# Patient Record
Sex: Male | Born: 1945 | Race: White | Hispanic: No | Marital: Married | Smoking: Never smoker
Health system: Southern US, Community
[De-identification: ages and names within clinical notes are randomized; demographics above are authoritative.]

## PROBLEM LIST (undated history)

## (undated) DIAGNOSIS — C801 Malignant (primary) neoplasm, unspecified: Secondary | ICD-10-CM

## (undated) DIAGNOSIS — F419 Anxiety disorder, unspecified: Secondary | ICD-10-CM

## (undated) DIAGNOSIS — G4730 Sleep apnea, unspecified: Secondary | ICD-10-CM

## (undated) DIAGNOSIS — I2089 Other forms of angina pectoris: Secondary | ICD-10-CM

## (undated) DIAGNOSIS — I2510 Atherosclerotic heart disease of native coronary artery without angina pectoris: Secondary | ICD-10-CM

## (undated) DIAGNOSIS — E669 Obesity, unspecified: Secondary | ICD-10-CM

## (undated) DIAGNOSIS — K859 Acute pancreatitis without necrosis or infection, unspecified: Secondary | ICD-10-CM

## (undated) DIAGNOSIS — K219 Gastro-esophageal reflux disease without esophagitis: Secondary | ICD-10-CM

## (undated) DIAGNOSIS — R911 Solitary pulmonary nodule: Secondary | ICD-10-CM

## (undated) DIAGNOSIS — T7840XA Allergy, unspecified, initial encounter: Secondary | ICD-10-CM

## (undated) DIAGNOSIS — I10 Essential (primary) hypertension: Secondary | ICD-10-CM

## (undated) DIAGNOSIS — G5600 Carpal tunnel syndrome, unspecified upper limb: Secondary | ICD-10-CM

## (undated) DIAGNOSIS — E785 Hyperlipidemia, unspecified: Secondary | ICD-10-CM

## (undated) DIAGNOSIS — I208 Other forms of angina pectoris: Secondary | ICD-10-CM

## (undated) HISTORY — DX: Essential (primary) hypertension: I10

## (undated) HISTORY — DX: Obesity, unspecified: E66.9

## (undated) HISTORY — PX: CARPAL TUNNEL RELEASE: SHX101

## (undated) HISTORY — PX: SHOULDER SURGERY: SHX246

## (undated) HISTORY — DX: Other forms of angina pectoris: I20.89

## (undated) HISTORY — DX: Hyperlipidemia, unspecified: E78.5

## (undated) HISTORY — DX: Atherosclerotic heart disease of native coronary artery without angina pectoris: I25.10

## (undated) HISTORY — PX: APPENDECTOMY: SHX54

## (undated) HISTORY — DX: Other forms of angina pectoris: I20.8

## (undated) HISTORY — DX: Allergy, unspecified, initial encounter: T78.40XA

## (undated) HISTORY — DX: Carpal tunnel syndrome, unspecified upper limb: G56.00

## (undated) HISTORY — PX: TONSILLECTOMY: SUR1361

## (undated) HISTORY — DX: Solitary pulmonary nodule: R91.1

---

## 2004-08-20 ENCOUNTER — Emergency Department: Payer: Self-pay | Admitting: Emergency Medicine

## 2004-09-08 ENCOUNTER — Ambulatory Visit: Payer: Self-pay | Admitting: Orthopaedic Surgery

## 2004-09-18 ENCOUNTER — Ambulatory Visit: Payer: Self-pay | Admitting: Orthopaedic Surgery

## 2009-07-08 ENCOUNTER — Ambulatory Visit: Payer: Self-pay | Admitting: General Surgery

## 2010-05-04 ENCOUNTER — Ambulatory Visit: Payer: Self-pay | Admitting: Otolaryngology

## 2010-05-12 ENCOUNTER — Ambulatory Visit: Payer: Self-pay | Admitting: Otolaryngology

## 2010-06-08 ENCOUNTER — Ambulatory Visit: Payer: Self-pay | Admitting: Internal Medicine

## 2012-03-10 ENCOUNTER — Emergency Department: Payer: Self-pay | Admitting: Emergency Medicine

## 2012-03-10 LAB — URINALYSIS, COMPLETE
Bacteria: NONE SEEN
Bilirubin,UR: NEGATIVE
Glucose,UR: NEGATIVE mg/dL (ref 0–75)
Hyaline Cast: 1
Leukocyte Esterase: NEGATIVE
Nitrite: NEGATIVE
Ph: 6 (ref 4.5–8.0)
Protein: NEGATIVE
RBC,UR: 2 /HPF (ref 0–5)
Specific Gravity: 1.011 (ref 1.003–1.030)
Squamous Epithelial: NONE SEEN
WBC UR: <1 /HPF (ref 0–5)

## 2012-03-10 LAB — CBC
HCT: 41.4 % (ref 40.0–52.0)
HGB: 14.6 g/dL (ref 13.0–18.0)
MCH: 33 pg (ref 26.0–34.0)
MCHC: 35.3 g/dL (ref 32.0–36.0)
MCV: 93 fL (ref 80–100)
Platelet: 274 10*3/uL (ref 150–440)
RBC: 4.44 10*6/uL (ref 4.40–5.90)
RDW: 14.5 % (ref 11.5–14.5)
WBC: 8.6 10*3/uL (ref 3.8–10.6)

## 2012-03-10 LAB — COMPREHENSIVE METABOLIC PANEL
Albumin: 4.2 g/dL (ref 3.4–5.0)
Alkaline Phosphatase: 74 U/L (ref 50–136)
Anion Gap: 11 (ref 7–16)
BUN: 14 mg/dL (ref 7–18)
Bilirubin,Total: 0.5 mg/dL (ref 0.2–1.0)
Calcium, Total: 9.4 mg/dL (ref 8.5–10.1)
Chloride: 96 mmol/L — ABNORMAL LOW (ref 98–107)
Co2: 25 mmol/L (ref 21–32)
Creatinine: 0.82 mg/dL (ref 0.60–1.30)
EGFR (African American): >60
EGFR (Non-African Amer.): >60
Glucose: 115 mg/dL — ABNORMAL HIGH (ref 65–99)
Osmolality: 266 (ref 275–301)
Potassium: 4.3 mmol/L (ref 3.5–5.1)
SGOT(AST): 30 U/L (ref 15–37)
SGPT (ALT): 18 U/L (ref 12–78)
Sodium: 132 mmol/L — ABNORMAL LOW (ref 136–145)
Total Protein: 8.4 g/dL — ABNORMAL HIGH (ref 6.4–8.2)

## 2012-03-10 LAB — TROPONIN I: Troponin-I: <0.02 ng/mL

## 2013-06-04 DIAGNOSIS — G4733 Obstructive sleep apnea (adult) (pediatric): Secondary | ICD-10-CM | POA: Diagnosis not present

## 2013-06-25 DIAGNOSIS — K859 Acute pancreatitis without necrosis or infection, unspecified: Secondary | ICD-10-CM

## 2013-06-25 HISTORY — DX: Acute pancreatitis without necrosis or infection, unspecified: K85.90

## 2013-07-10 DIAGNOSIS — Z789 Other specified health status: Secondary | ICD-10-CM | POA: Diagnosis not present

## 2013-07-10 DIAGNOSIS — I129 Hypertensive chronic kidney disease with stage 1 through stage 4 chronic kidney disease, or unspecified chronic kidney disease: Secondary | ICD-10-CM | POA: Diagnosis not present

## 2013-07-10 DIAGNOSIS — N181 Chronic kidney disease, stage 1: Secondary | ICD-10-CM | POA: Diagnosis not present

## 2013-07-10 DIAGNOSIS — J019 Acute sinusitis, unspecified: Secondary | ICD-10-CM | POA: Diagnosis not present

## 2013-09-26 ENCOUNTER — Inpatient Hospital Stay: Payer: Self-pay | Admitting: Internal Medicine

## 2013-09-26 DIAGNOSIS — R112 Nausea with vomiting, unspecified: Secondary | ICD-10-CM | POA: Diagnosis not present

## 2013-09-26 DIAGNOSIS — R109 Unspecified abdominal pain: Secondary | ICD-10-CM | POA: Diagnosis not present

## 2013-09-26 DIAGNOSIS — N179 Acute kidney failure, unspecified: Secondary | ICD-10-CM | POA: Diagnosis not present

## 2013-09-26 DIAGNOSIS — K859 Acute pancreatitis without necrosis or infection, unspecified: Secondary | ICD-10-CM | POA: Diagnosis not present

## 2013-09-26 DIAGNOSIS — R1084 Generalized abdominal pain: Secondary | ICD-10-CM | POA: Diagnosis not present

## 2013-09-26 DIAGNOSIS — I10 Essential (primary) hypertension: Secondary | ICD-10-CM | POA: Diagnosis not present

## 2013-09-26 LAB — CBC WITH DIFFERENTIAL/PLATELET
Basophil #: 0 10*3/uL (ref 0.0–0.1)
Basophil %: 0.2 %
Eosinophil #: 0 10*3/uL (ref 0.0–0.7)
Eosinophil %: 0.1 %
HCT: 44.2 % (ref 40.0–52.0)
HGB: 15.1 g/dL (ref 13.0–18.0)
Lymphocyte #: 0.2 10*3/uL — ABNORMAL LOW (ref 1.0–3.6)
Lymphocyte %: 1.1 %
MCH: 32.7 pg (ref 26.0–34.0)
MCHC: 34.2 g/dL (ref 32.0–36.0)
MCV: 96 fL (ref 80–100)
Monocyte #: 0.6 x10 3/mm (ref 0.2–1.0)
Monocyte %: 4.3 %
Neutrophil #: 14.1 10*3/uL — ABNORMAL HIGH (ref 1.4–6.5)
Neutrophil %: 94.3 %
Platelet: 143 10*3/uL — ABNORMAL LOW (ref 150–440)
RBC: 4.62 10*6/uL (ref 4.40–5.90)
RDW: 15.3 % — ABNORMAL HIGH (ref 11.5–14.5)
WBC: 14.9 10*3/uL — ABNORMAL HIGH (ref 3.8–10.6)

## 2013-09-26 LAB — COMPREHENSIVE METABOLIC PANEL
Albumin: 4 g/dL (ref 3.4–5.0)
Alkaline Phosphatase: 69 U/L
Anion Gap: 16 (ref 7–16)
BUN: 21 mg/dL — ABNORMAL HIGH (ref 7–18)
Bilirubin,Total: 0.8 mg/dL (ref 0.2–1.0)
Calcium, Total: 8.7 mg/dL (ref 8.5–10.1)
Chloride: 97 mmol/L — ABNORMAL LOW (ref 98–107)
Co2: 19 mmol/L — ABNORMAL LOW (ref 21–32)
Creatinine: 1.42 mg/dL — ABNORMAL HIGH (ref 0.60–1.30)
EGFR (African American): 60 — ABNORMAL LOW
EGFR (Non-African Amer.): 51 — ABNORMAL LOW
Glucose: 143 mg/dL — ABNORMAL HIGH (ref 65–99)
Osmolality: 270 (ref 275–301)
Potassium: 3.8 mmol/L (ref 3.5–5.1)
SGOT(AST): 50 U/L — ABNORMAL HIGH (ref 15–37)
SGPT (ALT): 22 U/L (ref 12–78)
Sodium: 132 mmol/L — ABNORMAL LOW (ref 136–145)
Total Protein: 8.3 g/dL — ABNORMAL HIGH (ref 6.4–8.2)

## 2013-09-26 LAB — TROPONIN I: Troponin-I: <0.02 ng/mL

## 2013-09-26 LAB — LIPASE, BLOOD: Lipase: 1926 U/L — ABNORMAL HIGH (ref 73–393)

## 2013-09-27 DIAGNOSIS — N179 Acute kidney failure, unspecified: Secondary | ICD-10-CM | POA: Diagnosis not present

## 2013-09-27 DIAGNOSIS — I10 Essential (primary) hypertension: Secondary | ICD-10-CM | POA: Diagnosis not present

## 2013-09-27 DIAGNOSIS — K859 Acute pancreatitis without necrosis or infection, unspecified: Secondary | ICD-10-CM | POA: Diagnosis not present

## 2013-09-27 LAB — BASIC METABOLIC PANEL
Anion Gap: 8 (ref 7–16)
BUN: 16 mg/dL (ref 7–18)
Calcium, Total: 7.6 mg/dL — ABNORMAL LOW (ref 8.5–10.1)
Chloride: 100 mmol/L (ref 98–107)
Co2: 26 mmol/L (ref 21–32)
Creatinine: 1 mg/dL (ref 0.60–1.30)
EGFR (African American): >60
EGFR (Non-African Amer.): >60
Glucose: 144 mg/dL — ABNORMAL HIGH (ref 65–99)
Osmolality: 272 (ref 275–301)
Potassium: 3.6 mmol/L (ref 3.5–5.1)
Sodium: 134 mmol/L — ABNORMAL LOW (ref 136–145)

## 2013-09-27 LAB — LIPID PANEL
Cholesterol: 83 mg/dL (ref 0–200)
HDL Cholesterol: 28 mg/dL — ABNORMAL LOW (ref 40–60)
Ldl Cholesterol, Calc: 17 mg/dL (ref 0–100)
Triglycerides: 188 mg/dL (ref 0–200)
VLDL Cholesterol, Calc: 38 mg/dL (ref 5–40)

## 2013-09-27 LAB — CBC WITH DIFFERENTIAL/PLATELET
Basophil #: 0 10*3/uL (ref 0.0–0.1)
Basophil %: 0.1 %
Eosinophil #: 0 10*3/uL (ref 0.0–0.7)
Eosinophil %: 0.1 %
HCT: 39.6 % — ABNORMAL LOW (ref 40.0–52.0)
HGB: 13.8 g/dL (ref 13.0–18.0)
Lymphocyte #: 0.3 10*3/uL — ABNORMAL LOW (ref 1.0–3.6)
Lymphocyte %: 2 %
MCH: 33 pg (ref 26.0–34.0)
MCHC: 34.8 g/dL (ref 32.0–36.0)
MCV: 95 fL (ref 80–100)
Monocyte #: 0.5 x10 3/mm (ref 0.2–1.0)
Monocyte %: 3.4 %
Neutrophil #: 13.8 10*3/uL — ABNORMAL HIGH (ref 1.4–6.5)
Neutrophil %: 94.4 %
Platelet: 143 10*3/uL — ABNORMAL LOW (ref 150–440)
RBC: 4.18 10*6/uL — ABNORMAL LOW (ref 4.40–5.90)
RDW: 15.1 % — ABNORMAL HIGH (ref 11.5–14.5)
WBC: 14.6 10*3/uL — ABNORMAL HIGH (ref 3.8–10.6)

## 2013-09-27 LAB — LIPASE, BLOOD: Lipase: 589 U/L — ABNORMAL HIGH (ref 73–393)

## 2013-09-28 DIAGNOSIS — N179 Acute kidney failure, unspecified: Secondary | ICD-10-CM | POA: Diagnosis not present

## 2013-09-28 DIAGNOSIS — R112 Nausea with vomiting, unspecified: Secondary | ICD-10-CM | POA: Diagnosis not present

## 2013-09-28 DIAGNOSIS — K859 Acute pancreatitis without necrosis or infection, unspecified: Secondary | ICD-10-CM | POA: Diagnosis not present

## 2013-09-28 DIAGNOSIS — I10 Essential (primary) hypertension: Secondary | ICD-10-CM | POA: Diagnosis not present

## 2013-09-28 LAB — LIPASE, BLOOD: Lipase: 181 U/L (ref 73–393)

## 2013-10-20 DIAGNOSIS — K859 Acute pancreatitis without necrosis or infection, unspecified: Secondary | ICD-10-CM | POA: Diagnosis not present

## 2013-10-20 DIAGNOSIS — Z1389 Encounter for screening for other disorder: Secondary | ICD-10-CM | POA: Diagnosis not present

## 2013-10-20 DIAGNOSIS — K5732 Diverticulitis of large intestine without perforation or abscess without bleeding: Secondary | ICD-10-CM | POA: Diagnosis not present

## 2013-10-28 DIAGNOSIS — K5732 Diverticulitis of large intestine without perforation or abscess without bleeding: Secondary | ICD-10-CM | POA: Diagnosis not present

## 2013-10-28 DIAGNOSIS — I10 Essential (primary) hypertension: Secondary | ICD-10-CM | POA: Diagnosis not present

## 2013-10-28 DIAGNOSIS — K859 Acute pancreatitis without necrosis or infection, unspecified: Secondary | ICD-10-CM | POA: Diagnosis not present

## 2013-12-10 DIAGNOSIS — J209 Acute bronchitis, unspecified: Secondary | ICD-10-CM | POA: Diagnosis not present

## 2013-12-11 ENCOUNTER — Ambulatory Visit (INDEPENDENT_AMBULATORY_CARE_PROVIDER_SITE_OTHER): Payer: Medicare Other | Admitting: Podiatry

## 2013-12-11 ENCOUNTER — Other Ambulatory Visit: Payer: Self-pay | Admitting: *Deleted

## 2013-12-11 ENCOUNTER — Ambulatory Visit (INDEPENDENT_AMBULATORY_CARE_PROVIDER_SITE_OTHER): Payer: Medicare Other

## 2013-12-11 ENCOUNTER — Encounter: Payer: Self-pay | Admitting: Podiatry

## 2013-12-11 VITALS — BP 131/76 | HR 65 | Resp 16 | Ht 66.0 in | Wt 200.0 lb

## 2013-12-11 DIAGNOSIS — M722 Plantar fascial fibromatosis: Secondary | ICD-10-CM | POA: Diagnosis not present

## 2013-12-11 NOTE — Patient Instructions (Signed)

## 2013-12-11 NOTE — Progress Notes (Signed)
   Subjective:    Patient ID: Daniel Holden, male    DOB: 12-Dec-1945, 68 y.o.   MRN: [id number]  HPI Comments: Been getting a pin prick feeling at the bottom of the left heel, it does not happen all the time, and it seems to happen when going to sit down and turn the foot a certain way.   Foot Pain      Review of Systems  All other systems reviewed and are negative.      Objective:   Physical Exam        Assessment & Plan:

## 2013-12-11 NOTE — Progress Notes (Signed)
Subjective:     Patient ID: Daniel Holden, male   DOB: 24-Dec-1945, 68 y.o.   MRN: [id number]  Foot Pain   patient presents stating I have had some sharp pains in the bottom of my left heel that feels like a pinprick and I am worried that this may be heel pain or it may be related to my back   Review of Systems  All other systems reviewed and are negative.      Objective:   Physical Exam  Nursing note and vitals reviewed. Constitutional: He is oriented to person, place, and time.  Cardiovascular: Intact distal pulses.   Musculoskeletal: Normal range of motion.  Neurological: He is oriented to person, place, and time.  Skin: Skin is warm.   neurovascular status found to be intact with range of motion subtalar midtarsal joint adequate and no muscle strength loss noted in either foot. I was unable to elicit discomfort in the plantar left heel and I did find the digits are well-perfused and it DTR reflexes are intact    Assessment:     Possible low grade plantar fasciitis left versus possibility for mild impingement in the lower back creating symptoms    Plan:     H&P and x-ray reviewed. I do think at this time we will treat conservatively with supportive shoe gear usage and stretching exercises and if symptoms persist and he continues to be a sharp burning-type pain I want him to get an MRI of his lower back to rule out any kind of nerve compression. Reappoint as needed

## 2014-01-21 DIAGNOSIS — G4733 Obstructive sleep apnea (adult) (pediatric): Secondary | ICD-10-CM | POA: Diagnosis not present

## 2014-01-21 DIAGNOSIS — H905 Unspecified sensorineural hearing loss: Secondary | ICD-10-CM | POA: Diagnosis not present

## 2014-01-21 DIAGNOSIS — H903 Sensorineural hearing loss, bilateral: Secondary | ICD-10-CM | POA: Diagnosis not present

## 2014-01-21 DIAGNOSIS — H6120 Impacted cerumen, unspecified ear: Secondary | ICD-10-CM | POA: Diagnosis not present

## 2014-06-03 DIAGNOSIS — K5732 Diverticulitis of large intestine without perforation or abscess without bleeding: Secondary | ICD-10-CM | POA: Diagnosis not present

## 2014-06-03 DIAGNOSIS — Z1389 Encounter for screening for other disorder: Secondary | ICD-10-CM | POA: Diagnosis not present

## 2014-06-29 DIAGNOSIS — M5127 Other intervertebral disc displacement, lumbosacral region: Secondary | ICD-10-CM | POA: Diagnosis not present

## 2014-06-29 DIAGNOSIS — M549 Dorsalgia, unspecified: Secondary | ICD-10-CM | POA: Diagnosis not present

## 2014-06-29 DIAGNOSIS — M539 Dorsopathy, unspecified: Secondary | ICD-10-CM | POA: Diagnosis not present

## 2014-09-10 DIAGNOSIS — M25562 Pain in left knee: Secondary | ICD-10-CM | POA: Diagnosis not present

## 2014-09-10 DIAGNOSIS — I739 Peripheral vascular disease, unspecified: Secondary | ICD-10-CM | POA: Diagnosis not present

## 2014-09-21 DIAGNOSIS — M25562 Pain in left knee: Secondary | ICD-10-CM | POA: Diagnosis not present

## 2014-10-16 NOTE — Discharge Summary (Signed)
PATIENT NAME:  Daniel Holden, Daniel Holden MR#:  [id number] DATE OF BIRTH:  [birth year]  DATE OF ADMISSION:  09/26/2013 DATE OF DISCHARGE:  09/28/2013  ADMITTING DIAGNOSIS: Abdominal pain, nausea, vomiting,   DISCHARGE DIAGNOSES: 1. Abdominal pain, nausea, vomiting due to acute pancreatitis likely alcohol induced, HCTZ therapy could also be the cause. The patient's ultrasound of the abdomen was nonrevealing.  2. Alcohol abuse. The patient counseled regarding cessation of alcohol abuse.  3. Hypertension.  4. Acute kidney injury: Likely due to dehydration, resolved with IV fluids.  5. Hyponatremia, likely due to dehydration, improved with IV fluids.  6. Leukocytosis, likely related to pancreatitis.   CONSULTANTS: None.   PERTINENT LABORATORIES AND EVALUATIONS: Admitting glucose 143, BUN 21, creatinine 1.42, sodium 132, potassium 3.8, chloride 97. CO2 is 19, calcium was 8.6. Lipase was 1926 on admission. Discharge was 181. LFTs were normal except slightly elevated AST of 50, troponin less than 0.02. WBC 14.9, hemoglobin 15.1, platelet count was 142. EKG normal sinus rhythm with possible left atrial enlargement. Ultrasound of the abdomen showed increased liver echogenicity probably indicating fatty changes with no focal liver lesions identified.   HOSPITAL COURSE: Please refer to H and P done by the admitting physician. The patient is a 69 year old white male with history of alcohol abuse, presented with complaint of acute onset of abdominal pain. The patient was admitted and underwent a work-up with ultrasound, which was negative for any abnormality in his gallbladder of his ducts. The patient's was kept n.p.o. for acute pancreatitis. By day two his pain was improved. By day three his pain resolved.  His lipase was normalized,  his diet was advanced and his symptoms resolved. At this point, he is doing much better and tolerating diet and is stable for discharge. He also was on HCTZ and enalapril, which are both  known to cause pancreatitis, HCTZ more than enalapril, therefore, HCTZ has been discontinued at this point. His blood pressure will need to be monitored and his primary care provider needs to adjust his regimen as felt appropriate. At this time, he is stable for discharge.   DISCHARGE MEDICATIONS: Metoprolol tartrate 50 mg 1 tab p.o. b.i.d., Centrum Silver 1 tab p.o. daily, Xanax 0.25 1 tab p.o. t.i.d. as needed for anxiety, vitamin D3 1000 international units daily, enalapril 20 daily, meclizine 25 mg 1 tab p.o. t.i.d., Tylenol 650 q.4 p.r.n. for pain, omeprazole 20 daily.   DIET: Low-sodium, low-fat, low-cholesterol.   ACTIVITY: As tolerated.   FOLLOW-UP: With Dr. Rebecka Apley in 1 to 2 weeks.   TIME SPENT: 35 minutes on this patient.   ____________________________ Lafonda Mosses. Posey Pronto, MD shp:sg D: 09/29/2013 11:13:24 ET T: 09/29/2013 11:41:43 ET JOB#: [id number]  cc: Ellinor Test H. Posey Pronto, MD, <Dictator> Alric Seton MD ELECTRONICALLY SIGNED 10/07/2013 15:37

## 2014-10-16 NOTE — H&P (Signed)
PATIENT NAME:  Daniel Holden, Daniel Holden MR#:  [id number] DATE OF BIRTH:  1945-08-21  DATE OF ADMISSION:  09/26/2013  PRIMARY CARE PHYSICIAN: Dr. Lavera Guise.  CHIEF COMPLAINT: Abdominal pain, nausea and vomiting.   HISTORY OF PRESENT ILLNESS: This is a 69 year old male who presents to the hospital with sudden onset of abdominal pain, nausea, vomiting that began this morning. He describes the pain as located in the upper part of his abdomen, nonradiating, associated with some vomiting. The patient attempt to take some Pepto-Bismol, but it did not improve his symptoms. He actually vomited out his Pepto-Bismol prior to coming into the Emergency Room. The patient also had 2 episodes of vomiting while being here, which has been consistent with the Pepto-Bismol he drank. The patient denies any fevers or chills, any chest pain, shortness of breath, or any other associated symptoms presently. The patient presented to the Emergency Room and was noted to have a lipase greater than 1900 and diagnosed with acute pancreatitis. Hospitalist services were contacted for further treatment and evaluation.   REVIEW OF SYSTEMS:    CONSTITUTIONAL: No documented fever. No weight gain. No weight loss.  EYES: No blurred or double vision.  EARS, NOSE, THROAT: No tinnitus. No postnasal drip. No redness of the oropharynx.  RESPIRATORY: No cough, no wheeze, no hemoptysis, no dyspnea.  CARDIOVASCULAR: No chest pain, no orthopnea, no palpitations, no syncope.  GASTROINTESTINAL: Positive nausea. Positive vomiting. Positive upper abdominal pain. No melena or hematochezia.  GENITOURINARY: No dysuria or hematuria.  ENDOCRINE: No polyuria, nocturia, heat or cold intolerance. HEMATOLOGIC: No anemia, no bruising, no bleeding.  INTEGUMENTARY: No rashes, no lesions.  MUSCULOSKELETAL: No arthritis. No swelling. No gout.  NEUROLOGIC: No numbness. No tingling. No ataxia. No seizure activity.  PSYCHIATRIC: No anxiety. No insomnia. No ADD.   PAST  MEDICAL HISTORY: Consistent with hypertension.   ALLERGIES: No known drug allergies.   SOCIAL HISTORY: No smoking. Does drink about 3 to 4 beers daily. No illicit drug abuse. Lives with his wife at home.   FAMILY HISTORY: The patient's mother is alive; she is healthy. Father died; he did have a history of asbestosis.   CURRENT MEDICATIONS: Metoprolol tartrate 50 mg b.i.d., hydrochlorothiazide 12.5 mg daily, enalapril 20 mg b.i.d., and Nexium 40 mg daily.   PHYSICAL EXAMINATION: Presently is as follows:  VITAL SIGNS: Temperature 98.1, pulse 95, respirations 30, blood pressure 208/100, sats 97% on room air.  GENERAL: He is a pleasant-appearing male in mild distress.  HEENT: Atraumatic, normocephalic. Extraocular muscles are intact. Pupils equal and reactive to light. Sclerae anicteric. No conjunctival injection. No pharyngeal erythema.  NECK: Supple. There is no jugular venous distention. No bruits, no lymphadenopathy, no thyromegaly.  HEART: Regular rate and rhythm. No murmurs, no rubs, no clicks.  LUNGS: Clear to auscultation bilaterally. No rales, no rhonchi, no wheezes.  ABDOMEN: Soft, flat. Tender in the upper part of the abdomen. No rebound, no rigidity. Hypoactive bowel sounds. No hepatosplenomegaly appreciated.  EXTREMITIES: No evidence of any cyanosis, clubbing, or peripheral edema. Has +2 pedal and radial pulses bilaterally.  NEUROLOGICAL: The patient is alert, awake, and oriented x 3 with no focal motor or sensory deficits appreciated bilaterally.  SKIN: Moist and warm with no rashes appreciated.  LYMPHATIC: There is no cervical or axillary lymphadenopathy.   LABORATORY AND RADIOLOGICAL DATA: Serum glucose of 143, BUN 21, creatinine 1.4, sodium 132, potassium 3.8, chloride 97, bicarbonate 19. Lipase is 1926. LFTs are within normal limits. Troponin less than 0.02. White cell count  14.9, hemoglobin 15.1, hematocrit 44.2, platelet count 143.   The patient did have an abdominal  ultrasound done, which showed some hepatic steatosis but no other biliary pathology.   ASSESSMENT AND PLAN: This is a 69 year old male with history of hypertension, presents to the hospital with abdominal pain, nausea, vomiting and noted to have acute pancreatitis.  1.  Acute pancreatitis: The exact etiology of this is unclear. Suspected to be alcohol abuse, as the patient does not about 3 to 4 beers daily, but that is not a very have dose of alcohol. He has not had any binge drinking in the past few weeks. His abdominal ultrasound shows no evidence of acute biliary process. I will go ahead and check a triglyceride level. The patient is also on HCTZ and enalapril, therefore, some concern if those 2 medications are possibly causing pancreatitis, but he has been on those for many years. For now, I will continue supportive care with IV fluids, antiemetics, pain control with IV Dilaudid. Follow him clinically. Follow his lipase. 2.  Acute renal failure: This is likely secondary to acute pancreatitis and volume depletion. I will hydrate him with IV fluids. Follow BUN and creatinine and urine output. Hold his ACE inhibitor and HCTZ for now.  3.  Leukocytosis: Likely stress margination from his acute pancreatitis. Follow his white cell count. No acute infectious source. 4.  Hypertension. His blood pressure is significantly uncontrolled. Some of this could be related to underlying abdominal pain. I will continue his metoprolol for now. Add some p.r.n. hydralazine. Hold his HCTZ and ACE given his renal failure and possible underlying acute pancreatitis.   CODE STATUS: The patient is a full code.   TIME SPENT ON ADMISSION: 50 minutes.   ____________________________ Belia Heman. [name], MD vjs:jcm D: 09/26/2013 18:13:51 ET T: 09/26/2013 19:06:27 ET JOB#: [id number]  cc: Belia Heman. [name], MD, <Dictator> Henreitta Leber MD ELECTRONICALLY SIGNED 10/04/2013 18:45

## 2014-11-08 DIAGNOSIS — I10 Essential (primary) hypertension: Secondary | ICD-10-CM | POA: Diagnosis not present

## 2014-11-08 DIAGNOSIS — K2900 Acute gastritis without bleeding: Secondary | ICD-10-CM | POA: Diagnosis not present

## 2014-11-08 DIAGNOSIS — K5732 Diverticulitis of large intestine without perforation or abscess without bleeding: Secondary | ICD-10-CM | POA: Diagnosis not present

## 2014-11-24 ENCOUNTER — Other Ambulatory Visit: Payer: Self-pay | Admitting: Orthopedic Surgery

## 2014-11-24 DIAGNOSIS — M25562 Pain in left knee: Secondary | ICD-10-CM

## 2014-11-30 ENCOUNTER — Ambulatory Visit: Payer: Self-pay

## 2014-12-01 ENCOUNTER — Ambulatory Visit
Admission: RE | Admit: 2014-12-01 | Discharge: 2014-12-01 | Disposition: A | Discharge status: Home / Self Care | Payer: Medicare Other | Source: Ambulatory Visit | Attending: Orthopedic Surgery | Admitting: Orthopedic Surgery

## 2014-12-01 DIAGNOSIS — M25562 Pain in left knee: Secondary | ICD-10-CM | POA: Diagnosis present

## 2014-12-01 DIAGNOSIS — M23322 Other meniscus derangements, posterior horn of medial meniscus, left knee: Secondary | ICD-10-CM | POA: Diagnosis not present

## 2014-12-01 DIAGNOSIS — M7122 Synovial cyst of popliteal space [Baker], left knee: Secondary | ICD-10-CM | POA: Insufficient documentation

## 2014-12-01 DIAGNOSIS — M1712 Unilateral primary osteoarthritis, left knee: Secondary | ICD-10-CM | POA: Diagnosis not present

## 2014-12-01 DIAGNOSIS — M25462 Effusion, left knee: Secondary | ICD-10-CM | POA: Diagnosis not present

## 2014-12-01 DIAGNOSIS — S83242A Other tear of medial meniscus, current injury, left knee, initial encounter: Secondary | ICD-10-CM | POA: Diagnosis not present

## 2014-12-01 DIAGNOSIS — M7642 Tibial collateral bursitis [Pellegrini-Stieda], left leg: Secondary | ICD-10-CM | POA: Insufficient documentation

## 2014-12-06 DIAGNOSIS — M25562 Pain in left knee: Secondary | ICD-10-CM | POA: Diagnosis not present

## 2015-07-15 DIAGNOSIS — G4733 Obstructive sleep apnea (adult) (pediatric): Secondary | ICD-10-CM | POA: Diagnosis not present

## 2015-07-15 DIAGNOSIS — E669 Obesity, unspecified: Secondary | ICD-10-CM | POA: Diagnosis not present

## 2015-07-15 DIAGNOSIS — H903 Sensorineural hearing loss, bilateral: Secondary | ICD-10-CM | POA: Diagnosis not present

## 2015-07-15 DIAGNOSIS — J301 Allergic rhinitis due to pollen: Secondary | ICD-10-CM | POA: Diagnosis not present

## 2015-07-29 DIAGNOSIS — R6889 Other general symptoms and signs: Secondary | ICD-10-CM | POA: Diagnosis not present

## 2015-07-29 DIAGNOSIS — Z79899 Other long term (current) drug therapy: Secondary | ICD-10-CM | POA: Diagnosis not present

## 2015-07-29 DIAGNOSIS — Z1329 Encounter for screening for other suspected endocrine disorder: Secondary | ICD-10-CM | POA: Diagnosis not present

## 2015-07-29 DIAGNOSIS — E789 Disorder of lipoprotein metabolism, unspecified: Secondary | ICD-10-CM | POA: Diagnosis not present

## 2016-02-07 DIAGNOSIS — M25512 Pain in left shoulder: Secondary | ICD-10-CM | POA: Diagnosis not present

## 2016-02-09 DIAGNOSIS — I10 Essential (primary) hypertension: Secondary | ICD-10-CM | POA: Diagnosis not present

## 2016-02-09 DIAGNOSIS — K5732 Diverticulitis of large intestine without perforation or abscess without bleeding: Secondary | ICD-10-CM | POA: Diagnosis not present

## 2016-02-09 DIAGNOSIS — G4733 Obstructive sleep apnea (adult) (pediatric): Secondary | ICD-10-CM | POA: Diagnosis not present

## 2016-02-09 DIAGNOSIS — M67912 Unspecified disorder of synovium and tendon, left shoulder: Secondary | ICD-10-CM | POA: Diagnosis not present

## 2016-02-22 DIAGNOSIS — M7552 Bursitis of left shoulder: Secondary | ICD-10-CM | POA: Diagnosis not present

## 2016-02-22 DIAGNOSIS — M67922 Unspecified disorder of synovium and tendon, left upper arm: Secondary | ICD-10-CM | POA: Diagnosis not present

## 2016-02-22 DIAGNOSIS — M25512 Pain in left shoulder: Secondary | ICD-10-CM | POA: Diagnosis not present

## 2016-02-22 DIAGNOSIS — M19012 Primary osteoarthritis, left shoulder: Secondary | ICD-10-CM | POA: Diagnosis not present

## 2016-02-22 DIAGNOSIS — S43492A Other sprain of left shoulder joint, initial encounter: Secondary | ICD-10-CM | POA: Diagnosis not present

## 2016-02-22 DIAGNOSIS — M7582 Other shoulder lesions, left shoulder: Secondary | ICD-10-CM | POA: Diagnosis not present

## 2016-03-13 DIAGNOSIS — S43432D Superior glenoid labrum lesion of left shoulder, subsequent encounter: Secondary | ICD-10-CM | POA: Diagnosis not present

## 2016-03-13 DIAGNOSIS — M7542 Impingement syndrome of left shoulder: Secondary | ICD-10-CM | POA: Diagnosis not present

## 2016-03-13 DIAGNOSIS — M19019 Primary osteoarthritis, unspecified shoulder: Secondary | ICD-10-CM | POA: Diagnosis not present

## 2016-03-19 DIAGNOSIS — Z23 Encounter for immunization: Secondary | ICD-10-CM | POA: Diagnosis not present

## 2016-06-09 ENCOUNTER — Encounter: Payer: Self-pay | Admitting: Emergency Medicine

## 2016-06-09 ENCOUNTER — Emergency Department
Admission: EM | Admit: 2016-06-09 | Discharge: 2016-06-09 | Disposition: A | Discharge status: Home / Self Care | Payer: Medicare Other | Attending: Emergency Medicine | Admitting: Emergency Medicine

## 2016-06-09 DIAGNOSIS — E876 Hypokalemia: Secondary | ICD-10-CM | POA: Diagnosis not present

## 2016-06-09 DIAGNOSIS — I10 Essential (primary) hypertension: Secondary | ICD-10-CM | POA: Insufficient documentation

## 2016-06-09 DIAGNOSIS — Z79899 Other long term (current) drug therapy: Secondary | ICD-10-CM | POA: Diagnosis not present

## 2016-06-09 DIAGNOSIS — K852 Alcohol induced acute pancreatitis without necrosis or infection: Secondary | ICD-10-CM | POA: Diagnosis not present

## 2016-06-09 DIAGNOSIS — R1013 Epigastric pain: Secondary | ICD-10-CM | POA: Diagnosis not present

## 2016-06-09 DIAGNOSIS — R109 Unspecified abdominal pain: Secondary | ICD-10-CM | POA: Diagnosis not present

## 2016-06-09 HISTORY — DX: Acute pancreatitis without necrosis or infection, unspecified: K85.90

## 2016-06-09 LAB — CBC WITH DIFFERENTIAL/PLATELET
Basophils Absolute: 0 10*3/uL (ref 0–0.1)
Basophils Relative: 0 %
Eosinophils Absolute: 0 10*3/uL (ref 0–0.7)
Eosinophils Relative: 0 %
HCT: 38.4 % — ABNORMAL LOW (ref 40.0–52.0)
Hemoglobin: 13.9 g/dL (ref 13.0–18.0)
Lymphocytes Relative: 2 %
Lymphs Abs: 0.2 10*3/uL — ABNORMAL LOW (ref 1.0–3.6)
MCH: 35.4 pg — ABNORMAL HIGH (ref 26.0–34.0)
MCHC: 36.3 g/dL — ABNORMAL HIGH (ref 32.0–36.0)
MCV: 97.6 fL (ref 80.0–100.0)
Monocytes Absolute: 0.5 10*3/uL (ref 0.2–1.0)
Monocytes Relative: 5 %
Neutro Abs: 9.2 10*3/uL — ABNORMAL HIGH (ref 1.4–6.5)
Neutrophils Relative %: 93 %
Platelets: 174 10*3/uL (ref 150–440)
RBC: 3.94 MIL/uL — ABNORMAL LOW (ref 4.40–5.90)
RDW: 12.3 % (ref 11.5–14.5)
WBC: 9.9 10*3/uL (ref 3.8–10.6)

## 2016-06-09 LAB — ETHANOL: Alcohol, Ethyl (B): <5 mg/dL (ref <5)

## 2016-06-09 LAB — TROPONIN I: Troponin I: <0.03 ng/mL (ref <0.03)

## 2016-06-09 LAB — COMPREHENSIVE METABOLIC PANEL
ALT: 12 U/L — ABNORMAL LOW (ref 17–63)
AST: 38 U/L (ref 15–41)
Albumin: 4.5 g/dL (ref 3.5–5.0)
Alkaline Phosphatase: 41 U/L (ref 38–126)
Anion gap: 12 (ref 5–15)
BUN: 18 mg/dL (ref 6–20)
CO2: 23 mmol/L (ref 22–32)
Calcium: 8.9 mg/dL (ref 8.9–10.3)
Chloride: 97 mmol/L — ABNORMAL LOW (ref 101–111)
Creatinine, Ser: 0.92 mg/dL (ref 0.61–1.24)
GFR calc Af Amer: >60 mL/min (ref >60)
GFR calc non Af Amer: >60 mL/min (ref >60)
Glucose, Bld: 166 mg/dL — ABNORMAL HIGH (ref 65–99)
Potassium: 3.1 mmol/L — ABNORMAL LOW (ref 3.5–5.1)
Sodium: 132 mmol/L — ABNORMAL LOW (ref 135–145)
Total Bilirubin: 1.5 mg/dL — ABNORMAL HIGH (ref 0.3–1.2)
Total Protein: 7.9 g/dL (ref 6.5–8.1)

## 2016-06-09 LAB — LIPASE, BLOOD: Lipase: 80 U/L — ABNORMAL HIGH (ref 11–51)

## 2016-06-09 MED ORDER — OXYCODONE-ACETAMINOPHEN 5-325 MG PO TABS: 1.0000 | ORAL_TABLET | ORAL | 0 refills | Status: DC | PRN

## 2016-06-09 MED ORDER — MORPHINE SULFATE (PF) 4 MG/ML IV SOLN
4.0000 mg | Freq: Once | INTRAVENOUS | Status: AC
  Administered 2016-06-09: 4 mg via INTRAVENOUS
  Filled 2016-06-09: qty 1

## 2016-06-09 MED ORDER — OXYCODONE HCL 5 MG PO TABS
5.0000 mg | ORAL_TABLET | Freq: Once | ORAL | Status: AC
  Administered 2016-06-09: 5 mg via ORAL
  Filled 2016-06-09: qty 1

## 2016-06-09 MED ORDER — POTASSIUM CHLORIDE CRYS ER 20 MEQ PO TBCR
40.0000 meq | EXTENDED_RELEASE_TABLET | Freq: Once | ORAL | Status: AC
  Administered 2016-06-09: 40 meq via ORAL
  Filled 2016-06-09: qty 2

## 2016-06-09 MED ORDER — FAMOTIDINE IN NACL 20-0.9 MG/50ML-% IV SOLN
20.0000 mg | Freq: Once | INTRAVENOUS | Status: AC
  Administered 2016-06-09: 20 mg via INTRAVENOUS
  Filled 2016-06-09: qty 50

## 2016-06-09 MED ORDER — ACETAMINOPHEN 500 MG PO TABS
1000.0000 mg | ORAL_TABLET | Freq: Once | ORAL | Status: AC
  Administered 2016-06-09: 1000 mg via ORAL
  Filled 2016-06-09: qty 2

## 2016-06-09 MED ORDER — OXYCODONE HCL 5 MG PO TABS: 5.0000 mg | ORAL_TABLET | ORAL | 0 refills | Status: AC | PRN

## 2016-06-09 MED ORDER — ONDANSETRON 4 MG PO TBDP: 4.0000 mg | ORAL_TABLET | Freq: Three times a day (TID) | ORAL | 0 refills | Status: DC | PRN

## 2016-06-09 MED ORDER — ONDANSETRON HCL 4 MG/2ML IJ SOLN
4.0000 mg | Freq: Once | INTRAMUSCULAR | Status: AC
  Administered 2016-06-09: 4 mg via INTRAVENOUS
  Filled 2016-06-09: qty 2

## 2016-06-09 MED ORDER — SODIUM CHLORIDE 0.9 % IV BOLUS (SEPSIS)
1000.0000 mL | Freq: Once | INTRAVENOUS | Status: AC
  Administered 2016-06-09: 1000 mL via INTRAVENOUS

## 2016-06-09 NOTE — ED Provider Notes (Signed)
-----------------------------------------   11:24 AM on 06/09/2016 -----------------------------------------   Blood pressure (!) 165/91, pulse 62, temperature 98.6 F (37 C), temperature source Oral, resp. rate 15, height 5\' 6"  (1.676 m), weight 185 lb (83.9 kg), SpO2 96 %.  Assuming care from Dr. Beather Arbour [facility] is a 70 y.o. male with a chief complaint of Abdominal Pain .    In summary, 70 year old male with a history of alcohol abuse who presented with abdominal pain and was found to have pancreatitis. Plan is to make sure patient is tolerating by mouth and pain is well controlled and discharged home on supportive care.  Patient has been started on by mouth meds, is tolerating by mouth. Pain is currently mild. He is to be discharged home on standing Tylenol 1000 mg every 8 hours, oxycodone 5 mg every 6 hours when necessary, Zofran when necessary, and slowly advancing his diet. Discussed return precautions with patient and close follow-up with PCP on Monday. Patient and wife are comfortable with the plan.   Rudene Re, MD 06/09/16 1126

## 2016-06-09 NOTE — Discharge Instructions (Addendum)
1. Pain control: Take tylenol 1000mg  every 8 hours. Take 5mg  of oxycodone every 6 hours for breakthrough pain. If you need the oxycodone make sure to take one senokot as well to prevent constipation.  Do not drink alcohol, drive or participate in any other potentially dangerous activities while taking this medication as it may make you sleepy. Do not take this medication with any other sedating medications, either prescription or over-the-counter.  2. Solid low fat diet until pain is resolved, then advance as tolerated  3. Return to the ER for worsening symptoms, persistent vomiting, difficulty breathing or other concerns.

## 2016-06-09 NOTE — ED Notes (Signed)
Pt verbalized understanding of discharge instructions. NAD at this time. 

## 2016-06-09 NOTE — ED Triage Notes (Signed)
Pt coming from home with abd pain. Hx of pancreatis. Had been cutting back on his drinking but had been drinking more this week. Pt has had a few episodes of vomiting in the last 24 hours but denies any nausea now. Denies any fevers. States pain is 7/10

## 2016-06-09 NOTE — ED Provider Notes (Signed)
[facility] Emergency Department Provider Note   ____________________________________________   First MD Initiated Contact with Patient 06/09/16 0631     (approximate)  I have reviewed the triage vital signs and the nursing notes.   HISTORY  Chief Complaint Abdominal Pain    HPI Daniel Holden is a 70 y.o. male who presents to the ED from home via EMS with a chief complaint of abdominal pain.Patient is a daily drinker and has had a history of pancreatitis. Admits to drinking more alcohol this week; on average approximately 6 glasses of wine daily. Onset of epigastric, dull aching type abdominal pain 2 days ago associated with occasional nausea and vomiting. Denies associated fever, chills, chest pain, shortness of breath, diarrhea. Denies recent travel or trauma. Nothing makes his symptoms better or worse.   Past Medical History:  Diagnosis Date  . Allergy   . Hypertension   . Pancreatitis 2015    There are no active problems to display for this patient.   Past Surgical History:  Procedure Laterality Date  . SHOULDER SURGERY      Prior to Admission medications   Medication Sig Start Date End Date Taking? Authorizing Provider  azithromycin (ZITHROMAX) 250 MG tablet  12/10/13   Historical Provider, MD  enalapril (VASOTEC) 20 MG tablet  10/27/13   Historical Provider, MD  fluticasone Asencion Islam) 50 MCG/ACT nasal spray  10/27/13   Historical Provider, MD  metoprolol (LOPRESSOR) 50 MG tablet  10/27/13   Historical Provider, MD  ondansetron (ZOFRAN ODT) 4 MG disintegrating tablet Take 1 tablet (4 mg total) by mouth every 8 (eight) hours as needed for nausea or vomiting. 06/09/16   Paulette Blanch, MD  oxyCODONE-acetaminophen (ROXICET) 5-325 MG tablet Take 1 tablet by mouth every 4 (four) hours as needed for severe pain. 06/09/16   Paulette Blanch, MD  ZOSTAVAX 16109 UNT/0.65ML injection  11/03/13   Historical Provider, MD    Allergies Patient has no known  allergies.  History reviewed. No pertinent family history.  Social History Social History  Substance Use Topics  . Smoking status: Never Smoker  . Smokeless tobacco: Never Used  . Alcohol use Yes     Comment: 1 bottle of wine a day or 6-8 beers a day    Review of Systems  Constitutional: No fever/chills. Eyes: No visual changes. ENT: No sore throat. Cardiovascular: Denies chest pain. Respiratory: Denies shortness of breath. Gastrointestinal: Positive for abdominal pain.  Positive for occasional nausea and vomiting.  No diarrhea.  No constipation. Genitourinary: Negative for dysuria. Musculoskeletal: Negative for back pain. Skin: Negative for rash. Neurological: Negative for headaches, focal weakness or numbness.  10-point ROS otherwise negative.  ____________________________________________   PHYSICAL EXAM:  VITAL SIGNS: ED Triage Vitals  Enc Vitals Group     BP 06/09/16 0546 (!) 180/87     Pulse Rate 06/09/16 0546 82     Resp 06/09/16 0546 19     Temp 06/09/16 0546 98.6 F (37 C)     Temp Source 06/09/16 0546 Oral     SpO2 06/09/16 0546 96 %     Weight 06/09/16 0547 185 lb (83.9 kg)     Height 06/09/16 0547 5\' 6"  (1.676 m)     Head Circumference --      Peak Flow --      Pain Score 06/09/16 0547 7     Pain Loc --      Pain Edu? --      Excl.  in [place]? --     Constitutional: Alert and oriented. Well appearing and in mild acute distress. Eyes: Conjunctivae are normal. PERRL. EOMI. Head: Atraumatic. Nose: No congestion/rhinnorhea. Mouth/Throat: Mucous membranes are moist.  Oropharynx non-erythematous. Neck: No stridor.   Cardiovascular: Normal rate, regular rhythm. Grossly normal heart sounds.  Good peripheral circulation. Respiratory: Normal respiratory effort.  No retractions. Lungs CTAB. Gastrointestinal: Soft and mildly tender to palpation at epigastrium without rebound or guarding. No distention. No abdominal bruits. No CVA tenderness. Musculoskeletal: No  lower extremity tenderness nor edema.  No joint effusions. Neurologic:  Normal speech and language. No gross focal neurologic deficits are appreciated. No gait instability. Skin:  Skin is warm, dry and intact. No rash noted. Psychiatric: Mood and affect are normal. Speech and behavior are normal.  ____________________________________________   LABS (all labs ordered are listed, but only abnormal results are displayed)  Labs Reviewed  CBC WITH DIFFERENTIAL/PLATELET - Abnormal; Notable for the following:       Result Value   RBC 3.94 (*)    HCT 38.4 (*)    MCH 35.4 (*)    MCHC 36.3 (*)    Neutro Abs 9.2 (*)    Lymphs Abs 0.2 (*)    All other components within normal limits  COMPREHENSIVE METABOLIC PANEL - Abnormal; Notable for the following:    Sodium 132 (*)    Potassium 3.1 (*)    Chloride 97 (*)    Glucose, Bld 166 (*)    ALT 12 (*)    Total Bilirubin 1.5 (*)    All other components within normal limits  LIPASE, BLOOD - Abnormal; Notable for the following:    Lipase 80 (*)    All other components within normal limits  ETHANOL  TROPONIN I   ____________________________________________  EKG  ED ECG REPORT I, Meeyah Ovitt J, the attending physician, personally viewed and interpreted this ECG.   Date: 06/09/2016  EKG Time: 0543  Rate: 82  Rhythm: normal EKG, normal sinus rhythm  Axis: Normal  Intervals:none  ST&T Change: Nonspecific  ____________________________________________  RADIOLOGY  None ____________________________________________   PROCEDURES  Procedure(s) performed: None  Procedures  Critical Care performed: No  ____________________________________________   INITIAL IMPRESSION / ASSESSMENT AND PLAN / ED COURSE  Pertinent labs & imaging results that were available during my care of the patient were reviewed by me and considered in my medical decision making (see chart for details).  70 year old male, daily drinker of alcohol, who presents  with epigastric abdominal pain. Laboratory results indicative of mild pancreatitis, mild hypokalemia. Elevated bilirubin likely secondary to vomiting. He is resting in no acute distress. Plan for IV fluid resuscitation, morphine, Zofran, Pepcid and reassessment. Discussed with patient if he is able to tolerate PO without emesis tonight his pain he would be able to be discharged home with prescriptions for Percocet and Zofran. At this time care transferred to Dr. Alfred Levins pending reassessment after administration of IV medications and PO challenge.  Clinical Course      ____________________________________________   FINAL CLINICAL IMPRESSION(S) / ED DIAGNOSES  Final diagnoses:  Epigastric pain  Alcohol-induced acute pancreatitis, unspecified complication status  Hypokalemia      NEW MEDICATIONS STARTED DURING THIS VISIT:  New Prescriptions   ONDANSETRON (ZOFRAN ODT) 4 MG DISINTEGRATING TABLET    Take 1 tablet (4 mg total) by mouth every 8 (eight) hours as needed for nausea or vomiting.   OXYCODONE-ACETAMINOPHEN (ROXICET) 5-325 MG TABLET    Take 1 tablet by mouth every  4 (four) hours as needed for severe pain.     Note:  This document was prepared using Dragon voice recognition software and may include unintentional dictation errors.    Paulette Blanch, MD 06/09/16 [phone]

## 2016-06-11 DIAGNOSIS — R52 Pain, unspecified: Secondary | ICD-10-CM | POA: Diagnosis not present

## 2016-06-11 DIAGNOSIS — I10 Essential (primary) hypertension: Secondary | ICD-10-CM | POA: Diagnosis not present

## 2016-06-11 DIAGNOSIS — K5732 Diverticulitis of large intestine without perforation or abscess without bleeding: Secondary | ICD-10-CM | POA: Diagnosis not present

## 2016-06-11 DIAGNOSIS — G4733 Obstructive sleep apnea (adult) (pediatric): Secondary | ICD-10-CM | POA: Diagnosis not present

## 2016-06-28 DIAGNOSIS — K219 Gastro-esophageal reflux disease without esophagitis: Secondary | ICD-10-CM | POA: Diagnosis not present

## 2016-06-28 DIAGNOSIS — R490 Dysphonia: Secondary | ICD-10-CM | POA: Diagnosis not present

## 2016-07-16 DIAGNOSIS — I10 Essential (primary) hypertension: Secondary | ICD-10-CM | POA: Diagnosis not present

## 2016-07-16 DIAGNOSIS — G4733 Obstructive sleep apnea (adult) (pediatric): Secondary | ICD-10-CM | POA: Diagnosis not present

## 2016-07-16 DIAGNOSIS — K5732 Diverticulitis of large intestine without perforation or abscess without bleeding: Secondary | ICD-10-CM | POA: Diagnosis not present

## 2016-07-16 DIAGNOSIS — G4731 Primary central sleep apnea: Secondary | ICD-10-CM | POA: Diagnosis not present

## 2016-10-01 DIAGNOSIS — G4733 Obstructive sleep apnea (adult) (pediatric): Secondary | ICD-10-CM | POA: Diagnosis not present

## 2016-10-01 DIAGNOSIS — H903 Sensorineural hearing loss, bilateral: Secondary | ICD-10-CM | POA: Diagnosis not present

## 2017-03-17 DIAGNOSIS — Z23 Encounter for immunization: Secondary | ICD-10-CM | POA: Diagnosis not present

## 2017-04-22 ENCOUNTER — Encounter: Payer: Self-pay | Admitting: Emergency Medicine

## 2017-04-22 ENCOUNTER — Emergency Department: Payer: Medicare Other

## 2017-04-22 ENCOUNTER — Emergency Department
Admission: EM | Admit: 2017-04-22 | Discharge: 2017-04-22 | Disposition: A | Discharge status: Home / Self Care | Payer: Medicare Other | Attending: Emergency Medicine | Admitting: Emergency Medicine

## 2017-04-22 DIAGNOSIS — R52 Pain, unspecified: Secondary | ICD-10-CM

## 2017-04-22 DIAGNOSIS — F1010 Alcohol abuse, uncomplicated: Secondary | ICD-10-CM | POA: Insufficient documentation

## 2017-04-22 DIAGNOSIS — Z79899 Other long term (current) drug therapy: Secondary | ICD-10-CM | POA: Insufficient documentation

## 2017-04-22 DIAGNOSIS — I10 Essential (primary) hypertension: Secondary | ICD-10-CM | POA: Insufficient documentation

## 2017-04-22 DIAGNOSIS — R1010 Upper abdominal pain, unspecified: Secondary | ICD-10-CM | POA: Diagnosis not present

## 2017-04-22 DIAGNOSIS — K7689 Other specified diseases of liver: Secondary | ICD-10-CM | POA: Diagnosis not present

## 2017-04-22 LAB — URINALYSIS, COMPLETE (UACMP) WITH MICROSCOPIC
Bacteria, UA: NONE SEEN
Bilirubin Urine: NEGATIVE
Glucose, UA: NEGATIVE mg/dL
Ketones, ur: 5 mg/dL — AB
Leukocytes, UA: NEGATIVE
Nitrite: NEGATIVE
Protein, ur: 100 mg/dL — AB
Specific Gravity, Urine: 1.018 (ref 1.005–1.030)
Squamous Epithelial / LPF: NONE SEEN
pH: 5 (ref 5.0–8.0)

## 2017-04-22 LAB — COMPREHENSIVE METABOLIC PANEL
ALT: 20 U/L (ref 17–63)
AST: 36 U/L (ref 15–41)
Albumin: 4.7 g/dL (ref 3.5–5.0)
Alkaline Phosphatase: 50 U/L (ref 38–126)
Anion gap: 11 (ref 5–15)
BUN: 10 mg/dL (ref 6–20)
CO2: 28 mmol/L (ref 22–32)
Calcium: 9.4 mg/dL (ref 8.9–10.3)
Chloride: 90 mmol/L — ABNORMAL LOW (ref 101–111)
Creatinine, Ser: 0.97 mg/dL (ref 0.61–1.24)
GFR calc Af Amer: >60 mL/min (ref >60)
GFR calc non Af Amer: >60 mL/min (ref >60)
Glucose, Bld: 175 mg/dL — ABNORMAL HIGH (ref 65–99)
Potassium: 3 mmol/L — ABNORMAL LOW (ref 3.5–5.1)
Sodium: 129 mmol/L — ABNORMAL LOW (ref 135–145)
Total Bilirubin: 1.4 mg/dL — ABNORMAL HIGH (ref 0.3–1.2)
Total Protein: 8.6 g/dL — ABNORMAL HIGH (ref 6.5–8.1)

## 2017-04-22 LAB — CBC
HCT: 41.5 % (ref 40.0–52.0)
Hemoglobin: 14.6 g/dL (ref 13.0–18.0)
MCH: 35 pg — ABNORMAL HIGH (ref 26.0–34.0)
MCHC: 35.2 g/dL (ref 32.0–36.0)
MCV: 99.5 fL (ref 80.0–100.0)
Platelets: 212 10*3/uL (ref 150–440)
RBC: 4.17 MIL/uL — ABNORMAL LOW (ref 4.40–5.90)
RDW: 12 % (ref 11.5–14.5)
WBC: 7.3 10*3/uL (ref 3.8–10.6)

## 2017-04-22 LAB — LIPASE, BLOOD: Lipase: 110 U/L — ABNORMAL HIGH (ref 11–51)

## 2017-04-22 LAB — TROPONIN I: Troponin I: <0.03 ng/mL (ref <0.03)

## 2017-04-22 MED ORDER — ONDANSETRON 4 MG PO TBDP: 4.0000 mg | ORAL_TABLET | Freq: Three times a day (TID) | ORAL | 0 refills | Status: DC | PRN

## 2017-04-22 MED ORDER — DICYCLOMINE HCL 10 MG PO CAPS
20.0000 mg | ORAL_CAPSULE | Freq: Once | ORAL | Status: AC
Start: 2017-04-22 — End: 2017-04-22
  Administered 2017-04-22: 20 mg via ORAL
  Filled 2017-04-22: qty 2

## 2017-04-22 MED ORDER — ONDANSETRON 4 MG PO TBDP
4.0000 mg | ORAL_TABLET | Freq: Once | ORAL | Status: AC
  Administered 2017-04-22: 4 mg via ORAL

## 2017-04-22 MED ORDER — GI COCKTAIL ~~LOC~~
30.0000 mL | Freq: Once | ORAL | Status: AC
  Administered 2017-04-22: 30 mL via ORAL
  Filled 2017-04-22: qty 30

## 2017-04-22 MED ORDER — FAMOTIDINE 20 MG PO TABS
40.0000 mg | ORAL_TABLET | Freq: Once | ORAL | Status: AC
  Administered 2017-04-22: 40 mg via ORAL
  Filled 2017-04-22: qty 2

## 2017-04-22 MED ORDER — ONDANSETRON 4 MG PO TBDP
ORAL_TABLET | ORAL | Status: AC
  Filled 2017-04-22: qty 1

## 2017-04-22 MED ORDER — OXYCODONE-ACETAMINOPHEN 5-325 MG PO TABS: 1.0000 | ORAL_TABLET | Freq: Four times a day (QID) | ORAL | 0 refills | Status: DC | PRN

## 2017-04-22 MED ORDER — OXYCODONE-ACETAMINOPHEN 5-325 MG PO TABS
1.0000 | ORAL_TABLET | Freq: Once | ORAL | Status: AC
  Administered 2017-04-22: 1 via ORAL
  Filled 2017-04-22: qty 1

## 2017-04-22 NOTE — Discharge Instructions (Signed)
Please take your pain medication only as needed for severe symptoms and follow-up with your primary care physician as needed.  Do not drink alcohol as this likely will make her pain much worse.  Return to the emergency department for any concerns whatsoever such as worsening pain, if you cannot eat or drink, or for any other issues.  It was a pleasure to take care of you today, and thank you for coming to our emergency department.  If you have any questions or concerns before leaving please ask the nurse to grab me and I'm more than happy to go through your aftercare instructions again.  If you were prescribed any opioid pain medication today such as Norco, Vicodin, Percocet, morphine, hydrocodone, or oxycodone please make sure you do not drive when you are taking this medication as it can alter your ability to drive safely.  If you have any concerns once you are home that you are not improving or are in fact getting worse before you can make it to your follow-up appointment, please do not hesitate to call [phone] and come back for further evaluation.  Darel Hong, MD  Results for orders placed or performed during the hospital encounter of 04/22/17  Lipase, blood  Result Value Ref Range   Lipase 110 (H) 11 - 51 U/L  Comprehensive metabolic panel  Result Value Ref Range   Sodium 129 (L) 135 - 145 mmol/L   Potassium 3.0 (L) 3.5 - 5.1 mmol/L   Chloride 90 (L) 101 - 111 mmol/L   CO2 28 22 - 32 mmol/L   Glucose, Bld 175 (H) 65 - 99 mg/dL   BUN 10 6 - 20 mg/dL   Creatinine, Ser 0.97 0.61 - 1.24 mg/dL   Calcium 9.4 8.9 - 10.3 mg/dL   Total Protein 8.6 (H) 6.5 - 8.1 g/dL   Albumin 4.7 3.5 - 5.0 g/dL   AST 36 15 - 41 U/L   ALT 20 17 - 63 U/L   Alkaline Phosphatase 50 38 - 126 U/L   Total Bilirubin 1.4 (H) 0.3 - 1.2 mg/dL   GFR calc non Af Amer >60 >60 mL/min   GFR calc Af Amer >60 >60 mL/min   Anion gap 11 5 - 15  CBC  Result Value Ref Range   WBC 7.3 3.8 - 10.6 K/uL   RBC 4.17 (L) 4.40 -  5.90 MIL/uL   Hemoglobin 14.6 13.0 - 18.0 g/dL   HCT 41.5 40.0 - 52.0 %   MCV 99.5 80.0 - 100.0 fL   MCH 35.0 (H) 26.0 - 34.0 pg   MCHC 35.2 32.0 - 36.0 g/dL   RDW 12.0 11.5 - 14.5 %   Platelets 212 150 - 440 K/uL  Urinalysis, Complete w Microscopic  Result Value Ref Range   Color, Urine YELLOW (A) YELLOW   APPearance HAZY (A) CLEAR   Specific Gravity, Urine 1.018 1.005 - 1.030   pH 5.0 5.0 - 8.0   Glucose, UA NEGATIVE NEGATIVE mg/dL   Hgb urine dipstick SMALL (A) NEGATIVE   Bilirubin Urine NEGATIVE NEGATIVE   Ketones, ur 5 (A) NEGATIVE mg/dL   Protein, ur 100 (A) NEGATIVE mg/dL   Nitrite NEGATIVE NEGATIVE   Leukocytes, UA NEGATIVE NEGATIVE   RBC / HPF 6-30 0 - 5 RBC/hpf   WBC, UA 0-5 0 - 5 WBC/hpf   Bacteria, UA NONE SEEN NONE SEEN   Squamous Epithelial / LPF NONE SEEN NONE SEEN   Mucus PRESENT    Hyaline Casts,  UA PRESENT   Troponin I  Result Value Ref Range   Troponin I <0.03 <0.03 ng/mL   US Abdomen Limited Ruq  Result Date: 04/22/2017 CLINICAL DATA:  Two day history of epigastric region pain EXAM: ULTRASOUND ABDOMEN LIMITED RIGHT UPPER QUADRANT COMPARISON:  None. FINDINGS: Gallbladder: No gallstones or wall thickening visualized. There is no pericholecystic fluid. No sonographic Murphy sign noted by sonographer. Common bile duct: Diameter: 4 mm. No intrahepatic or extrahepatic biliary duct dilatation. Liver: No focal lesion identified. Liver echogenicity overall is increased. Portal vein is patent on color Doppler imaging with normal direction of blood flow towards the liver. IMPRESSION: Increased liver echogenicity, a finding most likely indicative of hepatic steatosis. While no focal liver lesions are evident, the must be cautioned that the sensitivity of ultrasound for detection of focal liver lesions is diminished in this circumstance. Study otherwise unremarkable. Electronically Signed   By: Lowella Grip III M.D.   On: 04/22/2017 17:07

## 2017-04-22 NOTE — ED Provider Notes (Signed)
[facility] Emergency Department Provider Note  ____________________________________________   First MD Initiated Contact with Patient 04/22/17 1623     (approximate)  I have reviewed the triage vital signs and the nursing notes.   HISTORY  Chief Complaint Abdominal Pain    HPI ODA LANSDOWNE is a 71 y.o. male who comes to the emergency department with 2 days of moderate to severe burning epigastric pain radiating to his back that began shortly after going to an oyster roast.  He drank one beer which seemed to worsen his symptoms.  He has a past medical history of alcohol abuse as well as multiple episodes of alcoholic pancreatitis.  His pain is associated with nausea.  The pain is not positional.  It is in his upper abdomen and lower chest.  It is nonexertional.  No shortness of breath.  He has vomited several times and is found it difficult to keep food down.  No diarrhea.  He has no history of abdominal surgeries.  Past Medical History:  Diagnosis Date  . Allergy   . Hypertension   . Pancreatitis 2015    There are no active problems to display for this patient.   Past Surgical History:  Procedure Laterality Date  . SHOULDER SURGERY      Prior to Admission medications   Medication Sig Start Date End Date Taking? Authorizing Provider  amLODipine (NORVASC) 5 MG tablet Take 5 mg by mouth daily. 02/03/17  Yes [provider]  benazepril (LOTENSIN) 20 MG tablet Take 20 mg by mouth daily. 03/08/17  Yes [provider]  fluticasone (FLONASE) 50 MCG/ACT nasal spray Place 1 spray into both nostrils daily.  10/27/13  Yes [provider]  hydrochlorothiazide (HYDRODIURIL) 12.5 MG tablet Take 12.5 mg by mouth daily. 03/08/17  Yes [provider]  metoprolol (LOPRESSOR) 50 MG tablet Take 50 mg by mouth 2 (two) times daily.  10/27/13  Yes [provider]  ondansetron (ZOFRAN ODT) 4 MG disintegrating tablet Take 1 tablet  (4 mg total) by mouth every 8 (eight) hours as needed for nausea or vomiting. Patient not taking: Reported on 04/22/2017 06/09/16   Paulette Blanch, MD  ondansetron (ZOFRAN ODT) 4 MG disintegrating tablet Take 1 tablet (4 mg total) by mouth every 8 (eight) hours as needed for nausea or vomiting. 04/22/17   Darel Hong, MD  oxyCODONE (ROXICODONE) 5 MG immediate release tablet Take 1 tablet (5 mg total) by mouth every 4 (four) hours as needed for moderate pain or severe pain. Patient not taking: Reported on 04/22/2017 06/09/16 06/09/17  Rudene Re, MD  oxyCODONE-acetaminophen (ROXICET) 5-325 MG tablet Take 1 tablet by mouth every 6 (six) hours as needed for severe pain. 04/22/17   Darel Hong, MD    Allergies Dilaudid [hydromorphone hcl]  No family history on file.  Social History Social History  Substance Use Topics  . Smoking status: Never Smoker  . Smokeless tobacco: Never Used  . Alcohol use Yes     Comment: 1 bottle of wine a day or 6-8 beers a day    Review of Systems Constitutional: No fever/chills Eyes: No visual changes. ENT: No sore throat. Cardiovascular: Denies chest pain. Respiratory: Denies shortness of breath. Gastrointestinal: Positive for abdominal pain.  Positive for nausea, positive for vomiting.  No diarrhea.  No constipation. Genitourinary: Negative for dysuria. Musculoskeletal: Negative for back pain. Skin: Negative for rash. Neurological: Negative for headaches, focal weakness or numbness.   ____________________________________________   PHYSICAL  EXAM:  VITAL SIGNS: ED Triage Vitals  Enc Vitals Group     BP 04/22/17 1426 (!) 166/90     Pulse Rate 04/22/17 1426 81     Resp 04/22/17 1426 18     Temp 04/22/17 1426 98 F (36.7 C)     Temp Source 04/22/17 1426 Oral     SpO2 04/22/17 1426 98 %     Weight 04/22/17 1427 191 lb (86.6 kg)     Height 04/22/17 1427 5\' 6"  (1.676 m)     Head Circumference --      Peak Flow --      Pain Score  04/22/17 1426 8     Pain Loc --      Pain Edu? --      Excl. in [place]? --     Constitutional: Alert and oriented x4 appears somewhat uncomfortable sitting up in bed rubbing his upper abdomen and lower chest back and forth Eyes: PERRL EOMI. Head: Atraumatic. Nose: No congestion/rhinnorhea. Mouth/Throat: No trismus Neck: No stridor.   Cardiovascular: Normal rate, regular rhythm. Grossly normal heart sounds.  Good peripheral circulation. Respiratory: Normal respiratory effort.  No retractions. Lungs CTAB and moving good air Gastrointestinal: Distended abdomen soft non-tender no rebound no guarding no frank peritonitis negative Murphy's no McBurney's tenderness negative Rovsing's Musculoskeletal: No lower extremity edema   Neurologic:  Normal speech and language. No gross focal neurologic deficits are appreciated. Skin:  Skin is warm, dry and intact. No rash noted. Psychiatric: Mood and affect are normal. Speech and behavior are normal.    ____________________________________________   DIFFERENTIAL includes but not limited to  Pancreatitis, biliary colic, cholecystitis, cholangitis, acute coronary syndrome, gastritis ____________________________________________   LABS (all labs ordered are listed, but only abnormal results are displayed)  Labs Reviewed  LIPASE, BLOOD - Abnormal; Notable for the following:       Result Value   Lipase 110 (*)    All other components within normal limits  COMPREHENSIVE METABOLIC PANEL - Abnormal; Notable for the following:    Sodium 129 (*)    Potassium 3.0 (*)    Chloride 90 (*)    Glucose, Bld 175 (*)    Total Protein 8.6 (*)    Total Bilirubin 1.4 (*)    All other components within normal limits  CBC - Abnormal; Notable for the following:    RBC 4.17 (*)    MCH 35.0 (*)    All other components within normal limits  URINALYSIS, COMPLETE (UACMP) WITH MICROSCOPIC - Abnormal; Notable for the following:    Color, Urine YELLOW (*)    APPearance  HAZY (*)    Hgb urine dipstick SMALL (*)    Ketones, ur 5 (*)    Protein, ur 100 (*)    All other components within normal limits  TROPONIN I    Blood work reviewed and interpreted by me shows elevated lipase although not 3 times normal.  Slight hypochloremic hyponatremia is consistent with dehydration and elevated bilirubin appears chronic No signs of acute ischemia __________________________________________  EKG  ED ECG REPORT I, Darel Hong, the attending physician, personally viewed and interpreted this ECG.  Date: 04/22/2017 EKG Time:  Rate: 88 Rhythm: normal sinus rhythm QRS Axis: normal Intervals: normal ST/T Wave abnormalities: normal Narrative Interpretation: no evidence of acute ischemia  ____________________________________________  RADIOLOGY  Right upper quadrant ultrasound reviewed by me shows no golf ____________________________________________   PROCEDURES  Procedure(s) performed: no  Procedures  Critical Care performed: no  Observation:  no ____________________________________________   INITIAL IMPRESSION / ASSESSMENT AND PLAN / ED COURSE  Pertinent labs & imaging results that were available during my care of the patient were reviewed by me and considered in my medical decision making (see chart for details).  Patient arrives hemodynamically stable although somewhat uncomfortable appearing with upper abdominal discomfort.  He does have an elevated lipase although not 3 times normal.  He is nauseated.  Differential is broad but in a 71 year old man with upper abdominal lower chest pain I have to consider cardiac etiology.  EKG troponin and right upper quadrant ultrasounds are pending as well as a GI cocktail.  The patient's pain is come down only minimally and I do believe at this point that he likely does have a low-grade pancreatitis.  We will treat him with a short course of Percocet but as the patient is able to eat and drink he does not  require inpatient admission.  Return precautions have been given and the patient verbalized understanding and agreement the plan.  He understands he cannot drink alcohol anymore this will likely recur.      ____________________________________________   FINAL CLINICAL IMPRESSION(S) / ED DIAGNOSES  Final diagnoses:  Pain  Pain of upper abdomen  Alcohol abuse      NEW MEDICATIONS STARTED DURING THIS VISIT:  New Prescriptions   ONDANSETRON (ZOFRAN ODT) 4 MG DISINTEGRATING TABLET    Take 1 tablet (4 mg total) by mouth every 8 (eight) hours as needed for nausea or vomiting.   OXYCODONE-ACETAMINOPHEN (ROXICET) 5-325 MG TABLET    Take 1 tablet by mouth every 6 (six) hours as needed for severe pain.     Note:  This document was prepared using Dragon voice recognition software and may include unintentional dictation errors.     Darel Hong, MD 04/22/17 1746

## 2017-04-22 NOTE — ED Triage Notes (Signed)
Patient complaining of upper abdominal pain that started Saturday night. Reports nausea and vomiting with eating and drinking. States history of pancreatitis with similar symptoms. Denies fever or diarrhea.

## 2017-05-03 DIAGNOSIS — E119 Type 2 diabetes mellitus without complications: Secondary | ICD-10-CM | POA: Diagnosis not present

## 2017-05-03 DIAGNOSIS — I10 Essential (primary) hypertension: Secondary | ICD-10-CM | POA: Diagnosis not present

## 2018-01-01 DIAGNOSIS — H2513 Age-related nuclear cataract, bilateral: Secondary | ICD-10-CM | POA: Diagnosis not present

## 2018-03-19 DIAGNOSIS — K5732 Diverticulitis of large intestine without perforation or abscess without bleeding: Secondary | ICD-10-CM | POA: Diagnosis not present

## 2018-03-19 DIAGNOSIS — G4733 Obstructive sleep apnea (adult) (pediatric): Secondary | ICD-10-CM | POA: Diagnosis not present

## 2018-03-19 DIAGNOSIS — E876 Hypokalemia: Secondary | ICD-10-CM | POA: Diagnosis not present

## 2018-03-19 DIAGNOSIS — Z23 Encounter for immunization: Secondary | ICD-10-CM | POA: Diagnosis not present

## 2018-03-19 DIAGNOSIS — I10 Essential (primary) hypertension: Secondary | ICD-10-CM | POA: Diagnosis not present

## 2018-05-30 DIAGNOSIS — I10 Essential (primary) hypertension: Secondary | ICD-10-CM | POA: Diagnosis not present

## 2018-05-30 DIAGNOSIS — G4733 Obstructive sleep apnea (adult) (pediatric): Secondary | ICD-10-CM | POA: Diagnosis not present

## 2018-05-30 DIAGNOSIS — Z0000 Encounter for general adult medical examination without abnormal findings: Secondary | ICD-10-CM | POA: Diagnosis not present

## 2018-05-30 DIAGNOSIS — K5732 Diverticulitis of large intestine without perforation or abscess without bleeding: Secondary | ICD-10-CM | POA: Diagnosis not present

## 2018-05-30 DIAGNOSIS — E876 Hypokalemia: Secondary | ICD-10-CM | POA: Diagnosis not present

## 2018-06-05 DIAGNOSIS — E7849 Other hyperlipidemia: Secondary | ICD-10-CM | POA: Diagnosis not present

## 2018-06-05 DIAGNOSIS — I10 Essential (primary) hypertension: Secondary | ICD-10-CM | POA: Diagnosis not present

## 2018-10-13 DIAGNOSIS — G4733 Obstructive sleep apnea (adult) (pediatric): Secondary | ICD-10-CM | POA: Diagnosis not present

## 2018-10-13 DIAGNOSIS — J301 Allergic rhinitis due to pollen: Secondary | ICD-10-CM | POA: Diagnosis not present

## 2018-12-08 DIAGNOSIS — G4733 Obstructive sleep apnea (adult) (pediatric): Secondary | ICD-10-CM | POA: Diagnosis not present

## 2018-12-08 DIAGNOSIS — E876 Hypokalemia: Secondary | ICD-10-CM | POA: Diagnosis not present

## 2018-12-08 DIAGNOSIS — I10 Essential (primary) hypertension: Secondary | ICD-10-CM | POA: Diagnosis not present

## 2018-12-08 DIAGNOSIS — K5732 Diverticulitis of large intestine without perforation or abscess without bleeding: Secondary | ICD-10-CM | POA: Diagnosis not present

## 2019-01-23 ENCOUNTER — Other Ambulatory Visit: Payer: Self-pay

## 2019-01-26 DIAGNOSIS — H25813 Combined forms of age-related cataract, bilateral: Secondary | ICD-10-CM | POA: Diagnosis not present

## 2019-03-27 DIAGNOSIS — Z23 Encounter for immunization: Secondary | ICD-10-CM | POA: Diagnosis not present

## 2019-05-15 DIAGNOSIS — M25562 Pain in left knee: Secondary | ICD-10-CM | POA: Diagnosis not present

## 2019-05-27 DIAGNOSIS — M1712 Unilateral primary osteoarthritis, left knee: Secondary | ICD-10-CM | POA: Diagnosis not present

## 2019-06-12 ENCOUNTER — Other Ambulatory Visit: Payer: Self-pay | Admitting: Orthopedic Surgery

## 2019-06-15 ENCOUNTER — Other Ambulatory Visit: Payer: Self-pay

## 2019-06-15 ENCOUNTER — Encounter
Admission: RE | Admit: 2019-06-15 | Discharge: 2019-06-15 | Disposition: A | Discharge status: Home / Self Care | Payer: Medicare Other | Source: Ambulatory Visit | Attending: Orthopedic Surgery | Admitting: Orthopedic Surgery

## 2019-06-15 HISTORY — DX: Sleep apnea, unspecified: G47.30

## 2019-06-15 HISTORY — DX: Anxiety disorder, unspecified: F41.9

## 2019-06-15 NOTE — Patient Instructions (Signed)
Your procedure is scheduled on: 06/25/2019 Thurs Report to Same Day Surgery 2nd floor medical mall [facility] Entrance-take elevator on left to 2nd floor.  Check in with surgery information desk.) To find out your arrival time please call [phone] between 1PM - 3PM on 06/24/2019 Wed  Remember: Instructions that are not followed completely may result in serious medical risk, up to and including death, or upon the discretion of your surgeon and anesthesiologist your surgery may need to be rescheduled.    _x___ 1. Do not eat food after midnight the night before your procedure. You may drink clear liquids up to 2 hours before you are scheduled to arrive at the hospital for your procedure.  Do not drink clear liquids within 2 hours of your scheduled arrival to the hospital.  Clear liquids include  --Water or Apple juice without pulp  --Clear carbohydrate beverage such as ClearFast or Gatorade  --Black Coffee or Clear Tea (No milk, no creamers, do not add anything to                  the coffee or Tea Type 1 and type 2 diabetics should only drink water.   ____Ensure clear carbohydrate drink on the way to the hospital for bariatric patients  ____Ensure clear carbohydrate drink 3 hours before surgery.   No gum chewing or hard candies.     __x__ 2. No Alcohol for 24 hours before or after surgery.   __x__3. No Smoking or e-cigarettes for 24 prior to surgery.  Do not use any chewable tobacco products for at least 6 hour prior to surgery   ____  4. Bring all medications with you on the day of surgery if instructed.    __x__ 5. Notify your doctor if there is any change in your medical condition     (cold, fever, infections).    x___6. On the morning of surgery brush your teeth with toothpaste and water.  You may rinse your mouth with mouth wash if you wish.  Do not swallow any toothpaste or mouthwash.   Do not wear jewelry, make-up, hairpins, clips or nail polish.  Do not wear lotions,  powders, or perfumes. You may wear deodorant.  Do not shave 48 hours prior to surgery. Men may shave face and neck.  Do not bring valuables to the hospital.    [facility] is not responsible for any belongings or valuables.               Contacts, dentures or bridgework may not be worn into surgery.  Leave your suitcase in the car. After surgery it may be brought to your room.  For patients admitted to the hospital, discharge time is determined by your                       treatment team.  _  Patients discharged the day of surgery will not be allowed to drive home.  You will need someone to drive you home and stay with you the night of your procedure.    Please read over the following fact sheets that you were given:   [facility] Preparing for Surgery and or MRSA Information   _x___ Take anti-hypertensive listed below, cardiac, seizure, asthma,     anti-reflux and psychiatric medicines. These include:  1. metoprolol (LOPRESSOR) 50 MG tablet  2.  3.  4.  5.  6.  ____Fleets enema or Magnesium Citrate as directed.   _x___  Use CHG Soap or sage wipes as directed on instruction sheet   ____ Use inhalers on the day of surgery and bring to hospital day of surgery  ____ Stop Metformin and Janumet 2 days prior to surgery.    ____ Take 1/2 of usual insulin dose the night before surgery and none on the morning     surgery.   _x___ Follow recommendations from Cardiologist, Pulmonologist or PCP regarding          stopping Aspirin, Coumadin, Plavix ,Eliquis, Effient, or Pradaxa, and Pletal.  X____Stop Anti-inflammatories such as Advil, Aleve, Ibuprofen, Motrin, Naproxen, Naprosyn, Goodies powders or aspirin products. OK to take Tylenol and                          Celebrex.   _x___ Stop supplements until after surgery.  But may continue Vitamin D, Vitamin B,       and multivitamin.   _x___ Bring C-Pap to the hospital.

## 2019-06-22 ENCOUNTER — Encounter
Admission: RE | Admit: 2019-06-22 | Discharge: 2019-06-22 | Disposition: A | Discharge status: Home / Self Care | Payer: Medicare Other | Source: Ambulatory Visit | Attending: Orthopedic Surgery | Admitting: Orthopedic Surgery

## 2019-06-22 ENCOUNTER — Other Ambulatory Visit: Payer: Self-pay

## 2019-06-22 ENCOUNTER — Other Ambulatory Visit: Payer: Medicare Other

## 2019-06-22 DIAGNOSIS — S83242A Other tear of medial meniscus, current injury, left knee, initial encounter: Secondary | ICD-10-CM | POA: Insufficient documentation

## 2019-06-22 DIAGNOSIS — I10 Essential (primary) hypertension: Secondary | ICD-10-CM | POA: Diagnosis not present

## 2019-06-22 DIAGNOSIS — Z01818 Encounter for other preprocedural examination: Secondary | ICD-10-CM | POA: Insufficient documentation

## 2019-06-22 DIAGNOSIS — Z20828 Contact with and (suspected) exposure to other viral communicable diseases: Secondary | ICD-10-CM | POA: Diagnosis not present

## 2019-06-22 LAB — CBC WITH DIFFERENTIAL/PLATELET
Abs Immature Granulocytes: 0.03 10*3/uL (ref 0.00–0.07)
Basophils Absolute: 0.1 10*3/uL (ref 0.0–0.1)
Basophils Relative: 1 %
Eosinophils Absolute: 0.1 10*3/uL (ref 0.0–0.5)
Eosinophils Relative: 1 %
HCT: 35.5 % — ABNORMAL LOW (ref 39.0–52.0)
Hemoglobin: 13 g/dL (ref 13.0–17.0)
Immature Granulocytes: 1 %
Lymphocytes Relative: 23 %
Lymphs Abs: 1.5 10*3/uL (ref 0.7–4.0)
MCH: 34.7 pg — ABNORMAL HIGH (ref 26.0–34.0)
MCHC: 36.6 g/dL — ABNORMAL HIGH (ref 30.0–36.0)
MCV: 94.7 fL (ref 80.0–100.0)
Monocytes Absolute: 0.7 10*3/uL (ref 0.1–1.0)
Monocytes Relative: 10 %
Neutro Abs: 4.1 10*3/uL (ref 1.7–7.7)
Neutrophils Relative %: 64 %
Platelets: 208 10*3/uL (ref 150–400)
RBC: 3.75 MIL/uL — ABNORMAL LOW (ref 4.22–5.81)
RDW: 11.5 % (ref 11.5–15.5)
WBC: 6.4 10*3/uL (ref 4.0–10.5)
nRBC: 0 % (ref 0.0–0.2)

## 2019-06-22 LAB — BASIC METABOLIC PANEL
Anion gap: 11 (ref 5–15)
BUN: 15 mg/dL (ref 8–23)
CO2: 22 mmol/L (ref 22–32)
Calcium: 9.1 mg/dL (ref 8.9–10.3)
Chloride: 99 mmol/L (ref 98–111)
Creatinine, Ser: 0.56 mg/dL — ABNORMAL LOW (ref 0.61–1.24)
GFR calc Af Amer: >60 mL/min (ref >60)
GFR calc non Af Amer: >60 mL/min (ref >60)
Glucose, Bld: 108 mg/dL — ABNORMAL HIGH (ref 70–99)
Potassium: 4 mmol/L (ref 3.5–5.1)
Sodium: 132 mmol/L — ABNORMAL LOW (ref 135–145)

## 2019-06-22 LAB — PROTIME-INR
INR: 1 (ref 0.8–1.2)
Prothrombin Time: 13.1 seconds (ref 11.4–15.2)

## 2019-06-22 LAB — APTT: aPTT: 28 seconds (ref 24–36)

## 2019-06-23 LAB — SARS CORONAVIRUS 2 (TAT 6-24 HRS): SARS Coronavirus 2: NEGATIVE

## 2019-06-24 MED ORDER — CEFAZOLIN SODIUM-DEXTROSE 2-4 GM/100ML-% IV SOLN
2.0000 g | INTRAVENOUS | Status: AC
  Administered 2019-06-25: 08:00:00 2 g via INTRAVENOUS

## 2019-06-25 ENCOUNTER — Encounter
Admission: RE | Disposition: A | Discharge status: Home / Self Care | Payer: Self-pay | Source: Home / Self Care | Attending: Orthopedic Surgery

## 2019-06-25 ENCOUNTER — Other Ambulatory Visit: Payer: Self-pay

## 2019-06-25 ENCOUNTER — Ambulatory Visit: Payer: Medicare Other | Admitting: Certified Registered Nurse Anesthetist

## 2019-06-25 ENCOUNTER — Encounter: Payer: Self-pay | Admitting: Orthopedic Surgery

## 2019-06-25 ENCOUNTER — Ambulatory Visit
Admission: RE | Admit: 2019-06-25 | Discharge: 2019-06-25 | Disposition: A | Discharge status: Home / Self Care | Payer: Medicare Other | Attending: Orthopedic Surgery | Admitting: Orthopedic Surgery

## 2019-06-25 DIAGNOSIS — Z7951 Long term (current) use of inhaled steroids: Secondary | ICD-10-CM | POA: Insufficient documentation

## 2019-06-25 DIAGNOSIS — Z79899 Other long term (current) drug therapy: Secondary | ICD-10-CM | POA: Diagnosis not present

## 2019-06-25 DIAGNOSIS — F419 Anxiety disorder, unspecified: Secondary | ICD-10-CM | POA: Insufficient documentation

## 2019-06-25 DIAGNOSIS — G4730 Sleep apnea, unspecified: Secondary | ICD-10-CM | POA: Diagnosis not present

## 2019-06-25 DIAGNOSIS — X58XXXA Exposure to other specified factors, initial encounter: Secondary | ICD-10-CM | POA: Insufficient documentation

## 2019-06-25 DIAGNOSIS — I10 Essential (primary) hypertension: Secondary | ICD-10-CM | POA: Diagnosis not present

## 2019-06-25 DIAGNOSIS — M1712 Unilateral primary osteoarthritis, left knee: Secondary | ICD-10-CM | POA: Diagnosis not present

## 2019-06-25 DIAGNOSIS — S83242A Other tear of medial meniscus, current injury, left knee, initial encounter: Secondary | ICD-10-CM | POA: Diagnosis present

## 2019-06-25 HISTORY — PX: KNEE ARTHROSCOPY WITH MEDIAL MENISECTOMY: SHX5651

## 2019-06-25 SURGERY — ARTHROSCOPY, KNEE, WITH MEDIAL MENISCECTOMY
Anesthesia: General | Site: Knee | Laterality: Left

## 2019-06-25 MED ORDER — ACETAMINOPHEN 10 MG/ML IV SOLN
INTRAVENOUS | Status: DC | PRN
  Administered 2019-06-25: 1000 mg via INTRAVENOUS

## 2019-06-25 MED ORDER — BUPIVACAINE HCL (PF) 0.25 % IJ SOLN
INTRAMUSCULAR | Status: AC
  Filled 2019-06-25: qty 30

## 2019-06-25 MED ORDER — FAMOTIDINE 20 MG PO TABS
20.0000 mg | ORAL_TABLET | Freq: Once | ORAL | Status: AC
  Administered 2019-06-25: 07:00:00 20 mg via ORAL

## 2019-06-25 MED ORDER — LIDOCAINE HCL (CARDIAC) PF 100 MG/5ML IV SOSY
PREFILLED_SYRINGE | INTRAVENOUS | Status: DC | PRN
  Administered 2019-06-25: 100 mg via INTRAVENOUS

## 2019-06-25 MED ORDER — FENTANYL CITRATE (PF) 100 MCG/2ML IJ SOLN
INTRAMUSCULAR | Status: AC
  Filled 2019-06-25: qty 2

## 2019-06-25 MED ORDER — SEVOFLURANE IN SOLN
RESPIRATORY_TRACT | Status: AC
  Filled 2019-06-25: qty 250

## 2019-06-25 MED ORDER — PHENYLEPHRINE HCL (PRESSORS) 10 MG/ML IV SOLN
INTRAVENOUS | Status: AC
  Filled 2019-06-25: qty 1

## 2019-06-25 MED ORDER — GLYCOPYRROLATE 0.2 MG/ML IJ SOLN
INTRAMUSCULAR | Status: AC
  Filled 2019-06-25: qty 1

## 2019-06-25 MED ORDER — FENTANYL CITRATE (PF) 100 MCG/2ML IJ SOLN: 25.0000 ug | INTRAMUSCULAR | Status: DC | PRN

## 2019-06-25 MED ORDER — DEXAMETHASONE SODIUM PHOSPHATE 10 MG/ML IJ SOLN
INTRAMUSCULAR | Status: AC
  Filled 2019-06-25: qty 1

## 2019-06-25 MED ORDER — HYDROCODONE-ACETAMINOPHEN 5-325 MG PO TABS: 1.0000 | ORAL_TABLET | ORAL | 0 refills | Status: DC | PRN

## 2019-06-25 MED ORDER — OXYCODONE HCL 5 MG PO TABS: 5.0000 mg | ORAL_TABLET | Freq: Once | ORAL | Status: DC | PRN

## 2019-06-25 MED ORDER — CEFAZOLIN SODIUM-DEXTROSE 2-4 GM/100ML-% IV SOLN
INTRAVENOUS | Status: AC
  Filled 2019-06-25: qty 100

## 2019-06-25 MED ORDER — PROPOFOL 10 MG/ML IV BOLUS
INTRAVENOUS | Status: AC
  Filled 2019-06-25: qty 20

## 2019-06-25 MED ORDER — CHLORHEXIDINE GLUCONATE CLOTH 2 % EX PADS: 6.0000 | MEDICATED_PAD | Freq: Once | CUTANEOUS | Status: DC

## 2019-06-25 MED ORDER — ASPIRIN EC 325 MG PO TBEC: 325.0000 mg | DELAYED_RELEASE_TABLET | Freq: Every day | ORAL | 0 refills | Status: DC

## 2019-06-25 MED ORDER — FAMOTIDINE 20 MG PO TABS
ORAL_TABLET | ORAL | Status: AC
  Filled 2019-06-25: qty 1

## 2019-06-25 MED ORDER — DEXAMETHASONE SODIUM PHOSPHATE 10 MG/ML IJ SOLN
INTRAMUSCULAR | Status: DC | PRN
  Administered 2019-06-25: 10 mg via INTRAVENOUS

## 2019-06-25 MED ORDER — EPHEDRINE SULFATE 50 MG/ML IJ SOLN
INTRAMUSCULAR | Status: DC | PRN
  Administered 2019-06-25 (×2): 5 mg via INTRAVENOUS

## 2019-06-25 MED ORDER — LIDOCAINE HCL (PF) 1 % IJ SOLN
INTRAMUSCULAR | Status: DC | PRN
  Administered 2019-06-25: 5 mL

## 2019-06-25 MED ORDER — LIDOCAINE HCL (PF) 2 % IJ SOLN
INTRAMUSCULAR | Status: AC
  Filled 2019-06-25: qty 10

## 2019-06-25 MED ORDER — FENTANYL CITRATE (PF) 100 MCG/2ML IJ SOLN
INTRAMUSCULAR | Status: DC | PRN
  Administered 2019-06-25: 25 ug via INTRAVENOUS
  Administered 2019-06-25 (×2): 50 ug via INTRAVENOUS

## 2019-06-25 MED ORDER — LACTATED RINGERS IV SOLN: INTRAVENOUS | Status: DC

## 2019-06-25 MED ORDER — OXYCODONE HCL 5 MG/5ML PO SOLN: 5.0000 mg | Freq: Once | ORAL | Status: DC | PRN

## 2019-06-25 MED ORDER — ONDANSETRON HCL 4 MG/2ML IJ SOLN
INTRAMUSCULAR | Status: DC | PRN
  Administered 2019-06-25: 4 mg via INTRAVENOUS

## 2019-06-25 MED ORDER — ONDANSETRON HCL 4 MG/2ML IJ SOLN
INTRAMUSCULAR | Status: AC
  Filled 2019-06-25: qty 2

## 2019-06-25 MED ORDER — EPHEDRINE SULFATE 50 MG/ML IJ SOLN
INTRAMUSCULAR | Status: AC
  Filled 2019-06-25: qty 1

## 2019-06-25 MED ORDER — ACETAMINOPHEN 10 MG/ML IV SOLN
INTRAVENOUS | Status: AC
  Filled 2019-06-25: qty 100

## 2019-06-25 MED ORDER — LIDOCAINE HCL (PF) 1 % IJ SOLN
INTRAMUSCULAR | Status: AC
  Filled 2019-06-25: qty 30

## 2019-06-25 MED ORDER — EPINEPHRINE PF 1 MG/ML IJ SOLN
INTRAMUSCULAR | Status: AC
  Filled 2019-06-25: qty 1

## 2019-06-25 MED ORDER — GLYCOPYRROLATE 0.2 MG/ML IJ SOLN
INTRAMUSCULAR | Status: DC | PRN
  Administered 2019-06-25: .2 mg via INTRAVENOUS

## 2019-06-25 MED ORDER — PROPOFOL 10 MG/ML IV BOLUS
INTRAVENOUS | Status: DC | PRN
  Administered 2019-06-25: 180 mg via INTRAVENOUS
  Administered 2019-06-25: 20 mg via INTRAVENOUS

## 2019-06-25 MED ORDER — ONDANSETRON HCL 4 MG PO TABS: 4.0000 mg | ORAL_TABLET | Freq: Three times a day (TID) | ORAL | 0 refills | Status: DC | PRN

## 2019-06-25 MED ORDER — BUPIVACAINE-EPINEPHRINE (PF) 0.25% -1:200000 IJ SOLN
INTRAMUSCULAR | Status: DC | PRN
  Administered 2019-06-25: 20 mL

## 2019-06-25 SURGICAL SUPPLY — 37 items
ADAPTER IRRIG TUBE 2 SPIKE SOL (ADAPTER) ×4 IMPLANT
BUR RADIUS 3.5 (BURR) ×2 IMPLANT
BUR RADIUS 4.0X18.5 (BURR) ×2 IMPLANT
COVER WAND RF STERILE (DRAPES) ×2 IMPLANT
CUFF TOURN SGL QUICK 30 (TOURNIQUET CUFF) ×1
CUFF TRNQT CYL 30X4X21-28X (TOURNIQUET CUFF) ×1 IMPLANT
DRAPE IMP U-DRAPE 54X76 (DRAPES) ×2 IMPLANT
DURAPREP 26ML APPLICATOR (WOUND CARE) ×4 IMPLANT
GAUZE SPONGE 4X4 12PLY STRL (GAUZE/BANDAGES/DRESSINGS) ×2 IMPLANT
GAUZE XEROFORM 1X8 LF (GAUZE/BANDAGES/DRESSINGS) ×2 IMPLANT
GLOVE BIOGEL PI IND STRL 9 (GLOVE) ×1 IMPLANT
GLOVE BIOGEL PI INDICATOR 9 (GLOVE) ×1
GLOVE SURG 9.0 ORTHO LTXF (GLOVE) ×4 IMPLANT
GOWN STRL REUS W/ TWL LRG LVL3 (GOWN DISPOSABLE) ×1 IMPLANT
GOWN STRL REUS W/TWL 2XL LVL3 (GOWN DISPOSABLE) ×2 IMPLANT
GOWN STRL REUS W/TWL LRG LVL3 (GOWN DISPOSABLE) ×1
IV LACTATED RINGER IRRG 3000ML (IV SOLUTION) ×6
IV LR IRRIG 3000ML ARTHROMATIC (IV SOLUTION) ×6 IMPLANT
KIT TURNOVER KIT A (KITS) ×2 IMPLANT
MANIFOLD NEPTUNE II (INSTRUMENTS) ×2 IMPLANT
MAT ABSORB  FLUID 56X50 GRAY (MISCELLANEOUS) ×1
MAT ABSORB FLUID 56X50 GRAY (MISCELLANEOUS) ×1 IMPLANT
NEEDLE HYPO 22GX1.5 SAFETY (NEEDLE) ×2 IMPLANT
PACK ARTHROSCOPY KNEE (MISCELLANEOUS) ×2 IMPLANT
PAD ABD DERMACEA PRESS 5X9 (GAUZE/BANDAGES/DRESSINGS) ×4 IMPLANT
PAD CAST CTTN 4X4 STRL (SOFTGOODS) ×1 IMPLANT
PADDING CAST COTTON 4X4 STRL (SOFTGOODS) ×1
SET TUBE SUCT SHAVER OUTFL 24K (TUBING) ×2 IMPLANT
SET TUBE TIP INTRA-ARTICULAR (MISCELLANEOUS) ×2 IMPLANT
SOL PREP PVP 2OZ (MISCELLANEOUS) ×2
SOLUTION PREP PVP 2OZ (MISCELLANEOUS) ×1 IMPLANT
STRIP CLOSURE SKIN 1/2X4 (GAUZE/BANDAGES/DRESSINGS) ×2 IMPLANT
SUT ETHILON 4-0 (SUTURE) ×1
SUT ETHILON 4-0 FS2 18XMFL BLK (SUTURE) ×1
SUTURE ETHLN 4-0 FS2 18XMF BLK (SUTURE) ×1 IMPLANT
TUBING ARTHRO INFLOW-ONLY STRL (TUBING) ×2 IMPLANT
WAND HAND CNTRL MULTIVAC 90 (MISCELLANEOUS) ×2 IMPLANT

## 2019-06-25 NOTE — Op Note (Signed)
PATIENT:  Daniel Holden  PRE-OPERATIVE DIAGNOSIS:  TEAR OF MEDIAL MENISCUS, LEFT KNEE  POST-OPERATIVE DIAGNOSIS: LEFT KNEE TEAR OF MEDIAL MENISCUS, MEDIAL PLICA, TRICOMPARTMENTAL OA   PROCEDURE:  LEFT KNEE ARTHROSCOPY WITH  Partial MEDIAL MENISECTOMY, synovectomy, excision of medial plica, chondroplasty of the medial and lateral femoral condyles and trochlea  SURGEON:  Thornton Park, MD  ANESTHESIA:   General  PREOPERATIVE INDICATIONS:  Daniel Holden  73 y.o. male with a diagnosis of TEAR OF MEDIAL MENISCUS, LEFT KNEE who failed conservative management and elected for surgical management.    The risks benefits and alternatives were discussed with the patient preoperatively including the risks of infection, bleeding, nerve injury, knee stiffness, persistent pain, osteoarthritis and the need for further surgery. Medical  risks include DVT and pulmonary embolism, myocardial infarction, stroke, pneumonia, respiratory failure and death. The patient understood these risks and wished to proceed.   OPERATIVE FINDINGS: Flap type tear of the posterior horn of the medial meniscus, tricompartmental osteoarthritis with full-thickness chondral loss of the lateral femoral condyle extending into the trochlea, extensive fraying of the cartilage in the medial compartment and lateral tibial plateau.  Small area of full-thickness cartilage loss of the medial tibial plateau.  OPERATIVE PROCEDURE: Patient was met in the preoperative area. The left knee was signed with the word yes and my initials according the hospital's correct site of surgery protocol.  Preop history and physical was performed at the bedside.    The patient was brought to the operating room where they was placed supine on the operative table. General anesthesia was administered. The patient was prepped and draped in a sterile fashion.  A timeout was performed to verify the patient's name, date of birth, medical record number, correct  site of surgery correct procedure to be performed. It was also used to verify the patient received antibiotics that all appropriate instruments, and radiographic studies were available in the room. Once all in attendance were in agreement, the case began.  Proposed arthroscopy incisions were drawn out with a surgical marker. These were pre-injected with 1% lidocaine plain. An 11 blade was used to establish an inferior lateral and inferomedial portals. The inferomedial portal was created using a 18-gauge spinal needle under direct visualization.  A full diagnostic examination of the knee was performed including the suprapatellar pouch, patellofemoral joint, medial lateral compartments as well as the medial lateral gutters, the intercondylar notch in the posterior knee.  Findings are noted above.  Patient had the tear of the posterior horn of the medial meniscus  treated with a 4.0 resector shaver blade and straight duckbill basket. The meniscus was debrided until a stable rim was achieved. A chondroplasty of the medial femoral condyle, trochlea and lateral femoral condyle was also performed using a 4.0 resector shaver blade. A partial synovectomy was also performed using a 4.0 resector shaver blade and 90 ArthroCare wand.  The medial plica was also debrided using a 90 degree ArthroCare wand.  The knee was then copiously lavaged. All arthroscopic instruments were removed. The 2 arthroscopy portals were closed with 4-0 nylon. Steri-Strips were applied along with a dry sterile and compressive dressing. The patient was brought to the PACU in stable condition. I was scrubbed and present for the entire case and all sharp and instrument counts were correct at the conclusion the case. I spoke with the patient's wife postoperatively to let her know the case was performed without complication and the patient was stable in the recovery room.  Timoteo Gaul, MD

## 2019-06-25 NOTE — Discharge Instructions (Signed)

## 2019-06-25 NOTE — Anesthesia Postprocedure Evaluation (Signed)
Anesthesia Post Note  Patient: Daniel Holden  Procedure(s) Performed: KNEE ARTHROSCOPY WITH MEDIAL MENISECTOMY (Left Knee)  Patient location during evaluation: PACU Anesthesia Type: General Level of consciousness: awake and alert Pain management: pain level controlled Vital Signs Assessment: post-procedure vital signs reviewed and stable Respiratory status: spontaneous breathing, nonlabored ventilation, respiratory function stable and patient connected to nasal cannula oxygen Cardiovascular status: blood pressure returned to baseline and stable Postop Assessment: no apparent nausea or vomiting Anesthetic complications: no     Last Vitals:  Vitals:   06/25/19 0953 06/25/19 1013  BP: (!) 161/71 (!) 152/75  Pulse: 62 61  Resp: 14   Temp: 36.6 C   SpO2: 97%     Last Pain:  Vitals:   06/25/19 0953  TempSrc: Oral  PainSc: 0-No pain                 Precious Haws Evita Merida

## 2019-06-25 NOTE — Transfer of Care (Signed)
Immediate Anesthesia Transfer of Care Note  Patient: Daniel Holden  Procedure(s) Performed: KNEE ARTHROSCOPY WITH MEDIAL MENISECTOMY (Left Knee)  Patient Location: PACU  Anesthesia Type:General  Level of Consciousness: awake, alert  and oriented  Airway & Oxygen Therapy: Patient Spontanous Breathing and Patient connected to face mask oxygen  Post-op Assessment: Report given to RN and Post -op Vital signs reviewed and stable  Post vital signs: Reviewed and stable  Last Vitals:  Vitals Value Taken Time  BP 142/78 06/25/19 0914  Temp 36.2 C 06/25/19 0914  Pulse 66 06/25/19 0914  Resp 18 06/25/19 0914  SpO2 100 % 06/25/19 0914  Vitals shown include unvalidated device data.  Last Pain:  Vitals:   06/25/19 0914  TempSrc: Temporal  PainSc:          Complications: No apparent anesthesia complications

## 2019-06-25 NOTE — H&P (Signed)
PREOPERATIVE H&P  Chief Complaint: Left knee medial meniscus tear  HPI: Daniel Holden is a 73 y.o. male who presents for preoperative history and physical with a diagnosis of left knee medial meniscus tear. Symptoms of pain, and mechanical symptoms are significantly impairing activities of daily living.  He has agreed with surgical management after failing non-operative treatment.  Past Medical History:  Diagnosis Date  . Allergy   . Anxiety   . Hypertension   . Pancreatitis 2015  . Sleep apnea    Past Surgical History:  Procedure Laterality Date  . APPENDECTOMY    . SHOULDER SURGERY    . TONSILLECTOMY     Social History   Socioeconomic History  . Marital status: Married    Spouse name: Not on file  . Number of children: Not on file  . Years of education: Not on file  . Highest education level: Not on file  Occupational History  . Not on file  Tobacco Use  . Smoking status: Never Smoker  . Smokeless tobacco: Never Used  Substance and Sexual Activity  . Alcohol use: Yes    Alcohol/week: 2.0 standard drinks    Types: 2 Cans of beer per week    Comment: 6 per day  . Drug use: No  . Sexual activity: Not on file  Other Topics Concern  . Not on file  Social History Narrative  . Not on file   Social Determinants of Health   Financial Resource Strain:   . Difficulty of Paying Living Expenses: Not on file  Food Insecurity:   . Worried About Charity fundraiser in the Last Year: Not on file  . Ran Out of Food in the Last Year: Not on file  Transportation Needs:   . Lack of Transportation (Medical): Not on file  . Lack of Transportation (Non-Medical): Not on file  Physical Activity:   . Days of Exercise per Week: Not on file  . Minutes of Exercise per Session: Not on file  Stress:   . Feeling of Stress : Not on file  Social Connections:   . Frequency of Communication with Friends and Family: Not on file  . Frequency of Social Gatherings with Friends and Family:  Not on file  . Attends Religious Services: Not on file  . Active Member of Clubs or Organizations: Not on file  . Attends Archivist Meetings: Not on file  . Marital Status: Not on file   History reviewed. No pertinent family history. Allergies  Allergen Reactions  . Dilaudid [Hydromorphone Hcl] Nausea And Vomiting   Prior to Admission medications   Medication Sig Start Date End Date Taking? Authorizing Provider  ALPRAZolam Duanne Moron) 0.5 MG tablet Take 0.25 mg by mouth at bedtime as needed for sleep.    Yes [provider]  b complex vitamins tablet Take 1 tablet by mouth daily.   Yes [provider]  benazepril (LOTENSIN) 20 MG tablet Take 20 mg by mouth daily. 03/08/17  Yes [provider]  diphenhydrAMINE (BENADRYL) 25 MG tablet Take 25 mg by mouth at bedtime as needed for sleep.    Yes [provider]  fluticasone (FLONASE) 50 MCG/ACT nasal spray Place 2 sprays into both nostrils daily.  10/27/13  Yes [provider]  GLUCOSAMINE-CHONDROITIN PO Take 2 tablets by mouth daily.   Yes [provider]  hydrochlorothiazide (HYDRODIURIL) 12.5 MG tablet Take 12.5 mg by mouth daily. 03/08/17  Yes [provider]  metoprolol (LOPRESSOR)  50 MG tablet Take 50 mg by mouth 2 (two) times daily.  10/27/13  Yes [provider]  naproxen sodium (ALEVE) 220 MG tablet Take 220-440 mg by mouth 2 (two) times daily as needed (pain).    Yes [provider]  sildenafil (VIAGRA) 50 MG tablet Take 100 mg by mouth daily as needed for erectile dysfunction.  01/12/19  Yes [provider]     Positive ROS: All other systems have been reviewed and were otherwise negative with the exception of those mentioned in the HPI and as above.  Physical Exam: General: Alert, no acute distress Cardiovascular: Regular rate and rhythm, no murmurs rubs or gallops.  No pedal edema Respiratory: Clear to auscultation bilaterally, no wheezes  rales or rhonchi. No cyanosis, no use of accessory musculature GI: No organomegaly, abdomen is soft and non-tender nondistended with positive bowel sounds. Skin: Skin intact, no lesions within the operative field. Neurologic: Sensation intact distally Psychiatric: Patient is competent for consent with normal mood and affect Lymphatic: No cervical lymphadenopathy  MUSCULOSKELETAL: Left knee: Skin is intact.  There is no erythema ecchymosis or significant effusion.  He has palpable pedal pulses, intact sensation light touch and intact motor function.  Patient has no ligamentous laxity.  The patient has tenderness over the medial joint line and pain with McMurray's testing.  Assessment: Left knee medial meniscus tear  Plan: Plan for Procedure(s): LEFT KNEE ARTHROSCOPY WITH MEDIAL MENISECTOMY  I have reviewed the details of the operation as well as the postoperative course with the patient.  I discussed the risks and benefits of surgery. The risks include but are not limited to infection, bleeding, nerve or blood vessel injury, joint stiffness or loss of motion, persistent pain, weakness or instability, retear of the meniscus and the need for further surgery. Medical risks include but are not limited to DVT and pulmonary embolism, myocardial infarction, stroke, pneumonia, respiratory failure and death. Patient understood these risks and wished to proceed.     Thornton Park, MD   06/25/2019 7:39 AM

## 2019-06-25 NOTE — Anesthesia Procedure Notes (Signed)
Procedure Name: LMA Insertion Date/Time: 06/25/2019 7:52 AM Performed by: Johnna Acosta, CRNA Pre-anesthesia Checklist: Patient identified, Emergency Drugs available, Suction available, Patient being monitored and Timeout performed Patient Re-evaluated:Patient Re-evaluated prior to induction Oxygen Delivery Method: Circle system utilized Preoxygenation: Pre-oxygenation with 100% oxygen Induction Type: IV induction Ventilation: Mask ventilation without difficulty LMA: LMA inserted LMA Size: 5.0 Number of attempts: 1 Placement Confirmation: positive ETCO2 and breath sounds checked- equal and bilateral Tube secured with: Tape Dental Injury: Teeth and Oropharynx as per pre-operative assessment

## 2019-06-25 NOTE — Anesthesia Post-op Follow-up Note (Signed)
Anesthesia QCDR form completed.        

## 2019-06-25 NOTE — Anesthesia Preprocedure Evaluation (Signed)
Anesthesia Evaluation  Patient identified by MRN, date of birth, ID band Patient awake    Reviewed: Allergy & Precautions, H&P , NPO status , Patient's Chart, lab work & pertinent test results  History of Anesthesia Complications Negative for: history of anesthetic complications  Airway Mallampati: III  TM Distance: <3 FB Neck ROM: limited    Dental  (+) Chipped   Pulmonary neg shortness of breath, sleep apnea and Continuous Positive Airway Pressure Ventilation ,           Cardiovascular Exercise Tolerance: Good hypertension, (-) angina(-) Past MI and (-) DOE      Neuro/Psych PSYCHIATRIC DISORDERS negative neurological ROS     GI/Hepatic negative GI ROS, Neg liver ROS, neg GERD  ,  Endo/Other  negative endocrine ROS  Renal/GU      Musculoskeletal   Abdominal   Peds  Hematology negative hematology ROS (+)   Anesthesia Other Findings Past Medical History: No date: Allergy No date: Anxiety No date: Hypertension 2015: Pancreatitis No date: Sleep apnea  Past Surgical History: No date: APPENDECTOMY No date: SHOULDER SURGERY No date: TONSILLECTOMY  BMI    Body Mass Index: 33.09 kg/m      Reproductive/Obstetrics negative OB ROS                             Anesthesia Physical Anesthesia Plan  ASA: III  Anesthesia Plan: General LMA   Post-op Pain Management:    Induction: Intravenous  PONV Risk Score and Plan: Dexamethasone, Ondansetron, Midazolam and Treatment may vary due to age or medical condition  Airway Management Planned: LMA  Additional Equipment:   Intra-op Plan:   Post-operative Plan: Extubation in OR  Informed Consent: I have reviewed the patients History and Physical, chart, labs and discussed the procedure including the risks, benefits and alternatives for the proposed anesthesia with the patient or authorized representative who has indicated his/her  understanding and acceptance.     Dental Advisory Given  Plan Discussed with: Anesthesiologist, CRNA and Surgeon  Anesthesia Plan Comments: (Patient consented for risks of anesthesia including but not limited to:  - adverse reactions to medications - damage to teeth, lips or other oral mucosa - sore throat or hoarseness - Damage to heart, brain, lungs or loss of life  Patient voiced understanding.)        Anesthesia Quick Evaluation

## 2019-07-01 DIAGNOSIS — Z0000 Encounter for general adult medical examination without abnormal findings: Secondary | ICD-10-CM | POA: Diagnosis not present

## 2019-07-01 DIAGNOSIS — I10 Essential (primary) hypertension: Secondary | ICD-10-CM | POA: Diagnosis not present

## 2019-07-01 DIAGNOSIS — G4731 Primary central sleep apnea: Secondary | ICD-10-CM | POA: Diagnosis not present

## 2019-07-01 DIAGNOSIS — G4733 Obstructive sleep apnea (adult) (pediatric): Secondary | ICD-10-CM | POA: Diagnosis not present

## 2019-07-06 DIAGNOSIS — M25662 Stiffness of left knee, not elsewhere classified: Secondary | ICD-10-CM | POA: Diagnosis not present

## 2019-07-06 DIAGNOSIS — M25562 Pain in left knee: Secondary | ICD-10-CM | POA: Diagnosis not present

## 2019-09-08 DIAGNOSIS — G4731 Primary central sleep apnea: Secondary | ICD-10-CM | POA: Diagnosis not present

## 2019-09-08 DIAGNOSIS — R079 Chest pain, unspecified: Secondary | ICD-10-CM | POA: Diagnosis not present

## 2019-09-08 DIAGNOSIS — I10 Essential (primary) hypertension: Secondary | ICD-10-CM | POA: Diagnosis not present

## 2019-09-14 DIAGNOSIS — R079 Chest pain, unspecified: Secondary | ICD-10-CM | POA: Diagnosis not present

## 2019-09-14 DIAGNOSIS — I10 Essential (primary) hypertension: Secondary | ICD-10-CM | POA: Diagnosis not present

## 2019-09-14 DIAGNOSIS — G4733 Obstructive sleep apnea (adult) (pediatric): Secondary | ICD-10-CM | POA: Diagnosis not present

## 2019-09-14 DIAGNOSIS — G4731 Primary central sleep apnea: Secondary | ICD-10-CM | POA: Diagnosis not present

## 2019-10-19 DIAGNOSIS — D2339 Other benign neoplasm of skin of other parts of face: Secondary | ICD-10-CM | POA: Diagnosis not present

## 2019-10-19 DIAGNOSIS — D0439 Carcinoma in situ of skin of other parts of face: Secondary | ICD-10-CM | POA: Diagnosis not present

## 2019-10-19 DIAGNOSIS — L720 Epidermal cyst: Secondary | ICD-10-CM | POA: Diagnosis not present

## 2019-10-19 DIAGNOSIS — D485 Neoplasm of uncertain behavior of skin: Secondary | ICD-10-CM | POA: Diagnosis not present

## 2019-11-25 DIAGNOSIS — D0439 Carcinoma in situ of skin of other parts of face: Secondary | ICD-10-CM | POA: Diagnosis not present

## 2019-12-13 ENCOUNTER — Encounter: Payer: Self-pay | Admitting: Internal Medicine

## 2019-12-14 ENCOUNTER — Ambulatory Visit (INDEPENDENT_AMBULATORY_CARE_PROVIDER_SITE_OTHER): Payer: Medicare Other | Admitting: Internal Medicine

## 2019-12-14 ENCOUNTER — Other Ambulatory Visit: Payer: Self-pay

## 2019-12-14 ENCOUNTER — Encounter: Payer: Self-pay | Admitting: Internal Medicine

## 2019-12-14 VITALS — BP 164/87 | HR 63 | Ht 68.0 in | Wt 212.8 lb

## 2019-12-14 DIAGNOSIS — G4733 Obstructive sleep apnea (adult) (pediatric): Secondary | ICD-10-CM | POA: Diagnosis not present

## 2019-12-14 DIAGNOSIS — E6609 Other obesity due to excess calories: Secondary | ICD-10-CM

## 2019-12-14 DIAGNOSIS — M542 Cervicalgia: Secondary | ICD-10-CM | POA: Diagnosis not present

## 2019-12-14 DIAGNOSIS — F419 Anxiety disorder, unspecified: Secondary | ICD-10-CM | POA: Diagnosis not present

## 2019-12-14 DIAGNOSIS — Z9989 Dependence on other enabling machines and devices: Secondary | ICD-10-CM

## 2019-12-14 DIAGNOSIS — Z6832 Body mass index (BMI) 32.0-32.9, adult: Secondary | ICD-10-CM

## 2019-12-14 MED ORDER — ALPRAZOLAM 0.5 MG PO TABS: 0.2500 mg | ORAL_TABLET | Freq: Two times a day (BID) | ORAL | 0 refills | Status: DC | PRN

## 2019-12-14 NOTE — Assessment & Plan Note (Signed)
Patient has appointment with neurology.

## 2019-12-14 NOTE — Assessment & Plan Note (Signed)
Sleep apnea is under control

## 2019-12-14 NOTE — Assessment & Plan Note (Signed)
Patient was advised to lose weight walk on a daily basis

## 2019-12-14 NOTE — Progress Notes (Signed)
Established Patient Office Visit  SUBJECTIVE:  Subjective  Patient ID: Daniel Holden, male    DOB: Jul 21, 1945  Age: 74 y.o. MRN: [id number]  CC:  Chief Complaint  Patient presents with  . Anxiety    patient needs refill of xanax     HPI Daniel Holden is a 74 y.o. male presenting today for medication refill for his anxiety medication, Xanax.   He states that he had a squamous cell carcinoma cancer cut off but [facility] on the left side of his face.   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 He notes that he was moping the floor the other day and he has to stop after a couple of minutes because he began experiencing pain for about 15-20 seconds that was significant. He states that he can walk up and down his steps without issue as long as he doesn't move his shoulders at all, but if he moves his shoulders, he has pain. He has an appointment with neurology.     Past Medical History:  Diagnosis Date  . Allergy   . Anxiety   . Hypertension   . Pancreatitis 2015  . Sleep apnea     Past Surgical History:  Procedure Laterality Date  . APPENDECTOMY    . KNEE ARTHROSCOPY WITH MEDIAL MENISECTOMY Left 06/25/2019   Procedure: KNEE ARTHROSCOPY WITH MEDIAL MENISECTOMY;  Surgeon: Thornton Park, MD;  Location: [facility] ORS;  Service: Orthopedics;  Laterality: Left;  . SHOULDER SURGERY    . TONSILLECTOMY      History reviewed. No pertinent family history.  Social History   Socioeconomic History  . Marital status: Married    Spouse name: Not on file  . Number of children: Not on file  . Years of education: Not on file  . Highest education level: Not on file  Occupational History  . Not on file  Tobacco Use  . Smoking status: Never Smoker  . Smokeless tobacco: Never Used  Vaping Use  . Vaping Use: Never used  Substance and Sexual Activity  . Alcohol use: Yes    Alcohol/week: 2.0 standard drinks    Types: 2 Cans of beer per week    Comment: 6  per day  . Drug use: No  . Sexual activity: Not on file  Other Topics Concern  . Not on file  Social History Narrative  . Not on file   Social Determinants of Health   Financial Resource Strain:   . Difficulty of Paying Living Expenses:   Food Insecurity:   . Worried About Charity fundraiser in the Last Year:   . Arboriculturist in the Last Year:   Transportation Needs:   . Film/video editor (Medical):   [name] Lack of Transportation (Non-Medical):   Physical Activity:   . Days of Exercise per Week:   . Minutes of Exercise per Session:   Stress:   . Feeling of Stress :   Social Connections:   . Frequency of Communication with Friends and Family:   . Frequency of Social Gatherings with Friends and Family:   . Attends Religious Services:   . Active Member of Clubs or Organizations:   . Attends Archivist Meetings:   [name] Marital Status:   Intimate Partner Violence:   . Fear of Current or Ex-Partner:   . Emotionally Abused:   [name] Physically Abused:   . Sexually Abused:      Current Outpatient Medications:  .  ALPRAZolam (XANAX) 0.5 MG tablet, Take 0.5 tablets (0.25 mg total) by mouth 2 (two) times daily as needed for sleep., Disp: 60 tablet, Rfl: 0 .  aspirin EC 325 MG tablet, Take 1 tablet (325 mg total) by mouth daily., Disp: 45 tablet, Rfl: 0 .  b complex vitamins tablet, Take 1 tablet by mouth daily., Disp: , Rfl:  .  diphenhydrAMINE (BENADRYL) 25 MG tablet, Take 25 mg by mouth at bedtime as needed for sleep. , Disp: , Rfl:  .  fluticasone (FLONASE) 50 MCG/ACT nasal spray, Place 2 sprays into both nostrils daily. , Disp: , Rfl:  .  metoprolol (LOPRESSOR) 50 MG tablet, Take 50 mg by mouth 2 (two) times daily. , Disp: , Rfl:  .  naproxen sodium (ALEVE) 220 MG tablet, Take 220-440 mg by mouth 2 (two) times daily as needed (pain). , Disp: , Rfl:  .  sildenafil (VIAGRA) 50 MG tablet, Take 100 mg by mouth daily as needed for erectile dysfunction. , Disp: , Rfl:     Allergies  Allergen Reactions  . Dilaudid [Hydromorphone Hcl] Nausea And Vomiting    ROS Review of Systems  Constitutional: Negative.   HENT: Negative.   Eyes: Negative.   Respiratory: Negative.   Cardiovascular: Negative.   Gastrointestinal: Negative.   Endocrine: Negative.   Genitourinary: Negative.   Musculoskeletal: Negative.   Skin: Negative.   Allergic/Immunologic: Negative.   Neurological: Negative.   Hematological: Negative.   Psychiatric/Behavioral: Negative.   All other systems reviewed and are negative.    OBJECTIVE:    Physical Exam Vitals reviewed.  Constitutional:      Appearance: Normal appearance.  Neck:     Vascular: No carotid bruit.  Cardiovascular:     Rate and Rhythm: Normal rate and regular rhythm.     Pulses: Normal pulses.     Heart sounds: Normal heart sounds.  Pulmonary:     Effort: Pulmonary effort is normal.     Breath sounds: Normal breath sounds.  Abdominal:     General: Bowel sounds are normal.     Palpations: Abdomen is soft. There is no hepatomegaly or splenomegaly.     Tenderness: There is no abdominal tenderness.     Hernia: No hernia is present.  Musculoskeletal:     Right lower leg: No edema.     Left lower leg: No edema.  Skin:    Findings: No rash.       Neurological:     Mental Status: He is alert and oriented to person, place, and time.  Psychiatric:        Mood and Affect: Mood normal.        Behavior: Behavior normal.     BP (!) 164/87   Pulse 63   Ht 5\' 8"  (1.727 m)   Wt 212 lb 12.8 oz (96.5 kg)   BMI 32.36 kg/m  Wt Readings from Last 3 Encounters:  12/14/19 212 lb 12.8 oz (96.5 kg)  06/25/19 205 lb 0.4 oz (93 kg)  06/15/19 205 lb (93 kg)    Health Maintenance Due  Topic Date Due  . Hepatitis C Screening  Never done  . COVID-19 Vaccine (1) Never done  . TETANUS/TDAP  Never done  . COLONOSCOPY  Never done  . PNA vac Low Risk Adult (1 of 2 - PCV13) Never done    There are no preventive care  reminders to display for this patient.  CBC Latest Ref Rng & Units 06/22/2019 04/22/2017 06/09/2016  WBC  4.0 - 10.5 K/uL 6.4 7.3 9.9  Hemoglobin 13.0 - 17.0 g/dL 13.0 14.6 13.9  Hematocrit 39 - 52 % 35.5(L) 41.5 38.4(L)  Platelets 150 - 400 K/uL 208 212 174   CMP Latest Ref Rng & Units 06/22/2019 04/22/2017 06/09/2016  Glucose 70 - 99 mg/dL 108(H) 175(H) 166(H)  BUN 8 - 23 mg/dL 15 10 18   Creatinine 0.61 - 1.24 mg/dL 0.56(L) 0.97 0.92  Sodium 135 - 145 mmol/L 132(L) 129(L) 132(L)  Potassium 3.5 - 5.1 mmol/L 4.0 3.0(L) 3.1(L)  Chloride 98 - 111 mmol/L 99 90(L) 97(L)  CO2 22 - 32 mmol/L 22 28 23   Calcium 8.9 - 10.3 mg/dL 9.1 9.4 8.9  Total Protein 6.5 - 8.1 g/dL - 8.6(H) 7.9  Total Bilirubin 0.3 - 1.2 mg/dL - 1.4(H) 1.5(H)  Alkaline Phos 38 - 126 U/L - 50 41  AST 15 - 41 U/L - 36 38  ALT 17 - 63 U/L - 20 12(L)    No results found for: TSH Lab Results  Component Value Date   ALBUMIN 4.7 04/22/2017   ANIONGAP 11 06/22/2019   Lab Results  Component Value Date   CHOL 83 09/27/2013   HDL 28 (L) 09/27/2013   LDLCALC 17 09/27/2013   Lab Results  Component Value Date   TRIG 188 09/27/2013   No results found for: HGBA1C    ASSESSMENT & PLAN:  Assessment & Plan   Problem List Items Addressed This Visit      Respiratory   OSA on CPAP    Sleep apnea is under control        Other   Neck ache    Patient has appointment with neurology.      Anxiety - Primary    Patient was given Xanax 0.5 mg p.o. twice a day.  Was asked to refrain from drinking alcohol      Relevant Medications   ALPRAZolam (XANAX) 0.5 MG tablet   Class 1 obesity due to excess calories with serious comorbidity and body mass index (BMI) of 32.0 to 32.9 in adult    Patient was advised to lose weight walk on a daily basis         Meds ordered this encounter  Medications  . ALPRAZolam (XANAX) 0.5 MG tablet    Sig: Take 0.5 tablets (0.25 mg total) by mouth 2 (two) times daily as needed for sleep.      Dispense:  60 tablet    Refill:  0     1. Anxiety Stable on alprazolam. - ALPRAZolam (XANAX) 0.5 MG tablet; Take 0.5 tablets (0.25 mg total) by mouth 2 (two) times daily as needed for sleep.  Dispense: 60 tablet; Refill: 0  2. Neck ache Patient is a neurologist for that.  He was told that his symptoms of neck pain going to the both arms atypical if the pain becomes severe that he should go to the hospital to get his heart checked.  3. OSA on CPAP Patient is using CPAP machine  4. Class 1 obesity due to excess calories with serious comorbidity and body mass index (BMI) of 32.0 to 32.9 in adult Advised to lose weight. Follow-up: Return in 3 months (on 03/15/2020) for RTC after apt w/ neurology.    Dr. Jane Canary [facility] [address], [place], [place] [address]   By signing my name below, I, [employer], attest that this documentation has been prepared under the direction and in the presence of [place], [place],  MD. Electronically Signed: Cletis Athens, MD 12/14/19, 9:50 AM    I personally performed the services described in this documentation, which was SCRIBED in my presence. The recorded information has been reviewed and considered accurate. It has been edited as necessary during review. Cletis Athens, MD

## 2019-12-14 NOTE — Assessment & Plan Note (Signed)
Patient was given Xanax 0.5 mg p.o. twice a day.  Was asked to refrain from drinking alcohol

## 2020-01-05 DIAGNOSIS — M25569 Pain in unspecified knee: Secondary | ICD-10-CM | POA: Diagnosis not present

## 2020-01-05 DIAGNOSIS — M1712 Unilateral primary osteoarthritis, left knee: Secondary | ICD-10-CM | POA: Diagnosis not present

## 2020-01-11 DIAGNOSIS — M4802 Spinal stenosis, cervical region: Secondary | ICD-10-CM | POA: Diagnosis not present

## 2020-01-11 DIAGNOSIS — M542 Cervicalgia: Secondary | ICD-10-CM | POA: Diagnosis not present

## 2020-01-11 DIAGNOSIS — M5412 Radiculopathy, cervical region: Secondary | ICD-10-CM | POA: Diagnosis not present

## 2020-01-21 DIAGNOSIS — R202 Paresthesia of skin: Secondary | ICD-10-CM | POA: Diagnosis not present

## 2020-01-21 DIAGNOSIS — Z9989 Dependence on other enabling machines and devices: Secondary | ICD-10-CM | POA: Diagnosis not present

## 2020-01-21 DIAGNOSIS — G4733 Obstructive sleep apnea (adult) (pediatric): Secondary | ICD-10-CM | POA: Diagnosis not present

## 2020-01-21 DIAGNOSIS — M79602 Pain in left arm: Secondary | ICD-10-CM | POA: Diagnosis not present

## 2020-01-21 DIAGNOSIS — E559 Vitamin D deficiency, unspecified: Secondary | ICD-10-CM | POA: Diagnosis not present

## 2020-01-21 DIAGNOSIS — Z87898 Personal history of other specified conditions: Secondary | ICD-10-CM | POA: Diagnosis not present

## 2020-01-21 DIAGNOSIS — M79601 Pain in right arm: Secondary | ICD-10-CM | POA: Diagnosis not present

## 2020-01-21 DIAGNOSIS — E531 Pyridoxine deficiency: Secondary | ICD-10-CM | POA: Diagnosis not present

## 2020-01-21 DIAGNOSIS — E538 Deficiency of other specified B group vitamins: Secondary | ICD-10-CM | POA: Diagnosis not present

## 2020-01-21 DIAGNOSIS — R7303 Prediabetes: Secondary | ICD-10-CM | POA: Diagnosis not present

## 2020-01-21 DIAGNOSIS — E519 Thiamine deficiency, unspecified: Secondary | ICD-10-CM | POA: Diagnosis not present

## 2020-01-25 DIAGNOSIS — E538 Deficiency of other specified B group vitamins: Secondary | ICD-10-CM | POA: Diagnosis not present

## 2020-02-01 DIAGNOSIS — E538 Deficiency of other specified B group vitamins: Secondary | ICD-10-CM | POA: Diagnosis not present

## 2020-02-03 DIAGNOSIS — M1712 Unilateral primary osteoarthritis, left knee: Secondary | ICD-10-CM | POA: Diagnosis not present

## 2020-02-03 DIAGNOSIS — H2513 Age-related nuclear cataract, bilateral: Secondary | ICD-10-CM | POA: Diagnosis not present

## 2020-02-03 DIAGNOSIS — M25562 Pain in left knee: Secondary | ICD-10-CM | POA: Diagnosis not present

## 2020-02-08 DIAGNOSIS — E538 Deficiency of other specified B group vitamins: Secondary | ICD-10-CM | POA: Diagnosis not present

## 2020-02-15 DIAGNOSIS — E538 Deficiency of other specified B group vitamins: Secondary | ICD-10-CM | POA: Diagnosis not present

## 2020-02-17 DIAGNOSIS — M1712 Unilateral primary osteoarthritis, left knee: Secondary | ICD-10-CM | POA: Diagnosis not present

## 2020-02-25 DIAGNOSIS — M1712 Unilateral primary osteoarthritis, left knee: Secondary | ICD-10-CM | POA: Diagnosis not present

## 2020-03-01 DIAGNOSIS — R202 Paresthesia of skin: Secondary | ICD-10-CM | POA: Diagnosis not present

## 2020-03-01 DIAGNOSIS — M79601 Pain in right arm: Secondary | ICD-10-CM | POA: Diagnosis not present

## 2020-03-01 DIAGNOSIS — M79602 Pain in left arm: Secondary | ICD-10-CM | POA: Diagnosis not present

## 2020-03-04 DIAGNOSIS — M1712 Unilateral primary osteoarthritis, left knee: Secondary | ICD-10-CM | POA: Diagnosis not present

## 2020-03-09 ENCOUNTER — Ambulatory Visit (INDEPENDENT_AMBULATORY_CARE_PROVIDER_SITE_OTHER): Payer: Medicare Other | Admitting: Internal Medicine

## 2020-03-09 ENCOUNTER — Other Ambulatory Visit: Payer: Self-pay

## 2020-03-09 DIAGNOSIS — Z23 Encounter for immunization: Secondary | ICD-10-CM

## 2020-03-17 DIAGNOSIS — G5603 Carpal tunnel syndrome, bilateral upper limbs: Secondary | ICD-10-CM | POA: Diagnosis not present

## 2020-03-18 DIAGNOSIS — K219 Gastro-esophageal reflux disease without esophagitis: Secondary | ICD-10-CM | POA: Diagnosis not present

## 2020-03-18 DIAGNOSIS — J301 Allergic rhinitis due to pollen: Secondary | ICD-10-CM | POA: Diagnosis not present

## 2020-04-01 ENCOUNTER — Ambulatory Visit: Payer: Medicare Other | Attending: Neurology | Admitting: Occupational Therapy

## 2020-04-01 ENCOUNTER — Other Ambulatory Visit: Payer: Self-pay

## 2020-04-01 DIAGNOSIS — G5603 Carpal tunnel syndrome, bilateral upper limbs: Secondary | ICD-10-CM | POA: Insufficient documentation

## 2020-04-01 NOTE — Therapy (Signed)
[facility] [address], [place], [address] Phone: [phone]   Fax:  [phone]  Occupational Therapy Screen  Pt requested screen - not evaluation   Patient Details  Name: Daniel Holden MRN: [id number] Date of Birth: December 17, 1945 No data recorded  Encounter Date: 04/01/2020   OT End of Session - 04/01/20 1236    Visit Number 0           Past Medical History:  Diagnosis Date  . Allergy   . Anxiety   . Hypertension   . Pancreatitis 2015  . Sleep apnea     Past Surgical History:  Procedure Laterality Date  . APPENDECTOMY    . KNEE ARTHROSCOPY WITH MEDIAL MENISECTOMY Left 06/25/2019   Procedure: KNEE ARTHROSCOPY WITH MEDIAL MENISECTOMY;  Surgeon: Thornton Park, MD;  Location: [facility] ORS;  Service: Orthopedics;  Laterality: Left;  . SHOULDER SURGERY    . TONSILLECTOMY         NOTE FROM DR [facility] procedure on 03/01/2020: Patient History: Patient is a 74 year-old male who presents with bilateral hand pain that shoots up into the arms. Bilateral hand weakness. Trouble holding tools and objects. Symptoms for 3 months. Denies neck pain. Past medical history is significant for alcohol abuse for about 30 years.   Exam: Tone is normal in all extremities. Muscle strength in all extremities is 5/5. No abnormal movements, fasciculations or atrophy seen. Deep tendon reflexes 1+. Toes mute.  Sensations were decreased in bilateral LE, decreased light touch, vibration, temperature sense, and proprioception. Gait and station are wide based when examined in small exam room. Romberg's test is positive.  Skin temperature was measured before Nerve Conduction Study was performed on each limb. Surface skin temperature was increased and maintained to at least 32 degree centigrade throughout the study.  EMG & NCV Findings: Evaluation of the Right median motor nerve showed decreased conduction velocity (Elbow-Wrist, 27  m/s). The Left median sensory and the Right median sensory nerves showed prolonged distal peak latency (L3.9, R4.1 ms) and decreased conduction velocity (Wrist-2nd Digit, L33, R32 m/s). The Right ulnar sensory nerve showed prolonged distal peak latency (3.8 ms) and decreased conduction velocity (Wrist-5th Digit, 32 m/s). The Left median/ulnar (palm) comparison and the Right median/ulnar (palm) comparison nerves showed abnormal peak latency difference (Median Palm-Ulnar Palm, L0.7, R0.7 ms). All remaining nerves (as indicated in the following tables) were within normal limits.   EMG  Side Muscle Nerve Root Ins Act Fibs Psw Amp Dur Poly Recrt Int Fraser Din Comment  Right Abd Poll Brev Median C8-T1 Nml Nml Nml Incr >26ms 1+ mild Reduced Nml  Right 1stDorInt Ulnar C8-T1 Nml Nml Nml Nml Nml 0 Nml Nml  Right ExtDigCom Radial (Post Int) C7-8 Nml Nml Nml Nml Nml 0 Nml Nml  Right PronatorTeres Median C6-7 Nml Nml Nml Nml Nml 0 Nml Nml  Left Abd Poll Brev Median C8-T1 Nml Nml Nml Nml Nml 0 Nml Nml  Left 1stDorInt Ulnar C8-T1 Nml Nml Nml Nml Nml 0 Nml Nml  Left ExtDigCom Radial (Post Int) C7-8 Nml Nml Nml Nml Nml 0 Nml Nml  Left PronatorTeres Median C6-7 Nml Nml Nml Nml Nml 0 Nml Nml   Impression: This is an abnormal electrodiagnostic exam consistent with 1) 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 2) Left moderate (grade III) carpal tunnel syndrome (median nerve entrapment at wrist).         OT SCREEN THIS  DATE:  There were no vitals filed for this visit.   Subjective Assessment - 04/01/20 1235    Subjective  I have my symptoms for few months - and first I thought it was my heart - but then Dr Manuella Ghazi test showed CT - but I want to know if therapy can help and if it is CT         Per pt he has the pain shooting from my wrist and hands up to my chest - about 7-8 times day - and last about 30 sec Randomly - not related to activity  Pt report he uses his hands in  yard work, Therapist, art, weed eater and blower. And then work on [employer] for kids toys - like go carts , fixing things around the house. Help with house work and cooking and some on desk top computer - did like to shoot his guns at range up to few months ago -it started hurting his thumb. R hand dominant  Denies falling or injury to hands  No pain with AROM for cervical in all planes - little stiff but WFL  Pt AROM for wrist flexion , extention WNL -and not tightness in flexors of forearm   Negative this date for Tinel over CT and Phalen's Pt thumb PA and RA - opposition WNL for strength But do show some atrophy at thenar eminence  Grip strength R 74 lbs, L 66 lbs - with extended arm 80 lbs R , L 70 lbs (WNL for his age)   Pt ed on modifications about how to use his hands with his tools , and yard work vibration -  And can try contrast 2 x day and wife to do some CT spreads - 10 reps And provided pt bilateral wrist neoprene wraps to wear night time and during day for 10 days prior to appt with ortho - to remind pt to keep out of deep flexion with grip or pinch Will follow up with OT for screen on the 18th Oct                                 Patient will benefit from skilled therapeutic intervention in order to improve the following deficits and impairments:           Visit Diagnosis: Carpal tunnel syndrome, bilateral upper limbs    Problem List Patient Active Problem List   Diagnosis Date Noted  . OSA on CPAP 12/14/2019  . Neck ache 12/14/2019  . Anxiety 12/14/2019  . Class 1 obesity due to excess calories with serious comorbidity and body mass index (BMI) of 32.0 to 32.9 in adult 12/14/2019    Rosalyn Gess OTR/L,CLT 04/01/2020, 12:37 PM  [facility] [address] [place], [address] Phone: [phone]   Fax:  [phone]  Name: Daniel Holden MRN:  [id number] Date of Birth: [birth year]

## 2020-04-02 DIAGNOSIS — M1712 Unilateral primary osteoarthritis, left knee: Secondary | ICD-10-CM | POA: Diagnosis not present

## 2020-04-11 ENCOUNTER — Ambulatory Visit: Payer: Medicare Other | Admitting: Occupational Therapy

## 2020-04-11 ENCOUNTER — Other Ambulatory Visit: Payer: Self-pay

## 2020-04-11 DIAGNOSIS — G5603 Carpal tunnel syndrome, bilateral upper limbs: Secondary | ICD-10-CM

## 2020-04-11 NOTE — Therapy (Signed)
[facility] [address], [place], [address] Phone: [phone]   Fax:  [phone]  Occupational Therapy Screen   Patient Details  Name: Daniel Holden MRN: [id number] Date of Birth: 02/11/1946 No data recorded  Encounter Date: 04/11/2020   OT End of Session - 04/11/20 0843    Visit Number 0           Past Medical History:  Diagnosis Date  . Allergy   . Anxiety   . Hypertension   . Pancreatitis 2015  . Sleep apnea     Past Surgical History:  Procedure Laterality Date  . APPENDECTOMY    . KNEE ARTHROSCOPY WITH MEDIAL MENISECTOMY Left 06/25/2019   Procedure: KNEE ARTHROSCOPY WITH MEDIAL MENISECTOMY;  Surgeon: Thornton Park, MD;  Location: [facility] ORS;  Service: Orthopedics;  Laterality: Left;  . SHOULDER SURGERY    . TONSILLECTOMY      There were no vitals filed for this visit.   Subjective Assessment - 04/11/20 0842    Subjective  I was on vacation for week - was hard to do the contrast - but I did when I was at home - and used my hands since Thursday a lot - but did modify how I grip and use my hands with my tools              Pt was screened the 04/01/2020:   Per pt he has the pain shooting from my wrist and hands up to my chest - about 7-8 times day - and last about 30 sec Randomly - not related to activity Pt report he uses his hands in yard work, Therapist, art, weed eater and blower. And then work on [employer] for kids toys - like go carts , fixing things around the house. Help with house work and cooking and some on desk top computer - did like to shoot his guns at range up to few months ago -it started hurting his thumb. R hand dominant  Denies falling or injury to hands  No pain with AROM for cervical in all planes - little stiff but WFL  Pt AROM for wrist flexion , extention WNL -and not tightness in flexors of forearm   Negative this date for Tinel over CT and Phalen's Pt  thumb PA and RA - opposition WNL for strength But do show some atrophy at thenar eminence  Grip strength R 74 lbs, L 66 lbs - with extended arm 80 lbs R , L 70 lbs (WNL for his age)   Pt ed on modifications about how to use his hands with his tools , and yard work vibration -  And can try contrast 2 x day and wife to do some CT spreads - 10 reps And provided pt bilateral wrist neoprene wraps to wear night time and during day for 10 days prior to appt with ortho - to remind pt to keep out of deep flexion with grip or pinch Will follow up with OT for screen on the 18th Oct  TODAY 04/11/2020:   Pt report pain decrease to about 3-4 x day , and intensity not as hight-about 3/10 now  Mostly when walking with his hands down  Not using his hands No numbness or pins and needles Phalen's and Tinel still negative  Grip strength and AROM still same as on 04/01/2020 Still atrophy thenar eminence  Thumb AROM and strength WFL  Pt to see hand surgeon tomorrow to  discuss his CTS and results from nerve conduction done at [facility] Pt can cont with Home program he has done the last 10 days                                     Visit Diagnosis: Carpal tunnel syndrome, bilateral upper limbs    Problem List Patient Active Problem List   Diagnosis Date Noted  . OSA on CPAP 12/14/2019  . Neck ache 12/14/2019  . Anxiety 12/14/2019  . Class 1 obesity due to excess calories with serious comorbidity and body mass index (BMI) of 32.0 to 32.9 in adult 12/14/2019    Rosalyn Gess OTR/L,CLT 04/11/2020, 8:43 AM  [facility] [address], [place], [address] Phone: [phone]   Fax:  [phone]  Name: Daniel Holden MRN: [id number] Date of Birth: 06-15-1946

## 2020-04-12 DIAGNOSIS — G5603 Carpal tunnel syndrome, bilateral upper limbs: Secondary | ICD-10-CM | POA: Diagnosis not present

## 2020-04-18 DIAGNOSIS — E538 Deficiency of other specified B group vitamins: Secondary | ICD-10-CM | POA: Diagnosis not present

## 2020-04-20 DIAGNOSIS — M170 Bilateral primary osteoarthritis of knee: Secondary | ICD-10-CM | POA: Diagnosis not present

## 2020-05-02 DIAGNOSIS — Z23 Encounter for immunization: Secondary | ICD-10-CM | POA: Diagnosis not present

## 2020-05-16 DIAGNOSIS — E538 Deficiency of other specified B group vitamins: Secondary | ICD-10-CM | POA: Diagnosis not present

## 2020-05-27 DIAGNOSIS — D2272 Melanocytic nevi of left lower limb, including hip: Secondary | ICD-10-CM | POA: Diagnosis not present

## 2020-05-27 DIAGNOSIS — Z85828 Personal history of other malignant neoplasm of skin: Secondary | ICD-10-CM | POA: Diagnosis not present

## 2020-05-27 DIAGNOSIS — D2262 Melanocytic nevi of left upper limb, including shoulder: Secondary | ICD-10-CM | POA: Diagnosis not present

## 2020-05-27 DIAGNOSIS — D2261 Melanocytic nevi of right upper limb, including shoulder: Secondary | ICD-10-CM | POA: Diagnosis not present

## 2020-05-27 DIAGNOSIS — L570 Actinic keratosis: Secondary | ICD-10-CM | POA: Diagnosis not present

## 2020-05-27 DIAGNOSIS — D225 Melanocytic nevi of trunk: Secondary | ICD-10-CM | POA: Diagnosis not present

## 2020-06-01 DIAGNOSIS — M5412 Radiculopathy, cervical region: Secondary | ICD-10-CM | POA: Diagnosis not present

## 2020-06-13 DIAGNOSIS — E538 Deficiency of other specified B group vitamins: Secondary | ICD-10-CM | POA: Diagnosis not present

## 2020-06-14 ENCOUNTER — Ambulatory Visit: Payer: Medicare Other | Attending: Neurology | Admitting: Physical Therapy

## 2020-06-14 ENCOUNTER — Other Ambulatory Visit: Payer: Self-pay

## 2020-06-14 ENCOUNTER — Encounter: Payer: Self-pay | Admitting: Physical Therapy

## 2020-06-14 DIAGNOSIS — M5412 Radiculopathy, cervical region: Secondary | ICD-10-CM | POA: Diagnosis not present

## 2020-06-14 DIAGNOSIS — M79601 Pain in right arm: Secondary | ICD-10-CM | POA: Insufficient documentation

## 2020-06-14 DIAGNOSIS — G5603 Carpal tunnel syndrome, bilateral upper limbs: Secondary | ICD-10-CM | POA: Diagnosis not present

## 2020-06-14 DIAGNOSIS — M79602 Pain in left arm: Secondary | ICD-10-CM | POA: Insufficient documentation

## 2020-06-14 NOTE — Therapy (Signed)
[facility] [address] [place], [address] Phone: [phone]   Fax:  [phone]  Physical Therapy Evaluation  Patient Details  Name: Daniel Holden MRN: [id number] Date of Birth: 12-Mar-1946 Referring Provider (PT): Hemang K. Manuella Ghazi (Neurology)   Encounter Date: 06/14/2020   PT End of Session - 06/14/20 1657    Visit Number 1    Number of Visits 24    Date for PT Re-Evaluation 09/06/20    Authorization Type Medicare reporting period from 06/14/2020    Progress Note Due on Visit 10    PT Start Time 1355    PT Stop Time 1435    PT Time Calculation (min) 40 min    Activity Tolerance Patient tolerated treatment well    Behavior During Therapy [facility] for tasks assessed/performed           Past Medical History:  Diagnosis Date  . Allergy   . Anxiety   . Hypertension   . Pancreatitis 2015  . Sleep apnea     Past Surgical History:  Procedure Laterality Date  . APPENDECTOMY    . KNEE ARTHROSCOPY WITH MEDIAL MENISECTOMY Left 06/25/2019   Procedure: KNEE ARTHROSCOPY WITH MEDIAL MENISECTOMY;  Surgeon: Thornton Park, MD;  Location: [facility] ORS;  Service: Orthopedics;  Laterality: Left;  . SHOULDER SURGERY    . TONSILLECTOMY      There were no vitals filed for this visit.    Subjective Assessment - 06/14/20 1358    Subjective Patient states his chief complaint started about 6 months ago for no apparent reason. 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 He saw cardiology to rule out vascular problems (which was done), and the Jenita Seashore, PA in neurology thought that it could originate from some narrowing at C5-C7 she could see on his cervical MRI (performed at [facility]) and she sent him to PT to attempt to alleviate his symptoms with the idea they could be  coming from his neck. He was going to have a carpal tunnel release done but the surgeon did not think it would solve his problems. He thinks he does have some carpal tunnel symptoms, but not that really bother him. When he walks to the mailbox and swings his bilateral arms start to hurt, but he can work on machines for hours without problems. It comes as a wave. Gayland Curry ([facility]) thought it was related to C5-6 and C6-7 that was seen as a thinning on the radiographs/MRI. Pain comes 2-4 times a day. Just plain walking always provokes it. Lifting heavy things does not bother him. Pain is equally as bad to both arms and makes him want to cross his arms over chest. Has been taking gabapentin and may be helping. His pain was exacerbated while working on [employer] week when using his arms more but is feeling better now. Nothing else he has done so far has helped that he knows of. Used to awake him at night and throb but better since gabapentin. Started about 6 months ago. Came on all of a sudden. It made him afraid to go to bed. Goes away in 15-20 min (but usually stays up additional 2 hours) and then able to sleep. He did not think it was related to sleep but to what he had done during the  day prior to attempting to sleep. Has been getting better. Does have some problems with his L knee that has been helped with gel shots. Has a history of repeated R shoulder disclocations so he is quite careful with his R shoulder. Had a procedure done in 2006 to improve R shoulder stability that could be improved for ~ 5 years but it is still working. Cleared by cardiologist. Does have a long history of alcohol abuse. Is taking B 12 shots because they thought that may help. He has been getting a bit better. Retired but has hobbies: works on [employer], [place], has some acres so he is always working on [place] or things. DIY projects    Pertinent History Patient is a 74 y.o. male who presents to outpatient physical  therapy with a referral for medical diagnosis cervical radiculopathy. This patient's chief complaints consist of pain in bilateral UEs through the entire arm mostly provoked with walking more than 1.5 minutes or when he goes to bed leading to the following functional deficits: used to disturb sleep, causes trouble with walking and when arms are swinging (he is a pretty active guy). Relevant past medical history and comorbidities include pancreatitis, sleep apnea, obesity, anxiety, R shoulder surgery, L knee arthroscopy, hypertension, alcohol abuse (improved over last 2 months or so to almost no alcohol), B 12 deficiency.   Patient denies hx of cancer, stroke, seizures, lung problem, major cardiac events, diabetes, unexplained weight loss, changes in bowel or bladder problems, new onset stumbling. Does drop things more often.    How long can you walk comfortably? 1.5 min    Diagnostic tests MRI shows  narrowning at C5-6, and C6-7.    Patient Stated Goals "to not have that pain up and down my arms"    Currently in Pain? No/denies   Worst: 09-27-08 (up to 8-9/10).   Pain Score 0-No pain    Pain Location Arm   B arms   Pain Orientation Right;Left    Pain Descriptors / Indicators --   waves, almost a sickening thing like going to vomit, more aggrevation now   Pain Type Chronic pain    Pain Radiating Towards bilateral UEs (throughout both arms)    Pain Onset More than a month ago    Pain Frequency Intermittent   2-4x a day   Aggravating Factors  going to bed, having arms down, walking with arms down, swinging arms, too much activity with arms (doesn't think lying down does it)    Pain Relieving Factors crossing arms over chest, gabapentin    Effect of Pain on Daily Activities used to disturb sleep, causes trouble when arms are swinging. difficulty walking more than 1.5 minutes.              [facility] PT Assessment - 06/14/20 0001      Assessment   Medical Diagnosis cervical radiculopathy    Referring  Provider (PT) Hemang K. Manuella Ghazi (Neurology)    Onset Date/Surgical Date 12/14/19    Hand Dominance Right    Next MD Visit in 3 montsh    Prior Therapy none for this problem prioir to current episode of care      Precautions   Precautions None      Restrictions   Weight Bearing Restrictions No      Balance Screen   Has the patient fallen in the past 6 months No    Has the patient had a decrease in activity level because of a fear of falling?  No    Is the patient reluctant to leave their home because of a fear of falling?  No      Home Environment   Living Environment --   no concerns about getting around home environment     Prior Function   Level of [place] Retired    Leisure works on [employer], [place], has some acres so he is always working on [place] or things. DIY projects.      Cognition   Overall Cognitive Status Within Functional Limits for tasks assessed           OBJECTIVE  OBSERVATION/INSPECTION Posture: forward head, rounded shoulders, with significant hypertrophy of musculature at the anterior base of neck. Significant hyperkyphosis near the CT junction and milder hyperkyphosis at the mid thoracic spine. Overweight noted.  . Tremor: none . Muscle bulk: generally WFL with some atrophy at B thenar eminences.  . Skin: The incision sites appear to be healing well with no excessive redness, warmth, drainage or signs of infection present.   . Bed mobility: supine <> Sit and rolling WFL . Transfers: sit <> stand WFL . Gait: grossly WFL for household and short community ambulation except onset of concordant arm pain at 1.5 min and intensified at 2 min to want to rest. Does ambulate with forward head posture, but not more severe than observed generally.    NEUROLOGICAL  Upper Motor Neuron Screen Hoffman's negative bilaterally Dermatomes . C3-T1 appears equal and intact to light touch.  Myotomes . C3-T1 appears intact  Neurodynamic  Tests   . Supine passive ULNT test negative for concordant pain in median, ulnar, and radial distributions bilaterally. Did mention it was a bit hard to tell due to the stretching sensation he felt. But later clearly had onset of concordant symptoms that took a few minutes to resolve after walking. Did not produce anything with this response during ULNT.   SPINE MOTION  Cervical Spine AROM (with OP in all directions except protraction and retraction) *Indicates pain Flexion: = 57 Extension: = 41 Rotation: R= 60, L = 68. Side Flexion: R= 31, L = 29. Protraction = WFL Retraction = limited but asymptomatic.   PERIPHERAL JOINT MOTION (in degrees) Active Range of Motion (AROM) Comments: B UE WFL  MUSCLE PERFORMANCE (MMT):  Comments: B UE WFL considering previous R shoulder injury. No pattern of weakness or pain noted.   SPECIAL TESTS: Spurling's part A+B: negative bilaterally Axial compression: negative.  ACCESSORY MOTION:  - Hypomobile to CPA at cervical spine to mid thoracic spine and B UPA at cervical spine, but no reproduction of symptoms.   PALPATION: - Tension noted in the anterior and posterior cervical spine musculature but no reproduction of symptoms.   FUNCTIONAL/BALANCE TESTS: 2 Minutes ambulation (reported to produce concordant sign): onset of symptoms at 1.5 min and strong enough to want to stop and cross arms over chest to relieve symptoms at 2 min. Resolved in a few minutes of rest with arms crossed over chest. Arms swinging freely in self selected walk during ambulation.   EDUCATION/COGNITION: Patient is alert and oriented X 4.  Objective measurements completed on examination: See above findings.      PT Education - 06/14/20 1709    Education Details Exercise purpose/form. Self management techniques. Education on diagnosis, prognosis, POC, anatomy and physiology of current condition    Person(s) Educated Patient    Methods Explanation;Demonstration;Tactile  cues;Verbal cues    Comprehension Verbalized understanding;Returned demonstration;Verbal cues required;Tactile  cues required;Need further instruction            PT Short Term Goals - 06/14/20 1720      PT SHORT TERM GOAL #1   Title Be independent with initial home exercise program for self-management of symptoms.    Baseline Initial HEP to be provided at visit 2 as appropriate (06/14/2020);    Time 2    Period Weeks    Status New    Target Date 06/28/20             PT Long Term Goals - 06/14/20 1721      PT LONG TERM GOAL #1   Title Be independent with a long-term home exercise program for self-management of symptoms.    Baseline Initial HEP to be provided at visit 2 as appropriate (06/14/2020);    Time 12    Period Weeks    Status New   TARGET DATE FOR ALL LONG TERM GOALS: 09/06/2020     PT LONG TERM GOAL #2   Title Patient will be able to ambulate for at least 30 minutes without onset of arm pain to improve his functional mobility and improve ability to complete his daily tasks and hobbies without hinderance.    Baseline 1.5 minutes to onset (06/14/2020):    Time 12    Period Weeks    Status New      PT LONG TERM GOAL #3   Title Demonstrate improved FOTO score by 10 units to demonstrate improvement in overall condition and self-reported functional ability.    Baseline to be tested at visit 2 (06/14/2020);    Time 12    Period Weeks    Status New      PT LONG TERM GOAL #4   Title Patient will report no difficulty sleeping or going to bed due to concordant symptoms.    Baseline Used to awake him or prevent him from going to sleep (06/14/2020);    Time 12    Period Weeks    Status New                  Plan - 06/14/20 1727    Clinical Impression Statement Patient is a 74 y.o. male referred to outpatient physical therapy with a medical diagnosis of cervical radiculopathy who presents with signs and symptoms consistent with unclear cause of bilateral UE pain  that occurs when walking with arms swinging more than 1.5 minutes. Patient was negative with all cervical spine testing for radicular symptoms and neural tension and his pain was only reproduced with walking during today's initial evaluation. Will continue to consider possible cervical radiculopathy but will continue assessment for possible thoracic outlet syndrome or other disorder and refer back to physician or treat with PT as appropriate. Patient presents with significant joint stiffness, pain, posture, muscle tension, range of motion, and muscle performance (power/strength/endurance) impairments that are limiting ability to complete his usual activities such as walking more than 1.5 min, sleeping  without difficulty. Patient will benefit from skilled physical therapy intervention to address current body structure impairments and activity limitations to improve function and work towards goals set in current POC in order to return to prior level of function or maximal functional improvement.    Personal Factors and Comorbidities Age;Behavior Pattern;Comorbidity 3+;Past/Current Experience;Profession;Social Background;Fitness;Time since onset of injury/illness/exacerbation    Comorbidities Relevant past medical history and comorbidities include pancreatitis, sleep apnea, obesity, anxiety, R shoulder surgery, L knee arthroscopy, hypertension, alcohol abuse (improved over last 2  months or so to almost no alcohol), B 12 deficiency.    Examination-Activity Limitations Locomotion Level;Sleep   general use of B UEs   Examination-Participation Restrictions Church;Community Activity;Interpersonal Relationship    Stability/Clinical Decision Making Evolving/Moderate complexity    Clinical Decision Making Moderate    Rehab Potential Fair    PT Frequency 2x / week    PT Duration 12 weeks    PT Treatment/Interventions ADLs/Self Care Home Management;Moist Heat;Electrical Stimulation;Traction;Cryotherapy;Gait  training;Therapeutic activities;Therapeutic exercise;Neuromuscular re-education;Patient/family education;Manual techniques;Dry needling;Passive range of motion;Spinal Manipulations;Joint Manipulations    PT Next Visit Plan assess FOTO, establish HEP, test for TOS, manual therapy    PT Home Exercise Plan TBD    Consulted and Agree with Plan of Care Patient           Patient will benefit from skilled therapeutic intervention in order to improve the following deficits and impairments:  Pain,Improper body mechanics,Decreased mobility,Increased muscle spasms,Postural dysfunction,Decreased activity tolerance,Decreased endurance,Decreased range of motion,Impaired perceived functional ability,Impaired UE functional use,Hypomobility,Difficulty walking,Impaired flexibility,Obesity  Visit Diagnosis: Pain in right arm  Pain in left arm  Radiculopathy, cervical region     Problem List Patient Active Problem List   Diagnosis Date Noted  . OSA on CPAP 12/14/2019  . Neck ache 12/14/2019  . Anxiety 12/14/2019  . Class 1 obesity due to excess calories with serious comorbidity and body mass index (BMI) of 32.0 to 32.9 in adult 12/14/2019   Clarise Cruz R. Graylon Good, PT, DPT 06/14/20, 5:29 PM  [facility] [address] [place], [address] Phone: [phone]   Fax:  [phone]  Name: MAKAIL ESSA MRN: [id number] Date of Birth: April 26, 1946

## 2020-06-16 ENCOUNTER — Ambulatory Visit: Payer: Medicare Other | Admitting: Physical Therapy

## 2020-06-16 ENCOUNTER — Other Ambulatory Visit: Payer: Self-pay

## 2020-06-16 DIAGNOSIS — M79602 Pain in left arm: Secondary | ICD-10-CM

## 2020-06-16 DIAGNOSIS — M79601 Pain in right arm: Secondary | ICD-10-CM

## 2020-06-16 DIAGNOSIS — G5603 Carpal tunnel syndrome, bilateral upper limbs: Secondary | ICD-10-CM | POA: Diagnosis not present

## 2020-06-16 DIAGNOSIS — M5412 Radiculopathy, cervical region: Secondary | ICD-10-CM

## 2020-06-16 NOTE — Therapy (Signed)
[facility] [address] [place], [address] Phone: [phone]   Fax:  [phone]  Physical Therapy Treatment  Patient Details  Name: Daniel Holden MRN: [id number] Date of Birth: 03-24-1946 Referring Provider (PT): Hemang K. Manuella Ghazi (Neurology)   Encounter Date: 06/16/2020   PT End of Session - 06/16/20 1909    Visit Number 2    Number of Visits 24    Date for PT Re-Evaluation 09/06/20    Authorization Type Medicare reporting period from 06/14/2020    Progress Note Due on Visit 10    PT Start Time 1650    PT Stop Time 1730    PT Time Calculation (min) 40 min    Activity Tolerance Patient tolerated treatment well    Behavior During Therapy [facility] for tasks assessed/performed           Past Medical History:  Diagnosis Date  . Allergy   . Anxiety   . Hypertension   . Pancreatitis 2015  . Sleep apnea     Past Surgical History:  Procedure Laterality Date  . APPENDECTOMY    . KNEE ARTHROSCOPY WITH MEDIAL MENISECTOMY Left 06/25/2019   Procedure: KNEE ARTHROSCOPY WITH MEDIAL MENISECTOMY;  Surgeon: Thornton Park, MD;  Location: [facility] ORS;  Service: Orthopedics;  Laterality: Left;  . SHOULDER SURGERY    . TONSILLECTOMY      There were no vitals filed for this visit.   Subjective Assessment - 06/16/20 1651    Subjective Patient reports he felt okay following last treatment session until 10:10pm when he was awoken by the pain that was much more severe as it has been lately. He was able to sit for 2 hours in a recliner holding a book while it gradually got better. He was able to go back to bed without further disruption. He reports he had pain all through the day on Wednesday and he took an extra gabapentin. He was able to sleep okay last night with an extra gabapentin. Today (Thursday) he felt okay until he walked across the parking lot a couple of times at [employer] and it caused pain in his arms as  usual. He was going to [employer] to get his shingles vaccine. It is currently calming down from  that. Did not get the vaccine. When asked, patient states he does have a lot of difficulty swallowing at times and his doctor told him it is because he has a short neck and a short pallate and there was no further discussion of that. Patient reports he does not really have neck pain. After the intense symptoms following last session he feels more unsure than ever what is going on.    Pertinent History Patient is a 74 y.o. male who presents to outpatient physical therapy with a referral for medical diagnosis cervical radiculopathy. This patient's chief complaints consist of pain in bilateral UEs through the entire arm mostly provoked with walking more than 1.5 minutes or when he goes to bed leading to the following functional deficits: used to disturb sleep, causes trouble with walking and when arms are swinging (he is a pretty active guy). Relevant past medical history and comorbidities include pancreatitis, sleep apnea, obesity, anxiety, R shoulder surgery, L knee arthroscopy, hypertension, alcohol abuse (improved over last 2 months or so to almost no alcohol), B 12 deficiency.   Patient denies hx of cancer, stroke, seizures, lung problem, major cardiac events, diabetes, unexplained weight loss, changes in  bowel or bladder problems, new onset stumbling. Does drop things more often.    How long can you walk comfortably? 1.5 min    Diagnostic tests MRI shows  narrowning at C5-6, and C6-7.    Patient Stated Goals "to not have that pain up and down my arms"    Currently in Pain? Other (Comment)    Pain Onset More than a month ago           OBJECTIVE  OBSERVATION: Fullness noted over B anterolateral neck and superior to clavicle R > L.   SPECIAL TESTS:  For Thoracic Outlet syndrome (did not consider loss of pulse positive):  Adson's Test (implicates scalene triangle):   Negative bilaterally for  symptoms or loss of pulse Costoclavicular Maneuver (implicates costoclavicular space):   Tingling in bilateral fingers, no concordant arm pain, no loss of pulse Wright's Test (implicates thoraco-coraco-pectoral gate):   Tingling in bilateral fingers, no concordant arm pain, no loss of pulse Elevated Arm Stress Test (thoracic outlet container loading)  Tingling in bilateral hands and pain at top of both shoulders, but no concordant pain; able to maintain position for full 3 minutes.  Cyriax Release Test (thoracic outlet container release)  Negative bilateraly  ACCESSORY MOTION Accessory glide to first rib in supine:   No provocation with caudal glide bilaterally, not asymetric.   No change in symptoms with manual distraction to the cervical spine.  B AC and [place] joint appear WNL, some possible hypertrophy to the bone palpated along mid clavicle B.    TREATMENT:  Therapeutic exercise:to centralize symptoms and improve ROM, strength, muscular endurance, and activity tolerance required for successful completion of functional activities. - continued testing/assessment for thoracic outlet syndrome (see above)  Manual therapy: to reduce pain and tissue tension, improve range of motion, neuromodulation, in order to promote improved ability to complete functional activities. Supine position:  - assessment of accessory motion at B AC and [place] joints as well as first rib (see above).  - L first rib caudal mobilization with breath, 1x5 each side - STM to the posterior cervical spine musculature including paraspinals, UTs, levator scap, suboccipitals - manual distraction to cervical spine, intermittent  Pt required multimodal cuing for proper technique and to facilitate improved neuromuscular control, strength, range of motion, and functional ability resulting in improved performance and form.    PT Education - 06/16/20 1909    Education Details clinical tests, response to testing     Person(s) Educated Patient    Methods Explanation    Comprehension Verbalized understanding            PT Short Term Goals - 06/14/20 1720      PT SHORT TERM GOAL #1   Title Be independent with initial home exercise program for self-management of symptoms.    Baseline Initial HEP to be provided at visit 2 as appropriate (06/14/2020);    Time 2    Period Weeks    Status New    Target Date 06/28/20             PT Long Term Goals - 06/14/20 1721      PT LONG TERM GOAL #1   Title Be independent with a long-term home exercise program for self-management of symptoms.    Baseline Initial HEP to be provided at visit 2 as appropriate (06/14/2020);    Time 12    Period Weeks    Status New   TARGET DATE FOR ALL LONG TERM GOALS: 09/06/2020  PT LONG TERM GOAL #2   Title Patient will be able to ambulate for at least 30 minutes without onset of arm pain to improve his functional mobility and improve ability to complete his daily tasks and hobbies without hinderance.    Baseline 1.5 minutes to onset (06/14/2020):    Time 12    Period Weeks    Status New      PT LONG TERM GOAL #3   Title Demonstrate improved FOTO score by 10 units to demonstrate improvement in overall condition and self-reported functional ability.    Baseline to be tested at visit 2 (06/14/2020);    Time 12    Period Weeks    Status New      PT LONG TERM GOAL #4   Title Patient will report no difficulty sleeping or going to bed due to concordant symptoms.    Baseline Used to awake him or prevent him from going to sleep (06/14/2020);    Time 12    Period Weeks    Status New                 Plan - 06/16/20 1907    Clinical Impression Statement Patient's examination was continued to test for Thoracic Outlet Syndrome this session due to lack of in clinic reproduction of symptoms with cervical spine tests last session. Although patient did have some reproduction of hand paresthesia with testing, it did not  resemble his concordant arm pain with any of the tests and his pulses stayed strong bilaterally for those tests with a vascular measurement, implying that patient does not have TOS as a cause of his chief complaint. Did not do any purposeful neurodynamic testing today. It seems most likely that doing that testing at initial eval may have increased symptoms later in the day due to reaction to the tensioned nerves. Not performing it this session and monitoring his response over the next few days may confirm this. Patient did respond to initial eval with increased symptoms that were delayed, suggesting that something tested was provocative and related to his complaint. May test walking with arms crossed over chest or seated aerobic exercise to attempt to bring on symptoms next session or further evaluate any swelling in his arms with standing or seated arm swings or hangs to assess that as provocative. No HEP provided today due to inconclusive conclusions on what may help alleviate his symptoms. Patient would benefit from continued management of limiting condition by skilled physical therapist to address remaining impairments and functional limitations to work towards stated goals and return to PLOF or maximal functional independence.    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    Examination-Activity Limitations Locomotion Level;Sleep   general use of B UEs   Examination-Participation Restrictions Church;Community Activity;Interpersonal Relationship    Stability/Clinical Decision Making Evolving/Moderate complexity     Rehab Potential Fair    PT Frequency 2x / week    PT Duration 12 weeks    PT Treatment/Interventions ADLs/Self Care Home Management;Moist Heat;Electrical Stimulation;Traction;Cryotherapy;Gait training;Therapeutic activities;Therapeutic exercise;Neuromuscular re-education;Patient/family education;Manual techniques;Dry needling;Passive range of motion;Spinal Manipulations;Joint Manipulations    PT Next Visit Plan assess FOTO (unsure what body part to use), establish HEP when appropriate, consider manual therapy, further assess  for source of symptoms (possibly isolating arm swing or not with and without various aerobic stress in various positions).    PT Home Exercise Plan TBD    Consulted and Agree with Plan of Care Patient           Patient will benefit from skilled therapeutic intervention in order to improve the following deficits and impairments:  Pain,Improper body mechanics,Decreased mobility,Increased muscle spasms,Postural dysfunction,Decreased activity tolerance,Decreased endurance,Decreased range of motion,Impaired perceived functional ability,Impaired UE functional use,Hypomobility,Difficulty walking,Impaired flexibility,Obesity  Visit Diagnosis: Pain in right arm  Pain in left arm  Radiculopathy, cervical region     Problem List Patient Active Problem List   Diagnosis Date Noted  . OSA on CPAP 12/14/2019  . Neck ache 12/14/2019  . Anxiety 12/14/2019  . Class 1 obesity due to excess calories with serious comorbidity and body mass index (BMI) of 32.0 to 32.9 in adult 12/14/2019    Clarise Cruz R. Graylon Good, PT, DPT 06/16/20, 7:11 PM  [facility] [address], [place], [address] Phone: [phone]   Fax:  [phone]  Name: Daniel Holden MRN: [id number] Date of Birth: 1946/02/03

## 2020-06-21 ENCOUNTER — Ambulatory Visit: Payer: Medicare Other

## 2020-06-21 ENCOUNTER — Other Ambulatory Visit: Payer: Self-pay

## 2020-06-21 ENCOUNTER — Encounter: Payer: Self-pay | Admitting: Physical Therapy

## 2020-06-21 DIAGNOSIS — M79602 Pain in left arm: Secondary | ICD-10-CM | POA: Diagnosis not present

## 2020-06-21 DIAGNOSIS — M79601 Pain in right arm: Secondary | ICD-10-CM | POA: Diagnosis not present

## 2020-06-21 DIAGNOSIS — G5603 Carpal tunnel syndrome, bilateral upper limbs: Secondary | ICD-10-CM | POA: Diagnosis not present

## 2020-06-21 DIAGNOSIS — M5412 Radiculopathy, cervical region: Secondary | ICD-10-CM | POA: Diagnosis not present

## 2020-06-21 NOTE — Therapy (Signed)
[facility] [address] [place], [address] Phone: [phone]   Fax:  [phone]  Physical Therapy Treatment  Patient Details  Name: Daniel Holden MRN: [id number] Date of Birth: 1946/05/25 Referring Provider (PT): Hemang K. Manuella Ghazi (Neurology)   Encounter Date: 06/21/2020   PT End of Session - 06/21/20 0939    Visit Number 3    Number of Visits 24    Date for PT Re-Evaluation 09/06/20    Authorization Type Medicare reporting period from 06/14/2020    PT Start Time 0945    PT Stop Time 1030    PT Time Calculation (min) 45 min    Activity Tolerance Patient tolerated treatment well    Behavior During Therapy [facility] for tasks assessed/performed           Past Medical History:  Diagnosis Date  . Allergy   . Anxiety   . Hypertension   . Pancreatitis 2015  . Sleep apnea     Past Surgical History:  Procedure Laterality Date  . APPENDECTOMY    . KNEE ARTHROSCOPY WITH MEDIAL MENISECTOMY Left 06/25/2019   Procedure: KNEE ARTHROSCOPY WITH MEDIAL MENISECTOMY;  Surgeon: Thornton Park, MD;  Location: [facility] ORS;  Service: Orthopedics;  Laterality: Left;  . SHOULDER SURGERY    . TONSILLECTOMY      There were no vitals filed for this visit.   Subjective Assessment - 06/21/20 0947    Subjective Patient reported he has been doing okay, but did have a flare up last night. Said he did some walking for a funeral, and worked on a door but didn't feel bad or have symptoms during the day. But last night at 11pm he reported it "hit him" so he got up, read for a couple of hours and then went back to bed. Definitely reported that he thinks walking and stairs really bother him, lasts about less than a minute and then it goes away. Does wonder if it could be the way he is sleeping, he is a side sleeper.    Pertinent History Patient is a 74 y.o. male who presents to outpatient physical therapy with a referral for medical  diagnosis cervical radiculopathy. This patient's chief complaints consist of pain in bilateral UEs through the entire arm mostly provoked with walking more than 1.5 minutes or when he goes to bed leading to the following functional deficits: used to disturb sleep, causes trouble with walking and when arms are swinging (he is a pretty active guy). Relevant past medical history and comorbidities include pancreatitis, sleep apnea, obesity, anxiety, R shoulder surgery, L knee arthroscopy, hypertension, alcohol abuse (improved over last 2 months or so to almost no alcohol), B 12 deficiency.   Patient denies hx of cancer, stroke, seizures, lung problem, major cardiac events, diabetes, unexplained weight loss, changes in bowel or bladder problems, new onset stumbling. Does drop things more often.    How long can you walk comfortably? 1.5 min    Diagnostic tests MRI shows  narrowning at C5-6, and C6-7.    Patient Stated Goals "to not have that pain up and down my arms"    Currently in Pain? No/denies   pt reported no pain currently, but is having pain when he gets up in the morning   Pain Location Arm   B arms   Pain Orientation Right;Left    Pain Descriptors / Indicators --   waves, almost like nausea, aggravating   Pain Type Chronic  pain    Pain Onset More than a month ago    Pain Frequency Intermittent   2-4x a day          TREATMENT:       Therapeutic exercise: to assess pt response to intervention, postural strengthening   At beginning of session: 2:40 tingling in hands, 3:10 concordant pain bilaterally in forearms 1-2/10 that continued to increase as he continued to walk. Did also report feeling it radiating in his chest   Asked to place hands in bakody's position once returned to sitting to see if symptoms resolved, pt reported it may have actually potentially increased his symptoms. After sitting ~5 mins, symptoms resolved.    Seated rows with RTB 2x10 at end of session, no complaints of  symptoms  Manual therapy: Manual traction performed x12 minutes in neutral and with R and L biasing to assess pt response post intervention.   Ambulated after: 1:50 sec reported some hand tingling, but also some tingling up in bilateral UTs 2:18 minutes pt reported forearm tingling, that it is more intense than previously at beginning of session  Pt response/clinical impression: Session focused on performing longer bouts of traction to assess pt response. Pt had no complaints of concordant symptoms during traction, eyes folded at rest across chest. Pt initially reported some centralization of symptoms (tingling in UT versus forearms) but symptoms set on more quickly, and transitioned to radiating into forearms and across his chest. PT and pt spent time during session as well discussing findings and POC. Next session PT to attempt CPA/UPAs to cervical spine, as well as potentially nerve glides to assess pt response.      PT Education - 06/21/20 [phone]    Education Details POC, interventions    Person(s) Educated Patient    Methods Explanation    Comprehension Verbalized understanding            PT Short Term Goals - 06/14/20 1720      PT SHORT TERM GOAL #1   Title Be independent with initial home exercise program for self-management of symptoms.    Baseline Initial HEP to be provided at visit 2 as appropriate (06/14/2020);    Time 2    Period Weeks    Status New    Target Date 06/28/20             PT Long Term Goals - 06/14/20 1721      PT LONG TERM GOAL #1   Title Be independent with a long-term home exercise program for self-management of symptoms.    Baseline Initial HEP to be provided at visit 2 as appropriate (06/14/2020);    Time 12    Period Weeks    Status New   TARGET DATE FOR ALL LONG TERM GOALS: 09/06/2020     PT LONG TERM GOAL #2   Title Patient will be able to ambulate for at least 30 minutes without onset of arm pain to improve his functional mobility and  improve ability to complete his daily tasks and hobbies without hinderance.    Baseline 1.5 minutes to onset (06/14/2020):    Time 12    Period Weeks    Status New      PT LONG TERM GOAL #3   Title Demonstrate improved FOTO score by 10 units to demonstrate improvement in overall condition and self-reported functional ability.    Baseline to be tested at visit 2 (06/14/2020);    Time 12    Period Weeks  Status New      PT LONG TERM GOAL #4   Title Patient will report no difficulty sleeping or going to bed due to concordant symptoms.    Baseline Used to awake him or prevent him from going to sleep (06/14/2020);    Time 12    Period Weeks    Status New                 Plan - 06/21/20 [id number]    Clinical Impression Statement Session focused on performing longer bouts of traction to assess pt response. Pt had no complaints of concordant symptoms during traction, eyes folded at rest across chest. Pt initially reported some centralization of symptoms (tingling in UT versus forearms) but symptoms set on more quickly, and transitioned to radiating into forearms and across his chest. PT and pt spent time during session as well discussing findings and POC. Next session PT to attempt CPA/UPAs to cervical spine, as well as potentially nerve glides to assess pt response.    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    Examination-Activity Limitations Locomotion Level;Sleep   general use of UEs   Examination-Participation Restrictions Church;Community Activity;Interpersonal Relationship     Stability/Clinical Decision Making Evolving/Moderate complexity    Rehab Potential Fair    PT Frequency 2x / week    PT Duration 12 weeks    PT Treatment/Interventions ADLs/Self Care Home Management;Moist Heat;Electrical Stimulation;Traction;Cryotherapy;Gait training;Therapeutic activities;Therapeutic exercise;Neuromuscular re-education;Patient/family education;Manual techniques;Dry needling;Passive range of motion;Spinal Manipulations;Joint Manipulations    PT Next Visit Plan assess FOTO (unsure what body part to use), establish HEP when appropriate, consider manual therapy, further assess for source of symptoms (possibly isolating arm swing or not with and without various aerobic stress in various positions).    PT Home Exercise Plan TBD           Patient will benefit from skilled therapeutic intervention in order to improve the following deficits and impairments:  Pain,Improper body mechanics,Decreased mobility,Increased muscle spasms,Postural dysfunction,Decreased activity tolerance,Decreased endurance,Decreased range of motion,Impaired perceived functional ability,Impaired UE functional use,Hypomobility,Difficulty walking,Impaired flexibility,Obesity  Visit Diagnosis: Pain in right arm  Pain in left arm  Radiculopathy, cervical region     Problem List Patient Active Problem List   Diagnosis Date Noted  . OSA on CPAP 12/14/2019  . Neck ache 12/14/2019  . Anxiety 12/14/2019  . Class 1 obesity due to excess calories with serious comorbidity and body mass index (BMI) of 32.0 to 32.9 in adult 12/14/2019    Lieutenant Diego PT, DPT 10:42 AM,06/21/20   [facility] [address] [place], [address] Phone: [phone]   Fax:  [phone]  Name: JERYN GALLIGHER MRN: [id number] Date of Birth: January 12, 1946

## 2020-06-23 ENCOUNTER — Other Ambulatory Visit: Payer: Self-pay

## 2020-06-23 ENCOUNTER — Ambulatory Visit: Payer: Medicare Other

## 2020-06-23 ENCOUNTER — Encounter: Payer: Self-pay | Admitting: Physical Therapy

## 2020-06-23 DIAGNOSIS — M79601 Pain in right arm: Secondary | ICD-10-CM | POA: Diagnosis not present

## 2020-06-23 DIAGNOSIS — M5412 Radiculopathy, cervical region: Secondary | ICD-10-CM

## 2020-06-23 DIAGNOSIS — G5603 Carpal tunnel syndrome, bilateral upper limbs: Secondary | ICD-10-CM

## 2020-06-23 DIAGNOSIS — M79602 Pain in left arm: Secondary | ICD-10-CM | POA: Diagnosis not present

## 2020-06-23 NOTE — Therapy (Signed)
[facility] PHYSICAL AND SPORTS MEDICINE [address], [place], [address] Phone: [phone]   Fax:  [phone]  Physical Therapy Treatment  Patient Details  Name: Daniel Holden MRN: [id number] Date of Birth: [birth year] Referring Provider (PT): Hemang K. Sherryll Burger (Neurology)   Encounter Date: 06/23/2020   PT End of Session - 06/23/20 0944    Visit Number 4    Number of Visits 24    Date for PT Re-Evaluation 09/06/20    Authorization Type Medicare reporting period from 06/14/2020    PT Start Time 0945    PT Stop Time 1040    PT Time Calculation (min) 55 min    Activity Tolerance Patient tolerated treatment well    Behavior During Therapy [facility] for tasks assessed/performed           Past Medical History:  Diagnosis Date  . Allergy   . Anxiety   . Hypertension   . Pancreatitis 2015  . Sleep apnea     Past Surgical History:  Procedure Laterality Date  . APPENDECTOMY    . KNEE ARTHROSCOPY WITH MEDIAL MENISECTOMY Left 06/25/2019   Procedure: KNEE ARTHROSCOPY WITH MEDIAL MENISECTOMY;  Surgeon: Juanell Fairly, MD;  Location: [facility] ORS;  Service: Orthopedics;  Laterality: Left;  . SHOULDER SURGERY    . TONSILLECTOMY      There were no vitals filed for this visit.   Subjective Assessment - 06/23/20 0943    Subjective Patient reported that all day yesterday, it would ache, come and go. Did state he did sleep well last night. He reported in the morning the first 15-20 minutes in the morning is what causes to start hurting, but when he's done doing those motions it stops hurting. Does think it was a bit more flared up after PT session than it normally is, 1-2/10.    Pertinent History Patient is a 74 y.o. male who presents to outpatient physical therapy with a referral for medical diagnosis cervical radiculopathy. This patient's chief complaints consist of pain in bilateral UEs through the entire arm mostly provoked with walking more  than 1.5 minutes or when he goes to bed leading to the following functional deficits: used to disturb sleep, causes trouble with walking and when arms are swinging (he is a pretty active guy). Relevant past medical history and comorbidities include pancreatitis, sleep apnea, obesity, anxiety, R shoulder surgery, L knee arthroscopy, hypertension, alcohol abuse (improved over last 2 months or so to almost no alcohol), B 12 deficiency.   Patient denies hx of cancer, stroke, seizures, lung problem, major cardiac events, diabetes, unexplained weight loss, changes in bowel or bladder problems, new onset stumbling. Does drop things more often.    How long can you walk comfortably? 1.5 min    Diagnostic tests MRI shows  narrowning at C5-6, and C6-7.    Patient Stated Goals "to not have that pain up and down my arms"             TREATMENT:     Therapeutic exercise: to assess pt response to intervention, postural strengthening    At beginning of session: symptoms started after 2:30mins, tingling in hands, 2:40sec concordant pain bilaterally in forearms 1-2/10 that continued to increase as he continued to walk. Did also report feeling it radiating in his chest.   After interventions, hand tingling at started at 1:25min, full symptoms by 2:37 seconds; more intense than start of session    Seated rows with RTB 2x10 at  end of session, no complaints of symptoms Chin tucks 15x3 seconds holds in supine Seated median nerve glides x5 for technique. HEP administered    Manual therapy: CPA grade II-III to cervical spine, 3 bouts x 30 seconds ea level, 2-3 extra bouts C5-C6 due to pt reported potential mild reproduction of concordant symptoms in his hands UPA bilaterally grade III cervical spine 2 bouts x 20 seconds per level, 3 extra bouts level C5-C6, pt with pt reported potentially mild reproduction of concordant symptoms in his hands     Pt response/clinical impression: Session focused on mobilizations of  C spine to assess pt response. Pt reported potential concordant signs with mobilizations of C5-C6, but had difficulty reporting whether it was truly reproducing symptoms since the feelings were not as intense as he normally feels. Time spent as well on education about sleeping positions to promote neutral spine alignment, as well as importance of postural strengthening, HEP updated for scapular rows and gentle median nerve glides. Pt instructed to perform nerve glides in the next 48 - 72 hours to assess his response, instructed to terminate if greatly irritated his symptoms.     Access Code: [email or web address] URL: [email or web address] Date: 06/23/2020 Prepared by: Lieutenant Diego  Exercises Standing Shoulder Row with Anchored Resistance - 1 x daily - 7 x weekly - 3 sets - 10 reps Median Nerve Flossing - 1 x daily - 7 x weekly - 3 sets - 10 reps    PT Education - 06/23/20 0943    Education Details therapeutic exercise, interventions    Person(s) Educated Patient    Methods Explanation    Comprehension Verbalized understanding            PT Short Term Goals - 06/14/20 1720      PT SHORT TERM GOAL #1   Title Be independent with initial home exercise program for self-management of symptoms.    Baseline Initial HEP to be provided at visit 2 as appropriate (06/14/2020);    Time 2    Period Weeks    Status New    Target Date 06/28/20             PT Long Term Goals - 06/14/20 1721      PT LONG TERM GOAL #1   Title Be independent with a long-term home exercise program for self-management of symptoms.    Baseline Initial HEP to be provided at visit 2 as appropriate (06/14/2020);    Time 12    Period Weeks    Status New   TARGET DATE FOR ALL LONG TERM GOALS: 09/06/2020     PT LONG TERM GOAL #2   Title Patient will be able to ambulate for at least 30 minutes without onset of arm pain to improve his functional mobility and improve ability to complete his daily tasks and  hobbies without hinderance.    Baseline 1.5 minutes to onset (06/14/2020):    Time 12    Period Weeks    Status New      PT LONG TERM GOAL #3   Title Demonstrate improved FOTO score by 10 units to demonstrate improvement in overall condition and self-reported functional ability.    Baseline to be tested at visit 2 (06/14/2020);    Time 12    Period Weeks    Status New      PT LONG TERM GOAL #4   Title Patient will report no difficulty sleeping or going to bed due to concordant symptoms.    Baseline  Used to awake him or prevent him from going to sleep (06/14/2020);    Time 12    Period Weeks    Status New                 Plan - 06/23/20 0944    Clinical Impression Statement Session focused on mobilizations of C spine to assess pt response. Pt reported potential concordant signs with mobilizations of C5-C6, but had difficulty reporting whether it was truly reproducing symptoms since the feelings were not as intense as he normally feels. Time spent as well on education about sleeping positions to promote neutral spine alignment, as well as importance of postural strengthening, HEP updated for scapular rows and gentle median nerve glides. Pt instructed to perform nerve glides in the next 48 - 72 hours to assess his response, instructed to terminate if greatly irritated his symptoms.    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    Examination-Activity Limitations Locomotion Level;Sleep   general use of UEs   Examination-Participation Restrictions Church;Community Activity;Interpersonal  Relationship    Stability/Clinical Decision Making Evolving/Moderate complexity    Rehab Potential Fair    PT Frequency 2x / week    PT Duration 12 weeks    PT Treatment/Interventions ADLs/Self Care Home Management;Moist Heat;Electrical Stimulation;Traction;Cryotherapy;Gait training;Therapeutic activities;Therapeutic exercise;Neuromuscular re-education;Patient/family education;Manual techniques;Dry needling;Passive range of motion;Spinal Manipulations;Joint Manipulations    PT Next Visit Plan assess FOTO (unsure what body part to use), establish HEP when appropriate, consider manual therapy, further assess for source of symptoms (possibly isolating arm swing or not with and without various aerobic stress in various positions).    PT Home Exercise Plan [id number] (rows, gentle median nerve glides)    Consulted and Agree with Plan of Care Patient           Patient will benefit from skilled therapeutic intervention in order to improve the following deficits and impairments:  Pain,Improper body mechanics,Decreased mobility,Increased muscle spasms,Postural dysfunction,Decreased activity tolerance,Decreased endurance,Decreased range of motion,Impaired perceived functional ability,Impaired UE functional use,Hypomobility,Difficulty walking,Impaired flexibility,Obesity  Visit Diagnosis: Pain in right arm  Pain in left arm  Radiculopathy, cervical region  Carpal tunnel syndrome, bilateral upper limbs     Problem List Patient Active Problem List   Diagnosis Date Noted  . OSA on CPAP 12/14/2019  . Neck ache 12/14/2019  . Anxiety 12/14/2019  . Class 1 obesity due to excess calories with serious comorbidity and body mass index (BMI) of 32.0 to 32.9 in adult 12/14/2019    Olga Coaster PT, DPT 10:58 AM,06/23/20   [facility] [facility] PHYSICAL AND SPORTS MEDICINE [address], [place], [address] Phone: [phone]   Fax:  [phone]  Name: Daniel Holden MRN: [id number] Date of Birth: 03/04/1946

## 2020-06-27 ENCOUNTER — Encounter: Payer: Medicare Other | Admitting: Physical Therapy

## 2020-06-28 ENCOUNTER — Other Ambulatory Visit: Payer: Self-pay

## 2020-06-28 ENCOUNTER — Encounter: Payer: Self-pay | Admitting: Physical Therapy

## 2020-06-28 ENCOUNTER — Ambulatory Visit: Payer: Medicare Other | Attending: Neurology | Admitting: Physical Therapy

## 2020-06-28 DIAGNOSIS — M79601 Pain in right arm: Secondary | ICD-10-CM | POA: Diagnosis not present

## 2020-06-28 DIAGNOSIS — M79602 Pain in left arm: Secondary | ICD-10-CM | POA: Diagnosis not present

## 2020-06-28 DIAGNOSIS — M5412 Radiculopathy, cervical region: Secondary | ICD-10-CM | POA: Insufficient documentation

## 2020-06-28 NOTE — Therapy (Signed)
[place] PHYSICAL AND SPORTS MEDICINE [address], [place], [address] Phone: [phone]   Fax:  [phone]  Physical Therapy Treatment  Patient Details  Name: Daniel Holden MRN: [id number] Date of Birth: 1946/05/19 Referring Provider (PT): Hemang K. Manuella Ghazi (Neurology)   Encounter Date: 06/28/2020   PT End of Session - 06/28/20 1002    Visit Number 5    Number of Visits 24    Date for PT Re-Evaluation 09/06/20    Authorization Type Medicare reporting period from 06/14/2020    Progress Note Due on Visit 10    PT Start Time 0900    PT Stop Time 0950    PT Time Calculation (min) 50 min    Activity Tolerance Patient tolerated treatment well    Behavior During Therapy [facility] for tasks assessed/performed           Past Medical History:  Diagnosis Date  . Allergy   . Anxiety   . Hypertension   . Pancreatitis 2015  . Sleep apnea     Past Surgical History:  Procedure Laterality Date  . APPENDECTOMY     Patient states he did not have an appendectomy and does not know why this is in his chart. [facility] 06/28/2020  . KNEE ARTHROSCOPY WITH MEDIAL MENISECTOMY Left 06/25/2019   Procedure: KNEE ARTHROSCOPY WITH MEDIAL MENISECTOMY;  Surgeon: Thornton Park, MD;  Location: [facility] ORS;  Service: Orthopedics;  Laterality: Left;  . SHOULDER SURGERY    . TONSILLECTOMY      There were no vitals filed for this visit.   Subjective Assessment - 06/28/20 0902    Subjective Patient reports he is feeling fine overall today but he has not had any significant change in symptoms since starting PT. He has no pain/symptoms currently. He did okay with the nerve glide he started after last treatment session, rows, and cervical retraction. He has questions about his care. Reports cardiologist did stress test (no symptoms until resting after test), EKG, echcardiogram. All negative. Patient reports he saw Appendectomy in his PT note as part of his medical history,  but he reports he did not undergo this procedure.    Pertinent History Patient is a 75 y.o. male who presents to outpatient physical therapy with a referral for medical diagnosis cervical radiculopathy. This patient's chief complaints consist of pain in bilateral UEs through the entire arm mostly provoked with walking more than 1.5 minutes or when he goes to bed leading to the following functional deficits: used to disturb sleep, causes trouble with walking and when arms are swinging (he is a pretty active guy). Relevant past medical history and comorbidities include pancreatitis, sleep apnea, obesity, anxiety, R shoulder surgery, L knee arthroscopy, hypertension, alcohol abuse (improved over last 2 months or so to almost no alcohol), B 12 deficiency.   Patient denies hx of cancer, stroke, seizures, lung problem, major cardiac events, diabetes, unexplained weight loss, changes in bowel or bladder problems, new onset stumbling. Does drop things more often.    How long can you walk comfortably? 1.5 min    Diagnostic tests MRI shows  narrowning at C5-6, and C6-7.    Patient Stated Goals "to not have that pain up and down my arms"    Currently in Pain? No/denies   not at rest            TREATMENT:      Therapeutic exercise:toassess pt response to intervention and attempt to further isolate trigger for  symptoms.   Tested for symptom onset and location with the following activities, changing limited variables to help isolate when symptoms do and don't occur and severity. Starting each asymptomatic (except occasionally residual mild tingling in fingers):   - nustep: seat setting 10, level 4, hands resting on thighs, 95 average steps per minute: 1:53 min hands tingling. 2:30 min symptoms started everywhere, up and down arms and across chest. Rated symptoms/pain 2-3/10.   HR 77 bpm, SpO2 98% on beta blocker. Discontinued exercise for seated rest with symptoms resolving to just hands tingling at  6:30 min (timed from start of exercise).   - Walking hands at chest: 2:14 min symptoms starting at chest, stayed constant there until stopped walking at ~ 6 min. Upon seated rest directly after walking, pt put hands down when sitting and said he could feel symptoms throughout arms.   (Patient notes that chest symptom quality seems different and possibly separate than arm symptom quality but onset correlates. Chest more tight. Arms more electrical in feeling.)   - Nustep  seat setting 11, level 4, hands across chest: Twinge in chest at 4:46 min and remained constant. Hands down: progressively increased symptoms from hands towards forearms at 6 min while chest symptoms remained constant.   - Sitting quietly with arms dangling at sides: fingers tingling a bit at 4 min. Discontinued at 6 min with slight finger tingling (no advancement of symptoms).   Pt response/clinical impression: Session focused on continued assessment positional and activity effect on symptoms, attempting to isolate whether either of these have an effect. Chest symptoms appear to appear consistently with exertion. Arm/hand symptoms appear worsened when at sides and better at chest. Would be concerned for cardiac source of symptoms at this point but patient reports he had negative work up with cardiologist including stress test. Chest pain does seem to correlate with arms symptoms but may be more related to exertion and arms to position. Consider respiratory contributor or use of accessory respiratory muscles during exertion as contributing factor. Patient appears to be tolerating HEP well but has not yet had any significant improvement in symptoms overall. Plan to return to interventions for cervical/thoracic spine, posture, and neurodynamic next session for trial of several sessions focused on this area to see if improvement can be made before referring for further medical assessment. May also consider peripheral nerve entrapment at  future date if appropriate. Patient would benefit from continued management of limiting condition by skilled physical therapist to address remaining impairments and functional limitations to work towards stated goals and return to PLOF or maximal functional independence.   HOME EXERCISE PROGRAM  Access Code: [email or web address] URL: [email or web address] Date: 06/23/2020 Prepared by: Lieutenant Diego  Exercises Standing Shoulder Row with Anchored Resistance - 1 x daily - 7 x weekly - 3 sets - 10 reps Median Nerve Flossing - 1 x daily - 7 x weekly - 3 sets - 10 reps     PT Education - 06/28/20 1002    Education Details therapeutic exercise, interventions, POC, anatomy and physiology of condition and rationale for assessment.    Person(s) Educated Patient    Methods Explanation;Demonstration;Verbal cues    Comprehension Verbalized understanding;Returned demonstration;Verbal cues required;Need further instruction            PT Short Term Goals - 06/28/20 1021      PT SHORT TERM GOAL #1   Title Be independent with initial home exercise program for self-management of symptoms.    Baseline Initial HEP to  be provided at visit 2 as appropriate (06/14/2020);    Time 2    Period Weeks    Status Achieved    Target Date 06/28/20             PT Long Term Goals - 06/14/20 1721      PT LONG TERM GOAL #1   Title Be independent with a long-term home exercise program for self-management of symptoms.    Baseline Initial HEP to be provided at visit 2 as appropriate (06/14/2020);    Time 12    Period Weeks    Status New   TARGET DATE FOR ALL LONG TERM GOALS: 09/06/2020     PT LONG TERM GOAL #2   Title Patient will be able to ambulate for at least 30 minutes without onset of arm pain to improve his functional mobility and improve ability to complete his daily tasks and hobbies without hinderance.    Baseline 1.5 minutes to onset (06/14/2020):    Time 12    Period Weeks    Status New       PT LONG TERM GOAL #3   Title Demonstrate improved FOTO score by 10 units to demonstrate improvement in overall condition and self-reported functional ability.    Baseline to be tested at visit 2 (06/14/2020);    Time 12    Period Weeks    Status New      PT LONG TERM GOAL #4   Title Patient will report no difficulty sleeping or going to bed due to concordant symptoms.    Baseline Used to awake him or prevent him from going to sleep (06/14/2020);    Time 12    Period Weeks    Status New                 Plan - 06/28/20 1020    Clinical Impression Statement Pt response/clinical impression: Session focused on continued assessment positional and activity effect on symptoms, attempting to isolate whether either of these have an effect. Chest symptoms appear to appear consistently with exertion. Arm/hand symptoms appear worsened when at sides and better at chest. Would be concerned for cardiac source of symptoms at this point but patient reports he had negative work up with cardiologist including stress test. Chest pain does seem to correlate with arms symptoms but may be more related to exertion and arms to position. Consider respiratory contributor or use of accessory respiratory muscles during exertion as contributing factor. Patient appears to be tolerating HEP well but has not yet had any significant improvement in symptoms overall. Plan to return to interventions for cervical/thoracic spine, posture, and neurodynamic next session for trial of several sessions focused on this area to see if improvement can be made before referring for further medical assessment. May also consider peripheral nerve entrapment at future date if appropriate. Patient would benefit from continued management of limiting condition by skilled physical therapist to address remaining impairments and functional limitations to work towards stated goals and return to PLOF or maximal functional independence.     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    Examination-Activity Limitations Locomotion Level;Sleep   general use of UEs   Examination-Participation Restrictions Church;Community Activity;Interpersonal Relationship  Stability/Clinical Decision Making Evolving/Moderate complexity    Rehab Potential Fair    PT Frequency 2x / week    PT Duration 12 weeks    PT Treatment/Interventions ADLs/Self Care Home Management;Moist Heat;Electrical Stimulation;Traction;Cryotherapy;Gait training;Therapeutic activities;Therapeutic exercise;Neuromuscular re-education;Patient/family education;Manual techniques;Dry needling;Passive range of motion;Spinal Manipulations;Joint Manipulations    PT Next Visit Plan assess FOTO (unsure what body part to use), establish HEP when appropriate, consider manual therapy, further assess for source of symptoms (possibly isolating arm swing or not with and without various aerobic stress in various positions).    PT Home Exercise Plan [email or web address] (rows, gentle median nerve glides)    Consulted and Agree with Plan of Care Patient           Patient will benefit from skilled therapeutic intervention in order to improve the following deficits and impairments:  Pain,Improper body mechanics,Decreased mobility,Increased muscle spasms,Postural dysfunction,Decreased activity tolerance,Decreased endurance,Decreased range of motion,Impaired perceived functional ability,Impaired UE functional use,Hypomobility,Difficulty walking,Impaired flexibility,Obesity  Visit Diagnosis: Pain in right arm  Pain in  left arm  Radiculopathy, cervical region     Problem List Patient Active Problem List   Diagnosis Date Noted  . OSA on CPAP 12/14/2019  . Neck ache 12/14/2019  . Anxiety 12/14/2019  . Class 1 obesity due to excess calories with serious comorbidity and body mass index (BMI) of 32.0 to 32.9 in adult 12/14/2019    Daniel Holden, PT, DPT 06/28/20, 10:27 AM  [place] [facility] PHYSICAL AND SPORTS MEDICINE [address] [place], [address] Phone: [phone]   Fax:  [phone]  Name: Daniel Holden MRN: [id number] Date of Birth: [birth year]

## 2020-06-30 ENCOUNTER — Other Ambulatory Visit: Payer: Self-pay

## 2020-06-30 ENCOUNTER — Ambulatory Visit: Payer: Medicare Other

## 2020-06-30 ENCOUNTER — Ambulatory Visit: Payer: Medicare Other | Admitting: Physical Therapy

## 2020-06-30 DIAGNOSIS — M79602 Pain in left arm: Secondary | ICD-10-CM | POA: Diagnosis not present

## 2020-06-30 DIAGNOSIS — M5412 Radiculopathy, cervical region: Secondary | ICD-10-CM | POA: Diagnosis not present

## 2020-06-30 DIAGNOSIS — M79601 Pain in right arm: Secondary | ICD-10-CM

## 2020-06-30 NOTE — Therapy (Signed)
[facility] PHYSICAL AND SPORTS MEDICINE [address] [place], [address] Phone: [phone]   Fax:  [phone]  Physical Therapy Treatment  Patient Details  Name: Daniel Holden MRN: [id number] Date of Birth: [birth year] Referring Provider (PT): Hemang K. Sherryll Burger (Neurology)   Encounter Date: 06/30/2020   PT End of Session - 06/30/20 1300    Visit Number 6    Number of Visits 24    Date for PT Re-Evaluation 09/06/20    Authorization Type 6 of 10 Medicare reporting period from 06/14/2020    Progress Note Due on Visit 10    PT Start Time 1300    PT Stop Time 1348    PT Time Calculation (min) 48 min    Activity Tolerance Patient tolerated treatment well    Behavior During Therapy [facility] for tasks assessed/performed           Past Medical History:  Diagnosis Date  . Allergy   . Anxiety   . Hypertension   . Pancreatitis 2015  . Sleep apnea     Past Surgical History:  Procedure Laterality Date  . APPENDECTOMY     Patient states he did not have an appendectomy and does not know why this is in his chart. [facility] 06/28/2020  . KNEE ARTHROSCOPY WITH MEDIAL MENISECTOMY Left 06/25/2019   Procedure: KNEE ARTHROSCOPY WITH MEDIAL MENISECTOMY;  Surgeon: Juanell Fairly, MD;  Location: [facility] ORS;  Service: Orthopedics;  Laterality: Left;  . SHOULDER SURGERY    . TONSILLECTOMY      There were no vitals filed for this visit.   Subjective Assessment - 06/30/20 1301    Subjective Pain B UE anterior arms and chest pain. No pain currently. Pain increases when he moves his arms.    Pertinent History Patient is a 75 y.o. male who presents to outpatient physical therapy with a referral for medical diagnosis cervical radiculopathy. This patient's chief complaints consist of pain in bilateral UEs through the entire arm mostly provoked with walking more than 1.5 minutes or when he goes to bed leading to the following functional deficits: used to disturb sleep,  causes trouble with walking and when arms are swinging (he is a pretty active guy). Relevant past medical history and comorbidities include pancreatitis, sleep apnea, obesity, anxiety, R shoulder surgery, L knee arthroscopy, hypertension, alcohol abuse (improved over last 2 months or so to almost no alcohol), B 12 deficiency.   Patient denies hx of cancer, stroke, seizures, lung problem, major cardiac events, diabetes, unexplained weight loss, changes in bowel or bladder problems, new onset stumbling. Does drop things more often.    How long can you walk comfortably? 1.5 min    Diagnostic tests MRI shows  narrowning at C5-6, and C6-7.    Patient Stated Goals "to not have that pain up and down my arms"                                     PT Education - 06/30/20 2124    Education Details ther-ex, HEP    Person(s) Educated Patient    Methods Explanation;Demonstration;Tactile cues;Verbal cues;Handout    Comprehension Returned demonstration;Verbalized understanding          TREATMENT:  Medbridge Access Code [id number]   Manual therapy Seated STM R upper trap/rhomboid area to decrease tension    Therapeutic exercise:  Gait 2:24 seconds with reproduction of pectoralis  and B finger symptoms   Seated manually resisted scapular retraction targeting the lower trap muscle  L 10x5 seconds for 3 sets  Seated thoracic extension over chair 10x5 seconds for 4 sets with arms crossed   Pectoralis sensation during exercise  Decreased pectoral sensation with increased repetition suggesting stiffness  Gait x 4:45 seconds. B UE symptoms occurred around 2:40, chest symptoms occurred around 4:45 seconds  Seated thoracic extension over chair again, arms not crossed. 10x5 seconds  Decreased UE symptoms from gait   Improved exercise technique, movement at target joints, use of target muscles after mod verbal, visual, tactile cues.    Response to treatment Pt  tolerated session well without aggravation of symptoms.   Clinical impression Pt able to ambulate for about 2 minutes longer prior to onset of pectoralis symptoms following treatment to promote thoracic extension mobility. B UE and pectoralis symptoms seem to follow the T1/T2 dermatome pattern. Reproduction of pectoralis symptoms with thoracic extension over chair exercise which decreased with repetition suggesting stiffness. Pt tolerated session well without aggravation of symptoms. Pt will benefit from continued skilled physical therapy services to decreased pain, improve strength and function.          PT Short Term Goals - 06/28/20 1021      PT SHORT TERM GOAL #1   Title Be independent with initial home exercise program for self-management of symptoms.    Baseline Initial HEP to be provided at visit 2 as appropriate (06/14/2020);    Time 2    Period Weeks    Status Achieved    Target Date 06/28/20             PT Long Term Goals - 06/14/20 1721      PT LONG TERM GOAL #1   Title Be independent with a long-term home exercise program for self-management of symptoms.    Baseline Initial HEP to be provided at visit 2 as appropriate (06/14/2020);    Time 12    Period Weeks    Status New   TARGET DATE FOR ALL LONG TERM GOALS: 09/06/2020     PT LONG TERM GOAL #2   Title Patient will be able to ambulate for at least 30 minutes without onset of arm pain to improve his functional mobility and improve ability to complete his daily tasks and hobbies without hinderance.    Baseline 1.5 minutes to onset (06/14/2020):    Time 12    Period Weeks    Status New      PT LONG TERM GOAL #3   Title Demonstrate improved FOTO score by 10 units to demonstrate improvement in overall condition and self-reported functional ability.    Baseline to be tested at visit 2 (06/14/2020);    Time 12    Period Weeks    Status New      PT LONG TERM GOAL #4   Title Patient will report no difficulty  sleeping or going to bed due to concordant symptoms.    Baseline Used to awake him or prevent him from going to sleep (06/14/2020);    Time 12    Period Weeks    Status New                 Plan - 06/30/20 2125    Clinical Impression Statement Pt able to ambulate for about 2 minutes longer prior to onset of pectoralis symptoms following treatment to promote thoracic extension mobility. B UE and pectoralis symptoms seem to follow the  T1/T2 dermatome pattern. Reproduction of pectoralis symptoms with thoracic extension over chair exercise which decreased with repetition suggesting stiffness. Pt tolerated session well without aggravation of symptoms. Pt will benefit from continued skilled physical therapy services to decreased pain, improve strength and function.    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    Examination-Activity Limitations Locomotion Level;Sleep   general use of UEs   Examination-Participation Restrictions Church;Community Activity;Interpersonal Relationship    Stability/Clinical Decision Making Evolving/Moderate complexity    Rehab Potential Fair    PT Frequency 2x / week    PT Duration 12 weeks    PT Treatment/Interventions ADLs/Self Care Home Management;Moist Heat;Electrical Stimulation;Traction;Cryotherapy;Gait training;Therapeutic activities;Therapeutic exercise;Neuromuscular re-education;Patient/family education;Manual techniques;Dry needling;Passive range of motion;Spinal Manipulations;Joint Manipulations    PT Next Visit Plan assess FOTO (unsure what body part to use),  establish HEP when appropriate, consider manual therapy, further assess for source of symptoms (possibly isolating arm swing or not with and without various aerobic stress in various positions).    PT Home Exercise Plan [email or web address] (rows, gentle median nerve glides)    Consulted and Agree with Plan of Care Patient           Patient will benefit from skilled therapeutic intervention in order to improve the following deficits and impairments:  Pain,Improper body mechanics,Decreased mobility,Increased muscle spasms,Postural dysfunction,Decreased activity tolerance,Decreased endurance,Decreased range of motion,Impaired perceived functional ability,Impaired UE functional use,Hypomobility,Difficulty walking,Impaired flexibility,Obesity  Visit Diagnosis: Pain in right arm  Pain in left arm  Radiculopathy, cervical region     Problem List Patient Active Problem List   Diagnosis Date Noted  . OSA on CPAP 12/14/2019  . Neck ache 12/14/2019  . Anxiety 12/14/2019  . Class 1 obesity due to excess calories with serious comorbidity and body mass index (BMI) of 32.0 to 32.9 in adult 12/14/2019    Loralyn Freshwater PT, DPT   06/30/2020, 9:45 PM  [facility] PHYSICAL AND SPORTS MEDICINE [address], [place], [address] Phone: [phone]   Fax:  [phone]  Name: WILBUR OAKLAND MRN: [id number] Date of Birth: Nov 10, 1945

## 2020-06-30 NOTE — Patient Instructions (Signed)
Access Code: [email or web address] URL: [email or web address] Date: 06/30/2020 Prepared by: Loralyn Freshwater  Exercises Seated Thoracic Lumbar Extension - 2 x daily - 7 x weekly - 3 sets - 10 reps - 5 seconds hold Seated Scapular Retraction - 2 x daily - 7 x weekly - 3 sets - 10 reps - 5 hold

## 2020-07-04 ENCOUNTER — Other Ambulatory Visit: Payer: Self-pay

## 2020-07-04 ENCOUNTER — Ambulatory Visit: Payer: Medicare Other | Admitting: Physical Therapy

## 2020-07-04 ENCOUNTER — Telehealth: Payer: Self-pay | Admitting: Physical Therapy

## 2020-07-04 ENCOUNTER — Encounter: Payer: Self-pay | Admitting: Physical Therapy

## 2020-07-04 DIAGNOSIS — M79602 Pain in left arm: Secondary | ICD-10-CM

## 2020-07-04 DIAGNOSIS — M5412 Radiculopathy, cervical region: Secondary | ICD-10-CM | POA: Diagnosis not present

## 2020-07-04 DIAGNOSIS — M79601 Pain in right arm: Secondary | ICD-10-CM | POA: Diagnosis not present

## 2020-07-04 NOTE — Telephone Encounter (Signed)
Called referring doctor's office (Dr. Manuella Ghazi) to let them know patient has not gotten any better with PT so far and would benefit from further medical evaluation, hopefully sooner than his next scheduled follow up. Left message with Crystal, who said she would make sure Amber gets the note that I send over today.   Daniel Holden. Graylon Good, PT, DPT 07/04/20, 3:20 PM

## 2020-07-04 NOTE — Therapy (Signed)
[place] PHYSICAL AND SPORTS MEDICINE [address], [place], [address] Phone: [phone]   Fax:  [phone]  Physical Therapy Treatment  Patient Details  Name: Daniel Holden MRN: [id number] Date of Birth: [birth year] Referring Provider (PT): Hemang K. Manuella Ghazi (Neurology)   Encounter Date: 07/04/2020   PT End of Session - 07/04/20 1536    Visit Number 7    Number of Visits 24    Date for PT Re-Evaluation 09/06/20    Authorization Type 6 of 10 Medicare reporting period from 06/14/2020    Progress Note Due on Visit 10    PT Start Time 1120    PT Stop Time 1205    PT Time Calculation (min) 45 min    Activity Tolerance Patient tolerated treatment well;No increased pain    Behavior During Therapy WFL for tasks assessed/performed           Past Medical History:  Diagnosis Date  . Allergy   . Anxiety   . Hypertension   . Pancreatitis 2015  . Sleep apnea     Past Surgical History:  Procedure Laterality Date  . APPENDECTOMY     Patient states he did not have an appendectomy and does not know why this is in his chart. [facility] 06/28/2020  . KNEE ARTHROSCOPY WITH MEDIAL MENISECTOMY Left 06/25/2019   Procedure: KNEE ARTHROSCOPY WITH MEDIAL MENISECTOMY;  Surgeon: Thornton Park, MD;  Location: [facility] ORS;  Service: Orthopedics;  Laterality: Left;  . SHOULDER SURGERY    . TONSILLECTOMY      There were no vitals filed for this visit.   Subjective Assessment - 07/04/20 1122    Subjective Patient reports he did a lot of pectoral stretches and upper thoracic spine exercise last session. He felt fine when he left, but that night he had terrible symptoms like it was at the start. He had pain in his arms all Friday and actually still feels poorly today due to constant pain. He is having a lot of pain. He had a little bit of discomfort at his sternum/chest area during the session that did not seem concordant. He has 1/10 pain right now that has  been ther for 2-3 days at sternum and mostly in forearms. States the pain almost makes him feel nauseated.    Pertinent History Patient is a 75 y.o. male who presents to outpatient physical therapy with a referral for medical diagnosis cervical radiculopathy. This patient's chief complaints consist of pain in bilateral UEs through the entire arm mostly provoked with walking more than 1.5 minutes or when he goes to bed leading to the following functional deficits: used to disturb sleep, causes trouble with walking and when arms are swinging (he is a pretty active guy). Relevant past medical history and comorbidities include pancreatitis, sleep apnea, obesity, anxiety, R shoulder surgery, L knee arthroscopy, hypertension, alcohol abuse (improved over last 2 months or so to almost no alcohol), B 12 deficiency.   Patient denies hx of cancer, stroke, seizures, lung problem, major cardiac events, diabetes, unexplained weight loss, changes in bowel or bladder problems, new onset stumbling. Does drop things more often.    How long can you walk comfortably? 1.5 min    Diagnostic tests MRI shows  narrowning at C5-6, and C6-7.    Patient Stated Goals "to not have that pain up and down my arms"    Currently in Pain? Yes    Pain Score 1     Pain Location  Hand   bilateral hands   Pain Orientation Right;Left    Pain Type Chronic pain    Pain Onset More than a month ago           TREATMENT:   Manual therapy: to reduce pain and tissue tension, improve range of motion, neuromodulation, in order to promote improved ability to complete functional activities. - seated thoracic flexion with clinician OP x 2 to assess response (no change in symptoms) - prone CPA with KE wedge x 30 reps each segment, spending more time where patient reports he is a bit more tender (~T2))  Therapeutic exercise:toassess pt response to intervention and attempt to further isolate trigger for symptoms.  - open book x15 each side.  No change in symptoms.  - seated thoracic extension over back of chair with hands blocking cervical spine clasped behind head with clinician OP 2x10. No chest pain. Patient unsure if hands are better, but definitely not worse.  -seated AROM thoracic extension with hands clasped behind head lifting elbows towards ceiling, 2x10 - education on dermatomes with pictures and explanation.  - seated ulnar nerve floss x 10 left side - Education on HEP including handout     HOME EXERCISE PROGRAM  Access Code: [email or web address] URL: [email or web address] Date: 07/04/2020 Prepared by: Rosita Kea  Exercises Standing Shoulder Row with Anchored Resistance - 1 x daily - 7 x weekly - 3 sets - 10 reps Median Nerve Flossing - 1 x daily - 7 x weekly - 3 sets - 10 reps Ulnar Nerve Flossing - 2 x daily - 1 sets - 15 reps      PT Education - 07/04/20 1536    Education Details therapeutic exercise, interventions, POC, anatomy and physiology of condition and rationale    Person(s) Educated Patient    Methods Explanation;Demonstration;Tactile cues;Verbal cues    Comprehension Verbalized understanding;Returned demonstration;Verbal cues required;Tactile cues required;Need further instruction            PT Short Term Goals - 06/28/20 1021      PT SHORT TERM GOAL #1   Title Be independent with initial home exercise program for self-management of symptoms.    Baseline Initial HEP to be provided at visit 2 as appropriate (06/14/2020);    Time 2    Period Weeks    Status Achieved    Target Date 06/28/20             PT Long Term Goals - 07/04/20 1549      PT LONG TERM GOAL #1   Title Be independent with a long-term home exercise program for self-management of symptoms.    Baseline Initial HEP to be provided at visit 2 as appropriate (06/14/2020); participating (07/04/2020);    Time 12    Period Weeks    Status Partially Met   TARGET DATE FOR ALL LONG TERM GOALS: 09/06/2020     PT LONG TERM  GOAL #2   Title Patient will be able to ambulate for at least 30 minutes without onset of arm pain to improve his functional mobility and improve ability to complete his daily tasks and hobbies without hinderance.    Baseline 1.5 minutes to onset (06/14/2020):    Time 12    Period Weeks    Status On-going      PT LONG TERM GOAL #3   Title Demonstrate improved FOTO score by 10 units to demonstrate improvement in overall condition and self-reported functional ability.    Baseline to be tested at visit  2 (06/14/2020);    Time 12    Period Weeks    Status Unable to assess      PT LONG TERM GOAL #4   Title Patient will report no difficulty sleeping or going to bed due to concordant symptoms.    Baseline Used to awake him or prevent him from going to sleep (06/14/2020);    Time 12    Period Weeks    Status On-going                 Plan - 07/04/20 1547    Clinical Impression Statement Patient has now attended 6 physical therapy sessions and seen three different PTs who were unable to isolate a specific cause of his symptoms. His worst symptoms come on the night after PT and were provoked in this way after initial eval and after last treatment session, both where he had mild symptoms during session that resolved in clinic with rest. He does not have classic reproduction of symptoms with cervical spine testing, although CPA near C6 may have affected symptoms slightly, but it was not clear. He was also negative for thoracic outlet syndrome testing. Last session his thoracic spine was addressed more strongly with suspicion for T1 and T2 and his condition was exacerbated later. He also underwent some testing with walking vs Nustep with arms in various positions and chest symptoms seemed more related to exertion and arms more to position but also affected by exertion. Patient reports he has been cleared by cardiology including a stress test. Has not had any thoracic spine imagine to PT's knowledge.  Today's session continued focus of treatment on upper thoracic spine, but with slightly different interventions than last session, as well as some nerve flossing for ulnar nerve (patient already doing this for median nerve). Patient tolerated well in session and thought his hand symptoms may be a bit better. He does seem to indicate forearm symptoms are more in the medial aspect of his arms but does not have any symptoms in the axillary region or posterior shoulder region (T1/T2 dermatome). Will continue to attempt to address symptoms at future PT sessions, but recommend he attempt to return to referring physician earlier than planned for additional medical evaluation due to lack of response to PT and lack of clear PT diagnosis at this point. Please see previous PT notes for specific tests performed and and response. Will continue to monitor patient for improved response over the next few visits and plan to discontinue PT after visit 10 if no sign of improvement or otherwise appropriate or if advised by MD. Patient would benefit from continued management of limiting condition by skilled physical therapist to address remaining impairments and functional limitations to work towards stated goals and return to PLOF or maximal functional independence.    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    Examination-Activity Limitations Locomotion Level;Sleep   general use of UEs   Examination-Participation Restrictions Church;Community Activity;Interpersonal Relationship    Stability/Clinical Decision Making Evolving/Moderate  complexity    Rehab Potential Fair    PT Frequency 2x / week    PT Duration 12 weeks    PT Treatment/Interventions ADLs/Self Care Home Management;Moist Heat;Electrical Stimulation;Traction;Cryotherapy;Gait training;Therapeutic activities;Therapeutic exercise;Neuromuscular re-education;Patient/family education;Manual techniques;Dry  needling;Passive range of motion;Spinal Manipulations;Joint Manipulations    PT Next Visit Plan continue with nerve glides, stretching, exercises for symptom control    PT Home Exercise Plan [email or web address] (rows, gentle median nerve glides, thoracic extension AROM, ulnar nerve glides)    Recommended Other Services Return to referring clinician for further medical assessment    Consulted and Agree with Plan of Care Patient           Patient will benefit from skilled therapeutic intervention in order to improve the following deficits and impairments:  Pain,Improper body mechanics,Decreased mobility,Increased muscle spasms,Postural dysfunction,Decreased activity tolerance,Decreased endurance,Decreased range of motion,Impaired perceived functional ability,Impaired UE functional use,Hypomobility,Difficulty walking,Impaired flexibility,Obesity  Visit Diagnosis: Pain in right arm  Pain in left arm  Radiculopathy, cervical region     Problem List Patient Active Problem List   Diagnosis Date Noted  . OSA on CPAP 12/14/2019  . Neck ache 12/14/2019  . Anxiety 12/14/2019  . Class 1 obesity due to excess calories with serious comorbidity and body mass index (BMI) of 32.0 to 32.9 in adult 12/14/2019   Clarise Cruz R. Graylon Good, PT, DPT 07/04/20, 3:53 PM  [place] PHYSICAL AND SPORTS MEDICINE [address], [place], [address] Phone: [phone]   Fax:  [phone]  Name: Daniel Holden MRN: [id number] Date of Birth: 06-14-1946

## 2020-07-06 ENCOUNTER — Ambulatory Visit: Payer: Medicare Other | Admitting: Physical Therapy

## 2020-07-06 ENCOUNTER — Other Ambulatory Visit: Payer: Self-pay

## 2020-07-06 ENCOUNTER — Encounter: Payer: Self-pay | Admitting: Physical Therapy

## 2020-07-06 DIAGNOSIS — M79601 Pain in right arm: Secondary | ICD-10-CM

## 2020-07-06 DIAGNOSIS — M5412 Radiculopathy, cervical region: Secondary | ICD-10-CM | POA: Diagnosis not present

## 2020-07-06 DIAGNOSIS — M79602 Pain in left arm: Secondary | ICD-10-CM | POA: Diagnosis not present

## 2020-07-06 NOTE — Therapy (Signed)
[facility] [address], [place], [address] Phone: [phone]   Fax:  [phone]  Physical Therapy Treatment  Patient Details  Name: Daniel Holden MRN: [id number] Date of Birth: Oct 10, 1945 Referring Provider (PT): Hemang K. Manuella Ghazi (Neurology)   Encounter Date: 07/06/2020   PT End of Session - 07/06/20 1529    Visit Number 8    Number of Visits 24    Date for PT Re-Evaluation 09/06/20    Authorization Type Medicare reporting period from 06/14/2020    Progress Note Due on Visit 10    PT Start Time 1300    PT Stop Time 1340    PT Time Calculation (min) 40 min    Activity Tolerance Patient tolerated treatment well;No increased pain    Behavior During Therapy WFL for tasks assessed/performed           Past Medical History:  Diagnosis Date  . Allergy   . Anxiety   . Hypertension   . Pancreatitis 2015  . Sleep apnea     Past Surgical History:  Procedure Laterality Date  . APPENDECTOMY     Patient states he did not have an appendectomy and does not know why this is in his chart. [facility] 06/28/2020  . KNEE ARTHROSCOPY WITH MEDIAL MENISECTOMY Left 06/25/2019   Procedure: KNEE ARTHROSCOPY WITH MEDIAL MENISECTOMY;  Surgeon: Thornton Park, MD;  Location: [facility] ORS;  Service: Orthopedics;  Laterality: Left;  . SHOULDER SURGERY    . TONSILLECTOMY      There were no vitals filed for this visit.   Subjective Assessment - 07/06/20 1301    Subjective Patient reports he has constant tingling in his hands now. He continues to get symptoms with exertion. He has been doing thoracic extension AROM exercise and is unsure if it helps his pain but it is not making him worse. Slept okay following last treatment session. Got a call from the neuologist office that he is being referred to orthopedics with a an appointment next week. Also has been doing the ulnar nerve floss without problems.    Pertinent History Patient is  a 75 y.o. male who presents to outpatient physical therapy with a referral for medical diagnosis cervical radiculopathy. This patient's chief complaints consist of pain in bilateral UEs through the entire arm mostly provoked with walking more than 1.5 minutes or when he goes to bed leading to the following functional deficits: used to disturb sleep, causes trouble with walking and when arms are swinging (he is a pretty active guy). Relevant past medical history and comorbidities include pancreatitis, sleep apnea, obesity, anxiety, R shoulder surgery, L knee arthroscopy, hypertension, alcohol abuse (improved over last 2 months or so to almost no alcohol), B 12 deficiency.   Patient denies hx of cancer, stroke, seizures, lung problem, major cardiac events, diabetes, unexplained weight loss, changes in bowel or bladder problems, new onset stumbling. Does drop things more often.    How long can you walk comfortably? 1.5 min    Diagnostic tests MRI shows  narrowning at C5-6, and C6-7.    Patient Stated Goals "to not have that pain up and down my arms"    Currently in Pain? Yes    Pain Location Hand    Pain Onset More than a month ago           TREATMENT:   Manual therapy: to reduce pain and tissue tension, improve range of motion, neuromodulation, in order to  promote improved ability to complete functional activities. - prone CPA with KE wedge x 30 reps each segment, spending more time where patient reports he is a bit more tender (~T2))  Therapeutic exercise:toassess pt response to interventionand attempt to further isolate trigger for symptoms.  - seated thoracic extension over back of chair with hands blocking cervical spine clasped behind head with clinician OP 3x10. - seated cervical spine retraction x 10, with self overpressure x 10, with clinician overpressure 2x6 (complains of feeling choked) - prone press up with hands by head to target thoracic spine, x 10 - hooklying deep cervical  spine flexor nods 3x10 with self palpation.  - open book x15 each side. No change in symptoms.  - seated ulnar nerve floss x 10 left side - Education on HEP including handout     HOME EXERCISE PROGRAM  Access Code: [email or web address] URL: [email or web address] Date: 07/06/2020 Prepared by: Rosita Kea  Exercises Seated Thoracic Extension with Hands Behind Neck - 2 x daily - 2 sets - 10 reps - 2 seconds hold Median Nerve Flossing - 1 x daily - 7 x weekly - 3 sets - 10 reps Ulnar Nerve Flossing - 2 x daily - 1 sets - 15 reps Sidelying Thoracic Rotation with Open Book - 1 x daily - 1 sets - 20 reps Supine Deep Neck Flexor Nods - 1 x daily - 3 sets - 10 reps Standing Shoulder Row with Anchored Resistance - 1 x daily - 7 x weekly - 3 sets - 10 reps    PT Education - 07/06/20 1529    Education Details therapeutic exercise, interventions    Person(s) Educated Patient    Methods Explanation;Demonstration;Tactile cues;Verbal cues    Comprehension Verbalized understanding;Returned demonstration;Verbal cues required;Tactile cues required;Need further instruction            PT Short Term Goals - 06/28/20 1021      PT SHORT TERM GOAL #1   Title Be independent with initial home exercise program for self-management of symptoms.    Baseline Initial HEP to be provided at visit 2 as appropriate (06/14/2020);    Time 2    Period Weeks    Status Achieved    Target Date 06/28/20             PT Long Term Goals - 07/04/20 1549      PT LONG TERM GOAL #1   Title Be independent with a long-term home exercise program for self-management of symptoms.    Baseline Initial HEP to be provided at visit 2 as appropriate (06/14/2020); participating (07/04/2020);    Time 12    Period Weeks    Status Partially Met   TARGET DATE FOR ALL LONG TERM GOALS: 09/06/2020     PT LONG TERM GOAL #2   Title Patient will be able to ambulate for at least 30 minutes without onset of arm pain to improve his  functional mobility and improve ability to complete his daily tasks and hobbies without hinderance.    Baseline 1.5 minutes to onset (06/14/2020):    Time 12    Period Weeks    Status On-going      PT LONG TERM GOAL #3   Title Demonstrate improved FOTO score by 10 units to demonstrate improvement in overall condition and self-reported functional ability.    Baseline to be tested at visit 2 (06/14/2020);    Time 12    Period Weeks    Status Unable to assess  PT LONG TERM GOAL #4   Title Patient will report no difficulty sleeping or going to bed due to concordant symptoms.    Baseline Used to awake him or prevent him from going to sleep (06/14/2020);    Time 12    Period Weeks    Status On-going                 Plan - 07/06/20 1533    Clinical Impression Statement Patient tolerated treatment well and did not have any reproduction of symptoms throughout session. Did occasionally shake his hands but stated no symptoms. Patient continues to report thoracic extension feels good. Continued with interventions targeting cervical and thoracic spine and updated HEP in order of priority for daily practice.  Patient would benefit from continued management of limiting condition by skilled physical therapist to address remaining impairments and functional limitations to work towards stated goals and return to PLOF or maximal functional independence.    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    Examination-Activity Limitations Locomotion Level;Sleep   general use of UEs   Examination-Participation  Restrictions Church;Community Activity;Interpersonal Relationship    Stability/Clinical Decision Making Evolving/Moderate complexity    Rehab Potential Fair    PT Frequency 2x / week    PT Duration 12 weeks    PT Treatment/Interventions ADLs/Self Care Home Management;Moist Heat;Electrical Stimulation;Traction;Cryotherapy;Gait training;Therapeutic activities;Therapeutic exercise;Neuromuscular re-education;Patient/family education;Manual techniques;Dry needling;Passive range of motion;Spinal Manipulations;Joint Manipulations    PT Next Visit Plan continue with nerve glides, stretching, exercises for symptom control    PT Home Exercise Plan [email or web address] (rows, gentle median nerve glides, thoracic extension AROM, ulnar nerve glides)    Consulted and Agree with Plan of Care Patient           Patient will benefit from skilled therapeutic intervention in order to improve the following deficits and impairments:  Pain,Improper body mechanics,Decreased mobility,Increased muscle spasms,Postural dysfunction,Decreased activity tolerance,Decreased endurance,Decreased range of motion,Impaired perceived functional ability,Impaired UE functional use,Hypomobility,Difficulty walking,Impaired flexibility,Obesity  Visit Diagnosis: Pain in right arm  Pain in left arm  Radiculopathy, cervical region     Problem List Patient Active Problem List   Diagnosis Date Noted  . OSA on CPAP 12/14/2019  . Neck ache 12/14/2019  . Anxiety 12/14/2019  . Class 1 obesity due to excess calories with serious comorbidity and body mass index (BMI) of 32.0 to 32.9 in adult 12/14/2019    Clarise Cruz R. Graylon Good, PT, DPT 07/06/20, 3:33 PM  [facility] [address] [place], [address] Phone: [phone]   Fax:  [phone]  Name: WILMOT QUEVEDO MRN: [id number] Date of Birth: 04-18-1946

## 2020-07-11 ENCOUNTER — Ambulatory Visit: Payer: Medicare Other | Admitting: Physical Therapy

## 2020-07-12 ENCOUNTER — Other Ambulatory Visit: Payer: Self-pay | Admitting: *Deleted

## 2020-07-12 MED ORDER — BENAZEPRIL HCL 20 MG PO TABS: 20.0000 mg | ORAL_TABLET | Freq: Every day | ORAL | 3 refills | Status: DC

## 2020-07-12 MED ORDER — METOPROLOL TARTRATE 50 MG PO TABS: 50.0000 mg | ORAL_TABLET | Freq: Two times a day (BID) | ORAL | 3 refills | Status: DC

## 2020-07-13 ENCOUNTER — Other Ambulatory Visit (HOSPITAL_COMMUNITY): Payer: Self-pay | Admitting: Physical Medicine & Rehabilitation

## 2020-07-13 ENCOUNTER — Other Ambulatory Visit: Payer: Self-pay | Admitting: Physical Medicine & Rehabilitation

## 2020-07-13 ENCOUNTER — Ambulatory Visit: Payer: Medicare Other

## 2020-07-13 ENCOUNTER — Other Ambulatory Visit: Payer: Self-pay

## 2020-07-13 DIAGNOSIS — M5412 Radiculopathy, cervical region: Secondary | ICD-10-CM | POA: Diagnosis not present

## 2020-07-13 DIAGNOSIS — R0789 Other chest pain: Secondary | ICD-10-CM | POA: Diagnosis not present

## 2020-07-13 DIAGNOSIS — M79601 Pain in right arm: Secondary | ICD-10-CM

## 2020-07-13 DIAGNOSIS — M5414 Radiculopathy, thoracic region: Secondary | ICD-10-CM

## 2020-07-13 DIAGNOSIS — G8929 Other chronic pain: Secondary | ICD-10-CM | POA: Diagnosis not present

## 2020-07-13 DIAGNOSIS — M79602 Pain in left arm: Secondary | ICD-10-CM

## 2020-07-13 NOTE — Therapy (Unsigned)
[facility] [address], [place], [address] Phone: [phone]   Fax:  [phone]  Physical Therapy Treatment  Patient Details  Name: Daniel Holden MRN: [id number] Date of Birth: 15-Jan-1946 Referring Provider (PT): Hemang K. Manuella Ghazi (Neurology)   Encounter Date: 07/13/2020   PT End of Session - 07/13/20 1006    Visit Number 9    Number of Visits 24    Date for PT Re-Evaluation 09/06/20    Authorization Type Medicare reporting period from 06/14/2020    Progress Note Due on Visit 10    PT Start Time 0900    PT Stop Time 0945    PT Time Calculation (min) 45 min    Activity Tolerance Patient tolerated treatment well;No increased pain    Behavior During Therapy WFL for tasks assessed/performed           Past Medical History:  Diagnosis Date  . Allergy   . Anxiety   . Hypertension   . Pancreatitis 2015  . Sleep apnea     Past Surgical History:  Procedure Laterality Date  . APPENDECTOMY     Patient states he did not have an appendectomy and does not know why this is in his chart. [facility] 06/28/2020  . KNEE ARTHROSCOPY WITH MEDIAL MENISECTOMY Left 06/25/2019   Procedure: KNEE ARTHROSCOPY WITH MEDIAL MENISECTOMY;  Surgeon: Thornton Park, MD;  Location: [facility] ORS;  Service: Orthopedics;  Laterality: Left;  . SHOULDER SURGERY    . TONSILLECTOMY      There were no vitals filed for this visit.   Subjective Assessment - 07/13/20 1002    Subjective Patient states his pain is no better and no worse since the beginning of therapy. States he's been performing his exercises irregularly.    Pertinent History Patient is a 75 y.o. male who presents to outpatient physical therapy with a referral for medical diagnosis cervical radiculopathy. This patient's chief complaints consist of pain in bilateral UEs through the entire arm mostly provoked with walking more than 1.5 minutes or when he goes to bed leading to the  following functional deficits: used to disturb sleep, causes trouble with walking and when arms are swinging (he is a pretty active guy). Relevant past medical history and comorbidities include pancreatitis, sleep apnea, obesity, anxiety, R shoulder surgery, L knee arthroscopy, hypertension, alcohol abuse (improved over last 2 months or so to almost no alcohol), B 12 deficiency.   Patient denies hx of cancer, stroke, seizures, lung problem, major cardiac events, diabetes, unexplained weight loss, changes in bowel or bladder problems, new onset stumbling. Does drop things more often.    How long can you walk comfortably? 1.5 min    Diagnostic tests MRI shows  narrowning at C5-6, and C6-7.    Patient Stated Goals "to not have that pain up and down my arms"    Currently in Pain? Yes    Pain Score 1     Pain Location Hand    Pain Orientation Right;Left    Pain Descriptors / Indicators Aching    Pain Type Chronic pain    Pain Onset More than a month ago    Pain Frequency Intermittent           TREATMENT Therapeutic Exercise Thoracic extension in chair with towel, hands behind neck -- x 15 with elbows in; x 15 with elbows out Thoracic extension in supine with foam roller -- x1.5 min Doorway pec minor stretch, arms overhead --  x15 - 10sec arms at 90, @ 60 degree ULTT1 in supine to decrease pain -- x15 with 10 sec Ulnar nerve tension in spine to decrease pain -- x 15 10 sec holds Scapular depression in standing -- x 15  Performed exercises to decrease increased spasms and pain       PT Education - 07/13/20 1005    Education Details form/technique with exercise: pec minor stretch in doorway    Person(s) Educated Patient    Methods Explanation;Demonstration    Comprehension Verbalized understanding;Returned demonstration            PT Short Term Goals - 06/28/20 1021      PT SHORT TERM GOAL #1   Title Be independent with initial home exercise program for self-management of symptoms.     Baseline Initial HEP to be provided at visit 2 as appropriate (06/14/2020);    Time 2    Period Weeks    Status Achieved    Target Date 06/28/20             PT Long Term Goals - 07/04/20 1549      PT LONG TERM GOAL #1   Title Be independent with a long-term home exercise program for self-management of symptoms.    Baseline Initial HEP to be provided at visit 2 as appropriate (06/14/2020); participating (07/04/2020);    Time 12    Period Weeks    Status Partially Met   TARGET DATE FOR ALL LONG TERM GOALS: 09/06/2020     PT LONG TERM GOAL #2   Title Patient will be able to ambulate for at least 30 minutes without onset of arm pain to improve his functional mobility and improve ability to complete his daily tasks and hobbies without hinderance.    Baseline 1.5 minutes to onset (06/14/2020):    Time 12    Period Weeks    Status On-going      PT LONG TERM GOAL #3   Title Demonstrate improved FOTO score by 10 units to demonstrate improvement in overall condition and self-reported functional ability.    Baseline to be tested at visit 2 (06/14/2020);    Time 12    Period Weeks    Status Unable to assess      PT LONG TERM GOAL #4   Title Patient will report no difficulty sleeping or going to bed due to concordant symptoms.    Baseline Used to awake him or prevent him from going to sleep (06/14/2020);    Time 12    Period Weeks    Status On-going                 Plan - 07/13/20 1007    Clinical Impression Statement Onset of symptoms with performance pec minor stretch at doorway, however symptoms are localzied to the UE versus the chest. TTP along pec minor on palpation as well as 1st rib mobilization, however pain is not concordant. Focused greater amount of effort onto L side today to test change in symptoms with exercise performance. Patient will benefit from further skilled therapy to return to prior level of function.    Personal Factors and Comorbidities Age;Behavior  Pattern;Comorbidity 3+;Past/Current Experience;Profession;Social Background;Fitness;Time since onset of injury/illness/exacerbation    Comorbidities Relevant past medical history and comorbidities include pancreatitis, sleep apnea, obesity, anxiety, R shoulder surgery, L knee arthroscopy, hypertension, alcohol abuse (improved over last 2 months or so to almost no alcohol), B 12 deficiency.    Examination-Activity Limitations Locomotion Level;Sleep  general use of UEs   Examination-Participation Restrictions Church;Community Activity;Interpersonal Relationship    Stability/Clinical Decision Making Evolving/Moderate complexity    Rehab Potential Fair    PT Frequency 2x / week    PT Duration 12 weeks    PT Treatment/Interventions ADLs/Self Care Home Management;Moist Heat;Electrical Stimulation;Traction;Cryotherapy;Gait training;Therapeutic activities;Therapeutic exercise;Neuromuscular re-education;Patient/family education;Manual techniques;Dry needling;Passive range of motion;Spinal Manipulations;Joint Manipulations    PT Next Visit Plan continue with nerve glides, stretching, exercises for symptom control    PT Home Exercise Plan [email or web address] (rows, gentle median nerve glides, thoracic extension AROM, ulnar nerve glides)    Consulted and Agree with Plan of Care Patient           Patient will benefit from skilled therapeutic intervention in order to improve the following deficits and impairments:  Pain,Improper body mechanics,Decreased mobility,Increased muscle spasms,Postural dysfunction,Decreased activity tolerance,Decreased endurance,Decreased range of motion,Impaired perceived functional ability,Impaired UE functional use,Hypomobility,Difficulty walking,Impaired flexibility,Obesity  Visit Diagnosis: Pain in right arm  Pain in left arm  Radiculopathy, cervical region     Problem List Patient Active Problem List   Diagnosis Date Noted  . OSA on CPAP 12/14/2019  . Neck ache 12/14/2019  .  Anxiety 12/14/2019  . Class 1 obesity due to excess calories with serious comorbidity and body mass index (BMI) of 32.0 to 32.9 in adult 12/14/2019    Blythe Stanford, PT DPT 07/13/2020, 1:41 PM  [facility] [address] [place], [address] Phone: [phone]   Fax:  [phone]  Name: Daniel Holden MRN: [id number] Date of Birth: 1945/12/16

## 2020-07-14 ENCOUNTER — Ambulatory Visit (INDEPENDENT_AMBULATORY_CARE_PROVIDER_SITE_OTHER): Payer: Medicare Other | Admitting: Family Medicine

## 2020-07-14 DIAGNOSIS — G629 Polyneuropathy, unspecified: Secondary | ICD-10-CM

## 2020-07-14 DIAGNOSIS — E6609 Other obesity due to excess calories: Secondary | ICD-10-CM | POA: Diagnosis not present

## 2020-07-14 DIAGNOSIS — Z6832 Body mass index (BMI) 32.0-32.9, adult: Secondary | ICD-10-CM

## 2020-07-14 DIAGNOSIS — F419 Anxiety disorder, unspecified: Secondary | ICD-10-CM | POA: Diagnosis not present

## 2020-07-14 DIAGNOSIS — I10 Essential (primary) hypertension: Secondary | ICD-10-CM | POA: Diagnosis not present

## 2020-07-14 DIAGNOSIS — Z0000 Encounter for general adult medical examination without abnormal findings: Secondary | ICD-10-CM

## 2020-07-14 MED ORDER — ALPRAZOLAM 0.5 MG PO TABS: 0.2500 mg | ORAL_TABLET | Freq: Two times a day (BID) | ORAL | 0 refills | Status: AC | PRN

## 2020-07-14 NOTE — Assessment & Plan Note (Addendum)
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

## 2020-07-14 NOTE — Progress Notes (Signed)
Established Patient Office Visit  SUBJECTIVE:  Subjective  Patient ID: Daniel Holden, male    DOB: [birth year]  Age: 75 y.o. MRN: [id number]  CC:  Chief Complaint  Patient presents with  . Anxiety    Patient needs refill of xanax   . Annual Exam    HPI Daniel Holden is a 75 y.o. male presenting today for     Past Medical History:  Diagnosis Date  . Allergy   . Anxiety   . Hypertension   . Pancreatitis 2015  . Sleep apnea     Past Surgical History:  Procedure Laterality Date  . APPENDECTOMY     Patient states he did not have an appendectomy and does not know why this is in his chart. [facility] 06/28/2020  . KNEE ARTHROSCOPY WITH MEDIAL MENISECTOMY Left 06/25/2019   Procedure: KNEE ARTHROSCOPY WITH MEDIAL MENISECTOMY;  Surgeon: Thornton Park, MD;  Location: [facility] ORS;  Service: Orthopedics;  Laterality: Left;  . SHOULDER SURGERY    . TONSILLECTOMY      History reviewed. No pertinent family history.  Social History   Socioeconomic History  . Marital status: Married    Spouse name: Not on file  . Number of children: Not on file  . Years of education: Not on file  . Highest education level: Not on file  Occupational History  . Not on file  Tobacco Use  . Smoking status: Never Smoker  . Smokeless tobacco: Never Used  Vaping Use  . Vaping Use: Never used  Substance and Sexual Activity  . Alcohol use: Yes    Alcohol/week: 2.0 standard drinks    Types: 2 Cans of beer per week    Comment: 6 per day  . Drug use: No  . Sexual activity: Not on file  Other Topics Concern  . Not on file  Social History Narrative  . Not on file   Social Determinants of Health   Financial Resource Strain: Not on file  Food Insecurity: Not on file  Transportation Needs: Not on file  Physical Activity: Not on file  Stress: Not on file  Social Connections: Not on file  Intimate Partner Violence: Not on file     Current Outpatient Medications:  .  benazepril (LOTENSIN) 20  MG tablet, Take 1 tablet (20 mg total) by mouth daily., Disp: 90 tablet, Rfl: 3 .  fluticasone (FLONASE) 50 MCG/ACT nasal spray, Place 2 sprays into both nostrils daily. , Disp: , Rfl:  .  gabapentin (NEURONTIN) 300 MG capsule, Take 300 mg by mouth 2 (two) times daily., Disp: , Rfl:  .  metoprolol tartrate (LOPRESSOR) 50 MG tablet, Take 1 tablet (50 mg total) by mouth 2 (two) times daily., Disp: 180 tablet, Rfl: 3 .  naproxen sodium (ALEVE) 220 MG tablet, Take 220-440 mg by mouth 2 (two) times daily as needed (pain). , Disp: , Rfl:  .  sildenafil (VIAGRA) 50 MG tablet, Take 100 mg by mouth daily as needed for erectile dysfunction. , Disp: , Rfl:  .  ALPRAZolam (XANAX) 0.5 MG tablet, Take 0.5 tablets (0.25 mg total) by mouth 2 (two) times daily as needed for sleep., Disp: 60 tablet, Rfl: 0 .  aspirin EC 325 MG tablet, Take 1 tablet (325 mg total) by mouth daily. (Patient not taking: Reported on 06/14/2020), Disp: 45 tablet, Rfl: 0 .  b complex vitamins tablet, Take 1 tablet by mouth daily. (Patient not taking: Reported on 06/14/2020), Disp: , Rfl:  .  diphenhydrAMINE (  BENADRYL) 25 MG tablet, Take 25 mg by mouth at bedtime as needed for sleep.  (Patient not taking: Reported on 06/14/2020), Disp: , Rfl:    Allergies  Allergen Reactions  . Dilaudid [Hydromorphone Hcl] Nausea And Vomiting    ROS Review of Systems  Constitutional: Negative.   HENT: Negative.   Respiratory: Negative.   Cardiovascular: Negative.   Musculoskeletal: Negative.   Skin: Negative.   Neurological: Positive for numbness.  Psychiatric/Behavioral: Negative.      OBJECTIVE:    Physical Exam Vitals and nursing note reviewed.  Constitutional:      Appearance: He is obese.  HENT:     Head: Normocephalic and atraumatic.     Nose: Nose normal.  Cardiovascular:     Rate and Rhythm: Normal rate and regular rhythm.  Pulmonary:     Effort: Pulmonary effort is normal.  Abdominal:     Palpations: Abdomen is soft.   Musculoskeletal:        General: Normal range of motion.     Cervical back: Normal range of motion.  Skin:    General: Skin is warm.     Capillary Refill: Capillary refill takes less than 2 seconds.  Neurological:     Mental Status: He is alert.  Psychiatric:        Mood and Affect: Mood normal.     There were no vitals taken for this visit. Wt Readings from Last 3 Encounters:  12/14/19 212 lb 12.8 oz (96.5 kg)  06/25/19 205 lb 0.4 oz (93 kg)  06/15/19 205 lb (93 kg)    Health Maintenance Due  Topic Date Due  . Hepatitis C Screening  Never done  . TETANUS/TDAP  Never done  . COLONOSCOPY (Pts 45-65yrs Insurance coverage will need to be confirmed)  Never done  . PNA vac Low Risk Adult (1 of 2 - PCV13) Never done    There are no preventive care reminders to display for this patient.  CBC Latest Ref Rng & Units 06/22/2019 04/22/2017 06/09/2016  WBC 4.0 - 10.5 K/uL 6.4 7.3 9.9  Hemoglobin 13.0 - 17.0 g/dL 13.0 14.6 13.9  Hematocrit 39.0 - 52.0 % 35.5(L) 41.5 38.4(L)  Platelets 150 - 400 K/uL 208 212 174   CMP Latest Ref Rng & Units 06/22/2019 04/22/2017 06/09/2016  Glucose 70 - 99 mg/dL 108(H) 175(H) 166(H)  BUN 8 - 23 mg/dL 15 10 18   Creatinine 0.61 - 1.24 mg/dL 0.56(L) 0.97 0.92  Sodium 135 - 145 mmol/L 132(L) 129(L) 132(L)  Potassium 3.5 - 5.1 mmol/L 4.0 3.0(L) 3.1(L)  Chloride 98 - 111 mmol/L 99 90(L) 97(L)  CO2 22 - 32 mmol/L 22 28 23   Calcium 8.9 - 10.3 mg/dL 9.1 9.4 8.9  Total Protein 6.5 - 8.1 g/dL - 8.6(H) 7.9  Total Bilirubin 0.3 - 1.2 mg/dL - 1.4(H) 1.5(H)  Alkaline Phos 38 - 126 U/L - 50 41  AST 15 - 41 U/L - 36 38  ALT 17 - 63 U/L - 20 12(L)    No results found for: TSH Lab Results  Component Value Date   ALBUMIN 4.7 04/22/2017   ANIONGAP 11 06/22/2019   Lab Results  Component Value Date   CHOL 83 09/27/2013   HDL 28 (L) 09/27/2013   LDLCALC 17 09/27/2013   Lab Results  Component Value Date   TRIG 188 09/27/2013   No results found for:  HGBA1C    ASSESSMENT & PLAN:   Problem List Items Addressed This Visit  Cardiovascular and Mediastinum   Primary hypertension     Nervous and Auditory   Neuropathy    Patient has had peripheral Neuropathy for 6 months, he is currently under the care of a Physiatrist and PT with possible carpal tunnel with brachial plexus being the culprit. Pain managed currently.         Other   Anxiety    Sleeping well, no anxiety attacks.       Relevant Medications   ALPRAZolam (XANAX) 0.5 MG tablet   Class 1 obesity due to excess calories with serious comorbidity and body mass index (BMI) of 32.0 to 32.9 in adult    - Pt is morbid obesity I = 30-34.9 - activation or motivation to change monitored - encouragement and support provided       Annual physical exam - Primary    Colon Screening- 2015 PSA- Unknown will draw today TDAP- Unknown - Declined today Shingles Vaccine- Done Flu Vaccine- 2022 Pneumonia Vaccine- No  Patient doing well taking all meds, he is dealing with a peripheral neuropathy issue left arm that is being worked up and PT is being done.          Meds ordered this encounter  Medications  . ALPRAZolam (XANAX) 0.5 MG tablet    Sig: Take 0.5 tablets (0.25 mg total) by mouth 2 (two) times daily as needed for sleep.    Dispense:  60 tablet    Refill:  0      Follow-up: No follow-ups on file.    Beckie Salts, [place] [address], [place], [place] [address]

## 2020-07-14 NOTE — Assessment & Plan Note (Signed)
Sleeping well, no anxiety attacks.

## 2020-07-15 ENCOUNTER — Encounter: Payer: Self-pay | Admitting: Family Medicine

## 2020-07-15 NOTE — Assessment & Plan Note (Addendum)
Patient has had peripheral Neuropathy for 6 months, he is currently under the care of a Physiatrist and PT with possible carpal tunnel with brachial plexus being the culprit. Pain managed currently.

## 2020-07-15 NOTE — Assessment & Plan Note (Signed)
-   Pt is morbid obesity I = 30-34.9 - activation or motivation to change monitored - encouragement and support provided

## 2020-07-18 ENCOUNTER — Ambulatory Visit: Payer: Medicare Other | Admitting: Physical Therapy

## 2020-07-20 ENCOUNTER — Ambulatory Visit: Payer: Medicare Other | Admitting: Physical Therapy

## 2020-07-20 ENCOUNTER — Encounter: Payer: Medicare Other | Admitting: Physical Therapy

## 2020-07-22 ENCOUNTER — Ambulatory Visit
Admission: RE | Admit: 2020-07-22 | Discharge: 2020-07-22 | Disposition: A | Discharge status: Home / Self Care | Payer: Medicare Other | Source: Ambulatory Visit | Attending: Physical Medicine & Rehabilitation | Admitting: Physical Medicine & Rehabilitation

## 2020-07-22 ENCOUNTER — Other Ambulatory Visit: Payer: Self-pay

## 2020-07-22 DIAGNOSIS — M5414 Radiculopathy, thoracic region: Secondary | ICD-10-CM | POA: Diagnosis not present

## 2020-07-22 DIAGNOSIS — M5114 Intervertebral disc disorders with radiculopathy, thoracic region: Secondary | ICD-10-CM | POA: Diagnosis not present

## 2020-07-25 ENCOUNTER — Telehealth: Payer: Self-pay | Admitting: Physical Therapy

## 2020-07-25 ENCOUNTER — Encounter: Payer: Self-pay | Admitting: Physical Therapy

## 2020-07-25 ENCOUNTER — Ambulatory Visit (INDEPENDENT_AMBULATORY_CARE_PROVIDER_SITE_OTHER): Payer: Medicare Other | Admitting: Orthopaedic Surgery

## 2020-07-25 ENCOUNTER — Encounter: Payer: Self-pay | Admitting: Orthopaedic Surgery

## 2020-07-25 DIAGNOSIS — M79601 Pain in right arm: Secondary | ICD-10-CM

## 2020-07-25 DIAGNOSIS — M79602 Pain in left arm: Secondary | ICD-10-CM

## 2020-07-25 DIAGNOSIS — M5412 Radiculopathy, cervical region: Secondary | ICD-10-CM

## 2020-07-25 DIAGNOSIS — G5601 Carpal tunnel syndrome, right upper limb: Secondary | ICD-10-CM | POA: Diagnosis not present

## 2020-07-25 NOTE — Telephone Encounter (Signed)
Called patient back at his request for update on his condition. He reports he saw the physiatrist who was also unsure what was causing his chest pain but thought CTS could be causing pain up the arm. He just had a thoracic MRI to rule out spinal sources of pain and is planning to do carpal tunnel surgery and go from there. States at his last PT visit, the PT did some manual therapy with his hands at his pectoralis muscle that caused a bruise but he has seem to have less symptoms following that. But he also has not done a lot being snowed in. Physiatrist thought maybe the chest pain may have a separate visceral cause more related to exertion than the arm pain. Agreed to discharge from PT at this time. He will let us know if he needs to return in the future.    Daniel Holden. Graylon Good, PT, DPT 07/25/20, 9:24 AM

## 2020-07-25 NOTE — Progress Notes (Signed)
Office Visit Note   Patient: Daniel Holden           Date of Birth: 1945/07/23           MRN: [id number] Visit Date: 07/25/2020              Requested by: Cletis Athens, MD [address] [place],  [place] [address] PCP: Cletis Athens, MD   Assessment & Plan: Visit Diagnoses:  1. Carpal tunnel syndrome, right upper limb     Plan: I do feel that some of the radicular symptoms radiating up from the wrist into the forearm area could be related to his carpal tunnel syndrome.  Given the fact that he is quite severe we are still recommending an open carpal tunnel release.  I described this to him in detail in terms of the anatomy of the wrist.  We talked about the risk and benefits of surgery as well and he does wish to proceed.  He is an active 75 year old gentleman and he is right-hand dominant.  We will work on getting this scheduled and see him back in 2 weeks postoperative.  At that visit I would like to actually have 3 views of his right shoulder.  He said the shoulder starting to sublux.  He had a history at a young age of a dislocation of that shoulder that required labral repair.  He is now having symptoms of the shoulder slightly sliding out of place.  So again at the next visit other than having a postoperative follow-up for suture removal will have 3 views of the right shoulder.  All questions and concerns were answered and addressed.  Follow-Up Instructions: Return for 2 weeks post-op.   Orders:  No orders of the defined types were placed in this encounter.  No orders of the defined types were placed in this encounter.     Procedures: No procedures performed   Clinical Data: No additional findings.   Subjective: Chief Complaint  Patient presents with  . Right Hand - Pain, Numbness  The patient comes in today for evaluation treatment of severe carpal tunnel syndrome of his right upper extremity that has been well-documented with nerve conduction studies.  Those  studies also show moderate carpal tunnel syndrome of the left upper extremity.  He has interesting symptoms in terms of really not a lot in his hands and no weakness.  There is a slight atrophy he states on the right dominant side.  It does radiate up into the arms on both sides.  He has had his neck evaluated as well there is some slight nerve compression of his neck but the nerve studies did not show any evidence of nerve compression that would affect his upper extremities.  He is not a diabetic either.  He is seeing a neurologist as well.  HPI  Review of Systems He currently denies any headache, chest pain, shortness of breath, fever, chills, nausea, vomiting  Objective: Vital Signs: There were no vitals taken for this visit.  Physical Exam He is alert and orient x3 and in no acute distress Ortho Exam Examination of both hands shows no muscle atrophy at all.  He has excellent grip and pinch strength.  There is maybe slight atrophy on the right hand. Specialty Comments:  No specialty comments available.  Imaging: No results found. The nerve studies are reviewed in detail.  They do show severe carpal tunnel syndrome on the right side and moderate on the left side.  PMFS History: Patient Active Problem List   Diagnosis Date Noted  . Carpal tunnel syndrome, right upper limb 07/25/2020  . Annual physical exam 07/14/2020  . Neuropathy 07/14/2020  . Primary hypertension 07/14/2020  . OSA on CPAP 12/14/2019  . Neck ache 12/14/2019  . Anxiety 12/14/2019  . Class 1 obesity due to excess calories with serious comorbidity and body mass index (BMI) of 32.0 to 32.9 in adult 12/14/2019   Past Medical History:  Diagnosis Date  . Allergy   . Anxiety   . Hypertension   . Pancreatitis 2015  . Sleep apnea     History reviewed. No pertinent family history.  Past Surgical History:  Procedure Laterality Date  . APPENDECTOMY     Patient states he did not have an appendectomy and does not know  why this is in his chart. [facility] 06/28/2020  . KNEE ARTHROSCOPY WITH MEDIAL MENISECTOMY Left 06/25/2019   Procedure: KNEE ARTHROSCOPY WITH MEDIAL MENISECTOMY;  Surgeon: Thornton Park, MD;  Location: [facility] ORS;  Service: Orthopedics;  Laterality: Left;  . SHOULDER SURGERY    . TONSILLECTOMY     Social History   Occupational History  . Not on file  Tobacco Use  . Smoking status: Never Smoker  . Smokeless tobacco: Never Used  Vaping Use  . Vaping Use: Never used  Substance and Sexual Activity  . Alcohol use: Yes    Alcohol/week: 2.0 standard drinks    Types: 2 Cans of beer per week    Comment: 6 per day  . Drug use: No  . Sexual activity: Not on file

## 2020-07-25 NOTE — Therapy (Signed)
[facility] [address] [place], [address] Phone: [phone]   Fax:  [phone]  Physical Therapy No-Visit Discharge Summary Reporting period: 06/14/2020 - 07/25/2020  Patient Details  Name: Daniel Holden MRN: [id number] Date of Birth: 08-01-1945 Referring Provider (PT): Hemang K. Manuella Ghazi (Neurology)   Encounter Date: 07/25/2020    Past Medical History:  Diagnosis Date  . Allergy   . Anxiety   . Hypertension   . Pancreatitis 2015  . Sleep apnea     Past Surgical History:  Procedure Laterality Date  . APPENDECTOMY     Patient states he did not have an appendectomy and does not know why this is in his chart. [facility] 06/28/2020  . KNEE ARTHROSCOPY WITH MEDIAL MENISECTOMY Left 06/25/2019   Procedure: KNEE ARTHROSCOPY WITH MEDIAL MENISECTOMY;  Surgeon: Thornton Park, MD;  Location: [facility] ORS;  Service: Orthopedics;  Laterality: Left;  . SHOULDER SURGERY    . TONSILLECTOMY      There were no vitals filed for this visit.   Subjective Assessment - 07/25/20 0925    Subjective Called patient back at his request for update on his condition. He reports he saw the physiatrist who was also unsure what was causing his chest pain but thought CTS could be causing pain up the arm. He just had a thoracic MRI to rule out spinal sources of pain and is planning to do carpal tunnel surgery and go from there. States at his last PT visit, the PT did some manual therapy with his hands at his pectoralis muscle that caused a bruise but he has seem to have less symptoms following that. But he also has not done a lot being snowed in. Physiatrist thought maybe the chest pain may have a separate visceral cause more related to exertion than the arm pain. Agreed to discharge from PT at this time. He will let us know if he needs to return in the future.    Pertinent History Patient is a 75 y.o. male who presents to outpatient physical  therapy with a referral for medical diagnosis cervical radiculopathy. This patient's chief complaints consist of pain in bilateral UEs through the entire arm mostly provoked with walking more than 1.5 minutes or when he goes to bed leading to the following functional deficits: used to disturb sleep, causes trouble with walking and when arms are swinging (he is a pretty active guy). Relevant past medical history and comorbidities include pancreatitis, sleep apnea, obesity, anxiety, R shoulder surgery, L knee arthroscopy, hypertension, alcohol abuse (improved over last 2 months or so to almost no alcohol), B 12 deficiency.   Patient denies hx of cancer, stroke, seizures, lung problem, major cardiac events, diabetes, unexplained weight loss, changes in bowel or bladder problems, new onset stumbling. Does drop things more often.    How long can you walk comfortably? 1.5 min    Diagnostic tests MRI shows  narrowning at C5-6, and C6-7.    Patient Stated Goals "to not have that pain up and down my arms"    Pain Onset More than a month ago           OBJECTIVE Patient is not present for examination at this time. Please see previous documentation for latest objective data.     PT Short Term Goals - 06/28/20 1021      PT SHORT TERM GOAL #1   Title Be independent with initial home exercise program for self-management of symptoms.  Baseline Initial HEP to be provided at visit 2 as appropriate (06/14/2020);    Time 2    Period Weeks    Status Achieved    Target Date 06/28/20             PT Long Term Goals - 07/25/20 0926      PT LONG TERM GOAL #1   Title Be independent with a long-term home exercise program for self-management of symptoms.    Baseline Initial HEP to be provided at visit 2 as appropriate (06/14/2020); participating (07/04/2020);    Time 12    Period Weeks    Status Achieved   TARGET DATE FOR ALL LONG TERM GOALS: 09/06/2020     PT LONG TERM GOAL #2   Title Patient will be able  to ambulate for at least 30 minutes without onset of arm pain to improve his functional mobility and improve ability to complete his daily tasks and hobbies without hinderance.    Baseline 1.5 minutes to onset (06/14/2020):    Time 12    Period Weeks    Status Not Met      PT LONG TERM GOAL #3   Title Demonstrate improved FOTO score by 10 units to demonstrate improvement in overall condition and self-reported functional ability.    Baseline to be tested at visit 2 (06/14/2020); never measured due to lack of matched body part (07/25/2020);    Time 12    Period Weeks    Status Not Met      PT LONG TERM GOAL #4   Title Patient will report no difficulty sleeping or going to bed due to concordant symptoms.    Baseline Used to awake him or prevent him from going to sleep (06/14/2020);    Time 12    Period Weeks    Status Not Met                 Plan - 07/25/20 0929    Clinical Impression Statement Patient returned to referring clinician after not achieving any significant improvement in 9 PT sessions. He has now been referred to physiatrist and is pursuing carpal tunnel surgery. He is now discharged from PT due to lack of progress and need for further medical evaluation/care.    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    Examination-Activity Limitations Locomotion Level;Sleep   general use of UEs   Examination-Participation Restrictions Church;Community Activity;Interpersonal Relationship    Stability/Clinical Decision Making Evolving/Moderate complexity    Rehab Potential Fair    PT Frequency  2x / week    PT Duration 12 weeks    PT Treatment/Interventions ADLs/Self Care Home Management;Moist Heat;Electrical Stimulation;Traction;Cryotherapy;Gait training;Therapeutic activities;Therapeutic exercise;Neuromuscular re-education;Patient/family education;Manual techniques;Dry needling;Passive range of motion;Spinal Manipulations;Joint Manipulations    PT Next Visit Plan Patient is now discharged from [email or web address] [id number] (rows, gentle median nerve glides, thoracic extension AROM, ulnar nerve glides)    Recommended Other Services Return to referring clinician for further medical assessment    Consulted and Agree with Plan of Care Patient           Patient will benefit from skilled therapeutic intervention in order to improve the following deficits and impairments:  Pain,Improper body mechanics,Decreased mobility,Increased muscle spasms,Postural dysfunction,Decreased activity tolerance,Decreased endurance,Decreased range of motion,Impaired perceived functional ability,Impaired UE functional use,Hypomobility,Difficulty walking,Impaired flexibility,Obesity  Visit Diagnosis: Pain in right arm  Pain in left arm  Radiculopathy, cervical region     Problem List Patient Active Problem List   Diagnosis Date Noted  . Annual physical exam 07/14/2020  . Neuropathy 07/14/2020  . Primary hypertension 07/14/2020  . OSA on CPAP 12/14/2019  . Neck ache 12/14/2019  . Anxiety 12/14/2019  . Class 1 obesity due to excess calories with serious comorbidity and body mass index (BMI) of 32.0 to 32.9 in adult 12/14/2019    Clarise Cruz R. Graylon Good, PT, DPT 07/25/20, 9:30 AM  [facility] [address], [place], [address] Phone: [phone]   Fax:  [phone]  Name: DONOVEN PETT MRN: [id number] Date of Birth: [birth year]

## 2020-07-26 ENCOUNTER — Other Ambulatory Visit: Payer: Self-pay | Admitting: Physical Medicine & Rehabilitation

## 2020-07-26 DIAGNOSIS — R918 Other nonspecific abnormal finding of lung field: Secondary | ICD-10-CM | POA: Diagnosis not present

## 2020-07-26 DIAGNOSIS — M5412 Radiculopathy, cervical region: Secondary | ICD-10-CM | POA: Diagnosis not present

## 2020-07-26 DIAGNOSIS — R0789 Other chest pain: Secondary | ICD-10-CM | POA: Diagnosis not present

## 2020-07-26 DIAGNOSIS — G8929 Other chronic pain: Secondary | ICD-10-CM | POA: Diagnosis not present

## 2020-07-26 DIAGNOSIS — M5414 Radiculopathy, thoracic region: Secondary | ICD-10-CM | POA: Diagnosis not present

## 2020-08-04 ENCOUNTER — Other Ambulatory Visit: Payer: Self-pay | Admitting: Orthopaedic Surgery

## 2020-08-04 DIAGNOSIS — G5601 Carpal tunnel syndrome, right upper limb: Secondary | ICD-10-CM | POA: Diagnosis not present

## 2020-08-04 MED ORDER — HYDROCODONE-ACETAMINOPHEN 5-325 MG PO TABS: 1.0000 | ORAL_TABLET | Freq: Four times a day (QID) | ORAL | 0 refills | Status: DC | PRN

## 2020-08-04 NOTE — Progress Notes (Unsigned)
rco

## 2020-08-05 ENCOUNTER — Ambulatory Visit (INDEPENDENT_AMBULATORY_CARE_PROVIDER_SITE_OTHER): Payer: Medicare Other | Admitting: Family Medicine

## 2020-08-05 ENCOUNTER — Encounter: Payer: Self-pay | Admitting: Family Medicine

## 2020-08-05 ENCOUNTER — Ambulatory Visit
Admission: RE | Admit: 2020-08-05 | Discharge: 2020-08-05 | Disposition: A | Discharge status: Home / Self Care | Payer: Medicare Other | Source: Ambulatory Visit | Attending: Physical Medicine & Rehabilitation | Admitting: Physical Medicine & Rehabilitation

## 2020-08-05 ENCOUNTER — Other Ambulatory Visit: Payer: Self-pay

## 2020-08-05 VITALS — BP 158/86 | HR 70 | Ht 66.0 in | Wt 215.5 lb

## 2020-08-05 DIAGNOSIS — R935 Abnormal findings on diagnostic imaging of other abdominal regions, including retroperitoneum: Secondary | ICD-10-CM | POA: Diagnosis not present

## 2020-08-05 DIAGNOSIS — R918 Other nonspecific abnormal finding of lung field: Secondary | ICD-10-CM | POA: Insufficient documentation

## 2020-08-05 DIAGNOSIS — J984 Other disorders of lung: Secondary | ICD-10-CM | POA: Diagnosis not present

## 2020-08-05 DIAGNOSIS — I700 Atherosclerosis of aorta: Secondary | ICD-10-CM | POA: Diagnosis not present

## 2020-08-05 DIAGNOSIS — I2510 Atherosclerotic heart disease of native coronary artery without angina pectoris: Secondary | ICD-10-CM | POA: Diagnosis not present

## 2020-08-05 LAB — POCT I-STAT CREATININE: Creatinine, Ser: 0.9 mg/dL (ref 0.61–1.24)

## 2020-08-05 MED ORDER — IOHEXOL 300 MG/ML  SOLN
75.0000 mL | Freq: Once | INTRAMUSCULAR | Status: AC | PRN
  Administered 2020-08-05: 75 mL via INTRAVENOUS

## 2020-08-05 NOTE — Progress Notes (Signed)
Established Patient Office Visit  SUBJECTIVE:  Subjective  Patient ID: Daniel Holden, male    DOB: [birth year]  Age: 75 y.o. MRN: [id number]  CC: No chief complaint on file.   HPI Daniel Holden is a 75 y.o. male presenting today for     Past Medical History:  Diagnosis Date  . Allergy   . Anxiety   . Hypertension   . Pancreatitis 2015  . Sleep apnea     Past Surgical History:  Procedure Laterality Date  . APPENDECTOMY     Patient states he did not have an appendectomy and does not know why this is in his chart. [facility] 06/28/2020  . KNEE ARTHROSCOPY WITH MEDIAL MENISECTOMY Left 06/25/2019   Procedure: KNEE ARTHROSCOPY WITH MEDIAL MENISECTOMY;  Surgeon: Thornton Park, MD;  Location: [facility] ORS;  Service: Orthopedics;  Laterality: Left;  . SHOULDER SURGERY    . TONSILLECTOMY      No family history on file.  Social History   Socioeconomic History  . Marital status: Married    Spouse name: Not on file  . Number of children: Not on file  . Years of education: Not on file  . Highest education level: Not on file  Occupational History  . Not on file  Tobacco Use  . Smoking status: Never Smoker  . Smokeless tobacco: Never Used  Vaping Use  . Vaping Use: Never used  Substance and Sexual Activity  . Alcohol use: Yes    Alcohol/week: 2.0 standard drinks    Types: 2 Cans of beer per week    Comment: 6 per day  . Drug use: No  . Sexual activity: Not on file  Other Topics Concern  . Not on file  Social History Narrative  . Not on file   Social Determinants of Health   Financial Resource Strain: Not on file  Food Insecurity: Not on file  Transportation Needs: Not on file  Physical Activity: Not on file  Stress: Not on file  Social Connections: Not on file  Intimate Partner Violence: Not on file     Current Outpatient Medications:  .  ALPRAZolam (XANAX) 0.5 MG tablet, Take 0.5 tablets (0.25 mg total) by mouth 2 (two) times daily as needed for sleep., Disp:  60 tablet, Rfl: 0 .  aspirin EC 325 MG tablet, Take 1 tablet (325 mg total) by mouth daily. (Patient not taking: Reported on 06/14/2020), Disp: 45 tablet, Rfl: 0 .  b complex vitamins tablet, Take 1 tablet by mouth daily. (Patient not taking: Reported on 06/14/2020), Disp: , Rfl:  .  benazepril (LOTENSIN) 20 MG tablet, Take 1 tablet (20 mg total) by mouth daily., Disp: 90 tablet, Rfl: 3 .  diphenhydrAMINE (BENADRYL) 25 MG tablet, Take 25 mg by mouth at bedtime as needed for sleep.  (Patient not taking: Reported on 06/14/2020), Disp: , Rfl:  .  fluticasone (FLONASE) 50 MCG/ACT nasal spray, Place 2 sprays into both nostrils daily. , Disp: , Rfl:  .  gabapentin (NEURONTIN) 300 MG capsule, Take 300 mg by mouth 2 (two) times daily., Disp: , Rfl:  .  HYDROcodone-acetaminophen (NORCO/VICODIN) 5-325 MG tablet, Take 1-2 tablets by mouth every 6 (six) hours as needed for moderate pain., Disp: 30 tablet, Rfl: 0 .  metoprolol tartrate (LOPRESSOR) 50 MG tablet, Take 1 tablet (50 mg total) by mouth 2 (two) times daily., Disp: 180 tablet, Rfl: 3 .  naproxen sodium (ALEVE) 220 MG tablet, Take 220-440 mg by mouth 2 (two) times  daily as needed (pain). , Disp: , Rfl:  .  sildenafil (VIAGRA) 50 MG tablet, Take 100 mg by mouth daily as needed for erectile dysfunction. , Disp: , Rfl:    Allergies  Allergen Reactions  . Dilaudid [Hydromorphone Hcl] Nausea And Vomiting    ROS Review of Systems  Constitutional: Negative.   HENT: Negative.   Respiratory: Negative.   Cardiovascular: Negative.   Genitourinary: Negative.   Psychiatric/Behavioral: Negative.      OBJECTIVE:    Physical Exam Vitals and nursing note reviewed.  Constitutional:      Appearance: He is obese.  HENT:     Right Ear: Tympanic membrane normal.     Left Ear: Tympanic membrane normal.     Mouth/Throat:     Mouth: Mucous membranes are moist.  Cardiovascular:     Rate and Rhythm: Normal rate.  Pulmonary:     Effort: Pulmonary effort is  normal.  Musculoskeletal:        General: Normal range of motion.  Neurological:     General: No focal deficit present.  Psychiatric:        Mood and Affect: Mood normal.     BP (!) 158/86   Pulse 70   Ht 5\' 6"  (1.676 m)   Wt 215 lb 8 oz (97.8 kg)   BMI 34.78 kg/m  Wt Readings from Last 3 Encounters:  08/05/20 215 lb 8 oz (97.8 kg)  12/14/19 212 lb 12.8 oz (96.5 kg)  06/25/19 205 lb 0.4 oz (93 kg)    Health Maintenance Due  Topic Date Due  . Hepatitis C Screening  Never done  . TETANUS/TDAP  Never done  . COLONOSCOPY (Pts 45-64yrs Insurance coverage will need to be confirmed)  Never done  . PNA vac Low Risk Adult (1 of 2 - PCV13) Never done    There are no preventive care reminders to display for this patient.  CBC Latest Ref Rng & Units 06/22/2019 04/22/2017 06/09/2016  WBC 4.0 - 10.5 K/uL 6.4 7.3 9.9  Hemoglobin 13.0 - 17.0 g/dL 13.0 14.6 13.9  Hematocrit 39.0 - 52.0 % 35.5(L) 41.5 38.4(L)  Platelets 150 - 400 K/uL 208 212 174   CMP Latest Ref Rng & Units 08/05/2020 06/22/2019 04/22/2017  Glucose 70 - 99 mg/dL - 108(H) 175(H)  BUN 8 - 23 mg/dL - 15 10  Creatinine 0.61 - 1.24 mg/dL 0.90 0.56(L) 0.97  Sodium 135 - 145 mmol/L - 132(L) 129(L)  Potassium 3.5 - 5.1 mmol/L - 4.0 3.0(L)  Chloride 98 - 111 mmol/L - 99 90(L)  CO2 22 - 32 mmol/L - 22 28  Calcium 8.9 - 10.3 mg/dL - 9.1 9.4  Total Protein 6.5 - 8.1 g/dL - - 8.6(H)  Total Bilirubin 0.3 - 1.2 mg/dL - - 1.4(H)  Alkaline Phos 38 - 126 U/L - - 50  AST 15 - 41 U/L - - 36  ALT 17 - 63 U/L - - 20    No results found for: TSH Lab Results  Component Value Date   ALBUMIN 4.7 04/22/2017   ANIONGAP 11 06/22/2019   Lab Results  Component Value Date   CHOL 83 09/27/2013   HDL 28 (L) 09/27/2013   LDLCALC 17 09/27/2013   Lab Results  Component Value Date   TRIG 188 09/27/2013   No results found for: HGBA1C    ASSESSMENT & PLAN:   Problem List Items Addressed This Visit      Other   Lung nodules -  Primary    Pat recently had an MRI of T Spine for evaluation of Chest Pain that has been chronic in nature for months with no findings with cardiology. An incidental finding of lobular masses multifocal were found in both lower lungs with possible pneumonia. Patient has no correlation of symptoms to match bacterial pneumonia.  Plan- After discussion of CT of the Chest with Daniel Holden we have agreed on ref to Dr. Vella Kohler in the Pulmonology Department for evaluation, we discussed options including Fine Needle Biopsy. Patient is understandably anxious about these results but is stable on d/c with appt for Monday.       Relevant Orders   Ambulatory referral to Pulmonology   Abnormal abdominal CT scan      No orders of the defined types were placed in this encounter.     Follow-up: No follow-ups on file.    Beckie Salts, [place] [address], [place], [place] [address]

## 2020-08-05 NOTE — Assessment & Plan Note (Signed)
Fraser Din recently had an MRI of T Spine for evaluation of Chest Pain that has been chronic in nature for months with no findings with cardiology. An incidental finding of lobular masses multifocal were found in both lower lungs with possible pneumonia. Patient has no correlation of symptoms to match bacterial pneumonia.  Plan- After discussion of CT of the Chest with Mr. Lindon we have agreed on ref to Dr. Vella Kohler in the Pulmonology Department for evaluation, we discussed options including Fine Needle Biopsy. Patient is understandably anxious about these results but is stable on d/c with appt for Monday.

## 2020-08-08 DIAGNOSIS — R053 Chronic cough: Secondary | ICD-10-CM | POA: Diagnosis not present

## 2020-08-08 DIAGNOSIS — R918 Other nonspecific abnormal finding of lung field: Secondary | ICD-10-CM | POA: Diagnosis not present

## 2020-08-08 DIAGNOSIS — R059 Cough, unspecified: Secondary | ICD-10-CM | POA: Diagnosis not present

## 2020-08-08 DIAGNOSIS — G4733 Obstructive sleep apnea (adult) (pediatric): Secondary | ICD-10-CM | POA: Diagnosis not present

## 2020-08-17 ENCOUNTER — Other Ambulatory Visit: Payer: Self-pay

## 2020-08-17 ENCOUNTER — Encounter: Payer: Self-pay | Admitting: Orthopaedic Surgery

## 2020-08-17 ENCOUNTER — Ambulatory Visit (INDEPENDENT_AMBULATORY_CARE_PROVIDER_SITE_OTHER): Payer: Medicare Other | Admitting: Orthopaedic Surgery

## 2020-08-17 DIAGNOSIS — M25511 Pain in right shoulder: Secondary | ICD-10-CM

## 2020-08-17 DIAGNOSIS — Z9889 Other specified postprocedural states: Secondary | ICD-10-CM

## 2020-08-17 DIAGNOSIS — G8929 Other chronic pain: Secondary | ICD-10-CM

## 2020-08-17 NOTE — Progress Notes (Signed)
The patient is now 2-week status post a right carpal tunnel release.  He is doing well.  He says some of his arm pain and chest pain is resolving because he also found out he had some type of process going on the lung and he had amoxicillin.  He still does with chronic right shoulder pain and a history of a dislocation of the right shoulder.  He is only 75 years old.  He is also dealt with chronic left knee pain and has had hyaluronic acid med left knee in the past by orthopedic specialist in [place].  He is pleased this for in the postop period following his right carpal tunnel release.  Examination of the right hand his incision actually looks good.  I remove the remaining sutures in place Steri-Strips.  His hand is well-perfused he moves his fingers and thumb easily.  I would like to see him back in a month to see how he is doing overall from his postoperative recovery from his hand surgery.  We do need to obtain a MRI arthrogram of the right shoulder to assess for labral tear given his chronic issues with that shoulder and the dislocation in the past.  At his next visit we will also go over that MRI and obtain standing AP and lateral of the left knee to assess his knee.  All question concerns were answered and addressed.

## 2020-08-17 NOTE — Progress Notes (Signed)
arthrogram

## 2020-08-18 ENCOUNTER — Telehealth: Payer: Self-pay | Admitting: Physician Assistant

## 2020-08-18 NOTE — Telephone Encounter (Signed)
Pt called stating he has a few questions for Artis Delay in regards to a procedure coming up  [phone]

## 2020-08-18 NOTE — Telephone Encounter (Signed)
I spoke with the Daniel Holden. He stated Dr. Ninfa Linden ordered a Mri of his shoulder. He stated he wanted [name]'s opinion on if he should get the MRI now in march or wait until next year. He would be able to consider having surgery this year. Please advise

## 2020-08-18 NOTE — Telephone Encounter (Signed)
Please cancel MRI ARTHROGRAM OF HIS SHOULDER.  CANCEL NEXT OFFICE VISIT ALSO.

## 2020-08-19 NOTE — Telephone Encounter (Signed)
I spoke with him.

## 2020-08-19 NOTE — Telephone Encounter (Signed)
Did you talk to the pt ? Do I need to call and tell him we are doing this?

## 2020-08-23 ENCOUNTER — Encounter: Payer: Self-pay | Admitting: Internal Medicine

## 2020-08-23 ENCOUNTER — Ambulatory Visit (INDEPENDENT_AMBULATORY_CARE_PROVIDER_SITE_OTHER): Payer: Medicare Other | Admitting: Internal Medicine

## 2020-08-23 ENCOUNTER — Other Ambulatory Visit: Payer: Self-pay

## 2020-08-23 VITALS — BP 160/85 | HR 72 | Ht 66.0 in | Wt 216.6 lb

## 2020-08-23 DIAGNOSIS — I10 Essential (primary) hypertension: Secondary | ICD-10-CM

## 2020-08-23 DIAGNOSIS — I208 Other forms of angina pectoris: Secondary | ICD-10-CM

## 2020-08-23 DIAGNOSIS — G4733 Obstructive sleep apnea (adult) (pediatric): Secondary | ICD-10-CM | POA: Diagnosis not present

## 2020-08-23 DIAGNOSIS — E6609 Other obesity due to excess calories: Secondary | ICD-10-CM

## 2020-08-23 DIAGNOSIS — F419 Anxiety disorder, unspecified: Secondary | ICD-10-CM | POA: Diagnosis not present

## 2020-08-23 DIAGNOSIS — Z9989 Dependence on other enabling machines and devices: Secondary | ICD-10-CM | POA: Diagnosis not present

## 2020-08-23 DIAGNOSIS — Z6832 Body mass index (BMI) 32.0-32.9, adult: Secondary | ICD-10-CM

## 2020-08-23 DIAGNOSIS — R918 Other nonspecific abnormal finding of lung field: Secondary | ICD-10-CM | POA: Diagnosis not present

## 2020-08-23 MED ORDER — BENAZEPRIL HCL 40 MG PO TABS: 40.0000 mg | ORAL_TABLET | Freq: Every day | ORAL | 2 refills | Status: DC

## 2020-08-23 NOTE — Assessment & Plan Note (Signed)
Patient want to be referred to cardiology for nuclear stress test.

## 2020-08-23 NOTE — Assessment & Plan Note (Signed)
stable °

## 2020-08-23 NOTE — Assessment & Plan Note (Signed)
-   Patient experiencing high levels of anxiety.  - Encouraged patient to engage in relaxing activities like yoga, meditation, journaling, going for a walk, or participating in a hobby.  - Encouraged patient to reach out to trusted friends or family members about recent struggles 

## 2020-08-23 NOTE — Assessment & Plan Note (Signed)
Ref to pulmonary

## 2020-08-23 NOTE — Progress Notes (Signed)
Established Patient Office Visit  Subjective:  Patient ID: LADD CEN, male    DOB: 1945/08/29  Age: 76 y.o. MRN: [id number]  CC:  Chief Complaint  Patient presents with  . Requesting referral     Patient is requesting referral to [facility] heart care, he is wanting to have a chemical stress test      Daniel Holden presents for   Subjective:   Patient is a 75 y.o. male who presents for evaluation of chest pain Quality of the pain is described as pressure.and aching  Location: retrosternal area. Occurs only with exertion.  Duration: intermittent (<1 minute).  The symptoms occur weekly.  Associated symptoms include: shortness of breath.  The pattern of symptoms has been worsening with increased frequency.  Precipitating factors: exertion.  Factors which relieve the discomfort are rest.  The pain began 1 year(s) ago.  The characteristics of the pain suggestangina.  The severity on a scale of 1 to 10 is 7.  The [place] [employer] Society angina scale is 3.   Previous diagnostic testing for coronary artery disease includes: exercise treadmill test. Previous history of cardiac disease includes None. Coronary artery disease risk factors include: dyslipidemia. Patient denies history of angina.    Review of Systems   Past Medical History:  Diagnosis Date  . Allergy   . Anxiety   . Hypertension   . Pancreatitis 2015  . Sleep apnea     Past Surgical History:  Procedure Laterality Date  . APPENDECTOMY     Patient states he did not have an appendectomy and does not know why this is in his chart. [facility] 06/28/2020  . KNEE ARTHROSCOPY WITH MEDIAL MENISECTOMY Left 06/25/2019   Procedure: KNEE ARTHROSCOPY WITH MEDIAL MENISECTOMY;  Surgeon: Thornton Park, MD;  Location: [facility] ORS;  Service: Orthopedics;  Laterality: Left;  . SHOULDER SURGERY    . TONSILLECTOMY      History reviewed. No pertinent family history.  Social History   Socioeconomic History  . Marital  status: Married    Spouse name: Not on file  . Number of children: Not on file  . Years of education: Not on file  . Highest education level: Not on file  Occupational History  . Not on file  Tobacco Use  . Smoking status: Never Smoker  . Smokeless tobacco: Never Used  Vaping Use  . Vaping Use: Never used  Substance and Sexual Activity  . Alcohol use: Yes    Alcohol/week: 2.0 standard drinks    Types: 2 Cans of beer per week    Comment: 6 per day  . Drug use: No  . Sexual activity: Not on file  Other Topics Concern  . Not on file  Social History Narrative  . Not on file   Social Determinants of Health   Financial Resource Strain: Not on file  Food Insecurity: Not on file  Transportation Needs: Not on file  Physical Activity: Not on file  Stress: Not on file  Social Connections: Not on file  Intimate Partner Violence: Not on file     Current Outpatient Medications:  .  ALPRAZolam (XANAX) 0.5 MG tablet, Take 0.5 tablets (0.25 mg total) by mouth 2 (two) times daily as needed for sleep., Disp: 60 tablet, Rfl: 0 .  benazepril (LOTENSIN) 20 MG tablet, Take 1 tablet (20 mg total) by mouth daily., Disp: 90 tablet, Rfl: 3 .  fluticasone (FLONASE) 50 MCG/ACT nasal spray, Place 2 sprays into both nostrils daily. , Disp: ,  Rfl:  .  metoprolol tartrate (LOPRESSOR) 50 MG tablet, Take 1 tablet (50 mg total) by mouth 2 (two) times daily., Disp: 180 tablet, Rfl: 3 .  naproxen sodium (ALEVE) 220 MG tablet, Take 220-440 mg by mouth 2 (two) times daily as needed (pain). , Disp: , Rfl:  .  aspirin EC 325 MG tablet, Take 1 tablet (325 mg total) by mouth daily. (Patient not taking: Reported on 06/14/2020), Disp: 45 tablet, Rfl: 0 .  b complex vitamins tablet, Take 1 tablet by mouth daily. (Patient not taking: Reported on 06/14/2020), Disp: , Rfl:  .  diphenhydrAMINE (BENADRYL) 25 MG tablet, Take 25 mg by mouth at bedtime as needed for sleep.  (Patient not taking: Reported on 06/14/2020), Disp: ,  Rfl:  .  gabapentin (NEURONTIN) 300 MG capsule, Take 300 mg by mouth 2 (two) times daily., Disp: , Rfl:  .  HYDROcodone-acetaminophen (NORCO/VICODIN) 5-325 MG tablet, Take 1-2 tablets by mouth every 6 (six) hours as needed for moderate pain., Disp: 30 tablet, Rfl: 0 .  sildenafil (VIAGRA) 50 MG tablet, Take 100 mg by mouth daily as needed for erectile dysfunction. , Disp: , Rfl:    Allergies  Allergen Reactions  . Dilaudid [Hydromorphone Hcl] Nausea And Vomiting    ROS Review of Systems  Constitutional: Negative.  Negative for appetite change and diaphoresis.  HENT: Negative.  Negative for congestion.   Eyes: Negative.  Negative for pain.  Respiratory: Positive for shortness of breath. Negative for cough, choking and wheezing.   Cardiovascular: Positive for chest pain. Negative for palpitations and leg swelling.  Gastrointestinal: Negative.  Negative for abdominal pain and blood in stool.  Endocrine: Negative.  Negative for polydipsia.  Genitourinary: Negative.  Negative for hematuria.  Musculoskeletal: Negative.  Negative for gait problem.  Skin: Negative.   Allergic/Immunologic: Negative.   Neurological: Negative.  Negative for dizziness and headaches.  Hematological: Negative.   Psychiatric/Behavioral: Negative.  Negative for agitation.  All other systems reviewed and are negative.     Objective:    Physical Exam Vitals reviewed.  Constitutional:      Appearance: Normal appearance.  HENT:     Mouth/Throat:     Mouth: Mucous membranes are moist.  Eyes:     Pupils: Pupils are equal, round, and reactive to light.  Neck:     Vascular: No carotid bruit.  Cardiovascular:     Rate and Rhythm: Normal rate and regular rhythm.     Pulses: Normal pulses.     Heart sounds: Normal heart sounds.  Pulmonary:     Effort: Pulmonary effort is normal.     Breath sounds: Normal breath sounds.  Abdominal:     General: Bowel sounds are normal.     Palpations: Abdomen is soft. There  is no hepatomegaly, splenomegaly or mass.     Tenderness: There is no abdominal tenderness.     Hernia: No hernia is present.  Musculoskeletal:     Cervical back: Neck supple.     Right lower leg: No edema.     Left lower leg: No edema.  Skin:    Findings: No rash.  Neurological:     Mental Status: He is alert and oriented to person, place, and time.     Motor: No weakness.  Psychiatric:        Mood and Affect: Mood normal.        Behavior: Behavior normal.     BP (!) 160/85   Pulse 72   Ht  5\' 6"  (1.676 m)   Wt 216 lb 9.6 oz (98.2 kg)   BMI 34.96 kg/m  Wt Readings from Last 3 Encounters:  08/23/20 216 lb 9.6 oz (98.2 kg)  08/05/20 215 lb 8 oz (97.8 kg)  12/14/19 212 lb 12.8 oz (96.5 kg)     Health Maintenance Due  Topic Date Due  . Hepatitis C Screening  Never done  . TETANUS/TDAP  Never done  . COLONOSCOPY (Pts 45-82yrs Insurance coverage will need to be confirmed)  Never done  . PNA vac Low Risk Adult (1 of 2 - PCV13) Never done    There are no preventive care reminders to display for this patient.  No results found for: TSH Lab Results  Component Value Date   WBC 6.4 06/22/2019   HGB 13.0 06/22/2019   HCT 35.5 (L) 06/22/2019   MCV 94.7 06/22/2019   PLT 208 06/22/2019   Lab Results  Component Value Date   NA 132 (L) 06/22/2019   K 4.0 06/22/2019   CO2 22 06/22/2019   GLUCOSE 108 (H) 06/22/2019   BUN 15 06/22/2019   CREATININE 0.90 08/05/2020   BILITOT 1.4 (H) 04/22/2017   ALKPHOS 50 04/22/2017   AST 36 04/22/2017   ALT 20 04/22/2017   PROT 8.6 (H) 04/22/2017   ALBUMIN 4.7 04/22/2017   CALCIUM 9.1 06/22/2019   ANIONGAP 11 06/22/2019   Lab Results  Component Value Date   CHOL 83 09/27/2013   Lab Results  Component Value Date   HDL 28 (L) 09/27/2013   Lab Results  Component Value Date   LDLCALC 17 09/27/2013   Lab Results  Component Value Date   TRIG 188 09/27/2013   No results found for: CHOLHDL No results found for: HGBA1C     Assessment & Plan:   Problem List Items Addressed This Visit      Cardiovascular and Mediastinum   Primary hypertension    - Today, the patient's blood pressure is well managed on med. - The patient will continue the current treatment regimen.  - I encouraged the patient to eat a low-sodium diet to help control blood pressure. - I encouraged the patient to live an active lifestyle and complete activities that increases heart rate to 85% target heart rate at least 5 times per week for one hour.          Angina of effort [facility]) - Primary    Patient want to be referred to cardiology for nuclear stress test.      Relevant Orders   Ambulatory referral to Cardiology   EKG 12-Lead     Respiratory   OSA on CPAP    stable        Other   Anxiety    - Patient experiencing high levels of anxiety.  - Encouraged patient to engage in relaxing activities like yoga, meditation, journaling, going for a walk, or participating in a hobby.  - Encouraged patient to reach out to trusted friends or family members about recent struggles       Class 1 obesity due to excess calories with serious comorbidity and body mass index (BMI) of 32.0 to 32.9 in adult    - I encouraged the patient to lose weight.  - I educated them on making healthy dietary choices including eating more fruits and vegetables and less fried foods. - I encouraged the patient to exercise more, and educated on the benefits of exercise including weight loss, diabetes prevention, and hypertension prevention.  Lung nodules    Ref to pulmonary         No orders of the defined types were placed in this encounter.   Follow-up: No follow-ups on file.    Cletis Athens, MD

## 2020-08-23 NOTE — Assessment & Plan Note (Signed)
-   I encouraged the patient to lose weight.  - I educated them on making healthy dietary choices including eating more fruits and vegetables and less fried foods. - I encouraged the patient to exercise more, and educated on the benefits of exercise including weight loss, diabetes prevention, and hypertension prevention.   

## 2020-08-23 NOTE — Assessment & Plan Note (Signed)
-   Today, the patient's blood pressure is well managed on med. - The patient will continue the current treatment regimen.  - I encouraged the patient to eat a low-sodium diet to help control blood pressure. - I encouraged the patient to live an active lifestyle and complete activities that increases heart rate to 85% target heart rate at least 5 times per week for one hour.

## 2020-08-23 NOTE — Addendum Note (Signed)
Addended by: Alois Cliche on: 08/23/2020 03:54 PM   Modules accepted: Orders

## 2020-09-07 DIAGNOSIS — E538 Deficiency of other specified B group vitamins: Secondary | ICD-10-CM | POA: Diagnosis not present

## 2020-09-07 DIAGNOSIS — Z79899 Other long term (current) drug therapy: Secondary | ICD-10-CM | POA: Diagnosis not present

## 2020-09-12 ENCOUNTER — Other Ambulatory Visit: Payer: Medicare Other

## 2020-09-14 ENCOUNTER — Other Ambulatory Visit: Payer: Self-pay | Admitting: Specialist

## 2020-09-14 ENCOUNTER — Ambulatory Visit: Payer: Medicare Other | Admitting: Orthopaedic Surgery

## 2020-09-14 DIAGNOSIS — R918 Other nonspecific abnormal finding of lung field: Secondary | ICD-10-CM

## 2020-09-14 DIAGNOSIS — I209 Angina pectoris, unspecified: Secondary | ICD-10-CM | POA: Diagnosis not present

## 2020-09-14 DIAGNOSIS — M5412 Radiculopathy, cervical region: Secondary | ICD-10-CM | POA: Diagnosis not present

## 2020-09-14 DIAGNOSIS — Z9989 Dependence on other enabling machines and devices: Secondary | ICD-10-CM | POA: Diagnosis not present

## 2020-09-14 DIAGNOSIS — R0602 Shortness of breath: Secondary | ICD-10-CM | POA: Diagnosis not present

## 2020-09-14 DIAGNOSIS — M79601 Pain in right arm: Secondary | ICD-10-CM | POA: Diagnosis not present

## 2020-09-14 DIAGNOSIS — R202 Paresthesia of skin: Secondary | ICD-10-CM | POA: Diagnosis not present

## 2020-09-14 DIAGNOSIS — G4733 Obstructive sleep apnea (adult) (pediatric): Secondary | ICD-10-CM | POA: Diagnosis not present

## 2020-09-14 DIAGNOSIS — M79602 Pain in left arm: Secondary | ICD-10-CM | POA: Diagnosis not present

## 2020-09-15 ENCOUNTER — Encounter: Payer: Self-pay | Admitting: Cardiology

## 2020-09-15 ENCOUNTER — Other Ambulatory Visit: Payer: Self-pay

## 2020-09-15 ENCOUNTER — Ambulatory Visit (INDEPENDENT_AMBULATORY_CARE_PROVIDER_SITE_OTHER): Payer: Medicare Other | Admitting: Cardiology

## 2020-09-15 VITALS — BP 137/80 | HR 63 | Ht 66.0 in | Wt 214.2 lb

## 2020-09-15 DIAGNOSIS — R931 Abnormal findings on diagnostic imaging of heart and coronary circulation: Secondary | ICD-10-CM | POA: Diagnosis not present

## 2020-09-15 DIAGNOSIS — R079 Chest pain, unspecified: Secondary | ICD-10-CM

## 2020-09-15 DIAGNOSIS — I208 Other forms of angina pectoris: Secondary | ICD-10-CM | POA: Diagnosis not present

## 2020-09-15 DIAGNOSIS — R072 Precordial pain: Secondary | ICD-10-CM

## 2020-09-15 DIAGNOSIS — I10 Essential (primary) hypertension: Secondary | ICD-10-CM

## 2020-09-15 NOTE — Addendum Note (Signed)
Addended by: Antonieta Iba on: 09/15/2020 09:16 AM   Modules accepted: Orders

## 2020-09-15 NOTE — Progress Notes (Signed)
Cardiology Office Note    Date:  09/15/2020   ID:  Daniel Holden, DOB 1945-11-18, MRN [id number]  PCP:  Cletis Athens, MD  Cardiologist:  Fransico Him, MD   Chief Complaint  Patient presents with  . New Patient (Initial Visit)    Chest pain     History of Present Illness:  Daniel Holden is a 75 y.o. male who is being seen today for the evaluation of Chest pain at the request of Cletis Athens, MD.  This is a 75yo male with a hx of anxiety, HTN and obesity who was referred for evaluation of chest pain.  He tells me that a year ago he started waking up in the middle of the night with severe pains in the his arms every other night.  He had a ETT that was normal.  He then was dx with severe carpal tunnel in his arms by EMG.  He underwent occupational therapy and PT with no improvement.  He has had an extensive workup with MRI of the spine and was started on gabapentin with no improvement.  He underwent carpal tunnel surgery on the right had with some improvement.    He says that then he starting developing pain in his chest with any exertion.  He describes it as a dull ache with an electrical shock that goes down his arms.  Now he gets pain in his chest every time he exerts himself but low grade at 2/10.  If he walks up the steps or walks to the mailbox, he gets pain in his chest.  When he gets the chest pain he will feel SOB but no associated nausea or diaphoresis.  He used to smoke but quit 40 years ago.    Past Medical History:  Diagnosis Date  . Allergy   . 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 Anxiety   . Carpal tunnel syndrome   . Hypertension   . Lung nodule   . Obesity   . Pancreatitis 2015  . Sleep apnea     Past Surgical History:  Procedure Laterality Date  . APPENDECTOMY     Patient states he did not have an appendectomy and does not know why this is in his chart. [facility] 06/28/2020  . CARPAL TUNNEL RELEASE    . KNEE ARTHROSCOPY WITH MEDIAL MENISECTOMY Left 06/25/2019    Procedure: KNEE ARTHROSCOPY WITH MEDIAL MENISECTOMY;  Surgeon: Thornton Park, MD;  Location: [facility] ORS;  Service: Orthopedics;  Laterality: Left;  . SHOULDER SURGERY    . TONSILLECTOMY      Current Medications: Current Meds  Medication Sig  . ALPRAZolam (XANAX) 0.25 MG tablet Take 0.25 mg by mouth as directed. sleep  . aspirin 81 MG EC tablet Take 81 mg by mouth daily.  . benazepril (LOTENSIN) 40 MG tablet Take 1 tablet (40 mg total) by mouth daily.  . Cholecalciferol 125 MCG (5000 UT) TABS Take 1 capsule by mouth daily.  . cyanocobalamin 1000 MCG tablet Take 1 tablet by mouth daily.  . fluticasone (FLONASE) 50 MCG/ACT nasal spray Place 2 sprays into both nostrils daily.   [name] gabapentin (NEURONTIN) 300 MG capsule Take 300 mg by mouth 2 (two) times daily.  . hydrochlorothiazide (MICROZIDE) 12.5 MG capsule Take 12.5 mg by mouth daily.  . melatonin 3 MG TABS tablet Take 3 mg by mouth at bedtime. sleep  . metoprolol tartrate (LOPRESSOR) 50 MG tablet Take 1 tablet (50 mg total) by mouth 2 (two) times daily.  [name]  naproxen sodium (ALEVE) 220 MG tablet Take 220-440 mg by mouth 2 (two) times daily as needed (pain).   [name] omeprazole (PRILOSEC) 10 MG capsule Take 10 mg by mouth daily.    Allergies:   Dilaudid [hydromorphone hcl]   Social History   Socioeconomic History  . Marital status: Married    Spouse name: Not on file  . Number of children: Not on file  . Years of education: Not on file  . Highest education level: Not on file  Occupational History  . Not on file  Tobacco Use  . Smoking status: Never Smoker  . Smokeless tobacco: Never Used  Vaping Use  . Vaping Use: Never used  Substance and Sexual Activity  . Alcohol use: Yes    Alcohol/week: 2.0 standard drinks    Types: 2 Cans of beer per week    Comment: 6 per day  . Drug use: No  . Sexual activity: Not on file  Other Topics Concern  . Not on file  Social History Narrative  . Not on file   Social Determinants of Health    Financial Resource Strain: Not on file  Food Insecurity: Not on file  Transportation Needs: Not on file  Physical Activity: Not on file  Stress: Not on file  Social Connections: Not on file     Family History:  The patient's family history includes CVA in his father.   ROS:   Please see the history of present illness.    ROS All other systems reviewed and are negative.  No flowsheet data found.     PHYSICAL EXAM:   VS:  BP 137/80   Pulse 63   Ht 5\' 6"  (1.676 m)   Wt 214 lb 3.2 oz (97.2 kg)   SpO2 97%   BMI 34.57 kg/m    GEN: Well nourished, well developed, in no acute distress  HEENT: normal  Neck: no JVD, carotid bruits, or masses Cardiac: RRR; no murmurs, rubs, or gallops,no edema.  Intact distal pulses bilaterally.  Respiratory:  clear to auscultation bilaterally, normal work of breathing GI: soft, nontender, nondistended, + BS MS: no deformity or atrophy  Skin: warm and dry, no rash Neuro:  Alert and Oriented x 3, Strength and sensation are intact Psych: euthymic mood, full affect  Wt Readings from Last 3 Encounters:  09/15/20 214 lb 3.2 oz (97.2 kg)  08/23/20 216 lb 9.6 oz (98.2 kg)  08/05/20 215 lb 8 oz (97.8 kg)      Studies/Labs Reviewed:   EKG:  EKG is not ordered today.    Recent Labs: 08/05/2020: Creatinine, Ser 0.90   Lipid Panel    Component Value Date/Time   CHOL 83 09/27/2013 0319   TRIG 188 09/27/2013 0319   HDL 28 (L) 09/27/2013 0319   VLDL 38 09/27/2013 0319   LDLCALC 17 09/27/2013 0319    Additional studies/ records that were reviewed today include:  OV notes from PCP    ASSESSMENT:    1. Chest pain of uncertain etiology   2. Primary hypertension      PLAN:  In order of problems listed above:  1. Chest pain -his CRFs including HTN, advanced age and remote hx smoking -EKG in MD office was normal -I will get a coronary CTA to define coronary anatomy  2.  HTN -BP is adequately controlled on exam today -continue  Lotensin 40mg  daily, HCTZ 12.5mg  daily    Medication Adjustments/Labs and Tests Ordered: Current medicines are reviewed at length  with the patient today.  Concerns regarding medicines are outlined above.  Medication changes, Labs and Tests ordered today are listed in the Patient Instructions below.  There are no Patient Instructions on file for this visit.   Signed, Fransico Him, MD  09/15/2020 8:59 AM    [facility] [place], [place], [place]  [address] Phone: [phone]; Fax: [phone]

## 2020-09-15 NOTE — Addendum Note (Signed)
Addended by: Antonieta Iba on: 09/15/2020 09:06 AM   Modules accepted: Orders

## 2020-09-15 NOTE — H&P (View-Only) (Signed)
Cardiology Office Note    Date:  09/15/2020   ID:  Daniel Holden, DOB Jun 06, 1946, MRN [id number]  PCP:  Cletis Athens, MD  Cardiologist:  Fransico Him, MD   Chief Complaint  Patient presents with  . New Patient (Initial Visit)    Chest pain     History of Present Illness:  Daniel Holden is a 75 y.o. male who is being seen today for the evaluation of Chest pain at the request of Cletis Athens, MD.  This is a 75yo male with a hx of anxiety, HTN and obesity who was referred for evaluation of chest pain.  He tells me that a year ago he started waking up in the middle of the night with severe pains in the his arms every other night.  He had a ETT that was normal.  He then was dx with severe carpal tunnel in his arms by EMG.  He underwent occupational therapy and PT with no improvement.  He has had an extensive workup with MRI of the spine and was started on gabapentin with no improvement.  He underwent carpal tunnel surgery on the right had with some improvement.    He says that then he starting developing pain in his chest with any exertion.  He describes it as a dull ache with an electrical shock that goes down his arms.  Now he gets pain in his chest every time he exerts himself but low grade at 2/10.  If he walks up the steps or walks to the mailbox, he gets pain in his chest.  When he gets the chest pain he will feel SOB but no associated nausea or diaphoresis.  He used to smoke but quit 40 years ago.    Past Medical History:  Diagnosis Date  . Allergy   . 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 Anxiety   . Carpal tunnel syndrome   . Hypertension   . Lung nodule   . Obesity   . Pancreatitis 2015  . Sleep apnea     Past Surgical History:  Procedure Laterality Date  . APPENDECTOMY     Patient states he did not have an appendectomy and does not know why this is in his chart. [facility] 06/28/2020  . CARPAL TUNNEL RELEASE    . KNEE ARTHROSCOPY WITH MEDIAL MENISECTOMY Left 06/25/2019    Procedure: KNEE ARTHROSCOPY WITH MEDIAL MENISECTOMY;  Surgeon: Thornton Park, MD;  Location: [facility] ORS;  Service: Orthopedics;  Laterality: Left;  . SHOULDER SURGERY    . TONSILLECTOMY      Current Medications: Current Meds  Medication Sig  . ALPRAZolam (XANAX) 0.25 MG tablet Take 0.25 mg by mouth as directed. sleep  . aspirin 81 MG EC tablet Take 81 mg by mouth daily.  . benazepril (LOTENSIN) 40 MG tablet Take 1 tablet (40 mg total) by mouth daily.  . Cholecalciferol 125 MCG (5000 UT) TABS Take 1 capsule by mouth daily.  . cyanocobalamin 1000 MCG tablet Take 1 tablet by mouth daily.  . fluticasone (FLONASE) 50 MCG/ACT nasal spray Place 2 sprays into both nostrils daily.   [name] gabapentin (NEURONTIN) 300 MG capsule Take 300 mg by mouth 2 (two) times daily.  . hydrochlorothiazide (MICROZIDE) 12.5 MG capsule Take 12.5 mg by mouth daily.  . melatonin 3 MG TABS tablet Take 3 mg by mouth at bedtime. sleep  . metoprolol tartrate (LOPRESSOR) 50 MG tablet Take 1 tablet (50 mg total) by mouth 2 (two) times daily.  [name]  naproxen sodium (ALEVE) 220 MG tablet Take 220-440 mg by mouth 2 (two) times daily as needed (pain).   [name] omeprazole (PRILOSEC) 10 MG capsule Take 10 mg by mouth daily.    Allergies:   Dilaudid [hydromorphone hcl]   Social History   Socioeconomic History  . Marital status: Married    Spouse name: Not on file  . Number of children: Not on file  . Years of education: Not on file  . Highest education level: Not on file  Occupational History  . Not on file  Tobacco Use  . Smoking status: Never Smoker  . Smokeless tobacco: Never Used  Vaping Use  . Vaping Use: Never used  Substance and Sexual Activity  . Alcohol use: Yes    Alcohol/week: 2.0 standard drinks    Types: 2 Cans of beer per week    Comment: 6 per day  . Drug use: No  . Sexual activity: Not on file  Other Topics Concern  . Not on file  Social History Narrative  . Not on file   Social Determinants of Health    Financial Resource Strain: Not on file  Food Insecurity: Not on file  Transportation Needs: Not on file  Physical Activity: Not on file  Stress: Not on file  Social Connections: Not on file     Family History:  The patient's family history includes CVA in his father.   ROS:   Please see the history of present illness.    ROS All other systems reviewed and are negative.  No flowsheet data found.     PHYSICAL EXAM:   VS:  BP 137/80   Pulse 63   Ht 5\' 6"  (1.676 m)   Wt 214 lb 3.2 oz (97.2 kg)   SpO2 97%   BMI 34.57 kg/m    GEN: Well nourished, well developed, in no acute distress  HEENT: normal  Neck: no JVD, carotid bruits, or masses Cardiac: RRR; no murmurs, rubs, or gallops,no edema.  Intact distal pulses bilaterally.  Respiratory:  clear to auscultation bilaterally, normal work of breathing GI: soft, nontender, nondistended, + BS MS: no deformity or atrophy  Skin: warm and dry, no rash Neuro:  Alert and Oriented x 3, Strength and sensation are intact Psych: euthymic mood, full affect  Wt Readings from Last 3 Encounters:  09/15/20 214 lb 3.2 oz (97.2 kg)  08/23/20 216 lb 9.6 oz (98.2 kg)  08/05/20 215 lb 8 oz (97.8 kg)      Studies/Labs Reviewed:   EKG:  EKG is not ordered today.    Recent Labs: 08/05/2020: Creatinine, Ser 0.90   Lipid Panel    Component Value Date/Time   CHOL 83 09/27/2013 0319   TRIG 188 09/27/2013 0319   HDL 28 (L) 09/27/2013 0319   VLDL 38 09/27/2013 0319   LDLCALC 17 09/27/2013 0319    Additional studies/ records that were reviewed today include:  OV notes from PCP    ASSESSMENT:    1. Chest pain of uncertain etiology   2. Primary hypertension      PLAN:  In order of problems listed above:  1. Chest pain -his CRFs including HTN, advanced age and remote hx smoking -EKG in MD office was normal -I will get a coronary CTA to define coronary anatomy  2.  HTN -BP is adequately controlled on exam today -continue  Lotensin 40mg  daily, HCTZ 12.5mg  daily    Medication Adjustments/Labs and Tests Ordered: Current medicines are reviewed at length  with the patient today.  Concerns regarding medicines are outlined above.  Medication changes, Labs and Tests ordered today are listed in the Patient Instructions below.  There are no Patient Instructions on file for this visit.   Signed, Fransico Him, MD  09/15/2020 8:59 AM    [facility] [place], [place], [place]  [address] Phone: [phone]; Fax: [phone]

## 2020-09-15 NOTE — Patient Instructions (Addendum)
Medication Instructions:  Your physician recommends that you continue on your current medications as directed. Please refer to the Current Medication list given to you today.  *If you need a refill on your cardiac medications before your next appointment, please call your pharmacy*  Lab Work: BMET prior to CT scan   Testing/Procedures: Your provider has recommended that you have a coronary CTA scan. Please see below for further instructions  Follow-Up: At [facility], you and your health needs are our priority.  As part of our continuing mission to provide you with exceptional heart care, we have created designated Provider Care Teams.  These Care Teams include your primary Cardiologist (physician) and Advanced Practice Providers (APPs -  Physician Assistants and Nurse Practitioners) who all work together to provide you with the care you need, when you need it.  Your next appointment:   1 year(s)  The format for your next appointment:   In Person  Provider:   You may see Fransico Him, MD or one of the following Advanced Practice Providers on your designated Care Team:    Melina Copa, PA-C  Ermalinda Barrios, PA-C  Other Instructions Your cardiac CT will be scheduled at one of the below locations:   [facility] [address] [place], [place] [address] [phone]  Please arrive at the [facility] main entrance (entrance A) of [facility] 30 minutes prior to test start time. Proceed to the [facility] Radiology Department (first floor) to check-in and test prep.  If scheduled at [facility], please arrive 15 mins early for check-in and test prep.  Please follow these instructions carefully (unless otherwise directed):  Hold all erectile dysfunction medications at least 3 days (72 hrs) prior to test.  On the Night Before the Test: . Be sure to Drink plenty of water. . Do not consume any caffeinated/decaffeinated beverages or  chocolate 12 hours prior to your test. . Do not take any antihistamines 12 hours prior to your test.  On the Day of the Test: . Drink plenty of water until 1 hour prior to the test. . Do not eat any food 4 hours prior to the test. . You may take your regular medications prior to the test.  . Take metoprolol (Lopressor) 100 mg two hours prior to test. . HOLD Hydrochlorothiazide morning of the test.     After the Test: . Drink plenty of water. . After receiving IV contrast, you may experience a mild flushed feeling. This is normal. . On occasion, you may experience a mild rash up to 24 hours after the test. This is not dangerous. If this occurs, you can take Benadryl 25 mg and increase your fluid intake. . If you experience trouble breathing, this can be serious. If it is severe call 911 IMMEDIATELY. If it is mild, please call our office. . If you take any of these medications: Glipizide/Metformin, Avandament, Glucavance, please do not take 48 hours after completing test unless otherwise instructed.   Once we have confirmed authorization from your insurance company, we will call you to set up a date and time for your test. Based on how quickly your insurance processes prior authorizations requests, please allow up to 4 weeks to be contacted for scheduling your Cardiac CT appointment. Be advised that routine Cardiac CT appointments could be scheduled as many as 8 weeks after your provider has ordered it.  For non-scheduling related questions, please contact the cardiac imaging nurse navigator should you  have any questions/concerns: Marchia Bond, Cardiac Imaging Nurse Navigator Gordy Clement, Cardiac Imaging Nurse Navigator [facility] Direct Office Dial: [phone]   For scheduling needs, including cancellations and rescheduling, please call [place], [phone].

## 2020-09-26 ENCOUNTER — Telehealth (HOSPITAL_COMMUNITY): Payer: Self-pay | Admitting: *Deleted

## 2020-09-26 NOTE — Telephone Encounter (Signed)
Returning pt's call regarding upcoming cardiac imaging study; pt verbalizes understanding of appt date/time, parking situation and where to check in, pre-test NPO status and medications ordered, and verified current allergies; name and call back number provided for further questions should they arise  Gordy Clement RN Navigator Cardiac Imaging [place] [facility] [phone] office [phone] cell

## 2020-09-27 DIAGNOSIS — R079 Chest pain, unspecified: Secondary | ICD-10-CM | POA: Diagnosis not present

## 2020-09-27 DIAGNOSIS — M1712 Unilateral primary osteoarthritis, left knee: Secondary | ICD-10-CM | POA: Diagnosis not present

## 2020-09-28 LAB — BASIC METABOLIC PANEL
BUN/Creatinine Ratio: 13 (ref 10–24)
BUN: 11 mg/dL (ref 8–27)
CO2: 22 mmol/L (ref 20–29)
Calcium: 9.3 mg/dL (ref 8.6–10.2)
Chloride: 93 mmol/L — ABNORMAL LOW (ref 96–106)
Creatinine, Ser: 0.84 mg/dL (ref 0.76–1.27)
Glucose: 137 mg/dL — ABNORMAL HIGH (ref 65–99)
Potassium: 4.7 mmol/L (ref 3.5–5.2)
Sodium: 134 mmol/L (ref 134–144)
eGFR: 93 mL/min/{1.73_m2} (ref >59)

## 2020-09-29 ENCOUNTER — Ambulatory Visit
Admission: RE | Admit: 2020-09-29 | Discharge: 2020-09-29 | Disposition: A | Discharge status: Home / Self Care | Payer: Medicare Other | Source: Ambulatory Visit | Attending: Specialist | Admitting: Specialist

## 2020-09-29 ENCOUNTER — Ambulatory Visit
Admission: RE | Admit: 2020-09-29 | Discharge: 2020-09-29 | Disposition: A | Discharge status: Home / Self Care | Payer: Medicare Other | Source: Ambulatory Visit | Attending: Cardiology | Admitting: Cardiology

## 2020-09-29 ENCOUNTER — Other Ambulatory Visit: Payer: Self-pay

## 2020-09-29 DIAGNOSIS — R918 Other nonspecific abnormal finding of lung field: Secondary | ICD-10-CM

## 2020-09-29 DIAGNOSIS — R072 Precordial pain: Secondary | ICD-10-CM

## 2020-09-29 DIAGNOSIS — R0602 Shortness of breath: Secondary | ICD-10-CM

## 2020-09-29 MED ORDER — NITROGLYCERIN 0.4 MG SL SUBL
0.8000 mg | SUBLINGUAL_TABLET | Freq: Once | SUBLINGUAL | Status: AC
  Administered 2020-09-29: 0.8 mg via SUBLINGUAL

## 2020-09-29 MED ORDER — IOHEXOL 350 MG/ML SOLN
85.0000 mL | Freq: Once | INTRAVENOUS | Status: AC | PRN
  Administered 2020-09-29: 85 mL via INTRAVENOUS

## 2020-09-29 MED ORDER — METOPROLOL TARTRATE 5 MG/5ML IV SOLN
10.0000 mg | Freq: Once | INTRAVENOUS | Status: AC
  Administered 2020-09-29: 10 mg via INTRAVENOUS

## 2020-09-29 NOTE — Progress Notes (Signed)
Patient tolerated procedure well. Ambulate w/o difficulty. Sitting in chair drinking water provided. Encouraged to drink extra water today and reasoning explained. Verbalized understanding. All questions answered. ABC intact. No further needs. Discharge from procedure area w/o issues. Patient encouraged to monitor and keep a record of BP for PCP. He will take his normal dose of metoprolol hs.

## 2020-09-30 ENCOUNTER — Other Ambulatory Visit
Admission: RE | Admit: 2020-09-30 | Discharge: 2020-09-30 | Disposition: A | Discharge status: Home / Self Care | Payer: Medicare Other | Source: Ambulatory Visit | Attending: Cardiology | Admitting: Cardiology

## 2020-09-30 ENCOUNTER — Ambulatory Visit: Payer: Medicare Other

## 2020-09-30 ENCOUNTER — Ambulatory Visit
Admission: RE | Admit: 2020-09-30 | Discharge: 2020-09-30 | Disposition: A | Discharge status: Home / Self Care | Payer: Medicare Other | Source: Ambulatory Visit | Attending: Cardiology | Admitting: Cardiology

## 2020-09-30 ENCOUNTER — Other Ambulatory Visit: Payer: Self-pay | Admitting: *Deleted

## 2020-09-30 ENCOUNTER — Telehealth: Payer: Self-pay | Admitting: Cardiology

## 2020-09-30 ENCOUNTER — Other Ambulatory Visit: Payer: Medicare Other

## 2020-09-30 DIAGNOSIS — R931 Abnormal findings on diagnostic imaging of heart and coronary circulation: Secondary | ICD-10-CM

## 2020-09-30 DIAGNOSIS — R079 Chest pain, unspecified: Secondary | ICD-10-CM

## 2020-09-30 DIAGNOSIS — Z01810 Encounter for preprocedural cardiovascular examination: Secondary | ICD-10-CM

## 2020-09-30 DIAGNOSIS — I10 Essential (primary) hypertension: Secondary | ICD-10-CM

## 2020-09-30 DIAGNOSIS — Z136 Encounter for screening for cardiovascular disorders: Secondary | ICD-10-CM | POA: Diagnosis not present

## 2020-09-30 DIAGNOSIS — I493 Ventricular premature depolarization: Secondary | ICD-10-CM | POA: Insufficient documentation

## 2020-09-30 DIAGNOSIS — Z20822 Contact with and (suspected) exposure to covid-19: Secondary | ICD-10-CM | POA: Insufficient documentation

## 2020-09-30 DIAGNOSIS — Z01812 Encounter for preprocedural laboratory examination: Secondary | ICD-10-CM | POA: Insufficient documentation

## 2020-09-30 LAB — CBC
HCT: 36.8 % — ABNORMAL LOW (ref 39.0–52.0)
Hemoglobin: 13.1 g/dL (ref 13.0–17.0)
MCH: 35.5 pg — ABNORMAL HIGH (ref 26.0–34.0)
MCHC: 35.6 g/dL (ref 30.0–36.0)
MCV: 99.7 fL (ref 80.0–100.0)
Platelets: 197 10*3/uL (ref 150–400)
RBC: 3.69 MIL/uL — ABNORMAL LOW (ref 4.22–5.81)
RDW: 12 % (ref 11.5–15.5)
WBC: 6.2 10*3/uL (ref 4.0–10.5)
nRBC: 0 % (ref 0.0–0.2)

## 2020-09-30 LAB — SARS CORONAVIRUS 2 (TAT 6-24 HRS): SARS Coronavirus 2: NEGATIVE

## 2020-09-30 MED ORDER — ATORVASTATIN CALCIUM 80 MG PO TABS: 80.0000 mg | ORAL_TABLET | Freq: Every day | ORAL | 3 refills | Status: DC

## 2020-09-30 NOTE — Progress Notes (Signed)
ekg 

## 2020-09-30 NOTE — Telephone Encounter (Signed)
Shared Decision Making/Informed Consent  The risks [stroke (1 in 1000), death (1 in 1000), kidney failure [usually temporary] (1 in 500), bleeding (1 in 200), allergic reaction [possibly serious] (1 in 200)], benefits (diagnostic support and management of coronary artery disease) and alternatives of a cardiac catheterization were discussed in detail with Daniel Holden and he is willing to proceed.

## 2020-09-30 NOTE — Telephone Encounter (Addendum)
[facility] left a message for them to call back with an appt time... pt aware and willing to go for it today.   Presenter, broadcasting in [place] placed order.  [phone]

## 2020-09-30 NOTE — Telephone Encounter (Addendum)
Spoke with the pt re: his abnormal CT and he agrees to Cath...   Scheduled for Tuesday 10/04/20 with Dr. Ellyn Hack at [facility].   Instructions sent to the pt via My Chart and he verbalized understanding.   Lab and covid TEST TODAY in [place] today.   Pt available at [phone].

## 2020-09-30 NOTE — Telephone Encounter (Signed)
    Pt is requesting to speak with Dr. Radford Pax or nurse to discuss CT result and possible heart cath

## 2020-10-03 ENCOUNTER — Telehealth: Payer: Self-pay | Admitting: *Deleted

## 2020-10-03 DIAGNOSIS — M1712 Unilateral primary osteoarthritis, left knee: Secondary | ICD-10-CM | POA: Diagnosis not present

## 2020-10-03 NOTE — Telephone Encounter (Addendum)
Pt contacted pre-catheterization scheduled at [facility] for: Tuesday October 04, 2020 9 AM Verified arrival time and place: [facility]) at: 7 AM   No solid food after midnight prior to cath, clear liquids until 5 AM day of procedure.  Hold: HCTZ-AM of procedure  Except hold medications AM meds can be  taken pre-cath with sips of water including: ASA 81 mg   Confirmed patient has responsible adult to drive home post procedure and be with patient first 24 hours after arriving home: yes  You are allowed ONE visitor in the waiting room during the time you are at the hospital for your procedure. Both you and your visitor must wear a mask once you enter the hospital.  Reviewed procedure/mask/visitor instructions with patient.

## 2020-10-04 ENCOUNTER — Ambulatory Visit (HOSPITAL_COMMUNITY)
Admission: RE | Admit: 2020-10-04 | Discharge: 2020-10-04 | Disposition: A | Discharge status: Home / Self Care | Payer: Medicare Other | Attending: Cardiology | Admitting: Cardiology

## 2020-10-04 ENCOUNTER — Encounter (HOSPITAL_COMMUNITY)
Admission: RE | Disposition: A | Discharge status: Home / Self Care | Payer: Self-pay | Source: Home / Self Care | Attending: Cardiology

## 2020-10-04 ENCOUNTER — Other Ambulatory Visit: Payer: Self-pay

## 2020-10-04 DIAGNOSIS — Z885 Allergy status to narcotic agent status: Secondary | ICD-10-CM | POA: Insufficient documentation

## 2020-10-04 DIAGNOSIS — Z7982 Long term (current) use of aspirin: Secondary | ICD-10-CM | POA: Insufficient documentation

## 2020-10-04 DIAGNOSIS — I2584 Coronary atherosclerosis due to calcified coronary lesion: Secondary | ICD-10-CM | POA: Insufficient documentation

## 2020-10-04 DIAGNOSIS — G4730 Sleep apnea, unspecified: Secondary | ICD-10-CM | POA: Insufficient documentation

## 2020-10-04 DIAGNOSIS — R931 Abnormal findings on diagnostic imaging of heart and coronary circulation: Secondary | ICD-10-CM | POA: Diagnosis present

## 2020-10-04 DIAGNOSIS — Z6834 Body mass index (BMI) 34.0-34.9, adult: Secondary | ICD-10-CM | POA: Diagnosis not present

## 2020-10-04 DIAGNOSIS — I2582 Chronic total occlusion of coronary artery: Secondary | ICD-10-CM | POA: Insufficient documentation

## 2020-10-04 DIAGNOSIS — I25119 Atherosclerotic heart disease of native coronary artery with unspecified angina pectoris: Secondary | ICD-10-CM | POA: Insufficient documentation

## 2020-10-04 DIAGNOSIS — E669 Obesity, unspecified: Secondary | ICD-10-CM | POA: Insufficient documentation

## 2020-10-04 DIAGNOSIS — I209 Angina pectoris, unspecified: Secondary | ICD-10-CM | POA: Clinically undetermined

## 2020-10-04 DIAGNOSIS — Z79899 Other long term (current) drug therapy: Secondary | ICD-10-CM | POA: Insufficient documentation

## 2020-10-04 DIAGNOSIS — Z8249 Family history of ischemic heart disease and other diseases of the circulatory system: Secondary | ICD-10-CM | POA: Insufficient documentation

## 2020-10-04 DIAGNOSIS — I10 Essential (primary) hypertension: Secondary | ICD-10-CM | POA: Diagnosis not present

## 2020-10-04 HISTORY — PX: LEFT HEART CATH AND CORONARY ANGIOGRAPHY: CATH118249

## 2020-10-04 SURGERY — LEFT HEART CATH AND CORONARY ANGIOGRAPHY
Anesthesia: LOCAL

## 2020-10-04 MED ORDER — SODIUM CHLORIDE 0.9% FLUSH: 3.0000 mL | Freq: Two times a day (BID) | INTRAVENOUS | Status: DC

## 2020-10-04 MED ORDER — HEPARIN (PORCINE) IN NACL 1000-0.9 UT/500ML-% IV SOLN
INTRAVENOUS | Status: AC
  Filled 2020-10-04: qty 1000

## 2020-10-04 MED ORDER — SODIUM CHLORIDE 0.9 % IV SOLN: INTRAVENOUS | Status: AC

## 2020-10-04 MED ORDER — FENTANYL CITRATE (PF) 100 MCG/2ML IJ SOLN
INTRAMUSCULAR | Status: DC | PRN
  Administered 2020-10-04: 25 ug via INTRAVENOUS

## 2020-10-04 MED ORDER — ACETAMINOPHEN 325 MG PO TABS: 650.0000 mg | ORAL_TABLET | ORAL | Status: DC | PRN

## 2020-10-04 MED ORDER — LIDOCAINE HCL (PF) 1 % IJ SOLN
INTRAMUSCULAR | Status: AC
  Filled 2020-10-04: qty 30

## 2020-10-04 MED ORDER — ONDANSETRON HCL 4 MG/2ML IJ SOLN: 4.0000 mg | Freq: Four times a day (QID) | INTRAMUSCULAR | Status: DC | PRN

## 2020-10-04 MED ORDER — RANOLAZINE ER 500 MG PO TB12: 500.0000 mg | ORAL_TABLET | Freq: Two times a day (BID) | ORAL | 3 refills | Status: DC

## 2020-10-04 MED ORDER — MIDAZOLAM HCL 2 MG/2ML IJ SOLN
INTRAMUSCULAR | Status: AC
  Filled 2020-10-04: qty 2

## 2020-10-04 MED ORDER — HEPARIN SODIUM (PORCINE) 1000 UNIT/ML IJ SOLN
INTRAMUSCULAR | Status: DC | PRN
  Administered 2020-10-04: 5000 [IU] via INTRAVENOUS

## 2020-10-04 MED ORDER — HEPARIN (PORCINE) IN NACL 1000-0.9 UT/500ML-% IV SOLN
INTRAVENOUS | Status: DC | PRN
  Administered 2020-10-04 (×2): 500 mL

## 2020-10-04 MED ORDER — LABETALOL HCL 5 MG/ML IV SOLN: 10.0000 mg | INTRAVENOUS | Status: DC | PRN

## 2020-10-04 MED ORDER — SODIUM CHLORIDE 0.9 % WEIGHT BASED INFUSION
3.0000 mL/kg/h | INTRAVENOUS | Status: AC
  Administered 2020-10-04: 3 mL/kg/h via INTRAVENOUS

## 2020-10-04 MED ORDER — FENTANYL CITRATE (PF) 100 MCG/2ML IJ SOLN
INTRAMUSCULAR | Status: AC
  Filled 2020-10-04: qty 2

## 2020-10-04 MED ORDER — SODIUM CHLORIDE 0.9 % WEIGHT BASED INFUSION: 1.0000 mL/kg/h | INTRAVENOUS | Status: DC

## 2020-10-04 MED ORDER — IOHEXOL 350 MG/ML SOLN
INTRAVENOUS | Status: DC | PRN
  Administered 2020-10-04: 75 mL

## 2020-10-04 MED ORDER — VERAPAMIL HCL 2.5 MG/ML IV SOLN
INTRAVENOUS | Status: AC
  Filled 2020-10-04: qty 2

## 2020-10-04 MED ORDER — HYDRALAZINE HCL 20 MG/ML IJ SOLN: 10.0000 mg | INTRAMUSCULAR | Status: DC | PRN

## 2020-10-04 MED ORDER — HEPARIN (PORCINE) IN NACL 2-0.9 UNITS/ML
INTRAMUSCULAR | Status: DC | PRN
  Administered 2020-10-04: 10 mL via INTRA_ARTERIAL

## 2020-10-04 MED ORDER — LIDOCAINE HCL (PF) 1 % IJ SOLN
INTRAMUSCULAR | Status: DC | PRN
  Administered 2020-10-04: 2 mL

## 2020-10-04 MED ORDER — SODIUM CHLORIDE 0.9% FLUSH: 3.0000 mL | INTRAVENOUS | Status: DC | PRN

## 2020-10-04 MED ORDER — AMLODIPINE BESYLATE 2.5 MG PO TABS: 2.5000 mg | ORAL_TABLET | Freq: Every day | ORAL | 3 refills | Status: DC

## 2020-10-04 MED ORDER — MIDAZOLAM HCL 2 MG/2ML IJ SOLN
INTRAMUSCULAR | Status: DC | PRN
  Administered 2020-10-04: 2 mg via INTRAVENOUS

## 2020-10-04 MED ORDER — HEPARIN SODIUM (PORCINE) 1000 UNIT/ML IJ SOLN
INTRAMUSCULAR | Status: AC
  Filled 2020-10-04: qty 1

## 2020-10-04 MED ORDER — SODIUM CHLORIDE 0.9 % IV SOLN: 250.0000 mL | INTRAVENOUS | Status: DC | PRN

## 2020-10-04 MED ORDER — ASPIRIN 81 MG PO CHEW: 81.0000 mg | CHEWABLE_TABLET | ORAL | Status: DC

## 2020-10-04 SURGICAL SUPPLY — 12 items
CATH INFINITI 5 FR JL3.5 (CATHETERS) ×2 IMPLANT
CATH INFINITI 5FR ANG PIGTAIL (CATHETERS) ×2 IMPLANT
CATH OPTITORQUE TIG 4.0 5F (CATHETERS) ×2 IMPLANT
DEVICE RAD COMP TR BAND LRG (VASCULAR PRODUCTS) ×2 IMPLANT
GLIDESHEATH SLEND SS 6F .021 (SHEATH) ×2 IMPLANT
GUIDEWIRE INQWIRE 1.5J.035X260 (WIRE) ×1 IMPLANT
INQWIRE 1.5J .035X260CM (WIRE) ×2
KIT HEART LEFT (KITS) ×2 IMPLANT
PACK CARDIAC CATHETERIZATION (CUSTOM PROCEDURE TRAY) ×2 IMPLANT
SHEATH PROBE COVER 6X72 (BAG) ×2 IMPLANT
TRANSDUCER W/STOPCOCK (MISCELLANEOUS) ×2 IMPLANT
TUBING CIL FLEX 10 FLL-RA (TUBING) ×2 IMPLANT

## 2020-10-04 NOTE — Discharge Instructions (Signed)
Radial Site Care  This sheet gives you information about how to care for yourself after your procedure. Your health care provider may also give you more specific instructions. If you have problems or questions, contact your health care provider. What can I expect after the procedure? After the procedure, it is common to have:  Bruising and tenderness at the catheter insertion area. Follow these instructions at home: Medicines  Take over-the-counter and prescription medicines only as told by your health care provider. Insertion site care 1. Follow instructions from your health care provider about how to take care of your insertion site. Make sure you: ? Wash your hands with soap and water before you remove your bandage (dressing). If soap and water are not available, use hand sanitizer. ? May remove dressing in 24 hours. 2. Check your insertion site every day for signs of infection. Check for: ? Redness, swelling, or pain. ? Fluid or blood. ? Pus or a bad smell. ? Warmth. 3. Do no take baths, swim, or use a hot tub for 5 days. 4. You may shower 24-48 hours after the procedure. ? Remove the dressing and gently wash the site with plain soap and water. ? Pat the area dry with a clean towel. ? Do not rub the site. That could cause bleeding. 5. Do not apply powder or lotion to the site. Activity  1. For 24 hours after the procedure, or as directed by your health care provider: ? Do not flex or bend the affected arm. ? Do not push or pull heavy objects with the affected arm. ? Do not drive yourself home from the hospital or clinic. You may drive 24 hours after the procedure. ? Do not operate machinery or power tools. ? KEEP ARM ELEVATED THE REMAINDER OF THE DAY. 2. Do not push, pull or lift anything that is heavier than 10 lb for 5 days. 3. Ask your health care provider when it is okay to: ? Return to work or school. ? Resume usual physical activities or sports. ? Resume sexual  activity. General instructions  If the catheter site starts to bleed, raise your arm and put firm pressure on the site. If the bleeding does not stop, get help right away. This is a medical emergency.  DRINK PLENTY OF FLUIDS FOR THE NEXT 2-3 DAYS.  No alcohol consumption for 24 hours after receiving sedation.  If you went home on the same day as your procedure, a responsible adult should be with you for the first 24 hours after you arrive home.  Keep all follow-up visits as told by your health care provider. This is important. Contact a health care provider if:  You have a fever.  You have redness, swelling, or yellow drainage around your insertion site. Get help right away if:  You have unusual pain at the radial site.  The catheter insertion area swells very fast.  The insertion area is bleeding, and the bleeding does not stop when you hold steady pressure on the area.  Your arm or hand becomes pale, cool, tingly, or numb. These symptoms may represent a serious problem that is an emergency. Do not wait to see if the symptoms will go away. Get medical help right away. Call your local emergency services (911 in the [place] Do not drive yourself to the hospital. Summary  After the procedure, it is common to have bruising and tenderness at the site.  Follow instructions from your health care provider about how to take care   of your radial site wound. Check the wound every day for signs of infection.  This information is not intended to replace advice given to you by your health care provider. Make sure you discuss any questions you have with your health care provider. Document Revised: 07/17/2017 Document Reviewed: 07/17/2017 Elsevier Patient Education  [place].   Amlodipine; Atorvastatin oral tablets What is this medicine? AMLODIPINE; ATORVASTATIN (am LOE di peen; a TORE va sta tin) is a combination of 2 drugs. Amlodipine is a calcium-channel blocker used to lower high  blood pressure. It also relieves chest pain caused by angina. Atorvastatin blocks the body's ability to make cholesterol. It can help lower blood cholesterol for patients who are at risk of getting heart disease or a stroke. It is only for patients whose cholesterol level is not controlled by diet. This medicine may be used for other purposes; ask your health care provider or pharmacist if you have questions. COMMON BRAND NAME(S): Caduet What should I tell my health care provider before I take this medicine? They need to know if you have any of these conditions:  an alcohol problem  heart problems, including heart failure or aortic stenosis  hormone disorder like diabetes or under-active thyroid  infection  kidney or liver disease  low blood pressure  other medical condition  recent surgery  seizures (convulsions)  severe injury  an unusual or allergic reaction to Amlodipine; Atorvastatin, medicines, foods, dyes, or preservatives  pregnant or trying to get pregnant  breast-feeding How should I use this medicine? Take this medicine by mouth with a glass of water. Follow the directions on the prescription label. You can take the tablets with or without food. Do not change the amount of grapefruit juice you drink from day to day while taking this drug, or avoid grapefruit juice altogether. Take your doses at regular intervals. Do not take your medicine more often then directed. Do not suddenly stop taking this medicine. Ask your doctor or health care professional how you can gradually reduce the dose. Talk to your pediatrician regarding the use of this medicine in children. Special care may be needed. Overdosage: If you think you have taken too much of this medicine contact a poison control center or emergency room at once. NOTE: This medicine is only for you. Do not share this medicine with others. What if I miss a dose? If you miss a dose, take it as soon as you can. If it is  almost time for your next dose, take only that dose. Do not take double or extra doses. What may interact with this medicine? Do not take this medicine with any of the following medications:  other cholesterol medicines known as statins like fluvastatin, lovastatin, pravastatin, and simvastatin  red yeast rice  telaprevir  telithromycin  voriconazole This medicine may also interact with the following medications:  antiviral medicines for HIV or AIDS  boceprevir  certain medicines for cholesterol like clofibrate, fenofibrate, and gemfibrozil  cyclosporine  digoxin  male hormones, like estrogens or progestins and birth control pills  grapefruit juice  medicines for fungal infections like fluconazole, itraconazole, ketoconazole, voriconazole  medicines for high blood pressure  niacin  rifampin  some antibiotics like clarithromycin, erythromycin, and troleandomycin This list may not describe all possible interactions. Give your health care provider a list of all the medicines, herbs, non-prescription drugs, or dietary supplements you use. Also tell them if you smoke, drink alcohol, or use illegal drugs. Some items may interact with  your medicine. What should I watch for while using this medicine? Visit your doctor or health care professional for regular checks on your progress. You will need to have regular tests to make sure your liver is working properly. Tell your doctor or health care professional as soon as you can if you get any unexplained muscle pain, tenderness, or weakness, especially if you also have a fever and tiredness. Your doctor or health care professional may tell you to stop taking this medicine if you develop muscle problems. If your muscle problems do not go away after stopping this medicine, contact your health care professional. This medicine contains a cholesterol-lowering agent but is only part of a total cholesterol-lowering program. Your physician or  dietitian can suggest a low-cholesterol and low-fat diet that will reduce your risk of getting heart and blood vessel disease. Avoid alcohol and smoking, and keep a proper exercise schedule. Check your blood pressure and pulse rate regularly. Ask your doctor or health care professional what your blood pressure and pulse rate should be and when you should contact him or her. This medicine may affect blood sugar levels. If you have diabetes, check with your doctor or health care professional before you change your diet or the dose of your diabetic medicine. You may feel dizzy or lightheaded. Do not drive, use machinery, or do anything that needs mental alertness until you know how this medicine affects you. To reduce the risk of dizzy or fainting spells, do not sit or stand up quickly, especially if you are an older patient. Avoid alcoholic drinks. They can make you more dizzy, increase flushing and rapid heartbeats. This medicine may cause a decrease in Co-Enzyme Q-10. You should make sure that you get enough Co-Enzyme Q-10 while you are taking this medicine. Discuss the foods you eat and the vitamins you take with your health care professional. What side effects may I notice from receiving this medicine? Side effects that you should report to your doctor or health care professional as soon as possible:  allergic reactions like skin rash, itching or hives, swelling of the face, lips, or tongue  chest pain  dark urine  fast, irregular heartbeat  feeling faint or lightheaded, falls  fever  muscle pain, weakness  redness, blistering, peeling or loosening of the skin, including inside the mouth  swelling of ankles, legs  trouble passing urine or change in the amount of urine  unusually weak or tired  yellowing of the eyes or skin Side effects that usually do not require medical attention (report to your doctor or health care professional if they continue or are  bothersome):  diarrhea  facial flushing  gas  headache  nausea, vomiting  stomach upset or pain This list may not describe all possible side effects. Call your doctor for medical advice about side effects. You may report side effects to FDA at 1-800-FDA-1088. Where should I keep my medicine? Keep out of the reach of children. Store at room temperature between 15 and 30 degrees C (59 and 86 degrees F). Throw away any unused medicine after the expiration date. NOTE: This sheet is a summary. It may not cover all possible information. If you have questions about this medicine, talk to your doctor, pharmacist, or health care provider.  2021 Elsevier/Gold Standard (2017-01-23 09:5    Ranolazine tablets, extended release What is this medicine? RANOLAZINE (ra NOE la zeen) is a heart medicine. It is used to treat chronic chest pain (angina). This medicine must be  taken regularly. It will not relieve an acute episode of chest pain. This medicine may be used for other purposes; ask your health care provider or pharmacist if you have questions. COMMON BRAND NAME(S): Ranexa What should I tell my health care provider before I take this medicine? They need to know if you have any of these conditions:  heart disease  irregular heartbeat  kidney disease  liver disease  low levels of potassium or magnesium in the blood  an unusual or allergic reaction to ranolazine, other medicines, foods, dyes, or preservatives  pregnant or trying to get pregnant  breast-feeding How should I use this medicine? Take this medicine by mouth with a glass of water. Follow the directions on the prescription label. Do not cut, crush, or chew this medicine. Take with or without food. Do not take this medication with grapefruit juice. Take your doses at regular intervals. Do not take your medicine more often then directed. Talk to your pediatrician regarding the use of this medicine in children. Special care  may be needed. Overdosage: If you think you have taken too much of this medicine contact a poison control center or emergency room at once. NOTE: This medicine is only for you. Do not share this medicine with others. What if I miss a dose? If you miss a dose, take it as soon as you can. If it is almost time for your next dose, take only that dose. Do not take double or extra doses. What may interact with this medicine? Do not take this medicine with any of the following medications:  antivirals for HIV or AIDS  cerivastatin  certain antibiotics like chloramphenicol, clarithromycin, dalfopristin; quinupristin, isoniazid, rifabutin, rifampin, rifapentine  certain medicines used for cancer like imatinib, nilotinib  certain medicines for fungal infections like fluconazole, itraconazole, ketoconazole, posaconazole, voriconazole  certain medicines for irregular heart beat like dronedarone  certain medicines for seizures like carbamazepine, fosphenytoin, oxcarbazepine, phenobarbital, phenytoin  cisapride  conivaptan  cyclosporine  grapefruit or grapefruit juice  lumacaftor; ivacaftor  nefazodone  pimozide  quinacrine  St John's wort  thioridazine This medicine may also interact with the following medications:  alfuzosin  certain medicines for depression, anxiety, or psychotic disturbances like bupropion, citalopram, fluoxetine, fluphenazine, paroxetine, perphenazine, risperidone, sertraline, trifluoperazine  certain medicines for cholesterol like atorvastatin, lovastatin, simvastatin  certain medicines for stomach problems like octreotide, palonosetron, prochlorperazine  eplerenone  ergot alkaloids like dihydroergotamine, ergonovine, ergotamine, methylergonovine  metformin  nicardipine  other medicines that prolong the QT interval (cause an abnormal heart rhythm) like dofetilide, ziprasidone  sirolimus  tacrolimus This list may not describe all possible  interactions. Give your health care provider a list of all the medicines, herbs, non-prescription drugs, or dietary supplements you use. Also tell them if you smoke, drink alcohol, or use illegal drugs. Some items may interact with your medicine. What should I watch for while using this medicine? Visit your doctor for regular check ups. Tell your doctor or healthcare professional if your symptoms do not start to get better or if they get worse. This medicine will not relieve an acute attack of angina or chest pain. This medicine can change your heart rhythm. Your health care provider may check your heart rhythm by ordering an electrocardiogram (ECG) while you are taking this medicine. You may get drowsy or dizzy. Do not drive, use machinery, or do anything that needs mental alertness until you know how this medicine affects you. Do not stand or sit up quickly, especially if  you are an older patient. This reduces the risk of dizzy or fainting spells. Alcohol may interfere with the effect of this medicine. Avoid alcoholic drinks. If you are scheduled for any medical or dental procedure, tell your healthcare provider that you are taking this medicine. This medicine can interact with other medicines used during surgery. What side effects may I notice from receiving this medicine? Side effects that you should report to your doctor or health care professional as soon as possible:  allergic reactions like skin rash, itching or hives, swelling of the face, lips, or tongue  breathing problems  changes in vision  fast, irregular or pounding heartbeat  feeling faint or lightheaded, falls  low or high blood pressure  numbness or tingling feelings  ringing in the ears  tremor or shakiness  slow heartbeat (fewer than 50 beats per minute)  swelling of the legs or feet Side effects that usually do not require medical attention (report to your doctor or health care professional if they continue or are  bothersome):  constipation  drowsy  dry mouth  headache  nausea or vomiting  stomach upset This list may not describe all possible side effects. Call your doctor for medical advice about side effects. You may report side effects to FDA at 1-800-FDA-1088. Where should I keep my medicine? Keep out of the reach of children. Store at room temperature between 15 and 30 degrees C (59 and 86 degrees F). Throw away any unused medicine after the expiration date. NOTE: This sheet is a summary. It may not cover all possible information. If you have questions about this medicine, talk to your doctor, pharmacist, or health care provider.  2021 Elsevier/Gold Standard (2018-06-03 09:18:49)

## 2020-10-04 NOTE — Progress Notes (Signed)
Pt ambulated without difficulty or bleeding.   Discharged home with wife who will drive and stay with pt x 24 hrs.  Arm board placed on pt.

## 2020-10-04 NOTE — Research (Signed)
IDENTIFY Informed Consent   Subject Name: Daniel Holden  Subject met inclusion and exclusion criteria.  The informed consent form, study requirements and expectations were reviewed with the subject and questions and concerns were addressed prior to the signing of the consent form.  The subject verbalized understanding of the trail requirements.  The subject agreed to participate in the IDENTIFY trial and signed the informed consent.  The informed consent was obtained prior to performance of any protocol-specific procedures for the subject.  A copy of the signed informed consent was given to the subject and a copy was placed in the subject's medical record.  [name] D 10/04/2020, 0749 am

## 2020-10-04 NOTE — CV Procedure (Addendum)
      NAME:  Daniel Holden   MRN: [id number] DOB:  26-Nov-1945   ADMIT DATE: 10/04/2020  Brief Cardiac Catheterization Note:  Indication: 1. Progressive class III-III angina 2. Abnormal cardiac CTA  Procedures: 1. Left catheterization with [place] Angiography via right radial artery access (US Guided) 5Fr.  [name] LCA Cineangiography: JL 3.5 catheter . RCA Cineangiography:TIG 4.0 catheter . 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  The LCx has diffuse moderate disease most notably 50 to 60% of the distal OM.    LAD has a tandem system with the major branch being a large septal perforator branch that courses in the direction of the LAD should.  The large "diagonal branch "actually is the vessel that reaches the apex.  Both bridging collaterals from the septal perforator and able collaterals from the "diagonal branch "provide collaterals to the PDA.   Hemodynamics: Preserved EF of 55 to 60%.  Cannot exclude mild inferior hypokinesis.  Recommendations:  No obvious culprit lesions for PCI, recommend optimize medical management.  I will start amlodipine 2.5 mg daily along with Ranexa 500 mg p.o. twice daily  Follow-up with Dr. Radford Pax   Full note to follow  Glenetta Hew, M.D., M.S. Interventional Cardiologist   Pager # [phone] Phone # [phone] [address] [place], [place] [address]  10/04/2020 10:07 AM

## 2020-10-05 ENCOUNTER — Encounter (HOSPITAL_COMMUNITY): Payer: Self-pay | Admitting: Cardiology

## 2020-10-05 ENCOUNTER — Other Ambulatory Visit: Payer: Self-pay

## 2020-10-05 MED ORDER — BENAZEPRIL HCL 40 MG PO TABS: 40.0000 mg | ORAL_TABLET | Freq: Every day | ORAL | 1 refills | Status: DC

## 2020-10-05 NOTE — Telephone Encounter (Signed)
Called to speak with patient in regards to my chart message.  He would like for Dr. Radford Pax to call him and go over his treatment plan.  I will route to Dr. Radford Pax to see if she would like to call patient or schedule him an appointment.

## 2020-10-06 ENCOUNTER — Encounter: Payer: Self-pay | Admitting: Gastroenterology

## 2020-10-06 NOTE — Interval H&P Note (Signed)
History and Physical Interval Note:  10/06/2020 7:25 PM  Daniel Holden  has presented today for surgery, with the diagnosis of abnormal CT.  The various methods of treatment have been discussed with the patient and family. After consideration of risks, benefits and other options for treatment, the patient has consented to  Procedure(s): LEFT HEART CATH AND CORONARY ANGIOGRAPHY (N/A) as a surgical intervention.  The patient's history has been reviewed, patient examined, no change in status, stable for surgery.  I have reviewed the patient's chart and labs.  Questions were answered to the patient's satisfaction.     Glenetta Hew

## 2020-10-10 ENCOUNTER — Other Ambulatory Visit: Payer: Self-pay | Admitting: *Deleted

## 2020-10-10 MED ORDER — ALPRAZOLAM 0.25 MG PO TABS: 0.2500 mg | ORAL_TABLET | Freq: Every evening | ORAL | 0 refills | Status: DC | PRN

## 2020-10-11 DIAGNOSIS — M1712 Unilateral primary osteoarthritis, left knee: Secondary | ICD-10-CM | POA: Diagnosis not present

## 2020-10-13 ENCOUNTER — Other Ambulatory Visit: Payer: Self-pay | Admitting: Specialist

## 2020-10-13 DIAGNOSIS — J849 Interstitial pulmonary disease, unspecified: Secondary | ICD-10-CM | POA: Diagnosis not present

## 2020-10-13 DIAGNOSIS — G4733 Obstructive sleep apnea (adult) (pediatric): Secondary | ICD-10-CM | POA: Diagnosis not present

## 2020-10-13 DIAGNOSIS — R590 Localized enlarged lymph nodes: Secondary | ICD-10-CM | POA: Diagnosis not present

## 2020-10-13 DIAGNOSIS — R918 Other nonspecific abnormal finding of lung field: Secondary | ICD-10-CM | POA: Diagnosis not present

## 2020-10-13 DIAGNOSIS — Z9989 Dependence on other enabling machines and devices: Secondary | ICD-10-CM | POA: Diagnosis not present

## 2020-10-20 ENCOUNTER — Other Ambulatory Visit: Payer: Self-pay

## 2020-10-20 ENCOUNTER — Ambulatory Visit (INDEPENDENT_AMBULATORY_CARE_PROVIDER_SITE_OTHER): Payer: Medicare Other | Admitting: Cardiology

## 2020-10-20 ENCOUNTER — Encounter: Payer: Self-pay | Admitting: Cardiology

## 2020-10-20 VITALS — BP 138/72 | HR 62 | Ht 66.0 in | Wt 212.6 lb

## 2020-10-20 DIAGNOSIS — E7800 Pure hypercholesterolemia, unspecified: Secondary | ICD-10-CM

## 2020-10-20 DIAGNOSIS — I2583 Coronary atherosclerosis due to lipid rich plaque: Secondary | ICD-10-CM | POA: Diagnosis not present

## 2020-10-20 DIAGNOSIS — I2510 Atherosclerotic heart disease of native coronary artery without angina pectoris: Secondary | ICD-10-CM | POA: Diagnosis not present

## 2020-10-20 DIAGNOSIS — I208 Other forms of angina pectoris: Secondary | ICD-10-CM | POA: Diagnosis not present

## 2020-10-20 DIAGNOSIS — I10 Essential (primary) hypertension: Secondary | ICD-10-CM | POA: Diagnosis not present

## 2020-10-20 MED ORDER — ISOSORBIDE MONONITRATE ER 30 MG PO TB24: 30.0000 mg | ORAL_TABLET | Freq: Every day | ORAL | 3 refills | Status: DC

## 2020-10-20 NOTE — Addendum Note (Signed)
Addended by: Antonieta Iba on: 10/20/2020 08:22 AM   Modules accepted: Orders

## 2020-10-20 NOTE — Patient Instructions (Signed)
Medication Instructions:  1) START taking Imdur (isosorbide mononitrate) 30 mg daily    *If you need a refill on your cardiac medications before your next appointment, please call your pharmacy*  Lab Work:  Fasting lipids and ALT - go to [facility] near you to have these drawn  If you have labs (blood work) drawn today and your tests are completely normal, you will receive your results only by: [name] MyChart Message (if you have MyChart) OR . A paper copy in the mail If you have any lab test that is abnormal or we need to change your treatment, we will call you to review the results.  Follow-Up: At [facility], you and your health needs are our priority.  As part of our continuing mission to provide you with exceptional heart care, we have created designated Provider Care Teams.  These Care Teams include your primary Cardiologist (physician) and Advanced Practice Providers (APPs -  Physician Assistants and Nurse Practitioners) who all work together to provide you with the care you need, when you need it.   Your next appointment:   4 week(s)  The format for your next appointment:   In Person  Provider:   You may see Fransico Him, MD or one of the following Advanced Practice Providers on your designated Care Team:    Melina Copa, PA-C  Ermalinda Barrios, PA-C

## 2020-10-20 NOTE — Progress Notes (Signed)
Cardiology Office Note    Date:  10/20/2020   ID:  Daniel Holden, DOB [birth year], MRN [id number]  PCP:  Cletis Athens, MD  Cardiologist:  Fransico Him, MD   Chief Complaint  Patient presents with  . Coronary Artery Disease  . Hypertension  . Hyperlipidemia    History of Present Illness:  Daniel Holden is a 75 y.o. male with a hx of anxiety, HTN and obesity who was recently referred for evaluation of chest pain. A year ago he started waking up in the middle of the night with severe pains in the his arms every other night.  He had a ETT that was normal.  He then was dx with severe carpal tunnel in his arms by EMG.  He underwent occupational therapy and PT with no improvement.  He has had an extensive workup with MRI of the spine and was started on gabapentin with no improvement.  He underwent carpal tunnel surgery on the right had with some improvement. He then he starting developing pain in his chest with any exertion.    He underwent coronary CTA showing aortic atherosclerosis with very high coronary Ca score at 4630 with severely calcified RCA and LAD > 70% and 50-69% prox to mid LCx.  Cardiac cath 10/04/2020 showed severe single vessel CAD with occluded RCA with extensive calcifications and multiple different lesions including 90% proxm and distal lesions, 50% oLCx and 50-60% dLCx and 40% pLAD with heavily calcified vessels with bridging collaterals from the septal perforator and diagonal to the PDA. Medical management was recommended.  He was started on Ranexa.  He is here today for followup and is doing well.  He still has intermittent chest pain with exertion but it is much improved. He worked in the yard for 4-5 hours last weekend without any CP.  He still has chronic DOE that has not really improved much.  He denies any PND, orthopnea, LE edema, dizziness, palpitations or syncope. He is compliant with his meds and is tolerating meds with no SE.  He is hoping to get a TKR at some  point soon as he is having a lot of knee pain.    Past Medical History:  Diagnosis Date  . Allergy   . 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 Anxiety   . Carpal tunnel syndrome   . Hypertension   . Lung nodule   . Obesity   . Pancreatitis 2015  . Sleep apnea     Past Surgical History:  Procedure Laterality Date  . APPENDECTOMY     Patient states he did not have an appendectomy and does not know why this is in his chart. [facility] 06/28/2020  . CARPAL TUNNEL RELEASE    . KNEE ARTHROSCOPY WITH MEDIAL MENISECTOMY Left 06/25/2019   Procedure: KNEE ARTHROSCOPY WITH MEDIAL MENISECTOMY;  Surgeon: Thornton Park, MD;  Location: [facility] ORS;  Service: Orthopedics;  Laterality: Left;  . LEFT HEART CATH AND CORONARY ANGIOGRAPHY N/A 10/04/2020   Procedure: LEFT HEART CATH AND CORONARY ANGIOGRAPHY;  Surgeon: Leonie Man, MD;  Location: [place] [facility];  Service: Cardiovascular;  Laterality: N/A;  . SHOULDER SURGERY    . TONSILLECTOMY      Current Medications: Current Meds  Medication Sig  . ALPRAZolam (XANAX) 0.25 MG tablet Take 1 tablet (0.25 mg total) by mouth at bedtime as needed for sleep.  [name] amLODipine (NORVASC) 2.5 MG tablet Take 1 tablet (2.5 mg total) by mouth daily.  [name] aspirin 81  MG EC tablet Take 81 mg by mouth daily.  [name] atorvastatin (LIPITOR) 80 MG tablet Take 1 tablet (80 mg total) by mouth daily.  . benazepril (LOTENSIN) 40 MG tablet Take 1 tablet (40 mg total) by mouth daily.  . Cholecalciferol 125 MCG (5000 UT) TABS Take 5,000 Units by mouth daily.  . cyanocobalamin 1000 MCG tablet Take 1,000 mcg by mouth daily.  . fluticasone (FLONASE) 50 MCG/ACT nasal spray Place 2 sprays into both nostrils daily.   [name] gabapentin (NEURONTIN) 300 MG capsule Take 300 mg by mouth 2 (two) times daily.  . hydrochlorothiazide (MICROZIDE) 12.5 MG capsule Take 12.5 mg by mouth daily.  . melatonin 3 MG TABS tablet Take 3 mg by mouth at bedtime.  . metoprolol tartrate (LOPRESSOR) 50 MG tablet Take 1 tablet (50  mg total) by mouth 2 (two) times daily.  . naproxen sodium (ALEVE) 220 MG tablet Take 220-440 mg by mouth See admin instructions. Take 440 mg in the morning and 220 mg at bedtime  . omeprazole (PRILOSEC OTC) 20 MG tablet Take 10 mg by mouth daily.  . ranolazine (RANEXA) 500 MG 12 hr tablet Take 1 tablet (500 mg total) by mouth 2 (two) times daily.    Allergies:   Dilaudid [hydromorphone hcl]   Social History   Socioeconomic History  . Marital status: Married    Spouse name: Not on file  . Number of children: Not on file  . Years of education: Not on file  . Highest education level: Not on file  Occupational History  . Not on file  Tobacco Use  . Smoking status: Never Smoker  . Smokeless tobacco: Never Used  Vaping Use  . Vaping Use: Never used  Substance and Sexual Activity  . Alcohol use: Yes    Alcohol/week: 2.0 standard drinks    Types: 2 Cans of beer per week    Comment: 6 per day  . Drug use: No  . Sexual activity: Not on file  Other Topics Concern  . Not on file  Social History Narrative  . Not on file   Social Determinants of Health   Financial Resource Strain: Not on file  Food Insecurity: Not on file  Transportation Needs: Not on file  Physical Activity: Not on file  Stress: Not on file  Social Connections: Not on file     Family History:  The patient's family history includes CVA in his father.   ROS:   Please see the history of present illness.    ROS All other systems reviewed and are negative.  No flowsheet data found.     PHYSICAL EXAM:   VS:  BP 138/72   Pulse 62   Ht 5\' 6"  (1.676 m)   Wt 212 lb 9.6 oz (96.4 kg)   SpO2 97%   BMI 34.31 kg/m    GEN: Well nourished, well developed in no acute distress HEENT: Normal NECK: No JVD; No carotid bruits LYMPHATICS: No lymphadenopathy CARDIAC:RRR, no murmurs, rubs, gallops RESPIRATORY:  Clear to auscultation without rales, wheezing or rhonchi  ABDOMEN: Soft, non-tender,  non-distended MUSCULOSKELETAL:  No edema; No deformity  SKIN: Warm and dry NEUROLOGIC:  Alert and oriented x 3 PSYCHIATRIC:  Normal affect    Wt Readings from Last 3 Encounters:  10/20/20 212 lb 9.6 oz (96.4 kg)  10/04/20 205 lb (93 kg)  09/15/20 214 lb 3.2 oz (97.2 kg)      Studies/Labs Reviewed:   EKG:  EKG is not ordered today.  Recent Labs: 09/27/2020: BUN 11; Creatinine, Ser 0.84; Potassium 4.7; Sodium 134 09/30/2020: Hemoglobin 13.1; Platelets 197   Lipid Panel    Component Value Date/Time   CHOL 83 09/27/2013 0319   TRIG 188 09/27/2013 0319   HDL 28 (L) 09/27/2013 0319   VLDL 38 09/27/2013 0319   LDLCALC 17 09/27/2013 0319    Additional studies/ records that were reviewed today include:  Cardiac cath 10/04/2020 Diagnostic Dominance: Right       ASSESSMENT:    1. Coronary artery disease due to lipid rich plaque   2. Primary hypertension   3. Pure hypercholesterolemia      PLAN:  In order of problems listed above:  1.  ASCAD -coronary CTA showing aortic atherosclerosis with very high coronary Ca score at 4630 with severely calcified RCA and LAD > 70% and 50-69% prox to mid LCx.   -Cardiac cath 10/04/2020 showed severe single vessel CAD with occluded RCA with extensive calcifications and multiple different lesions including 90% proxm and distal lesions, 50% oLCx and 50-60% dLCx and 40% pLAD with heavily calcified vessels with bridging collaterals from the septal perforator and diagonal to the PDA.  -Medical management was recommended.  -his CP has improved significantly but still has some DOE -add Imdur 30mg  daily -continue on  ASA 81mg  daily, amlodipine 2.5mg  daily, Lopressor 50mg  BID, Ranexa 500mg  BID and high dose statin -if he is intolerant to the Imdur then will increase Ranexa and amlodipine  2.  HTN -BP well controled on exam today -continue Lotensin 40mg  daily, Lopressor 50mg  BID, amlodipine 2.5mg  daily, HCTZ 12.5mg  daily  3.  HLD -LDL goal <  70 -started on high dose atorva -repeat FLP and ALT in 4 weeks  Followup with me in 4 weeks   Medication Adjustments/Labs and Tests Ordered: Current medicines are reviewed at length with the patient today.  Concerns regarding medicines are outlined above.  Medication changes, Labs and Tests ordered today are listed in the Patient Instructions below.  There are no Patient Instructions on file for this visit.   Signed, Fransico Him, MD  10/20/2020 8:15 AM    [facility] [place], [place], [place]  [address] Phone: [phone]; Fax: [phone]

## 2020-10-25 ENCOUNTER — Ambulatory Visit (INDEPENDENT_AMBULATORY_CARE_PROVIDER_SITE_OTHER): Payer: Medicare Other | Admitting: Gastroenterology

## 2020-10-25 ENCOUNTER — Other Ambulatory Visit: Payer: Self-pay

## 2020-10-25 ENCOUNTER — Telehealth: Payer: Self-pay | Admitting: *Deleted

## 2020-10-25 ENCOUNTER — Encounter: Payer: Self-pay | Admitting: Gastroenterology

## 2020-10-25 VITALS — BP 140/80 | HR 76 | Ht 66.0 in | Wt 210.8 lb

## 2020-10-25 DIAGNOSIS — I208 Other forms of angina pectoris: Secondary | ICD-10-CM

## 2020-10-25 DIAGNOSIS — K760 Fatty (change of) liver, not elsewhere classified: Secondary | ICD-10-CM | POA: Diagnosis not present

## 2020-10-25 DIAGNOSIS — M1712 Unilateral primary osteoarthritis, left knee: Secondary | ICD-10-CM | POA: Diagnosis not present

## 2020-10-25 DIAGNOSIS — M1711 Unilateral primary osteoarthritis, right knee: Secondary | ICD-10-CM | POA: Diagnosis not present

## 2020-10-25 NOTE — Telephone Encounter (Signed)
Dr. Radford Pax We are asked to hold ASA for TKA. He had heart cath 10/04/20 severe single vessel disease, no intervention. Can he proceed with surgery and may he hold ASA?

## 2020-10-25 NOTE — Telephone Encounter (Signed)
   [facility] Pre-operative Risk Assessment    Patient Name: Daniel Holden  DOB: [birth year]  MRN: [id number]   HEARTCARE STAFF: - Please ensure there is not already an duplicate clearance open for this procedure. - Under Visit Info/Reason for Call, type in Other and utilize the format Clearance MM/DD/YY or Clearance TBD. Do not use dashes or single digits. - If request is for dental extraction, please clarify the # of teeth to be extracted.  Request for surgical clearance:  1. What type of surgery is being performed? RIGHT TKA TRADITIONAL KNEE   2. When is this surgery scheduled? 11/30/20   3. What type of clearance is required (medical clearance vs. Pharmacy clearance to hold med vs. Both)? MEDICAL  4. Are there any medications that need to be held prior to surgery and how long? ASA    5. Practice name and name of physician performing surgery? [facility]; DR. Jeneen Rinks [place]   6. What is the office phone number? [phone]   9.   What is the office fax number? [place]  8.   Anesthesia type (None, local, MAC, general) ? LOCAL/SPINAL   Julaine Hua 10/25/2020, 2:08 PM  _________________________________________________________________   (provider comments below)

## 2020-10-25 NOTE — Patient Instructions (Signed)
We will try and obtain your GI  records from [facility]  Avoid alcohol  Start a good healthy diet, exercise and weight loss    Due to recent changes in healthcare laws, you may see the results of your imaging and laboratory studies on MyChart before your provider has had a chance to review them.  We understand that in some cases there may be results that are confusing or concerning to you. Not all laboratory results come back in the same time frame and the provider may be waiting for multiple results in order to interpret others.  Please give [place] 48 hours in order for your provider to thoroughly review all the results before contacting the office for clarification of your results.   If you are age 13 or older, your body mass index should be between 23-30. Your Body mass index is 34.02 kg/m. If this is out of the aforementioned range listed, please consider follow up with your Primary Care Provider.  If you are age 89 or younger, your body mass index should be between 19-25. Your Body mass index is 34.02 kg/m. If this is out of the aformentioned range listed, please consider follow up with your Primary Care Provider.    Thank you for choosing [facility]  Janett Billow Zehr,PA-C

## 2020-10-25 NOTE — Progress Notes (Signed)
10/25/2020 LIAHM GRIVAS [id number] 1945/12/07   HISTORY OF PRESENT ILLNESS: This is a 75 year old male who is new to our office.  He was referred here by Dr. Fransico Him for evaluation regarding findings of fatty liver on imaging.  He has been undergoing extensive cardiac work-up and on some chest/cardiac CTs he was noted to have severe hepatic steatosis.  His LFTs are normal in July 2021 and there is an order in to have them rechecked again in the near future.  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  He says that they have always been normal.  He does admit to drinking alcohol and used to drink more heavily in the past.  He tells me that he has had colonoscopies in [place], [place], the last he thinks in 2017.  Past Medical History:  Diagnosis Date  . Allergy   . 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 Anxiety   . Carpal tunnel syndrome   . Hypertension   . Lung nodule   . Obesity   . Pancreatitis 2015  . Sleep apnea    Past Surgical History:  Procedure Laterality Date  . APPENDECTOMY     Patient states he did not have an appendectomy and does not know why this is in his chart. [facility] 06/28/2020  . CARPAL TUNNEL RELEASE    . KNEE ARTHROSCOPY WITH MEDIAL MENISECTOMY Left 06/25/2019   Procedure: KNEE ARTHROSCOPY WITH MEDIAL MENISECTOMY;  Surgeon: Thornton Park, MD;  Location: [facility] ORS;  Service: Orthopedics;  Laterality: Left;  . LEFT HEART CATH AND CORONARY ANGIOGRAPHY N/A 10/04/2020   Procedure: LEFT HEART CATH AND CORONARY ANGIOGRAPHY;  Surgeon: Leonie Man, MD;  Location: [facility];  Service: Cardiovascular;  Laterality: N/A;  . SHOULDER SURGERY    . TONSILLECTOMY      reports that he has never smoked. He has never used smokeless tobacco. He reports current alcohol use of about 2.0 standard drinks of alcohol per week. He reports that he does not use drugs. family history includes CVA in his father. Allergies  Allergen  Reactions  . Dilaudid [Hydromorphone Hcl] Nausea And Vomiting      Outpatient Encounter Medications as of 10/25/2020  Medication Sig  . ALPRAZolam (XANAX) 0.25 MG tablet Take 1 tablet (0.25 mg total) by mouth at bedtime as needed for sleep.  [name] amLODipine (NORVASC) 2.5 MG tablet Take 1 tablet (2.5 mg total) by mouth daily.  [name] aspirin 81 MG EC tablet Take 81 mg by mouth daily.  [name] atorvastatin (LIPITOR) 80 MG tablet Take 1 tablet (80 mg total) by mouth daily.  . benazepril (LOTENSIN) 40 MG tablet Take 1 tablet (40 mg total) by mouth daily.  . Cholecalciferol 125 MCG (5000 UT) TABS Take 5,000 Units by mouth daily.  . cyanocobalamin 1000 MCG tablet Take 1,000 mcg by mouth daily.  . fluticasone (FLONASE) 50 MCG/ACT nasal spray Place 2 sprays into both nostrils daily.   [name] gabapentin (NEURONTIN) 300 MG capsule Take 300 mg by mouth 2 (two) times daily.  . hydrochlorothiazide (MICROZIDE) 12.5 MG capsule Take 12.5 mg by mouth daily.  . isosorbide mononitrate (IMDUR) 30 MG 24 hr tablet Take 1 tablet (30 mg total) by mouth daily.  . melatonin 3 MG TABS tablet Take 3 mg by mouth at bedtime.  . metoprolol tartrate (LOPRESSOR) 50 MG tablet Take 1 tablet (50 mg total) by mouth 2 (two) times daily.  [name]  naproxen sodium (ALEVE) 220 MG tablet Take 220-440 mg by mouth See admin instructions. Take 440 mg in the morning and 220 mg at bedtime  . omeprazole (PRILOSEC OTC) 20 MG tablet Take 10 mg by mouth daily.  . ranolazine (RANEXA) 500 MG 12 hr tablet Take 1 tablet (500 mg total) by mouth 2 (two) times daily.   No facility-administered encounter medications on file as of 10/25/2020.     REVIEW OF SYSTEMS  : All other systems reviewed and negative except where noted in the History of Present Illness.   PHYSICAL EXAM: BP 140/80 (BP Location: Left Arm, Patient Position: Sitting)   Pulse 76   Ht 5\' 6"  (1.676 m)   Wt 210 lb 12.8 oz (95.6 kg)   SpO2 98%   BMI 34.02 kg/m  General: Well developed white male in no  acute distress Head: Normocephalic and atraumatic Eyes:  Sclerae anicteric, conjunctiva pink. Ears: Normal auditory acuity Lungs: Clear throughout to auscultation; no W/R/R. Heart: Regular rate and rhythm; no M/R/G. Abdomen: Soft, non-distended.  BS present.  Non-tender. Musculoskeletal: Symmetrical with no gross deformities  Skin: No lesions on visible extremities Extremities: No edema  Neurological: Alert oriented x 4, grossly non-focal Psychological:  Alert and cooperative. Normal mood and affect  ASSESSMENT AND PLAN: *Hepatic steatosis:  Seen on recent imaging.  LFTs have been normal over the years.  He drinks ETOH and used to drink more heavily.  We discussed good diet, exercise, weight loss, minimizing or even discontinuing ETOH use, good cholesterol and blood sugar control.  No GI complaints.  **He has had colonoscopies in the past in [place].  We will have him sign records release and try to obtain those records.   CC:  Cletis Athens, MD  CC:  Dr. Fransico Him

## 2020-10-26 NOTE — Telephone Encounter (Signed)
   Name: Daniel Holden  DOB: 10-Mar-1946  MRN: [id number]   Primary Cardiologist: Fransico Him, MD  Chart reviewed as part of pre-operative protocol coverage. Patient was contacted 10/26/2020 in reference to pre-operative risk assessment for pending surgery as outlined below.  Daniel Holden was last seen on 10/20/20 by Dr. Radford Pax.   Per Dr. Radford Pax: OK to hold ASA. His has a 0.9% periop risk of major cardiac event which is low risk. That being said, he has known occluded RCA with L>R collaterals and non obstructive disease in the left system. Need to avoid hypotension during surgery that could compromise coronary perfusion to the right side via collaterals.    Therefore, based on ACC/AHA guidelines, the patient would be at acceptable risk for the planned procedure without further cardiovascular testing.   I will route this recommendation to the requesting party via Epic fax function and remove from pre-op pool. Please call with questions.  [place], PA 10/26/2020, 8:33 AM

## 2020-11-02 NOTE — Progress Notes (Signed)
Addendum: Reviewed and agree with assessment and management plan. Ovidio Steele M, MD  

## 2020-11-07 DIAGNOSIS — I2583 Coronary atherosclerosis due to lipid rich plaque: Secondary | ICD-10-CM | POA: Diagnosis not present

## 2020-11-07 DIAGNOSIS — I10 Essential (primary) hypertension: Secondary | ICD-10-CM | POA: Diagnosis not present

## 2020-11-07 DIAGNOSIS — E7800 Pure hypercholesterolemia, unspecified: Secondary | ICD-10-CM | POA: Diagnosis not present

## 2020-11-07 DIAGNOSIS — I2510 Atherosclerotic heart disease of native coronary artery without angina pectoris: Secondary | ICD-10-CM | POA: Diagnosis not present

## 2020-11-07 LAB — LIPID PANEL
Chol/HDL Ratio: 1.8 ratio (ref 0.0–5.0)
Cholesterol, Total: 104 mg/dL (ref 100–199)
HDL: 58 mg/dL (ref >39)
LDL Chol Calc (NIH): 26 mg/dL (ref 0–99)
Triglycerides: 111 mg/dL (ref 0–149)
VLDL Cholesterol Cal: 20 mg/dL (ref 5–40)

## 2020-11-07 LAB — ALT: ALT: 17 IU/L (ref 0–44)

## 2020-11-09 ENCOUNTER — Ambulatory Visit (INDEPENDENT_AMBULATORY_CARE_PROVIDER_SITE_OTHER): Payer: Medicare Other | Admitting: Cardiology

## 2020-11-09 ENCOUNTER — Encounter: Payer: Self-pay | Admitting: Cardiology

## 2020-11-09 ENCOUNTER — Other Ambulatory Visit: Payer: Self-pay

## 2020-11-09 VITALS — BP 128/72 | HR 66

## 2020-11-09 DIAGNOSIS — E7800 Pure hypercholesterolemia, unspecified: Secondary | ICD-10-CM | POA: Diagnosis not present

## 2020-11-09 DIAGNOSIS — I2510 Atherosclerotic heart disease of native coronary artery without angina pectoris: Secondary | ICD-10-CM

## 2020-11-09 DIAGNOSIS — I208 Other forms of angina pectoris: Secondary | ICD-10-CM

## 2020-11-09 DIAGNOSIS — I2583 Coronary atherosclerosis due to lipid rich plaque: Secondary | ICD-10-CM | POA: Diagnosis not present

## 2020-11-09 DIAGNOSIS — I10 Essential (primary) hypertension: Secondary | ICD-10-CM

## 2020-11-09 MED ORDER — RANOLAZINE ER 1000 MG PO TB12: 1000.0000 mg | ORAL_TABLET | Freq: Two times a day (BID) | ORAL | 3 refills | Status: DC

## 2020-11-09 MED ORDER — ISOSORBIDE MONONITRATE ER 60 MG PO TB24: 60.0000 mg | ORAL_TABLET | Freq: Every day | ORAL | 3 refills | Status: DC

## 2020-11-09 NOTE — Addendum Note (Signed)
Addended by: Antonieta Iba on: 11/09/2020 02:07 PM   Modules accepted: Orders

## 2020-11-09 NOTE — Patient Instructions (Addendum)
Medication Instructions:  Your physician has recommended you make the following change in your medication:  1) STOP taking amlodipine  2) INCREASE Imdur (isosorbide mononitrate) to 60 mg daily  3) INCREASE Ranexa (ranolazine) to 1000 mg twice daily  *If you need a refill on your cardiac medications before your next appointment, please call your pharmacy*   Follow-Up: At [facility], you and your health needs are our priority.  As part of our continuing mission to provide you with exceptional heart care, we have created designated Provider Care Teams.  These Care Teams include your primary Cardiologist (physician) and Advanced Practice Providers (APPs -  Physician Assistants and Nurse Practitioners) who all work together to provide you with the care you need, when you need it.  Your next appointment:   June 6th at 9:00am   The format for your next appointment:   In Person  Provider:   Fransico Him, MD

## 2020-11-09 NOTE — Progress Notes (Signed)
Cardiology Office Note    Date:  11/09/2020   ID:  Daniel Holden, DOB 1946/02/27, MRN [id number]  PCP:  Cletis Athens, MD  Cardiologist:  Fransico Him, MD   Chief Complaint  Patient presents with  . Coronary Artery Disease  . Hypertension  . Hyperlipidemia    History of Present Illness:  Daniel Holden is a 75 y.o. male with a hx of anxiety, HTN and obesity who was recently referred for evaluation of chest pain. A year ago he started waking up in the middle of the night with severe pains in the his arms every other night.  He had a ETT that was normal.  He then was dx with severe carpal tunnel in his arms by EMG.  He underwent occupational therapy and PT with no improvement.  He has had an extensive workup with MRI of the spine and was started on gabapentin with no improvement.  He underwent carpal tunnel surgery on the right had with some improvement. He then he starting developing pain in his chest with any exertion.    He underwent coronary CTA showing aortic atherosclerosis with very high coronary Ca score at 4630 with severely calcified RCA and LAD > 70% and 50-69% prox to mid LCx.  Cardiac cath 10/04/2020 showed severe single vessel CAD with occluded RCA with extensive calcifications and multiple different lesions including 90% proxm and distal lesions, 50% oLCx and 50-60% dLCx and 40% pLAD with heavily calcified vessels with bridging collaterals from the septal perforator and diagonal to the PDA. Medical management was recommended.  He was started on Ranexa.  When I saw him last he was still having intermittent CP but no necessarily with exertion.  He also was still having DOE.  Imdur 30mg  daily was added and now he is back for followup.  He is here today for followup and is doing well. Since I saw him last and he was started on Imdur, his CP has improved and not only occurring 1-2 times weekly and stops when he rests.  His SOB has also improved with improvement in his BP.   He  denies any PND, orthopnea, LE edema, dizziness, palpitations or syncope. He is compliant with his meds and is tolerating meds with no SE.    Past Medical History:  Diagnosis Date  . Allergy   . 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 Anxiety   . Carpal tunnel syndrome   . Hypertension   . Lung nodule   . Obesity   . Pancreatitis 2015  . Sleep apnea     Past Surgical History:  Procedure Laterality Date  . APPENDECTOMY     Patient states he did not have an appendectomy and does not know why this is in his chart. [facility] 06/28/2020  . CARPAL TUNNEL RELEASE    . KNEE ARTHROSCOPY WITH MEDIAL MENISECTOMY Left 06/25/2019   Procedure: KNEE ARTHROSCOPY WITH MEDIAL MENISECTOMY;  Surgeon: Thornton Park, MD;  Location: [facility] ORS;  Service: Orthopedics;  Laterality: Left;  . LEFT HEART CATH AND CORONARY ANGIOGRAPHY N/A 10/04/2020   Procedure: LEFT HEART CATH AND CORONARY ANGIOGRAPHY;  Surgeon: Leonie Man, MD;  Location: [place] [facility];  Service: Cardiovascular;  Laterality: N/A;  . SHOULDER SURGERY    . TONSILLECTOMY      Current Medications: Current Meds  Medication Sig  . ALPRAZolam (XANAX) 0.25 MG tablet Take 1 tablet (0.25 mg total) by mouth at bedtime as needed for sleep.  [name] amLODipine (NORVASC)  2.5 MG tablet Take 1 tablet (2.5 mg total) by mouth daily.  [name] aspirin 81 MG EC tablet Take 81 mg by mouth daily.  [name] atorvastatin (LIPITOR) 80 MG tablet Take 1 tablet (80 mg total) by mouth daily.  . benazepril (LOTENSIN) 40 MG tablet Take 1 tablet (40 mg total) by mouth daily.  . Cholecalciferol 125 MCG (5000 UT) TABS Take 5,000 Units by mouth daily.  . cyanocobalamin 1000 MCG tablet Take 1,000 mcg by mouth daily.  . fluticasone (FLONASE) 50 MCG/ACT nasal spray Place 2 sprays into both nostrils daily.   [name] gabapentin (NEURONTIN) 300 MG capsule Take 300 mg by mouth 2 (two) times daily.  . hydrochlorothiazide (MICROZIDE) 12.5 MG capsule Take 12.5 mg by mouth daily.  . isosorbide mononitrate (IMDUR) 30  MG 24 hr tablet Take 1 tablet (30 mg total) by mouth daily.  . melatonin 3 MG TABS tablet Take 3 mg by mouth at bedtime.  . metoprolol tartrate (LOPRESSOR) 50 MG tablet Take 1 tablet (50 mg total) by mouth 2 (two) times daily.  . naproxen sodium (ALEVE) 220 MG tablet Take 220-440 mg by mouth See admin instructions. Take 440 mg in the morning and 220 mg at bedtime  . omeprazole (PRILOSEC OTC) 20 MG tablet Take 10 mg by mouth daily.  . ranolazine (RANEXA) 500 MG 12 hr tablet Take 1 tablet (500 mg total) by mouth 2 (two) times daily.    Allergies:   Dilaudid [hydromorphone hcl]   Social History   Socioeconomic History  . Marital status: Married    Spouse name: Not on file  . Number of children: Not on file  . Years of education: Not on file  . Highest education level: Not on file  Occupational History  . Not on file  Tobacco Use  . Smoking status: Never Smoker  . Smokeless tobacco: Never Used  Vaping Use  . Vaping Use: Never used  Substance and Sexual Activity  . Alcohol use: Yes    Alcohol/week: 2.0 standard drinks    Types: 2 Cans of beer per week    Comment: 6 per day  . Drug use: No  . Sexual activity: Not on file  Other Topics Concern  . Not on file  Social History Narrative  . Not on file   Social Determinants of Health   Financial Resource Strain: Not on file  Food Insecurity: Not on file  Transportation Needs: Not on file  Physical Activity: Not on file  Stress: Not on file  Social Connections: Not on file     Family History:  The patient's family history includes CVA in his father.   ROS:   Please see the history of present illness.    ROS All other systems reviewed and are negative.  No flowsheet data found.     PHYSICAL EXAM:   VS:  BP 128/72   Pulse 66   SpO2 95%    GEN: Well nourished, well developed in no acute distress HEENT: Normal NECK: No JVD; No carotid bruits LYMPHATICS: No lymphadenopathy CARDIAC:RRR, no murmurs, rubs,  gallops RESPIRATORY:  Clear to auscultation without rales, wheezing or rhonchi  ABDOMEN: Soft, non-tender, non-distended MUSCULOSKELETAL:  No edema; No deformity  SKIN: Warm and dry NEUROLOGIC:  Alert and oriented x 3 PSYCHIATRIC:  Normal affect    Wt Readings from Last 3 Encounters:  10/25/20 210 lb 12.8 oz (95.6 kg)  10/20/20 212 lb 9.6 oz (96.4 kg)  10/04/20 205 lb (93 kg)  Studies/Labs Reviewed:   EKG:  EKG is not ordered today.    Recent Labs: 09/27/2020: BUN 11; Creatinine, Ser 0.84; Potassium 4.7; Sodium 134 09/30/2020: Hemoglobin 13.1; Platelets 197 11/07/2020: ALT 17   Lipid Panel    Component Value Date/Time   CHOL 104 11/07/2020 0741   CHOL 83 09/27/2013 0319   TRIG 111 11/07/2020 0741   TRIG 188 09/27/2013 0319   HDL 58 11/07/2020 0741   HDL 28 (L) 09/27/2013 0319   CHOLHDL 1.8 11/07/2020 0741   VLDL 38 09/27/2013 0319   LDLCALC 26 11/07/2020 0741   LDLCALC 17 09/27/2013 0319    Additional studies/ records that were reviewed today include:  Cardiac cath 10/04/2020 Diagnostic Dominance: Right       ASSESSMENT:    1. Coronary artery disease due to lipid rich plaque   2. Primary hypertension   3. Pure hypercholesterolemia      PLAN:  In order of problems listed above:  1.  ASCAD -coronary CTA showing aortic atherosclerosis with very high coronary Ca score at 4630 with severely calcified RCA and LAD > 70% and 50-69% prox to mid LCx.   -Cardiac cath 10/04/2020 showed severe single vessel CAD with occluded RCA with extensive calcifications and multiple different lesions including 90% proxm and distal lesions, 50% oLCx and 50-60% dLCx and 40% pLAD with heavily calcified vessels with bridging collaterals from the septal perforator and diagonal to the PDA.  -Medical management was recommended and at last OV added Imdur 30mg  daily -SOB has improved but still having intermittent CP although much improved -he wants to get a TKR but I would like his  angina under control prior to undergoing surgery -Continue prescription drug management with ASA 81mg  daily, Lopressor 50mg  BID and high dose statin  -I will stop his amlodipine to allow more BP to titrate antianginal therapy -increase Imdur to 60mg  daily and Ranexa to 1000mg  BID  2.  HTN -his BP is adequately controlled today -stop amlodipine to allow uptitration of long acting nitrates for CP -Continue prescription drug management with Lotensin 40mg  daily, Lopressor 50mg  BID, HCTZ 12.5mg  daily   3.  HLD -LDL goal < 70 -he was started on Atorvastatin 80mg  daily -LDL was at goal at 26 on 11/07/2020  Followup with me in 2-3 weeks   Medication Adjustments/Labs and Tests Ordered: Current medicines are reviewed at length with the patient today.  Concerns regarding medicines are outlined above.  Medication changes, Labs and Tests ordered today are listed in the Patient Instructions below.  There are no Patient Instructions on file for this visit.   Signed, Fransico Him, MD  11/09/2020 1:49 PM    [facility] [address], [place], [place]  [address] Phone: [phone]; Fax: [phone]

## 2020-11-22 DIAGNOSIS — M25561 Pain in right knee: Secondary | ICD-10-CM | POA: Diagnosis not present

## 2020-11-22 DIAGNOSIS — M25661 Stiffness of right knee, not elsewhere classified: Secondary | ICD-10-CM | POA: Diagnosis not present

## 2020-11-23 ENCOUNTER — Ambulatory Visit (INDEPENDENT_AMBULATORY_CARE_PROVIDER_SITE_OTHER): Payer: Medicare Other | Admitting: Internal Medicine

## 2020-11-23 ENCOUNTER — Other Ambulatory Visit: Payer: Self-pay

## 2020-11-23 ENCOUNTER — Encounter: Payer: Self-pay | Admitting: Internal Medicine

## 2020-11-23 VITALS — BP 133/79 | HR 58 | Ht 66.0 in | Wt 210.8 lb

## 2020-11-23 DIAGNOSIS — E6609 Other obesity due to excess calories: Secondary | ICD-10-CM | POA: Diagnosis not present

## 2020-11-23 DIAGNOSIS — I208 Other forms of angina pectoris: Secondary | ICD-10-CM

## 2020-11-23 DIAGNOSIS — G4733 Obstructive sleep apnea (adult) (pediatric): Secondary | ICD-10-CM

## 2020-11-23 DIAGNOSIS — Z6832 Body mass index (BMI) 32.0-32.9, adult: Secondary | ICD-10-CM | POA: Diagnosis not present

## 2020-11-23 DIAGNOSIS — I10 Essential (primary) hypertension: Secondary | ICD-10-CM | POA: Diagnosis not present

## 2020-11-23 DIAGNOSIS — Z9989 Dependence on other enabling machines and devices: Secondary | ICD-10-CM | POA: Diagnosis not present

## 2020-11-23 DIAGNOSIS — K760 Fatty (change of) liver, not elsewhere classified: Secondary | ICD-10-CM

## 2020-11-23 MED ORDER — ALPRAZOLAM 0.25 MG PO TABS
0.2500 mg | ORAL_TABLET | Freq: Every evening | ORAL | 0 refills | Status: DC | PRN
Start: 2020-11-23 — End: 2021-02-28

## 2020-11-23 NOTE — Assessment & Plan Note (Signed)
Patient has multivessel coronary artery disease but he is stable at the present time without any angina or arrhythmia.  I think he can undergo the surgery of the left knee with low risk.  Suggest early ambulation and And prevent Dehydration during or after surgery.  Resume aspirin as soon as feasible after the surgery

## 2020-11-23 NOTE — Progress Notes (Signed)
Established Patient Office Visit  Subjective:  Patient ID: Daniel Holden, male    DOB: [birth year]  Age: 75 y.o. MRN: [id number]  CC:  Chief Complaint  Patient presents with  . surgical clearance    HPI  Daniel Holden presents for clearance for left knee surgery  Past Medical History:  Diagnosis Date  . Allergy   . 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 Anxiety   . CAD (coronary artery disease), native coronary artery    coronary CTA showing aortic atherosclerosis with very high coronary Ca score at 4630 with severely calcified RCA and LAD > 70% and 50-69% prox to mid LCx.    . Carpal tunnel syndrome   . Hypertension   . Lung nodule   . Obesity   . Pancreatitis 2015  . Sleep apnea     Past Surgical History:  Procedure Laterality Date  . APPENDECTOMY     Patient states he did not have an appendectomy and does not know why this is in his chart. [facility] 06/28/2020  . CARPAL TUNNEL RELEASE    . KNEE ARTHROSCOPY WITH MEDIAL MENISECTOMY Left 06/25/2019   Procedure: KNEE ARTHROSCOPY WITH MEDIAL MENISECTOMY;  Surgeon: Thornton Park, MD;  Location: [facility] ORS;  Service: Orthopedics;  Laterality: Left;  . LEFT HEART CATH AND CORONARY ANGIOGRAPHY N/A 10/04/2020   Procedure: LEFT HEART CATH AND CORONARY ANGIOGRAPHY;  Surgeon: Leonie Man, MD;  Location: [place] [facility];  Service: Cardiovascular;  Laterality: N/A;  . SHOULDER SURGERY    . TONSILLECTOMY      Family History  Problem Relation Age of Onset  . CVA Father     Social History   Socioeconomic History  . Marital status: Married    Spouse name: Not on file  . Number of children: Not on file  . Years of education: Not on file  . Highest education level: Not on file  Occupational History  . Not on file  Tobacco Use  . Smoking status: Never Smoker  . Smokeless tobacco: Never Used  Vaping Use  . Vaping Use: Never used  Substance and Sexual Activity  . Alcohol use: Yes    Alcohol/week: 2.0 standard drinks    Types:  2 Cans of beer per week    Comment: 6 per day  . Drug use: No  . Sexual activity: Not on file  Other Topics Concern  . Not on file  Social History Narrative  . Not on file   Social Determinants of Health   Financial Resource Strain: Not on file  Food Insecurity: Not on file  Transportation Needs: Not on file  Physical Activity: Not on file  Stress: Not on file  Social Connections: Not on file  Intimate Partner Violence: Not on file     Current Outpatient Medications:  .  aspirin 81 MG EC tablet, Take 81 mg by mouth daily., Disp: , Rfl:  .  atorvastatin (LIPITOR) 80 MG tablet, Take 1 tablet (80 mg total) by mouth daily., Disp: 90 tablet, Rfl: 3 .  benazepril (LOTENSIN) 40 MG tablet, Take 1 tablet (40 mg total) by mouth daily., Disp: 90 tablet, Rfl: 1 .  Cholecalciferol 125 MCG (5000 UT) TABS, Take 5,000 Units by mouth daily., Disp: , Rfl:  .  cyanocobalamin 1000 MCG tablet, Take 1,000 mcg by mouth daily., Disp: , Rfl:  .  fluticasone (FLONASE) 50 MCG/ACT nasal spray, Place 2 sprays into both nostrils daily. , Disp: , Rfl:  .  gabapentin (NEURONTIN) 300 MG capsule, Take 300 mg by mouth 2 (two) times daily., Disp: , Rfl:  .  hydrochlorothiazide (MICROZIDE) 12.5 MG capsule, Take 12.5 mg by mouth daily., Disp: , Rfl:  .  isosorbide mononitrate (IMDUR) 60 MG 24 hr tablet, Take 1 tablet (60 mg total) by mouth daily., Disp: 90 tablet, Rfl: 3 .  melatonin 3 MG TABS tablet, Take 3 mg by mouth at bedtime., Disp: , Rfl:  .  metoprolol tartrate (LOPRESSOR) 50 MG tablet, Take 1 tablet (50 mg total) by mouth 2 (two) times daily., Disp: 180 tablet, Rfl: 3 .  naproxen sodium (ALEVE) 220 MG tablet, Take 220-440 mg by mouth See admin instructions. Take 440 mg in the morning and 220 mg at bedtime, Disp: , Rfl:  .  omeprazole (PRILOSEC OTC) 20 MG tablet, Take 10 mg by mouth daily., Disp: , Rfl:  .  ranolazine (RANEXA) 1000 MG SR tablet, Take 1 tablet (1,000 mg total) by mouth 2 (two) times daily.,  Disp: 180 tablet, Rfl: 3 .  ALPRAZolam (XANAX) 0.25 MG tablet, Take 1 tablet (0.25 mg total) by mouth at bedtime as needed for sleep., Disp: 14 tablet, Rfl: 0   Allergies  Allergen Reactions  . Dilaudid [Hydromorphone Hcl] Nausea And Vomiting    ROS Review of Systems  Constitutional: Negative.   HENT: Negative.   Eyes: Negative.   Respiratory: Negative.   Cardiovascular: Negative.   Gastrointestinal: Negative.   Endocrine: Negative.   Genitourinary: Negative.   Musculoskeletal: Negative.        Complaint of left knee pain.  Skin: Negative.   Allergic/Immunologic: Negative.   Neurological: Negative.   Hematological: Negative.   Psychiatric/Behavioral: Negative for behavioral problems and confusion. The patient is nervous/anxious.   All other systems reviewed and are negative.     Objective:    Physical Exam Cardiovascular:     Rate and Rhythm: Normal rate and regular rhythm.     Pulses: Normal pulses.     Heart sounds: No murmur heard. No friction rub.  Pulmonary:     Effort: Pulmonary effort is normal.     Breath sounds: No wheezing or rales.  Abdominal:     General: Bowel sounds are normal.     Tenderness: There is no abdominal tenderness. There is no guarding.  Musculoskeletal:     Cervical back: No rigidity.     BP 133/79   Pulse (!) 58   Ht 5' 6"  (1.676 m)   Wt 210 lb 12.8 oz (95.6 kg)   BMI 34.02 kg/m  Wt Readings from Last 3 Encounters:  11/23/20 210 lb 12.8 oz (95.6 kg)  10/25/20 210 lb 12.8 oz (95.6 kg)  10/20/20 212 lb 9.6 oz (96.4 kg)     Health Maintenance Due  Topic Date Due  . Hepatitis C Screening  Never done  . TETANUS/TDAP  Never done  . COLONOSCOPY (Pts 45-46yr Insurance coverage will need to be confirmed)  Never done  . Zoster Vaccines- Shingrix (1 of 2) Never done  . PNA vac Low Risk Adult (1 of 2 - PCV13) Never done    There are no preventive care reminders to display for this patient.  No results found for: TSH Lab Results   Component Value Date   WBC 6.2 09/30/2020   HGB 13.1 09/30/2020   HCT 36.8 (L) 09/30/2020   MCV 99.7 09/30/2020   PLT 197 09/30/2020   Lab Results  Component Value Date   NA 134 09/27/2020  K 4.7 09/27/2020   CO2 22 09/27/2020   GLUCOSE 137 (H) 09/27/2020   BUN 11 09/27/2020   CREATININE 0.84 09/27/2020   BILITOT 1.4 (H) 04/22/2017   ALKPHOS 50 04/22/2017   AST 36 04/22/2017   ALT 17 11/07/2020   PROT 8.6 (H) 04/22/2017   ALBUMIN 4.7 04/22/2017   CALCIUM 9.3 09/27/2020   ANIONGAP 11 06/22/2019   EGFR 93 09/27/2020   Lab Results  Component Value Date   CHOL 104 11/07/2020   Lab Results  Component Value Date   HDL 58 11/07/2020   Lab Results  Component Value Date   LDLCALC 26 11/07/2020   Lab Results  Component Value Date   TRIG 111 11/07/2020   Lab Results  Component Value Date   CHOLHDL 1.8 11/07/2020   No results found for: HGBA1C    Assessment & Plan:   Problem List Items Addressed This Visit      Cardiovascular and Mediastinum   Primary hypertension - Primary    Patient blood pressure is normal patient denies any chest pain or shortness of breath there is no history of palpitation or paroxysmal nocturnal dyspnea   patient was advised to follow low-salt low-cholesterol diet    ideally I want to keep systolic blood pressure below 130 mmHg, patient was asked to check blood pressure one times a week and give me a report on that.  Patient will be follow-up in 3 months  or earlier as needed, patient will call me back for any change in the cardiovascular symptoms Patient was advised to buy a book from local bookstore concerning blood pressure and read several chapters  every day.  This will be supplemented by some of the material we will give him from the office.  Patient should also utilize other resources like YouTube and Internet to learn more about the blood pressure and the diet.      Angina of effort [facility])    Patient has multivessel coronary artery  disease but he is stable at the present time without any angina or arrhythmia.  I think he can undergo the surgery of the left knee with low risk.  Suggest early ambulation and And prevent Dehydration during or after surgery.  Resume aspirin as soon as feasible after the surgery        Respiratory   OSA on CPAP    Patient was advised to use a CPAP during the hospitalization        Digestive   Hepatic steatosis    Advised to stop drinking alcohol completely.        Other   Class 1 obesity due to excess calories with serious comorbidity and body mass index (BMI) of 32.0 to 32.9 in adult    Suggest low caloric diet.         Meds ordered this encounter  Medications  . ALPRAZolam (XANAX) 0.25 MG tablet    Sig: Take 1 tablet (0.25 mg total) by mouth at bedtime as needed for sleep.    Dispense:  14 tablet    Refill:  0    Follow-up: No follow-ups on file.    Cletis Athens, MD

## 2020-11-23 NOTE — Assessment & Plan Note (Signed)
Suggest low caloric diet.

## 2020-11-23 NOTE — Assessment & Plan Note (Signed)
Patient was advised to use a CPAP during the hospitalization

## 2020-11-23 NOTE — Assessment & Plan Note (Signed)

## 2020-11-23 NOTE — Assessment & Plan Note (Signed)
Advised to stop drinking alcohol completely.

## 2020-11-28 ENCOUNTER — Other Ambulatory Visit: Payer: Self-pay

## 2020-11-28 ENCOUNTER — Ambulatory Visit (INDEPENDENT_AMBULATORY_CARE_PROVIDER_SITE_OTHER): Payer: Medicare Other | Admitting: Cardiology

## 2020-11-28 ENCOUNTER — Encounter: Payer: Self-pay | Admitting: Cardiology

## 2020-11-28 VITALS — BP 112/60 | HR 56 | Ht 66.0 in | Wt 208.0 lb

## 2020-11-28 DIAGNOSIS — Z01818 Encounter for other preprocedural examination: Secondary | ICD-10-CM

## 2020-11-28 DIAGNOSIS — I10 Essential (primary) hypertension: Secondary | ICD-10-CM

## 2020-11-28 DIAGNOSIS — Z01810 Encounter for preprocedural cardiovascular examination: Secondary | ICD-10-CM

## 2020-11-28 DIAGNOSIS — I25118 Atherosclerotic heart disease of native coronary artery with other forms of angina pectoris: Secondary | ICD-10-CM | POA: Diagnosis not present

## 2020-11-28 DIAGNOSIS — E7800 Pure hypercholesterolemia, unspecified: Secondary | ICD-10-CM | POA: Diagnosis not present

## 2020-11-28 DIAGNOSIS — I208 Other forms of angina pectoris: Secondary | ICD-10-CM | POA: Diagnosis not present

## 2020-11-28 NOTE — Patient Instructions (Signed)
Medication Instructions:  Your physician recommends that you continue on your current medications as directed. Please refer to the Current Medication list given to you today.  *If you need a refill on your cardiac medications before your next appointment, please call your pharmacy*  Follow-Up: At [facility], you and your health needs are our priority.  As part of our continuing mission to provide you with exceptional heart care, we have created designated Provider Care Teams.  These Care Teams include your primary Cardiologist (physician) and Advanced Practice Providers (APPs -  Physician Assistants and Nurse Practitioners) who all work together to provide you with the care you need, when you need it.  Your next appointment:   3 month(s)  The format for your next appointment:   In Person  Provider:   You may see Fransico Him, MD or one of the following Advanced Practice Providers on your designated Care Team:    Melina Copa, PA-C  Ermalinda Barrios, PA-C

## 2020-11-28 NOTE — Progress Notes (Signed)
Cardiology Office Note    Date:  11/28/2020   ID:  Daniel Holden, DOB [birth year], MRN [id number]  PCP:  Cletis Athens, MD  Cardiologist:  Fransico Him, MD   Chief Complaint  Patient presents with  . Coronary Artery Disease  . Hypertension  . Hyperlipidemia    History of Present Illness:  Daniel Holden is a 75 y.o. male with a hx of anxiety, HTN and obesity who was recently referred for evaluation of chest pain. A year ago he started waking up in the middle of the night with severe pains in the his arms every other night.  He had a ETT that was normal.  He then was dx with severe carpal tunnel in his arms by EMG.  He underwent occupational therapy and PT with no improvement.  He has had an extensive workup with MRI of the spine and was started on gabapentin with no improvement.  He underwent carpal tunnel surgery on the right had with some improvement. He then he starting developing pain in his chest with any exertion.    He underwent coronary CTA showing aortic atherosclerosis with very high coronary Ca score at 4630 with severely calcified RCA and LAD > 70% and 50-69% prox to mid LCx.  Cardiac cath 10/04/2020 showed severe single vessel CAD with occluded RCA with extensive calcifications and multiple different lesions including 90% proxm and distal lesions, 50% oLCx and 50-60% dLCx and 40% pLAD with heavily calcified vessels with bridging collaterals from the septal perforator and diagonal to the PDA. Medical management was recommended.  He was started on Ranexa.  When I saw him last he was still having intermittent CP but no necessarily with exertion.  He also was still having DOE.  Imdur 30mg  daily was added and now he is back for followup.  He is here today for followup and is doing well.  Since our last antianginal medication changes, he has not had any anginal symptoms.  He has been going up stairs and using a recumbent bikes with no angina.  He denies any chest pain or pressure,  SOB, DOE, PND, orthopnea, LE edema, dizziness, palpitations or syncope. He is compliant with his meds and is tolerating meds with no SE.    Past Medical History:  Diagnosis Date  . Allergy   . 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 Anxiety   . CAD (coronary artery disease), native coronary artery    coronary CTA showing aortic atherosclerosis with very high coronary Ca score at 4630 with severely calcified RCA and LAD > 70% and 50-69% prox to mid LCx.    . Carpal tunnel syndrome   . Hypertension   . Lung nodule   . Obesity   . Pancreatitis 2015  . Sleep apnea     Past Surgical History:  Procedure Laterality Date  . APPENDECTOMY     Patient states he did not have an appendectomy and does not know why this is in his chart. [facility] 06/28/2020  . CARPAL TUNNEL RELEASE    . KNEE ARTHROSCOPY WITH MEDIAL MENISECTOMY Left 06/25/2019   Procedure: KNEE ARTHROSCOPY WITH MEDIAL MENISECTOMY;  Surgeon: Thornton Park, MD;  Location: [facility] ORS;  Service: Orthopedics;  Laterality: Left;  . LEFT HEART CATH AND CORONARY ANGIOGRAPHY N/A 10/04/2020   Procedure: LEFT HEART CATH AND CORONARY ANGIOGRAPHY;  Surgeon: Leonie Man, MD;  Location: [facility];  Service: Cardiovascular;  Laterality: N/A;  . SHOULDER SURGERY    . TONSILLECTOMY  Current Medications: Current Meds  Medication Sig  . ALPRAZolam (XANAX) 0.25 MG tablet Take 1 tablet (0.25 mg total) by mouth at bedtime as needed for sleep.  [name] aspirin 81 MG EC tablet Take 81 mg by mouth daily.  [name] atorvastatin (LIPITOR) 80 MG tablet Take 1 tablet (80 mg total) by mouth daily.  . benazepril (LOTENSIN) 40 MG tablet Take 1 tablet (40 mg total) by mouth daily.  . Cholecalciferol 125 MCG (5000 UT) TABS Take 5,000 Units by mouth daily.  . cyanocobalamin 1000 MCG tablet Take 1,000 mcg by mouth daily.  . fluticasone (FLONASE) 50 MCG/ACT nasal spray Place 2 sprays into both nostrils daily.   [name] gabapentin (NEURONTIN) 300 MG capsule Take 300 mg by mouth 2 (two)  times daily.  . hydrochlorothiazide (MICROZIDE) 12.5 MG capsule Take 12.5 mg by mouth daily.  . isosorbide mononitrate (IMDUR) 60 MG 24 hr tablet Take 1 tablet (60 mg total) by mouth daily.  . melatonin 3 MG TABS tablet Take 3 mg by mouth at bedtime.  . metoprolol tartrate (LOPRESSOR) 50 MG tablet Take 1 tablet (50 mg total) by mouth 2 (two) times daily.  . naproxen sodium (ALEVE) 220 MG tablet Take 220-440 mg by mouth See admin instructions. Take 440 mg in the morning and 220 mg at bedtime  . omeprazole (PRILOSEC OTC) 20 MG tablet Take 10 mg by mouth daily.  . ranolazine (RANEXA) 1000 MG SR tablet Take 1 tablet (1,000 mg total) by mouth 2 (two) times daily.    Allergies:   Dilaudid [hydromorphone hcl]   Social History   Socioeconomic History  . Marital status: Married    Spouse name: Not on file  . Number of children: Not on file  . Years of education: Not on file  . Highest education level: Not on file  Occupational History  . Not on file  Tobacco Use  . Smoking status: Never Smoker  . Smokeless tobacco: Never Used  Vaping Use  . Vaping Use: Never used  Substance and Sexual Activity  . Alcohol use: Yes    Alcohol/week: 2.0 standard drinks    Types: 2 Cans of beer per week    Comment: 6 per day  . Drug use: No  . Sexual activity: Not on file  Other Topics Concern  . Not on file  Social History Narrative  . Not on file   Social Determinants of Health   Financial Resource Strain: Not on file  Food Insecurity: Not on file  Transportation Needs: Not on file  Physical Activity: Not on file  Stress: Not on file  Social Connections: Not on file     Family History:  The patient's family history includes CVA in his father.   ROS:   Please see the history of present illness.    Review of Systems  Musculoskeletal: Negative for muscle weakness.   All other systems reviewed and are negative.  No flowsheet data found.     PHYSICAL EXAM:   VS:  BP 112/60   Pulse (!)  56   Ht 5\' 6"  (1.676 m)   Wt 208 lb (94.3 kg)   SpO2 97%   BMI 33.57 kg/m    GEN: Well nourished, well developed in no acute distress HEENT: Normal NECK: No JVD; No carotid bruits LYMPHATICS: No lymphadenopathy CARDIAC:RRR, no murmurs, rubs, gallops RESPIRATORY:  Clear to auscultation without rales, wheezing or rhonchi  ABDOMEN: Soft, non-tender, non-distended MUSCULOSKELETAL:  No edema; No deformity  SKIN: Warm and  dry NEUROLOGIC:  Alert and oriented x 3 PSYCHIATRIC:  Normal affect    Wt Readings from Last 3 Encounters:  11/28/20 208 lb (94.3 kg)  11/23/20 210 lb 12.8 oz (95.6 kg)  10/25/20 210 lb 12.8 oz (95.6 kg)      Studies/Labs Reviewed:   EKG:  EKG is not ordered today.    Recent Labs: 09/27/2020: BUN 11; Creatinine, Ser 0.84; Potassium 4.7; Sodium 134 09/30/2020: Hemoglobin 13.1; Platelets 197 11/07/2020: ALT 17   Lipid Panel    Component Value Date/Time   CHOL 104 11/07/2020 0741   CHOL 83 09/27/2013 0319   TRIG 111 11/07/2020 0741   TRIG 188 09/27/2013 0319   HDL 58 11/07/2020 0741   HDL 28 (L) 09/27/2013 0319   CHOLHDL 1.8 11/07/2020 0741   VLDL 38 09/27/2013 0319   LDLCALC 26 11/07/2020 0741   LDLCALC 17 09/27/2013 0319    Additional studies/ records that were reviewed today include:  Cardiac cath 10/04/2020 Diagnostic Dominance: Right       ASSESSMENT:    1. Coronary artery disease of native artery of native heart with stable angina pectoris ([place])   2. Primary hypertension   3. Pure hypercholesterolemia   4. Preoperative clearance      PLAN:  In order of problems listed above:  1.  ASCAD with chronic stable angina -coronary CTA showing aortic atherosclerosis with very high coronary Ca score at 4630 with severely calcified RCA and LAD > 70% and 50-69% prox to mid LCx.   -Cardiac cath 10/04/2020 showed severe single vessel CAD with occluded RCA with extensive calcifications and multiple different lesions including 90% proxm and distal  lesions, 50% oLCx and 50-60% dLCx and 40% pLAD with heavily calcified vessels with bridging collaterals from the septal perforator and diagonal to the PDA.  -Medical management was recommended and at last OV added Imdur 30mg  daily -his angina is under good control with last med changes -Continue prescription drug management with ASA 81mg  daily, Lopressor 50mg  BID and high dose statinContinue prescription drug management with ASA 81mg  daily, Lopressor 50mg  BID, Imdur 60mg  daily, Ranexa 1000mg  BID and high dose statin   2.  HTN -His BP is controlled on exam today -Continue prescription drug management with Lopressor 50mg  BID, HCTZ 12.5mg  daily, Lotensin 40mg  daily -I have personally reviewed and interpreted outside labs performed by patient's PCP which showed SCr 0.84, K+ 4.7   3.  HLD -LDL goal < 70 -Continue prescription drug management with Atorvastatin 80mg  daily -LDL was at goal at 26 on 11/07/2020  4.  Preoperative clearance -his functional capacity by the [facility] Activity Status Index is 7.25 mets and therefore no further ischemic workup indicated -Based on the revised cardiac risk index, he has a 0.9% perioperative risk of major cardiac events -his angina is well controlled on current antianginal meds -recommend continuation of cardiac meds in the periop period -ok to hold ASA for surgery  Followup with me in 3 months   Medication Adjustments/Labs and Tests Ordered: Current medicines are reviewed at length with the patient today.  Concerns regarding medicines are outlined above.  Medication changes, Labs and Tests ordered today are listed in the Patient Instructions below.  There are no Patient Instructions on file for this visit.   Signed, Fransico Him, MD  11/28/2020 9:10 AM    [facility] [place], [place], [place]  [address] Phone: [phone]; Fax: [phone]

## 2020-12-05 DIAGNOSIS — Z01812 Encounter for preprocedural laboratory examination: Secondary | ICD-10-CM | POA: Diagnosis not present

## 2020-12-05 DIAGNOSIS — M1712 Unilateral primary osteoarthritis, left knee: Secondary | ICD-10-CM | POA: Diagnosis not present

## 2020-12-05 DIAGNOSIS — I10 Essential (primary) hypertension: Secondary | ICD-10-CM | POA: Diagnosis not present

## 2020-12-07 DIAGNOSIS — Z7982 Long term (current) use of aspirin: Secondary | ICD-10-CM | POA: Diagnosis not present

## 2020-12-07 DIAGNOSIS — E669 Obesity, unspecified: Secondary | ICD-10-CM | POA: Diagnosis not present

## 2020-12-07 DIAGNOSIS — G8918 Other acute postprocedural pain: Secondary | ICD-10-CM | POA: Diagnosis not present

## 2020-12-07 DIAGNOSIS — E785 Hyperlipidemia, unspecified: Secondary | ICD-10-CM | POA: Diagnosis not present

## 2020-12-07 DIAGNOSIS — I25119 Atherosclerotic heart disease of native coronary artery with unspecified angina pectoris: Secondary | ICD-10-CM | POA: Diagnosis not present

## 2020-12-07 DIAGNOSIS — K219 Gastro-esophageal reflux disease without esophagitis: Secondary | ICD-10-CM | POA: Diagnosis not present

## 2020-12-07 DIAGNOSIS — Z823 Family history of stroke: Secondary | ICD-10-CM | POA: Diagnosis not present

## 2020-12-07 DIAGNOSIS — F1011 Alcohol abuse, in remission: Secondary | ICD-10-CM | POA: Diagnosis not present

## 2020-12-07 DIAGNOSIS — I10 Essential (primary) hypertension: Secondary | ICD-10-CM | POA: Diagnosis not present

## 2020-12-07 DIAGNOSIS — G4733 Obstructive sleep apnea (adult) (pediatric): Secondary | ICD-10-CM | POA: Diagnosis not present

## 2020-12-07 DIAGNOSIS — Z87891 Personal history of nicotine dependence: Secondary | ICD-10-CM | POA: Diagnosis not present

## 2020-12-07 DIAGNOSIS — M25562 Pain in left knee: Secondary | ICD-10-CM | POA: Diagnosis not present

## 2020-12-07 DIAGNOSIS — M1712 Unilateral primary osteoarthritis, left knee: Secondary | ICD-10-CM | POA: Diagnosis not present

## 2020-12-07 DIAGNOSIS — G4700 Insomnia, unspecified: Secondary | ICD-10-CM | POA: Diagnosis not present

## 2020-12-07 DIAGNOSIS — Z6832 Body mass index (BMI) 32.0-32.9, adult: Secondary | ICD-10-CM | POA: Diagnosis not present

## 2020-12-07 DIAGNOSIS — F419 Anxiety disorder, unspecified: Secondary | ICD-10-CM | POA: Diagnosis not present

## 2020-12-07 DIAGNOSIS — I2510 Atherosclerotic heart disease of native coronary artery without angina pectoris: Secondary | ICD-10-CM | POA: Diagnosis not present

## 2020-12-08 DIAGNOSIS — G4733 Obstructive sleep apnea (adult) (pediatric): Secondary | ICD-10-CM | POA: Diagnosis not present

## 2020-12-08 DIAGNOSIS — F1011 Alcohol abuse, in remission: Secondary | ICD-10-CM | POA: Diagnosis not present

## 2020-12-08 DIAGNOSIS — G4700 Insomnia, unspecified: Secondary | ICD-10-CM | POA: Diagnosis not present

## 2020-12-08 DIAGNOSIS — M1712 Unilateral primary osteoarthritis, left knee: Secondary | ICD-10-CM | POA: Diagnosis not present

## 2020-12-08 DIAGNOSIS — F419 Anxiety disorder, unspecified: Secondary | ICD-10-CM | POA: Diagnosis not present

## 2020-12-08 DIAGNOSIS — I10 Essential (primary) hypertension: Secondary | ICD-10-CM | POA: Diagnosis not present

## 2020-12-09 ENCOUNTER — Telehealth: Payer: Self-pay | Admitting: Cardiology

## 2020-12-09 NOTE — Telephone Encounter (Signed)
Pt states that he is recovering from knee surgery, he is experiencing congestion and would like to know if it is safe to take guaifenesin.

## 2020-12-09 NOTE — Telephone Encounter (Signed)
Spoke with pt and advised ok to use plain Mucinex/Guaifenesin, Robitussin or Coricidin HBP meds for congestion.  Pt verbalized understanding and was in agreement with plan.

## 2020-12-11 ENCOUNTER — Emergency Department: Payer: Medicare Other

## 2020-12-11 ENCOUNTER — Other Ambulatory Visit: Payer: Self-pay

## 2020-12-11 ENCOUNTER — Encounter: Payer: Self-pay | Admitting: Emergency Medicine

## 2020-12-11 ENCOUNTER — Emergency Department
Admission: EM | Admit: 2020-12-11 | Discharge: 2020-12-11 | Disposition: A | Discharge status: Home / Self Care | Payer: Medicare Other | Attending: Emergency Medicine | Admitting: Emergency Medicine

## 2020-12-11 DIAGNOSIS — S7012XA Contusion of left thigh, initial encounter: Secondary | ICD-10-CM | POA: Diagnosis not present

## 2020-12-11 DIAGNOSIS — X58XXXA Exposure to other specified factors, initial encounter: Secondary | ICD-10-CM | POA: Diagnosis not present

## 2020-12-11 DIAGNOSIS — Z96652 Presence of left artificial knee joint: Secondary | ICD-10-CM | POA: Insufficient documentation

## 2020-12-11 DIAGNOSIS — S8002XA Contusion of left knee, initial encounter: Secondary | ICD-10-CM | POA: Insufficient documentation

## 2020-12-11 DIAGNOSIS — K5903 Drug induced constipation: Secondary | ICD-10-CM | POA: Insufficient documentation

## 2020-12-11 DIAGNOSIS — R069 Unspecified abnormalities of breathing: Secondary | ICD-10-CM | POA: Diagnosis not present

## 2020-12-11 DIAGNOSIS — I10 Essential (primary) hypertension: Secondary | ICD-10-CM | POA: Insufficient documentation

## 2020-12-11 DIAGNOSIS — R457 State of emotional shock and stress, unspecified: Secondary | ICD-10-CM | POA: Diagnosis not present

## 2020-12-11 DIAGNOSIS — Z79899 Other long term (current) drug therapy: Secondary | ICD-10-CM | POA: Diagnosis not present

## 2020-12-11 DIAGNOSIS — Z7982 Long term (current) use of aspirin: Secondary | ICD-10-CM | POA: Insufficient documentation

## 2020-12-11 DIAGNOSIS — I2510 Atherosclerotic heart disease of native coronary artery without angina pectoris: Secondary | ICD-10-CM | POA: Insufficient documentation

## 2020-12-11 DIAGNOSIS — T402X5A Adverse effect of other opioids, initial encounter: Secondary | ICD-10-CM

## 2020-12-11 DIAGNOSIS — S8992XA Unspecified injury of left lower leg, initial encounter: Secondary | ICD-10-CM | POA: Diagnosis present

## 2020-12-11 DIAGNOSIS — R0902 Hypoxemia: Secondary | ICD-10-CM | POA: Diagnosis not present

## 2020-12-11 DIAGNOSIS — R0602 Shortness of breath: Secondary | ICD-10-CM | POA: Diagnosis not present

## 2020-12-11 DIAGNOSIS — K5900 Constipation, unspecified: Secondary | ICD-10-CM | POA: Diagnosis not present

## 2020-12-11 MED ORDER — FLEET ENEMA 7-19 GM/118ML RE ENEM
1.0000 | ENEMA | Freq: Once | RECTAL | Status: AC
  Administered 2020-12-11: 1 via RECTAL

## 2020-12-11 NOTE — ED Notes (Signed)
Pt to bed with no assistance. Pt denies any pain and is currently passing gas. He called ortho surgeon office and they advsied to come to ER for constipation to rule out obstruction. Pt has been taking OTC meds with no relief.

## 2020-12-11 NOTE — ED Notes (Signed)
Pt rang to inform staff of his success of a BM. Tech notes BM to be normal in texture and color. No hard stool to note. Pt said he "feels like there is still some in there, but I think the jist of it came out" Pt wondering if he will do another one here or will he be sent home with one.   Fleet mineral oil did not work [employer] MP worked

## 2020-12-11 NOTE — ED Provider Notes (Signed)
[facility] Emergency Department Provider Note ____________________________________________  Time seen: 1200  I have reviewed the triage vital signs and the nursing notes.  HISTORY  Chief Complaint  Constipation   HPI Daniel Holden is a 75 y.o. male presents to the ER today with complaint of constipation.  He reports this started 4 days ago.  He reports he had a left total knee replacement on Wednesday by Dr. Harlow Mares and has not had a bowel movement since that time.  He is currently taking narcotics.  He denies nausea, vomiting, abdominal pain, diarrhea, blood in his stool.  He is passing gas.  He has tried MiraLAX, senna S, Colace and a mineral oil enema with minimal relief of symptoms.  He has not really ever struggled with constipation in the past.  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 [facility] 06/28/2020   CARPAL TUNNEL RELEASE     KNEE ARTHROSCOPY WITH MEDIAL MENISECTOMY Left 06/25/2019   Procedure: KNEE ARTHROSCOPY WITH MEDIAL MENISECTOMY;  Surgeon: Thornton Park, MD;  Location: [facility] ORS;  Service: Orthopedics;  Laterality: Left;   LEFT HEART CATH AND CORONARY ANGIOGRAPHY N/A 10/04/2020   Procedure: LEFT HEART CATH AND CORONARY ANGIOGRAPHY;  Surgeon: Leonie Man, MD;  Location: [facility];  Service: Cardiovascular;  Laterality: N/A;   SHOULDER SURGERY     TONSILLECTOMY      Prior to Admission medications   Medication Sig Start Date End Date Taking? Authorizing Provider  ALPRAZolam (XANAX) 0.25 MG tablet Take 1 tablet (0.25 mg total) by mouth at bedtime as needed for sleep. 11/23/20   Cletis Athens, MD  aspirin 81 MG EC tablet Take 81 mg by mouth daily.    [provider]  atorvastatin (LIPITOR) 80 MG tablet Take 1 tablet (80 mg total) by mouth daily. 09/30/20   Sueanne Margarita, MD  benazepril (LOTENSIN) 40 MG tablet Take 1 tablet (40 mg total) by mouth daily. 10/05/20   Cletis Athens, MD  Cholecalciferol 125 MCG (5000 UT) TABS Take 5,000 Units by mouth daily.    [provider]  cyanocobalamin 1000 MCG tablet Take 1,000 mcg by  mouth daily.    [provider]  fluticasone (FLONASE) 50 MCG/ACT nasal spray Place 2 sprays into both nostrils daily.  10/27/13   [provider]  gabapentin (NEURONTIN) 300 MG capsule Take 300 mg by mouth 2 (two) times daily.    [provider]  hydrochlorothiazide (MICROZIDE) 12.5 MG capsule Take 12.5 mg by mouth daily.    [provider]  isosorbide mononitrate (IMDUR) 60 MG 24 hr tablet Take 1 tablet (60 mg total) by mouth daily. 11/09/20   Sueanne Margarita, MD  melatonin 3 MG TABS tablet Take 3 mg by mouth at bedtime.    [provider]  metoprolol tartrate (LOPRESSOR) 50 MG tablet Take 1 tablet (50 mg total) by  mouth 2 (two) times daily. 07/12/20   Cletis Athens, MD  naproxen sodium (ALEVE) 220 MG tablet Take 220-440 mg by mouth See admin instructions. Take 440 mg in the morning and 220 mg at bedtime    [provider]  omeprazole (PRILOSEC OTC) 20 MG tablet Take 10 mg by mouth daily.    [provider]  ranolazine (RANEXA) 1000 MG SR tablet Take 1 tablet (1,000 mg total) by mouth 2 (two) times daily. 11/09/20   Sueanne Margarita, MD    Allergies Dilaudid [hydromorphone hcl]  Family History  Problem Relation Age of Onset   CVA Father     Social History Social History   Tobacco Use   Smoking status: Never   Smokeless tobacco: Never  Vaping Use   Vaping Use: Never used  Substance Use Topics   Alcohol use: Yes    Alcohol/week: 2.0 standard drinks    Types: 2 Cans of beer per week    Comment: 6 per day   Drug use: No    Review of Systems  Constitutional: Negative for fever, chills or body. Cardiovascular: Negative for chest pain or chest tightness. Respiratory: Negative for cough or shortness of breath. Gastrointestinal: Positive for constipation.  Negative for abdominal pain, nausea, vomiting, diarrhea or blood in stool. Musculoskeletal: Positive for left knee pain and swelling status post TKR 5 days ago. Skin: Positive for bruising to the left knee and left thigh. Neurological: Negative for headaches, focal weakness, tingling or numbness. ____________________________________________  PHYSICAL EXAM:  VITAL SIGNS: ED Triage Vitals  Enc Vitals Group     BP 12/11/20 0946 120/64     Pulse Rate 12/11/20 0946 62     Resp 12/11/20 0946 18     Temp 12/11/20 0946 98 F (36.7 C)     Temp Source 12/11/20 0946 Oral     SpO2 12/11/20 0946 98 %     Weight 12/11/20 0946 200 lb (90.7 kg)     Height 12/11/20 0946 5\' 6"  (1.676 m)     Head Circumference --      Peak Flow --      Pain Score 12/11/20 0946 0     Pain Loc --      Pain Edu? --      Excl. in [place]? --      Constitutional: Alert and oriented.  Obese, in no distress. Head: Normocephalic. Cardiovascular: Normal rate, regular rhythm. Respiratory: Normal respiratory effort. No wheezes/rales/rhonchi. Gastrointestinal: Soft and nontender.  Hypoactive bowel sounds.  No distention. Rectal: Hard stool noted in the rectal vault. Musculoskeletal: Decreased flexion and extension of the left knee secondary to recent surgery. Neurologic:  Normal speech and language. No gross focal neurologic deficits are appreciated. Skin: Incision with surgical dressing  noted over the anterior knee.  Bruising noted of the left knee and left thigh. ____________________________________________   RADIOLOGY Imaging Orders  DG Abdomen 1 View  IMPRESSION:  No radiographic evidence of small-bowel obstruction.  Large stool burden throughout the colon, compatible with the provided history of constipation.   ____________________________________________   INITIAL IMPRESSION / ASSESSMENT AND PLAN / ED COURSE  Opioid Induced Constipation:  KUB shows large stool burden without obstruction He had good relief with fleets enema x 2 in ER with good results Recommend MiraLAX in the a.m., Senna S in the p.m. Follow-up with your surgeon as previously scheduled   ____________________________________________  FINAL CLINICAL IMPRESSION(S) / ED DIAGNOSES  Final diagnoses:  Constipation due to opioid therapy      Jearld Fenton, NP 12/11/20 1348    Blake Divine, MD 12/11/20 1523

## 2020-12-11 NOTE — ED Triage Notes (Signed)
Pt reports had knee surgery on Wednesday and has not had a BM since then. Pt reports is on narcotic pain meds but has also been taking a stool softner

## 2020-12-11 NOTE — Discharge Instructions (Addendum)
You were seen today for opioid-induced constipation.  You received 2 fleets enema with good results.  Please take your MiraLAX every morning and senna S every night.  Please follow-up with your surgeon as previously planned.

## 2020-12-22 ENCOUNTER — Other Ambulatory Visit: Payer: Self-pay | Admitting: Internal Medicine

## 2020-12-28 DIAGNOSIS — Z96652 Presence of left artificial knee joint: Secondary | ICD-10-CM | POA: Diagnosis not present

## 2020-12-28 DIAGNOSIS — M1712 Unilateral primary osteoarthritis, left knee: Secondary | ICD-10-CM | POA: Diagnosis not present

## 2021-01-02 DIAGNOSIS — Z96652 Presence of left artificial knee joint: Secondary | ICD-10-CM | POA: Diagnosis not present

## 2021-01-02 DIAGNOSIS — M1712 Unilateral primary osteoarthritis, left knee: Secondary | ICD-10-CM | POA: Diagnosis not present

## 2021-01-04 ENCOUNTER — Ambulatory Visit (INDEPENDENT_AMBULATORY_CARE_PROVIDER_SITE_OTHER): Payer: Medicare Other | Admitting: Internal Medicine

## 2021-01-04 ENCOUNTER — Encounter: Payer: Self-pay | Admitting: Internal Medicine

## 2021-01-04 ENCOUNTER — Other Ambulatory Visit: Payer: Self-pay

## 2021-01-04 VITALS — BP 130/80 | HR 82 | Ht 66.0 in | Wt 200.0 lb

## 2021-01-04 DIAGNOSIS — Z9989 Dependence on other enabling machines and devices: Secondary | ICD-10-CM | POA: Diagnosis not present

## 2021-01-04 DIAGNOSIS — I208 Other forms of angina pectoris: Secondary | ICD-10-CM

## 2021-01-04 DIAGNOSIS — G629 Polyneuropathy, unspecified: Secondary | ICD-10-CM

## 2021-01-04 DIAGNOSIS — I10 Essential (primary) hypertension: Secondary | ICD-10-CM

## 2021-01-04 DIAGNOSIS — G4733 Obstructive sleep apnea (adult) (pediatric): Secondary | ICD-10-CM

## 2021-01-04 DIAGNOSIS — Z6832 Body mass index (BMI) 32.0-32.9, adult: Secondary | ICD-10-CM

## 2021-01-04 DIAGNOSIS — K760 Fatty (change of) liver, not elsewhere classified: Secondary | ICD-10-CM

## 2021-01-04 DIAGNOSIS — E6609 Other obesity due to excess calories: Secondary | ICD-10-CM | POA: Diagnosis not present

## 2021-01-04 LAB — CBC WITH DIFFERENTIAL/PLATELET
Absolute Monocytes: 638 cells/uL (ref 200–950)
Basophils Absolute: 41 cells/uL (ref 0–200)
Basophils Relative: 0.8 %
Eosinophils Absolute: 102 cells/uL (ref 15–500)
Eosinophils Relative: 2 %
HCT: 31.7 % — ABNORMAL LOW (ref 38.5–50.0)
Hemoglobin: 10.5 g/dL — ABNORMAL LOW (ref 13.2–17.1)
Lymphs Abs: 1229 cells/uL (ref 850–3900)
MCH: 34.9 pg — ABNORMAL HIGH (ref 27.0–33.0)
MCHC: 33.1 g/dL (ref 32.0–36.0)
MCV: 105.3 fL — ABNORMAL HIGH (ref 80.0–100.0)
MPV: 9.8 fL (ref 7.5–12.5)
Monocytes Relative: 12.5 %
Neutro Abs: 3091 cells/uL (ref 1500–7800)
Neutrophils Relative %: 60.6 %
Platelets: 284 10*3/uL (ref 140–400)
RBC: 3.01 10*6/uL — ABNORMAL LOW (ref 4.20–5.80)
RDW: 11.5 % (ref 11.0–15.0)
Total Lymphocyte: 24.1 %
WBC: 5.1 10*3/uL (ref 3.8–10.8)

## 2021-01-04 NOTE — Assessment & Plan Note (Signed)

## 2021-01-04 NOTE — Assessment & Plan Note (Signed)

## 2021-01-04 NOTE — Progress Notes (Signed)
Established Patient Office Visit  Subjective:  Patient ID: Daniel Holden, male    DOB: [birth year]  Age: 75 y.o. MRN: [id number]  CC:  Chief Complaint  Patient presents with   discuss labs    HPI  Daniel Holden presents for patient is a left knee replacement surgery in the hospital he is doing well He is worried about his low hemoglobin so we will do a CBC on him today He has coronary artery disease but denies any chest pain.  Right coronary artery is 90% blocked.  But he has collateral circulation.  Did not have any angina at the present time.  He stopped drinking alcohol.  He is trying to lose weight.  Past Medical History:  Diagnosis Date   Allergy    Angina of effort ([place])    Anxiety    CAD (coronary artery disease), native coronary artery    coronary CTA showing aortic atherosclerosis with very high coronary Ca score at 4630 with severely calcified RCA and LAD > 70% and 50-69% prox to mid LCx.     Carpal tunnel syndrome    Hypertension    Lung nodule    Obesity    Pancreatitis 2015   Sleep apnea     Past Surgical History:  Procedure Laterality Date   APPENDECTOMY     Patient states he did not have an appendectomy and does not know why this is in his chart. [place] 06/28/2020   CARPAL TUNNEL RELEASE     KNEE ARTHROSCOPY WITH MEDIAL MENISECTOMY Left 06/25/2019   Procedure: KNEE ARTHROSCOPY WITH MEDIAL MENISECTOMY;  Surgeon: Thornton Park, MD;  Location: [facility];  Service: Orthopedics;  Laterality: Left;   LEFT HEART CATH AND CORONARY ANGIOGRAPHY N/A 10/04/2020   Procedure: LEFT HEART CATH AND CORONARY ANGIOGRAPHY;  Surgeon: Leonie Man, MD;  Location: [facility];  Service: Cardiovascular;  Laterality: N/A;   SHOULDER SURGERY     TONSILLECTOMY      Family History  Problem Relation Age of Onset   CVA Father     Social History   Socioeconomic History   Marital status: Married    Spouse name: Not on file   Number of children: Not on file   Years  of education: Not on file   Highest education level: Not on file  Occupational History   Not on file  Tobacco Use   Smoking status: Never   Smokeless tobacco: Never  Vaping Use   Vaping Use: Never used  Substance and Sexual Activity   Alcohol use: Yes    Alcohol/week: 2.0 standard drinks    Types: 2 Cans of beer per week    Comment: 6 per day   Drug use: No   Sexual activity: Not on file  Other Topics Concern   Not on file  Social History Narrative   Not on file   Social Determinants of Health   Financial Resource Strain: Not on file  Food Insecurity: Not on file  Transportation Needs: Not on file  Physical Activity: Not on file  Stress: Not on file  Social Connections: Not on file  Intimate Partner Violence: Not on file     Current Outpatient Medications:    ALPRAZolam (XANAX) 0.25 MG tablet, Take 1 tablet (0.25 mg total) by mouth at bedtime as needed for sleep., Disp: 14 tablet, Rfl: 0   aspirin 81 MG EC tablet, Take 81 mg by mouth daily., Disp: , Rfl:    atorvastatin (LIPITOR)  80 MG tablet, Take 1 tablet (80 mg total) by mouth daily., Disp: 90 tablet, Rfl: 3   Cholecalciferol 125 MCG (5000 UT) TABS, Take 5,000 Units by mouth daily., Disp: , Rfl:    cyanocobalamin 1000 MCG tablet, Take 1,000 mcg by mouth daily., Disp: , Rfl:    fluticasone (FLONASE) 50 MCG/ACT nasal spray, Place 2 sprays into both nostrils daily. , Disp: , Rfl:    gabapentin (NEURONTIN) 300 MG capsule, Take 300 mg by mouth 2 (two) times daily., Disp: , Rfl:    hydrochlorothiazide (MICROZIDE) 12.5 MG capsule, Take 12.5 mg by mouth daily., Disp: , Rfl:    isosorbide mononitrate (IMDUR) 60 MG 24 hr tablet, Take 1 tablet (60 mg total) by mouth daily., Disp: 90 tablet, Rfl: 3   LOTENSIN 40 MG tablet, TAKE 1 TABLET DAILY., Disp: 30 tablet, Rfl: 1   melatonin 3 MG TABS tablet, Take 3 mg by mouth at bedtime., Disp: , Rfl:    metoprolol tartrate (LOPRESSOR) 50 MG tablet, Take 1 tablet (50 mg total) by mouth 2  (two) times daily., Disp: 180 tablet, Rfl: 3   naproxen sodium (ALEVE) 220 MG tablet, Take 220-440 mg by mouth See admin instructions. Take 440 mg in the morning and 220 mg at bedtime, Disp: , Rfl:    omeprazole (PRILOSEC OTC) 20 MG tablet, Take 10 mg by mouth daily., Disp: , Rfl:    ranolazine (RANEXA) 1000 MG SR tablet, Take 1 tablet (1,000 mg total) by mouth 2 (two) times daily., Disp: 180 tablet, Rfl: 3   Allergies  Allergen Reactions   Dilaudid [Hydromorphone Hcl] Nausea And Vomiting    ROS Review of Systems  Constitutional: Negative.   HENT: Negative.    Eyes: Negative.   Respiratory: Negative.    Cardiovascular: Negative.   Gastrointestinal: Negative.   Endocrine: Negative.   Genitourinary: Negative.   Musculoskeletal: Negative.   Skin: Negative.   Allergic/Immunologic: Negative.   Neurological: Negative.   Hematological: Negative.   Psychiatric/Behavioral: Negative.    All other systems reviewed and are negative.    Objective:    Physical Exam Vitals reviewed.  Constitutional:      Appearance: Normal appearance.  HENT:     Mouth/Throat:     Mouth: Mucous membranes are moist.  Eyes:     Pupils: Pupils are equal, round, and reactive to light.  Neck:     Vascular: No carotid bruit.  Cardiovascular:     Rate and Rhythm: Normal rate and regular rhythm.     Pulses: Normal pulses.     Heart sounds: Normal heart sounds.  Pulmonary:     Effort: Pulmonary effort is normal.     Breath sounds: Normal breath sounds.  Abdominal:     General: Bowel sounds are normal.     Palpations: Abdomen is soft. There is no hepatomegaly, splenomegaly or mass.     Tenderness: There is no abdominal tenderness.     Hernia: No hernia is present.  Musculoskeletal:     Cervical back: Neck supple.     Right lower leg: No edema.     Left lower leg: No edema.  Skin:    Findings: No rash.  Neurological:     Mental Status: He is alert and oriented to person, place, and time.     Motor:  No weakness.  Psychiatric:        Mood and Affect: Mood normal.        Behavior: Behavior normal.    BP  130/80   Pulse 82   Ht _0  (1.676 m)   Wt 200 lb (90.7 kg)   BMI 32.28 kg/m  Wt Readings from Last 3 Encounters:  01/04/21 200 lb (90.7 kg)  12/11/20 200 lb (90.7 kg)  11/28/20 208 lb (94.3 kg)     Health Maintenance Due  Topic Date Due   Hepatitis C Screening  Never done   TETANUS/TDAP  Never done   Zoster Vaccines- Shingrix (1 of 2) Never done   COLONOSCOPY (Pts 45-60yr Insurance coverage will need to be confirmed)  Never done   PNA vac Low Risk Adult (1 of 2 - PCV13) Never done   COVID-19 Vaccine (4 - Booster for Pfizer series) 08/02/2020    There are no preventive care reminders to display for this patient.  No results found for: TSH Lab Results  Component Value Date   WBC 6.2 09/30/2020   HGB 13.1 09/30/2020   HCT 36.8 (L) 09/30/2020   MCV 99.7 09/30/2020   PLT 197 09/30/2020   Lab Results  Component Value Date   NA 134 09/27/2020   K 4.7 09/27/2020   CO2 22 09/27/2020   GLUCOSE 137 (H) 09/27/2020   BUN 11 09/27/2020   CREATININE 0.84 09/27/2020   BILITOT 1.4 (H) 04/22/2017   ALKPHOS 50 04/22/2017   AST 36 04/22/2017   ALT 17 11/07/2020   PROT 8.6 (H) 04/22/2017   ALBUMIN 4.7 04/22/2017   CALCIUM 9.3 09/27/2020   ANIONGAP 11 06/22/2019   EGFR 93 09/27/2020   Lab Results  Component Value Date   CHOL 104 11/07/2020   Lab Results  Component Value Date   HDL 58 11/07/2020   Lab Results  Component Value Date   LDLCALC 26 11/07/2020   Lab Results  Component Value Date   TRIG 111 11/07/2020   Lab Results  Component Value Date   CHOLHDL 1.8 11/07/2020   No results found for: HGBA1C    Assessment & Plan:   Problem List Items Addressed This Visit       Cardiovascular and Mediastinum   Primary hypertension     Patient denies any chest pain or shortness of breath there is no history of palpitation or paroxysmal nocturnal dyspnea    patient was advised to follow low-salt low-cholesterol diet    ideally I want to keep systolic blood pressure below 130 mmHg, patient was asked to check blood pressure one times a week and give me a report on that.  Patient will be follow-up in 3 months  or earlier as needed, patient will call me back for any change in the cardiovascular symptoms Patient was advised to buy a book from local bookstore concerning blood pressure and read several chapters  every day.  This will be supplemented by some of the material we will give him from the office.  Patient should also utilize other resources like YouTube and Internet to learn more about the blood pressure and the diet.       Angina of effort ([facility]    Patient is stable at the present time.         Respiratory   OSA on CPAP    Patient was advised to continue using CPAP machine.         Digestive   Hepatic steatosis - Primary   Relevant Orders   CBC with Differential/Platelet     Nervous and Auditory   Neuropathy    Stable at the present time patient has stopped  drinking alcohol completely         Other   Class 1 obesity due to excess calories with serious comorbidity and body mass index (BMI) of 32.0 to 32.9 in adult    - I encouraged the patient to lose weight.  - I educated them on making healthy dietary choices including eating more fruits and vegetables and less fried foods. - I encouraged the patient to exercise more, and educated on the benefits of exercise including weight loss, diabetes prevention, and hypertension prevention.   Dietary counseling with a registered dietician  Referral to a weight management support group (e.g. Weight Watchers, Overeaters Anonymous)  If your BMI is greater than 29 or you have gained more than 15 pounds you should work on weight loss.  Attend a healthy cooking class         No orders of the defined types were placed in this encounter.   Follow-up: No follow-ups on file.  Patient  has a low hemoglobin.  We will recheck his CBC, he might have to see a gastroenterologist if is still low.  He does not have any black stool nausea vomiting coughing up blood.  Cletis Athens, MD

## 2021-01-04 NOTE — Assessment & Plan Note (Signed)
Stable at the present time patient has stopped drinking alcohol completely

## 2021-01-04 NOTE — Assessment & Plan Note (Signed)
Patient was advised to continue using CPAP machine.

## 2021-01-04 NOTE — Assessment & Plan Note (Signed)
Patient is stable at the present time.

## 2021-01-06 DIAGNOSIS — Z96652 Presence of left artificial knee joint: Secondary | ICD-10-CM | POA: Diagnosis not present

## 2021-01-06 DIAGNOSIS — M1712 Unilateral primary osteoarthritis, left knee: Secondary | ICD-10-CM | POA: Diagnosis not present

## 2021-01-09 NOTE — Telephone Encounter (Signed)
Pt reports slight pain that is a cross between angina symptoms and what he felt before carpal tunnel symptoms.  Eases off on its own when activity is done.  Has only noticed with the more intense PT exercises at home and at outpt PT.  Worse at outpt PT.   He is exerting himself more now that his knee is replaced so noticing also walking to mailbox, which is quicker than he had been able to do previously.   No assoc SOB as was present with previous cardiac symptoms.  Continues to take Ranexa 1000 mg BID and isosorbide 60 mg.   He is aware we will forward to Dr. Radford Pax for review and call him back with any new recommendations.

## 2021-01-11 ENCOUNTER — Encounter: Payer: Self-pay | Admitting: Internal Medicine

## 2021-01-11 ENCOUNTER — Ambulatory Visit (INDEPENDENT_AMBULATORY_CARE_PROVIDER_SITE_OTHER): Payer: Medicare Other | Admitting: Internal Medicine

## 2021-01-11 ENCOUNTER — Other Ambulatory Visit: Payer: Self-pay

## 2021-01-11 VITALS — BP 138/78 | Temp 98.0°F

## 2021-01-11 DIAGNOSIS — Z9989 Dependence on other enabling machines and devices: Secondary | ICD-10-CM

## 2021-01-11 DIAGNOSIS — Z131 Encounter for screening for diabetes mellitus: Secondary | ICD-10-CM

## 2021-01-11 DIAGNOSIS — G4733 Obstructive sleep apnea (adult) (pediatric): Secondary | ICD-10-CM

## 2021-01-11 DIAGNOSIS — G629 Polyneuropathy, unspecified: Secondary | ICD-10-CM | POA: Diagnosis not present

## 2021-01-11 DIAGNOSIS — R739 Hyperglycemia, unspecified: Secondary | ICD-10-CM | POA: Diagnosis not present

## 2021-01-11 DIAGNOSIS — D519 Vitamin B12 deficiency anemia, unspecified: Secondary | ICD-10-CM

## 2021-01-11 DIAGNOSIS — I10 Essential (primary) hypertension: Secondary | ICD-10-CM

## 2021-01-11 DIAGNOSIS — Z6832 Body mass index (BMI) 32.0-32.9, adult: Secondary | ICD-10-CM

## 2021-01-11 DIAGNOSIS — E6609 Other obesity due to excess calories: Secondary | ICD-10-CM | POA: Diagnosis not present

## 2021-01-11 DIAGNOSIS — D508 Other iron deficiency anemias: Secondary | ICD-10-CM | POA: Diagnosis not present

## 2021-01-11 NOTE — Assessment & Plan Note (Signed)

## 2021-01-11 NOTE — Assessment & Plan Note (Signed)
Stable at the present time. 

## 2021-01-11 NOTE — Progress Notes (Signed)
Established Patient Office Visit  Subjective:  Patient ID: Daniel Holden, male    DOB: 26-May-1946  Age: 75 y.o. MRN: [id number]  CC:  Chief Complaint  Patient presents with   lab results    HPI  Daniel Holden presents for tiredness and anemia  follow up  Past Medical History:  Diagnosis Date   Allergy    Angina of effort ([place])    Anxiety    CAD (coronary artery disease), native coronary artery    coronary CTA showing aortic atherosclerosis with very high coronary Ca score at 4630 with severely calcified RCA and LAD > 70% and 50-69% prox to mid LCx.     Carpal tunnel syndrome    Hypertension    Lung nodule    Obesity    Pancreatitis 2015   Sleep apnea     Past Surgical History:  Procedure Laterality Date   APPENDECTOMY     Patient states he did not have an appendectomy and does not know why this is in his chart. [place] 06/28/2020   CARPAL TUNNEL RELEASE     KNEE ARTHROSCOPY WITH MEDIAL MENISECTOMY Left 06/25/2019   Procedure: KNEE ARTHROSCOPY WITH MEDIAL MENISECTOMY;  Surgeon: Thornton Park, MD;  Location: [facility];  Service: Orthopedics;  Laterality: Left;   LEFT HEART CATH AND CORONARY ANGIOGRAPHY N/A 10/04/2020   Procedure: LEFT HEART CATH AND CORONARY ANGIOGRAPHY;  Surgeon: Leonie Man, MD;  Location: [facility];  Service: Cardiovascular;  Laterality: N/A;   SHOULDER SURGERY     TONSILLECTOMY      Family History  Problem Relation Age of Onset   CVA Father     Social History   Socioeconomic History   Marital status: Married    Spouse name: Not on file   Number of children: Not on file   Years of education: Not on file   Highest education level: Not on file  Occupational History   Not on file  Tobacco Use   Smoking status: Never   Smokeless tobacco: Never  Vaping Use   Vaping Use: Never used  Substance and Sexual Activity   Alcohol use: Yes    Alcohol/week: 2.0 standard drinks    Types: 2 Cans of beer per week    Comment: 6 per day    Drug use: No   Sexual activity: Not on file  Other Topics Concern   Not on file  Social History Narrative   Not on file   Social Determinants of Health   Financial Resource Strain: Not on file  Food Insecurity: Not on file  Transportation Needs: Not on file  Physical Activity: Not on file  Stress: Not on file  Social Connections: Not on file  Intimate Partner Violence: Not on file     Current Outpatient Medications:    ALPRAZolam (XANAX) 0.25 MG tablet, Take 1 tablet (0.25 mg total) by mouth at bedtime as needed for sleep., Disp: 14 tablet, Rfl: 0   aspirin 81 MG EC tablet, Take 81 mg by mouth daily., Disp: , Rfl:    atorvastatin (LIPITOR) 80 MG tablet, Take 1 tablet (80 mg total) by mouth daily., Disp: 90 tablet, Rfl: 3   Cholecalciferol 125 MCG (5000 UT) TABS, Take 5,000 Units by mouth daily., Disp: , Rfl:    cyanocobalamin 1000 MCG tablet, Take 1,000 mcg by mouth daily., Disp: , Rfl:    fluticasone (FLONASE) 50 MCG/ACT nasal spray, Place 2 sprays into both nostrils daily. , Disp: ,  Rfl:    gabapentin (NEURONTIN) 300 MG capsule, Take 300 mg by mouth 2 (two) times daily., Disp: , Rfl:    hydrochlorothiazide (MICROZIDE) 12.5 MG capsule, Take 12.5 mg by mouth daily., Disp: , Rfl:    isosorbide mononitrate (IMDUR) 60 MG 24 hr tablet, Take 1 tablet (60 mg total) by mouth daily., Disp: 90 tablet, Rfl: 3   LOTENSIN 40 MG tablet, TAKE 1 TABLET DAILY., Disp: 30 tablet, Rfl: 1   melatonin 3 MG TABS tablet, Take 3 mg by mouth at bedtime., Disp: , Rfl:    metoprolol tartrate (LOPRESSOR) 50 MG tablet, Take 1 tablet (50 mg total) by mouth 2 (two) times daily., Disp: 180 tablet, Rfl: 3   omeprazole (PRILOSEC OTC) 20 MG tablet, Take 10 mg by mouth daily., Disp: , Rfl:    ranolazine (RANEXA) 1000 MG SR tablet, Take 1 tablet (1,000 mg total) by mouth 2 (two) times daily., Disp: 180 tablet, Rfl: 3   naproxen sodium (ALEVE) 220 MG tablet, Take 220-440 mg by mouth See admin instructions. Take 440  mg in the morning and 220 mg at bedtime, Disp: , Rfl:    Allergies  Allergen Reactions   Dilaudid [Hydromorphone Hcl] Nausea And Vomiting    ROS Review of Systems  Constitutional:  Positive for fatigue.  HENT: Negative.  Negative for congestion.   Eyes: Negative.   Respiratory: Negative.    Cardiovascular: Negative.  Negative for chest pain.  Gastrointestinal: Negative.  Negative for abdominal distention, anal bleeding, blood in stool and constipation.  Endocrine: Negative.   Genitourinary: Negative.  Negative for hematuria.  Musculoskeletal: Negative.   Skin: Negative.   Allergic/Immunologic: Negative.   Neurological: Negative.   Hematological: Negative.   Psychiatric/Behavioral: Negative.  Negative for confusion.   All other systems reviewed and are negative.    Objective:    Physical Exam Vitals reviewed.  Constitutional:      Appearance: Normal appearance.  HENT:     Mouth/Throat:     Mouth: Mucous membranes are moist.  Eyes:     Pupils: Pupils are equal, round, and reactive to light.  Neck:     Vascular: No carotid bruit.  Cardiovascular:     Rate and Rhythm: Normal rate and regular rhythm.     Pulses: Normal pulses.     Heart sounds: Normal heart sounds.  Pulmonary:     Effort: Pulmonary effort is normal.     Breath sounds: Normal breath sounds.  Abdominal:     General: Bowel sounds are normal.     Palpations: Abdomen is soft. There is no hepatomegaly, splenomegaly or mass.     Tenderness: There is no abdominal tenderness.     Hernia: No hernia is present.  Musculoskeletal:     Cervical back: Neck supple.     Right lower leg: No edema.     Left lower leg: No edema.  Skin:    Findings: No rash.  Neurological:     Mental Status: He is alert and oriented to person, place, and time.     Motor: No weakness.  Psychiatric:        Mood and Affect: Mood normal.        Behavior: Behavior normal.    There were no vitals taken for this visit. Wt Readings  from Last 3 Encounters:  01/04/21 200 lb (90.7 kg)  12/11/20 200 lb (90.7 kg)  11/28/20 208 lb (94.3 kg)     Health Maintenance Due  Topic Date Due  Hepatitis C Screening  Never done   TETANUS/TDAP  Never done   COLONOSCOPY (Pts 45-51yr Insurance coverage will need to be confirmed)  Never done   PNA vac Low Risk Adult (1 of 2 - PCV13) Never done   Zoster Vaccines- Shingrix (2 of 2) 12/29/2013   COVID-19 Vaccine (4 - Booster for Pfizer series) 08/02/2020    There are no preventive care reminders to display for this patient.  No results found for: TSH Lab Results  Component Value Date   WBC 5.1 01/04/2021   HGB 10.5 (L) 01/04/2021   HCT 31.7 (L) 01/04/2021   MCV 105.3 (H) 01/04/2021   PLT 284 01/04/2021   Lab Results  Component Value Date   NA 134 09/27/2020   K 4.7 09/27/2020   CO2 22 09/27/2020   GLUCOSE 137 (H) 09/27/2020   BUN 11 09/27/2020   CREATININE 0.84 09/27/2020   BILITOT 1.4 (H) 04/22/2017   ALKPHOS 50 04/22/2017   AST 36 04/22/2017   ALT 17 11/07/2020   PROT 8.6 (H) 04/22/2017   ALBUMIN 4.7 04/22/2017   CALCIUM 9.3 09/27/2020   ANIONGAP 11 06/22/2019   EGFR 93 09/27/2020   Lab Results  Component Value Date   CHOL 104 11/07/2020   Lab Results  Component Value Date   HDL 58 11/07/2020   Lab Results  Component Value Date   LDLCALC 26 11/07/2020   Lab Results  Component Value Date   TRIG 111 11/07/2020   Lab Results  Component Value Date   CHOLHDL 1.8 11/07/2020   No results found for: HGBA1C    Assessment & Plan:   Problem List Items Addressed This Visit       Cardiovascular and Mediastinum   Primary hypertension - Primary     Patient denies any chest pain or shortness of breath there is no history of palpitation or paroxysmal nocturnal dyspnea   patient was advised to follow low-salt low-cholesterol diet    ideally I want to keep systolic blood pressure below 130 mmHg, patient was asked to check blood pressure one times a week  and give me a report on that.  Patient will be follow-up in 3 months  or earlier as needed, patient will call me back for any change in the cardiovascular symptoms Patient was advised to buy a book from local bookstore concerning blood pressure and read several chapters  every day.  This will be supplemented by some of the material we will give him from the office.  Patient should also utilize other resources like YouTube and Internet to learn more about the blood pressure and the diet.         Respiratory   OSA on CPAP    Stable at the present time         Nervous and Auditory   Neuropathy    Stable         Other   Class 1 obesity due to excess calories with serious comorbidity and body mass index (BMI) of 32.0 to 32.9 in adult    - I encouraged the patient to lose weight.  - I educated them on making healthy dietary choices including eating more fruits and vegetables and less fried foods. - I encouraged the patient to exercise more, and educated on the benefits of exercise including weight loss, diabetes prevention, and hypertension prevention.   Dietary counseling with a registered dietician  Referral to a weight management support group (e.g. Weight Watchers, Overeaters Anonymous)  If  your BMI is greater than 29 or you have gained more than 15 pounds you should work on weight loss.  Attend a healthy cooking class        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 intake    We will repeat CBC, ferritin level iron binding capacity serum iron B12 level and hemoglobin AIC.        No orders of the defined types were placed in this encounter.   Follow-up: No follow-ups on file.    Cletis Athens, MD

## 2021-01-11 NOTE — Assessment & Plan Note (Signed)
Stable

## 2021-01-11 NOTE — Assessment & Plan Note (Signed)

## 2021-01-11 NOTE — Assessment & Plan Note (Signed)
We will repeat CBC, ferritin level iron binding capacity serum iron B12 level and hemoglobin AIC.

## 2021-01-12 DIAGNOSIS — Z131 Encounter for screening for diabetes mellitus: Secondary | ICD-10-CM | POA: Diagnosis not present

## 2021-01-12 DIAGNOSIS — G629 Polyneuropathy, unspecified: Secondary | ICD-10-CM | POA: Diagnosis not present

## 2021-01-12 DIAGNOSIS — D508 Other iron deficiency anemias: Secondary | ICD-10-CM | POA: Diagnosis not present

## 2021-01-12 DIAGNOSIS — R739 Hyperglycemia, unspecified: Secondary | ICD-10-CM | POA: Diagnosis not present

## 2021-01-12 MED ORDER — ISOSORBIDE MONONITRATE ER 60 MG PO TB24: 90.0000 mg | ORAL_TABLET | Freq: Every day | ORAL | 3 refills | Status: DC

## 2021-01-12 NOTE — Telephone Encounter (Signed)
Spoke with the patient and he will increase Imdur to 90 mg daily. Follow up appointment has been scheduled.

## 2021-01-13 ENCOUNTER — Other Ambulatory Visit: Payer: Self-pay

## 2021-01-13 ENCOUNTER — Ambulatory Visit
Admission: RE | Admit: 2021-01-13 | Discharge: 2021-01-13 | Disposition: A | Discharge status: Home / Self Care | Payer: Medicare Other | Source: Ambulatory Visit | Attending: Specialist | Admitting: Specialist

## 2021-01-13 DIAGNOSIS — G4733 Obstructive sleep apnea (adult) (pediatric): Secondary | ICD-10-CM

## 2021-01-13 DIAGNOSIS — M1712 Unilateral primary osteoarthritis, left knee: Secondary | ICD-10-CM | POA: Diagnosis not present

## 2021-01-13 DIAGNOSIS — J849 Interstitial pulmonary disease, unspecified: Secondary | ICD-10-CM | POA: Diagnosis not present

## 2021-01-13 DIAGNOSIS — Z9989 Dependence on other enabling machines and devices: Secondary | ICD-10-CM | POA: Diagnosis not present

## 2021-01-13 DIAGNOSIS — I700 Atherosclerosis of aorta: Secondary | ICD-10-CM | POA: Diagnosis not present

## 2021-01-13 DIAGNOSIS — R911 Solitary pulmonary nodule: Secondary | ICD-10-CM | POA: Diagnosis not present

## 2021-01-13 DIAGNOSIS — Z96652 Presence of left artificial knee joint: Secondary | ICD-10-CM | POA: Diagnosis not present

## 2021-01-17 DIAGNOSIS — Z96652 Presence of left artificial knee joint: Secondary | ICD-10-CM | POA: Diagnosis not present

## 2021-01-17 DIAGNOSIS — M1712 Unilateral primary osteoarthritis, left knee: Secondary | ICD-10-CM | POA: Diagnosis not present

## 2021-01-17 NOTE — Addendum Note (Signed)
Addended by: Lacretia Nicks L on: 01/17/2021 11:06 AM   Modules accepted: Orders

## 2021-01-18 ENCOUNTER — Ambulatory Visit: Payer: Medicare Other | Admitting: Internal Medicine

## 2021-01-18 DIAGNOSIS — D519 Vitamin B12 deficiency anemia, unspecified: Secondary | ICD-10-CM | POA: Diagnosis not present

## 2021-01-19 DIAGNOSIS — R918 Other nonspecific abnormal finding of lung field: Secondary | ICD-10-CM | POA: Diagnosis not present

## 2021-01-19 DIAGNOSIS — Z6832 Body mass index (BMI) 32.0-32.9, adult: Secondary | ICD-10-CM | POA: Diagnosis not present

## 2021-01-19 DIAGNOSIS — G4733 Obstructive sleep apnea (adult) (pediatric): Secondary | ICD-10-CM | POA: Diagnosis not present

## 2021-01-19 DIAGNOSIS — E6609 Other obesity due to excess calories: Secondary | ICD-10-CM | POA: Diagnosis not present

## 2021-01-19 DIAGNOSIS — J439 Emphysema, unspecified: Secondary | ICD-10-CM | POA: Diagnosis not present

## 2021-01-19 LAB — CBC WITH DIFFERENTIAL/PLATELET
Basophils Absolute: 0.1 10*3/uL (ref 0.0–0.2)
Basos: 1 %
EOS (ABSOLUTE): 0.1 10*3/uL (ref 0.0–0.4)
Eos: 2 %
Hematocrit: 31.2 % — ABNORMAL LOW (ref 37.5–51.0)
Hemoglobin: 10.5 g/dL — ABNORMAL LOW (ref 13.0–17.7)
Immature Grans (Abs): 0 10*3/uL (ref 0.0–0.1)
Immature Granulocytes: 0 %
Lymphocytes Absolute: 1.3 10*3/uL (ref 0.7–3.1)
Lymphs: 22 %
MCH: 34.4 pg — ABNORMAL HIGH (ref 26.6–33.0)
MCHC: 33.7 g/dL (ref 31.5–35.7)
MCV: 102 fL — ABNORMAL HIGH (ref 79–97)
Monocytes Absolute: 0.8 10*3/uL (ref 0.1–0.9)
Monocytes: 13 %
Neutrophils Absolute: 3.8 10*3/uL (ref 1.4–7.0)
Neutrophils: 62 %
Platelets: 281 10*3/uL (ref 150–450)
RBC: 3.05 x10E6/uL — ABNORMAL LOW (ref 4.14–5.80)
RDW: 11.8 % (ref 11.6–15.4)
WBC: 6.1 10*3/uL (ref 3.4–10.8)

## 2021-01-20 DIAGNOSIS — Z96652 Presence of left artificial knee joint: Secondary | ICD-10-CM | POA: Diagnosis not present

## 2021-01-20 DIAGNOSIS — M1712 Unilateral primary osteoarthritis, left knee: Secondary | ICD-10-CM | POA: Diagnosis not present

## 2021-01-23 ENCOUNTER — Other Ambulatory Visit: Payer: Self-pay

## 2021-01-23 ENCOUNTER — Ambulatory Visit (INDEPENDENT_AMBULATORY_CARE_PROVIDER_SITE_OTHER): Payer: Medicare Other | Admitting: Internal Medicine

## 2021-01-23 ENCOUNTER — Encounter: Payer: Self-pay | Admitting: Internal Medicine

## 2021-01-23 VITALS — BP 125/58 | HR 58 | Ht 66.0 in | Wt 196.8 lb

## 2021-01-23 DIAGNOSIS — I208 Other forms of angina pectoris: Secondary | ICD-10-CM | POA: Diagnosis not present

## 2021-01-23 DIAGNOSIS — E861 Hypovolemia: Secondary | ICD-10-CM | POA: Diagnosis not present

## 2021-01-23 DIAGNOSIS — I9589 Other hypotension: Secondary | ICD-10-CM

## 2021-01-23 DIAGNOSIS — Z9989 Dependence on other enabling machines and devices: Secondary | ICD-10-CM | POA: Diagnosis not present

## 2021-01-23 DIAGNOSIS — G4733 Obstructive sleep apnea (adult) (pediatric): Secondary | ICD-10-CM

## 2021-01-23 DIAGNOSIS — I10 Essential (primary) hypertension: Secondary | ICD-10-CM | POA: Diagnosis not present

## 2021-01-23 DIAGNOSIS — G629 Polyneuropathy, unspecified: Secondary | ICD-10-CM | POA: Diagnosis not present

## 2021-01-23 NOTE — Assessment & Plan Note (Signed)
Patient was advised to stop the muscle relaxant

## 2021-01-23 NOTE — Telephone Encounter (Signed)
Spoke with the patient who states that he had a couple episodes of low blood pressure over the weekend. He states that he was lightheaded. He had a salty snack, drank some fluids, and laid down. He states that episodes lasted a couple hours and then resolved.  He called his PCP and was able to be seen today.  He was advised to discontinue HCTZ, to start taking his lotensin in the evenings, and only take 1/2 tablet of muscle relaxer that he was prescribed after recent surgery.  Patient states that he is feeling good today and blood pressure has been normal.  Advised to continue to monitor and let us know if BP drops again.

## 2021-01-23 NOTE — Assessment & Plan Note (Addendum)
Patient had episode of hypotension noted by him I asked him to stop hydrochlorothiazide and take his lotensin at night

## 2021-01-23 NOTE — Assessment & Plan Note (Signed)
Patient was advised to stop hydrochlorothiazide and also advised to stop Zanaflex

## 2021-01-23 NOTE — Telephone Encounter (Signed)
Daniel Holden is returning [place] call. Best contact number for callback is [phone]. Please advise.

## 2021-01-23 NOTE — Progress Notes (Signed)
Established Patient Office Visit  Subjective:  Patient ID: Daniel Holden, male    DOB: 09/08/1945  Age: 75 y.o. MRN: [id number]  CC:  Chief Complaint  Patient presents with  . Hypotension    Patient has had multiple episodes where bp dropped extremely low. Patient is concerned that he is taking too much medication.    Hypertension This is a chronic problem. Pertinent negatives include no anxiety. There are no associated agents to hypertension. Risk factors for coronary artery disease include male gender and obesity. Past treatments include beta blockers and lifestyle changes. The current treatment provides mild improvement.   Daniel Holden presents for low bp  Past Medical History:  Diagnosis Date  . Allergy   . 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 Anxiety   . CAD (coronary artery disease), native coronary artery    coronary CTA showing aortic atherosclerosis with very high coronary Ca score at 4630 with severely calcified RCA and LAD > 70% and 50-69% prox to mid LCx.    . Carpal tunnel syndrome   . Hypertension   . Lung nodule   . Obesity   . Pancreatitis 2015  . Sleep apnea     Past Surgical History:  Procedure Laterality Date  . APPENDECTOMY     Patient states he did not have an appendectomy and does not know why this is in his chart. [facility] 06/28/2020  . CARPAL TUNNEL RELEASE    . Holden ARTHROSCOPY WITH MEDIAL MENISECTOMY Left 06/25/2019   Procedure: Holden ARTHROSCOPY WITH MEDIAL MENISECTOMY;  Surgeon: Thornton Park, MD;  Location: [facility] ORS;  Service: Orthopedics;  Laterality: Left;  . LEFT HEART CATH AND CORONARY ANGIOGRAPHY N/A 10/04/2020   Procedure: LEFT HEART CATH AND CORONARY ANGIOGRAPHY;  Surgeon: Leonie Man, MD;  Location: [place] [facility];  Service: Cardiovascular;  Laterality: N/A;  . SHOULDER SURGERY    . TONSILLECTOMY      Family History  Problem Relation Age of Onset  . CVA Father     Social History   Socioeconomic History  . Marital status:  Married    Spouse name: Not on file  . Number of children: Not on file  . Years of education: Not on file  . Highest education level: Not on file  Occupational History  . Not on file  Tobacco Use  . Smoking status: Never  . Smokeless tobacco: Never  Vaping Use  . Vaping Use: Never used  Substance and Sexual Activity  . Alcohol use: Yes    Alcohol/week: 2.0 standard drinks    Types: 2 Cans of beer per week    Comment: 6 per day  . Drug use: No  . Sexual activity: Not on file  Other Topics Concern  . Not on file  Social History Narrative  . Not on file   Social Determinants of Health   Financial Resource Strain: Not on file  Food Insecurity: Not on file  Transportation Needs: Not on file  Physical Activity: Not on file  Stress: Not on file  Social Connections: Not on file  Intimate Partner Violence: Not on file     Current Outpatient Medications:  .  ALPRAZolam (XANAX) 0.25 MG tablet, Take 1 tablet (0.25 mg total) by mouth at bedtime as needed for sleep., Disp: 14 tablet, Rfl: 0 .  aspirin 81 MG EC tablet, Take 81 mg by mouth daily., Disp: , Rfl:  .  atorvastatin (LIPITOR) 80 MG tablet, Take 1 tablet (80 mg  total) by mouth daily., Disp: 90 tablet, Rfl: 3 .  Cholecalciferol 125 MCG (5000 UT) TABS, Take 5,000 Units by mouth daily., Disp: , Rfl:  .  cyanocobalamin 1000 MCG tablet, Take 1,000 mcg by mouth daily., Disp: , Rfl:  .  fluticasone (FLONASE) 50 MCG/ACT nasal spray, Place 2 sprays into both nostrils daily. , Disp: , Rfl:  .  gabapentin (NEURONTIN) 300 MG capsule, Take 300 mg by mouth 2 (two) times daily., Disp: , Rfl:  .  hydrochlorothiazide (MICROZIDE) 12.5 MG capsule, Take 12.5 mg by mouth daily., Disp: , Rfl:  .  isosorbide mononitrate (IMDUR) 60 MG 24 hr tablet, Take 1.5 tablets (90 mg total) by mouth daily., Disp: 135 tablet, Rfl: 3 .  LOTENSIN 40 MG tablet, TAKE 1 TABLET DAILY., Disp: 30 tablet, Rfl: 1 .  melatonin 3 MG TABS tablet, Take 3 mg by mouth at  bedtime., Disp: , Rfl:  .  metoprolol tartrate (LOPRESSOR) 50 MG tablet, Take 1 tablet (50 mg total) by mouth 2 (two) times daily., Disp: 180 tablet, Rfl: 3 .  omeprazole (PRILOSEC OTC) 20 MG tablet, Take 10 mg by mouth daily., Disp: , Rfl:  .  ranolazine (RANEXA) 1000 MG SR tablet, Take 1 tablet (1,000 mg total) by mouth 2 (two) times daily., Disp: 180 tablet, Rfl: 3   Allergies  Allergen Reactions  . Dilaudid [Hydromorphone Hcl] Nausea And Vomiting    ROS Review of Systems  Constitutional: Negative.   HENT: Negative.    Eyes: Negative.   Respiratory: Negative.    Cardiovascular: Negative.   Gastrointestinal: Negative.   Endocrine: Negative.   Genitourinary: Negative.   Musculoskeletal: Negative.   Skin: Negative.   Allergic/Immunologic: Negative.   Neurological: Negative.   Hematological: Negative.   Psychiatric/Behavioral: Negative.    All other systems reviewed and are negative.    Objective:    Physical Exam Vitals reviewed.  Constitutional:      Appearance: Normal appearance.  HENT:     Mouth/Throat:     Mouth: Mucous membranes are moist.  Eyes:     Pupils: Pupils are equal, round, and reactive to light.  Neck:     Vascular: No carotid bruit.  Cardiovascular:     Rate and Rhythm: Normal rate and regular rhythm.     Pulses: Normal pulses.     Heart sounds: Normal heart sounds.  Pulmonary:     Effort: Pulmonary effort is normal.     Breath sounds: Normal breath sounds.  Abdominal:     General: Bowel sounds are normal.     Palpations: Abdomen is soft. There is no hepatomegaly, splenomegaly or mass.     Tenderness: There is no abdominal tenderness.     Hernia: No hernia is present.  Musculoskeletal:     Cervical back: Neck supple.     Right lower leg: No edema.     Left lower leg: No edema.  Skin:    Findings: No rash.  Neurological:     Mental Status: He is alert and oriented to person, place, and time.     Motor: No weakness.  Psychiatric:         Mood and Affect: Mood normal.        Behavior: Behavior normal.    BP (!) 125/58   Pulse (!) 58   Ht _0  (1.676 m)   Wt 196 lb 12.8 oz (89.3 kg)   BMI 31.76 kg/m  Wt Readings from Last 3 Encounters:  01/23/21 196 lb  12.8 oz (89.3 kg)  01/04/21 200 lb (90.7 kg)  12/11/20 200 lb (90.7 kg)     Health Maintenance Due  Topic Date Due  . Hepatitis C Screening  Never done  . TETANUS/TDAP  Never done  . COLONOSCOPY (Pts 45-63yr Insurance coverage will need to be confirmed)  Never done  . PNA vac Low Risk Adult (1 of 2 - PCV13) Never done  . Zoster Vaccines- Shingrix (2 of 2) 12/29/2013  . COVID-19 Vaccine (4 - Booster for Pfizer series) 08/02/2020  . INFLUENZA VACCINE  01/23/2021    There are no preventive care reminders to display for this patient.  No results found for: TSH Lab Results  Component Value Date   WBC 6.1 01/18/2021   HGB 10.5 (L) 01/18/2021   HCT 31.2 (L) 01/18/2021   MCV 102 (H) 01/18/2021   PLT 281 01/18/2021   Lab Results  Component Value Date   NA 134 09/27/2020   K 4.7 09/27/2020   CO2 22 09/27/2020   GLUCOSE 137 (H) 09/27/2020   BUN 11 09/27/2020   CREATININE 0.84 09/27/2020   BILITOT 1.4 (H) 04/22/2017   ALKPHOS 50 04/22/2017   AST 36 04/22/2017   ALT 17 11/07/2020   PROT 8.6 (H) 04/22/2017   ALBUMIN 4.7 04/22/2017   CALCIUM 9.3 09/27/2020   ANIONGAP 11 06/22/2019   EGFR 93 09/27/2020   Lab Results  Component Value Date   CHOL 104 11/07/2020   Lab Results  Component Value Date   HDL 58 11/07/2020   Lab Results  Component Value Date   LDLCALC 26 11/07/2020   Lab Results  Component Value Date   TRIG 111 11/07/2020   Lab Results  Component Value Date   CHOLHDL 1.8 11/07/2020   No results found for: HGBA1C    Assessment & Plan:   Problem List Items Addressed This Visit       Cardiovascular and Mediastinum   Primary hypertension    Patient had episode of hypotension noted by him I asked him to stop hydrochlorothiazide  and take his lotensin at night       Hypotension due to hypovolemia    Patient was advised to stop hydrochlorothiazide and also advised to stop Zanaflex         Respiratory   OSA on CPAP - Primary    Stable at the present time         Nervous and Auditory   Neuropathy    Patient was advised to stop the muscle relaxant         No orders of the defined types were placed in this encounter.   Follow-up: No follow-ups on file.    JCletis Athens MD

## 2021-01-23 NOTE — Telephone Encounter (Signed)
Left message for patient to call back  

## 2021-01-23 NOTE — Assessment & Plan Note (Signed)
Stable at the present time. 

## 2021-01-25 DIAGNOSIS — M1712 Unilateral primary osteoarthritis, left knee: Secondary | ICD-10-CM | POA: Diagnosis not present

## 2021-01-25 DIAGNOSIS — Z96652 Presence of left artificial knee joint: Secondary | ICD-10-CM | POA: Diagnosis not present

## 2021-01-27 DIAGNOSIS — M1712 Unilateral primary osteoarthritis, left knee: Secondary | ICD-10-CM | POA: Diagnosis not present

## 2021-01-27 DIAGNOSIS — Z96652 Presence of left artificial knee joint: Secondary | ICD-10-CM | POA: Diagnosis not present

## 2021-01-30 DIAGNOSIS — Z96652 Presence of left artificial knee joint: Secondary | ICD-10-CM | POA: Diagnosis not present

## 2021-01-30 DIAGNOSIS — M1712 Unilateral primary osteoarthritis, left knee: Secondary | ICD-10-CM | POA: Diagnosis not present

## 2021-02-06 ENCOUNTER — Ambulatory Visit: Payer: Medicare Other | Admitting: Internal Medicine

## 2021-02-09 LAB — BASIC METABOLIC PANEL
BUN/Creatinine Ratio: 15 (ref 10–24)
BUN: 13 mg/dL (ref 8–27)
CO2: 20 mmol/L (ref 20–29)
Calcium: 9.5 mg/dL (ref 8.6–10.2)
Chloride: 92 mmol/L — ABNORMAL LOW (ref 96–106)
Creatinine, Ser: 0.88 mg/dL (ref 0.76–1.27)
Glucose: 140 mg/dL — ABNORMAL HIGH (ref 65–99)
Potassium: 4.5 mmol/L (ref 3.5–5.2)
Sodium: 131 mmol/L — ABNORMAL LOW (ref 134–144)
eGFR: 91 mL/min/{1.73_m2} (ref >59)

## 2021-02-09 LAB — SPECIMEN STATUS REPORT

## 2021-02-15 DIAGNOSIS — H2513 Age-related nuclear cataract, bilateral: Secondary | ICD-10-CM | POA: Diagnosis not present

## 2021-02-16 ENCOUNTER — Ambulatory Visit (INDEPENDENT_AMBULATORY_CARE_PROVIDER_SITE_OTHER): Payer: Medicare Other | Admitting: Family

## 2021-02-16 ENCOUNTER — Other Ambulatory Visit: Payer: Self-pay

## 2021-02-16 ENCOUNTER — Encounter (HOSPITAL_BASED_OUTPATIENT_CLINIC_OR_DEPARTMENT_OTHER): Payer: Self-pay | Admitting: Family

## 2021-02-16 VITALS — BP 122/60 | HR 58 | Ht 66.0 in | Wt 196.6 lb

## 2021-02-16 DIAGNOSIS — I25118 Atherosclerotic heart disease of native coronary artery with other forms of angina pectoris: Secondary | ICD-10-CM | POA: Diagnosis not present

## 2021-02-16 DIAGNOSIS — E785 Hyperlipidemia, unspecified: Secondary | ICD-10-CM

## 2021-02-16 DIAGNOSIS — I10 Essential (primary) hypertension: Secondary | ICD-10-CM

## 2021-02-16 MED ORDER — BENAZEPRIL HCL 20 MG PO TABS
20.0000 mg | ORAL_TABLET | Freq: Every day | ORAL | 3 refills | Status: DC
Start: 2021-02-16 — End: 2021-03-15

## 2021-02-16 NOTE — Progress Notes (Signed)
Office Visit    Patient Name: Daniel Holden Date of Encounter: 02/16/2021  PCP:  Cletis Athens, MD   [place]  Cardiologist:  Fransico Him, MD  Advanced Practice Provider:  No care team member to display Electrophysiologist:  None   Chief Complaint    Daniel FERRALES is a 75 y.o. 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     Carpal tunnel syndrome    Hypertension    Lung nodule    Obesity    Pancreatitis 2015   Sleep apnea    Past Surgical History:  Procedure Laterality Date   APPENDECTOMY     Patient states he did not have an appendectomy and does not know why this is in his chart. [place] 06/28/2020   CARPAL TUNNEL RELEASE     KNEE ARTHROSCOPY WITH MEDIAL MENISECTOMY Left 06/25/2019   Procedure: KNEE ARTHROSCOPY WITH MEDIAL MENISECTOMY;  Surgeon: Thornton Park, MD;  Location: [facility];  Service: Orthopedics;  Laterality: Left;   LEFT HEART CATH AND CORONARY ANGIOGRAPHY N/A 10/04/2020   Procedure: LEFT HEART CATH AND CORONARY ANGIOGRAPHY;  Surgeon: Leonie Man, MD;  Location: [place] [facility];  Service: Cardiovascular;  Laterality: N/A;   SHOULDER SURGERY     TONSILLECTOMY      Allergies  Allergies  Allergen Reactions   Dilaudid [Hydromorphone Hcl] Nausea And Vomiting    History of Present Illness    [name] is a 75 y.o. male with a hx of anxiety, hypertension, obesity, coronary artery disease last seen 11/28/20 by Dr. Radford Pax.  Summer 2021 he started waking in middle of the night with severe pains.  Exercise tolerance  test was normal.  He was diagnosed with severe carpal tunnel and underwent right carpal tunnel repair.  Extensive work-up with MRI of spine and started on gabapentin with no improvement.  He then started developing chest pain with exertion.  Coronary CTA with high calcium score 4630 with severely calcified RCA and LAD. Cardiac cath 10/04/20 severe single vessel CAD with occluded RCA with extensive calcifications and multiple different lesions including 90% proxm and distal lesions, 50% oLCx and 50-60% dLCx and 40% pLAD with heavily calcified vessels with bridging collaterals from the septal perforator and diagonal to the PDA. Medical management was recommended. He was started on Ranexa. Imdur has since been initiated. It was increased via phone message 01/22/21.Daniel Holden  Presents today for follow up with his wife.  Was having episodes of hypotension and primary care stopped hydrochlorothiazide.  He has had no recurrent episodes.  He is established with a new primary care to his previous one nearing retirement and has an appointment with Dr. Tressia Miners of [facility] in November. He has an appointment next month with gastroenterology for follow-up of anemia and overdue colonoscopy.  He notes with walking to his mailbox he will get pain in his bilateral arms with motion.  He does exercise on a bicycle 15 minutes/day without chest pain, pressure, tightness, arm pain.  We discussed  that his arm pain is not likely related to angina as it does not occur with all exertion.  Reports no dyspnea on exertion.  Over the last 2 weeks he has been more ambulatory after knee surgery. He has completed PT for his knee.   EKGs/Labs/Other Studies Reviewed:   The following studies were reviewed today:   EKG:  No EKG today  Recent Labs: 11/07/2020: ALT 17 01/12/2021: BUN 13; Creatinine, Ser 0.88; Potassium 4.5; Sodium 131 01/18/2021: Hemoglobin 10.5; Platelets 281  Recent Lipid Panel    Component Value Date/Time   CHOL 104  11/07/2020 0741   CHOL 83 09/27/2013 0319   TRIG 111 11/07/2020 0741   TRIG 188 09/27/2013 0319   HDL 58 11/07/2020 0741   HDL 28 (L) 09/27/2013 0319   CHOLHDL 1.8 11/07/2020 0741   VLDL 38 09/27/2013 0319   LDLCALC 26 11/07/2020 0741   LDLCALC 17 09/27/2013 0319   Home Medications   Current Meds  Medication Sig   ALPRAZolam (XANAX) 0.25 MG tablet Take 1 tablet (0.25 mg total) by mouth at bedtime as needed for sleep.   aspirin 81 MG EC tablet Take 81 mg by mouth daily.   atorvastatin (LIPITOR) 80 MG tablet Take 1 tablet (80 mg total) by mouth daily.   Cholecalciferol 125 MCG (5000 UT) TABS Take 5,000 Units by mouth daily.   cyanocobalamin 1000 MCG tablet Take 1,000 mcg by mouth daily.   fluticasone (FLONASE) 50 MCG/ACT nasal spray Place 2 sprays into both nostrils daily.    isosorbide mononitrate (IMDUR) 60 MG 24 hr tablet Take 1.5 tablets (90 mg total) by mouth daily.   LOTENSIN 40 MG tablet TAKE 1 TABLET DAILY. (Patient taking differently: Take 20 mg by mouth daily.)   melatonin 3 MG TABS tablet Take 10 mg by mouth at bedtime.   metoprolol tartrate (LOPRESSOR) 50 MG tablet Take 1 tablet (50 mg total) by mouth 2 (two) times daily.   omeprazole (PRILOSEC OTC) 20 MG tablet Take 10 mg by mouth daily.   ranolazine (RANEXA) 1000 MG SR tablet Take 1 tablet (1,000 mg total) by mouth 2 (two) times daily.     Review of Systems      All other systems reviewed and are otherwise negative except as noted above.  Physical Exam    VS:  BP 122/60   Pulse (!) 58   Ht '5\' 6"'$  (1.676 m)   Wt 196 lb 9.6 oz (89.2 kg)   SpO2 96%   BMI 31.73 kg/m  , BMI Body mass index is 31.73 kg/m.  Wt Readings from Last 3 Encounters:  02/16/21 196 lb 9.6 oz (89.2 kg)  01/23/21 196 lb 12.8 oz (89.3 kg)  01/04/21 200 lb (90.7 kg)     GEN: Well nourished, well developed, in no acute distress. HEENT: normal. Neck: Supple, no JVD, carotid bruits, or masses. Cardiac: RRR, no murmurs, rubs, or gallops. No  clubbing, cyanosis, edema.  Radials/PT 2+ and equal bilaterally.  Respiratory:  Respirations regular and unlabored, clear to auscultation bilaterally. GI: Soft, nontender, nondistended. MS: No deformity or atrophy. Skin: Warm and dry, no rash. Neuro:  Strength and sensation are intact. Psych: Normal affect.  Assessment & Plan    CAD -reports decreasing chest pain since increased dose of Imdur.  Hesitant to further uptitrate due to low normal blood pressure and some hypotension at home.  He reports he exercises on the bicycle 15 minutes/day without exertional chest pain, pressure, tightness.  Notes some arm pain  with walking to the mailbox but occurs only with motion of his arms and short distance so low suspicion for angina.  We have referred him for cardiac rehab as I do think deconditioning is contributory to some of his mild dyspnea on exertion.  Bilateral arm pain.  Low suspicion for angina as it only occurs with motion.  Previous carpal tunnel surgery with some improvement.  Recommend discussion with primary care provider for further work-up.  HTN - 2 episodes recently of hypotension. His primary care provider has since discontinued HCTZ. If recurs, consider reducing Benazepril.   HLD - Continue Atorvasatin '80mg'$  daily. 11/2020 LDL 26.   Disposition: Follow up in 1 month(s) with Dr. Radford Pax or APP.  Signed, Loel Dubonnet, NP 02/16/2021, 10:56 AM [place]

## 2021-02-16 NOTE — Patient Instructions (Addendum)
Medication Instructions:  Continue your current medications.   If your blood pressure is consistently low or your have another episode of low blood pressure. We will consider reducing your dose of Benazepril.  *If you need a refill on your cardiac medications before your next appointment, please call your pharmacy*  Lab Work: None ordered today  Testing/Procedures: None ordered today.   Follow-Up: At [facility], you and your health needs are our priority.  As part of our continuing mission to provide you with exceptional heart care, we have created designated Provider Care Teams.  These Care Teams include your primary Cardiologist (physician) and Advanced Practice Providers (APPs -  Physician Assistants and Nurse Practitioners) who all work together to provide you with the care you need, when you need it.  We recommend signing up for the patient portal called "MyChart".  Sign up information is provided on this After Visit Summary.  MyChart is used to connect with patients for Virtual Visits (Telemedicine).  Patients are able to view lab/test results, encounter notes, upcoming appointments, etc.  Non-urgent messages can be sent to your provider as well.   To learn more about what you can do with MyChart, go to [email or web address].    Your next appointment:   As scheduled with Dr. Radford Pax in September   Other Instructions  You have been referred to cardiac rehab. This is a combination program including monitored exercise, dietary education, and support group. We strongly recommend participating in the program. Expect a phone call from them in approximately 2 weeks. If you do not hear from them, the phone number for cardiac rehab at [facility] [place] is [phone].   Heart Healthy Diet Recommendations: A low-salt diet is recommended. Meats should be grilled, baked, or boiled. Avoid fried foods. Focus on lean protein sources like fish or chicken with vegetables and fruits. The American Heart  Association is a Microbiologist!    Exercise recommendations: The American Heart Association recommends 150 minutes of moderate intensity exercise weekly. Try 30 minutes of moderate intensity exercise 4-5 times per week. This could include walking, jogging, or swimming.

## 2021-02-17 ENCOUNTER — Telehealth: Payer: Self-pay | Admitting: Family

## 2021-02-17 NOTE — Telephone Encounter (Signed)
Returned call to patient who states that this morning what looked like the entire Ranexa tablet was in his stool. Patient states this has happened before and states he is unsure if it happens everyday. Patient states that each time he has seen it, it looks like the entire tablet comes right back out. Patient denies any other symptoms. Advised patient I would forward message to PharmD for review and advice. Patient verbalized understanding.

## 2021-02-17 NOTE — Telephone Encounter (Signed)
This can happen with extended release medications (basically the medication is slowly released from tiny holes in the outer shell of the tablet) and sometimes just the shell can remain.  However I have not heard of this with Ranexa.  Is this happening every time he takes it?

## 2021-02-17 NOTE — Telephone Encounter (Signed)
Will call manufacturer on Monday since this is not a common side effect I have heard or researched with Ranexa

## 2021-02-17 NOTE — Telephone Encounter (Signed)
Pt c/o medication issue:  1. Name of Medication: ranolazine (RANEXA) 1000 MG SR tablet  2. How are you currently taking this medication (dosage and times per day)? As directed  3. Are you having a reaction (difficulty breathing--STAT)? no  4. What is your medication issue? Pt states the medicine is not dissolving in his system , the pill is coming out whole when pt uses the bathroom. Pt wants to know is this normal

## 2021-02-17 NOTE — Telephone Encounter (Signed)
Will route to PharmD for advice.

## 2021-02-21 ENCOUNTER — Other Ambulatory Visit: Payer: Self-pay

## 2021-02-21 NOTE — Telephone Encounter (Signed)
[place] pharmacy.  They use tablets made by [employer].  The pharmacist reported she had not heard of this effect happening before but she said she also does not dispense this particular product very often.  She looked in the package insert and could not find any mention of this either.   I called [employer] at [phone] and spoke with their medical representative.  She also had not received any complaints about this but she said that because it is an extended release tablet, it is possible due to differences in patient's digestions, that this could occur.  She opened up a case for [place]: [phone] in case we need to reference this later.  In the meantime, I would recommend patient try to fill his next prescription at a different pharmacy that stocks ranolazine made by a different manufacturer to see if he still experiences this.

## 2021-02-21 NOTE — Telephone Encounter (Signed)
Attempted to call patient, unable to leave message at this time.   Will forward message to Spero Geralds. Patient's Primary Cardiologist is Dr. Radford Pax.

## 2021-02-22 DIAGNOSIS — Z23 Encounter for immunization: Secondary | ICD-10-CM | POA: Diagnosis not present

## 2021-02-23 NOTE — Telephone Encounter (Signed)
Spoke with the patient and advised him on recommendations from PharmD. Patient states that he has plenty of tablets right now and since it is an expensive medication he wants to finish what he has before refilling at a different location. He will continue to monitor. He has a follow up with Dr. Radford Pax 9/21

## 2021-02-28 ENCOUNTER — Other Ambulatory Visit: Payer: Self-pay | Admitting: *Deleted

## 2021-02-28 DIAGNOSIS — Z0000 Encounter for general adult medical examination without abnormal findings: Secondary | ICD-10-CM

## 2021-02-28 MED ORDER — ALPRAZOLAM 0.25 MG PO TABS: 0.2500 mg | ORAL_TABLET | Freq: Every evening | ORAL | 0 refills | Status: DC | PRN

## 2021-03-03 DIAGNOSIS — Z0000 Encounter for general adult medical examination without abnormal findings: Secondary | ICD-10-CM | POA: Diagnosis not present

## 2021-03-04 LAB — GLUCOSE, RANDOM: Glucose: 126 mg/dL — ABNORMAL HIGH (ref 65–99)

## 2021-03-07 ENCOUNTER — Encounter: Payer: Medicare Other | Attending: Cardiology | Admitting: *Deleted

## 2021-03-07 ENCOUNTER — Other Ambulatory Visit: Payer: Self-pay

## 2021-03-07 DIAGNOSIS — I208 Other forms of angina pectoris: Secondary | ICD-10-CM | POA: Insufficient documentation

## 2021-03-07 NOTE — Telephone Encounter (Signed)
[place] from [employer] called back for follow up on case.  Pharmaceutical company will reach out to patient for further follow up.

## 2021-03-07 NOTE — Progress Notes (Signed)
Virtual orientation call completed today. he has an appointment on Date: 03/09/2021  for EP eval and gym Orientation.  Documentation of diagnosis can be found in [facility] Date: 08/25/02022 .

## 2021-03-08 ENCOUNTER — Other Ambulatory Visit: Payer: Self-pay | Admitting: Internal Medicine

## 2021-03-09 ENCOUNTER — Other Ambulatory Visit: Payer: Self-pay

## 2021-03-09 VITALS — Ht 67.2 in | Wt 198.7 lb

## 2021-03-09 DIAGNOSIS — I208 Other forms of angina pectoris: Secondary | ICD-10-CM

## 2021-03-09 NOTE — Patient Instructions (Signed)
Patient Instructions  Patient Details  Name: Daniel Holden MRN: [id number] Date of Birth: [birth year] Referring Provider:  Sueanne Margarita, MD  Below are your personal goals for exercise, nutrition, and risk factors. Our goal is to help you stay on track towards obtaining and maintaining these goals. We will be discussing your progress on these goals with you throughout the program.  Initial Exercise Prescription:  Initial Exercise Prescription - 03/09/21 0900       Date of Initial Exercise RX and Referring Provider   Date 03/09/21    Referring Provider Fransico Him MD      NuStep   Level 2    SPM 80    Minutes 15    METs 2.08      REL-XR   Level 1    Speed 50    Minutes 15    METs 2      Track   Laps 25   as tolerated   Minutes 15    METs 2.36      Prescription Details   Frequency (times per week) 3    Duration Progress to 30 minutes of continuous aerobic without signs/symptoms of physical distress      Intensity   THRR 40-80% of Max Heartrate 91-129    Ratings of Perceived Exertion 11-13    Perceived Dyspnea 0-4      Progression   Progression Continue progressive overload as per policy without signs/symptoms or physical distress.      Resistance Training   Training Prescription Yes    Weight 4 lb    Reps 10-15             Exercise Goals: Frequency: Be able to perform aerobic exercise two to three times per week in program working toward 2-5 days per week of home exercise.  Intensity: Work with a perceived exertion of 11 (fairly light) - 15 (hard) while following your exercise prescription.  We will make changes to your prescription with you as you progress through the program.   Duration: Be able to do 30 to 45 minutes of continuous aerobic exercise in addition to a 5 minute warm-up and a 5 minute cool-down routine.   Nutrition Goals: Your personal nutrition goals will be established when you do your nutrition analysis with the dietician.  The  following are general nutrition guidelines to follow: Cholesterol < '200mg'$ /day Sodium < '1500mg'$ /day Fiber: Men over 50 yrs - 30 grams per day  Personal Goals:  Personal Goals and Risk Factors at Admission - 03/09/21 1127       Core Components/Risk Factors/Patient Goals on Admission    Weight Management Yes;Weight Loss    Intervention Weight Management: Develop a combined nutrition and exercise program designed to reach desired caloric intake, while maintaining appropriate intake of nutrient and fiber, sodium and fats, and appropriate energy expenditure required for the weight goal.;Weight Management: Provide education and appropriate resources to help participant work on and attain dietary goals.;Weight Management/Obesity: Establish reasonable short term and long term weight goals.    Admit Weight 198 lb (89.8 kg)    Goal Weight: Short Term 193 lb (87.5 kg)    Goal Weight: Long Term 180 lb (81.6 kg)    Expected Outcomes Short Term: Continue to assess and modify interventions until short term weight is achieved;Long Term: Adherence to nutrition and physical activity/exercise program aimed toward attainment of established weight goal;Weight Loss: Understanding of general recommendations for a balanced deficit meal plan, which promotes 1-2 lb weight  loss per week and includes a negative energy balance of [phone] kcal/d;Understanding recommendations for meals to include 15-35% energy as protein, 25-35% energy from fat, 35-60% energy from carbohydrates, less than '200mg'$  of dietary cholesterol, 20-35 gm of total fiber daily;Understanding of distribution of calorie intake throughout the day with the consumption of 4-5 meals/snacks    Hypertension Yes    Intervention Provide education on lifestyle modifcations including regular physical activity/exercise, weight management, moderate sodium restriction and increased consumption of fresh fruit, vegetables, and low fat dairy, alcohol moderation, and smoking  cessation.;Monitor prescription use compliance.    Expected Outcomes Short Term: Continued assessment and intervention until BP is < 140/15m HG in hypertensive participants. < 130/842mHG in hypertensive participants with diabetes, heart failure or chronic kidney disease.;Long Term: Maintenance of blood pressure at goal levels.    Lipids Yes    Intervention Provide education and support for participant on nutrition & aerobic/resistive exercise along with prescribed medications to achieve LDL '70mg'$ , HDL >'40mg'$ .    Expected Outcomes Short Term: Participant states understanding of desired cholesterol values and is compliant with medications prescribed. Participant is following exercise prescription and nutrition guidelines.;Long Term: Cholesterol controlled with medications as prescribed, with individualized exercise RX and with personalized nutrition plan. Value goals: LDL < '70mg'$ , HDL > 40 mg.             Tobacco Use Initial Evaluation: Social History   Tobacco Use  Smoking Status Never  Smokeless Tobacco Never    Exercise Goals and Review:  Exercise Goals     Row Name 03/09/21 0933             Exercise Goals   Increase Physical Activity Yes       Intervention Provide advice, education, support and counseling about physical activity/exercise needs.;Develop an individualized exercise prescription for aerobic and resistive training based on initial evaluation findings, risk stratification, comorbidities and participant's personal goals.       Expected Outcomes Short Term: Attend rehab on a regular basis to increase amount of physical activity.;Long Term: Add in home exercise to make exercise part of routine and to increase amount of physical activity.;Long Term: Exercising regularly at least 3-5 days a week.       Increase Strength and Stamina Yes       Intervention Provide advice, education, support and counseling about physical activity/exercise needs.;Develop an individualized  exercise prescription for aerobic and resistive training based on initial evaluation findings, risk stratification, comorbidities and participant's personal goals.       Expected Outcomes Short Term: Increase workloads from initial exercise prescription for resistance, speed, and METs.;Short Term: Perform resistance training exercises routinely during rehab and add in resistance training at home;Long Term: Improve cardiorespiratory fitness, muscular endurance and strength as measured by increased METs and functional capacity (6MWT)       Able to understand and use rate of perceived exertion (RPE) scale Yes       Intervention Provide education and explanation on how to use RPE scale       Expected Outcomes Short Term: Able to use RPE daily in rehab to express subjective intensity level;Long Term:  Able to use RPE to guide intensity level when exercising independently       Able to understand and use Dyspnea scale Yes       Intervention Provide education and explanation on how to use Dyspnea scale       Expected Outcomes Short Term: Able to use Dyspnea scale daily in rehab  to express subjective sense of shortness of breath during exertion;Long Term: Able to use Dyspnea scale to guide intensity level when exercising independently       Knowledge and understanding of Target Heart Rate Range (THRR) Yes       Intervention Provide education and explanation of THRR including how the numbers were predicted and where they are located for reference       Expected Outcomes Short Term: Able to state/look up THRR;Long Term: Able to use THRR to govern intensity when exercising independently;Short Term: Able to use daily as guideline for intensity in rehab       Able to check pulse independently Yes       Intervention Provide education and demonstration on how to check pulse in carotid and radial arteries.;Review the importance of being able to check your own pulse for safety during independent exercise       Expected  Outcomes Short Term: Able to explain why pulse checking is important during independent exercise;Long Term: Able to check pulse independently and accurately       Understanding of Exercise Prescription Yes       Intervention Provide education, explanation, and written materials on patient's individual exercise prescription       Expected Outcomes Short Term: Able to explain program exercise prescription;Long Term: Able to explain home exercise prescription to exercise independently                Copy of goals given to participant.

## 2021-03-09 NOTE — Progress Notes (Signed)
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[place])  Patient's Home Medications on Admission:  Current Outpatient Medications:    ALPRAZolam (XANAX) 0.25 MG tablet, Take 1 tablet (0.25 mg total) by mouth at bedtime as needed for sleep., Disp: 30 tablet, Rfl: 0   aspirin 81 MG EC tablet, Take 81 mg by mouth daily., Disp: , Rfl:    atorvastatin (LIPITOR) 80 MG tablet, Take 1 tablet (80 mg total) by mouth daily., Disp: 90 tablet, Rfl: 3   benazepril (LOTENSIN) 20 MG tablet, Take 1 tablet (20 mg total) by mouth daily., Disp: 90 tablet, Rfl: 3   benazepril (LOTENSIN) 40 MG tablet, TAKE 1 TABLET DAILY, Disp: 90 tablet, Rfl: 1   Cholecalciferol 125 MCG (5000 UT) TABS, Take 5,000 Units by mouth daily., Disp: , Rfl:    cyanocobalamin 1000 MCG tablet, Take 1,000 mcg by mouth daily., Disp: , Rfl:    fluticasone (FLONASE) 50 MCG/ACT nasal spray, Place 2 sprays into both nostrils daily. , Disp: , Rfl:    gabapentin (NEURONTIN) 300 MG capsule, Take 300 mg by mouth 2 (two) times daily. (Patient not taking: Reported on 02/16/2021), Disp: , Rfl:    isosorbide mononitrate (IMDUR) 60 MG 24 hr tablet, Take 1.5 tablets (90 mg total) by mouth daily., Disp: 135 tablet, Rfl: 3   melatonin 3 MG TABS tablet, Take 10 mg by mouth at bedtime., Disp: , Rfl:    metoprolol tartrate (LOPRESSOR) 50 MG tablet, Take 1 tablet (50 mg total) by mouth 2 (two) times daily., Disp: 180 tablet, Rfl: 3   naproxen sodium (ALEVE) 220 MG tablet, Take 220 mg by mouth 2 (two) times daily., Disp: , Rfl:    omeprazole (PRILOSEC OTC) 20 MG tablet, Take 10 mg by  mouth daily., Disp: , Rfl:    ondansetron (ZOFRAN-ODT) 8 MG disintegrating tablet, Take 8 mg by mouth every 8 (eight) hours as needed., Disp: , Rfl:    ranolazine (RANEXA) 1000 MG SR tablet, Take 1 tablet (1,000 mg total) by mouth 2 (two) times daily., Disp: 180 tablet, Rfl: 3   tiZANidine (ZANAFLEX) 4 MG tablet, Take 4 mg by mouth at bedtime., Disp: , Rfl:   Past Medical History: Past Medical History:  Diagnosis Date   Allergy    Angina of effort ([place])    Anxiety    CAD (coronary artery disease), native coronary artery    coronary CTA showing aortic atherosclerosis with very high coronary Ca score at 4630 with severely calcified RCA and LAD > 70% and 50-69% prox to mid LCx.     Carpal tunnel syndrome    Hypertension    Lung nodule    Obesity    Pancreatitis 2015   Sleep apnea     Tobacco Use: Social History   Tobacco Use  Smoking Status Never  Smokeless Tobacco Never    Labs: Recent Review Flowsheet Data     Labs for ITP Cardiac and Pulmonary Rehab Latest Ref Rng & Units 09/27/2013 11/07/2020   Cholestrol 100 - 199 mg/dL 83 104   LDLCALC 0 - 99 mg/dL 17 26  HDL >39 mg/dL 28(L) 58   Trlycerides 0 - 149 mg/dL 188 111        Exercise Target Goals: Exercise Program Goal: Individual exercise prescription set using results from initial 6 min walk test and THRR while considering  patient's activity barriers and safety.   Exercise Prescription Goal: Initial exercise prescription builds to 30-45 minutes a day of aerobic activity, 2-3 days per week.  Home exercise guidelines will be given to patient during program as part of exercise prescription that the participant will acknowledge.   Education: Aerobic Exercise: - Group verbal and visual presentation on the components of exercise prescription. Introduces F.I.T.T principle from ACSM for exercise prescriptions.  Reviews F.I.T.T. principles of aerobic exercise including progression. Written material given at  graduation. Flowsheet Row Cardiac Rehab from 03/09/2021 in [facility] Cardiac and Pulmonary Rehab  Education need identified 03/09/21       Education: Resistance Exercise: - Group verbal and visual presentation on the components of exercise prescription. Introduces F.I.T.T principle from ACSM for exercise prescriptions  Reviews F.I.T.T. principles of resistance exercise including progression. Written material given at graduation.    Education: Exercise & Equipment Safety: - Individual verbal instruction and demonstration of equipment use and safety with use of the equipment. Flowsheet Row Cardiac Rehab from 03/09/2021 in [facility] Cardiac and Pulmonary Rehab  Education need identified 03/09/21  Date 03/09/21  Educator [place]  Instruction Review Code 1- Verbalizes Understanding       Education: Exercise Physiology & General Exercise Guidelines: - Group verbal and written instruction with models to review the exercise physiology of the cardiovascular system and associated critical values. Provides general exercise guidelines with specific guidelines to those with heart or lung disease.    Education: Flexibility, Balance, Mind/Body Relaxation: - Group verbal and visual presentation with interactive activity on the components of exercise prescription. Introduces F.I.T.T principle from ACSM for exercise prescriptions. Reviews F.I.T.T. principles of flexibility and balance exercise training including progression. Also discusses the mind body connection.  Reviews various relaxation techniques to help reduce and manage stress (i.e. Deep breathing, progressive muscle relaxation, and visualization). Balance handout provided to take home. Written material given at graduation.   Activity Barriers & Risk Stratification:  Activity Barriers & Cardiac Risk Stratification - 03/07/21 0847       Activity Barriers & Cardiac Risk Stratification   Activity Barriers Left Knee Replacement;Chest Pain/Angina   knee  replacement 13 weeks ago   Cardiac Risk Stratification Moderate             6 Minute Walk:  6 Minute Walk     Row Name 03/09/21 0934         6 Minute Walk   Phase Initial     Distance 1200 feet     Walk Time 6 minutes     # of Rest Breaks 0     MPH 2.27     METS 2.08     RPE 8     Perceived Dyspnea  0     VO2 Peak 7.29     Symptoms No     Resting HR 54 bpm     Resting BP 98/62     Resting Oxygen Saturation  98 %     Exercise Oxygen Saturation  during 6 min walk 97 %     Max Ex. HR 73 bpm     Max Ex. BP 118/60     2 Minute Post BP 106/60  Oxygen Initial Assessment:   Oxygen Re-Evaluation:   Oxygen Discharge (Final Oxygen Re-Evaluation):   Initial Exercise Prescription:  Initial Exercise Prescription - 03/09/21 0900       Date of Initial Exercise RX and Referring Provider   Date 03/09/21    Referring Provider Fransico Him MD      NuStep   Level 2    SPM 80    Minutes 15    METs 2.08      REL-XR   Level 1    Speed 50    Minutes 15    METs 2      Track   Laps 25   as tolerated   Minutes 15    METs 2.36      Prescription Details   Frequency (times per week) 3    Duration Progress to 30 minutes of continuous aerobic without signs/symptoms of physical distress      Intensity   THRR 40-80% of Max Heartrate 91-129    Ratings of Perceived Exertion 11-13    Perceived Dyspnea 0-4      Progression   Progression Continue progressive overload as per policy without signs/symptoms or physical distress.      Resistance Training   Training Prescription Yes    Weight 4 lb    Reps 10-15             Perform Capillary Blood Glucose checks as needed.  Exercise Prescription Changes:   Exercise Prescription Changes     Row Name 03/09/21 0900             Response to Exercise   Blood Pressure (Admit) 98/62       Blood Pressure (Exercise) 118/60       Blood Pressure (Exit) 106/60       Heart Rate (Admit) 54 bpm        Heart Rate (Exercise) 73 bpm       Heart Rate (Exit) 55 bpm       Oxygen Saturation (Admit) 98 %       Oxygen Saturation (Exercise) 97 %       Oxygen Saturation (Exit) 97 %       Rating of Perceived Exertion (Exercise) 8       Perceived Dyspnea (Exercise) 0       Symptoms none       Comments walk test results                Exercise Comments:   Exercise Goals and Review:   Exercise Goals     Row Name 03/09/21 0933             Exercise Goals   Increase Physical Activity Yes       Intervention Provide advice, education, support and counseling about physical activity/exercise needs.;Develop an individualized exercise prescription for aerobic and resistive training based on initial evaluation findings, risk stratification, comorbidities and participant's personal goals.       Expected Outcomes Short Term: Attend rehab on a regular basis to increase amount of physical activity.;Long Term: Add in home exercise to make exercise part of routine and to increase amount of physical activity.;Long Term: Exercising regularly at least 3-5 days a week.       Increase Strength and Stamina Yes       Intervention Provide advice, education, support and counseling about physical activity/exercise needs.;Develop an individualized exercise prescription for aerobic and resistive training based on initial evaluation findings, risk stratification, comorbidities and participant's personal  goals.       Expected Outcomes Short Term: Increase workloads from initial exercise prescription for resistance, speed, and METs.;Short Term: Perform resistance training exercises routinely during rehab and add in resistance training at home;Long Term: Improve cardiorespiratory fitness, muscular endurance and strength as measured by increased METs and functional capacity (6MWT)       Able to understand and use rate of perceived exertion (RPE) scale Yes       Intervention Provide education and explanation on how to use  RPE scale       Expected Outcomes Short Term: Able to use RPE daily in rehab to express subjective intensity level;Long Term:  Able to use RPE to guide intensity level when exercising independently       Able to understand and use Dyspnea scale Yes       Intervention Provide education and explanation on how to use Dyspnea scale       Expected Outcomes Short Term: Able to use Dyspnea scale daily in rehab to express subjective sense of shortness of breath during exertion;Long Term: Able to use Dyspnea scale to guide intensity level when exercising independently       Knowledge and understanding of Target Heart Rate Range (THRR) Yes       Intervention Provide education and explanation of THRR including how the numbers were predicted and where they are located for reference       Expected Outcomes Short Term: Able to state/look up THRR;Long Term: Able to use THRR to govern intensity when exercising independently;Short Term: Able to use daily as guideline for intensity in rehab       Able to check pulse independently Yes       Intervention Provide education and demonstration on how to check pulse in carotid and radial arteries.;Review the importance of being able to check your own pulse for safety during independent exercise       Expected Outcomes Short Term: Able to explain why pulse checking is important during independent exercise;Long Term: Able to check pulse independently and accurately       Understanding of Exercise Prescription Yes       Intervention Provide education, explanation, and written materials on patient's individual exercise prescription       Expected Outcomes Short Term: Able to explain program exercise prescription;Long Term: Able to explain home exercise prescription to exercise independently                Exercise Goals Re-Evaluation :   Discharge Exercise Prescription (Final Exercise Prescription Changes):  Exercise Prescription Changes - 03/09/21 0900        Response to Exercise   Blood Pressure (Admit) 98/62    Blood Pressure (Exercise) 118/60    Blood Pressure (Exit) 106/60    Heart Rate (Admit) 54 bpm    Heart Rate (Exercise) 73 bpm    Heart Rate (Exit) 55 bpm    Oxygen Saturation (Admit) 98 %    Oxygen Saturation (Exercise) 97 %    Oxygen Saturation (Exit) 97 %    Rating of Perceived Exertion (Exercise) 8    Perceived Dyspnea (Exercise) 0    Symptoms none    Comments walk test results             Nutrition:  Target Goals: Understanding of nutrition guidelines, daily intake of sodium '1500mg'$ , cholesterol '200mg'$ , calories 30% from fat and 7% or less from saturated fats, daily to have 5 or more servings of fruits and vegetables.  Education:  All About Nutrition: -Group instruction provided by verbal, written material, interactive activities, discussions, models, and posters to present general guidelines for heart healthy nutrition including fat, fiber, MyPlate, the role of sodium in heart healthy nutrition, utilization of the nutrition label, and utilization of this knowledge for meal planning. Follow up email sent as well. Written material given at graduation. Flowsheet Row Cardiac Rehab from 03/09/2021 in [facility] Cardiac and Pulmonary Rehab  Education need identified 03/09/21       Biometrics:  Pre Biometrics - 03/09/21 0924       Pre Biometrics   Height 5' 7.2" (1.707 m)    Weight 198 lb 11.2 oz (90.1 kg)    BMI (Calculated) 30.93    Single Leg Stand 8.1 seconds              Nutrition Therapy Plan and Nutrition Goals:  Nutrition Therapy & Goals - 03/09/21 0950       Intervention Plan   Intervention Prescribe, educate and counsel regarding individualized specific dietary modifications aiming towards targeted core components such as weight, hypertension, lipid management, diabetes, heart failure and other comorbidities.    Expected Outcomes Short Term Goal: Understand basic principles of dietary content, such as  calories, fat, sodium, cholesterol and nutrients.;Short Term Goal: A plan has been developed with personal nutrition goals set during dietitian appointment.;Long Term Goal: Adherence to prescribed nutrition plan.             Nutrition Assessments:  MEDIFICTS Score Key: ?70 Need to make dietary changes  40-70 Heart Healthy Diet ? 40 Therapeutic Level Cholesterol Diet  Flowsheet Row Cardiac Rehab from 03/09/2021 in [facility] Cardiac and Pulmonary Rehab  Picture Your Plate Total Score on Admission 57      Picture Your Plate Scores: [id number] Unhealthy dietary pattern with much room for improvement. 41-50 Dietary pattern unlikely to meet recommendations for good health and room for improvement. 51-60 More healthful dietary pattern, with some room for improvement.  >60 Healthy dietary pattern, although there may be some specific behaviors that could be improved.    Nutrition Goals Re-Evaluation:   Nutrition Goals Discharge (Final Nutrition Goals Re-Evaluation):   Psychosocial: Target Goals: Acknowledge presence or absence of significant depression and/or stress, maximize coping skills, provide positive support system. Participant is able to verbalize types and ability to use techniques and skills needed for reducing stress and depression.   Education: Stress, Anxiety, and Depression - Group verbal and visual presentation to define topics covered.  Reviews how body is impacted by stress, anxiety, and depression.  Also discusses healthy ways to reduce stress and to treat/manage anxiety and depression.  Written material given at graduation.   Education: Sleep Hygiene -Provides group verbal and written instruction about how sleep can affect your health.  Define sleep hygiene, discuss sleep cycles and impact of sleep habits. Review good sleep hygiene tips.    Initial Review & Psychosocial Screening:  Initial Psych Review & Screening - 03/07/21 0848       Initial Review   Current issues  with None Identified      Family Dynamics   Good Support System? Yes   wife   churchfamily     Barriers   Psychosocial barriers to participate in program There are no identifiable barriers or psychosocial needs.      Screening Interventions   Interventions Encouraged to exercise;Provide feedback about the scores to participant;To provide support and resources with identified psychosocial needs    Expected Outcomes Short Term goal: Utilizing  psychosocial counselor, staff and physician to assist with identification of specific Stressors or current issues interfering with healing process. Setting desired goal for each stressor or current issue identified.;Long Term Goal: Stressors or current issues are controlled or eliminated.;Short Term goal: Identification and review with participant of any Quality of Life or Depression concerns found by scoring the questionnaire.;Long Term goal: The participant improves quality of Life and PHQ9 Scores as seen by post scores and/or verbalization of changes             Quality of Life Scores:   Quality of Life - 03/09/21 0934       Quality of Life   Select Quality of Life      Quality of Life Scores   Health/Function Pre 21.33 %    Socioeconomic Pre 27.86 %    Psych/Spiritual Pre 21.64 %    Family Pre 28.8 %    GLOBAL Pre 23.84 %            Scores of 19 and below usually indicate a poorer quality of life in these areas.  A difference of  2-3 points is a clinically meaningful difference.  A difference of 2-3 points in the total score of the Quality of Life Index has been associated with significant improvement in overall quality of life, self-image, physical symptoms, and general health in studies assessing change in quality of life.  PHQ-9: Recent Review Flowsheet Data     Depression screen [facility] 2/9 03/09/2021 01/23/2021 07/14/2020   Decreased Interest 0 0 0   Down, Depressed, Hopeless 0 0 0   PHQ - 2 Score 0 0 0   Altered sleeping 0 - -    Tired, decreased energy 1 - -   Change in appetite 1 - -   Feeling bad or failure about yourself  0 - -   Trouble concentrating 0 - -   Moving slowly or fidgety/restless 0 - -   Suicidal thoughts 0 - -   PHQ-9 Score 2 - -   Difficult doing work/chores Not difficult at all - -      Interpretation of Total Score  Total Score Depression Severity:  1-4 = Minimal depression, 5-9 = Mild depression, 10-14 = Moderate depression, 15-19 = Moderately severe depression, 20-27 = Severe depression   Psychosocial Evaluation and Intervention:  Psychosocial Evaluation - 03/07/21 0909       Psychosocial Evaluation & Interventions   Interventions Encouraged to exercise with the program and follow exercise prescription    Comments Liliane Channel has no barriers to attending the program. 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 He stated the symptoms occur when he is swinging his arms.  He lives with his wife, his support person. He also has his church family. He is ready to get started to see if he can gain better control of his symptoms.    Expected Outcomes STG: Liliane Channel attends all scheduled sessions, he is able to work on symptom control with the advice of the exercise and nursing staff. LZTG Liliane Channel has acheived better control of his symptoms and continues to maintain control.    Continue Psychosocial Services  Follow up required by staff             Psychosocial Re-Evaluation:   Psychosocial Discharge (Final Psychosocial Re-Evaluation):   Vocational Rehabilitation: Provide vocational rehab assistance to qualifying candidates.   Vocational Rehab Evaluation & Intervention:  Vocational  Rehab - 03/07/21 0857       Initial Vocational Rehab Evaluation & Intervention   Assessment shows need for Vocational Rehabilitation No      Vocational Rehab Re-Evaulation   Comments retired             Education: Education Goals:  Education classes will be provided on a variety of topics geared toward better understanding of heart health and risk factor modification. Participant will state understanding/return demonstration of topics presented as noted by education test scores.  Learning Barriers/Preferences:  Learning Barriers/Preferences - 03/07/21 0856       Learning Barriers/Preferences   Learning Barriers None   wears hearing aids   Learning Preferences None             General Cardiac Education Topics:  AED/CPR: - Group verbal and written instruction with the use of models to demonstrate the basic use of the AED with the basic ABC's of resuscitation.   Anatomy and Cardiac Procedures: - Group verbal and visual presentation and models provide information about basic cardiac anatomy and function. Reviews the testing methods done to diagnose heart disease and the outcomes of the test results. Describes the treatment choices: Medical Management, Angioplasty, or Coronary Bypass Surgery for treating various heart conditions including Myocardial Infarction, Angina, Valve Disease, and Cardiac Arrhythmias.  Written material given at graduation.   Medication Safety: - Group verbal and visual instruction to review commonly prescribed medications for heart and lung disease. Reviews the medication, class of the drug, and side effects. Includes the steps to properly store meds and maintain the prescription regimen.  Written material given at graduation.   Intimacy: - Group verbal instruction through game format to discuss how heart and lung disease can affect sexual intimacy. Written material given at graduation..   Know Your Numbers and Heart Failure: - Group verbal and visual instruction to discuss disease risk factors for cardiac and pulmonary disease and treatment options.  Reviews associated critical values for Overweight/Obesity, Hypertension, Cholesterol, and Diabetes.  Discusses basics of heart failure:  signs/symptoms and treatments.  Introduces Heart Failure Zone chart for action plan for heart failure.  Written material given at graduation.   Infection Prevention: - Provides verbal and written material to individual with discussion of infection control including proper hand washing and proper equipment cleaning during exercise session. Flowsheet Row Cardiac Rehab from 03/09/2021 in [facility] Cardiac and Pulmonary Rehab  Education need identified 03/09/21  Date 03/09/21  Educator [place]  Instruction Review Code 1- Verbalizes Understanding       Falls Prevention: - Provides verbal and written material to individual with discussion of falls prevention and safety. Flowsheet Row Cardiac Rehab from 03/09/2021 in [facility] Cardiac and Pulmonary Rehab  Education need identified 03/09/21  Date 03/09/21  Educator [place]  Instruction Review Code 1- Verbalizes Understanding       Other: -Provides group and verbal instruction on various topics (see comments)   Knowledge Questionnaire Score:  Knowledge Questionnaire Score - 03/09/21 [id number]       Knowledge Questionnaire Score   Pre Score 23/26: MI, Nutrition, Exercise             Core Components/Risk Factors/Patient Goals at Admission:  Personal Goals and Risk Factors at Admission - 03/09/21 1127       Core Components/Risk Factors/Patient Goals on Admission    Weight Management Yes;Weight Loss    Intervention Weight Management: Develop a combined nutrition and exercise program designed to reach desired caloric intake, while maintaining appropriate intake  of nutrient and fiber, sodium and fats, and appropriate energy expenditure required for the weight goal.;Weight Management: Provide education and appropriate resources to help participant work on and attain dietary goals.;Weight Management/Obesity: Establish reasonable short term and long term weight goals.    Admit Weight 198 lb (89.8 kg)    Goal Weight: Short Term 193 lb (87.5 kg)    Goal Weight:  Long Term 180 lb (81.6 kg)    Expected Outcomes Short Term: Continue to assess and modify interventions until short term weight is achieved;Long Term: Adherence to nutrition and physical activity/exercise program aimed toward attainment of established weight goal;Weight Loss: Understanding of general recommendations for a balanced deficit meal plan, which promotes 1-2 lb weight loss per week and includes a negative energy balance of [phone] kcal/d;Understanding recommendations for meals to include 15-35% energy as protein, 25-35% energy from fat, 35-60% energy from carbohydrates, less than '200mg'$  of dietary cholesterol, 20-35 gm of total fiber daily;Understanding of distribution of calorie intake throughout the day with the consumption of 4-5 meals/snacks    Hypertension Yes    Intervention Provide education on lifestyle modifcations including regular physical activity/exercise, weight management, moderate sodium restriction and increased consumption of fresh fruit, vegetables, and low fat dairy, alcohol moderation, and smoking cessation.;Monitor prescription use compliance.    Expected Outcomes Short Term: Continued assessment and intervention until BP is < 140/69m HG in hypertensive participants. < 130/832mHG in hypertensive participants with diabetes, heart failure or chronic kidney disease.;Long Term: Maintenance of blood pressure at goal levels.    Lipids Yes    Intervention Provide education and support for participant on nutrition & aerobic/resistive exercise along with prescribed medications to achieve LDL '70mg'$ , HDL >'40mg'$ .    Expected Outcomes Short Term: Participant states understanding of desired cholesterol values and is compliant with medications prescribed. Participant is following exercise prescription and nutrition guidelines.;Long Term: Cholesterol controlled with medications as prescribed, with individualized exercise RX and with personalized nutrition plan. Value goals: LDL < '70mg'$ , HDL >  40 mg.             Education:Diabetes - Individual verbal and written instruction to review signs/symptoms of diabetes, desired ranges of glucose level fasting, after meals and with exercise. Acknowledge that pre and post exercise glucose checks will be done for 3 sessions at entry of program.   Core Components/Risk Factors/Patient Goals Review:    Core Components/Risk Factors/Patient Goals at Discharge (Final Review):    ITP Comments:  ITP Comments     Row Name 03/07/21 0904 03/09/21 0923         ITP Comments Virtual orientation call completed today. he has an appointment on Date: 03/09/2021  for EP eval and gym Orientation.  [facility] . Completed 6MWT and gym orientation. Initial ITP created and sent for review to Dr. MaEmily FilbertMedical Director.               Comments: Initial ITP

## 2021-03-10 ENCOUNTER — Other Ambulatory Visit: Payer: Self-pay

## 2021-03-10 ENCOUNTER — Encounter: Payer: Medicare Other | Admitting: *Deleted

## 2021-03-10 DIAGNOSIS — I208 Other forms of angina pectoris: Secondary | ICD-10-CM

## 2021-03-10 DIAGNOSIS — Z20822 Contact with and (suspected) exposure to covid-19: Secondary | ICD-10-CM | POA: Diagnosis not present

## 2021-03-10 NOTE — Progress Notes (Signed)
Daily Session Note  Patient Details  Name: Daniel Holden MRN: [id number] Date of Birth: 1945/10/26 Referring Provider:   Flowsheet Row Cardiac Rehab from 03/09/2021 in [facility] Cardiac and Pulmonary Rehab  Referring Provider Fransico Him MD       Encounter Date: 03/10/2021  Check In:  Session Check In - 03/10/21 0752       Check-In   Supervising physician immediately available to respond to emergencies See telemetry face sheet for immediately available ER MD    Location [facility]-Cardiac & Pulmonary Rehab    Staff Present Renita Papa, RN BSN;Joseph [place], RCP,RRT,BSRT;Jessica Bratenahl, [place], RCEP, CCRP, CCET    Virtual Visit No    Medication changes reported     Yes    Comments decreased imdur    Fall or balance concerns reported    No    Warm-up and Cool-down Performed on first and last piece of equipment    Resistance Training Performed Yes    VAD Patient? No    PAD/SET Patient? No      Pain Assessment   Currently in Pain? No/denies                Social History   Tobacco Use  Smoking Status Never  Smokeless Tobacco Never    Goals Met:  Independence with exercise equipment Exercise tolerated well No report of concerns or symptoms today Strength training completed today  Goals Unmet:  Not Applicable  Comments: First full day of exercise!  Patient was oriented to gym and equipment including functions, settings, policies, and procedures.  Patient's individual exercise prescription and treatment plan were reviewed.  All starting workloads were established based on the results of the 6 minute walk test done at initial orientation visit.  The plan for exercise progression was also introduced and progression will be customized based on patient's performance and goals.     Dr. Emily Filbert is Medical Director for [place].  Dr. Ottie Glazier is Medical Director for [facility] Pulmonary Rehabilitation.

## 2021-03-13 ENCOUNTER — Other Ambulatory Visit: Payer: Self-pay

## 2021-03-13 DIAGNOSIS — I208 Other forms of angina pectoris: Secondary | ICD-10-CM

## 2021-03-13 DIAGNOSIS — J301 Allergic rhinitis due to pollen: Secondary | ICD-10-CM | POA: Diagnosis not present

## 2021-03-13 DIAGNOSIS — H903 Sensorineural hearing loss, bilateral: Secondary | ICD-10-CM | POA: Diagnosis not present

## 2021-03-13 NOTE — Progress Notes (Signed)
Daily Session Note  Patient Details  Name: Daniel Holden MRN: [id number] Date of Birth: 14-Sep-1945 Referring Provider:   Flowsheet Row Cardiac Rehab from 03/09/2021 in [facility] Cardiac and Pulmonary Rehab  Referring Provider Fransico Him MD       Encounter Date: 03/13/2021  Check In:  Session Check In - 03/13/21 0749       Check-In   Supervising physician immediately available to respond to emergencies See telemetry face sheet for immediately available ER MD    Location [facility]-Cardiac & Pulmonary Rehab    Staff Present Birdie Sons, MPA, Mauricia Area, BS, ACSM CEP, Exercise Physiologist;Joseph Tessie Fass, [place]    Virtual Visit No    Medication changes reported     No    Fall or balance concerns reported    No    Warm-up and Cool-down Performed on first and last piece of equipment    Resistance Training Performed Yes    VAD Patient? No    PAD/SET Patient? No      Pain Assessment   Currently in Pain? No/denies                Social History   Tobacco Use  Smoking Status Never  Smokeless Tobacco Never    Goals Met:  Independence with exercise equipment Exercise tolerated well No report of concerns or symptoms today Strength training completed today  Goals Unmet:  Not Applicable  Comments: Pt able to follow exercise prescription today without complaint.  Will continue to monitor for progression.    Dr. Emily Filbert is Medical Director for [place].  Dr. Ottie Glazier is Medical Director for [facility] Pulmonary Rehabilitation.

## 2021-03-14 ENCOUNTER — Other Ambulatory Visit: Payer: Self-pay

## 2021-03-14 DIAGNOSIS — I208 Other forms of angina pectoris: Secondary | ICD-10-CM

## 2021-03-14 NOTE — Progress Notes (Signed)
Daily Session Note  Patient Details  Name: Daniel Holden MRN: [id number] Date of Birth: August 24, 1945 Referring Provider:   Flowsheet Row Cardiac Rehab from 03/09/2021 in [facility] Cardiac and Pulmonary Rehab  Referring Provider Fransico Him MD       Encounter Date: 03/14/2021  Check In:  Session Check In - 03/14/21 0741       Check-In   Supervising physician immediately available to respond to emergencies See telemetry face sheet for immediately available ER MD    Location [facility]-Cardiac & Pulmonary Rehab    Staff Present Birdie Sons, MPA, RN;Jessica Luan Pulling, MA, RCEP, CCRP, CCET;Amanda Sommer, BA, ACSM CEP, Exercise Physiologist    Virtual Visit No    Medication changes reported     No    Fall or balance concerns reported    No    Warm-up and Cool-down Performed on first and last piece of equipment    Resistance Training Performed Yes    VAD Patient? No    PAD/SET Patient? No      Pain Assessment   Currently in Pain? No/denies                Social History   Tobacco Use  Smoking Status Never  Smokeless Tobacco Never    Goals Met:  Independence with exercise equipment Exercise tolerated well No report of concerns or symptoms today Strength training completed today  Goals Unmet:  Not Applicable  Comments: Pt able to follow exercise prescription today without complaint.  Will continue to monitor for progression.    Dr. Emily Filbert is Medical Director for [place].  Dr. Ottie Glazier is Medical Director for [facility] Pulmonary Rehabilitation.

## 2021-03-15 ENCOUNTER — Other Ambulatory Visit: Payer: Self-pay

## 2021-03-15 ENCOUNTER — Ambulatory Visit (INDEPENDENT_AMBULATORY_CARE_PROVIDER_SITE_OTHER): Payer: Medicare Other | Admitting: Cardiology

## 2021-03-15 ENCOUNTER — Encounter: Payer: Self-pay | Admitting: Cardiology

## 2021-03-15 VITALS — BP 118/62 | HR 60 | Ht 67.0 in | Wt 199.0 lb

## 2021-03-15 DIAGNOSIS — I208 Other forms of angina pectoris: Secondary | ICD-10-CM | POA: Diagnosis not present

## 2021-03-15 DIAGNOSIS — I10 Essential (primary) hypertension: Secondary | ICD-10-CM

## 2021-03-15 DIAGNOSIS — I25118 Atherosclerotic heart disease of native coronary artery with other forms of angina pectoris: Secondary | ICD-10-CM | POA: Diagnosis not present

## 2021-03-15 DIAGNOSIS — E7800 Pure hypercholesterolemia, unspecified: Secondary | ICD-10-CM | POA: Diagnosis not present

## 2021-03-15 MED ORDER — AMLODIPINE BESYLATE 5 MG PO TABS: 5.0000 mg | ORAL_TABLET | Freq: Every day | ORAL | 11 refills | Status: DC

## 2021-03-15 MED ORDER — ATORVASTATIN CALCIUM 80 MG PO TABS: 80.0000 mg | ORAL_TABLET | Freq: Every day | ORAL | 3 refills | Status: DC

## 2021-03-15 MED ORDER — BENAZEPRIL HCL 20 MG PO TABS: 20.0000 mg | ORAL_TABLET | Freq: Every day | ORAL | 3 refills | Status: DC

## 2021-03-15 MED ORDER — RANOLAZINE ER 1000 MG PO TB12: 1000.0000 mg | ORAL_TABLET | Freq: Two times a day (BID) | ORAL | 3 refills | Status: DC

## 2021-03-15 MED ORDER — METOPROLOL TARTRATE 50 MG PO TABS: 50.0000 mg | ORAL_TABLET | Freq: Two times a day (BID) | ORAL | 3 refills | Status: DC

## 2021-03-15 NOTE — Progress Notes (Addendum)
Cardiology Office Note    Date:  03/15/2021   ID:  Daniel Holden, DOB 25-Nov-1945, MRN [id number]  PCP:  Daniel Athens, MD  Cardiologist:  Daniel Him, MD   Chief Complaint  Patient presents with   Coronary Artery Disease   Hypertension   Hyperlipidemia    History of Present Illness:  Daniel Holden is a 75 y.o. male with a hx of anxiety, HTN , HLD and CAD with coronary CTA showing aortic atherosclerosis with very high coronary Ca score at 4630 with severely calcified RCA and LAD > 70% and 50-69% prox to mid LCx.  Cardiac cath 10/04/2020 showed severe single vessel CAD with occluded RCA with extensive calcifications and multiple different lesions including 90% proxm and distal lesions, 50% oLCx and 50-60% dLCx and 40% pLAD with heavily calcified vessels with bridging collaterals from the septal perforator and diagonal to the PDA. Medical management was recommended.  He was started on Ranexa and Imdur.  He is here today for followup and is doing well.  He had been having some problems with bilateral arm pain when he saw our PA and felt it was likely noncardiac.  He is now in cardiac rehab and working out on the machinery without any problems with angina.  He had some mild discomfort when he was walking outside in a large crowd and thinks it was anxiety and stress.  He did not have to take any NTG.  Otherwise he has not had any anginal CP recently.  He has had problems with headaches on Imdur and decreased back to 60mg  daily but is still getting headaches.  He denies any SOB, DOE, PND, orthopnea, LE edema, dizziness, palpitations or syncope. He is compliant with his meds and is tolerating meds with no SE.     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     Carpal tunnel syndrome    HLD (hyperlipidemia)    Hypertension    Lung nodule    Obesity    Pancreatitis 2015   Sleep apnea     Past Surgical History:  Procedure Laterality Date   APPENDECTOMY     Patient states he did not have an appendectomy and does not know why this is in his chart. Daniel Holden 06/28/2020   CARPAL TUNNEL RELEASE     KNEE ARTHROSCOPY WITH MEDIAL MENISECTOMY Left 06/25/2019   Procedure: KNEE ARTHROSCOPY WITH MEDIAL MENISECTOMY;  Surgeon: Thornton Park, MD;  Location: [facility] ORS;  Service: Orthopedics;  Laterality: Left;   LEFT HEART CATH AND CORONARY ANGIOGRAPHY N/A 10/04/2020   Procedure: LEFT HEART CATH AND CORONARY ANGIOGRAPHY;  Surgeon: Leonie Man, MD;  Location: [facility];  Service: Cardiovascular;  Laterality: N/A;   SHOULDER SURGERY     TONSILLECTOMY      Current Medications: Current Meds  Medication Sig   ALPRAZolam (XANAX) 0.25 MG tablet Take 1 tablet (0.25 mg total) by mouth at bedtime as needed for sleep.   aspirin 81 MG EC tablet Take 81 mg by mouth daily.   atorvastatin (LIPITOR) 80 MG tablet Take 1 tablet (80 mg total) by mouth daily.   benazepril (LOTENSIN) 20 MG tablet Take 1 tablet (  20 mg total) by mouth daily.   Cholecalciferol 125 MCG (5000 UT) TABS Take 5,000 Units by mouth daily.   cyanocobalamin 1000 MCG tablet Take 1,000 mcg by mouth daily.   fluticasone (FLONASE) 50 MCG/ACT nasal spray Place 2 sprays into both nostrils daily.    gabapentin (NEURONTIN) 300 MG capsule Take 300 mg by mouth 2 (two) times daily.   isosorbide mononitrate (IMDUR) 60 MG 24 hr tablet Take 1.5 tablets (90 mg total) by mouth daily.   melatonin 3 MG TABS tablet Take 10 mg by mouth at bedtime.   metoprolol tartrate (LOPRESSOR) 50 MG tablet Take 1 tablet (50 mg total) by mouth 2 (two) times daily.   naproxen sodium (ALEVE) 220 MG tablet Take 220 mg by mouth 2 (two) times daily.   omeprazole (PRILOSEC OTC) 20 MG tablet Take 10 mg by  mouth daily.   ranolazine (RANEXA) 1000 MG SR tablet Take 1 tablet (1,000 mg total) by mouth 2 (two) times daily.    Allergies:   Dilaudid [hydromorphone hcl]   Social History   Socioeconomic History   Marital status: Married    Spouse name: Not on file   Number of children: Not on file   Years of education: Not on file   Highest education level: Not on file  Occupational History   Not on file  Tobacco Use   Smoking status: Never   Smokeless tobacco: Never  Vaping Use   Vaping Use: Never used  Substance and Sexual Activity   Alcohol use: Yes    Alcohol/week: 2.0 standard drinks    Types: 2 Cans of beer per week    Comment: 6 per day   Drug use: No   Sexual activity: Not on file  Other Topics Concern   Not on file  Social History Narrative   Not on file   Social Determinants of Health   Financial Resource Strain: Not on file  Food Insecurity: Not on file  Transportation Needs: Not on file  Physical Activity: Not on file  Stress: Not on file  Social Connections: Not on file     Family History:  The patient's family history includes CVA in his father.   ROS:   Please see the history of present illness.    Review of Systems  Musculoskeletal:  Negative for muscle weakness.  All other systems reviewed and are negative.  No flowsheet data found.     PHYSICAL EXAM:   VS:  BP 118/62   Pulse 60   Ht 5\' 7"  (1.702 m)   Wt 199 lb (90.3 kg)   SpO2 96%   BMI 31.17 kg/m    GEN: Well nourished, well developed in no acute distress HEENT: Normal NECK: No JVD; No carotid bruits LYMPHATICS: No lymphadenopathy CARDIAC:RRR, no murmurs, rubs, gallops RESPIRATORY:  Clear to auscultation without rales, wheezing or rhonchi  ABDOMEN: Soft, non-tender, non-distended MUSCULOSKELETAL:  No edema; No deformity  SKIN: Warm and dry NEUROLOGIC:  Alert and oriented x 3 PSYCHIATRIC:  Normal affect    Wt Readings from Last 3 Encounters:  03/15/21 199 lb (90.3 kg)  03/09/21 198  lb 11.2 oz (90.1 kg)  02/16/21 196 lb 9.6 oz (89.2 kg)      Studies/Labs Reviewed:   EKG:  EKG is not ordered today.    Recent Labs: 11/07/2020: ALT 17 01/12/2021: BUN 13; Creatinine, Ser 0.88; Potassium 4.5; Sodium 131 01/18/2021: Hemoglobin 10.5; Platelets 281   Lipid Panel    Component Value Date/Time  CHOL 104 11/07/2020 0741   CHOL 83 09/27/2013 0319   TRIG 111 11/07/2020 0741   TRIG 188 09/27/2013 0319   HDL 58 11/07/2020 0741   HDL 28 (L) 09/27/2013 0319   CHOLHDL 1.8 11/07/2020 0741   VLDL 38 09/27/2013 0319   LDLCALC 26 11/07/2020 0741   LDLCALC 17 09/27/2013 0319    Additional studies/ records that were reviewed today include:  Cardiac cath 10/04/2020 Diagnostic Dominance: Right       ASSESSMENT:    1. Coronary artery disease of native artery of native heart with stable angina pectoris ([place])   2. Primary hypertension   3. Pure hypercholesterolemia      PLAN:  In order of problems listed above:  1.  ASCAD with chronic stable angina -coronary CTA showing aortic atherosclerosis with very high coronary Ca score at 4630 with severely calcified RCA and LAD > 70% and 50-69% prox to mid LCx.   -Cardiac cath 10/04/2020 showed severe single vessel CAD with occluded RCA with extensive calcifications and multiple different lesions including 90% proxm and distal lesions, 50% oLCx and 50-60% dLCx and 40% pLAD with heavily calcified vessels with bridging collaterals from the septal perforator and diagonal to the PDA.  -Medical management was recommended  -his angina is very well controlled on Ranexa and long acting nitrates -Continue prescription drug management with ASA 81mg  daily, Lopressor 50mg  BID, Ranexa 1000mg  BID and statin>refilled  -stop Imdur due to problems with significant headaches and add Amlodipine 5mg  daily -he will call me in 1-2 weeks to let me know if his HAs have resolved  2.  HTN -BP is adequately controlled on exam today -Continue prescription  drug management with Lopressor 50mg  BID, HCTZ 12.5mg  daily and Lostensin 40mg  daily>refilled  -I have personally reviewed and interpreted outside labs performed by patient's PCP which showed SCR 0.88 in July 2022   3.  HLD -LDL goal < 70 -Continue prescription drug management with Atrovastatin 80mg  daily>refilled -I have personally reviewed and interpreted outside labs performed by patient's PCP which showed LDL 26, HDL 58, TGs 111 in May 2022    Medication Adjustments/Labs and Tests Ordered: Current medicines are reviewed at length with the patient today.  Concerns regarding medicines are outlined above.  Medication changes, Labs and Tests ordered today are listed in the Patient Instructions below.  There are no Patient Instructions on file for this visit.   Signed, Daniel Him, MD  03/15/2021 9:41 AM    [facility] [place], [place], [place]  [address] Phone: [phone]; Fax: [phone]

## 2021-03-15 NOTE — Patient Instructions (Signed)
CALL IN 1-2 WEEKS TO LET DR. Radford Pax KNOW IF YOUR HEADACHES  HAVE RESOLVED.   Medication Instructions:  Your physician has recommended you make the following change in your medication: 1) STOP taking Imdur 2) START taking amlodipine 5 mg daily   *If you need a refill on your cardiac medications before your next appointment, please call your pharmacy*  Follow-Up: At [facility], you and your health needs are our priority.  As part of our continuing mission to provide you with exceptional heart care, we have created designated Provider Care Teams.  These Care Teams include your primary Cardiologist (physician) and Advanced Practice Providers (APPs -  Physician Assistants and Nurse Practitioners) who all work together to provide you with the care you need, when you need it.   Your next appointment:   4-6 week(s)  The format for your next appointment:   In Person  Provider:   Laurann Montana, NP   Follow up with Dr. Radford Pax in 6 months

## 2021-03-15 NOTE — Addendum Note (Signed)
Addended by: Antonieta Iba on: 03/15/2021 09:50 AM   Modules accepted: Orders

## 2021-03-19 ENCOUNTER — Emergency Department: Payer: Medicare Other

## 2021-03-19 ENCOUNTER — Observation Stay: Payer: Medicare Other

## 2021-03-19 ENCOUNTER — Other Ambulatory Visit: Payer: Self-pay

## 2021-03-19 ENCOUNTER — Inpatient Hospital Stay
Admission: EM | Admit: 2021-03-19 | Discharge: 2021-03-23 | DRG: 135 | Disposition: A | Discharge status: Home / Self Care | Payer: Medicare Other | Attending: Internal Medicine | Admitting: Internal Medicine

## 2021-03-19 DIAGNOSIS — G43909 Migraine, unspecified, not intractable, without status migrainosus: Secondary | ICD-10-CM | POA: Diagnosis present

## 2021-03-19 DIAGNOSIS — R200 Anesthesia of skin: Secondary | ICD-10-CM | POA: Diagnosis not present

## 2021-03-19 DIAGNOSIS — K5900 Constipation, unspecified: Secondary | ICD-10-CM | POA: Diagnosis present

## 2021-03-19 DIAGNOSIS — J320 Chronic maxillary sinusitis: Secondary | ICD-10-CM | POA: Diagnosis not present

## 2021-03-19 DIAGNOSIS — Z20822 Contact with and (suspected) exposure to covid-19: Secondary | ICD-10-CM | POA: Diagnosis not present

## 2021-03-19 DIAGNOSIS — K760 Fatty (change of) liver, not elsewhere classified: Secondary | ICD-10-CM | POA: Diagnosis present

## 2021-03-19 DIAGNOSIS — L0390 Cellulitis, unspecified: Secondary | ICD-10-CM | POA: Diagnosis not present

## 2021-03-19 DIAGNOSIS — I10 Essential (primary) hypertension: Secondary | ICD-10-CM | POA: Diagnosis present

## 2021-03-19 DIAGNOSIS — J0100 Acute maxillary sinusitis, unspecified: Principal | ICD-10-CM

## 2021-03-19 DIAGNOSIS — R220 Localized swelling, mass and lump, head: Secondary | ICD-10-CM | POA: Diagnosis not present

## 2021-03-19 DIAGNOSIS — Z6832 Body mass index (BMI) 32.0-32.9, adult: Secondary | ICD-10-CM

## 2021-03-19 DIAGNOSIS — H05012 Cellulitis of left orbit: Secondary | ICD-10-CM | POA: Diagnosis present

## 2021-03-19 DIAGNOSIS — H538 Other visual disturbances: Secondary | ICD-10-CM | POA: Diagnosis not present

## 2021-03-19 DIAGNOSIS — J329 Chronic sinusitis, unspecified: Secondary | ICD-10-CM | POA: Diagnosis not present

## 2021-03-19 DIAGNOSIS — L03213 Periorbital cellulitis: Secondary | ICD-10-CM | POA: Diagnosis not present

## 2021-03-19 DIAGNOSIS — G4700 Insomnia, unspecified: Secondary | ICD-10-CM | POA: Diagnosis present

## 2021-03-19 DIAGNOSIS — J328 Other chronic sinusitis: Secondary | ICD-10-CM | POA: Diagnosis not present

## 2021-03-19 DIAGNOSIS — Z87891 Personal history of nicotine dependence: Secondary | ICD-10-CM

## 2021-03-19 DIAGNOSIS — J324 Chronic pansinusitis: Secondary | ICD-10-CM | POA: Diagnosis present

## 2021-03-19 DIAGNOSIS — H0520 Unspecified exophthalmos: Secondary | ICD-10-CM | POA: Diagnosis not present

## 2021-03-19 DIAGNOSIS — J439 Emphysema, unspecified: Secondary | ICD-10-CM | POA: Diagnosis present

## 2021-03-19 DIAGNOSIS — J3089 Other allergic rhinitis: Secondary | ICD-10-CM | POA: Diagnosis present

## 2021-03-19 DIAGNOSIS — E6609 Other obesity due to excess calories: Secondary | ICD-10-CM | POA: Diagnosis present

## 2021-03-19 DIAGNOSIS — I2510 Atherosclerotic heart disease of native coronary artery without angina pectoris: Secondary | ICD-10-CM | POA: Diagnosis present

## 2021-03-19 DIAGNOSIS — Z885 Allergy status to narcotic agent status: Secondary | ICD-10-CM

## 2021-03-19 DIAGNOSIS — E66811 Obesity, class 1: Secondary | ICD-10-CM

## 2021-03-19 DIAGNOSIS — E870 Hyperosmolality and hypernatremia: Secondary | ICD-10-CM | POA: Diagnosis not present

## 2021-03-19 DIAGNOSIS — R519 Headache, unspecified: Secondary | ICD-10-CM | POA: Diagnosis not present

## 2021-03-19 DIAGNOSIS — G43809 Other migraine, not intractable, without status migrainosus: Secondary | ICD-10-CM | POA: Diagnosis not present

## 2021-03-19 DIAGNOSIS — G4733 Obstructive sleep apnea (adult) (pediatric): Secondary | ICD-10-CM

## 2021-03-19 DIAGNOSIS — J322 Chronic ethmoidal sinusitis: Secondary | ICD-10-CM | POA: Diagnosis not present

## 2021-03-19 DIAGNOSIS — Z7982 Long term (current) use of aspirin: Secondary | ICD-10-CM

## 2021-03-19 DIAGNOSIS — Z79899 Other long term (current) drug therapy: Secondary | ICD-10-CM

## 2021-03-19 DIAGNOSIS — K219 Gastro-esophageal reflux disease without esophagitis: Secondary | ICD-10-CM | POA: Diagnosis present

## 2021-03-19 DIAGNOSIS — E871 Hypo-osmolality and hyponatremia: Secondary | ICD-10-CM

## 2021-03-19 DIAGNOSIS — E1165 Type 2 diabetes mellitus with hyperglycemia: Secondary | ICD-10-CM | POA: Diagnosis present

## 2021-03-19 DIAGNOSIS — H05011 Cellulitis of right orbit: Secondary | ICD-10-CM | POA: Diagnosis not present

## 2021-03-19 DIAGNOSIS — J3489 Other specified disorders of nose and nasal sinuses: Secondary | ICD-10-CM | POA: Diagnosis not present

## 2021-03-19 DIAGNOSIS — F419 Anxiety disorder, unspecified: Secondary | ICD-10-CM | POA: Diagnosis present

## 2021-03-19 DIAGNOSIS — G629 Polyneuropathy, unspecified: Secondary | ICD-10-CM

## 2021-03-19 DIAGNOSIS — E1140 Type 2 diabetes mellitus with diabetic neuropathy, unspecified: Secondary | ICD-10-CM | POA: Diagnosis present

## 2021-03-19 DIAGNOSIS — L0291 Cutaneous abscess, unspecified: Secondary | ICD-10-CM | POA: Diagnosis present

## 2021-03-19 DIAGNOSIS — G43819 Other migraine, intractable, without status migrainosus: Secondary | ICD-10-CM

## 2021-03-19 DIAGNOSIS — E785 Hyperlipidemia, unspecified: Secondary | ICD-10-CM | POA: Diagnosis present

## 2021-03-19 LAB — COMPREHENSIVE METABOLIC PANEL
ALT: 7 U/L (ref 0–44)
AST: 14 U/L — ABNORMAL LOW (ref 15–41)
Albumin: 4.2 g/dL (ref 3.5–5.0)
Alkaline Phosphatase: 66 U/L (ref 38–126)
Anion gap: 11 (ref 5–15)
BUN: 21 mg/dL (ref 8–23)
CO2: 24 mmol/L (ref 22–32)
Calcium: 9.6 mg/dL (ref 8.9–10.3)
Chloride: 88 mmol/L — ABNORMAL LOW (ref 98–111)
Creatinine, Ser: 0.88 mg/dL (ref 0.61–1.24)
GFR, Estimated: >60 mL/min (ref >60)
Glucose, Bld: 179 mg/dL — ABNORMAL HIGH (ref 70–99)
Potassium: 4.3 mmol/L (ref 3.5–5.1)
Sodium: 123 mmol/L — ABNORMAL LOW (ref 135–145)
Total Bilirubin: 1.4 mg/dL — ABNORMAL HIGH (ref 0.3–1.2)
Total Protein: 7.9 g/dL (ref 6.5–8.1)

## 2021-03-19 LAB — CBC
HCT: 34.1 % — ABNORMAL LOW (ref 39.0–52.0)
Hemoglobin: 12.4 g/dL — ABNORMAL LOW (ref 13.0–17.0)
MCH: 35.4 pg — ABNORMAL HIGH (ref 26.0–34.0)
MCHC: 36.4 g/dL — ABNORMAL HIGH (ref 30.0–36.0)
MCV: 97.4 fL (ref 80.0–100.0)
Platelets: 292 10*3/uL (ref 150–400)
RBC: 3.5 MIL/uL — ABNORMAL LOW (ref 4.22–5.81)
RDW: 12.3 % (ref 11.5–15.5)
WBC: 12.4 10*3/uL — ABNORMAL HIGH (ref 4.0–10.5)
nRBC: 0 % (ref 0.0–0.2)

## 2021-03-19 LAB — DIFFERENTIAL
Abs Immature Granulocytes: 0.07 10*3/uL (ref 0.00–0.07)
Basophils Absolute: 0.1 10*3/uL (ref 0.0–0.1)
Basophils Relative: 0 %
Eosinophils Absolute: 0.1 10*3/uL (ref 0.0–0.5)
Eosinophils Relative: 1 %
Immature Granulocytes: 1 %
Lymphocytes Relative: 15 %
Lymphs Abs: 1.9 10*3/uL (ref 0.7–4.0)
Monocytes Absolute: 1.3 10*3/uL — ABNORMAL HIGH (ref 0.1–1.0)
Monocytes Relative: 10 %
Neutro Abs: 9 10*3/uL — ABNORMAL HIGH (ref 1.7–7.7)
Neutrophils Relative %: 73 %

## 2021-03-19 LAB — RESP PANEL BY RT-PCR (FLU A&B, COVID) ARPGX2
Influenza A by PCR: NEGATIVE
Influenza B by PCR: NEGATIVE
SARS Coronavirus 2 by RT PCR: NEGATIVE

## 2021-03-19 LAB — CBG MONITORING, ED
Glucose-Capillary: 200 mg/dL — ABNORMAL HIGH (ref 70–99)
Glucose-Capillary: 161 mg/dL — ABNORMAL HIGH (ref 70–99)

## 2021-03-19 LAB — MRSA NEXT GEN BY PCR, NASAL: MRSA by PCR Next Gen: NOT DETECTED

## 2021-03-19 LAB — SEDIMENTATION RATE: Sed Rate: 67 mm/hr — ABNORMAL HIGH (ref 0–20)

## 2021-03-19 LAB — PROCALCITONIN: Procalcitonin: <0.1 ng/mL

## 2021-03-19 MED ORDER — ACETAMINOPHEN 500 MG PO TABS
1000.0000 mg | ORAL_TABLET | Freq: Once | ORAL | Status: AC
  Administered 2021-03-19: 1000 mg via ORAL
  Filled 2021-03-19: qty 2

## 2021-03-19 MED ORDER — ENOXAPARIN SODIUM 40 MG/0.4ML IJ SOSY
40.0000 mg | PREFILLED_SYRINGE | INTRAMUSCULAR | Status: DC
  Administered 2021-03-19: 40 mg via SUBCUTANEOUS
  Filled 2021-03-19: qty 0.4

## 2021-03-19 MED ORDER — ACETAMINOPHEN 325 MG PO TABS
650.0000 mg | ORAL_TABLET | Freq: Four times a day (QID) | ORAL | Status: AC | PRN
  Administered 2021-03-19 – 2021-03-21 (×2): 650 mg via ORAL
  Filled 2021-03-19 (×2): qty 2

## 2021-03-19 MED ORDER — VITAMIN D3 25 MCG (1000 UNIT) PO TABS
5000.0000 [IU] | ORAL_TABLET | Freq: Every day | ORAL | Status: DC
  Administered 2021-03-20 – 2021-03-23 (×4): 5000 [IU] via ORAL
  Filled 2021-03-19 (×6): qty 5

## 2021-03-19 MED ORDER — INSULIN ASPART 100 UNIT/ML IJ SOLN
0.0000 [IU] | Freq: Three times a day (TID) | INTRAMUSCULAR | Status: DC
  Administered 2021-03-20: 3 [IU] via SUBCUTANEOUS
  Administered 2021-03-20 – 2021-03-22 (×6): 2 [IU] via SUBCUTANEOUS
  Administered 2021-03-22: 1 [IU] via SUBCUTANEOUS
  Administered 2021-03-23: 2 [IU] via SUBCUTANEOUS
  Administered 2021-03-23: 1 [IU] via SUBCUTANEOUS
  Filled 2021-03-19 (×10): qty 1

## 2021-03-19 MED ORDER — ONDANSETRON HCL 4 MG PO TABS: 4.0000 mg | ORAL_TABLET | Freq: Four times a day (QID) | ORAL | Status: DC | PRN

## 2021-03-19 MED ORDER — AMLODIPINE BESYLATE 5 MG PO TABS
5.0000 mg | ORAL_TABLET | Freq: Every day | ORAL | Status: DC
  Administered 2021-03-20 – 2021-03-23 (×4): 5 mg via ORAL
  Filled 2021-03-19 (×4): qty 1

## 2021-03-19 MED ORDER — ACETAMINOPHEN 650 MG RE SUPP: 650.0000 mg | Freq: Four times a day (QID) | RECTAL | Status: AC | PRN

## 2021-03-19 MED ORDER — ONDANSETRON HCL 4 MG/2ML IJ SOLN: 4.0000 mg | Freq: Four times a day (QID) | INTRAMUSCULAR | Status: DC | PRN

## 2021-03-19 MED ORDER — OMEPRAZOLE MAGNESIUM 20 MG PO TBEC: 20.0000 mg | DELAYED_RELEASE_TABLET | ORAL | Status: DC

## 2021-03-19 MED ORDER — BENAZEPRIL HCL 20 MG PO TABS
20.0000 mg | ORAL_TABLET | Freq: Every day | ORAL | Status: DC
  Administered 2021-03-20 – 2021-03-23 (×4): 20 mg via ORAL
  Filled 2021-03-19 (×4): qty 1

## 2021-03-19 MED ORDER — METOPROLOL TARTRATE 50 MG PO TABS
50.0000 mg | ORAL_TABLET | Freq: Two times a day (BID) | ORAL | Status: DC
  Administered 2021-03-20 – 2021-03-23 (×8): 50 mg via ORAL
  Filled 2021-03-19 (×7): qty 1

## 2021-03-19 MED ORDER — MELATONIN 5 MG PO TABS
10.0000 mg | ORAL_TABLET | Freq: Every day | ORAL | Status: DC
  Administered 2021-03-20 – 2021-03-22 (×3): 10 mg via ORAL
  Filled 2021-03-19 (×3): qty 2

## 2021-03-19 MED ORDER — SODIUM CHLORIDE 0.9% FLUSH
3.0000 mL | Freq: Once | INTRAVENOUS | Status: AC
  Administered 2021-03-19: 3 mL via INTRAVENOUS

## 2021-03-19 MED ORDER — RANOLAZINE ER 500 MG PO TB12
1000.0000 mg | ORAL_TABLET | Freq: Two times a day (BID) | ORAL | Status: DC
  Administered 2021-03-20 – 2021-03-23 (×7): 1000 mg via ORAL
  Filled 2021-03-19 (×9): qty 2

## 2021-03-19 MED ORDER — FLUTICASONE PROPIONATE 50 MCG/ACT NA SUSP
2.0000 | Freq: Every day | NASAL | Status: DC
  Filled 2021-03-19: qty 16

## 2021-03-19 MED ORDER — SODIUM CHLORIDE 0.9 % IV SOLN
3.0000 g | Freq: Four times a day (QID) | INTRAVENOUS | Status: DC
  Administered 2021-03-19: 3 g via INTRAVENOUS
  Filled 2021-03-19: qty 8

## 2021-03-19 MED ORDER — MIDAZOLAM HCL 2 MG/2ML IJ SOLN
1.0000 mg | Freq: Once | INTRAMUSCULAR | Status: AC
  Administered 2021-03-19: 1 mg via INTRAVENOUS
  Filled 2021-03-19: qty 2

## 2021-03-19 MED ORDER — METOCLOPRAMIDE HCL 5 MG/ML IJ SOLN
10.0000 mg | Freq: Once | INTRAMUSCULAR | Status: AC
  Administered 2021-03-19: 10 mg via INTRAVENOUS
  Filled 2021-03-19: qty 2

## 2021-03-19 MED ORDER — DIPHENHYDRAMINE HCL 50 MG/ML IJ SOLN
12.5000 mg | Freq: Once | INTRAMUSCULAR | Status: AC
  Administered 2021-03-19: 12.5 mg via INTRAVENOUS
  Filled 2021-03-19: qty 1

## 2021-03-19 MED ORDER — ALPRAZOLAM 0.25 MG PO TABS
0.2500 mg | ORAL_TABLET | Freq: Every evening | ORAL | Status: DC | PRN
  Administered 2021-03-19 – 2021-03-22 (×4): 0.25 mg via ORAL
  Filled 2021-03-19 (×4): qty 1

## 2021-03-19 MED ORDER — SODIUM CHLORIDE 0.9 % IV BOLUS
500.0000 mL | Freq: Once | INTRAVENOUS | Status: AC
  Administered 2021-03-19: 500 mL via INTRAVENOUS

## 2021-03-19 MED ORDER — SODIUM CHLORIDE 0.9 % IV SOLN
3.0000 g | Freq: Once | INTRAVENOUS | Status: AC
  Administered 2021-03-19: 3 g via INTRAVENOUS
  Filled 2021-03-19: qty 8

## 2021-03-19 MED ORDER — ATORVASTATIN CALCIUM 20 MG PO TABS
80.0000 mg | ORAL_TABLET | Freq: Every day | ORAL | Status: DC
  Filled 2021-03-19: qty 4

## 2021-03-19 MED ORDER — GADOBUTROL 1 MMOL/ML IV SOLN
7.5000 mL | Freq: Once | INTRAVENOUS | Status: AC | PRN
  Administered 2021-03-19: 7.5 mL via INTRAVENOUS

## 2021-03-19 MED ORDER — GABAPENTIN 300 MG PO CAPS
300.0000 mg | ORAL_CAPSULE | Freq: Two times a day (BID) | ORAL | Status: DC
  Administered 2021-03-19 – 2021-03-23 (×8): 300 mg via ORAL
  Filled 2021-03-19 (×8): qty 1

## 2021-03-19 MED ORDER — INSULIN ASPART 100 UNIT/ML IJ SOLN
0.0000 [IU] | Freq: Every day | INTRAMUSCULAR | Status: DC
  Administered 2021-03-20 – 2021-03-21 (×2): 2 [IU] via SUBCUTANEOUS
  Filled 2021-03-19 (×2): qty 1

## 2021-03-19 MED ORDER — VITAMIN B-12 1000 MCG PO TABS
1000.0000 ug | ORAL_TABLET | Freq: Every day | ORAL | Status: DC
  Administered 2021-03-20 – 2021-03-23 (×4): 1000 ug via ORAL
  Filled 2021-03-19 (×5): qty 1

## 2021-03-19 NOTE — Progress Notes (Signed)
Patient has home cpap. Unsure if he will be admitted or transferred. Patient compliant with cpap. Patient self manages without difficulty. Supplied water for use if he stays.

## 2021-03-19 NOTE — Consult Note (Signed)
Daniel Holden, Daniel Holden [id number] December 21, 1945 Daniel Nearing, MD  Reason for Consult: Sinusitis, orbital cellulitis Requesting Physician: Criss Alvine, DO Consulting Physician: Daniel Holden  HPI: This 75 y.o. year old male was admitted on 03/19/2021 for Abscess [L02.91].  This patient has a 2-week history of intermittent headaches particular around the right eye which is worsened this weekend, developing double vision, more intense facial pain, mid facial numbness, and pain in the upper teeth on the right side.  He has no prior history of chronic sinus disease though he has had allergies.  He has a history of an elevated hemoglobin A1c but has not been treated for diabetes previously.  He presented to the ER with an elevated white count of 12,000 and on imaging was noted to have an opacified right maxillary sinus with evidence of bony erosion in the floor of the right orbit and associated orbital cellulitis with possible early phlegmonous change.  Allergies:  Allergies  Allergen Reactions   Dilaudid [Hydromorphone Hcl] Nausea And Vomiting    Medications: (Not in a hospital admission) .  Current Facility-Administered Medications  Medication Dose Route Frequency Provider Last Rate Last Admin   acetaminophen (TYLENOL) tablet 650 mg  650 mg Oral Q6H PRN Cox, Amy N, DO       Or   acetaminophen (TYLENOL) suppository 650 mg  650 mg Rectal Q6H PRN Cox, Amy N, DO       ALPRAZolam (XANAX) tablet 0.25 mg  0.25 mg Oral QHS PRN Cox, Amy N, DO   0.25 mg at 03/19/21 2245   [START ON 03/20/2021] Ampicillin-Sulbactam (UNASYN) 3 g in sodium chloride 0.9 % 100 mL IVPB  3 g Intravenous Q6H Cox, Amy N, DO       [START ON 03/20/2021] atorvastatin (LIPITOR) tablet 80 mg  80 mg Oral Daily Cox, Amy N, DO       enoxaparin (LOVENOX) injection 40 mg  40 mg Subcutaneous Q24H Cox, Amy N, DO   40 mg at 03/19/21 2244   gabapentin (NEURONTIN) capsule 300 mg  300 mg Oral BID Cox, Amy N, DO   300 mg at 03/19/21 2245   insulin aspart  (novoLOG) injection 0-5 Units  0-5 Units Subcutaneous QHS Cox, Amy N, DO       [START ON 03/20/2021] insulin aspart (novoLOG) injection 0-9 Units  0-9 Units Subcutaneous TID WC Cox, Amy N, DO       ondansetron (ZOFRAN) tablet 4 mg  4 mg Oral Q6H PRN Cox, Amy N, DO       Or   ondansetron (ZOFRAN) injection 4 mg  4 mg Intravenous Q6H PRN Cox, Amy N, DO       Current Outpatient Medications  Medication Sig Dispense Refill   ALPRAZolam (XANAX) 0.25 MG tablet Take 1 tablet (0.25 mg total) by mouth at bedtime as needed for sleep. 30 tablet 0   amLODipine (NORVASC) 5 MG tablet Take 1 tablet (5 mg total) by mouth daily. 30 tablet 11   aspirin 81 MG EC tablet Take 81 mg by mouth daily.     atorvastatin (LIPITOR) 80 MG tablet Take 1 tablet (80 mg total) by mouth daily. 90 tablet 3   benazepril (LOTENSIN) 20 MG tablet Take 1 tablet (20 mg total) by mouth daily. 90 tablet 3   Cholecalciferol 125 MCG (5000 UT) TABS Take 5,000 Units by mouth daily.     cyanocobalamin 1000 MCG tablet Take 1,000 mcg by mouth daily.     fluticasone (FLONASE) 50  MCG/ACT nasal spray Place 2 sprays into both nostrils daily.      gabapentin (NEURONTIN) 300 MG capsule Take 300 mg by mouth 2 (two) times daily.     melatonin 3 MG TABS tablet Take 10 mg by mouth at bedtime.     metoprolol tartrate (LOPRESSOR) 50 MG tablet Take 1 tablet (50 mg total) by mouth 2 (two) times daily. 180 tablet 3   naproxen sodium (ALEVE) 220 MG tablet Take 220 mg by mouth 2 (two) times daily.     omeprazole (PRILOSEC OTC) 20 MG tablet Take 10 mg by mouth daily.     ondansetron (ZOFRAN-ODT) 8 MG disintegrating tablet Take 8 mg by mouth every 8 (eight) hours as needed. (Patient not taking: Reported on 03/15/2021)     ranolazine (RANEXA) 1000 MG SR tablet Take 1 tablet (1,000 mg total) by mouth 2 (two) times daily. 180 tablet 3   tiZANidine (ZANAFLEX) 4 MG tablet Take 4 mg by mouth at bedtime. (Patient not taking: Reported on 03/15/2021)      PMH:  Past  Medical History:  Diagnosis Date   Allergy    Angina of effort ([place])    Anxiety    CAD (coronary artery disease), native coronary artery    coronary CTA showing aortic atherosclerosis with very high coronary Ca score at 4630 with severely calcified RCA and LAD > 70% and 50-69% prox to mid LCx.     Carpal tunnel syndrome    HLD (hyperlipidemia)    Hypertension    Lung nodule    Obesity    Pancreatitis 2015   Sleep apnea     Fam Hx:  Family History  Problem Relation Age of Onset   CVA Father     Soc Hx:  Social History   Socioeconomic History   Marital status: Married    Spouse name: Not on file   Number of children: Not on file   Years of education: Not on file   Highest education level: Not on file  Occupational History   Not on file  Tobacco Use   Smoking status: Never   Smokeless tobacco: Never  Vaping Use   Vaping Use: Never used  Substance and Sexual Activity   Alcohol use: Yes    Alcohol/week: 2.0 standard drinks    Types: 2 Cans of beer per week    Comment: 6 per day   Drug use: No   Sexual activity: Not on file  Other Topics Concern   Not on file  Social History Narrative   Not on file   Social Determinants of Health   Financial Resource Strain: Not on file  Food Insecurity: Not on file  Transportation Needs: Not on file  Physical Activity: Not on file  Stress: Not on file  Social Connections: Not on file  Intimate Partner Violence: Not on file    PSH:  Past Surgical History:  Procedure Laterality Date   APPENDECTOMY     Patient states he did not have an appendectomy and does not know why this is in his chart. [place] 06/28/2020   CARPAL TUNNEL RELEASE     KNEE ARTHROSCOPY WITH MEDIAL MENISECTOMY Left 06/25/2019   Procedure: KNEE ARTHROSCOPY WITH MEDIAL MENISECTOMY;  Surgeon: Thornton Park, MD;  Location: [facility];  Service: Orthopedics;  Laterality: Left;   LEFT HEART CATH AND CORONARY ANGIOGRAPHY N/A 10/04/2020   Procedure: LEFT HEART CATH  AND CORONARY ANGIOGRAPHY;  Surgeon: Leonie Man, MD;  Location: [facility];  Service: Cardiovascular;  Laterality: N/A;   SHOULDER SURGERY     TONSILLECTOMY    . Procedures since admission: No admission procedures for hospital encounter.  ROS: Review of systems normal other than 12 systems except per HPI.  PHYSICAL EXAM Vitals:  Vitals:   03/19/21 1544 03/19/21 1825  BP: 137/73 130/80  Pulse: 72 75  Resp: 18 17  Temp: 98.4 F (36.9 C) 98.5 F (36.9 C)  SpO2: 96% 98%  . General: Well-developed, Well-nourished in no acute distress Mood: Mood and affect well adjusted, pleasant and cooperative. Orientation: Grossly alert and oriented. Vocal Quality: No hoarseness. Communicates verbally. head and Face: NCAT. No facial asymmetry. No visible skin lesions. No significant facial scars. No tenderness with sinus percussion. Facial strength normal and symmetric. Ears: External ears with normal landmarks, no lesions. External auditory canals free of infection, cerumen impaction or lesions. Tympanic membranes intact with good landmarks and normal mobility on pneumatic otoscopy. No middle ear effusion. Hearing: Speech reception grossly normal. Nose: External nose normal with midline dorsum and no lesions or deformity. Nasal Cavity reveals essentially midline septum with mucosal inflammation on the right with congestion, scant cloudy secretions. Oral Cavity/ Oropharynx: Lips are normal with no lesions. Teeth no frank dental caries. Gingiva healthy with no lesions or gingivitis. Oropharynx including tongue, buccal mucosa, floor of mouth, hard and soft palate, uvula and posterior pharynx free of exudates, erythema or lesions with normal symmetry and hydration.  Indirect Laryngoscopy/Nasopharyngoscopy: Visualization of the larynx, hypopharynx and nasopharynx is not possible in this setting with routine examination. Neck: Supple and symmetric with no palpable masses, tenderness or crepitance.  The trachea is midline. Thyroid gland is soft, nontender and symmetric with no masses or enlargement. Parotid and submandibular glands are soft, nontender and symmetric, without masses. Lymphatic: Cervical lymph nodes are without palpable lymphadenopathy or tenderness. Respiratory: Normal respiratory effort without labored breathing. Cardiovascular: Carotid pulse shows regular rate and rhythm Neurologic: Cranial Nerves II through XII are grossly intact with the exception of decreased sensation of the right mid face consistent with trigeminal anesthesia. Eyes: Gaze and Ocular Motility shows some decreased range of motion with upward gaze on the right.  The patient reports slight double vision with this.  There is mild proptosis.  MEDICAL DECISION MAKING: Data Review:  Results for orders placed or performed during the hospital encounter of 03/19/21 (from the past 48 hour(s))  CBC     Status: Abnormal   Collection Time: 03/19/21  3:44 PM  Result Value Ref Range   WBC 12.4 (H) 4.0 - 10.5 K/uL   RBC 3.50 (L) 4.22 - 5.81 MIL/uL   Hemoglobin 12.4 (L) 13.0 - 17.0 g/dL   HCT 34.1 (L) 39.0 - 52.0 %   MCV 97.4 80.0 - 100.0 fL   MCH 35.4 (H) 26.0 - 34.0 pg   MCHC 36.4 (H) 30.0 - 36.0 g/dL   RDW 12.3 11.5 - 15.5 %   Platelets 292 150 - 400 K/uL   nRBC 0.0 0.0 - 0.2 %    Comment: Performed at [facility], [address] [place], [place] [address]  Differential     Status: Abnormal   Collection Time: 03/19/21  3:44 PM  Result Value Ref Range   Neutrophils Relative % 73 %   Neutro Abs 9.0 (H) 1.7 - 7.7 K/uL   Lymphocytes Relative 15 %   Lymphs Abs 1.9 0.7 - 4.0 K/uL   Monocytes Relative 10 %   Monocytes Absolute 1.3 (H) 0.1 -  1.0 K/uL   Eosinophils Relative 1 %   Eosinophils Absolute 0.1 0.0 - 0.5 K/uL   Basophils Relative 0 %   Basophils Absolute 0.1 0.0 - 0.1 K/uL   Immature Granulocytes 1 %   Abs Immature Granulocytes 0.07 0.00 - 0.07 K/uL    Comment: Performed at [facility], [address], [place], [place] [address]  Comprehensive metabolic panel     Status: Abnormal   Collection Time: 03/19/21  3:44 PM  Result Value Ref Range   Sodium 123 (L) 135 - 145 mmol/L   Potassium 4.3 3.5 - 5.1 mmol/L   Chloride 88 (L) 98 - 111 mmol/L   CO2 24 22 - 32 mmol/L   Glucose, Bld 179 (H) 70 - 99 mg/dL    Comment: Glucose reference range applies only to samples taken after fasting for at least 8 hours.   BUN 21 8 - 23 mg/dL   Creatinine, Ser 0.88 0.61 - 1.24 mg/dL   Calcium 9.6 8.9 - 10.3 mg/dL   Total Protein 7.9 6.5 - 8.1 g/dL   Albumin 4.2 3.5 - 5.0 g/dL   AST 14 (L) 15 - 41 U/L   ALT 7 0 - 44 U/L   Alkaline Phosphatase 66 38 - 126 U/L   Total Bilirubin 1.4 (H) 0.3 - 1.2 mg/dL   GFR, Estimated >60 >60 mL/min    Comment: (NOTE) Calculated using the CKD-EPI Creatinine Equation (2021)    Anion gap 11 5 - 15    Comment: Performed at [facility], [address] [place], [place] [address]  Sedimentation rate     Status: Abnormal   Collection Time: 03/19/21  3:44 PM  Result Value Ref Range   Sed Rate 67 (H) 0 - 20 mm/hr    Comment: Performed at [facility], [address] [place], [place] [address]  Procalcitonin - Baseline     Status: None   Collection Time: 03/19/21  3:44 PM  Result Value Ref Range   Procalcitonin <0.10 ng/mL    Comment:        Interpretation: PCT (Procalcitonin) <= 0.5 ng/mL: Systemic infection (sepsis) is not likely. Local bacterial infection is possible. (NOTE)       Sepsis PCT Algorithm           Lower Respiratory Tract                                      Infection PCT Algorithm    ----------------------------     ----------------------------         PCT < 0.25 ng/mL                PCT < 0.10 ng/mL          Strongly encourage             Strongly discourage   discontinuation of antibiotics    initiation of antibiotics    ----------------------------     -----------------------------       PCT 0.25  - 0.50 ng/mL            PCT 0.10 - 0.25 ng/mL               OR       >80% decrease in PCT            Discourage initiation of  antibiotics      Encourage discontinuation           of antibiotics    ----------------------------     -----------------------------         PCT >= 0.50 ng/mL              PCT 0.26 - 0.50 ng/mL               AND        <80% decrease in PCT             Encourage initiation of                                             antibiotics       Encourage continuation           of antibiotics    ----------------------------     -----------------------------        PCT >= 0.50 ng/mL                  PCT > 0.50 ng/mL               AND         increase in PCT                  Strongly encourage                                      initiation of antibiotics    Strongly encourage escalation           of antibiotics                                     -----------------------------                                           PCT <= 0.25 ng/mL                                                 OR                                        > 80% decrease in PCT                                      Discontinue / Do not initiate                                             antibiotics  Performed at [facility], [address] [place], [place] [address]   CBG monitoring, ED     Status: Abnormal   Collection Time:  03/19/21  3:49 PM  Result Value Ref Range   Glucose-Capillary 200 (H) 70 - 99 mg/dL    Comment: Glucose reference range applies only to samples taken after fasting for at least 8 hours.  CBG monitoring, ED     Status: Abnormal   Collection Time: 03/19/21 10:35 PM  Result Value Ref Range   Glucose-Capillary 161 (H) 70 - 99 mg/dL    Comment: Glucose reference range applies only to samples taken after fasting for at least 8 hours.  . CT HEAD WO CONTRAST  Result Date: 03/19/2021 CLINICAL DATA:  headache EXAM: CT HEAD  WITHOUT CONTRAST TECHNIQUE: Contiguous axial images were obtained from the base of the skull through the vertex without intravenous contrast. COMPARISON:  None. FINDINGS: Brain: No evidence of acute infarction, hemorrhage, hydrocephalus, extra-axial collection or mass lesion/mass effect. Vascular: No hyperdense vessel or unexpected calcification. Skull: Normal. Negative for fracture or focal lesion. Sinuses/Orbits: There is complete opacification of the RIGHT maxillary sinus in the majority of the RIGHT-sided ethmoid air cells extending into the hypoplastic RIGHT frontal sinus. There is fat stranding noted along the inferior orbital rim along the maxillary sinus extending into the inferior orbit (series 4, image 16). No definitive intracranial extension is seen, but evaluation is limited. Mild mucosal thickening of the LEFT maxillary sinus. Other: None IMPRESSION: 1. There is complete opacification of the RIGHT maxillary sinus extending into the RIGHT ethmoid air cells and the hypoplastic RIGHT frontal sinus. There is asymmetric fat stranding noted in the inferior RIGHT orbit. This could reflect a periosteal abscess. As such, recommend further dedicated orbit evaluation with CT or MRI with contrast. Electronically Signed   By: Valentino Saxon M.D.   On: 03/19/2021 18:25   MR ORBITS W WO CONTRAST  Result Date: 03/19/2021 CLINICAL DATA:  Pain in right eye. Additional history provided: Patient reports headache on right side of head for the past 2-3 weeks, right-sided facial numbness, occasional blurry vision. EXAM: MRI OF THE ORBITS WITHOUT AND WITH CONTRAST TECHNIQUE: Multiplanar, multi-echo pulse sequences of the orbits and surrounding structures were acquired including fat saturation techniques, before and after intravenous contrast administration. CONTRAST:  7.2mL GADAVIST GADOBUTROL 1 MMOL/ML IV SOLN COMPARISON:  Head CT 03/19/2021. FINDINGS: Orbits: Osseous thinning with foci of possible dehiscence within  the right orbital floor/roof of right maxillary sinus were better appreciated on the head CT performed earlier today. There is adjacent abnormal T2 hyperintense enhancing soft tissue within the inferior extraconal right orbit measuring up to 5 mm in thickness (for instance as seen on series 13, image 29) (series 7, image 26). This is highly concerning for extension of infection from the right maxillary sinus with resultant postseptal orbital cellulitis/phlegmon at this site. This abnormal soft tissue abuts the right inferior rectus muscle. The globes are normal in size and contour. The extraocular muscles and optic nerve sheath complexes are otherwise symmetric and unremarkable. Subtle right proptosis. Visualized sinuses: The right maxillary sinus is completely opacified with complex material. Osseous thinning with foci of possible dehiscence in the right orbital floor/roof of right maxillary sinus, better appreciated on the head CT performed earlier today. Near complete T2 hyperintense opacification of the anterior right ethmoid air cells. Trace mucosal thickening elsewhere within the ethmoid sinuses, and within the visualized left maxillary sinus. Soft tissues: T2 hyperintense signal abnormality and enhancement within the right periorbital soft tissues. Limited intracranial: No evidence of acute intracranial abnormality within the field of view. These results were called by telephone at the time  of interpretation on 03/19/2021 at 8:25 pm to provider Dr. Joni Fears, who verbally acknowledged these results. IMPRESSION: The right maxillary sinus is completely opacified with complex material. Associated osseous thinning and possible foci of dehiscence in the right orbital floor/roof of maxillary sinus, better appreciated on the head CT performed earlier today. There is adjacent abnormal enhancing soft tissue within the inferior extraconal right orbit, measuring up to 5 mm in thickness. This is favored to reflect  extension of infection from the right maxillary sinus with resultant right post-septal orbital cellulitis/phlegmon. This abnormal enhancing soft tissue abuts the right inferior rectus muscle. Subtle right proptosis. Signal abnormality and enhancement within the right periorbital soft tissues, likely reflecting a component of periorbital cellulitis. Additional paranasal sinus disease, as described. Most notably, there is severe partial opacification of the anterior right ethmoid air cells (overall right ostiomeatal unit obstructive pattern). ENT and ophthalmology consultation recommended. Electronically Signed   By: Kellie Simmering D.O.   On: 03/19/2021 20:31  .   PROCEDURE: Procedure: Diagnostic Fiberoptic Nasolaryngoscopy Diagnosis: Right maxillary sinusitis, orbital cellulitis Indications: Evaluate sinusitis Findings: There is significant mucosal edema with mucopurulence noted in the right middle meatus.  No polyps are seen.  I do not see any tissue necrosis of the lateral nasal wall. Description of Procedure: After discussing procedure and risks  (primarily nose bleed) with the patient, the nose was anesthetized with topical Lidocaine 4% and decongested with phenylephrine. A flexible fiberoptic scope was passed through the nasal cavity. The nasal cavity was inspected and the scope passed through the Nasopharynx to the region of the hypopharynx and larynx. The patient was instructed to phonate to assess vocal cord mobility. The tongue was extended to evaluate the tongue base completely. Valsalva was performed to insufflate the hypopharynx for improved examination. Findings are as noted above. The scope was withdrawn. The patient tolerated the procedure well.  ASSESSMENT: Right maxillary sinusitis with dense complex secretions noted on MRI.  Radiology noted bony erosion involving the floor of the right orbit and the patient has anesthesia of the right cheek as well as decreased upward gaze.  This seems a  little unusual for invasive fungal sinusitis, however the bone erosion does raise that concern although no necrosis is seen in the intranasal tissues.  The concern however would be that there could be necrotic tissue involving the roof of the maxillary sinus and he ultimately might require aggressive debridement into the orbit.  While I can certainly offer maxillary antrostomy for drainage of the sinus, I am concerned that delaying transfer might ultimately delay aggressive debridement if this does indeed turn out to be fungal. It is certainly very unusual to have orbital cellulitis spreading from the maxillary sinus rather than the ethmoids.  PLAN: I talked with Dr. Amada Jupiter at [facility] and she agreed to transfer of the patient for management there given the concern of possible early invasive fungal sinusitis.  These recommendations were discussed with the patient and he is in agreement.   Daniel Nearing, MD 03/19/2021 11:03 PM

## 2021-03-19 NOTE — ED Triage Notes (Signed)
See first nurse note- pt to ER with severe right sided headache- significantly behind the right eye. States it is more intense at night. Reports wearing sunglasses helps. Reports recent medication change- has been taken off 90mg  imdur and has been taking amlodipine. Pt has noticed elevated blood pressures but typically he notices this when the pain is at its worst, systolic as high at 158. Bilateral grip strength equal. No issues with gait.   Pt also reports right sided face numbness this week. Reports being unable to tolerate his cpap mask last night- states it felt like pins and needles. Also reports some new blurred vision present to right eye this week. Also reports right sided nasal congestion.   Cbg in triage 200.

## 2021-03-19 NOTE — Consult Note (Signed)
Received page to admit a 6 yom with pre-diabetes. His chief concern was headache for two weeks.   In the ED, CT head w/o contrast was read as: there is complete opacification of the RIGHT maxillary sinus extending into the RIGHT ethmoid air cells and the hypoplastic RIGHT frontal sinus. There is asymmetric fat stranding noted in the inferior RIGHT orbit. This could reflect a periosteal abscess. As such, recommend further dedicated orbit evaluation with CT or MRI with contrast.  EDP ordered MRI fo the orbits w/ wo contrast. Per EDP there has been intermittent blurry vision changes.   Given asymmetric fat stranding noted in the inferior RIGHT orbit, I recommended, EDP run the reading and patient presentation with ophtalmologist on call to ensure this is a case that we can appropriately handle at [facility] without risk of compromising patient's vision due to delay of care.   Message was sent via [place].   Dr. Tobie Poet [facility]

## 2021-03-19 NOTE — ED Provider Notes (Signed)
Emergency Medicine Provider Triage Evaluation Note  Daniel Holden , a 75 y.o. male  was evaluated in triage.  Pt complains of severe r-sided headache and facial numbness for the last week. He denies FCS, CP, or SOB. No visual disturbance, facial droop, extremity weakness, or slurred speech. .  Review of Systems  Positive: Headache, facial paresthesias Negative: CP, SOB  Physical Exam  BP 137/73 (BP Location: Left Arm)   Pulse 72   Temp 98.4 F (36.9 C) (Oral)   Resp 18   Ht 5\' 7"  (1.702 m)   Wt 88.5 kg   SpO2 96%   BMI 30.54 kg/m  Gen:   Awake, no distress  NAD Resp:  Normal effort CTA MSK:   Moves extremities without difficulty  Other:  [facility] RRR  Medical Decision Making  Medically screening exam initiated at 3:53 PM.  Appropriate orders placed.  Daniel Holden was informed that the remainder of the evaluation will be completed by another provider, this initial triage assessment does not replace that evaluation, and the importance of remaining in the ED until their evaluation is complete.  Patient with ED evaluation of r-sided headache and facial paresthesias.    Melvenia Needles, PA-C 03/19/21 1554    Vanessa [place] Walk, MD 03/19/21 [phone]

## 2021-03-19 NOTE — ED Provider Notes (Signed)
[facility] Emergency Department Provider Note  ____________________________________________   Event Date/Time   First MD Initiated Contact with Patient 03/19/21 1706     (approximate)  I have reviewed the triage vital signs and the nursing notes.   HISTORY  Chief Complaint Headache    HPI Daniel Holden is a 75 y.o. male with hypertension and hyperlipidemia, coronary disease who comes in with concerns for headache.  Patient reports a headache on the right side of his head for the past 2-3 weeks off and on with some right-sided facial numbness.  Patient reports the pain is intermittent, severe, nothing makes it better, nothing makes it worse.  He states that he occasionally has some blurry vision as well.  He reports being seen by an ophthalmologist and having a normal eye exam at that time pressures.  He reports also seeing an ENT doctor given he has some pain on his jaw and on his right maxillary area that was reassuring as well.  He states that he did recently get taken off Imdur to see if that would help his symptoms but has not had any improvement.  Patient is now on amlodipine instead.  He denies any chest pain, shortness of breath, abdominal pain or leg or arm weakness or numbness.     Past Medical History:  Diagnosis Date   Allergy    Angina of effort ([place])    Anxiety    CAD (coronary artery disease), native coronary artery    coronary CTA showing aortic atherosclerosis with very high coronary Ca score at 4630 with severely calcified RCA and LAD > 70% and 50-69% prox to mid LCx.     Carpal tunnel syndrome    HLD (hyperlipidemia)    Hypertension    Lung nodule    Obesity    Pancreatitis 2015   Sleep apnea     Patient Active Problem List   Diagnosis Date Noted   Hypotension due to hypovolemia 01/23/2021   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 intake 01/11/2021   Hepatic steatosis 10/25/2020   Abnormal cardiac CT  angiography 10/04/2020   Angina, class II ([place]) 10/04/2020   Angina of effort ([place]) 08/23/2020   Lung nodules 08/05/2020   Abnormal abdominal CT scan 08/05/2020   Carpal tunnel syndrome, right upper limb 07/25/2020   Annual physical exam 07/14/2020   Neuropathy 07/14/2020   Primary hypertension 07/14/2020   OSA on CPAP 12/14/2019   Neck ache 12/14/2019   Anxiety 12/14/2019   Class 1 obesity due to excess calories with serious comorbidity and body mass index (BMI) of 32.0 to 32.9 in adult 12/14/2019    Past Surgical History:  Procedure Laterality Date   APPENDECTOMY     Patient states he did not have an appendectomy and does not know why this is in his chart. [place] 06/28/2020   CARPAL TUNNEL RELEASE     KNEE ARTHROSCOPY WITH MEDIAL MENISECTOMY Left 06/25/2019   Procedure: KNEE ARTHROSCOPY WITH MEDIAL MENISECTOMY;  Surgeon: Thornton Park, MD;  Location: [facility] ORS;  Service: Orthopedics;  Laterality: Left;   LEFT HEART CATH AND CORONARY ANGIOGRAPHY N/A 10/04/2020   Procedure: LEFT HEART CATH AND CORONARY ANGIOGRAPHY;  Surgeon: Leonie Man, MD;  Location: [place] [facility];  Service: Cardiovascular;  Laterality: N/A;   SHOULDER SURGERY     TONSILLECTOMY      Prior to Admission medications   Medication Sig Start Date End Date Taking? Authorizing Provider  ALPRAZolam Duanne Moron) 0.25  MG tablet Take 1 tablet (0.25 mg total) by mouth at bedtime as needed for sleep. 02/28/21   Cletis Athens, MD  amLODipine (NORVASC) 5 MG tablet Take 1 tablet (5 mg total) by mouth daily. 03/15/21   Sueanne Margarita, MD  aspirin 81 MG EC tablet Take 81 mg by mouth daily.    [provider]  atorvastatin (LIPITOR) 80 MG tablet Take 1 tablet (80 mg total) by mouth daily. 03/15/21   Sueanne Margarita, MD  benazepril (LOTENSIN) 20 MG tablet Take 1 tablet (20 mg total) by mouth daily. 03/15/21   Sueanne Margarita, MD  Cholecalciferol 125 MCG (5000 UT) TABS Take 5,000 Units by mouth daily.    [provider]   cyanocobalamin 1000 MCG tablet Take 1,000 mcg by mouth daily.    [provider]  fluticasone (FLONASE) 50 MCG/ACT nasal spray Place 2 sprays into both nostrils daily.  10/27/13   [provider]  gabapentin (NEURONTIN) 300 MG capsule Take 300 mg by mouth 2 (two) times daily.    [provider]  melatonin 3 MG TABS tablet Take 10 mg by mouth at bedtime.    [provider]  metoprolol tartrate (LOPRESSOR) 50 MG tablet Take 1 tablet (50 mg total) by mouth 2 (two) times daily. 03/15/21   Sueanne Margarita, MD  naproxen sodium (ALEVE) 220 MG tablet Take 220 mg by mouth 2 (two) times daily.    [provider]  omeprazole (PRILOSEC OTC) 20 MG tablet Take 10 mg by mouth daily.    [provider]  ondansetron (ZOFRAN-ODT) 8 MG disintegrating tablet Take 8 mg by mouth every 8 (eight) hours as needed. Patient not taking: Reported on 03/15/2021    [provider]  ranolazine (RANEXA) 1000 MG SR tablet Take 1 tablet (1,000 mg total) by mouth 2 (two) times daily. 03/15/21   Sueanne Margarita, MD  tiZANidine (ZANAFLEX) 4 MG tablet Take 4 mg by mouth at bedtime. Patient not taking: Reported on 03/15/2021    [provider]    Allergies Dilaudid [hydromorphone hcl]  Family History  Problem Relation Age of Onset   CVA Father     Social History Social History   Tobacco Use   Smoking status: Never   Smokeless tobacco: Never  Vaping Use   Vaping Use: Never used  Substance Use Topics   Alcohol use: Yes    Alcohol/week: 2.0 standard drinks    Types: 2 Cans of beer per week    Comment: 6 per day   Drug use: No      Review of Systems Constitutional: No fever/chills Eyes: No visual changes. ENT: No sore throat. Cardiovascular: Denies chest pain. Respiratory: Denies shortness of breath. Gastrointestinal: No abdominal pain.  No nausea, no vomiting.  No diarrhea.  No constipation. Genitourinary: Negative for dysuria. Musculoskeletal:  Negative for back pain. Skin: Negative for rash. Neurological: Positive headache, blurry vision, right-sided facial numbness All other ROS negative ____________________________________________   PHYSICAL EXAM:  VITAL SIGNS: ED Triage Vitals  Enc Vitals Group     BP 03/19/21 1544 137/73     Pulse Rate 03/19/21 1544 72     Resp 03/19/21 1544 18     Temp 03/19/21 1544 98.4 F (36.9 C)     Temp Source 03/19/21 1544 Oral     SpO2 03/19/21 1544 96 %     Weight 03/19/21 1544 195 lb (88.5 kg)     Height 03/19/21 1544 5' 7"  (1.702  m)     Head Circumference --      Peak Flow --      Pain Score 03/19/21 1552 8     Pain Loc --      Pain Edu? --      Excl. in [place]? --     Constitutional: Alert and oriented. Well appearing and in no acute distress. Eyes: Conjunctivae are normal. EOMI. pupil is equal and reactive. Head: Atraumatic. Nose: No congestion/rhinnorhea. Mouth/Throat: Mucous membranes are moist.   Neck: No stridor. Trachea Midline. FROM Cardiovascular: Normal rate, regular rhythm. Grossly normal heart sounds.  Good peripheral circulation. Respiratory: Normal respiratory effort.  No retractions. Lungs CTAB. Gastrointestinal: Soft and nontender. No distention. No abdominal bruits.  Musculoskeletal: No lower extremity tenderness nor edema.  No joint effusions. Neurologic:  Normal speech and language. No gross focal neurologic deficits are appreciated.  Cranial nerves appear intact.  Equal strength in arms and legs.  He does have some sensation changes near the right maxillary area. Skin:  Skin is warm, dry and intact. No rash noted. Psychiatric: Mood and affect are normal. Speech and behavior are normal. GU: Deferred   ____________________________________________   LABS (all labs ordered are listed, but only abnormal results are displayed)  Labs Reviewed  CBC - Abnormal; Notable for the following components:      Result Value   WBC 12.4 (*)    RBC 3.50 (*)    Hemoglobin  12.4 (*)    HCT 34.1 (*)    MCH 35.4 (*)    MCHC 36.4 (*)    All other components within normal limits  DIFFERENTIAL - Abnormal; Notable for the following components:   Neutro Abs 9.0 (*)    Monocytes Absolute 1.3 (*)    All other components within normal limits  COMPREHENSIVE METABOLIC PANEL - Abnormal; Notable for the following components:   Sodium 123 (*)    Chloride 88 (*)    Glucose, Bld 179 (*)    AST 14 (*)    Total Bilirubin 1.4 (*)    All other components within normal limits  CBG MONITORING, ED - Abnormal; Notable for the following components:   Glucose-Capillary 200 (*)    All other components within normal limits  PROTIME-INR  APTT  CBG MONITORING, ED   ____________________________________________   ED ECG REPORT I, Vanessa St. Jo, the attending physician, personally viewed and interpreted this ECG.  Normal sinus rate of 72, no ST elevation, no T wave inversions, normal intervals ____________________________________________  RADIOLOGY   Official radiology report(s): CT HEAD WO CONTRAST  Result Date: 03/19/2021 CLINICAL DATA:  headache EXAM: CT HEAD WITHOUT CONTRAST TECHNIQUE: Contiguous axial images were obtained from the base of the skull through the vertex without intravenous contrast. COMPARISON:  None. FINDINGS: Brain: No evidence of acute infarction, hemorrhage, hydrocephalus, extra-axial collection or mass lesion/mass effect. Vascular: No hyperdense vessel or unexpected calcification. Skull: Normal. Negative for fracture or focal lesion. Sinuses/Orbits: There is complete opacification of the RIGHT maxillary sinus in the majority of the RIGHT-sided ethmoid air cells extending into the hypoplastic RIGHT frontal sinus. There is fat stranding noted along the inferior orbital rim along the maxillary sinus extending into the inferior orbit (series 4, image 16). No definitive intracranial extension is seen, but evaluation is limited. Mild mucosal thickening of the LEFT  maxillary sinus. Other: None IMPRESSION: 1. There is complete opacification of the RIGHT maxillary sinus extending into the RIGHT ethmoid air cells and the hypoplastic RIGHT frontal sinus. There  is asymmetric fat stranding noted in the inferior RIGHT orbit. This could reflect a periosteal abscess. As such, recommend further dedicated orbit evaluation with CT or MRI with contrast. Electronically Signed   By: Valentino Saxon M.D.   On: 03/19/2021 18:25    ____________________________________________   PROCEDURES  Procedure(s) performed (including Critical Care):  Procedures   ____________________________________________   INITIAL IMPRESSION / ASSESSMENT AND PLAN / ED COURSE  WILLEY DUE was evaluated in Emergency Department on 03/19/2021 for the symptoms described in the history of present illness. He was evaluated in the context of the global COVID-19 pandemic, which necessitated consideration that the patient might be at risk for infection with the SARS-CoV-2 virus that causes COVID-19. Institutional protocols and algorithms that pertain to the evaluation of patients at risk for COVID-19 are in a state of rapid change based on information released by regulatory bodies including the CDC and federal and state organizations. These policies and algorithms were followed during the patient's care in the ED.    Patient is a well-appearing 75 year old who comes in with significant facial pain.  Given the numbness electrolytes were ordered.  CT head ordered evaluate for any evidence of mass.  Does not sound like a stroke given the sensation changes and is very much just on one side near the nose.  Could be a cluster headache versus sinusitis.  We will start off with a migraine cocktail to see if that will help.  We will add on ESR to look for temporal arteritis.  Patient sodium is low at 123 which is definitely different from his baseline.  Patient looks euvolemic on examination.  His liver  function is slightly elevated but he states that he does have a history of fatty liver.  Patient had a CT scan concerning for right maxillary sinus opacification with some asymmetric fat stranding into the right orbit.  They recommended a MRI with contrast of the orbits versus CT of the orbits.  CT findings and the hyponatremia will discuss possible team for admission started on IV Unasyn with MRI orbits pending for possible rule out abscess.  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 status migrainosus, intractable      MEDICATIONS GIVEN DURING THIS VISIT:  Medications  Ampicillin-Sulbactam (UNASYN) 3 g in sodium chloride 0.9 % 100 mL IVPB (has no administration in time range)  sodium chloride flush (NS) 0.9 % injection 3 mL (3 mLs Intravenous Given 03/19/21 1720)  metoCLOPramide (REGLAN) injection 10 mg (10 mg Intravenous Given 03/19/21 1748)  diphenhydrAMINE (BENADRYL) injection 12.5 mg (12.5 mg Intravenous Given 03/19/21 1748)  acetaminophen (TYLENOL) tablet 1,000 mg (1,000 mg Oral Given 03/19/21 1748)  sodium chloride 0.9 % bolus 500 mL (0 mLs Intravenous Stopped 03/19/21 1825)     ED Discharge Orders     None        Note:  This document was prepared using Dragon voice recognition software and may include unintentional dictation errors.    Vanessa Vera Cruz, MD 03/19/21 Bosie Helper

## 2021-03-19 NOTE — ED Triage Notes (Addendum)
FIRST NURSE NOTE:  Pt reports R side HA x  2 weeks off and on, also reports R side facial numbness this week

## 2021-03-19 NOTE — H&P (Addendum)
History and Physical   Daniel Holden VCB:[id number] DOB: 05/18/1946 DOA: 03/19/2021  PCP: Cletis Athens, MD  Outpatient Specialists: Dr. Radford Pax, cardiology Patient coming from: Home  I have personally briefly reviewed patient's old medical records in [place].  Chief Concern: Headache, numbness  HPI: Daniel Holden is a 75 y.o. male with medical history significant for glucose intolerance, A1c of 6.4 in July 2021, hypertension, hyperlipidemia, obstructive sleep apnea, CAD, on CPAP nightly, pulmonary emphysema, obesity class I, anxiety, diabetic neuropathy, GERD, insomnia, who presents emergency department for chief concerns of headache and right-sided facial numbness.  He reports that the headache started two weeks ago. He denies facial trauma. He reports he has never had this headache before. He reports that the headache starts in the area of his right eye that radiates to the back of his head, he described it as throbbing. At its peak, the pain was 9/10 nearly as much as pancreatitis.   He reports the numbness started about three-four days ago, he also endorses molar pain. He endorses abnormal smell of acetic acid in the last 2 weeks. He endorses nausea and lack of appetite for 2 weeks.  He further endorses intermittent blurry vision over the course of 2 weeks.  He has had poor sleep, despite wearing his cpap. He reports the cpap mask causes him pain. He denies drainage. He has been using the lavage and does not perceive drainage.   He states that years ago, he used power lavage daily and stopped five years ago. He states that he used the power lavage again last week, 2-3x.  This is the same power lavage machine that he used more than 5 years ago.  He denies chest pain, shortness of breath, abdominal pain, dysuria, hematuria, diarrhea. He endorses baseline constipation. He denies fever at home. He denies vomiting. He denies sick contacts.   He denies recent travel, incarceration,  cave exposures.   Social history: He lives at home with his wife.  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  He is former tobacco user, quit 40 years ago. He was formally a heavy drinker and quit several years ago after he developed cardiac disease.  His last etoh drink was 1 beer about one week ago. He denies IVDU.  Vaccination history: He is vaccinated for covid 19, 3 doses of Pfizer  ROS: Constitutional: no weight change, no fever ENT/Mouth: no sore throat, no rhinorrhea Eyes: + eye pain, + vision changes, + he endorses double vision with superior gaze Cardiovascular: no chest pain, no dyspnea,  no edema, no palpitations Respiratory: no cough, no sputum, no wheezing Gastrointestinal: no nausea, no vomiting, no diarrhea, no constipation Genitourinary: no urinary incontinence, no dysuria, no hematuria Musculoskeletal: no arthralgias, no myalgias Skin: no skin lesions, no pruritus, Neuro: + weakness, no loss of consciousness, no syncope Psych: no anxiety, no depression, + decrease appetite Heme/Lymph: no bruising, no bleeding  ED Course: Discussed with emergency medicine provider, patient requiring hospitalization for chief concerns of right maxillary sinus extending into the right ethmoid air cells and hypoplastic right frontal sinus.  Asymmetric fat stranding noted in the inferior right orbit.  Vitals in the emergency department was remarkable for temperature of 98.4, respiration rate of 18, heart rate of 72, initial blood pressure 137/73, SPO2 of 96% on room air.  Labs in the emergency department was remarkable for sodium 123, potassium 4.3, chloride 88, bicarb 24, BUN of 21, serum  creatinine of 0.88, nonfasting blood glucose 179, EGFR greater than 60.  WBC 12.4, hemoglobin 12.4, platelets 292.  In the emergency department patient was given acetaminophen 1000 mg, Unasyn 3 g IV once, Benadryl 12.5 mg, Reglan 10 mg IV.  I discussed my concerns  with the ED provider and recommended that ophthalmology be consulted for assessment if the patient is appropriate for [facility] admission, regarding my concerns with compromising patient's vision due to delay of care that at transfer would warrant.  Assessment/Plan  Principal Problem:   Orbital abscess, left Active Problems:   OSA on CPAP   Anxiety   Class 1 obesity due to excess calories with serious comorbidity and body mass index (BMI) of 32.0 to 32.9 in adult   Neuropathy   Primary hypertension   Hepatic steatosis   Abscess   # Headache and vision changes-extensive work-up including CT of the head without contrast, MRI of the orbits with and without contrast # Leukocytosis with increased monocytes - Differentials include bacterial sinusitis, allergic fungal sinusitis, versus invasive fungal sinusitis - Mucor mycosis cannot be excluded at this time - His risk factors for invasive fungal sinusitis, though low, includes prediabetes not on antidiabetic medications, oral or insulins, use of power Nettie/power lavage to clear out his sinuses - MRI of the orbits was read as osseous thinning and possible foci of dehiscence in the right orbital floor/roof of maxillary sinus.  Adjacent abnormal enhancing soft tissue within the inferior extraconal right orbit, measuring up to 5 mm in thickness.  This is favored to reflect extension of infection from the right maxillary sinus with resultant right post septal orbital cellulitis/phlegmon.  Abnormal enhancing soft tissue abuts the right inferior rectus muscle.  Subtle right proptosis. - Ophthalmology was consulted and recommends IV antibiotic broad-spectrum and follow with MRI of the orbits.  - Upon MRI of the orbits read results, ophthalmology recommends ENT consultation for debridement and/or recommendations to transfer to tertiary center for ENT service - ENT provider, Dr. Richardson Landry recommends CT of maxillofacial w/o contrast with [employer] - ENT consulted  and performed bedside scoping and states there is pus visualized with bedside ENT scope - Continue Unasyn 3 g IV every 6 hours - Add MRSA PCR, hemoglobin A1c, Legionella urine antigen, procalcitonin, sed rate - Dr. Richardson Landry discussed with ENT attending at [facility], Dr. Starr [place] who states that she has accepted the patient - Attempts were made to call transfer center, [facility] has declined acceptance of Mr. Casique at this time due to no bed availability - [facility] transfer center recommends calling during the normal business hours between 9 AM and 5 PM to verify availability of bed - I will try [facility] transfer center - Discussed with cross coverage provider that I am awaiting [facility] transfer center call back, there is no bed available to please try [facility] - If patient does not have a bed, recommend a.m. team to consider [facility] and for discussion with Zac[facility] Pontes as well and call [facility] back bed availability - Blood cultures x2 have been ordered  # Glucose intolerance/dysglycemia - - Check A1c - Insulin SSI with at bedtime coverage ordered  # Neuropathy-gabapentin 300 mg p.o. twice daily  # DVT prophylaxis-pharmacologic DVT prophylaxis has not been initiated by myself due to anticipation of possible surgery  Chart reviewed.   DVT prophylaxis:   Code Status: full code  Diet: NPO after midnight Family Communication: Discussed with spouse at bedside Disposition Plan: Pending clinical course Consults called: ENT, ophthalmology Admission status: MedSurg,  observation, telemetry  Past Medical History:  Diagnosis Date   Allergy    Angina of effort ([place])    Anxiety    CAD (coronary artery disease), native coronary artery    coronary CTA showing aortic atherosclerosis with very high coronary Ca score at 4630 with severely calcified RCA and LAD > 70% and 50-69% prox to mid LCx.     Carpal tunnel syndrome    HLD (hyperlipidemia)    Hypertension    Lung nodule    Obesity    Pancreatitis 2015   Sleep  apnea    Past Surgical History:  Procedure Laterality Date   APPENDECTOMY     Patient states he did not have an appendectomy and does not know why this is in his chart. [place] 06/28/2020   CARPAL TUNNEL RELEASE     KNEE ARTHROSCOPY WITH MEDIAL MENISECTOMY Left 06/25/2019   Procedure: KNEE ARTHROSCOPY WITH MEDIAL MENISECTOMY;  Surgeon: Thornton Park, MD;  Location: [facility];  Service: Orthopedics;  Laterality: Left;   LEFT HEART CATH AND CORONARY ANGIOGRAPHY N/A 10/04/2020   Procedure: LEFT HEART CATH AND CORONARY ANGIOGRAPHY;  Surgeon: Leonie Man, MD;  Location: [facility];  Service: Cardiovascular;  Laterality: N/A;   SHOULDER SURGERY     TONSILLECTOMY     Social History:  reports that he has never smoked. He has never used smokeless tobacco. He reports current alcohol use of about 2.0 standard drinks per week. He reports that he does not use drugs.  Allergies  Allergen Reactions   Dilaudid [Hydromorphone Hcl] Nausea And Vomiting   Family History  Problem Relation Age of Onset   CVA Father    Family history: Family history reviewed and not pertinent  Prior to Admission medications   Medication Sig Start Date End Date Taking? Authorizing Provider  ALPRAZolam (XANAX) 0.25 MG tablet Take 1 tablet (0.25 mg total) by mouth at bedtime as needed for sleep. 02/28/21   Cletis Athens, MD  amLODipine (NORVASC) 5 MG tablet Take 1 tablet (5 mg total) by mouth daily. 03/15/21   Sueanne Margarita, MD  aspirin 81 MG EC tablet Take 81 mg by mouth daily.    [provider]  atorvastatin (LIPITOR) 80 MG tablet Take 1 tablet (80 mg total) by mouth daily. 03/15/21   Sueanne Margarita, MD  benazepril (LOTENSIN) 20 MG tablet Take 1 tablet (20 mg total) by mouth daily. 03/15/21   Sueanne Margarita, MD  Cholecalciferol 125 MCG (5000 UT) TABS Take 5,000 Units by mouth daily.    [provider]  cyanocobalamin 1000 MCG tablet Take 1,000 mcg by mouth daily.    [provider]   fluticasone (FLONASE) 50 MCG/ACT nasal spray Place 2 sprays into both nostrils daily.  10/27/13   [provider]  gabapentin (NEURONTIN) 300 MG capsule Take 300 mg by mouth 2 (two) times daily.    [provider]  melatonin 3 MG TABS tablet Take 10 mg by mouth at bedtime.    [provider]  metoprolol tartrate (LOPRESSOR) 50 MG tablet Take 1 tablet (50 mg total) by mouth 2 (two) times daily. 03/15/21   Sueanne Margarita, MD  naproxen sodium (ALEVE) 220 MG tablet Take 220 mg by mouth 2 (two) times daily.    [provider]  omeprazole (PRILOSEC OTC) 20 MG tablet Take 10 mg by mouth daily.    [provider]  ondansetron (ZOFRAN-ODT) 8 MG disintegrating tablet Take 8 mg by mouth  every 8 (eight) hours as needed. Patient not taking: Reported on 03/15/2021    [provider]  ranolazine (RANEXA) 1000 MG SR tablet Take 1 tablet (1,000 mg total) by mouth 2 (two) times daily. 03/15/21   Sueanne Margarita, MD  tiZANidine (ZANAFLEX) 4 MG tablet Take 4 mg by mouth at bedtime. Patient not taking: Reported on 03/15/2021    [provider]   Physical Exam: Vitals:   03/19/21 1544 03/19/21 1825 03/19/21 2311  BP: 137/73 130/80 130/81  Pulse: 72 75 74  Resp: 18 17 18   Temp: 98.4 F (36.9 C) 98.5 F (36.9 C)   TempSrc: Oral    SpO2: 96% 98% 98%  Weight: 88.5 kg    Height: 5' 7"  (1.702 m)     Constitutional: appears age-appropriate, NAD, calm, comfortable Eyes: PERRL, lids and conjunctivae normal.  Mild right eye orbital swelling.  Minimally noticeable. ENMT: Mucous membranes are moist. Posterior pharynx clear of any exudate or lesions. Age-appropriate dentition. Hearing appropriate Neck: normal, supple, no masses, no thyromegaly Respiratory: clear to auscultation bilaterally, no wheezing, no crackles. Normal respiratory effort. No accessory muscle use.  Cardiovascular: Regular rate and rhythm, no murmurs / rubs / gallops. No extremity edema. 2+  pedal pulses. No carotid bruits.  Abdomen: Mildly obese abdomen, no tenderness, no masses palpated, no hepatosplenomegaly. Bowel sounds positive.  Musculoskeletal: no clubbing / cyanosis. No joint deformity upper and lower extremities. Good ROM, no contractures, no atrophy. Normal muscle tone.  Skin: no rashes, lesions, ulcers. No induration Neurologic: Sensation intact. Strength 5/5 in all 4.  Psychiatric: Normal judgment and insight. Alert and oriented x 3. Normal mood.   EKG: independently reviewed, showing sinus rhythm with rate of 72, QTc 407  Chest x-ray on Admission: I personally reviewed and I agree with radiologist reading as below.  CT HEAD WO CONTRAST  Result Date: 03/19/2021 CLINICAL DATA:  headache EXAM: CT HEAD WITHOUT CONTRAST TECHNIQUE: Contiguous axial images were obtained from the base of the skull through the vertex without intravenous contrast. COMPARISON:  None. FINDINGS: Brain: No evidence of acute infarction, hemorrhage, hydrocephalus, extra-axial collection or mass lesion/mass effect. Vascular: No hyperdense vessel or unexpected calcification. Skull: Normal. Negative for fracture or focal lesion. Sinuses/Orbits: There is complete opacification of the RIGHT maxillary sinus in the majority of the RIGHT-sided ethmoid air cells extending into the hypoplastic RIGHT frontal sinus. There is fat stranding noted along the inferior orbital rim along the maxillary sinus extending into the inferior orbit (series 4, image 16). No definitive intracranial extension is seen, but evaluation is limited. Mild mucosal thickening of the LEFT maxillary sinus. Other: None IMPRESSION: 1. There is complete opacification of the RIGHT maxillary sinus extending into the RIGHT ethmoid air cells and the hypoplastic RIGHT frontal sinus. There is asymmetric fat stranding noted in the inferior RIGHT orbit. This could reflect a periosteal abscess. As such, recommend further dedicated orbit evaluation with CT or  MRI with contrast. Electronically Signed   By: Valentino Saxon M.D.   On: 03/19/2021 18:25   CT MAXILLOFACIAL WO CONTRAST  Result Date: 03/19/2021 CLINICAL DATA:  Initial evaluation for concern of invasive right maxillary sinusitis with right orbital involvement. EXAM: CT MAXILLOFACIAL WITHOUT CONTRAST TECHNIQUE: Multidetector CT imaging of the maxillofacial structures was performed. Multiplanar CT image reconstructions were also generated. COMPARISON:  Comparison made with previous head CT and orbital MRI from 03/19/2021. FINDINGS: Orbits/Sinuses: Right maxillary sinus is completely opacified with complex heterogeneous material. The right ethmoidal air cells are also  largely opacified. Opacification of the hypoplastic right frontal sinus and frontoethmoidal recess. Mucosal thickening within the right sphenoid sinus. Thin section bone window algorithm was performed through the sinuses. Images demonstrate multifocal areas of osseous thinning and dehiscence about the right orbital floor (series 6, image 54), confirming findings seen on prior studies. Additionally, the anterior and posterior walls of the right maxillary sinus are also thin with scattered areas of dehiscence (series 3, image 58). Hazy inflammatory stranding seen within the right retro antral fat, consistent with invasive sinusitis (series 2, image 53). The right pre maxillary soft tissues are relatively preserved at this time. Stranding with inflammatory changes seen involving the extraconal fat of the inferior right orbit, again concerning for postseptal cellulitis related to invasive sinusitis (series 5, image 30). Mild mass effect on the adjacent right inferior rectus muscle which is slightly bowed superiorly (series 5, image 31). Additionally, there is possible thinning and dehiscence at the inferior right lamina papyracea (series 6, image 43). The right lamina papyracea is slightly bowed laterally into the bony right orbit due to underlying  sinus disease (series 2, image 92). No discrete abscess or drainable fluid collection seen on this noncontrast CT. Remainder of the right orbital fat is relatively maintained at this time. Mild right-sided proptosis again noted. Right globe and optic nerve otherwise unremarkable. Extra-ocular muscles otherwise unremarkable. Lacrimal gland normal. No other abnormality about the right lacrimal apparatus. Contralateral left globe and orbit within normal limits. Other than mild mucosal thickening about the left maxillary sinus, the remainder of the left-sided paranasal sinuses are otherwise relatively clear without abnormality. Mastoid air cells and middle ear cavities are well pneumatized and free of fluid. Soft tissues: Probable mild soft tissue swelling about the right periorbital soft tissues, suggesting possible mild periorbital cellulitis. No discrete soft tissue abscess or collection. Limited intracranial: Unremarkable. No visible evidence for intracranial spread of infection at this time. IMPRESSION: 1. 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 Associated thinning and dehiscence at the right orbital floor with inflammatory changes and phlegmon involving the inferior postseptal bony right orbit. Right lamina papyracea thinned and possibly dehisced as well. No discrete intraorbital fluid collection or abscess. Associated mild right-sided proptosis. Additional thinning and dehiscence elsewhere about the right maxillary sinus, with evidence for invasion into the right retro antral fat. 2. Mild soft tissue swelling about the right periorbital soft tissues, suggesting associated periorbital cellulitis. Critical results discussed by telephone at the time of interpretation on 03/19/2021 at 10:49 pm to provider Shady Bradish , who verbally acknowledged these results. Per Dr. Tobie Poet,  emergent ENT consultation has already been obtained. Electronically Signed   By: Jeannine Boga M.D.   On: 03/19/2021 23:01   MR ORBITS W WO CONTRAST  Result Date: 03/19/2021 CLINICAL DATA:  Pain in right eye. Additional history provided: Patient reports headache on right side of head for the past 2-3 weeks, right-sided facial numbness, occasional blurry vision. EXAM: MRI OF THE ORBITS WITHOUT AND WITH CONTRAST TECHNIQUE: Multiplanar, multi-echo pulse sequences of the orbits and surrounding structures were acquired including fat saturation techniques, before and after intravenous contrast administration. CONTRAST:  7.46m GADAVIST GADOBUTROL 1 MMOL/ML IV SOLN COMPARISON:  Head CT 03/19/2021. FINDINGS: Orbits: Osseous thinning with foci of possible dehiscence within the right orbital floor/roof of right maxillary sinus were better appreciated on the head CT performed earlier today. There is adjacent abnormal T2 hyperintense enhancing soft tissue  within the inferior extraconal right orbit measuring up to 5 mm in thickness (for instance as seen on series 13, image 29) (series 7, image 26). This is highly concerning for extension of infection from the right maxillary sinus with resultant postseptal orbital cellulitis/phlegmon at this site. This abnormal soft tissue abuts the right inferior rectus muscle. The globes are normal in size and contour. The extraocular muscles and optic nerve sheath complexes are otherwise symmetric and unremarkable. Subtle right proptosis. Visualized sinuses: The right maxillary sinus is completely opacified with complex material. Osseous thinning with foci of possible dehiscence in the right orbital floor/roof of right maxillary sinus, better appreciated on the head CT performed earlier today. Near complete T2 hyperintense opacification of the anterior right ethmoid air cells. Trace mucosal thickening elsewhere within the ethmoid sinuses, and within the visualized left maxillary  sinus. Soft tissues: T2 hyperintense signal abnormality and enhancement within the right periorbital soft tissues. Limited intracranial: No evidence of acute intracranial abnormality within the field of view. These results were called by telephone at the time of interpretation on 03/19/2021 at 8:25 pm to provider Dr. Joni Fears, who verbally acknowledged these results. IMPRESSION: The right maxillary sinus is completely opacified with complex material. Associated osseous thinning and possible foci of dehiscence in the right orbital floor/roof of maxillary sinus, better appreciated on the head CT performed earlier today. There is adjacent abnormal enhancing soft tissue within the inferior extraconal right orbit, measuring up to 5 mm in thickness. This is favored to reflect extension of infection from the right maxillary sinus with resultant right post-septal orbital cellulitis/phlegmon. This abnormal enhancing soft tissue abuts the right inferior rectus muscle. Subtle right proptosis. Signal abnormality and enhancement within the right periorbital soft tissues, likely reflecting a component of periorbital cellulitis. Additional paranasal sinus disease, as described. Most notably, there is severe partial opacification of the anterior right ethmoid air cells (overall right ostiomeatal unit obstructive pattern). ENT and ophthalmology consultation recommended. Electronically Signed   By: Kellie Simmering D.O.   On: 03/19/2021 20:31    Labs on Admission: I have personally reviewed following labs  CBC: Recent Labs  Lab 03/19/21 1544  WBC 12.4*  NEUTROABS 9.0*  HGB 12.4*  HCT 34.1*  MCV 97.4  PLT 093   Basic Metabolic Panel: Recent Labs  Lab 03/19/21 1544  NA 123*  K 4.3  CL 88*  CO2 24  GLUCOSE 179*  BUN 21  CREATININE 0.88  CALCIUM 9.6   GFR: Estimated Creatinine Clearance: 80.6 mL/min (by C-G formula based on SCr of 0.88 mg/dL).  Liver Function Tests: Recent Labs  Lab 03/19/21 1544  AST 14*   ALT 7  ALKPHOS 66  BILITOT 1.4*  PROT 7.9  ALBUMIN 4.2   CBG: Recent Labs  Lab 03/19/21 1549 03/19/21 2235  GLUCAP 200* 161*   Urine analysis:    Component Value Date/Time   COLORURINE YELLOW (A) 04/22/2017 1639   APPEARANCEUR HAZY (A) 04/22/2017 1639   APPEARANCEUR Clear 03/10/2012 1014   LABSPEC 1.018 04/22/2017 1639   LABSPEC 1.011 03/10/2012 1014   PHURINE 5.0 04/22/2017 1639   GLUCOSEU NEGATIVE 04/22/2017 1639   GLUCOSEU Negative 03/10/2012 1014   HGBUR SMALL (A) 04/22/2017 1639   BILIRUBINUR NEGATIVE 04/22/2017 1639   BILIRUBINUR Negative 03/10/2012 1014   KETONESUR 5 (A) 04/22/2017 1639   PROTEINUR 100 (A) 04/22/2017 1639   NITRITE NEGATIVE 04/22/2017 1639   LEUKOCYTESUR NEGATIVE 04/22/2017 1639   LEUKOCYTESUR Negative 03/10/2012 1014   CRITICAL CARE Performed by:  Holger Sokolowski N Tylia Ewell  Total critical care time: 35 minutes  Critical care time was exclusive of separately billable procedures and treating other patients.  Critical care was necessary to treat or prevent imminent or life-threatening deterioration.  Acute invasive right maxillary sinusitis, invasive fungal rhinosinusitis  Critical care was time spent personally by me on the following activities: development of treatment plan with patient and/or surrogate as well as nursing, discussions with consultants, evaluation of patient's response to treatment, examination of patient, obtaining history from patient or surrogate, ordering and performing treatments and interventions, ordering and review of laboratory studies, ordering and review of radiographic studies, pulse oximetry and re-evaluation of patient's condition.  Dr. Tobie Poet [facility]  If 7PM-7AM, please contact overnight-coverage provider If 7AM-7PM, please contact day coverage provider [email or web address]  03/19/2021, 11:43 PM

## 2021-03-20 ENCOUNTER — Encounter: Payer: Self-pay | Admitting: Internal Medicine

## 2021-03-20 ENCOUNTER — Encounter
Admission: EM | Disposition: A | Discharge status: Home / Self Care | Payer: Self-pay | Source: Home / Self Care | Attending: Internal Medicine

## 2021-03-20 ENCOUNTER — Telehealth: Payer: Self-pay

## 2021-03-20 ENCOUNTER — Observation Stay: Payer: Medicare Other | Admitting: Anesthesiology

## 2021-03-20 DIAGNOSIS — H0520 Unspecified exophthalmos: Secondary | ICD-10-CM | POA: Diagnosis present

## 2021-03-20 DIAGNOSIS — Z7982 Long term (current) use of aspirin: Secondary | ICD-10-CM | POA: Diagnosis not present

## 2021-03-20 DIAGNOSIS — Z885 Allergy status to narcotic agent status: Secondary | ICD-10-CM | POA: Diagnosis not present

## 2021-03-20 DIAGNOSIS — F419 Anxiety disorder, unspecified: Secondary | ICD-10-CM | POA: Diagnosis present

## 2021-03-20 DIAGNOSIS — K122 Cellulitis and abscess of mouth: Secondary | ICD-10-CM | POA: Diagnosis not present

## 2021-03-20 DIAGNOSIS — J328 Other chronic sinusitis: Secondary | ICD-10-CM | POA: Diagnosis not present

## 2021-03-20 DIAGNOSIS — J3089 Other allergic rhinitis: Secondary | ICD-10-CM | POA: Diagnosis present

## 2021-03-20 DIAGNOSIS — L03213 Periorbital cellulitis: Secondary | ICD-10-CM

## 2021-03-20 DIAGNOSIS — Z6832 Body mass index (BMI) 32.0-32.9, adult: Secondary | ICD-10-CM | POA: Diagnosis not present

## 2021-03-20 DIAGNOSIS — E785 Hyperlipidemia, unspecified: Secondary | ICD-10-CM | POA: Diagnosis present

## 2021-03-20 DIAGNOSIS — Z87891 Personal history of nicotine dependence: Secondary | ICD-10-CM | POA: Diagnosis not present

## 2021-03-20 DIAGNOSIS — K760 Fatty (change of) liver, not elsewhere classified: Secondary | ICD-10-CM | POA: Diagnosis present

## 2021-03-20 DIAGNOSIS — J322 Chronic ethmoidal sinusitis: Secondary | ICD-10-CM | POA: Diagnosis not present

## 2021-03-20 DIAGNOSIS — G4733 Obstructive sleep apnea (adult) (pediatric): Secondary | ICD-10-CM | POA: Diagnosis present

## 2021-03-20 DIAGNOSIS — J320 Chronic maxillary sinusitis: Secondary | ICD-10-CM | POA: Diagnosis not present

## 2021-03-20 DIAGNOSIS — J0100 Acute maxillary sinusitis, unspecified: Principal | ICD-10-CM

## 2021-03-20 DIAGNOSIS — Z9989 Dependence on other enabling machines and devices: Secondary | ICD-10-CM | POA: Diagnosis not present

## 2021-03-20 DIAGNOSIS — E871 Hypo-osmolality and hyponatremia: Secondary | ICD-10-CM | POA: Diagnosis present

## 2021-03-20 DIAGNOSIS — I10 Essential (primary) hypertension: Secondary | ICD-10-CM | POA: Diagnosis present

## 2021-03-20 DIAGNOSIS — E1165 Type 2 diabetes mellitus with hyperglycemia: Secondary | ICD-10-CM | POA: Diagnosis present

## 2021-03-20 DIAGNOSIS — E1140 Type 2 diabetes mellitus with diabetic neuropathy, unspecified: Secondary | ICD-10-CM | POA: Diagnosis present

## 2021-03-20 DIAGNOSIS — I2510 Atherosclerotic heart disease of native coronary artery without angina pectoris: Secondary | ICD-10-CM | POA: Diagnosis present

## 2021-03-20 DIAGNOSIS — J018 Other acute sinusitis: Secondary | ICD-10-CM | POA: Diagnosis not present

## 2021-03-20 DIAGNOSIS — E6609 Other obesity due to excess calories: Secondary | ICD-10-CM | POA: Diagnosis present

## 2021-03-20 DIAGNOSIS — J324 Chronic pansinusitis: Secondary | ICD-10-CM | POA: Diagnosis present

## 2021-03-20 DIAGNOSIS — Z20822 Contact with and (suspected) exposure to covid-19: Secondary | ICD-10-CM | POA: Diagnosis present

## 2021-03-20 DIAGNOSIS — Z79899 Other long term (current) drug therapy: Secondary | ICD-10-CM | POA: Diagnosis not present

## 2021-03-20 DIAGNOSIS — R519 Headache, unspecified: Secondary | ICD-10-CM | POA: Diagnosis not present

## 2021-03-20 DIAGNOSIS — J329 Chronic sinusitis, unspecified: Secondary | ICD-10-CM | POA: Diagnosis present

## 2021-03-20 DIAGNOSIS — K5900 Constipation, unspecified: Secondary | ICD-10-CM | POA: Diagnosis present

## 2021-03-20 DIAGNOSIS — G43909 Migraine, unspecified, not intractable, without status migrainosus: Secondary | ICD-10-CM | POA: Diagnosis present

## 2021-03-20 DIAGNOSIS — H05012 Cellulitis of left orbit: Secondary | ICD-10-CM | POA: Diagnosis not present

## 2021-03-20 DIAGNOSIS — H05011 Cellulitis of right orbit: Secondary | ICD-10-CM | POA: Diagnosis not present

## 2021-03-20 DIAGNOSIS — J439 Emphysema, unspecified: Secondary | ICD-10-CM | POA: Diagnosis present

## 2021-03-20 HISTORY — PX: ETHMOIDECTOMY: SHX5197

## 2021-03-20 HISTORY — PX: MAXILLARY ANTROSTOMY: SHX2003

## 2021-03-20 HISTORY — PX: IMAGE GUIDED SINUS SURGERY: SHX6570

## 2021-03-20 LAB — GLUCOSE, CAPILLARY
Glucose-Capillary: 142 mg/dL — ABNORMAL HIGH (ref 70–99)
Glucose-Capillary: 152 mg/dL — ABNORMAL HIGH (ref 70–99)
Glucose-Capillary: 179 mg/dL — ABNORMAL HIGH (ref 70–99)
Glucose-Capillary: 203 mg/dL — ABNORMAL HIGH (ref 70–99)
Glucose-Capillary: 224 mg/dL — ABNORMAL HIGH (ref 70–99)

## 2021-03-20 LAB — BASIC METABOLIC PANEL
Anion gap: 7 (ref 5–15)
BUN: 16 mg/dL (ref 8–23)
CO2: 26 mmol/L (ref 22–32)
Calcium: 9.2 mg/dL (ref 8.9–10.3)
Chloride: 95 mmol/L — ABNORMAL LOW (ref 98–111)
Creatinine, Ser: 0.89 mg/dL (ref 0.61–1.24)
GFR, Estimated: >60 mL/min (ref >60)
Glucose, Bld: 150 mg/dL — ABNORMAL HIGH (ref 70–99)
Potassium: 4.3 mmol/L (ref 3.5–5.1)
Sodium: 128 mmol/L — ABNORMAL LOW (ref 135–145)

## 2021-03-20 LAB — CBC
HCT: 31.8 % — ABNORMAL LOW (ref 39.0–52.0)
Hemoglobin: 11.6 g/dL — ABNORMAL LOW (ref 13.0–17.0)
MCH: 36.1 pg — ABNORMAL HIGH (ref 26.0–34.0)
MCHC: 36.5 g/dL — ABNORMAL HIGH (ref 30.0–36.0)
MCV: 99.1 fL (ref 80.0–100.0)
Platelets: 244 10*3/uL (ref 150–400)
RBC: 3.21 MIL/uL — ABNORMAL LOW (ref 4.22–5.81)
RDW: 12.3 % (ref 11.5–15.5)
WBC: 9.2 10*3/uL (ref 4.0–10.5)
nRBC: 0 % (ref 0.0–0.2)

## 2021-03-20 LAB — HEMOGLOBIN A1C
Hgb A1c MFr Bld: 6.6 % — ABNORMAL HIGH (ref 4.8–5.6)
Mean Plasma Glucose: 143 mg/dL

## 2021-03-20 LAB — C-REACTIVE PROTEIN: CRP: 7.9 mg/dL — ABNORMAL HIGH (ref <1.0)

## 2021-03-20 LAB — LACTATE DEHYDROGENASE: LDH: 103 U/L (ref 98–192)

## 2021-03-20 LAB — FERRITIN: Ferritin: 234 ng/mL (ref 24–336)

## 2021-03-20 LAB — D-DIMER, QUANTITATIVE: D-Dimer, Quant: 1.08 ug/mL-FEU — ABNORMAL HIGH (ref 0.00–0.50)

## 2021-03-20 SURGERY — MAXILLARY ANTROSTOMY
Anesthesia: General | Laterality: Right

## 2021-03-20 SURGERY — SINUS SURGERY, ENDOSCOPIC, USING COMPUTER-ASSISTED NAVIGATION
Anesthesia: Choice

## 2021-03-20 MED ORDER — PHENYLEPHRINE HCL (PRESSORS) 10 MG/ML IV SOLN
INTRAVENOUS | Status: DC | PRN
  Administered 2021-03-20 (×2): 100 ug via INTRAVENOUS

## 2021-03-20 MED ORDER — POLYETHYLENE GLYCOL 3350 17 G PO PACK
17.0000 g | PACK | Freq: Every day | ORAL | Status: DC
  Administered 2021-03-20 – 2021-03-21 (×2): 17 g via ORAL
  Filled 2021-03-20 (×3): qty 1

## 2021-03-20 MED ORDER — PANTOPRAZOLE SODIUM 40 MG PO TBEC
40.0000 mg | DELAYED_RELEASE_TABLET | Freq: Every day | ORAL | Status: DC
  Administered 2021-03-20 – 2021-03-23 (×4): 40 mg via ORAL
  Filled 2021-03-20 (×4): qty 1

## 2021-03-20 MED ORDER — METRONIDAZOLE 500 MG/100ML IV SOLN
500.0000 mg | Freq: Two times a day (BID) | INTRAVENOUS | Status: DC
  Administered 2021-03-20: 500 mg via INTRAVENOUS
  Filled 2021-03-20 (×3): qty 100

## 2021-03-20 MED ORDER — OXYCODONE HCL 5 MG/5ML PO SOLN
5.0000 mg | Freq: Once | ORAL | Status: DC | PRN
Start: 2021-03-20 — End: 2021-03-20

## 2021-03-20 MED ORDER — 0.9 % SODIUM CHLORIDE (POUR BTL) OPTIME
TOPICAL | Status: DC | PRN
  Administered 2021-03-20: 500 mL

## 2021-03-20 MED ORDER — OXYMETAZOLINE HCL 0.05 % NA SOLN
NASAL | Status: DC | PRN
  Administered 2021-03-20: 1

## 2021-03-20 MED ORDER — FENTANYL CITRATE (PF) 100 MCG/2ML IJ SOLN
INTRAMUSCULAR | Status: DC | PRN
  Administered 2021-03-20 (×2): 50 ug via INTRAVENOUS

## 2021-03-20 MED ORDER — VANCOMYCIN HCL 2000 MG/400ML IV SOLN
2000.0000 mg | Freq: Once | INTRAVENOUS | Status: DC
  Filled 2021-03-20: qty 400

## 2021-03-20 MED ORDER — GLYCOPYRROLATE 0.2 MG/ML IJ SOLN
INTRAMUSCULAR | Status: DC | PRN
  Administered 2021-03-20: .2 mg via INTRAVENOUS

## 2021-03-20 MED ORDER — LIDOCAINE-EPINEPHRINE (PF) 1 %-1:200000 IJ SOLN
INTRAMUSCULAR | Status: DC | PRN
  Administered 2021-03-20: 11 mL

## 2021-03-20 MED ORDER — OXYCODONE HCL 5 MG PO TABS: 5.0000 mg | ORAL_TABLET | Freq: Once | ORAL | Status: DC | PRN

## 2021-03-20 MED ORDER — DOCUSATE SODIUM 100 MG PO CAPS: 100.0000 mg | ORAL_CAPSULE | Freq: Two times a day (BID) | ORAL | Status: DC | PRN

## 2021-03-20 MED ORDER — DEXAMETHASONE SODIUM PHOSPHATE 10 MG/ML IJ SOLN
INTRAMUSCULAR | Status: DC | PRN
  Administered 2021-03-20: 10 mg via INTRAVENOUS

## 2021-03-20 MED ORDER — EPHEDRINE SULFATE 50 MG/ML IJ SOLN
INTRAMUSCULAR | Status: DC | PRN
  Administered 2021-03-20: 10 mg via INTRAVENOUS

## 2021-03-20 MED ORDER — MEPERIDINE HCL 25 MG/ML IJ SOLN: 6.2500 mg | INTRAMUSCULAR | Status: DC | PRN

## 2021-03-20 MED ORDER — FLUCONAZOLE IN SODIUM CHLORIDE 200-0.9 MG/100ML-% IV SOLN
200.0000 mg | Freq: Every day | INTRAVENOUS | Status: DC
  Administered 2021-03-20: 200 mg via INTRAVENOUS
  Filled 2021-03-20: qty 100

## 2021-03-20 MED ORDER — ROCURONIUM BROMIDE 100 MG/10ML IV SOLN
INTRAVENOUS | Status: DC | PRN
  Administered 2021-03-20: 50 mg via INTRAVENOUS

## 2021-03-20 MED ORDER — ATORVASTATIN CALCIUM 20 MG PO TABS
80.0000 mg | ORAL_TABLET | Freq: Every day | ORAL | Status: DC
  Administered 2021-03-20 – 2021-03-23 (×4): 80 mg via ORAL
  Filled 2021-03-20 (×4): qty 4

## 2021-03-20 MED ORDER — LIDOCAINE HCL (CARDIAC) PF 100 MG/5ML IV SOSY
PREFILLED_SYRINGE | INTRAVENOUS | Status: DC | PRN
  Administered 2021-03-20: 100 mg via INTRAVENOUS

## 2021-03-20 MED ORDER — PROPOFOL 10 MG/ML IV BOLUS
INTRAVENOUS | Status: DC | PRN
  Administered 2021-03-20: 160 mg via INTRAVENOUS

## 2021-03-20 MED ORDER — ONDANSETRON HCL 4 MG/2ML IJ SOLN
INTRAMUSCULAR | Status: DC | PRN
  Administered 2021-03-20: 4 mg via INTRAVENOUS

## 2021-03-20 MED ORDER — SODIUM CHLORIDE 0.9 % IV SOLN
2.0000 g | Freq: Three times a day (TID) | INTRAVENOUS | Status: DC
  Administered 2021-03-20 – 2021-03-22 (×7): 2 g via INTRAVENOUS
  Filled 2021-03-20 (×10): qty 2

## 2021-03-20 MED ORDER — SUGAMMADEX SODIUM 200 MG/2ML IV SOLN
INTRAVENOUS | Status: DC | PRN
  Administered 2021-03-20: 200 mg via INTRAVENOUS

## 2021-03-20 MED ORDER — LIDOCAINE-EPINEPHRINE (PF) 1 %-1:200000 IJ SOLN
INTRAMUSCULAR | Status: AC
  Filled 2021-03-20: qty 10

## 2021-03-20 MED ORDER — PROPOFOL 10 MG/ML IV BOLUS
INTRAVENOUS | Status: AC
  Filled 2021-03-20: qty 20

## 2021-03-20 MED ORDER — DEXMEDETOMIDINE (PRECEDEX) IN NS 20 MCG/5ML (4 MCG/ML) IV SYRINGE
PREFILLED_SYRINGE | INTRAVENOUS | Status: DC | PRN
  Administered 2021-03-20: 8 ug via INTRAVENOUS

## 2021-03-20 MED ORDER — FENTANYL CITRATE (PF) 100 MCG/2ML IJ SOLN
INTRAMUSCULAR | Status: AC
  Filled 2021-03-20: qty 2

## 2021-03-20 MED ORDER — OXYMETAZOLINE HCL 0.05 % NA SOLN
NASAL | Status: AC
  Filled 2021-03-20: qty 30

## 2021-03-20 MED ORDER — PHENYLEPHRINE HCL (PRESSORS) 10 MG/ML IV SOLN
INTRAVENOUS | Status: AC
  Filled 2021-03-20: qty 1

## 2021-03-20 MED ORDER — FENTANYL CITRATE (PF) 100 MCG/2ML IJ SOLN: 25.0000 ug | INTRAMUSCULAR | Status: DC | PRN

## 2021-03-20 MED ORDER — SALINE SPRAY 0.65 % NA SOLN
4.0000 | Freq: Four times a day (QID) | NASAL | Status: DC
  Administered 2021-03-20 – 2021-03-23 (×11): 4 via NASAL
  Filled 2021-03-20: qty 44

## 2021-03-20 MED ORDER — SODIUM CHLORIDE 0.9 % IV SOLN
INTRAVENOUS | Status: DC | PRN
  Administered 2021-03-20 – 2021-03-21 (×2): 250 mL via INTRAVENOUS

## 2021-03-20 MED ORDER — ERYTHROMYCIN 5 MG/GM OP OINT
1.0000 "application " | TOPICAL_OINTMENT | Freq: Three times a day (TID) | OPHTHALMIC | Status: AC
  Administered 2021-03-20 – 2021-03-23 (×9): 1 via OPHTHALMIC
  Filled 2021-03-20 (×2): qty 1

## 2021-03-20 MED ORDER — PROMETHAZINE HCL 25 MG/ML IJ SOLN: 6.2500 mg | INTRAMUSCULAR | Status: DC | PRN

## 2021-03-20 SURGICAL SUPPLY — 30 items
BATTERY INSTRU NAVIGATION (MISCELLANEOUS) ×9 IMPLANT
CNTNR SPEC 2.5X3XGRAD LEK (MISCELLANEOUS) ×2
CONT SPEC 4OZ STER OR WHT (MISCELLANEOUS) ×1
CONTAINER SPEC 2.5X3XGRAD LEK (MISCELLANEOUS) IMPLANT
CUP MEDICINE 2OZ PLAST GRAD ST (MISCELLANEOUS) ×3 IMPLANT
DEFOGGER ANTIFOG KIT (MISCELLANEOUS) ×4 IMPLANT
DRESSING NASL FOAM PST OP SINU (MISCELLANEOUS) ×2 IMPLANT
DRSG NASAL FOAM POST OP SINU (MISCELLANEOUS)
GAUZE 4X4 16PLY ~~LOC~~+RFID DBL (SPONGE) ×3 IMPLANT
GLOVE SURG ENC MOIS LTX SZ7.5 (GLOVE) ×3 IMPLANT
GOWN STRL REUS W/ TWL LRG LVL3 (GOWN DISPOSABLE) ×4 IMPLANT
GOWN STRL REUS W/TWL LRG LVL3 (GOWN DISPOSABLE) ×2
IV NS 250ML (IV SOLUTION) ×1
IV NS 250ML BAXH (IV SOLUTION) ×2 IMPLANT
KIT TURNOVER KIT A (KITS) ×3 IMPLANT
MANIFOLD NEPTUNE II (INSTRUMENTS) ×3 IMPLANT
NDL SPNL 25GX3.5 QUINCKE BL (NEEDLE) ×2 IMPLANT
NEEDLE SPNL 25GX3.5 QUINCKE BL (NEEDLE) ×3 IMPLANT
NS IRRIG 500ML POUR BTL (IV SOLUTION) ×3 IMPLANT
PACK HEAD/NECK (MISCELLANEOUS) ×3 IMPLANT
PACKING NASAL EPIS 4X2.4 XEROG (MISCELLANEOUS) IMPLANT
SHAVER DIEGO BLD STD TYPE A (BLADE) ×3 IMPLANT
SPONGE NEURO XRAY DETECT 1X3 (DISPOSABLE) ×3 IMPLANT
SUT ETHILON 3-0 FS-10 30 BLK (SUTURE) ×3
SUTURE EHLN 3-0 FS-10 30 BLK (SUTURE) ×2 IMPLANT
SWAB CULTURE AMIES ANAERIB BLU (MISCELLANEOUS) ×3 IMPLANT
TRACKER CRANIALMASK (MASK) ×3 IMPLANT
TUBING DECLOG MULTIDEBRIDER (TUBING) ×3 IMPLANT
WATER STERILE IRR 1000ML POUR (IV SOLUTION) ×3 IMPLANT
WATER STERILE IRR 500ML POUR (IV SOLUTION) ×3 IMPLANT

## 2021-03-20 NOTE — Op Note (Signed)
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  Frozen sections from mucosa of the maxillary sinus did not show invasive fungal sinusitis.  There was thinning of the bone of the superolateral maxillary sinus, possibly with dehiscence though no orbital fat was encountered.  Procedure: After the patient was identified in holding and the benefits of the procedure were reviewed as well as the consent and risks, the patient was taken to the operating room and with the patient in a comfortable supine position,  general orotracheal anesthesia was induced without difficulty.  A proper time-out was performed.  The [employer] image guidance system was set up and calibrated in the normal fashion and felt to be acceptable.  Next 1% Xylocaine with 1:100,000 epinephrine was infiltrated into the right inferior turbinate, and anterior middle turbinate.  Several minutes were allowed for this to take effect.  Cottoniod pledgets soaked in Afrin were placed into both nasal cavities and left while the patient was prepped and draped in the standard fashion. The image guided suction was calibrated and used to inspect known points in the nasal cavity to assess accuracy of the image guided system. Accuracy was felt to be good, though we could not get the bone windows images to work with the system.   The right middle turbinate was medialized and the uncinate process then resected with through-cutting forceps as well as  the microdebrider.  The tissue in this area was markedly inflamed and immediately the maxillary sinus was easily entered, using a curved suction to bluntly dissect the sinus open.  Curved right angle forceps were used to debride tissue within the maxillary sinus.  The sinus was cultured.  The sinus was then repeatedly irrigated and fungal like debris was encountered and removed from the sinus and sent as specimen.  Markedly inflamed mucosa along the superior aspect of the sinus was debrided and sent as a frozen section.  The ethmoid sinuses were entered inferomedially and opened.  The microdebrider was used to debride some of the mucosal tissue taking care to avoid dissecting laterally towards the orbit.  Once this was completed the right maxillary sinus was again evaluated.  More of the mucosa was sent for permanent sections, taking down the mucosa of the superior aspect of the sinus.  There is definite thinning of the bone in that area.  I cannot rule out some dehiscence and exposure of the periosteum, but orbital fat was not encountered and grossly there was no necrotic tissue seen under the mucosa once it was debrided away.  Next the left anterior ethmoid sinuses were dissected beginning inferomedially, entering the ethmoid bulla. Thru cut forceps were used to open the anterior ethmoids. The microdebrider was used as needed to trim loose mucosal edges.  No packing was placed as bleeding was adequately controlled.  The patient was then returned to the anesthesiologist for awakening and taken to recovery room in good condition postoperatively.  Disposition:   PACU and admission to the hospitalist  service for medical management.  Plan: Ice, elevation, narcotic analgesia and continue antibiotics.  He should hold on his CPAP for now because of the potential for orbital emphysema.  [name] 03/20/2021 9:15 AM

## 2021-03-20 NOTE — ED Notes (Signed)
When I came in at 23:00, Daniel Landry, MD (ENT) was speaking with ENT @ [address]  ( [place]) [place]. Amada Jupiter said she accepted pt., but then I called [facility] transfer [employer]), she proceeds to tell me that does not mean that pt is accepted because pt wasn't accepted through the transfer center. [facility] denied pt transfer.  Per [facility], try [facility] and [facility]. [facility] at this point has taken over. [facility] spoke with [facility] and pt was denied transfer to their facility. Called [facility] (Sarah)for hospitalist to hospitalist transfer. [facility] would like for their ENT to be [facility] is at home on call and pt will not receive a bed tonight so they chose to wait and page ENT in am. Pt admitted to rm 202.  Spoke w/ Tricia CN on 2C to explain situation. 2 C to call [facility] back this am for transfer, hospitalist to hospitalist w/ [place] [facility] involved.

## 2021-03-20 NOTE — H&P (Signed)
I just learned this morning that the patient was denied transfer due to lack of a hospital bed at [facility] overnight.  Given this, I have emergently added the patient on for right maxillary antrostomy, endoscopic ethmoidectomy, and sinus debridement.  We will be able to better assess the sinus and see if there is evidence of invasive fungal sinusitis.  Biopsies will be taken of any necrotic appearing tissue for diagnostic purposes.  Patient does feel little better so hopefully this is just bacterial sinusitis with orbital cellulitis or at worst allergic fungal sinusitis with superimposed bacterial sinusitis.  I have explained to the patient that if there is invasive fungal sinusitis, an attempt will be made to debride all necrotic appearing tissue which could involve the orbit itself including the orbital muscle.  Certainly if that is the case we will continue to make efforts to transfer the patient to [facility] as I do feel that invasive fungal sinusitis would be best cared for at a tertiary care center.  He expressed understanding.  All questions regarding the procedure answered, and patient (or family if a child) expressed understanding of the procedure.  [name] @TODAY @

## 2021-03-20 NOTE — Progress Notes (Addendum)
I received checkout from Dr. Tobie Poet that this patient will need to be transferred to a tertiary facility.  Contacted [facility] was initially done on and we thought we had an accepting doctor however later on ENT did not accept the patient.  Contact was [facility] with made and we are still waiting for the hospitalist to call back.  It is obvious that the patient will not be transferred to [facility] either.  We contacted [facility] and Dr. Cyd Silence wanted to get Dr. Redmond Baseman on the line.  I discussed the case with Dr. Redmond Baseman with ENT and he clearly indicated that they will not offer more service than can be offered at [facility].  I sent a message to Dr. Richardson Landry that the patient was not accepted at [facility] and [facility] we are waiting for the hospitalist was paged and that I talked to Dr. Redmond Baseman from [facility] who did not accept the patient so I wanted to check with Dr. Richardson Landry to see if he can do a nasal endoscopy and biopsy in the morning until the patient is transferred and I added IV Diflucan to cover fungal infection.  He can further follow-up with the morning hospitalist for further care plan.

## 2021-03-20 NOTE — Progress Notes (Signed)
Pt to surgery per Dr Richardson Landry, wife present, pt agrees.

## 2021-03-20 NOTE — Progress Notes (Signed)
Daniel Holden, Daniel Holden [id number] 22-Apr-1946 [name], MD   SUBJECTIVE: This 75 y.o. year old male is status post right maxillary antrostomy and ethmoidectomy with sinus debridement.  He is doing well this afternoon with some return of feeling to his right cheek, and significant improvement in his eye pain.  Also notes that his teeth are not sore as they were previously.  He is getting some old clot out of the nose but no heavy bright red bleeding.  Medications:  Current Facility-Administered Medications  Medication Dose Route Frequency Provider Last Rate Last Admin   0.9 %  sodium chloride infusion   Intravenous PRN Cox, Amy N, DO 0 mL/hr at 03/20/21 0417 New Bag at 03/20/21 6599   acetaminophen (TYLENOL) tablet 650 mg  650 mg Oral Q6H PRN Cox, Amy N, DO   650 mg at 03/19/21 2321   Or   acetaminophen (TYLENOL) suppository 650 mg  650 mg Rectal Q6H PRN Cox, Amy N, DO       ALPRAZolam Duanne Moron) tablet 0.25 mg  0.25 mg Oral QHS PRN Cox, Amy N, DO   0.25 mg at 03/19/21 2245   amLODipine (NORVASC) tablet 5 mg  5 mg Oral Daily Cox, Amy N, DO   5 mg at 03/20/21 1137   atorvastatin (LIPITOR) tablet 80 mg  80 mg Oral Daily Dessa Phi, DO       benazepril (LOTENSIN) tablet 20 mg  20 mg Oral Daily Cox, Amy N, DO   20 mg at 03/20/21 1243   ceFEPIme (MAXIPIME) 2 g in sodium chloride 0.9 % 100 mL IVPB  2 g Intravenous Q8H Renda Rolls, RPH 200 mL/hr at 03/20/21 1423 2 g at 03/20/21 1423   cholecalciferol (VITAMIN D) tablet 5,000 Units  5,000 Units Oral Daily Cox, Amy N, DO   5,000 Units at 03/20/21 1136   erythromycin ophthalmic ointment 1 application  1 application Right Eye J5T Dessa Phi, DO       fluticasone (FLONASE) 50 MCG/ACT nasal spray 2 spray  2 spray Each Nare Daily Cox, Amy N, DO       gabapentin (NEURONTIN) capsule 300 mg  300 mg Oral BID Cox, Amy N, DO   300 mg at 03/20/21 1138   insulin aspart (novoLOG) injection 0-5 Units  0-5 Units Subcutaneous QHS Cox, Amy N, DO       insulin  aspart (novoLOG) injection 0-9 Units  0-9 Units Subcutaneous TID WC Cox, Amy N, DO   2 Units at 03/20/21 1139   melatonin tablet 10 mg  10 mg Oral QHS Cox, Amy N, DO       metoprolol tartrate (LOPRESSOR) tablet 50 mg  50 mg Oral BID Cox, Amy N, DO   50 mg at 03/20/21 1138   ondansetron (ZOFRAN) tablet 4 mg  4 mg Oral Q6H PRN Cox, Amy N, DO       Or   ondansetron (ZOFRAN) injection 4 mg  4 mg Intravenous Q6H PRN Cox, Amy N, DO       pantoprazole (PROTONIX) EC tablet 40 mg  40 mg Oral Q breakfast Renda Rolls, RPH   40 mg at 03/20/21 1138   ranolazine (RANEXA) 12 hr tablet 1,000 mg  1,000 mg Oral BID Cox, Amy N, DO   1,000 mg at 03/20/21 1244   sodium chloride (OCEAN) 0.65 % nasal spray 4 spray  4 spray Nasal QID Clyde Canterbury, MD       vitamin B-12 (CYANOCOBALAMIN) tablet 1,000 mcg  1,000 mcg Oral Daily Cox, Amy N, DO   1,000 mcg at 03/20/21 1138  .  Medications Prior to Admission  Medication Sig Dispense Refill   ALPRAZolam (XANAX) 0.25 MG tablet Take 1 tablet (0.25 mg total) by mouth at bedtime as needed for sleep. 30 tablet 0   amLODipine (NORVASC) 5 MG tablet Take 1 tablet (5 mg total) by mouth daily. 30 tablet 11   aspirin 81 MG EC tablet Take 81 mg by mouth daily.     atorvastatin (LIPITOR) 80 MG tablet Take 1 tablet (80 mg total) by mouth daily. 90 tablet 3   benazepril (LOTENSIN) 20 MG tablet Take 1 tablet (20 mg total) by mouth daily. 90 tablet 3   Cholecalciferol 250 MCG (10000 UT) TABS Take 1,000 Units by mouth daily.     cyanocobalamin 1000 MCG tablet Take 1,000 mcg by mouth daily.     fluticasone (FLONASE) 50 MCG/ACT nasal spray Place 2 sprays into both nostrils daily.      gabapentin (NEURONTIN) 300 MG capsule Take 300 mg by mouth 2 (two) times daily.     melatonin 3 MG TABS tablet Take 10 mg by mouth at bedtime.     metoprolol tartrate (LOPRESSOR) 50 MG tablet Take 1 tablet (50 mg total) by mouth 2 (two) times daily. 180 tablet 3   naproxen sodium (ALEVE) 220 MG tablet Take  220 mg by mouth 2 (two) times daily.     omeprazole (PRILOSEC OTC) 20 MG tablet Take 10 mg by mouth daily.     ranolazine (RANEXA) 1000 MG SR tablet Take 1 tablet (1,000 mg total) by mouth 2 (two) times daily. 180 tablet 3    OBJECTIVE:  PHYSICAL EXAM  Vitals: Blood pressure 128/72, pulse 87, temperature 97.6 F (36.4 C), resp. rate 18, height 5\' 6"  (1.676 m), weight 89.8 kg, SpO2 93 %.. General: Well-developed, Well-nourished in no acute distress Mood: Mood and affect well adjusted, pleasant and cooperative. Orientation: Grossly alert and oriented. Vocal Quality: No hoarseness. Communicates verbally. head and Face: NCAT. No facial asymmetry. No visible skin lesions. No significant facial scars. No tenderness with sinus percussion. Facial strength normal and symmetric. Nose: External nose normal with midline dorsum and no lesions or deformity.  Some old clot expressed from the nose but no active bright red bleeding.   Eyes: Gaze and Ocular Motility almost normal with improvement in upward gaze from preop.  Still some mild orbital edema on the right.  Try to stay away from here if we can  [place]: Data Review:  Results for orders placed or performed during the hospital encounter of 03/19/21 (from the past 48 hour(s))  CBC     Status: Abnormal   Collection Time: 03/19/21  3:44 PM  Result Value Ref Range   WBC 12.4 (H) 4.0 - 10.5 K/uL   RBC 3.50 (L) 4.22 - 5.81 MIL/uL   Hemoglobin 12.4 (L) 13.0 - 17.0 g/dL   HCT 34.1 (L) 39.0 - 52.0 %   MCV 97.4 80.0 - 100.0 fL   MCH 35.4 (H) 26.0 - 34.0 pg   MCHC 36.4 (H) 30.0 - 36.0 g/dL   RDW 12.3 11.5 - 15.5 %   Platelets 292 150 - 400 K/uL   nRBC 0.0 0.0 - 0.2 %    Comment: Performed at [facility], [address], [place], [place] [address]  Differential     Status: Abnormal   Collection Time: 03/19/21  3:44 PM  Result Value Ref  Range   Neutrophils Relative % 73 %   Neutro Abs 9.0 (H) 1.7 - 7.7 K/uL   Lymphocytes  Relative 15 %   Lymphs Abs 1.9 0.7 - 4.0 K/uL   Monocytes Relative 10 %   Monocytes Absolute 1.3 (H) 0.1 - 1.0 K/uL   Eosinophils Relative 1 %   Eosinophils Absolute 0.1 0.0 - 0.5 K/uL   Basophils Relative 0 %   Basophils Absolute 0.1 0.0 - 0.1 K/uL   Immature Granulocytes 1 %   Abs Immature Granulocytes 0.07 0.00 - 0.07 K/uL    Comment: Performed at [facility], [address] [place], [place] [address]  Comprehensive metabolic panel     Status: Abnormal   Collection Time: 03/19/21  3:44 PM  Result Value Ref Range   Sodium 123 (L) 135 - 145 mmol/L   Potassium 4.3 3.5 - 5.1 mmol/L   Chloride 88 (L) 98 - 111 mmol/L   CO2 24 22 - 32 mmol/L   Glucose, Bld 179 (H) 70 - 99 mg/dL    Comment: Glucose reference range applies only to samples taken after fasting for at least 8 hours.   BUN 21 8 - 23 mg/dL   Creatinine, Ser 0.88 0.61 - 1.24 mg/dL   Calcium 9.6 8.9 - 10.3 mg/dL   Total Protein 7.9 6.5 - 8.1 g/dL   Albumin 4.2 3.5 - 5.0 g/dL   AST 14 (L) 15 - 41 U/L   ALT 7 0 - 44 U/L   Alkaline Phosphatase 66 38 - 126 U/L   Total Bilirubin 1.4 (H) 0.3 - 1.2 mg/dL   GFR, Estimated >60 >60 mL/min    Comment: (NOTE) Calculated using the CKD-EPI Creatinine Equation (2021)    Anion gap 11 5 - 15    Comment: Performed at [facility], [address] [place], [place] [address]  Sedimentation rate     Status: Abnormal   Collection Time: 03/19/21  3:44 PM  Result Value Ref Range   Sed Rate 67 (H) 0 - 20 mm/hr    Comment: Performed at [facility], [address] [place], [place] [address]  Procalcitonin - Baseline     Status: None   Collection Time: 03/19/21  3:44 PM  Result Value Ref Range   Procalcitonin <0.10 ng/mL    Comment:        Interpretation: PCT (Procalcitonin) <= 0.5 ng/mL: Systemic infection (sepsis) is not likely. Local bacterial infection is possible. (NOTE)       Sepsis PCT Algorithm           Lower Respiratory Tract                                       Infection PCT Algorithm    ----------------------------     ----------------------------         PCT < 0.25 ng/mL                PCT < 0.10 ng/mL          Strongly encourage             Strongly discourage   discontinuation of antibiotics    initiation of antibiotics    ----------------------------     -----------------------------       PCT 0.25 - 0.50 ng/mL            PCT 0.10 - 0.25 ng/mL  OR       >80% decrease in PCT            Discourage initiation of                                            antibiotics      Encourage discontinuation           of antibiotics    ----------------------------     -----------------------------         PCT >= 0.50 ng/mL              PCT 0.26 - 0.50 ng/mL               AND        <80% decrease in PCT             Encourage initiation of                                             antibiotics       Encourage continuation           of antibiotics    ----------------------------     -----------------------------        PCT >= 0.50 ng/mL                  PCT > 0.50 ng/mL               AND         increase in PCT                  Strongly encourage                                      initiation of antibiotics    Strongly encourage escalation           of antibiotics                                     -----------------------------                                           PCT <= 0.25 ng/mL                                                 OR                                        > 80% decrease in PCT                                      Discontinue / Do not initiate  antibiotics  Performed at [facility], [address] [place], [place] [address]   CBG monitoring, ED     Status: Abnormal   Collection Time: 03/19/21  3:49 PM  Result Value Ref Range   Glucose-Capillary 200 (H) 70 - 99 mg/dL    Comment: Glucose reference range applies only to samples taken after  fasting for at least 8 hours.  MRSA Next Gen by PCR, Nasal     Status: None   Collection Time: 03/19/21  9:54 PM   Specimen: Nasopharyngeal Swab; Nasal Swab  Result Value Ref Range   MRSA by PCR Next Gen NOT DETECTED NOT DETECTED    Comment: (NOTE) The GeneXpert MRSA Assay (FDA approved for NASAL specimens only), is one component of a comprehensive MRSA colonization surveillance program. It is not intended to diagnose MRSA infection nor to guide or monitor treatment for MRSA infections. Test performance is not FDA approved in patients less than 66 years old. Performed at [facility], [address] [place], [place] [address]   Resp Panel by RT-PCR (Flu A&B, Covid) Nasopharyngeal Swab     Status: None   Collection Time: 03/19/21 10:18 PM   Specimen: Nasopharyngeal Swab; Nasopharyngeal(NP) swabs in vial transport medium  Result Value Ref Range   SARS Coronavirus 2 by RT PCR NEGATIVE NEGATIVE    Comment: (NOTE) SARS-CoV-2 target nucleic acids are NOT DETECTED.  The SARS-CoV-2 RNA is generally detectable in upper respiratory specimens during the acute phase of infection. The lowest concentration of SARS-CoV-2 viral copies this assay can detect is 138 copies/mL. A negative result does not preclude SARS-Cov-2 infection and should not be used as the sole basis for treatment or other patient management decisions. A negative result may occur with  improper specimen collection/handling, submission of specimen other than nasopharyngeal swab, presence of viral mutation(s) within the areas targeted by this assay, and inadequate number of viral copies(<138 copies/mL). A negative result must be combined with clinical observations, patient history, and epidemiological information. The expected result is Negative.  Fact Sheet for Patients:  EntrepreneurPulse.com.au  Fact Sheet for Healthcare Providers:  IncredibleEmployment.be  This test is no t  yet approved or cleared by the [place] FDA and  has been authorized for detection and/or diagnosis of SARS-CoV-2 by FDA under an Emergency Use Authorization (EUA). This EUA will remain  in effect (meaning this test can be used) for the duration of the COVID-19 declaration under Section 564(b)(1) of the Act, 21 U.S.C.section 360bbb-3(b)(1), unless the authorization is terminated  or revoked sooner.       Influenza A by PCR NEGATIVE NEGATIVE   Influenza B by PCR NEGATIVE NEGATIVE    Comment: (NOTE) The Xpert Xpress SARS-CoV-2/FLU/RSV plus assay is intended as an aid in the diagnosis of influenza from Nasopharyngeal swab specimens and should not be used as a sole basis for treatment. Nasal washings and aspirates are unacceptable for Xpert Xpress SARS-CoV-2/FLU/RSV testing.  Fact Sheet for Patients: EntrepreneurPulse.com.au  Fact Sheet for Healthcare Providers: IncredibleEmployment.be  This test is not yet approved or cleared by the [place] FDA and has been authorized for detection and/or diagnosis of SARS-CoV-2 by FDA under an Emergency Use Authorization (EUA). This EUA will remain in effect (meaning this test can be used) for the duration of the COVID-19 declaration under Section 564(b)(1) of the Act, 21 U.S.C. section 360bbb-3(b)(1), unless the authorization is terminated or revoked.  Performed at [facility], [address], [place], [place] [address]  CBG monitoring, ED     Status: Abnormal   Collection Time: 03/19/21 10:35 PM  Result Value Ref Range   Glucose-Capillary 161 (H) 70 - 99 mg/dL    Comment: Glucose reference range applies only to samples taken after fasting for at least 8 hours.  CULTURE, BLOOD (ROUTINE X 2) w Reflex to ID Panel     Status: None (Preliminary result)   Collection Time: 03/20/21 12:58 AM   Specimen: BLOOD  Result Value Ref Range   Specimen Description BLOOD LEFT ARM    Special  Requests      BOTTLES DRAWN AEROBIC AND ANAEROBIC Blood Culture adequate volume   Culture      NO GROWTH < 12 HOURS Performed at [facility], [address] [place], [place] [address]    Report Status PENDING   CULTURE, BLOOD (ROUTINE X 2) w Reflex to ID Panel     Status: None (Preliminary result)   Collection Time: 03/20/21 12:58 AM   Specimen: BLOOD  Result Value Ref Range   Specimen Description BLOOD LEFT HAND    Special Requests      BOTTLES DRAWN AEROBIC AND ANAEROBIC Blood Culture adequate volume   Culture      NO GROWTH < 12 HOURS Performed at [facility], [address], [place], [place] [address]    Report Status PENDING   Basic metabolic panel     Status: Abnormal   Collection Time: 03/20/21  4:03 AM  Result Value Ref Range   Sodium 128 (L) 135 - 145 mmol/L   Potassium 4.3 3.5 - 5.1 mmol/L   Chloride 95 (L) 98 - 111 mmol/L   CO2 26 22 - 32 mmol/L   Glucose, Bld 150 (H) 70 - 99 mg/dL    Comment: Glucose reference range applies only to samples taken after fasting for at least 8 hours.   BUN 16 8 - 23 mg/dL   Creatinine, Ser 0.89 0.61 - 1.24 mg/dL   Calcium 9.2 8.9 - 10.3 mg/dL   GFR, Estimated >60 >60 mL/min    Comment: (NOTE) Calculated using the CKD-EPI Creatinine Equation (2021)    Anion gap 7 5 - 15    Comment: Performed at [facility], [address] [place], [place] [address]  CBC     Status: Abnormal   Collection Time: 03/20/21  4:03 AM  Result Value Ref Range   WBC 9.2 4.0 - 10.5 K/uL   RBC 3.21 (L) 4.22 - 5.81 MIL/uL   Hemoglobin 11.6 (L) 13.0 - 17.0 g/dL   HCT 31.8 (L) 39.0 - 52.0 %   MCV 99.1 80.0 - 100.0 fL   MCH 36.1 (H) 26.0 - 34.0 pg   MCHC 36.5 (H) 30.0 - 36.0 g/dL   RDW 12.3 11.5 - 15.5 %   Platelets 244 150 - 400 K/uL   nRBC 0.0 0.0 - 0.2 %    Comment: Performed at [facility], [address] [place], [place] [address]  Ferritin     Status: None   Collection Time: 03/20/21  4:03 AM  Result  Value Ref Range   Ferritin 234 24 - 336 ng/mL    Comment: Performed at [facility], [address] [place], [place] [address]  Lactate dehydrogenase     Status: None   Collection Time: 03/20/21  4:03 AM  Result Value Ref Range   LDH 103 98 - 192 U/L    Comment: Performed at [facility], [address] [place], [place] [address]  D-dimer, quantitative     Status: Abnormal   Collection Time: 03/20/21  4:03 AM  Result Value Ref Range   D-Dimer, Quant 1.08 (H) 0.00 - 0.50 ug/mL-FEU    Comment: (NOTE) At the manufacturer cut-off value of 0.5 g/mL FEU, this assay has a negative predictive value of 95-100%.This assay is intended for use in conjunction with a clinical pretest probability (PTP) assessment model to exclude pulmonary embolism (PE) and deep venous thrombosis (DVT) in outpatients suspected of PE or DVT. Results should be correlated with clinical presentation. Performed at [facility], [address] [place], [place] [address]   C-reactive protein     Status: Abnormal   Collection Time: 03/20/21  4:03 AM  Result Value Ref Range   CRP 7.9 (H) <1.0 mg/dL    Comment: Performed at [place] [address], [place], [place] [address]  Glucose, capillary     Status: Abnormal   Collection Time: 03/20/21  7:20 AM  Result Value Ref Range   Glucose-Capillary 142 (H) 70 - 99 mg/dL    Comment: Glucose reference range applies only to samples taken after fasting for at least 8 hours.  Glucose, capillary     Status: Abnormal   Collection Time: 03/20/21  9:27 AM  Result Value Ref Range   Glucose-Capillary 152 (H) 70 - 99 mg/dL    Comment: Glucose reference range applies only to samples taken after fasting for at least 8 hours.  Glucose, capillary     Status: Abnormal   Collection Time: 03/20/21 11:31 AM  Result Value Ref Range   Glucose-Capillary 179 (H) 70 - 99 mg/dL    Comment: Glucose reference range applies only to samples taken after  fasting for at least 8 hours.  Glucose, capillary     Status: Abnormal   Collection Time: 03/20/21  4:39 PM  Result Value Ref Range   Glucose-Capillary 203 (H) 70 - 99 mg/dL    Comment: Glucose reference range applies only to samples taken after fasting for at least 8 hours.  . CT HEAD WO CONTRAST  Result Date: 03/19/2021 CLINICAL DATA:  headache EXAM: CT HEAD WITHOUT CONTRAST TECHNIQUE: Contiguous axial images were obtained from the base of the skull through the vertex without intravenous contrast. COMPARISON:  None. FINDINGS: Brain: No evidence of acute infarction, hemorrhage, hydrocephalus, extra-axial collection or mass lesion/mass effect. Vascular: No hyperdense vessel or unexpected calcification. Skull: Normal. Negative for fracture or focal lesion. Sinuses/Orbits: There is complete opacification of the RIGHT maxillary sinus in the majority of the RIGHT-sided ethmoid air cells extending into the hypoplastic RIGHT frontal sinus. There is fat stranding noted along the inferior orbital rim along the maxillary sinus extending into the inferior orbit (series 4, image 16). No definitive intracranial extension is seen, but evaluation is limited. Mild mucosal thickening of the LEFT maxillary sinus. Other: None IMPRESSION: 1. There is complete opacification of the RIGHT maxillary sinus extending into the RIGHT ethmoid air cells and the hypoplastic RIGHT frontal sinus. There is asymmetric fat stranding noted in the inferior RIGHT orbit. This could reflect a periosteal abscess. As such, recommend further dedicated orbit evaluation with CT or MRI with contrast. Electronically Signed   By: Valentino Saxon M.D.   On: 03/19/2021 18:25   CT MAXILLOFACIAL WO CONTRAST  Result Date: 03/19/2021 CLINICAL DATA:  Initial evaluation for concern of invasive right maxillary sinusitis with right orbital involvement. EXAM: CT MAXILLOFACIAL WITHOUT CONTRAST TECHNIQUE: Multidetector CT imaging  of the maxillofacial structures  was performed. Multiplanar CT image reconstructions were also generated. COMPARISON:  Comparison made with previous head CT and orbital MRI from 03/19/2021. FINDINGS: Orbits/Sinuses: Right maxillary sinus is completely opacified with complex heterogeneous material. The right ethmoidal air cells are also largely opacified. Opacification of the hypoplastic right frontal sinus and frontoethmoidal recess. Mucosal thickening within the right sphenoid sinus. Thin section bone window algorithm was performed through the sinuses. Images demonstrate multifocal areas of osseous thinning and dehiscence about the right orbital floor (series 6, image 54), confirming findings seen on prior studies. Additionally, the anterior and posterior walls of the right maxillary sinus are also thin with scattered areas of dehiscence (series 3, image 58). Hazy inflammatory stranding seen within the right retro antral fat, consistent with invasive sinusitis (series 2, image 53). The right pre maxillary soft tissues are relatively preserved at this time. Stranding with inflammatory changes seen involving the extraconal fat of the inferior right orbit, again concerning for postseptal cellulitis related to invasive sinusitis (series 5, image 30). Mild mass effect on the adjacent right inferior rectus muscle which is slightly bowed superiorly (series 5, image 31). Additionally, there is possible thinning and dehiscence at the inferior right lamina papyracea (series 6, image 43). The right lamina papyracea is slightly bowed laterally into the bony right orbit due to underlying sinus disease (series 2, image 92). No discrete abscess or drainable fluid collection seen on this noncontrast CT. Remainder of the right orbital fat is relatively maintained at this time. Mild right-sided proptosis again noted. Right globe and optic nerve otherwise unremarkable. Extra-ocular muscles otherwise unremarkable. Lacrimal gland normal. No other abnormality about the  right lacrimal apparatus. Contralateral left globe and orbit within normal limits. Other than mild mucosal thickening about the left maxillary sinus, the remainder of the left-sided paranasal sinuses are otherwise relatively clear without abnormality. Mastoid air cells and middle ear cavities are well pneumatized and free of fluid. Soft tissues: Probable mild soft tissue swelling about the right periorbital soft tissues, suggesting possible mild periorbital cellulitis. No discrete soft tissue abscess or collection. Limited intracranial: Unremarkable. No visible evidence for intracranial spread of infection at this time. IMPRESSION: 1. 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 Associated thinning and dehiscence at the right orbital floor with inflammatory changes and phlegmon involving the inferior postseptal bony right orbit. Right lamina papyracea thinned and possibly dehisced as well. No discrete intraorbital fluid collection or abscess. Associated mild right-sided proptosis. Additional thinning and dehiscence elsewhere about the right maxillary sinus, with evidence for invasion into the right retro antral fat. 2. Mild soft tissue swelling about the right periorbital soft tissues, suggesting associated periorbital cellulitis. Critical results discussed by telephone at the time of interpretation on 03/19/2021 at 10:49 pm to provider AMY COX , who verbally acknowledged these results. Per Dr. Tobie Poet, emergent ENT consultation has already been obtained. Electronically Signed   By: Jeannine Boga M.D.   On: 03/19/2021 23:01   MR ORBITS W WO CONTRAST  Result Date: 03/19/2021 CLINICAL DATA:  Pain in right eye. Additional history provided: Patient reports headache on right side of head for the past 2-3 weeks, right-sided facial numbness, occasional blurry vision. EXAM:  MRI OF THE ORBITS WITHOUT AND WITH CONTRAST TECHNIQUE: Multiplanar, multi-echo pulse sequences of the orbits and surrounding structures were acquired including fat saturation techniques, before and after intravenous contrast administration. CONTRAST:  7.24mL GADAVIST GADOBUTROL  1 MMOL/ML IV SOLN COMPARISON:  Head CT 03/19/2021. FINDINGS: Orbits: Osseous thinning with foci of possible dehiscence within the right orbital floor/roof of right maxillary sinus were better appreciated on the head CT performed earlier today. There is adjacent abnormal T2 hyperintense enhancing soft tissue within the inferior extraconal right orbit measuring up to 5 mm in thickness (for instance as seen on series 13, image 29) (series 7, image 26). This is highly concerning for extension of infection from the right maxillary sinus with resultant postseptal orbital cellulitis/phlegmon at this site. This abnormal soft tissue abuts the right inferior rectus muscle. The globes are normal in size and contour. The extraocular muscles and optic nerve sheath complexes are otherwise symmetric and unremarkable. Subtle right proptosis. Visualized sinuses: The right maxillary sinus is completely opacified with complex material. Osseous thinning with foci of possible dehiscence in the right orbital floor/roof of right maxillary sinus, better appreciated on the head CT performed earlier today. Near complete T2 hyperintense opacification of the anterior right ethmoid air cells. Trace mucosal thickening elsewhere within the ethmoid sinuses, and within the visualized left maxillary sinus. Soft tissues: T2 hyperintense signal abnormality and enhancement within the right periorbital soft tissues. Limited intracranial: No evidence of acute intracranial abnormality within the field of view. These results were called by telephone at the time of interpretation on 03/19/2021 at 8:25 pm to provider Dr. Joni Fears, who verbally acknowledged these results. IMPRESSION: The  right maxillary sinus is completely opacified with complex material. Associated osseous thinning and possible foci of dehiscence in the right orbital floor/roof of maxillary sinus, better appreciated on the head CT performed earlier today. There is adjacent abnormal enhancing soft tissue within the inferior extraconal right orbit, measuring up to 5 mm in thickness. This is favored to reflect extension of infection from the right maxillary sinus with resultant right post-septal orbital cellulitis/phlegmon. This abnormal enhancing soft tissue abuts the right inferior rectus muscle. Subtle right proptosis. Signal abnormality and enhancement within the right periorbital soft tissues, likely reflecting a component of periorbital cellulitis. Additional paranasal sinus disease, as described. Most notably, there is severe partial opacification of the anterior right ethmoid air cells (overall right ostiomeatal unit obstructive pattern). ENT and ophthalmology consultation recommended. Electronically Signed   By: Kellie Simmering D.O.   On: 03/19/2021 20:31  .   ASSESSMENT: Doing well after endoscopic maxillary antrostomy and ethmoidectomy.  There was no evidence of fungal invasion and the tissue submitted for frozen section.  Still waiting on permanent sections.  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  As long as there is no evidence of invasive fungal disease on final pathology, he would just be managed with antibiotic therapy.  For now we can hold any concerns for transfer.  PLAN: Continue IV antibiotics.  Saline irrigation to help clear blood clots and secretions from the right nasal cavity.  He should probably not use his CPAP for a couple of weeks to reduce the risk of orbital emphysema given the concern for a defect in the orbital  floor.   [name], MD 03/20/2021 5:27 PM Patient ID: Daniel Holden, male   DOB: [birth year], 75 y.o.   MRN: [id number]

## 2021-03-20 NOTE — Transfer of Care (Signed)
Immediate Anesthesia Transfer of Care Note  Patient: Daniel Holden  Procedure(s) Performed: MAXILLARY ANTROSTOMY (Right) ETHMOIDECTOMY (Right) IMAGE GUIDED SINUS SURGERY  Patient Location: PACU  Anesthesia Type:General  Level of Consciousness: awake  Airway & Oxygen Therapy: Patient Spontanous Breathing and Patient connected to face mask oxygen  Post-op Assessment: Report given to RN and Post -op Vital signs reviewed and stable  Post vital signs: Reviewed and stable  Last Vitals:  Vitals Value Taken Time  BP 109/70 03/20/21 0924  Temp 36.4 C 03/20/21 0924  Pulse 81 03/20/21 0925  Resp 18 03/20/21 0925  SpO2 99 % 03/20/21 0925  Vitals shown include unvalidated device data.  Last Pain:  Vitals:   03/20/21 0924  TempSrc:   PainSc: 0-No pain         Complications: No notable events documented.

## 2021-03-20 NOTE — Anesthesia Procedure Notes (Signed)
Procedure Name: Intubation Date/Time: 03/20/2021 7:40 AM Performed by: Aline Brochure, CRNA Pre-anesthesia Checklist: Patient identified, Emergency Drugs available, Suction available and Patient being monitored Patient Re-evaluated:Patient Re-evaluated prior to induction Oxygen Delivery Method: Circle system utilized Preoxygenation: Pre-oxygenation with 100% oxygen Induction Type: IV induction Ventilation: Mask ventilation without difficulty Laryngoscope Size: McGraph and 4 Grade View: Grade I Tube type: Oral Number of attempts: 1 Airway Equipment and Method: Stylet Placement Confirmation: ETT inserted through vocal cords under direct vision, positive ETCO2 and breath sounds checked- equal and bilateral Secured at: 22 cm Tube secured with: Tape Dental Injury: Teeth and Oropharynx as per pre-operative assessment

## 2021-03-20 NOTE — ED Notes (Signed)
Transport is still in the works. Admit to floor until transfer bed open.

## 2021-03-20 NOTE — Anesthesia Postprocedure Evaluation (Signed)
Anesthesia Post Note  Patient: Daniel Holden  Procedure(s) Performed: MAXILLARY ANTROSTOMY (Right) ETHMOIDECTOMY (Right) IMAGE GUIDED SINUS SURGERY  Patient location during evaluation: PACU Anesthesia Type: General Level of consciousness: awake and alert and oriented Pain management: pain level controlled Vital Signs Assessment: post-procedure vital signs reviewed and stable Respiratory status: spontaneous breathing, nonlabored ventilation and respiratory function stable Cardiovascular status: blood pressure returned to baseline and stable Postop Assessment: no signs of nausea or vomiting Anesthetic complications: no   No notable events documented.   Last Vitals:  Vitals:   03/20/21 0945 03/20/21 1000  BP: 131/68 136/74  Pulse: 69 68  Resp: 18 14  Temp:  (!) 36.4 C  SpO2: 93% 96%    Last Pain:  Vitals:   03/20/21 1000  TempSrc:   PainSc: 0-No pain                 Ceceilia Cephus

## 2021-03-20 NOTE — ED Notes (Signed)
Lab aware of lab draw

## 2021-03-20 NOTE — Telephone Encounter (Signed)
Patient called [facility] to let us know that he had emergent surgery and supposed to be discharged soon. I asked patient to receive clearance to come back to exercise and to note if he has any restrictions. Patient plans to return next Monday, 10/3 unless his doctor says otherwise. Clearance is needed first.

## 2021-03-20 NOTE — Anesthesia Preprocedure Evaluation (Signed)
Anesthesia Evaluation  Patient identified by MRN, date of birth, ID band Patient awake    Reviewed: Allergy & Precautions, NPO status , Patient's Chart, lab work & pertinent test results  History of Anesthesia Complications Negative for: history of anesthetic complications  Airway Mallampati: II  TM Distance: >3 FB Neck ROM: Full    Dental no notable dental hx.    Pulmonary sleep apnea and Continuous Positive Airway Pressure Ventilation , neg COPD,    breath sounds clear to auscultation- rhonchi (-) wheezing      Cardiovascular hypertension, Pt. 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   No date: Carpal tunnel syndrome No date: HLD (hyperlipidemia) No date: Hypertension No date: Lung nodule No date: Obesity 2015: Pancreatitis No date: Sleep apnea   Reproductive/Obstetrics                             Anesthesia Physical Anesthesia Plan  ASA: 3  Anesthesia Plan: General   Post-op Pain Management:    Induction: Intravenous  PONV Risk Score and Plan: 1 and Ondansetron and  Dexamethasone  Airway Management Planned: Oral ETT  Additional Equipment:   Intra-op Plan:   Post-operative Plan: Extubation in OR  Informed Consent: I have reviewed the patients History and Physical, chart, labs and discussed the procedure including the risks, benefits and alternatives for the proposed anesthesia with the patient or authorized representative who has indicated his/her understanding and acceptance.     Dental advisory given  Plan Discussed with: CRNA and Anesthesiologist  Anesthesia Plan Comments:         Anesthesia Quick Evaluation

## 2021-03-20 NOTE — Consult Note (Signed)
NAME: Daniel Holden  DOB: 1945-11-21  MRN: [id number]  Date/Time: 03/20/2021 1:43 PM  REQUESTING PROVIDER: Dr.Choi Subjective:  REASON FOR CONSULT: Acute sinusitis ? Daniel Holden is a 75 y.o. male with a history of Pre Diabetes mellitus, hypertension, obstructive sleep apnea, CAD, pulmonary emphysema, neuropathy presented to the ED on 03/19/2021 with headache of 2-3 weeks duration and right-sided facial numbness for 1 week duration.  He also complained of some blurry vision over the past week.   He recently had a routine visit with ENT and fluticasone prescription was refilled- He has been using nasal drops for many years secondary to chronic nasal stuffiness- he has used nasal lavage in the past  . In the ED vitals BP 137/73, heart rate 72, temperature 98.4, sats 96%. Labs revealed a WBC of 12.4, Hb 12.4, platelet, creatinine of 0.88 and glucose of 200. CT of the head without contrast showed complete opacification of the right maxillary sinus and right sided ethmoid air cells extending into the hypoplastic right frontal sinus.  There was fat stranding noted along the inferior orbital Along the maxillary sinus extending to the inferior orbit. MRI orbits was done and it showed osseous thinning and possible foci of dehiscence in the right orbital floor/roof of the maxillary sinus.  Adjacent abnormal enhancing soft tissue within the inferior extraconal right orbit measuring up to 5 mm in thickness.  This was favored to reflect extension of infection in the right maxillary sinus with resultant right post septal orbital cellulitis/phlegmon.  Patient was started on Unasyn s after blood cultures were sent ENT and ophthalmology were consulted.  ENT did a bedside scope and  pus was visualized. They tried to transfer him to an academic center over the weekend but no bed availability. Today he was taken for surgery by ENT and underwent Right endoscopic maxillary antrostomy with tissue removal and right  anterior ethmoidectomy.  On visualization there was markedly inflamed tissue within the right maxillary and anterior ethmoid sinuses with fungal like debris filling the right maxillary sinus.  Frozen sections from the mucosa of the maxillary sinus did not show any invasive fungal sinusitis. I am seeing the patient for same No recent antibioics or steroids Has been lavaging the nostril with tap water Past Medical History:  Diagnosis Date   Allergy    Angina of effort ([place])    Anxiety    CAD (coronary artery disease), native coronary artery    coronary CTA showing aortic atherosclerosis with very high coronary Ca score at 4630 with severely calcified RCA and LAD > 70% and 50-69% prox to mid LCx.     Carpal tunnel syndrome    HLD (hyperlipidemia)    Hypertension    Lung nodule    Obesity    Pancreatitis 2015   Sleep apnea     Past Surgical History:  Procedure Laterality Date   APPENDECTOMY     Patient states he did not have an appendectomy and does not know why this is in his chart. [place] 06/28/2020   CARPAL TUNNEL RELEASE     KNEE ARTHROSCOPY WITH MEDIAL MENISECTOMY Left 06/25/2019   Procedure: KNEE ARTHROSCOPY WITH MEDIAL MENISECTOMY;  Surgeon: Thornton Park, MD;  Location: [facility];  Service: Orthopedics;  Laterality: Left;   LEFT HEART CATH AND CORONARY ANGIOGRAPHY N/A 10/04/2020   Procedure: LEFT HEART CATH AND CORONARY ANGIOGRAPHY;  Surgeon: Leonie Man, MD;  Location: [facility];  Service: Cardiovascular;  Laterality: N/A;   SHOULDER SURGERY  TONSILLECTOMY      Social History   Socioeconomic History   Marital status: Married    Spouse name: Not on file   Number of children: Not on file   Years of education: Not on file   Highest education level: Not on file  Occupational History   Not on file  Tobacco Use   Smoking status: Never   Smokeless tobacco: Never  Vaping Use   Vaping Use: Never used  Substance and Sexual Activity   Alcohol use: Yes     Alcohol/week: 2.0 standard drinks    Types: 2 Cans of beer per week    Comment: 6 per day   Drug use: No   Sexual activity: Not on file  Other Topics Concern   Not on file  Social History Narrative   Not on file   Social Determinants of Health   Financial Resource Strain: Not on file  Food Insecurity: Not on file  Transportation Needs: Not on file  Physical Activity: Not on file  Stress: Not on file  Social Connections: Not on file  Intimate Partner Violence: Not on file    Family History  Problem Relation Age of Onset   CVA Father    Allergies  Allergen Reactions   Dilaudid [Hydromorphone Hcl] Nausea And Vomiting   I? Current Facility-Administered Medications  Medication Dose Route Frequency Provider Last Rate Last Admin   0.9 %  sodium chloride infusion   Intravenous PRN Cox, Amy N, DO 0 mL/hr at 03/20/21 0417 New Bag at 03/20/21 0867   acetaminophen (TYLENOL) tablet 650 mg  650 mg Oral Q6H PRN Cox, Amy N, DO   650 mg at 03/19/21 2321   Or   acetaminophen (TYLENOL) suppository 650 mg  650 mg Rectal Q6H PRN Cox, Amy N, DO       ALPRAZolam Duanne Moron) tablet 0.25 mg  0.25 mg Oral QHS PRN Cox, Amy N, DO   0.25 mg at 03/19/21 2245   amLODipine (NORVASC) tablet 5 mg  5 mg Oral Daily Cox, Amy N, DO   5 mg at 03/20/21 1137   atorvastatin (LIPITOR) tablet 80 mg  80 mg Oral Daily Dessa Phi, DO       benazepril (LOTENSIN) tablet 20 mg  20 mg Oral Daily Cox, Amy N, DO   20 mg at 03/20/21 1243   ceFEPIme (MAXIPIME) 2 g in sodium chloride 0.9 % 100 mL IVPB  2 g Intravenous Q8H Belue, Alver Sorrow, RPH 200 mL/hr at 03/20/21 0425 Infusion Verify at 03/20/21 0425   cholecalciferol (VITAMIN D) tablet 5,000 Units  5,000 Units Oral Daily Cox, Amy N, DO   5,000 Units at 03/20/21 1136   erythromycin ophthalmic ointment 1 application  1 application Right Eye Y1P Dessa Phi, DO       fluticasone (FLONASE) 50 MCG/ACT nasal spray 2 spray  2 spray Each Nare Daily Cox, Amy N, DO       gabapentin  (NEURONTIN) capsule 300 mg  300 mg Oral BID Cox, Amy N, DO   300 mg at 03/20/21 1138   insulin aspart (novoLOG) injection 0-5 Units  0-5 Units Subcutaneous QHS Cox, Amy N, DO       insulin aspart (novoLOG) injection 0-9 Units  0-9 Units Subcutaneous TID WC Cox, Amy N, DO   2 Units at 03/20/21 1139   melatonin tablet 10 mg  10 mg Oral QHS Cox, Amy N, DO       metoprolol tartrate (LOPRESSOR) tablet 50  mg  50 mg Oral BID Cox, Amy N, DO   50 mg at 03/20/21 1138   ondansetron (ZOFRAN) tablet 4 mg  4 mg Oral Q6H PRN Cox, Amy N, DO       Or   ondansetron (ZOFRAN) injection 4 mg  4 mg Intravenous Q6H PRN Cox, Amy N, DO       pantoprazole (PROTONIX) EC tablet 40 mg  40 mg Oral Q breakfast Renda Rolls, RPH   40 mg at 03/20/21 1138   ranolazine (RANEXA) 12 hr tablet 1,000 mg  1,000 mg Oral BID Cox, Amy N, DO   1,000 mg at 03/20/21 1244   vitamin B-12 (CYANOCOBALAMIN) tablet 1,000 mcg  1,000 mcg Oral Daily Cox, Amy N, DO   1,000 mcg at 03/20/21 1138     Abtx:  Anti-infectives (From admission, onward)    Start     Dose/Rate Route Frequency Ordered Stop   03/20/21 0400  fluconazole (DIFLUCAN) IVPB 200 mg  Status:  Discontinued        200 mg 100 mL/hr over 60 Minutes Intravenous Daily 03/20/21 0243 03/20/21 1215   03/20/21 0300  ceFEPIme (MAXIPIME) 2 g in sodium chloride 0.9 % 100 mL IVPB        2 g 200 mL/hr over 30 Minutes Intravenous Every 8 hours 03/20/21 0139     03/20/21 0030  vancomycin (VANCOREADY) IVPB 2000 mg/400 mL  Status:  Discontinued        2,000 mg 200 mL/hr over 120 Minutes Intravenous  Once 03/20/21 0024 03/20/21 0126   03/20/21 0000  Ampicillin-Sulbactam (UNASYN) 3 g in sodium chloride 0.9 % 100 mL IVPB  Status:  Discontinued        3 g 200 mL/hr over 30 Minutes Intravenous Every 6 hours 03/19/21 2001 03/20/21 0137   03/19/21 1845  Ampicillin-Sulbactam (UNASYN) 3 g in sodium chloride 0.9 % 100 mL IVPB        3 g 200 mL/hr over 30 Minutes Intravenous  Once 03/19/21 1830 03/19/21  1854       REVIEW OF SYSTEMS:  Const: negative fever, negative chills, negative weight loss Eyes: negative diplopia + visual changes, pain rt side, watering, swelling lower eyelid ENT: negative coryza, negative sore throat Resp: negative cough, hemoptysis, dyspnea Cards: negative for chest pain, palpitations, lower extremity edema GU: negative for frequency, dysuria and hematuria GI: Negative for abdominal pain, diarrhea, bleeding, constipation Skin: negative for rash and pruritus Heme: negative for easy bruising and gum/nose bleeding MS: negative for myalgias, arthralgias, back pain and muscle weakness Neurolo:headaches Psych: negative for feelings of anxiety, depression  Endocrine: pre diabetes Allergy/Immunology- negative for any medication or food allergies ?  Objective:  VITALS:  BP (!) 141/72 (BP Location: Left Arm)   Pulse 78   Temp 97.6 F (36.4 C)   Resp 18   Ht 5\' 6"  (1.676 m)   Wt 89.8 kg   SpO2 98%   BMI 31.95 kg/m  PHYSICAL EXAM:  General: Alert, cooperative, no distress, appears stated age.  Head: Normocephalic, without obvious abnormality, atraumatic. Eyes: swelling lower lid ENT rt nares packing. Lips, mucosa, and tongue normal. No Thrush Neck: Supple, symmetrical, no adenopathy, thyroid: non tender no carotid bruit and no JVD. Back: No CVA tenderness. Lungs: Clear to auscultation bilaterally. No Wheezing or Rhonchi. No rales. Heart: Regular rate and rhythm, no murmur, rub or gallop. Abdomen: Soft, non-tender,not distended. Bowel sounds normal. No masses Extremities: atraumatic, no cyanosis. No edema. No clubbing Skin:  No rashes or lesions. Or bruising Lymph: Cervical, supraclavicular normal. Neurologic: Grossly non-focal Pertinent Labs Lab Results CBC    Component Value Date/Time   WBC 9.2 03/20/2021 0403   RBC 3.21 (L) 03/20/2021 0403   HGB 11.6 (L) 03/20/2021 0403   HGB 10.5 (L) 01/18/2021 0945   HCT 31.8 (L) 03/20/2021 0403   HCT 31.2  (L) 01/18/2021 0945   PLT 244 03/20/2021 0403   PLT 281 01/18/2021 0945   MCV 99.1 03/20/2021 0403   MCV 102 (H) 01/18/2021 0945   MCV 95 09/27/2013 0319   MCH 36.1 (H) 03/20/2021 0403   MCHC 36.5 (H) 03/20/2021 0403   RDW 12.3 03/20/2021 0403   RDW 11.8 01/18/2021 0945   RDW 15.1 (H) 09/27/2013 0319   LYMPHSABS 1.9 03/19/2021 1544   LYMPHSABS 1.3 01/18/2021 0945   LYMPHSABS 0.3 (L) 09/27/2013 0319   MONOABS 1.3 (H) 03/19/2021 1544   MONOABS 0.5 09/27/2013 0319   EOSABS 0.1 03/19/2021 1544   EOSABS 0.1 01/18/2021 0945   EOSABS 0.0 09/27/2013 0319   BASOSABS 0.1 03/19/2021 1544   BASOSABS 0.1 01/18/2021 0945   BASOSABS 0.0 09/27/2013 0319    CMP Latest Ref Rng & Units 03/20/2021 03/19/2021 03/03/2021  Glucose 70 - 99 mg/dL 150(H) 179(H) 126(H)  BUN 8 - 23 mg/dL 16 21 -  Creatinine 0.61 - 1.24 mg/dL 0.89 0.88 -  Sodium 135 - 145 mmol/L 128(L) 123(L) -  Potassium 3.5 - 5.1 mmol/L 4.3 4.3 -  Chloride 98 - 111 mmol/L 95(L) 88(L) -  CO2 22 - 32 mmol/L 26 24 -  Calcium 8.9 - 10.3 mg/dL 9.2 9.6 -  Total Protein 6.5 - 8.1 g/dL - 7.9 -  Total Bilirubin 0.3 - 1.2 mg/dL - 1.4(H) -  Alkaline Phos 38 - 126 U/L - 66 -  AST 15 - 41 U/L - 14(L) -  ALT 0 - 44 U/L - 7 -      Microbiology: Recent Results (from the past 240 hour(s))  MRSA Next Gen by PCR, Nasal     Status: None   Collection Time: 03/19/21  9:54 PM   Specimen: Nasopharyngeal Swab; Nasal Swab  Result Value Ref Range Status   MRSA by PCR Next Gen NOT DETECTED NOT DETECTED Final    Comment: (NOTE) The GeneXpert MRSA Assay (FDA approved for NASAL specimens only), is one component of a comprehensive MRSA colonization surveillance program. It is not intended to diagnose MRSA infection nor to guide or monitor treatment for MRSA infections. Test performance is not FDA approved in patients less than 61 years old. Performed at [facility], [address] [place], [place] [address]   Resp Panel by RT-PCR (Flu A&B,  Covid) Nasopharyngeal Swab     Status: None   Collection Time: 03/19/21 10:18 PM   Specimen: Nasopharyngeal Swab; Nasopharyngeal(NP) swabs in vial transport medium  Result Value Ref Range Status   SARS Coronavirus 2 by RT PCR NEGATIVE NEGATIVE Final    Comment: (NOTE) SARS-CoV-2 target nucleic acids are NOT DETECTED.  The SARS-CoV-2 RNA is generally detectable in upper respiratory specimens during the acute phase of infection. The lowest concentration of SARS-CoV-2 viral copies this assay can detect is 138 copies/mL. A negative result does not preclude SARS-Cov-2 infection and should not be used as the sole basis for treatment or other patient management decisions. A negative result may occur with  improper specimen collection/handling, submission of specimen other than nasopharyngeal swab, presence of viral mutation(s) within the areas  targeted by this assay, and inadequate number of viral copies(<138 copies/mL). A negative result must be combined with clinical observations, patient history, and epidemiological information. The expected result is Negative.  Fact Sheet for Patients:  EntrepreneurPulse.com.au  Fact Sheet for Healthcare Providers:  IncredibleEmployment.be  This test is no t yet approved or cleared by the [place] FDA and  has been authorized for detection and/or diagnosis of SARS-CoV-2 by FDA under an Emergency Use Authorization (EUA). This EUA will remain  in effect (meaning this test can be used) for the duration of the COVID-19 declaration under Section 564(b)(1) of the Act, 21 U.S.C.section 360bbb-3(b)(1), unless the authorization is terminated  or revoked sooner.       Influenza A by PCR NEGATIVE NEGATIVE Final   Influenza B by PCR NEGATIVE NEGATIVE Final    Comment: (NOTE) The Xpert Xpress SARS-CoV-2/FLU/RSV plus assay is intended as an aid in the diagnosis of influenza from Nasopharyngeal swab specimens and should  not be used as a sole basis for treatment. Nasal washings and aspirates are unacceptable for Xpert Xpress SARS-CoV-2/FLU/RSV testing.  Fact Sheet for Patients: EntrepreneurPulse.com.au  Fact Sheet for Healthcare Providers: IncredibleEmployment.be  This test is not yet approved or cleared by the [place] FDA and has been authorized for detection and/or diagnosis of SARS-CoV-2 by FDA under an Emergency Use Authorization (EUA). This EUA will remain in effect (meaning this test can be used) for the duration of the COVID-19 declaration under Section 564(b)(1) of the Act, 21 U.S.C. section 360bbb-3(b)(1), unless the authorization is terminated or revoked.  Performed at [facility], [address] [place], [place] [address]   CULTURE, BLOOD (ROUTINE X 2) w Reflex to ID Panel     Status: None (Preliminary result)   Collection Time: 03/20/21 12:58 AM   Specimen: BLOOD  Result Value Ref Range Status   Specimen Description BLOOD LEFT ARM  Final   Special Requests   Final    BOTTLES DRAWN AEROBIC AND ANAEROBIC Blood Culture adequate volume   Culture   Final    NO GROWTH < 12 HOURS Performed at [facility], [address] [place], [place] [address]    Report Status PENDING  Incomplete  CULTURE, BLOOD (ROUTINE X 2) w Reflex to ID Panel     Status: None (Preliminary result)   Collection Time: 03/20/21 12:58 AM   Specimen: BLOOD  Result Value Ref Range Status   Specimen Description BLOOD LEFT HAND  Final   Special Requests   Final    BOTTLES DRAWN AEROBIC AND ANAEROBIC Blood Culture adequate volume   Culture   Final    NO GROWTH < 12 HOURS Performed at [facility], [address] [place],  [address]    Report Status PENDING  Incomplete    IMAGING RESULTS:  I have personally reviewed the films ?Extensive right-sided paranasal sinusitis, with evidence for acute invasive right maxillary sinusitis,  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 Right lamina papyracea thinned and possibly dehisced as well. No discrete intraorbital fluid collection or abscess. Associated mild right-sided proptosis. Additional thinning and dehiscence elsewhere about the right maxillary sinus, with evidence for invasion into the right retro antral fat. 2. Mild soft tissue swelling about the right periorbital soft tissues, suggesting associated periorbital cellulitis  Impression/Recommendation Extensive rt sided pansinusitis with periorbital cellulitis ? ?Fungal rhinosinusitis  was a concern on the imaging but frozen section of the sinus mucosa taken during surgery today  did not show any hyphae- On cefepime for bacterial infection. Will add flagyl MRSA nares negative Pt stable and is not septic- will await culture result Will do beta b glucan and fungal antibodies  S/p rt maxillary antrostomy, with tissue removal, rt anterior ethmoidectomy  Pre diabetes  OSA  HTn  ? ___________________________________________________ Discussed with patient, and wife in detail Note:  This document was prepared using Dragon voice recognition software and may include unintentional dictation errors.

## 2021-03-20 NOTE — ED Notes (Signed)
I stated that to Dr. Richardson Landry that the patient was not accepted at [facility] we are waiting for the hospitalist was paged and I talked to Dr. Redmond Baseman from [facility] who did not accept the patient so I circled back with Dr. Richardson Landry to see if he can do a nasal endoscopy and biopsy in the morning until the patient is transferred and I added IV Diflucan to cover fungal infection.  That is the best that I can do now in the middle of admissions and floor coverage.  Thank you for your help. This is Daniel Holden , MD note.

## 2021-03-20 NOTE — Progress Notes (Addendum)
PROGRESS NOTE    Daniel Holden  IHW:[id number] DOB: August 25, 1945 DOA: 03/19/2021 PCP: Cletis Athens, MD     Brief Narrative:  Daniel Holden is a 75 y.o. male with medical history significant for glucose intolerance, A1c of 6.4 in July 2021, hypertension, hyperlipidemia, obstructive sleep apnea, CAD, on CPAP nightly, pulmonary emphysema, obesity class I, anxiety, diabetic neuropathy, GERD, insomnia, who presents emergency department for chief concerns of headache and right-sided facial numbness. Work up with CT and MRI revealed opacification of right maxillary sinus, right periorbital cellulitis.  Case was discussed with ophthalmology who recommended IV antibiotics and MRI orbits.  They recommended ENT consultation for debridement.  ENT was consulted.  Numerous providers have called [name], [facility] for transfer.  [facility] and [facility] have declined to accept patient.  [facility] accepted but there were no beds available 9/25 or 9/26.  Patient was ultimately taken to the OR by Dr. Richardson Landry on 9/26.  New events last 24 hours / Subjective: Patient seen post-op. He is sitting in bed, feeling well overall. Has some maxillary sinus discomfort but no worse than at time of admission, still having some blurry vision of right eye.   Assessment & Plan:   Principal Problem:   Maxillary sinusitis Active Problems:   OSA on CPAP   Anxiety   Class 1 obesity due to excess calories with serious comorbidity and body mass index (BMI) of 32.0 to 32.9 in adult   Neuropathy   Primary hypertension   Hepatic steatosis   Periorbital cellulitis   Right maxillary sinusitis, orbital cellulitis -MRI orbits: The right maxillary sinus is completely opacified with complex material. Associated osseous thinning and possible foci of dehiscence in the right orbital floor/roof of maxillary sinus -CT maxillofacial: Extensive right-sided paranasal sinusitis, with evidence for acute invasive right maxillary sinusitis, possibly  invasive fungal rhinosinusitis (AIFRS). Associated thinning and dehiscence at the right orbital floor with inflammatory changes and phlegmon involving the inferior postseptal bony right orbit. Right lamina papyracea thinned and possibly dehisced as well. No discrete intraorbital fluid collection or abscess. Associated mild right-sided proptosis. Additional thinning and dehiscence elsewhere about the right maxillary sinus, with evidence for invasion into the right retro antral fat. Mild soft tissue swelling about the right periorbital soft tissues, suggesting associated periorbital cellulitis. -Status post right endoscopic maxillary antrostomy with tissue removal, right anterior ethmoidectomy by Dr. Richardson Landry 9/26. Did not see invasive hyphae in frozen sections  -Tissue biopsies pending. Treat as bacterial infection superimposed on allergic non-invasive fungal sinusitis   -Continue Cefepime, ID consulted for further recs  -Discussed with ophthalmology for right eye discomfort, recommended for erythromycin ointment 2-3 times daily for couple of days, follow up with [facility] after discharge   Hyperglycemia -Hemoglobin A1c is pending -SSI   OSA -Hold off on CPAP use  HTN -Continue Norvasc, lotensin, lopressor    HLD -Continue Lipitor  CAD -Continue Ranexa  -Aspirin on hold   Anxiety  -Continue Xanax    DVT prophylaxis:  Place TED hose Start: 03/19/21 1959  Code Status:     Code Status Orders  (From admission, onward)           Start     Ordered   03/19/21 1959  Full code  Continuous        03/19/21 2002           Code Status History     Date Active Date Inactive Code Status Order ID Comments User Context  10/04/2020 1026 10/04/2020 1913 Full Code [id number]  Leonie Man, MD Inpatient      Advance Directive Documentation    Flowsheet Row Most Recent Value  Type of Advance Directive Healthcare Power of Attorney  Pre-existing out of facility DNR  order (yellow form or pink MOST form) --  "MOST" Form in Place? --      Family Communication: At bedside Disposition Plan:  Status is: Inpatient  Remains inpatient appropriate because:Inpatient level of care appropriate due to severity of illness  Dispo: The patient is from: Home              Anticipated d/c is to: Home              Patient currently is not medically stable to d/c.   Difficult to place patient No      Consultants:  ENT   Procedures:  Status post right endoscopic maxillary antrostomy with tissue removal, right anterior ethmoidectomy by Dr. Richardson Landry 9/26  Antimicrobials:  Anti-infectives (From admission, onward)    Start     Dose/Rate Route Frequency Ordered Stop   03/20/21 0400  fluconazole (DIFLUCAN) IVPB 200 mg  Status:  Discontinued        200 mg 100 mL/hr over 60 Minutes Intravenous Daily 03/20/21 0243 03/20/21 1215   03/20/21 0300  ceFEPIme (MAXIPIME) 2 g in sodium chloride 0.9 % 100 mL IVPB        2 g 200 mL/hr over 30 Minutes Intravenous Every 8 hours 03/20/21 0139     03/20/21 0030  vancomycin (VANCOREADY) IVPB 2000 mg/400 mL  Status:  Discontinued        2,000 mg 200 mL/hr over 120 Minutes Intravenous  Once 03/20/21 0024 03/20/21 0126   03/20/21 0000  Ampicillin-Sulbactam (UNASYN) 3 g in sodium chloride 0.9 % 100 mL IVPB  Status:  Discontinued        3 g 200 mL/hr over 30 Minutes Intravenous Every 6 hours 03/19/21 2001 03/20/21 0137   03/19/21 1845  Ampicillin-Sulbactam (UNASYN) 3 g in sodium chloride 0.9 % 100 mL IVPB        3 g 200 mL/hr over 30 Minutes Intravenous  Once 03/19/21 1830 03/19/21 1854        Objective: Vitals:   03/20/21 1000 03/20/21 1015 03/20/21 1023 03/20/21 1210  BP: 136/74 (!) 144/71  (!) 141/72  Pulse: 68 65 66 78  Resp: 14  18 18   Temp: (!) 97.5 F (36.4 C)  97.7 F (36.5 C) 97.6 F (36.4 C)  TempSrc:      SpO2: 96%  95% 98%  Weight:      Height:        Intake/Output Summary (Last 24 hours) at 03/20/2021  1229 Last data filed at 03/20/2021 1000 Gross per 24 hour  Intake 961.07 ml  Output --  Net 961.07 ml   Filed Weights   03/19/21 1544 03/20/21 0318 03/20/21 0711  Weight: 88.5 kg 89.8 kg 89.8 kg    Examination:  General exam: Appears calm and comfortable  Respiratory system: Clear to auscultation. Respiratory effort normal. No respiratory distress. No conversational dyspnea.  Cardiovascular system: S1 & S2 heard, RRR. No murmurs. No pedal edema. Gastrointestinal system: Abdomen is nondistended, soft and nontender. Normal bowel sounds heard. Central nervous system: Alert and oriented Extremities: Symmetric in appearance  Skin: No rashes, lesions or ulcers on exposed skin  Psychiatry: Judgement and insight appear normal. Mood & affect appropriate.   Data Reviewed: I have personally  reviewed following labs and imaging studies  CBC: Recent Labs  Lab 03/19/21 1544 03/20/21 0403  WBC 12.4* 9.2  NEUTROABS 9.0*  --   HGB 12.4* 11.6*  HCT 34.1* 31.8*  MCV 97.4 99.1  PLT 292 364   Basic Metabolic Panel: Recent Labs  Lab 03/19/21 1544 03/20/21 0403  NA 123* 128*  K 4.3 4.3  CL 88* 95*  CO2 24 26  GLUCOSE 179* 150*  BUN 21 16  CREATININE 0.88 0.89  CALCIUM 9.6 9.2   GFR: Estimated Creatinine Clearance: 78.7 mL/min (by C-G formula based on SCr of 0.89 mg/dL). Liver Function Tests: Recent Labs  Lab 03/19/21 1544  AST 14*  ALT 7  ALKPHOS 66  BILITOT 1.4*  PROT 7.9  ALBUMIN 4.2   No results for input(s): LIPASE, AMYLASE in the last 168 hours. No results for input(s): AMMONIA in the last 168 hours. Coagulation Profile: No results for input(s): INR, PROTIME in the last 168 hours. Cardiac Enzymes: No results for input(s): CKTOTAL, CKMB, CKMBINDEX, TROPONINI in the last 168 hours. BNP (last 3 results) No results for input(s): PROBNP in the last 8760 hours. HbA1C: No results for input(s): HGBA1C in the last 72 hours. CBG: Recent Labs  Lab 03/19/21 1549  03/19/21 2235 03/20/21 0720 03/20/21 0927 03/20/21 1131  GLUCAP 200* 161* 142* 152* 179*   Lipid Profile: No results for input(s): CHOL, HDL, LDLCALC, TRIG, CHOLHDL, LDLDIRECT in the last 72 hours. Thyroid Function Tests: No results for input(s): TSH, T4TOTAL, FREET4, T3FREE, THYROIDAB in the last 72 hours. Anemia Panel: Recent Labs    03/20/21 0403  FERRITIN 234   Sepsis Labs: Recent Labs  Lab 03/19/21 1544  PROCALCITON <0.10    Recent Results (from the past 240 hour(s))  MRSA Next Gen by PCR, Nasal     Status: None   Collection Time: 03/19/21  9:54 PM   Specimen: Nasopharyngeal Swab; Nasal Swab  Result Value Ref Range Status   MRSA by PCR Next Gen NOT DETECTED NOT DETECTED Final    Comment: (NOTE) The GeneXpert MRSA Assay (FDA approved for NASAL specimens only), is one component of a comprehensive MRSA colonization surveillance program. It is not intended to diagnose MRSA infection nor to guide or monitor treatment for MRSA infections. Test performance is not FDA approved in patients less than 64 years old. Performed at [facility], [address] [place], [place] [address]   Resp Panel by RT-PCR (Flu A&B, Covid) Nasopharyngeal Swab     Status: None   Collection Time: 03/19/21 10:18 PM   Specimen: Nasopharyngeal Swab; Nasopharyngeal(NP) swabs in vial transport medium  Result Value Ref Range Status   SARS Coronavirus 2 by RT PCR NEGATIVE NEGATIVE Final    Comment: (NOTE) SARS-CoV-2 target nucleic acids are NOT DETECTED.  The SARS-CoV-2 RNA is generally detectable in upper respiratory specimens during the acute phase of infection. The lowest concentration of SARS-CoV-2 viral copies this assay can detect is 138 copies/mL. A negative result does not preclude SARS-Cov-2 infection and should not be used as the sole basis for treatment or other patient management decisions. A negative result may occur with  improper specimen collection/handling, submission  of specimen other than nasopharyngeal swab, presence of viral mutation(s) within the areas targeted by this assay, and inadequate number of viral copies(<138 copies/mL). A negative result must be combined with clinical observations, patient history, and epidemiological information. The expected result is Negative.  Fact Sheet for Patients:  EntrepreneurPulse.com.au  Fact Sheet for Healthcare  Providers:  IncredibleEmployment.be  This test is no t yet approved or cleared by the [place] and  has been authorized for detection and/or diagnosis of SARS-CoV-2 by FDA under an Emergency Use Authorization (EUA). This EUA will remain  in effect (meaning this test can be used) for the duration of the COVID-19 declaration under Section 564(b)(1) of the Act, 21 U.S.C.section 360bbb-3(b)(1), unless the authorization is terminated  or revoked sooner.       Influenza A by PCR NEGATIVE NEGATIVE Final   Influenza B by PCR NEGATIVE NEGATIVE Final    Comment: (NOTE) The Xpert Xpress SARS-CoV-2/FLU/RSV plus assay is intended as an aid in the diagnosis of influenza from Nasopharyngeal swab specimens and should not be used as a sole basis for treatment. Nasal washings and aspirates are unacceptable for Xpert Xpress SARS-CoV-2/FLU/RSV testing.  Fact Sheet for Patients: EntrepreneurPulse.com.au  Fact Sheet for Healthcare Providers: IncredibleEmployment.be  This test is not yet approved or cleared by the [place] FDA and has been authorized for detection and/or diagnosis of SARS-CoV-2 by FDA under an Emergency Use Authorization (EUA). This EUA will remain in effect (meaning this test can be used) for the duration of the COVID-19 declaration under Section 564(b)(1) of the Act, 21 U.S.C. section 360bbb-3(b)(1), unless the authorization is terminated or revoked.  Performed at [facility], [address] [place], [place] [address]   CULTURE, BLOOD (ROUTINE X 2) w Reflex to ID Panel     Status: None (Preliminary result)   Collection Time: 03/20/21 12:58 AM   Specimen: BLOOD  Result Value Ref Range Status   Specimen Description BLOOD LEFT ARM  Final   Special Requests   Final    BOTTLES DRAWN AEROBIC AND ANAEROBIC Blood Culture adequate volume   Culture   Final    NO GROWTH < 12 HOURS Performed at [facility], [address] [place], [place] [address]    Report Status PENDING  Incomplete  CULTURE, BLOOD (ROUTINE X 2) w Reflex to ID Panel     Status: None (Preliminary result)   Collection Time: 03/20/21 12:58 AM   Specimen: BLOOD  Result Value Ref Range Status   Specimen Description BLOOD LEFT HAND  Final   Special Requests   Final    BOTTLES DRAWN AEROBIC AND ANAEROBIC Blood Culture adequate volume   Culture   Final    NO GROWTH < 12 HOURS Performed at [facility], [address], [place], [place] [address]    Report Status PENDING  Incomplete      Radiology Studies: CT HEAD WO CONTRAST  Result Date: 03/19/2021 CLINICAL DATA:  headache EXAM: CT HEAD WITHOUT CONTRAST TECHNIQUE: Contiguous axial images were obtained from the base of the skull through the vertex without intravenous contrast. COMPARISON:  None. FINDINGS: Brain: No evidence of acute infarction, hemorrhage, hydrocephalus, extra-axial collection or mass lesion/mass effect. Vascular: No hyperdense vessel or unexpected calcification. Skull: Normal. Negative for fracture or focal lesion. Sinuses/Orbits: There is complete opacification of the RIGHT maxillary sinus in the majority of the RIGHT-sided ethmoid air cells extending into the hypoplastic RIGHT frontal sinus. There is fat stranding noted along the inferior orbital rim along the maxillary sinus extending into the inferior orbit (series 4, image 16). No definitive intracranial extension is seen, but evaluation is limited. Mild mucosal thickening of the  LEFT maxillary sinus. Other: None IMPRESSION: 1. There is complete opacification of the RIGHT maxillary sinus extending into the RIGHT ethmoid air cells and  the hypoplastic RIGHT frontal sinus. There is asymmetric fat stranding noted in the inferior RIGHT orbit. This could reflect a periosteal abscess. As such, recommend further dedicated orbit evaluation with CT or MRI with contrast. Electronically Signed   By: Valentino Saxon M.D.   On: 03/19/2021 18:25   CT MAXILLOFACIAL WO CONTRAST  Result Date: 03/19/2021 CLINICAL DATA:  Initial evaluation for concern of invasive right maxillary sinusitis with right orbital involvement. EXAM: CT MAXILLOFACIAL WITHOUT CONTRAST TECHNIQUE: Multidetector CT imaging of the maxillofacial structures was performed. Multiplanar CT image reconstructions were also generated. COMPARISON:  Comparison made with previous head CT and orbital MRI from 03/19/2021. FINDINGS: Orbits/Sinuses: Right maxillary sinus is completely opacified with complex heterogeneous material. The right ethmoidal air cells are also largely opacified. Opacification of the hypoplastic right frontal sinus and frontoethmoidal recess. Mucosal thickening within the right sphenoid sinus. Thin section bone window algorithm was performed through the sinuses. Images demonstrate multifocal areas of osseous thinning and dehiscence about the right orbital floor (series 6, image 54), confirming findings seen on prior studies. Additionally, the anterior and posterior walls of the right maxillary sinus are also thin with scattered areas of dehiscence (series 3, image 58). Hazy inflammatory stranding seen within the right retro antral fat, consistent with invasive sinusitis (series 2, image 53). The right pre maxillary soft tissues are relatively preserved at this time. Stranding with inflammatory changes seen involving the extraconal fat of the inferior right orbit, again concerning for postseptal cellulitis related to  invasive sinusitis (series 5, image 30). Mild mass effect on the adjacent right inferior rectus muscle which is slightly bowed superiorly (series 5, image 31). Additionally, there is possible thinning and dehiscence at the inferior right lamina papyracea (series 6, image 43). The right lamina papyracea is slightly bowed laterally into the bony right orbit due to underlying sinus disease (series 2, image 92). No discrete abscess or drainable fluid collection seen on this noncontrast CT. Remainder of the right orbital fat is relatively maintained at this time. Mild right-sided proptosis again noted. Right globe and optic nerve otherwise unremarkable. Extra-ocular muscles otherwise unremarkable. Lacrimal gland normal. No other abnormality about the right lacrimal apparatus. Contralateral left globe and orbit within normal limits. Other than mild mucosal thickening about the left maxillary sinus, the remainder of the left-sided paranasal sinuses are otherwise relatively clear without abnormality. Mastoid air cells and middle ear cavities are well pneumatized and free of fluid. Soft tissues: Probable mild soft tissue swelling about the right periorbital soft tissues, suggesting possible mild periorbital cellulitis. No discrete soft tissue abscess or collection. Limited intracranial: Unremarkable. No visible evidence for intracranial spread of infection at this time. IMPRESSION: 1. 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 Associated thinning and dehiscence at the right orbital floor with inflammatory changes and phlegmon involving the inferior postseptal bony right orbit. Right lamina papyracea thinned and possibly dehisced as well. No discrete intraorbital fluid collection or abscess. Associated mild right-sided proptosis. Additional thinning and dehiscence elsewhere about  the right maxillary sinus, with evidence for invasion into the right retro antral fat. 2. Mild soft tissue swelling about the right periorbital soft tissues, suggesting associated periorbital cellulitis. Critical results discussed by telephone at the time of interpretation on 03/19/2021 at 10:49 pm to provider AMY COX , who verbally acknowledged these results. Per Dr. Tobie Poet, emergent ENT consultation has already been obtained. Electronically Signed   By: Jeannine Boga  M.D.   On: 03/19/2021 23:01   MR ORBITS W WO CONTRAST  Result Date: 03/19/2021 CLINICAL DATA:  Pain in right eye. Additional history provided: Patient reports headache on right side of head for the past 2-3 weeks, right-sided facial numbness, occasional blurry vision. EXAM: MRI OF THE ORBITS WITHOUT AND WITH CONTRAST TECHNIQUE: Multiplanar, multi-echo pulse sequences of the orbits and surrounding structures were acquired including fat saturation techniques, before and after intravenous contrast administration. CONTRAST:  7.37mL GADAVIST GADOBUTROL 1 MMOL/ML IV SOLN COMPARISON:  Head CT 03/19/2021. FINDINGS: Orbits: Osseous thinning with foci of possible dehiscence within the right orbital floor/roof of right maxillary sinus were better appreciated on the head CT performed earlier today. There is adjacent abnormal T2 hyperintense enhancing soft tissue within the inferior extraconal right orbit measuring up to 5 mm in thickness (for instance as seen on series 13, image 29) (series 7, image 26). This is highly concerning for extension of infection from the right maxillary sinus with resultant postseptal orbital cellulitis/phlegmon at this site. This abnormal soft tissue abuts the right inferior rectus muscle. The globes are normal in size and contour. The extraocular muscles and optic nerve sheath complexes are otherwise symmetric and unremarkable. Subtle right proptosis. Visualized sinuses: The right maxillary sinus is completely opacified with  complex material. Osseous thinning with foci of possible dehiscence in the right orbital floor/roof of right maxillary sinus, better appreciated on the head CT performed earlier today. Near complete T2 hyperintense opacification of the anterior right ethmoid air cells. Trace mucosal thickening elsewhere within the ethmoid sinuses, and within the visualized left maxillary sinus. Soft tissues: T2 hyperintense signal abnormality and enhancement within the right periorbital soft tissues. Limited intracranial: No evidence of acute intracranial abnormality within the field of view. These results were called by telephone at the time of interpretation on 03/19/2021 at 8:25 pm to provider Dr. Joni Fears, who verbally acknowledged these results. IMPRESSION: The right maxillary sinus is completely opacified with complex material. Associated osseous thinning and possible foci of dehiscence in the right orbital floor/roof of maxillary sinus, better appreciated on the head CT performed earlier today. There is adjacent abnormal enhancing soft tissue within the inferior extraconal right orbit, measuring up to 5 mm in thickness. This is favored to reflect extension of infection from the right maxillary sinus with resultant right post-septal orbital cellulitis/phlegmon. This abnormal enhancing soft tissue abuts the right inferior rectus muscle. Subtle right proptosis. Signal abnormality and enhancement within the right periorbital soft tissues, likely reflecting a component of periorbital cellulitis. Additional paranasal sinus disease, as described. Most notably, there is severe partial opacification of the anterior right ethmoid air cells (overall right ostiomeatal unit obstructive pattern). ENT and ophthalmology consultation recommended. Electronically Signed   By: Kellie Simmering D.O.   On: 03/19/2021 20:31      Scheduled Meds:  amLODipine  5 mg Oral Daily   atorvastatin  80 mg Oral Daily   benazepril  20 mg Oral Daily    cholecalciferol  5,000 Units Oral Daily   fluticasone  2 spray Each Nare Daily   gabapentin  300 mg Oral BID   insulin aspart  0-5 Units Subcutaneous QHS   insulin aspart  0-9 Units Subcutaneous TID WC   melatonin  10 mg Oral QHS   metoprolol tartrate  50 mg Oral BID   pantoprazole  40 mg Oral Q breakfast   ranolazine  1,000 mg Oral BID   cyanocobalamin  1,000 mcg Oral Daily   Continuous Infusions:  sodium chloride 0 mL/hr at 03/20/21 0417   ceFEPime (MAXIPIME) IV 200 mL/hr at 03/20/21 0425     LOS: 0 days      Time spent: 50 minutes   Dessa Phi, DO [facility] 03/20/2021, 12:29 PM   Available via Epic secure chat 7am-7pm After these hours, please refer to coverage provider listed on [email or web address]

## 2021-03-20 NOTE — Progress Notes (Signed)
Pharmacy Antibiotic Note  Daniel Holden is a 75 y.o. male admitted on 03/19/2021 with orbital bone erosion and abscess.  Pharmacy has been consulted for Cefepime dosing for pseudomonas coverage.  Plan: Cefepime 2 gm q8h per indication and renal function.  Pharmacy will continue to follow and adjust abx dosing if warranted.  Height: 5\' 7"  (170.2 cm) Weight: 88.5 kg (195 lb) IBW/kg (Calculated) : 66.1  Temp (24hrs), Avg:98.5 F (36.9 C), Min:98.4 F (36.9 C), Max:98.5 F (36.9 C)  Recent Labs  Lab 03/19/21 1544  WBC 12.4*  CREATININE 0.88    Estimated Creatinine Clearance: 80.6 mL/min (by C-G formula based on SCr of 0.88 mg/dL).    Allergies  Allergen Reactions   Dilaudid [Hydromorphone Hcl] Nausea And Vomiting    Antimicrobials this admission: 9/25 Unasyn >> x 2 doses 9/26 Cefepime >>   Microbiology results: 9/26 BCx: Pending  Thank you for allowing pharmacy to be a part of this patient's care.  Renda Rolls, PharmD, [facility] 03/20/2021 3:25 AM

## 2021-03-21 ENCOUNTER — Other Ambulatory Visit: Payer: Self-pay | Admitting: *Deleted

## 2021-03-21 ENCOUNTER — Encounter: Payer: Self-pay | Admitting: Otolaryngology

## 2021-03-21 DIAGNOSIS — J0100 Acute maxillary sinusitis, unspecified: Secondary | ICD-10-CM | POA: Diagnosis not present

## 2021-03-21 LAB — CBC
HCT: 32.3 % — ABNORMAL LOW (ref 39.0–52.0)
Hemoglobin: 11.5 g/dL — ABNORMAL LOW (ref 13.0–17.0)
MCH: 35.6 pg — ABNORMAL HIGH (ref 26.0–34.0)
MCHC: 35.6 g/dL (ref 30.0–36.0)
MCV: 100 fL (ref 80.0–100.0)
Platelets: 284 10*3/uL (ref 150–400)
RBC: 3.23 MIL/uL — ABNORMAL LOW (ref 4.22–5.81)
RDW: 12.4 % (ref 11.5–15.5)
WBC: 17.8 10*3/uL — ABNORMAL HIGH (ref 4.0–10.5)
nRBC: 0 % (ref 0.0–0.2)

## 2021-03-21 LAB — GLUCOSE, CAPILLARY
Glucose-Capillary: 154 mg/dL — ABNORMAL HIGH (ref 70–99)
Glucose-Capillary: 171 mg/dL — ABNORMAL HIGH (ref 70–99)
Glucose-Capillary: 196 mg/dL — ABNORMAL HIGH (ref 70–99)
Glucose-Capillary: 214 mg/dL — ABNORMAL HIGH (ref 70–99)

## 2021-03-21 LAB — BASIC METABOLIC PANEL
Anion gap: 11 (ref 5–15)
BUN: 22 mg/dL (ref 8–23)
CO2: 23 mmol/L (ref 22–32)
Calcium: 9.2 mg/dL (ref 8.9–10.3)
Chloride: 97 mmol/L — ABNORMAL LOW (ref 98–111)
Creatinine, Ser: 0.85 mg/dL (ref 0.61–1.24)
GFR, Estimated: >60 mL/min (ref >60)
Glucose, Bld: 186 mg/dL — ABNORMAL HIGH (ref 70–99)
Potassium: 4.3 mmol/L (ref 3.5–5.1)
Sodium: 131 mmol/L — ABNORMAL LOW (ref 135–145)

## 2021-03-21 LAB — LEGIONELLA PNEUMOPHILA SEROGP 1 UR AG: L. pneumophila Serogp 1 Ur Ag: NEGATIVE

## 2021-03-21 MED ORDER — LIVING WELL WITH DIABETES BOOK
Freq: Once | Status: AC
  Filled 2021-03-21: qty 1

## 2021-03-21 NOTE — Progress Notes (Signed)
PROGRESS NOTE    Daniel Holden  YHC:[id number] DOB: 06-Aug-1945 DOA: 03/19/2021 PCP: Daniel Athens, MD     Brief Narrative:  Daniel Holden is a 75 y.o. male with medical history significant for glucose intolerance, A1c of 6.4 in July 2021, hypertension, hyperlipidemia, obstructive sleep apnea, CAD, on CPAP nightly, pulmonary emphysema, obesity class I, anxiety, diabetic neuropathy, GERD, insomnia, who presents emergency department for chief concerns of headache and right-sided facial numbness. Work up with CT and MRI revealed opacification of right maxillary sinus, right periorbital cellulitis.  Case was discussed with ophthalmology who recommended IV antibiotics and MRI orbits.  They recommended ENT consultation for debridement.  ENT was consulted.  Numerous providers have called [name], [facility] for transfer.  [facility] and [facility] have declined to accept patient.  [facility] accepted but there were no beds available 9/25 or 9/26.  Patient was ultimately taken to the OR by Dr. Richardson Landry on 9/26.  Infectious disease was consulted for antibiotic recommendations.  New events last 24 hours / Subjective: Patient sitting in bed, complains of some blurry vision in his right eye but overall discomfort has improved, numbness is resolving.  Wants to take a shower and walk around the unit today.  He did have a brief reaction after Flagyl administration last night.  He describes it as right arm and chest discomfort with flushing and sweating.  The IV infusion was on the right arm.  This resolved after about an hour.  Assessment & Plan:   Principal Problem:   Maxillary sinusitis Active Problems:   OSA on CPAP   Anxiety   Class 1 obesity due to excess calories with serious comorbidity and body mass index (BMI) of 32.0 to 32.9 in adult   Neuropathy   Primary hypertension   Hepatic steatosis   Periorbital cellulitis   Right maxillary sinusitis, orbital cellulitis -MRI orbits: The right maxillary sinus  is completely opacified with complex material. 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 Associated thinning and dehiscence at the right orbital floor with inflammatory changes and phlegmon involving the inferior postseptal bony right orbit. Right lamina papyracea thinned and possibly dehisced as well. No discrete intraorbital fluid collection or abscess. Associated mild right-sided proptosis. Additional thinning and dehiscence elsewhere about the right maxillary sinus, with evidence for invasion into the right retro antral fat. Mild soft tissue swelling about the right periorbital soft tissues, suggesting associated periorbital cellulitis. -Status post right endoscopic maxillary antrostomy with tissue removal, right anterior ethmoidectomy by Dr. Richardson Landry 9/26. Did not see invasive hyphae in frozen sections  -Tissue biopsies pending. Treat as bacterial infection superimposed on allergic non-invasive fungal sinusitis   -Continue Cefepime, ID following.  Holding Flagyl today -Discussed with ophthalmology for right eye discomfort, recommended for erythromycin ointment 2-3 times daily for couple of days, follow up with [facility] after discharge   Diabetes mellitus, well controlled, new diagnosis -Hemoglobin A1c 6.6 -Consult diabetic coordinator -[facility]I  -Likely dc on oral agent    OSA -Hold off on CPAP use for several weeks  HTN -Continue Norvasc, lotensin, lopressor    HLD -Continue Lipitor  CAD -Continue Ranexa  -Aspirin on hold   Anxiety  -Continue Xanax    DVT prophylaxis:  Place TED hose Start: 03/19/21 1959  Code Status:     Code Status Orders  (  From admission, onward)           Start     Ordered   03/19/21 1959  Full code   Continuous        03/19/21 2002           Code Status History     Date Active Date Inactive Code Status Order ID Comments User Context   10/04/2020 1026 10/04/2020 1913 Full Code [id number]  Leonie Man, MD Inpatient      Advance Directive Documentation    Flowsheet Row Most Recent Value  Type of Advance Directive Healthcare Power of Attorney  Pre-existing out of facility DNR order (yellow form or pink MOST form) --  "MOST" Form in Place? --      Family Communication: At bedside Disposition Plan:  Status is: Inpatient  Remains inpatient appropriate because:Inpatient level of care appropriate due to severity of illness  Dispo: The patient is from: Home              Anticipated d/c is to: Home              Patient currently is not medically stable to d/c.   Difficult to place patient No      Consultants:  ENT  ID   Procedures:  Status post right endoscopic maxillary antrostomy with tissue removal, right anterior ethmoidectomy by Dr. Richardson Landry 9/26  Antimicrobials:  Anti-infectives (From admission, onward)    Start     Dose/Rate Route Frequency Ordered Stop   03/20/21 2000  metroNIDAZOLE (FLAGYL) IVPB 500 mg        500 mg 100 mL/hr over 60 Minutes Intravenous Every 12 hours 03/20/21 1903     03/20/21 0400  fluconazole (DIFLUCAN) IVPB 200 mg  Status:  Discontinued        200 mg 100 mL/hr over 60 Minutes Intravenous Daily 03/20/21 0243 03/20/21 1215   03/20/21 0300  ceFEPIme (MAXIPIME) 2 g in sodium chloride 0.9 % 100 mL IVPB        2 g 200 mL/hr over 30 Minutes Intravenous Every 8 hours 03/20/21 0139     03/20/21 0030  vancomycin (VANCOREADY) IVPB 2000 mg/400 mL  Status:  Discontinued        2,000 mg 200 mL/hr over 120 Minutes Intravenous  Once 03/20/21 0024 03/20/21 0126   03/20/21 0000  Ampicillin-Sulbactam (UNASYN) 3 g in sodium chloride 0.9 % 100 mL IVPB  Status:  Discontinued        3 g 200 mL/hr over 30 Minutes Intravenous Every 6 hours 03/19/21 2001  03/20/21 0137   03/19/21 1845  Ampicillin-Sulbactam (UNASYN) 3 g in sodium chloride 0.9 % 100 mL IVPB        3 g 200 mL/hr over 30 Minutes Intravenous  Once 03/19/21 1830 03/19/21 1854        Objective: Vitals:   03/20/21 1518 03/20/21 1946 03/21/21 0309 03/21/21 0808  BP: 128/72 129/65 128/63 110/62  Pulse: 87 88 80 63  Resp:  18 16 19   Temp:  97.9 F (36.6 C)  97.7 F (36.5 C)  TempSrc:  Oral  Oral  SpO2: 93% 96% 96% 99%  Weight:      Height:        Intake/Output Summary (Last 24 hours) at 03/21/2021 1020 Last data filed at 03/20/2021 1800 Gross per 24 hour  Intake 417.35 ml  Output --  Net 417.35 ml    Filed Weights   03/19/21 1544 03/20/21 0318 03/20/21 5643  Weight: 88.5 kg 89.8 kg 89.8 kg    Examination: General exam: Appears calm and comfortable  Respiratory system: Clear to auscultation. Respiratory effort normal. Cardiovascular system: S1 & S2 heard, RRR. No pedal edema. Gastrointestinal system: Abdomen is nondistended, soft and nontender. Normal bowel sounds heard. Central nervous system: Alert and oriented. Non focal exam. Speech clear  Extremities: Symmetric in appearance bilaterally  Skin: No rashes, lesions or ulcers on exposed skin  Psychiatry: Judgement and insight appear stable. Mood & affect appropriate.    Data Reviewed: I have personally reviewed following labs and imaging studies  CBC: Recent Labs  Lab 03/19/21 1544 03/20/21 0403 03/21/21 0621  WBC 12.4* 9.2 17.8*  NEUTROABS 9.0*  --   --   HGB 12.4* 11.6* 11.5*  HCT 34.1* 31.8* 32.3*  MCV 97.4 99.1 100.0  PLT 292 244 188    Basic Metabolic Panel: Recent Labs  Lab 03/19/21 1544 03/20/21 0403 03/21/21 0621  NA 123* 128* 131*  K 4.3 4.3 4.3  CL 88* 95* 97*  CO2 24 26 23   GLUCOSE 179* 150* 186*  BUN 21 16 22   CREATININE 0.88 0.89 0.85  CALCIUM 9.6 9.2 9.2    GFR: Estimated Creatinine Clearance: 82.4 mL/min (by C-G formula based on SCr of 0.85 mg/dL). Liver Function  Tests: Recent Labs  Lab 03/19/21 1544  AST 14*  ALT 7  ALKPHOS 66  BILITOT 1.4*  PROT 7.9  ALBUMIN 4.2    No results for input(s): LIPASE, AMYLASE in the last 168 hours. No results for input(s): AMMONIA in the last 168 hours. Coagulation Profile: No results for input(s): INR, PROTIME in the last 168 hours. Cardiac Enzymes: No results for input(s): CKTOTAL, CKMB, CKMBINDEX, TROPONINI in the last 168 hours. BNP (last 3 results) No results for input(s): PROBNP in the last 8760 hours. HbA1C: Recent Labs    03/19/21 1544  HGBA1C 6.6*   CBG: Recent Labs  Lab 03/20/21 0927 03/20/21 1131 03/20/21 1639 03/20/21 2123 03/21/21 0810  GLUCAP 152* 179* 203* 224* 154*    Lipid Profile: No results for input(s): CHOL, HDL, LDLCALC, TRIG, CHOLHDL, LDLDIRECT in the last 72 hours. Thyroid Function Tests: No results for input(s): TSH, T4TOTAL, FREET4, T3FREE, THYROIDAB in the last 72 hours. Anemia Panel: Recent Labs    03/20/21 0403  FERRITIN 234    Sepsis Labs: Recent Labs  Lab 03/19/21 1544  PROCALCITON <0.10     Recent Results (from the past 240 hour(s))  MRSA Next Gen by PCR, Nasal     Status: None   Collection Time: 03/19/21  9:54 PM   Specimen: Nasopharyngeal Swab; Nasal Swab  Result Value Ref Range Status   MRSA by PCR Next Gen NOT DETECTED NOT DETECTED Final    Comment: (NOTE) The GeneXpert MRSA Assay (FDA approved for NASAL specimens only), is one component of a comprehensive MRSA colonization surveillance program. It is not intended to diagnose MRSA infection nor to guide or monitor treatment for MRSA infections. Test performance is not FDA approved in patients less than 53 years old. Performed at [facility], [address] [place], [place] [address]   Resp Panel by RT-PCR (Flu A&B, Covid) Nasopharyngeal Swab     Status: None   Collection Time: 03/19/21 10:18 PM   Specimen: Nasopharyngeal Swab; Nasopharyngeal(NP) swabs in vial transport  medium  Result Value Ref Range Status   SARS Coronavirus 2 by RT PCR NEGATIVE NEGATIVE Final    Comment: (NOTE) SARS-CoV-2 target nucleic acids are NOT  DETECTED.  The SARS-CoV-2 RNA is generally detectable in upper respiratory specimens during the acute phase of infection. The lowest concentration of SARS-CoV-2 viral copies this assay can detect is 138 copies/mL. A negative result does not preclude SARS-Cov-2 infection and should not be used as the sole basis for treatment or other patient management decisions. A negative result may occur with  improper specimen collection/handling, submission of specimen other than nasopharyngeal swab, presence of viral mutation(s) within the areas targeted by this assay, and inadequate number of viral copies(<138 copies/mL). A negative result must be combined with clinical observations, patient history, and epidemiological information. The expected result is Negative.  Fact Sheet for Patients:  EntrepreneurPulse.com.au  Fact Sheet for Healthcare Providers:  IncredibleEmployment.be  This test is no t yet approved or cleared by the [place] FDA and  has been authorized for detection and/or diagnosis of SARS-CoV-2 by FDA under an Emergency Use Authorization (EUA). This EUA will remain  in effect (meaning this test can be used) for the duration of the COVID-19 declaration under Section 564(b)(1) of the Act, 21 U.S.C.section 360bbb-3(b)(1), unless the authorization is terminated  or revoked sooner.       Influenza A by PCR NEGATIVE NEGATIVE Final   Influenza B by PCR NEGATIVE NEGATIVE Final    Comment: (NOTE) The Xpert Xpress SARS-CoV-2/FLU/RSV plus assay is intended as an aid in the diagnosis of influenza from Nasopharyngeal swab specimens and should not be used as a sole basis for treatment. Nasal washings and aspirates are unacceptable for Xpert Xpress SARS-CoV-2/FLU/RSV testing.  Fact Sheet for  Patients: EntrepreneurPulse.com.au  Fact Sheet for Healthcare Providers: IncredibleEmployment.be  This test is not yet approved or cleared by the [place] FDA and has been authorized for detection and/or diagnosis of SARS-CoV-2 by FDA under an Emergency Use Authorization (EUA). This EUA will remain in effect (meaning this test can be used) for the duration of the COVID-19 declaration under Section 564(b)(1) of the Act, 21 U.S.C. section 360bbb-3(b)(1), unless the authorization is terminated or revoked.  Performed at [facility], [address] [place], [place] [address]   CULTURE, BLOOD (ROUTINE X 2) w Reflex to ID Panel     Status: None (Preliminary result)   Collection Time: 03/20/21 12:58 AM   Specimen: BLOOD  Result Value Ref Range Status   Specimen Description BLOOD LEFT ARM  Final   Special Requests   Final    BOTTLES DRAWN AEROBIC AND ANAEROBIC Blood Culture adequate volume   Culture   Final    NO GROWTH 1 DAY Performed at [facility], [address], [place], [place] [address]    Report Status PENDING  Incomplete  CULTURE, BLOOD (ROUTINE X 2) w Reflex to ID Panel     Status: None (Preliminary result)   Collection Time: 03/20/21 12:58 AM   Specimen: BLOOD  Result Value Ref Range Status   Specimen Description BLOOD LEFT HAND  Final   Special Requests   Final    BOTTLES DRAWN AEROBIC AND ANAEROBIC Blood Culture adequate volume   Culture   Final    NO GROWTH 1 DAY Performed at [facility], [address], [place], [place] [address]    Report Status PENDING  Incomplete  Aerobic/Anaerobic Culture w Gram Stain (surgical/deep wound)     Status: None (Preliminary result)   Collection Time: 03/20/21  8:14 AM   Specimen: PATH Sinus Contents/Nasal Polyps  Result Value Ref Range Status   Specimen Description   Final  MAXILLARY SINUS Performed at [facility], [address]  [place], [place] [address]    Special Requests   Final    NONE Performed at [facility], [place], [place] [address]    Gram Stain   Final    NO ORGANISMS SEEN SQUAMOUS EPITHELIAL CELLS PRESENT FEW WBC PRESENT, PREDOMINANTLY MONONUCLEAR FEW GRAM POSITIVE COCCI MODERATE HYPHAL ELEMENTS SEEN Performed at [facility], [place] [address], [place], [place] [address]    Culture PENDING  Incomplete   Report Status PENDING  Incomplete       Radiology Studies: CT HEAD WO CONTRAST  Result Date: 03/19/2021 CLINICAL DATA:  headache EXAM: CT HEAD WITHOUT CONTRAST TECHNIQUE: Contiguous axial images were obtained from the base of the skull through the vertex without intravenous contrast. COMPARISON:  None. FINDINGS: Brain: No evidence of acute infarction, hemorrhage, hydrocephalus, extra-axial collection or mass lesion/mass effect. Vascular: No hyperdense vessel or unexpected calcification. Skull: Normal. Negative for fracture or focal lesion. Sinuses/Orbits: There is complete opacification of the RIGHT maxillary sinus in the majority of the RIGHT-sided ethmoid air cells extending into the hypoplastic RIGHT frontal sinus. There is fat stranding noted along the inferior orbital rim along the maxillary sinus extending into the inferior orbit (series 4, image 16). No definitive intracranial extension is seen, but evaluation is limited. Mild mucosal thickening of the LEFT maxillary sinus. Other: None IMPRESSION: 1. There is complete opacification of the RIGHT maxillary sinus extending into the RIGHT ethmoid air cells and the hypoplastic RIGHT frontal sinus. There is asymmetric fat stranding noted in the inferior RIGHT orbit. This could reflect a periosteal abscess. As such, recommend further dedicated orbit evaluation with CT or MRI with contrast. Electronically Signed   By: Valentino Saxon M.D.   On: 03/19/2021 18:25   CT MAXILLOFACIAL WO CONTRAST  Result Date: 03/19/2021 CLINICAL  DATA:  Initial evaluation for concern of invasive right maxillary sinusitis with right orbital involvement. EXAM: CT MAXILLOFACIAL WITHOUT CONTRAST TECHNIQUE: Multidetector CT imaging of the maxillofacial structures was performed. Multiplanar CT image reconstructions were also generated. COMPARISON:  Comparison made with previous head CT and orbital MRI from 03/19/2021. FINDINGS: Orbits/Sinuses: Right maxillary sinus is completely opacified with complex heterogeneous material. The right ethmoidal air cells are also largely opacified. Opacification of the hypoplastic right frontal sinus and frontoethmoidal recess. Mucosal thickening within the right sphenoid sinus. Thin section bone window algorithm was performed through the sinuses. Images demonstrate multifocal areas of osseous thinning and dehiscence about the right orbital floor (series 6, image 54), confirming findings seen on prior studies. Additionally, the anterior and posterior walls of the right maxillary sinus are also thin with scattered areas of dehiscence (series 3, image 58). Hazy inflammatory stranding seen within the right retro antral fat, consistent with invasive sinusitis (series 2, image 53). The right pre maxillary soft tissues are relatively preserved at this time. Stranding with inflammatory changes seen involving the extraconal fat of the inferior right orbit, again concerning for postseptal cellulitis related to invasive sinusitis (series 5, image 30). Mild mass effect on the adjacent right inferior rectus muscle which is slightly bowed superiorly (series 5, image 31). Additionally, there is possible thinning and dehiscence at the inferior right lamina papyracea (series 6, image 43). The right lamina papyracea is slightly bowed laterally into the bony right orbit due to underlying sinus disease (series 2, image 92). No discrete abscess or drainable fluid collection seen on this noncontrast CT. Remainder of the right orbital fat  is relatively  maintained at this time. Mild right-sided proptosis again noted. Right globe and optic nerve otherwise unremarkable. Extra-ocular muscles otherwise unremarkable. Lacrimal gland normal. No other abnormality about the right lacrimal apparatus. Contralateral left globe and orbit within normal limits. Other than mild mucosal thickening about the left maxillary sinus, the remainder of the left-sided paranasal sinuses are otherwise relatively clear without abnormality. Mastoid air cells and middle ear cavities are well pneumatized and free of fluid. Soft tissues: Probable mild soft tissue swelling about the right periorbital soft tissues, suggesting possible mild periorbital cellulitis. No discrete soft tissue abscess or collection. Limited intracranial: Unremarkable. No visible evidence for intracranial spread of infection at this time. IMPRESSION: 1. 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 Associated thinning and dehiscence at the right orbital floor with inflammatory changes and phlegmon involving the inferior postseptal bony right orbit. Right lamina papyracea thinned and possibly dehisced as well. No discrete intraorbital fluid collection or abscess. Associated mild right-sided proptosis. Additional thinning and dehiscence elsewhere about the right maxillary sinus, with evidence for invasion into the right retro antral fat. 2. Mild soft tissue swelling about the right periorbital soft tissues, suggesting associated periorbital cellulitis. Critical results discussed by telephone at the time of interpretation on 03/19/2021 at 10:49 pm to provider AMY COX , who verbally acknowledged these results. Per Dr. Tobie Poet, emergent ENT consultation has already been obtained. Electronically Signed   By: Jeannine Boga M.D.   On: 03/19/2021 23:01   MR ORBITS W WO CONTRAST  Result  Date: 03/19/2021 CLINICAL DATA:  Pain in right eye. Additional history provided: Patient reports headache on right side of head for the past 2-3 weeks, right-sided facial numbness, occasional blurry vision. EXAM: MRI OF THE ORBITS WITHOUT AND WITH CONTRAST TECHNIQUE: Multiplanar, multi-echo pulse sequences of the orbits and surrounding structures were acquired including fat saturation techniques, before and after intravenous contrast administration. CONTRAST:  7.52mL GADAVIST GADOBUTROL 1 MMOL/ML IV SOLN COMPARISON:  Head CT 03/19/2021. FINDINGS: Orbits: Osseous thinning with foci of possible dehiscence within the right orbital floor/roof of right maxillary sinus were better appreciated on the head CT performed earlier today. There is adjacent abnormal T2 hyperintense enhancing soft tissue within the inferior extraconal right orbit measuring up to 5 mm in thickness (for instance as seen on series 13, image 29) (series 7, image 26). This is highly concerning for extension of infection from the right maxillary sinus with resultant postseptal orbital cellulitis/phlegmon at this site. This abnormal soft tissue abuts the right inferior rectus muscle. The globes are normal in size and contour. The extraocular muscles and optic nerve sheath complexes are otherwise symmetric and unremarkable. Subtle right proptosis. Visualized sinuses: The right maxillary sinus is completely opacified with complex material. Osseous thinning with foci of possible dehiscence in the right orbital floor/roof of right maxillary sinus, better appreciated on the head CT performed earlier today. Near complete T2 hyperintense opacification of the anterior right ethmoid air cells. Trace mucosal thickening elsewhere within the ethmoid sinuses, and within the visualized left maxillary sinus. Soft tissues: T2 hyperintense signal abnormality and enhancement within the right periorbital soft tissues. Limited intracranial: No evidence of acute intracranial  abnormality within the field of view. These results were called by telephone at the time of interpretation on 03/19/2021 at 8:25 pm to provider Dr. Joni Fears, who verbally acknowledged these results. IMPRESSION: The right maxillary sinus is completely opacified with complex material.  Associated osseous thinning and possible foci of dehiscence in the right orbital floor/roof of maxillary sinus, better appreciated on the head CT performed earlier today. There is adjacent abnormal enhancing soft tissue within the inferior extraconal right orbit, measuring up to 5 mm in thickness. This is favored to reflect extension of infection from the right maxillary sinus with resultant right post-septal orbital cellulitis/phlegmon. This abnormal enhancing soft tissue abuts the right inferior rectus muscle. Subtle right proptosis. Signal abnormality and enhancement within the right periorbital soft tissues, likely reflecting a component of periorbital cellulitis. Additional paranasal sinus disease, as described. Most notably, there is severe partial opacification of the anterior right ethmoid air cells (overall right ostiomeatal unit obstructive pattern). ENT and ophthalmology consultation recommended. Electronically Signed   By: Kellie Simmering D.O.   On: 03/19/2021 20:31      Scheduled Meds:  amLODipine  5 mg Oral Daily   atorvastatin  80 mg Oral Daily   benazepril  20 mg Oral Daily   cholecalciferol  5,000 Units Oral Daily   erythromycin  1 application Right Eye P8E   fluticasone  2 spray Each Nare Daily   gabapentin  300 mg Oral BID   insulin aspart  0-5 Units Subcutaneous QHS   insulin aspart  0-9 Units Subcutaneous TID WC   melatonin  10 mg Oral QHS   metoprolol tartrate  50 mg Oral BID   pantoprazole  40 mg Oral Q breakfast   polyethylene glycol  17 g Oral Daily   ranolazine  1,000 mg Oral BID   sodium chloride  4 spray Nasal QID   cyanocobalamin  1,000 mcg Oral Daily   Continuous Infusions:  sodium  chloride 0 mL/hr at 03/20/21 0651   ceFEPime (MAXIPIME) IV 2 g (03/21/21 0615)   metronidazole Stopped (03/21/21 4235)     LOS: 1 day      Time spent: 25 minutes   Dessa Phi, DO [facility] 03/21/2021, 10:20 AM   Available via Epic secure chat 7am-7pm After these hours, please refer to coverage provider listed on [email or web address]

## 2021-03-21 NOTE — Progress Notes (Signed)
Inpatient Diabetes Program Recommendations  AACE/ADA: New Consensus Statement on Inpatient Glycemic Control (2015)  Target Ranges:  Prepandial:   less than 140 mg/dL      Peak postprandial:   less than 180 mg/dL (1-2 hours)      Critically ill patients:  140 - 180 mg/dL   Results for STORMY, SABOL (MRN [id number]) as of 03/21/2021 09:30  Ref. Range 03/20/2021 07:20 03/20/2021 09:27 03/20/2021 11:31 03/20/2021 16:39 03/20/2021 21:23 03/21/2021 08:10  Glucose-Capillary Latest Ref Range: 70 - 99 mg/dL 142 (H)     Decadron 10 mg @ 7:47 152 (H) 179 (H)  Novolog 2 units 203 (H)  Novolog 3 units 224 (H)  Novolog 2 units 154 (H)  Novolog 2 units   Results for MARTINE, TRAGESER (MRN [id number]) as of 03/21/2021 09:30  Ref. Range 03/19/2021 15:44  Hemoglobin A1C Latest Ref Range: 4.8 - 5.6 % 6.6 (H)   Review of Glycemic Control  Diabetes history: Glucose intolerance (per H&P) Outpatient Diabetes medications: NA Current orders for Inpatient glycemic control: Novolog 0-9 units TID with meals, Novolog 0-5 units QHS  Inpatient Diabetes Program Recommendations:    HbgA1C:  A1C 6.6% on 03/19/21 indicating an average glucose of 143 mg/dl over the past 2-3 months. Prior A1C in Care Everywhere 6.4% on 01/21/2020. Per ADA, if A1C is 6.5% or higher it meets criteria to dx with DM. MD, if patient will be dx with new onset DM at this time, please inform patient and nursing staff and consult diabetes coordinator.  Thanks, Barnie Alderman, RN, MSN, CDE Diabetes Coordinator Inpatient Diabetes Program [phone] (Team Pager from 8am to 5pm)

## 2021-03-21 NOTE — Progress Notes (Signed)
Patient alerts nurse that he feels hot after receiving new abx therapy. Upon entering the room, this nurse notes the patient's face neck and chest to be flushed.No rash noted or itching endorsed by the patient. Bed assessment did not note any additional acute distress. Patient verbally denies any SOB, tightness in the chest, but does endorse a dull ache in his chest. Vitals are take and WNL. Patient is given a cup of ice water to assist with cooling. Will monitor for any possible adverse reactions.

## 2021-03-21 NOTE — Progress Notes (Signed)
ID Pt doing better Had a shower No pain rt eye No headache  Patient Vitals for the past 24 hrs:  BP Temp Temp src Pulse Resp SpO2  03/21/21 1522 120/64 97.6 F (36.4 C) Oral 62 18 98 %  03/21/21 0808 110/62 97.7 F (36.5 C) Oral 63 19 99 %  03/21/21 0309 128/63 -- -- 80 16 96 %  03/20/21 1946 129/65 97.9 F (36.6 C) Oral 88 18 96 %    Rt eye lower lid swelling better Full range of movt Minimal numbness rt alanasi Chest CTA Hss1s2 Abd soft CNS non focal  Labs CBC Latest Ref Rng & Units 03/21/2021 03/20/2021 03/19/2021  WBC 4.0 - 10.5 K/uL 17.8(H) 9.2 12.4(H)  Hemoglobin 13.0 - 17.0 g/dL 11.5(L) 11.6(L) 12.4(L)  Hematocrit 39.0 - 52.0 % 32.3(L) 31.8(L) 34.1(L)  Platelets 150 - 400 K/uL 284 244 292    CMP Latest Ref Rng & Units 03/21/2021 03/20/2021 03/19/2021  Glucose 70 - 99 mg/dL 186(H) 150(H) 179(H)  BUN 8 - 23 mg/dL 22 16 21   Creatinine 0.61 - 1.24 mg/dL 0.85 0.89 0.88  Sodium 135 - 145 mmol/L 131(L) 128(L) 123(L)  Potassium 3.5 - 5.1 mmol/L 4.3 4.3 4.3  Chloride 98 - 111 mmol/L 97(L) 95(L) 88(L)  CO2 22 - 32 mmol/L 23 26 24   Calcium 8.9 - 10.3 mg/dL 9.2 9.2 9.6  Total Protein 6.5 - 8.1 g/dL - - 7.9  Total Bilirubin 0.3 - 1.2 mg/dL - - 1.4(H)  Alkaline Phos 38 - 126 U/L - - 66  AST 15 - 41 U/L - - 14(L)  ALT 0 - 44 U/L - - 7     Culture- staph aureus  Impression/recommendation Extensive rt sided pansinusitis with periorbital cellulitis ? S/p rt maxillary antrostomy, with tissue removal, rt anterior ethmoidectomy?  Fungal rhinosinusitis was a concern on the imaging but frozen section of the sinus mucosa taken during surgery today  did not show any hyphae- On cefepime for bacterial infection.  Flagyl was stopped as he had a reaction ( flushing, pain arms) MRSA nares negative  Fungal hyphae seen in the aerobic culture- discussed with Dr.Bennett- as this is colonization no need for treatment 'awaiting pathology to see whether there is any hyphae      Pre diabetes    OSA   HTn   ?Staph aureus in sinus culture- await susceptibility to choose oral antibiotic  Discussed with aptient and his wife and Dr.Bennett

## 2021-03-21 NOTE — Progress Notes (Signed)
Patient ID: Daniel Holden, male   DOB: 1945-10-14, 75 y.o.   MRN: [id number]  Notified attending of reaction to last night's Flagyl per RN report. MD order to hold Flagyl at this time.   Haydee Salter, RN

## 2021-03-21 NOTE — Progress Notes (Addendum)
Inpatient Diabetes Program Recommendations  AACE/ADA: New Consensus Statement on Inpatient Glycemic Control (2015)  Target Ranges:  Prepandial:   less than 140 mg/dL      Peak postprandial:   less than 180 mg/dL (1-2 hours)      Critically ill patients:  140 - 180 mg/dL  Results for Daniel Holden, Daniel Holden (MRN [id number]) as of 03/21/2021 10:43  Ref. Range 03/20/2021 07:20 03/20/2021 09:27 03/20/2021 11:31 03/20/2021 16:39 03/20/2021 21:23  Glucose-Capillary Latest Ref Range: 70 - 99 mg/dL 142 (H)    10 mg Decadron @0747  152 (H) 179 (H)  2 units Novolog  203 (H)  3 units Novolog  224 (H)  2 units Novolog   Results for Daniel Holden, Daniel Holden (MRN [id number]) as of 03/21/2021 10:43  Ref. Range 03/21/2021 08:10  Glucose-Capillary Latest Ref Range: 70 - 99 mg/dL 154 (H)  2 units Novolog   Results for Daniel Holden, Daniel Holden (MRN [id number]) as of 03/21/2021 10:43  Ref. Range 03/19/2021 15:44  Hemoglobin A1C Latest Ref Range: 4.8 - 5.6 % 6.6 (H)  (143 mg/dl)    Admit with:  Right maxillary sinusitis, orbital cellulitis New diagnosis Diabetes    Current Orders: Novolog Sensitive Correction Scale/ SSI (0-9 units) TID AC + HS    CBGs elevated last PM likely due to Decadron given yesterday AM for surgery  CBG down to 154 this AM  Will order educational materials and RD consult and plan to speak with pt about new diabetes diagnosis   Addendum 11:30am--Met w/ pt and wife at bedside to discuss new diabetes diagnosis.  Pt told me he has been working with his PCP to try to prevent his pre-diabetes from progressing.  Lost 40 pounds several years ago and got his A1c down to 5.5%.  Has gained back about 20 pounds of the weight lost.  Has good working knowledge about diabetes through previous research he has done.  We discussed how his current sinus infection issues may have affected his A1c as well.  Encouraged pt to follow up with his PCP after d/c to get his A1c retested in the next 3-6 months.  Discussed  A1C results with pt and wife and explained what an A1C is, basic pathophysiology of DM Type 2, basic home care, basic diabetes diet nutrition principles, importance of checking CBGs and maintaining good CBG control to prevent long-term and short-term complications.  Reviewed signs and symptoms of hyperglycemia and hypoglycemia and how to treat hypoglycemia at home.  Also reviewed blood sugar goals and A1c goals for home.  Provided pt and wife with the Living well with diabetes book and placed RD consult for DM diet education for this patient.  Discussed with pt that [facility]--Pt declined for now but knows to ask his PCP for future referral if desired.  Already has CBG meter at home and knows how to use--Asked pt to check his CBGs at least once daily either before a meal or 2 hours after a meal.    Also discussed with pt that Attending MD may choose to send pt home on oral DM medication.  Reviewed several of the most ordered DM meds that MD could order and how to take.     --Will follow patient during hospitalization--  Wyn Quaker RN, MSN, CDE Diabetes Coordinator Inpatient Glycemic Control Team Team Pager: [phone] (8a-5p)

## 2021-03-21 NOTE — Progress Notes (Signed)
Daniel, Holden [id number] Feb 19, 1946 Daniel Nearing, MD   SUBJECTIVE: This 75 y.o. year old male is status post right maxillary antrostomy and ethmoidectomy with sinus debridement.  Patient reports that he slept well last night.  Vision seems to be getting back to normal in the right eye with no reported double vision.  Eye pain is resolved.  He was able to irrigate with saline and got some old blood out.  He remains afebrile Medications:  Current Facility-Administered Medications  Medication Dose Route Frequency Provider Last Rate Last Admin   0.9 %  sodium chloride infusion   Intravenous PRN Cox, Amy N, DO 0 mL/hr at 03/20/21 0651 New Bag at 03/20/21 1093   acetaminophen (TYLENOL) tablet 650 mg  650 mg Oral Q6H PRN Cox, Amy N, DO   650 mg at 03/21/21 2355   Or   acetaminophen (TYLENOL) suppository 650 mg  650 mg Rectal Q6H PRN Cox, Amy N, DO       ALPRAZolam Duanne Moron) tablet 0.25 mg  0.25 mg Oral QHS PRN Cox, Amy N, DO   0.25 mg at 03/20/21 2300   amLODipine (NORVASC) tablet 5 mg  5 mg Oral Daily Cox, Amy N, DO   5 mg at 03/21/21 0831   atorvastatin (LIPITOR) tablet 80 mg  80 mg Oral Daily Dessa Phi, DO   80 mg at 03/20/21 1727   benazepril (LOTENSIN) tablet 20 mg  20 mg Oral Daily Cox, Amy N, DO   20 mg at 03/21/21 0835   ceFEPIme (MAXIPIME) 2 g in sodium chloride 0.9 % 100 mL IVPB  2 g Intravenous Q8H Renda Rolls, RPH 200 mL/hr at 03/21/21 0615 2 g at 03/21/21 0615   cholecalciferol (VITAMIN D) tablet 5,000 Units  5,000 Units Oral Daily Cox, Amy N, DO   5,000 Units at 03/21/21 7322   docusate sodium (COLACE) capsule 100 mg  100 mg Oral BID PRN Dessa Phi, DO       erythromycin ophthalmic ointment 1 application  1 application Right Eye G2R Dessa Phi, DO   1 application at 42/70/62 0616   fluticasone (FLONASE) 50 MCG/ACT nasal spray 2 spray  2 spray Each Nare Daily Cox, Amy N, DO       gabapentin (NEURONTIN) capsule 300 mg  300 mg Oral BID Cox, Amy N, DO   300 mg at 03/21/21  3762   insulin aspart (novoLOG) injection 0-5 Units  0-5 Units Subcutaneous QHS Cox, Amy N, DO   2 Units at 03/20/21 2212   insulin aspart (novoLOG) injection 0-9 Units  0-9 Units Subcutaneous TID WC Cox, Amy N, DO   2 Units at 03/21/21 1253   melatonin tablet 10 mg  10 mg Oral QHS Cox, Amy N, DO   10 mg at 03/20/21 2211   metoprolol tartrate (LOPRESSOR) tablet 50 mg  50 mg Oral BID Cox, Amy N, DO   50 mg at 03/21/21 0831   ondansetron (ZOFRAN) tablet 4 mg  4 mg Oral Q6H PRN Cox, Amy N, DO       Or   ondansetron (ZOFRAN) injection 4 mg  4 mg Intravenous Q6H PRN Cox, Amy N, DO       pantoprazole (PROTONIX) EC tablet 40 mg  40 mg Oral Q breakfast Renda Rolls, RPH   40 mg at 03/21/21 0831   polyethylene glycol (MIRALAX / GLYCOLAX) packet 17 g  17 g Oral Daily Dessa Phi, DO   17 g at 03/21/21 [phone]  ranolazine (RANEXA) 12 hr tablet 1,000 mg  1,000 mg Oral BID Cox, Amy N, DO   1,000 mg at 03/21/21 0835   sodium chloride (OCEAN) 0.65 % nasal spray 4 spray  4 spray Nasal QID Clyde Canterbury, MD   4 spray at 03/21/21 0841   vitamin B-12 (CYANOCOBALAMIN) tablet 1,000 mcg  1,000 mcg Oral Daily Cox, Amy N, DO   1,000 mcg at 03/21/21 3007  .  Medications Prior to Admission  Medication Sig Dispense Refill   ALPRAZolam (XANAX) 0.25 MG tablet Take 1 tablet (0.25 mg total) by mouth at bedtime as needed for sleep. 30 tablet 0   amLODipine (NORVASC) 5 MG tablet Take 1 tablet (5 mg total) by mouth daily. 30 tablet 11   aspirin 81 MG EC tablet Take 81 mg by mouth daily.     atorvastatin (LIPITOR) 80 MG tablet Take 1 tablet (80 mg total) by mouth daily. 90 tablet 3   benazepril (LOTENSIN) 20 MG tablet Take 1 tablet (20 mg total) by mouth daily. 90 tablet 3   Cholecalciferol 250 MCG (10000 UT) TABS Take 1,000 Units by mouth daily.     cyanocobalamin 1000 MCG tablet Take 1,000 mcg by mouth daily.     fluticasone (FLONASE) 50 MCG/ACT nasal spray Place 2 sprays into both nostrils daily.      gabapentin  (NEURONTIN) 300 MG capsule Take 300 mg by mouth 2 (two) times daily.     melatonin 3 MG TABS tablet Take 10 mg by mouth at bedtime.     metoprolol tartrate (LOPRESSOR) 50 MG tablet Take 1 tablet (50 mg total) by mouth 2 (two) times daily. 180 tablet 3   naproxen sodium (ALEVE) 220 MG tablet Take 220 mg by mouth 2 (two) times daily.     omeprazole (PRILOSEC OTC) 20 MG tablet Take 10 mg by mouth daily.     ranolazine (RANEXA) 1000 MG SR tablet Take 1 tablet (1,000 mg total) by mouth 2 (two) times daily. 180 tablet 3    OBJECTIVE:  PHYSICAL EXAM  Vitals: Blood pressure 110/62, pulse 63, temperature 97.7 F (36.5 C), temperature source Oral, resp. rate 19, height 5\' 6"  (1.676 m), weight 89.8 kg, SpO2 99 %.. General: Well-developed, Well-nourished in no acute distress Mood: Mood and affect well adjusted, pleasant and cooperative. Orientation: Grossly alert and oriented. Vocal Quality: No hoarseness. Communicates verbally. head and Face: NCAT. No facial asymmetry. No visible skin lesions. No significant facial scars. No tenderness with sinus percussion. Facial strength normal and symmetric. Eyes: Gaze and Ocular Motility are grossly normal.  No significant proptosis is noted and prior edema and erythema of the lid is resolved.  MEDICAL DECISION MAKING: Data Review:  Results for orders placed or performed during the hospital encounter of 03/19/21 (from the past 48 hour(s))  CBC     Status: Abnormal   Collection Time: 03/19/21  3:44 PM  Result Value Ref Range   WBC 12.4 (H) 4.0 - 10.5 K/uL   RBC 3.50 (L) 4.22 - 5.81 MIL/uL   Hemoglobin 12.4 (L) 13.0 - 17.0 g/dL   HCT 34.1 (L) 39.0 - 52.0 %   MCV 97.4 80.0 - 100.0 fL   MCH 35.4 (H) 26.0 - 34.0 pg   MCHC 36.4 (H) 30.0 - 36.0 g/dL   RDW 12.3 11.5 - 15.5 %   Platelets 292 150 - 400 K/uL   nRBC 0.0 0.0 - 0.2 %    Comment: Performed at [facility], [address] [place], [place] [address]  Differential     Status: Abnormal    Collection Time: 03/19/21  3:44 PM  Result Value Ref Range   Neutrophils Relative % 73 %   Neutro Abs 9.0 (H) 1.7 - 7.7 K/uL   Lymphocytes Relative 15 %   Lymphs Abs 1.9 0.7 - 4.0 K/uL   Monocytes Relative 10 %   Monocytes Absolute 1.3 (H) 0.1 - 1.0 K/uL   Eosinophils Relative 1 %   Eosinophils Absolute 0.1 0.0 - 0.5 K/uL   Basophils Relative 0 %   Basophils Absolute 0.1 0.0 - 0.1 K/uL   Immature Granulocytes 1 %   Abs Immature Granulocytes 0.07 0.00 - 0.07 K/uL    Comment: Performed at [facility], [address] [place], [place] [address]  Comprehensive metabolic panel     Status: Abnormal   Collection Time: 03/19/21  3:44 PM  Result Value Ref Range   Sodium 123 (L) 135 - 145 mmol/L   Potassium 4.3 3.5 - 5.1 mmol/L   Chloride 88 (L) 98 - 111 mmol/L   CO2 24 22 - 32 mmol/L   Glucose, Bld 179 (H) 70 - 99 mg/dL    Comment: Glucose reference range applies only to samples taken after fasting for at least 8 hours.   BUN 21 8 - 23 mg/dL   Creatinine, Ser 0.88 0.61 - 1.24 mg/dL   Calcium 9.6 8.9 - 10.3 mg/dL   Total Protein 7.9 6.5 - 8.1 g/dL   Albumin 4.2 3.5 - 5.0 g/dL   AST 14 (L) 15 - 41 U/L   ALT 7 0 - 44 U/L   Alkaline Phosphatase 66 38 - 126 U/L   Total Bilirubin 1.4 (H) 0.3 - 1.2 mg/dL   GFR, Estimated >60 >60 mL/min    Comment: (NOTE) Calculated using the CKD-EPI Creatinine Equation (2021)    Anion gap 11 5 - 15    Comment: Performed at [facility], [address] [place], [place] [address]  Sedimentation rate     Status: Abnormal   Collection Time: 03/19/21  3:44 PM  Result Value Ref Range   Sed Rate 67 (H) 0 - 20 mm/hr    Comment: Performed at [facility], [address] [place], [place] [address]  Hemoglobin A1c     Status: Abnormal   Collection Time: 03/19/21  3:44 PM  Result Value Ref Range   Hgb A1c MFr Bld 6.6 (H) 4.8 - 5.6 %    Comment: (NOTE)         Prediabetes: 5.7 - 6.4         Diabetes: >6.4         Glycemic  control for adults with diabetes: <7.0    Mean Plasma Glucose 143 mg/dL    Comment: (NOTE) Performed At: [facility] [place], [place] [id number] Rush Farmer MD MW:[id number]   Procalcitonin - Baseline     Status: None   Collection Time: 03/19/21  3:44 PM  Result Value Ref Range   Procalcitonin <0.10 ng/mL    Comment:        Interpretation: PCT (Procalcitonin) <= 0.5 ng/mL: Systemic infection (sepsis) is not likely. Local bacterial infection is possible. (NOTE)       Sepsis PCT Algorithm           Lower Respiratory Tract  Infection PCT Algorithm    ----------------------------     ----------------------------         PCT < 0.25 ng/mL                PCT < 0.10 ng/mL          Strongly encourage             Strongly discourage   discontinuation of antibiotics    initiation of antibiotics    ----------------------------     -----------------------------       PCT 0.25 - 0.50 ng/mL            PCT 0.10 - 0.25 ng/mL               OR       >80% decrease in PCT            Discourage initiation of                                            antibiotics      Encourage discontinuation           of antibiotics    ----------------------------     -----------------------------         PCT >= 0.50 ng/mL              PCT 0.26 - 0.50 ng/mL               AND        <80% decrease in PCT             Encourage initiation of                                             antibiotics       Encourage continuation           of antibiotics    ----------------------------     -----------------------------        PCT >= 0.50 ng/mL                  PCT > 0.50 ng/mL               AND         increase in PCT                  Strongly encourage                                      initiation of antibiotics    Strongly encourage escalation           of antibiotics                                     -----------------------------                                            PCT <= 0.25 ng/mL  OR                                        > 80% decrease in PCT                                      Discontinue / Do not initiate                                             antibiotics  Performed at [facility], [address] [place], [place] [address]   CBG monitoring, ED     Status: Abnormal   Collection Time: 03/19/21  3:49 PM  Result Value Ref Range   Glucose-Capillary 200 (H) 70 - 99 mg/dL    Comment: Glucose reference range applies only to samples taken after fasting for at least 8 hours.  MRSA Next Gen by PCR, Nasal     Status: None   Collection Time: 03/19/21  9:54 PM   Specimen: Nasopharyngeal Swab; Nasal Swab  Result Value Ref Range   MRSA by PCR Next Gen NOT DETECTED NOT DETECTED    Comment: (NOTE) The GeneXpert MRSA Assay (FDA approved for NASAL specimens only), is one component of a comprehensive MRSA colonization surveillance program. It is not intended to diagnose MRSA infection nor to guide or monitor treatment for MRSA infections. Test performance is not FDA approved in patients less than 33 years old. Performed at [facility], [address] [place], [place] [address]   Resp Panel by RT-PCR (Flu A&B, Covid) Nasopharyngeal Swab     Status: None   Collection Time: 03/19/21 10:18 PM   Specimen: Nasopharyngeal Swab; Nasopharyngeal(NP) swabs in vial transport medium  Result Value Ref Range   SARS Coronavirus 2 by RT PCR NEGATIVE NEGATIVE    Comment: (NOTE) SARS-CoV-2 target nucleic acids are NOT DETECTED.  The SARS-CoV-2 RNA is generally detectable in upper respiratory specimens during the acute phase of infection. The lowest concentration of SARS-CoV-2 viral copies this assay can detect is 138 copies/mL. A negative result does not preclude SARS-Cov-2 infection and should not be used as the sole basis for treatment or other patient management  decisions. A negative result may occur with  improper specimen collection/handling, submission of specimen other than nasopharyngeal swab, presence of viral mutation(s) within the areas targeted by this assay, and inadequate number of viral copies(<138 copies/mL). A negative result must be combined with clinical observations, patient history, and epidemiological information. The expected result is Negative.  Fact Sheet for Patients:  EntrepreneurPulse.com.au  Fact Sheet for Healthcare Providers:  IncredibleEmployment.be  This test is no t yet approved or cleared by the [place] FDA and  has been authorized for detection and/or diagnosis of SARS-CoV-2 by FDA under an Emergency Use Authorization (EUA). This EUA will remain  in effect (meaning this test can be used) for the duration of the COVID-19 declaration under Section 564(b)(1) of the Act, 21 U.S.C.section 360bbb-3(b)(1), unless the authorization is terminated  or revoked sooner.       Influenza A by PCR NEGATIVE NEGATIVE   Influenza B by PCR NEGATIVE NEGATIVE    Comment: (NOTE) The Xpert Xpress SARS-CoV-2/FLU/RSV plus assay is intended  as an aid in the diagnosis of influenza from Nasopharyngeal swab specimens and should not be used as a sole basis for treatment. Nasal washings and aspirates are unacceptable for Xpert Xpress SARS-CoV-2/FLU/RSV testing.  Fact Sheet for Patients: EntrepreneurPulse.com.au  Fact Sheet for Healthcare Providers: IncredibleEmployment.be  This test is not yet approved or cleared by the [place] FDA and has been authorized for detection and/or diagnosis of SARS-CoV-2 by FDA under an Emergency Use Authorization (EUA). This EUA will remain in effect (meaning this test can be used) for the duration of the COVID-19 declaration under Section 564(b)(1) of the Act, 21 U.S.C. section 360bbb-3(b)(1), unless the authorization  is terminated or revoked.  Performed at [facility], [address] [place], [place] [address]   CBG monitoring, ED     Status: Abnormal   Collection Time: 03/19/21 10:35 PM  Result Value Ref Range   Glucose-Capillary 161 (H) 70 - 99 mg/dL    Comment: Glucose reference range applies only to samples taken after fasting for at least 8 hours.  CULTURE, BLOOD (ROUTINE X 2) w Reflex to ID Panel     Status: None (Preliminary result)   Collection Time: 03/20/21 12:58 AM   Specimen: BLOOD  Result Value Ref Range   Specimen Description BLOOD LEFT ARM    Special Requests      BOTTLES DRAWN AEROBIC AND ANAEROBIC Blood Culture adequate volume   Culture      NO GROWTH 1 DAY Performed at [facility], [address], [place], [place] [address]    Report Status PENDING   CULTURE, BLOOD (ROUTINE X 2) w Reflex to ID Panel     Status: None (Preliminary result)   Collection Time: 03/20/21 12:58 AM   Specimen: BLOOD  Result Value Ref Range   Specimen Description BLOOD LEFT HAND    Special Requests      BOTTLES DRAWN AEROBIC AND ANAEROBIC Blood Culture adequate volume   Culture      NO GROWTH 1 DAY Performed at [facility], [address] [place], [place] [address]    Report Status PENDING   Basic metabolic panel     Status: Abnormal   Collection Time: 03/20/21  4:03 AM  Result Value Ref Range   Sodium 128 (L) 135 - 145 mmol/L   Potassium 4.3 3.5 - 5.1 mmol/L   Chloride 95 (L) 98 - 111 mmol/L   CO2 26 22 - 32 mmol/L   Glucose, Bld 150 (H) 70 - 99 mg/dL    Comment: Glucose reference range applies only to samples taken after fasting for at least 8 hours.   BUN 16 8 - 23 mg/dL   Creatinine, Ser 0.89 0.61 - 1.24 mg/dL   Calcium 9.2 8.9 - 10.3 mg/dL   GFR, Estimated >60 >60 mL/min    Comment: (NOTE) Calculated using the CKD-EPI Creatinine Equation (2021)    Anion gap 7 5 - 15    Comment: Performed at [facility], [address] [place],  [place] [address]  CBC     Status: Abnormal   Collection Time: 03/20/21  4:03 AM  Result Value Ref Range   WBC 9.2 4.0 - 10.5 K/uL   RBC 3.21 (L) 4.22 - 5.81 MIL/uL   Hemoglobin 11.6 (L) 13.0 - 17.0 g/dL   HCT 31.8 (L) 39.0 - 52.0 %   MCV 99.1 80.0 - 100.0 fL   MCH 36.1 (H) 26.0 - 34.0 pg   MCHC 36.5 (H) 30.0 -  36.0 g/dL   RDW 12.3 11.5 - 15.5 %   Platelets 244 150 - 400 K/uL   nRBC 0.0 0.0 - 0.2 %    Comment: Performed at [facility], [address] [place], [place] [address]  Ferritin     Status: None   Collection Time: 03/20/21  4:03 AM  Result Value Ref Range   Ferritin 234 24 - 336 ng/mL    Comment: Performed at [facility], [address] [place], [place] [address]  Lactate dehydrogenase     Status: None   Collection Time: 03/20/21  4:03 AM  Result Value Ref Range   LDH 103 98 - 192 U/L    Comment: Performed at [facility], [address] [place], [place] [address]  D-dimer, quantitative     Status: Abnormal   Collection Time: 03/20/21  4:03 AM  Result Value Ref Range   D-Dimer, Quant 1.08 (H) 0.00 - 0.50 ug/mL-FEU    Comment: (NOTE) At the manufacturer cut-off value of 0.5 g/mL FEU, this assay has a negative predictive value of 95-100%.This assay is intended for use in conjunction with a clinical pretest probability (PTP) assessment model to exclude pulmonary embolism (PE) and deep venous thrombosis (DVT) in outpatients suspected of PE or DVT. Results should be correlated with clinical presentation. Performed at [facility], [address] [place], [place] [address]   C-reactive protein     Status: Abnormal   Collection Time: 03/20/21  4:03 AM  Result Value Ref Range   CRP 7.9 (H) <1.0 mg/dL    Comment: Performed at [facility] [address], [place], [place] [address]  Glucose, capillary     Status: Abnormal   Collection Time: 03/20/21  7:20 AM  Result Value Ref Range   Glucose-Capillary 142 (H) 70 - 99 mg/dL     Comment: Glucose reference range applies only to samples taken after fasting for at least 8 hours.  Aerobic/Anaerobic Culture w Gram Stain (surgical/deep wound)     Status: None (Preliminary result)   Collection Time: 03/20/21  8:14 AM   Specimen: PATH Sinus Contents/Nasal Polyps  Result Value Ref Range   Specimen Description      MAXILLARY SINUS Performed at [facility], [address], [place], [place] [address]    Special Requests      NONE Performed at [facility], [place], [place] [address]    Gram Stain      NO ORGANISMS SEEN SQUAMOUS EPITHELIAL CELLS PRESENT FEW WBC PRESENT, PREDOMINANTLY MONONUCLEAR FEW GRAM POSITIVE COCCI MODERATE HYPHAL ELEMENTS SEEN Performed at [facility], [place] [address] [place],  [address]    Culture FEW STAPHYLOCOCCUS AUREUS    Report Status PENDING   Glucose, capillary     Status: Abnormal   Collection Time: 03/20/21  9:27 AM  Result Value Ref Range   Glucose-Capillary 152 (H) 70 - 99 mg/dL    Comment: Glucose reference range applies only to samples taken after fasting for at least 8 hours.  Glucose, capillary     Status: Abnormal   Collection Time: 03/20/21 11:31 AM  Result Value Ref Range   Glucose-Capillary 179 (H) 70 - 99 mg/dL    Comment: Glucose reference range applies only to samples taken after fasting for at least 8 hours.  Glucose, capillary     Status: Abnormal   Collection Time: 03/20/21  4:39 PM  Result Value Ref Range   Glucose-Capillary  203 (H) 70 - 99 mg/dL    Comment: Glucose reference range applies only to samples taken after fasting for at least 8 hours.  Glucose, capillary     Status: Abnormal   Collection Time: 03/20/21  9:23 PM  Result Value Ref Range   Glucose-Capillary 224 (H) 70 - 99 mg/dL    Comment: Glucose reference range applies only to samples taken after fasting for at least 8 hours.   Comment 1 Notify RN   CBC     Status: Abnormal   Collection Time:  03/21/21  6:21 AM  Result Value Ref Range   WBC 17.8 (H) 4.0 - 10.5 K/uL   RBC 3.23 (L) 4.22 - 5.81 MIL/uL   Hemoglobin 11.5 (L) 13.0 - 17.0 g/dL   HCT 32.3 (L) 39.0 - 52.0 %   MCV 100.0 80.0 - 100.0 fL   MCH 35.6 (H) 26.0 - 34.0 pg   MCHC 35.6 30.0 - 36.0 g/dL   RDW 12.4 11.5 - 15.5 %   Platelets 284 150 - 400 K/uL   nRBC 0.0 0.0 - 0.2 %    Comment: Performed at [facility], [address], [place], [place] [address]  Basic metabolic panel     Status: Abnormal   Collection Time: 03/21/21  6:21 AM  Result Value Ref Range   Sodium 131 (L) 135 - 145 mmol/L   Potassium 4.3 3.5 - 5.1 mmol/L   Chloride 97 (L) 98 - 111 mmol/L   CO2 23 22 - 32 mmol/L   Glucose, Bld 186 (H) 70 - 99 mg/dL    Comment: Glucose reference range applies only to samples taken after fasting for at least 8 hours.   BUN 22 8 - 23 mg/dL   Creatinine, Ser 0.85 0.61 - 1.24 mg/dL   Calcium 9.2 8.9 - 10.3 mg/dL   GFR, Estimated >60 >60 mL/min    Comment: (NOTE) Calculated using the CKD-EPI Creatinine Equation (2021)    Anion gap 11 5 - 15    Comment: Performed at [facility], [address] [place], [place] [address]  Glucose, capillary     Status: Abnormal   Collection Time: 03/21/21  8:10 AM  Result Value Ref Range   Glucose-Capillary 154 (H) 70 - 99 mg/dL    Comment: Glucose reference range applies only to samples taken after fasting for at least 8 hours.   Comment 1 Notify RN    Comment 2 Document in Chart   Glucose, capillary     Status: Abnormal   Collection Time: 03/21/21 12:48 PM  Result Value Ref Range   Glucose-Capillary 171 (H) 70 - 99 mg/dL    Comment: Glucose reference range applies only to samples taken after fasting for at least 8 hours.   Comment 1 Notify RN    Comment 2 Document in Chart   .  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  Frozen  sections from surgery showed no evidence of tissue invasion of the fungus.  Fungal elements were identified on Gram stain.  Grossly there was quite a bit of mucosal inflammation in the sinus but the floor the orbit area was thin but there was no evidence of frank necrosis into the orbital fat or any definite necrotic tissue for that matter. PLAN: Clinically he continues to improve.  His white count was elevated in the absence of fever and I suspect this was due  to the Decadron given by anesthesia.  I think is reasonable to watch him until we get the final path back and make sure there is no invasive fungal disease, but that is seeming much less likely given his continued improvement.  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  Prednisone can be an adjunct to help reduce mucosal inflammation, and this might be a consideration as long as pathology is negative for an invasive fungal process.  Hopefully if we get the path back tomorrow he can go home on oral antibiotic therapy.  Would continue saline irrigation to help clear secretions.   Daniel Nearing, MD 03/21/2021 12:58 PM Patient ID: Daniel Holden, male   DOB: 12/12/1945, 75 y.o.   MRN: [id number]

## 2021-03-22 DIAGNOSIS — H05012 Cellulitis of left orbit: Secondary | ICD-10-CM | POA: Diagnosis not present

## 2021-03-22 DIAGNOSIS — G4733 Obstructive sleep apnea (adult) (pediatric): Secondary | ICD-10-CM

## 2021-03-22 DIAGNOSIS — F419 Anxiety disorder, unspecified: Secondary | ICD-10-CM

## 2021-03-22 DIAGNOSIS — J0100 Acute maxillary sinusitis, unspecified: Secondary | ICD-10-CM | POA: Diagnosis not present

## 2021-03-22 DIAGNOSIS — Z9989 Dependence on other enabling machines and devices: Secondary | ICD-10-CM

## 2021-03-22 LAB — GLUCOSE, CAPILLARY
Glucose-Capillary: 152 mg/dL — ABNORMAL HIGH (ref 70–99)
Glucose-Capillary: 200 mg/dL — ABNORMAL HIGH (ref 70–99)
Glucose-Capillary: 135 mg/dL — ABNORMAL HIGH (ref 70–99)
Glucose-Capillary: 200 mg/dL — ABNORMAL HIGH (ref 70–99)

## 2021-03-22 LAB — CBC
HCT: 31.4 % — ABNORMAL LOW (ref 39.0–52.0)
Hemoglobin: 11 g/dL — ABNORMAL LOW (ref 13.0–17.0)
MCH: 35.1 pg — ABNORMAL HIGH (ref 26.0–34.0)
MCHC: 35 g/dL (ref 30.0–36.0)
MCV: 100.3 fL — ABNORMAL HIGH (ref 80.0–100.0)
Platelets: 267 10*3/uL (ref 150–400)
RBC: 3.13 MIL/uL — ABNORMAL LOW (ref 4.22–5.81)
RDW: 12.8 % (ref 11.5–15.5)
WBC: 9.2 10*3/uL (ref 4.0–10.5)
nRBC: 0 % (ref 0.0–0.2)

## 2021-03-22 LAB — FUNGAL STAIN REFLEX

## 2021-03-22 LAB — FUNGUS STAIN

## 2021-03-22 MED ORDER — SODIUM CHLORIDE 0.9 % IV SOLN
3.0000 g | Freq: Four times a day (QID) | INTRAVENOUS | Status: DC
  Administered 2021-03-22 – 2021-03-23 (×5): 3 g via INTRAVENOUS
  Filled 2021-03-22: qty 3
  Filled 2021-03-22 (×2): qty 8
  Filled 2021-03-22: qty 3
  Filled 2021-03-22 (×2): qty 8
  Filled 2021-03-22: qty 3
  Filled 2021-03-22 (×2): qty 8

## 2021-03-22 NOTE — Progress Notes (Signed)
Daniel Holden, Daniel Holden [id number] 04/09/1946 Daniel Nearing, MD   SUBJECTIVE: This 75 y.o. year old male is status post right maxillary antrostomy and anterior ethmoidectomy for management of sinusitis with secondary orbital cellulitis.  He continues to do well with resolution of eye pain and vision change.  He remains on IV antibiotics.  Pathology is still pending from the surgery although frozen section showed no evidence of invasive fungal disease.  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  Medications:  Current Facility-Administered Medications  Medication Dose Route Frequency Provider Last Rate Last Admin   0.9 %  sodium chloride infusion   Intravenous PRN Cox, Amy N, DO 10 mL/hr at 03/21/21 1541 250 mL at 03/21/21 1541   acetaminophen (TYLENOL) tablet 650 mg  650 mg Oral Q6H PRN Cox, Amy N, DO   650 mg at 03/21/21 5093   Or   acetaminophen (TYLENOL) suppository 650 mg  650 mg Rectal Q6H PRN Cox, Amy N, DO       ALPRAZolam Duanne Moron) tablet 0.25 mg  0.25 mg Oral QHS PRN Cox, Amy N, DO   0.25 mg at 03/21/21 2201   amLODipine (NORVASC) tablet 5 mg  5 mg Oral Daily Cox, Amy N, DO   5 mg at 03/22/21 0939   Ampicillin-Sulbactam (UNASYN) 3 g in sodium chloride 0.9 % 100 mL IVPB  3 g Intravenous Q6H Ravishankar, Joellyn Quails, MD 200 mL/hr at 03/22/21 1353 3 g at 03/22/21 1353   atorvastatin (LIPITOR) tablet 80 mg  80 mg Oral Daily Dessa Phi, DO   80 mg at 03/22/21 1741   benazepril (LOTENSIN) tablet 20 mg  20 mg Oral Daily Cox, Amy N, DO   20 mg at 03/22/21 2671   cholecalciferol (VITAMIN D) tablet 5,000 Units  5,000 Units Oral Daily Cox, Amy N, DO   5,000 Units at 03/22/21 2458   docusate sodium (COLACE) capsule 100 mg  100 mg Oral BID PRN Dessa Phi, DO       erythromycin ophthalmic ointment 1 application  1 application Right Eye K9X Dessa Phi, DO   1 application at 83/38/25 1348   fluticasone (FLONASE) 50 MCG/ACT  nasal spray 2 spray  2 spray Each Nare Daily Cox, Amy N, DO       gabapentin (NEURONTIN) capsule 300 mg  300 mg Oral BID Cox, Amy N, DO   300 mg at 03/22/21 0539   insulin aspart (novoLOG) injection 0-5 Units  0-5 Units Subcutaneous QHS Cox, Amy N, DO   2 Units at 03/21/21 2200   insulin aspart (novoLOG) injection 0-9 Units  0-9 Units Subcutaneous TID WC Cox, Amy N, DO   1 Units at 03/22/21 1741   melatonin tablet 10 mg  10 mg Oral QHS Cox, Amy N, DO   10 mg at 03/21/21 2201   metoprolol tartrate (LOPRESSOR) tablet 50 mg  50 mg Oral BID Cox, Amy N, DO   50 mg at 03/22/21 0939   ondansetron (ZOFRAN) tablet 4 mg  4 mg Oral Q6H PRN Cox, Amy N, DO       Or   ondansetron (ZOFRAN) injection 4 mg  4 mg Intravenous Q6H PRN Cox, Amy N, DO       pantoprazole (PROTONIX) EC tablet 40 mg  40 mg Oral Q breakfast Renda Rolls, RPH   40 mg at 03/22/21 0850   polyethylene glycol (MIRALAX / GLYCOLAX) packet 17 g  17 g Oral  Daily Dessa Phi, DO   17 g at 03/21/21 6073   ranolazine (RANEXA) 12 hr tablet 1,000 mg  1,000 mg Oral BID Cox, Amy N, DO   1,000 mg at 03/22/21 0940   sodium chloride (OCEAN) 0.65 % nasal spray 4 spray  4 spray Nasal QID Clyde Canterbury, MD   4 spray at 03/22/21 1348   vitamin B-12 (CYANOCOBALAMIN) tablet 1,000 mcg  1,000 mcg Oral Daily Cox, Amy N, DO   1,000 mcg at 03/22/21 7106  .  Medications Prior to Admission  Medication Sig Dispense Refill   ALPRAZolam (XANAX) 0.25 MG tablet Take 1 tablet (0.25 mg total) by mouth at bedtime as needed for sleep. 30 tablet 0   amLODipine (NORVASC) 5 MG tablet Take 1 tablet (5 mg total) by mouth daily. 30 tablet 11   aspirin 81 MG EC tablet Take 81 mg by mouth daily.     atorvastatin (LIPITOR) 80 MG tablet Take 1 tablet (80 mg total) by mouth daily. 90 tablet 3   benazepril (LOTENSIN) 20 MG tablet Take 1 tablet (20 mg total) by mouth daily. 90 tablet 3   Cholecalciferol 250 MCG (10000 UT) TABS Take 1,000 Units by mouth daily.     cyanocobalamin 1000  MCG tablet Take 1,000 mcg by mouth daily.     fluticasone (FLONASE) 50 MCG/ACT nasal spray Place 2 sprays into both nostrils daily.      gabapentin (NEURONTIN) 300 MG capsule Take 300 mg by mouth 2 (two) times daily.     melatonin 3 MG TABS tablet Take 10 mg by mouth at bedtime.     metoprolol tartrate (LOPRESSOR) 50 MG tablet Take 1 tablet (50 mg total) by mouth 2 (two) times daily. 180 tablet 3   naproxen sodium (ALEVE) 220 MG tablet Take 220 mg by mouth 2 (two) times daily.     omeprazole (PRILOSEC OTC) 20 MG tablet Take 10 mg by mouth daily.     ranolazine (RANEXA) 1000 MG SR tablet Take 1 tablet (1,000 mg total) by mouth 2 (two) times daily. 180 tablet 3    OBJECTIVE:  PHYSICAL EXAM  Vitals: Blood pressure (!) 130/96, pulse 61, temperature (!) 97.5 F (36.4 C), temperature source Oral, resp. rate 18, height 5\' 6"  (1.676 m), weight 89.8 kg, SpO2 100 %.. General: Well-developed, Well-nourished in no acute distress Mood: Mood and affect well adjusted, pleasant and cooperative. Orientation: Grossly alert and oriented. Vocal Quality: No hoarseness. Communicates verbally. head and Face: NCAT. No facial asymmetry. No visible skin lesions. No significant facial scars. Facial strength normal and symmetric. Eyes: Gaze and Ocular Motility are grossly normal.  There is no further edema or erythema notable involving the eyelids.[name]  MEDICAL DECISION MAKING: Data Review:  Results for orders placed or performed during the hospital encounter of 03/19/21 (from the past 48 hour(s))  Glucose, capillary     Status: Abnormal   Collection Time: 03/20/21  9:23 PM  Result Value Ref Range   Glucose-Capillary 224 (H) 70 - 99 mg/dL    Comment: Glucose reference range applies only to samples taken after fasting for at least 8 hours.   Comment 1 Notify RN   CBC     Status: Abnormal   Collection Time: 03/21/21  6:21 AM  Result Value Ref Range   WBC 17.8 (H) 4.0 - 10.5 K/uL   RBC 3.23 (L) 4.22 - 5.81 MIL/uL    Hemoglobin 11.5 (L) 13.0 - 17.0 g/dL   HCT 32.3 (L) 39.0 -  52.0 %   MCV 100.0 80.0 - 100.0 fL   MCH 35.6 (H) 26.0 - 34.0 pg   MCHC 35.6 30.0 - 36.0 g/dL   RDW 12.4 11.5 - 15.5 %   Platelets 284 150 - 400 K/uL   nRBC 0.0 0.0 - 0.2 %    Comment: Performed at [facility], [address] [place], [place] [address]  Basic metabolic panel     Status: Abnormal   Collection Time: 03/21/21  6:21 AM  Result Value Ref Range   Sodium 131 (L) 135 - 145 mmol/L   Potassium 4.3 3.5 - 5.1 mmol/L   Chloride 97 (L) 98 - 111 mmol/L   CO2 23 22 - 32 mmol/L   Glucose, Bld 186 (H) 70 - 99 mg/dL    Comment: Glucose reference range applies only to samples taken after fasting for at least 8 hours.   BUN 22 8 - 23 mg/dL   Creatinine, Ser 0.85 0.61 - 1.24 mg/dL   Calcium 9.2 8.9 - 10.3 mg/dL   GFR, Estimated >60 >60 mL/min    Comment: (NOTE) Calculated using the CKD-EPI Creatinine Equation (2021)    Anion gap 11 5 - 15    Comment: Performed at [facility], [address] [place], [place] [address]  Glucose, capillary     Status: Abnormal   Collection Time: 03/21/21  8:10 AM  Result Value Ref Range   Glucose-Capillary 154 (H) 70 - 99 mg/dL    Comment: Glucose reference range applies only to samples taken after fasting for at least 8 hours.   Comment 1 Notify RN    Comment 2 Document in Chart   Glucose, capillary     Status: Abnormal   Collection Time: 03/21/21 12:48 PM  Result Value Ref Range   Glucose-Capillary 171 (H) 70 - 99 mg/dL    Comment: Glucose reference range applies only to samples taken after fasting for at least 8 hours.   Comment 1 Notify RN    Comment 2 Document in Chart   Glucose, capillary     Status: Abnormal   Collection Time: 03/21/21  4:39 PM  Result Value Ref Range   Glucose-Capillary 196 (H) 70 - 99 mg/dL    Comment: Glucose reference range applies only to samples taken after fasting for at least 8 hours.   Comment 1 Notify RN    Comment 2 Document  in Chart   Glucose, capillary     Status: Abnormal   Collection Time: 03/21/21  9:43 PM  Result Value Ref Range   Glucose-Capillary 214 (H) 70 - 99 mg/dL    Comment: Glucose reference range applies only to samples taken after fasting for at least 8 hours.   Comment 1 Notify RN   CBC     Status: Abnormal   Collection Time: 03/22/21  6:31 AM  Result Value Ref Range   WBC 9.2 4.0 - 10.5 K/uL   RBC 3.13 (L) 4.22 - 5.81 MIL/uL   Hemoglobin 11.0 (L) 13.0 - 17.0 g/dL   HCT 31.4 (L) 39.0 - 52.0 %   MCV 100.3 (H) 80.0 - 100.0 fL   MCH 35.1 (H) 26.0 - 34.0 pg   MCHC 35.0 30.0 - 36.0 g/dL   RDW 12.8 11.5 - 15.5 %   Platelets 267 150 - 400 K/uL   nRBC 0.0 0.0 - 0.2 %    Comment: Performed at [facility], [address] [place], [place] [address]  Glucose, capillary  Status: Abnormal   Collection Time: 03/22/21  8:17 AM  Result Value Ref Range   Glucose-Capillary 152 (H) 70 - 99 mg/dL    Comment: Glucose reference range applies only to samples taken after fasting for at least 8 hours.   Comment 1 Notify RN    Comment 2 Document in Chart   Glucose, capillary     Status: Abnormal   Collection Time: 03/22/21 11:24 AM  Result Value Ref Range   Glucose-Capillary 200 (H) 70 - 99 mg/dL    Comment: Glucose reference range applies only to samples taken after fasting for at least 8 hours.   Comment 1 Notify RN    Comment 2 Document in Chart   Glucose, capillary     Status: Abnormal   Collection Time: 03/22/21  4:54 PM  Result Value Ref Range   Glucose-Capillary 135 (H) 70 - 99 mg/dL    Comment: Glucose reference range applies only to samples taken after fasting for at least 8 hours.   Comment 1 Notify RN    Comment 2 Document in Chart   . No results found..   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  Likelihood of invasive fungal disease is low although we are still waiting on the final pathology.  Clinically this is all resolving.  PLAN:  Continue IV antibiotics for now but as soon as we get the pathology back, as long as no invasive fungal disease is seen, he can be discharged home on oral antibiotics.  Discussed with ID and Augmentin is certainly quite reasonable for oxacillin sensitive staph aureus.  Would also have him continue saline irrigations at home to help clear any residual secretions from the sinus.  I will plan to see him back in the office in a week or 2 after discharge.   Daniel Nearing, MD 03/22/2021 6:28 PM Patient ID: Daniel Holden, male   DOB: 10-20-1945, 75 y.o.   MRN: [id number]

## 2021-03-22 NOTE — Plan of Care (Signed)
Nutrition Education Note  RD consulted for nutrition education regarding new onset of Type 2 Diabetes.   Lab Results  Component Value Date   HGBA1C 6.6 (H) 03/19/2021   RD provided "Carbohydrate Counting for People with Diabetes" handout from the Academy of [employer] and Dietetics. Discussed different food groups and their effects on blood sugar, emphasizing carbohydrate-containing foods. Provided list of carbohydrates and recommended serving sizes of common foods.  Discussed importance of controlled and consistent carbohydrate intake throughout the day. Provided examples of ways to balance meals/snacks and encouraged intake of high-fiber, whole grain complex carbohydrates. Talked about the importance of pairing carbohydrates with a protein at meals and snack times. Teach back method used.  Expect good compliance.  Body mass index is 31.95 kg/m. Pt meets criteria for Obesity Class 1 based on current BMI.  Current diet order is Carb Modified, patient is consuming approximately 50-100% of meals at this time. Labs and medications reviewed.   No further nutrition interventions warranted at this time. RD contact information provided. If additional nutrition issues arise, please re-consult RD.  Daniel Holden, RD, LDN (she/her/hers) Registered Dietitian I After-Hours/Weekend Pager # [phone]

## 2021-03-22 NOTE — Progress Notes (Signed)
    ID: Daniel Holden is a 74 y.o. male  Principal Problem:   Maxillary sinusitis Active Problems:   OSA on CPAP   Anxiety   Class 1 obesity due to excess calories with serious comorbidity and body mass index (BMI) of 32.0 to 32.9 in adult   Neuropathy   Primary hypertension   Hepatic steatosis   Periorbital cellulitis    Subjective: Doing well today Walking in corridor No pain face No blurred vision  Medications:   amLODipine  5 mg Oral Daily   atorvastatin  80 mg Oral Daily   benazepril  20 mg Oral Daily   cholecalciferol  5,000 Units Oral Daily   erythromycin  1 application Right Eye J2E   fluticasone  2 spray Each Nare Daily   gabapentin  300 mg Oral BID   insulin aspart  0-5 Units Subcutaneous QHS   insulin aspart  0-9 Units Subcutaneous TID WC   melatonin  10 mg Oral QHS   metoprolol tartrate  50 mg Oral BID   pantoprazole  40 mg Oral Q breakfast   polyethylene glycol  17 g Oral Daily   ranolazine  1,000 mg Oral BID   sodium chloride  4 spray Nasal QID   cyanocobalamin  1,000 mcg Oral Daily    Objective: Vital signs in last 24 hours: Temp:  [97.4 F (36.3 C)-98 F (36.7 C)] 97.4 F (36.3 C) (09/28 0744) Pulse Rate:  [58-65] 65 (09/28 0944) Resp:  [16-20] 18 (09/28 0744) BP: (119-136)/(64-77) 133/77 (09/28 0944) SpO2:  [98 %-100 %] 100 % (09/28 0744)  PHYSICAL EXAM:  General: Alert, cooperative, no distress, appears stated age.  Head: Normocephalic, without obvious abnormality, atraumatic. Eyes: Conjunctivae clear, anicteric sclerae. Pupils are equal ENT Nares normal. No drainage or sinus tenderness. Lips, mucosa, and tongue normal. No Thrush Neurologic: Grossly non-focal  Lab Results Recent Labs    03/20/21 0403 03/21/21 0621 03/22/21 0631  WBC 9.2 17.8* 9.2  HGB 11.6* 11.5* 11.0*  HCT 31.8* 32.3* 31.4*  NA 128* 131*  --   K 4.3 4.3  --   CL 95* 97*  --   CO2 26 23  --   BUN 16 22  --   CREATININE 0.89 0.85  --    Liver  Panel Recent Labs    03/19/21 1544  PROT 7.9  ALBUMIN 4.2  AST 14*  ALT 7  ALKPHOS 66  BILITOT 1.4*   Sedimentation Rate Recent Labs    03/19/21 1544  ESRSEDRATE 67*   C-Reactive Protein Recent Labs    03/20/21 0403  CRP 7.9*    Microbiology: MSSA sinus culture Studies/Results: No results found.   Assessment/Plan: Extensive rt sided pansinusitis with periorbital cellulitis ? S/p rt maxillary antrostomy, with tissue removal, rt anterior ethmoidectomy MSSA in culture- will change cefepime to unasyn   Fungal rhinosinusitis was a concern on the imaging but frozen section of the sinus mucosa taken during surgery today  did not show any hyphae- awaiting final pathology result to see whether he needs antifungal treatment Possible colonization of the sinus with fungus   Discussed the management with patient, wife and care team

## 2021-03-22 NOTE — Progress Notes (Signed)
PROGRESS NOTE    ROGUE RAFALSKI  JHE:[id number] DOB: [birth year] DOA: 03/19/2021 PCP: Cletis Athens, MD     Brief Narrative:  Daniel Holden is a 75 y.o. male with medical history significant for glucose intolerance, A1c of 6.4 in July 2021, hypertension, hyperlipidemia, obstructive sleep apnea, CAD, on CPAP nightly, pulmonary emphysema, obesity class I, anxiety, diabetic neuropathy, GERD, insomnia, who presents emergency department for chief concerns of headache and right-sided facial numbness. Work up with CT and MRI revealed opacification of right maxillary sinus, right periorbital cellulitis.  Case was discussed with ophthalmology who recommended IV antibiotics and MRI orbits.  They recommended ENT consultation for debridement.  ENT was consulted.  Numerous providers have called [name], [facility] for transfer.  [facility] and [facility] have declined to accept patient.  [facility] accepted but there were no beds available 9/25 or 9/26.  Patient was ultimately taken to the OR by Dr. Richardson Landry on 9/26.  Infectious disease was consulted for antibiotic recommendations.  New events last 24 hours / Subjective: Does not have any new complaints.  Feels well.  Assessment & Plan:   Principal Problem:   Maxillary sinusitis Active Problems:   OSA on CPAP   Anxiety   Class 1 obesity due to excess calories with serious comorbidity and body mass index (BMI) of 32.0 to 32.9 in adult   Neuropathy   Primary hypertension   Hepatic steatosis   Periorbital cellulitis   Right maxillary sinusitis, orbital cellulitis -MRI orbits: The right maxillary sinus is completely opacified with complex material. 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 Associated thinning and dehiscence  at the right orbital floor with inflammatory changes and phlegmon involving the inferior postseptal bony right orbit. Right lamina papyracea thinned and possibly dehisced as well. No discrete intraorbital fluid collection or abscess. Associated mild right-sided proptosis. Additional thinning and dehiscence elsewhere about the right maxillary sinus, with evidence for invasion into the right retro antral fat. Mild soft tissue swelling about the right periorbital soft tissues, suggesting associated periorbital cellulitis. -Status post right endoscopic maxillary antrostomy with tissue removal, right anterior ethmoidectomy by Dr. Richardson Landry 9/26. Did not see invasive hyphae in frozen sections  -Tissue biopsies pending. Treat as bacterial infection superimposed on allergic non-invasive fungal sinusitis   -Continue Cefepime, ID following.  Holding Flagyl today -Discussed with ophthalmology for right eye discomfort, recommended for erythromycin ointment 2-3 times daily for couple of days, follow up with [facility] after discharge.  Overall eye symptoms have resolved  Diabetes mellitus, well controlled, new diagnosis -Hemoglobin A1c 6.6 -Consult diabetic coordinator -[facility]I  -Likely dc on oral agent    OSA -Hold off on CPAP use for several weeks  HTN -Continue Norvasc, lotensin, lopressor    HLD -Continue Lipitor  CAD -Continue Ranexa  -Aspirin on hold   Anxiety  -Continue Xanax    DVT prophylaxis:  Place TED hose Start: 03/19/21 1959  Code Status:     Code Status Orders  (From admission, onward)           Start     Ordered   03/19/21 1959  Full code  Continuous        03/19/21 2002           Code Status History     Date Active Date Inactive Code Status Order  ID Comments User Context   10/04/2020 1026 10/04/2020 1913 Full Code [id number]  Leonie Man, MD Inpatient      Advance Directive Documentation    Flowsheet Row Most Recent Value  Type of Advance  Directive Healthcare Power of Attorney  Pre-existing out of facility DNR order (yellow form or pink MOST form) --  "MOST" Form in Place? --      Family Communication: At bedside Disposition Plan:  Status is: Inpatient  Remains inpatient appropriate because:Inpatient level of care appropriate due to severity of illness  Dispo: The patient is from: Home              Anticipated d/c is to: Home              Patient currently is not medically stable to d/c.   Difficult to place patient No      Consultants:  ENT  ID   Procedures:  Status post right endoscopic maxillary antrostomy with tissue removal, right anterior ethmoidectomy by Dr. Richardson Landry 9/26  Antimicrobials:  Anti-infectives (From admission, onward)    Start     Dose/Rate Route Frequency Ordered Stop   03/22/21 1400  Ampicillin-Sulbactam (UNASYN) 3 g in sodium chloride 0.9 % 100 mL IVPB        3 g 200 mL/hr over 30 Minutes Intravenous Every 6 hours 03/22/21 1309     03/20/21 2000  metroNIDAZOLE (FLAGYL) IVPB 500 mg  Status:  Discontinued        500 mg 100 mL/hr over 60 Minutes Intravenous Every 12 hours 03/20/21 1903 03/21/21 1029   03/20/21 0400  fluconazole (DIFLUCAN) IVPB 200 mg  Status:  Discontinued        200 mg 100 mL/hr over 60 Minutes Intravenous Daily 03/20/21 0243 03/20/21 1215   03/20/21 0300  ceFEPIme (MAXIPIME) 2 g in sodium chloride 0.9 % 100 mL IVPB  Status:  Discontinued        2 g 200 mL/hr over 30 Minutes Intravenous Every 8 hours 03/20/21 0139 03/22/21 1309   03/20/21 0030  vancomycin (VANCOREADY) IVPB 2000 mg/400 mL  Status:  Discontinued        2,000 mg 200 mL/hr over 120 Minutes Intravenous  Once 03/20/21 0024 03/20/21 0126   03/20/21 0000  Ampicillin-Sulbactam (UNASYN) 3 g in sodium chloride 0.9 % 100 mL IVPB  Status:  Discontinued        3 g 200 mL/hr over 30 Minutes Intravenous Every 6 hours 03/19/21 2001 03/20/21 0137   03/19/21 1845  Ampicillin-Sulbactam (UNASYN) 3 g in sodium chloride  0.9 % 100 mL IVPB        3 g 200 mL/hr over 30 Minutes Intravenous  Once 03/19/21 1830 03/19/21 1854        Objective: Vitals:   03/22/21 0629 03/22/21 0744 03/22/21 0944 03/22/21 1606  BP: 133/71 136/69 133/77 (!) 130/96  Pulse: 62 (!) 58 65 61  Resp: 16 18  18   Temp: (!) 97.5 F (36.4 C) (!) 97.4 F (36.3 C)  (!) 97.5 F (36.4 C)  TempSrc: Oral Oral  Oral  SpO2: 98% 100%  100%  Weight:      Height:        Intake/Output Summary (Last 24 hours) at 03/22/2021 1938 Last data filed at 03/22/2021 1409 Gross per 24 hour  Intake 480 ml  Output 0 ml  Net 480 ml   Filed Weights   03/19/21 1544 03/20/21 0318 03/20/21 0711  Weight: 88.5 kg 89.8 kg 89.8  kg    Examination: General exam: Alert, awake, oriented x 3 Respiratory system: Clear to auscultation. Respiratory effort normal. Cardiovascular system:RRR. No murmurs, rubs, gallops. Gastrointestinal system: Abdomen is nondistended, soft and nontender. No organomegaly or masses felt. Normal bowel sounds heard. Central nervous system: Alert and oriented. No focal neurological deficits. Extremities: No C/C/E, +pedal pulses Skin: No rashes, lesions or ulcers Psychiatry: Judgement and insight appear normal. Mood & affect appropriate.     Data Reviewed: I have personally reviewed following labs and imaging studies  CBC: Recent Labs  Lab 03/19/21 1544 03/20/21 0403 03/21/21 0621 03/22/21 0631  WBC 12.4* 9.2 17.8* 9.2  NEUTROABS 9.0*  --   --   --   HGB 12.4* 11.6* 11.5* 11.0*  HCT 34.1* 31.8* 32.3* 31.4*  MCV 97.4 99.1 100.0 100.3*  PLT 292 244 284 865   Basic Metabolic Panel: Recent Labs  Lab 03/19/21 1544 03/20/21 0403 03/21/21 0621  NA 123* 128* 131*  K 4.3 4.3 4.3  CL 88* 95* 97*  CO2 24 26 23   GLUCOSE 179* 150* 186*  BUN 21 16 22   CREATININE 0.88 0.89 0.85  CALCIUM 9.6 9.2 9.2   GFR: Estimated Creatinine Clearance: 82.4 mL/min (by C-G formula based on SCr of 0.85 mg/dL). Liver Function Tests: Recent  Labs  Lab 03/19/21 1544  AST 14*  ALT 7  ALKPHOS 66  BILITOT 1.4*  PROT 7.9  ALBUMIN 4.2   No results for input(s): LIPASE, AMYLASE in the last 168 hours. No results for input(s): AMMONIA in the last 168 hours. Coagulation Profile: No results for input(s): INR, PROTIME in the last 168 hours. Cardiac Enzymes: No results for input(s): CKTOTAL, CKMB, CKMBINDEX, TROPONINI in the last 168 hours. BNP (last 3 results) No results for input(s): PROBNP in the last 8760 hours. HbA1C: No results for input(s): HGBA1C in the last 72 hours.  CBG: Recent Labs  Lab 03/21/21 1639 03/21/21 2143 03/22/21 0817 03/22/21 1124 03/22/21 1654  GLUCAP 196* 214* 152* 200* 135*   Lipid Profile: No results for input(s): CHOL, HDL, LDLCALC, TRIG, CHOLHDL, LDLDIRECT in the last 72 hours. Thyroid Function Tests: No results for input(s): TSH, T4TOTAL, FREET4, T3FREE, THYROIDAB in the last 72 hours. Anemia Panel: Recent Labs    03/20/21 0403  FERRITIN 234   Sepsis Labs: Recent Labs  Lab 03/19/21 1544  PROCALCITON <0.10    Recent Results (from the past 240 hour(s))  MRSA Next Gen by PCR, Nasal     Status: None   Collection Time: 03/19/21  9:54 PM   Specimen: Nasopharyngeal Swab; Nasal Swab  Result Value Ref Range Status   MRSA by PCR Next Gen NOT DETECTED NOT DETECTED Final    Comment: (NOTE) The GeneXpert MRSA Assay (FDA approved for NASAL specimens only), is one component of a comprehensive MRSA colonization surveillance program. It is not intended to diagnose MRSA infection nor to guide or monitor treatment for MRSA infections. Test performance is not FDA approved in patients less than 31 years old. Performed at [facility], [address] [place], [place] [address]   Resp Panel by RT-PCR (Flu A&B, Covid) Nasopharyngeal Swab     Status: None   Collection Time: 03/19/21 10:18 PM   Specimen: Nasopharyngeal Swab; Nasopharyngeal(NP) swabs in vial transport medium  Result  Value Ref Range Status   SARS Coronavirus 2 by RT PCR NEGATIVE NEGATIVE Final    Comment: (NOTE) SARS-CoV-2 target nucleic acids are NOT DETECTED.  The SARS-CoV-2 RNA is generally detectable  in upper respiratory specimens during the acute phase of infection. The lowest concentration of SARS-CoV-2 viral copies this assay can detect is 138 copies/mL. A negative result does not preclude SARS-Cov-2 infection and should not be used as the sole basis for treatment or other patient management decisions. A negative result may occur with  improper specimen collection/handling, submission of specimen other than nasopharyngeal swab, presence of viral mutation(s) within the areas targeted by this assay, and inadequate number of viral copies(<138 copies/mL). A negative result must be combined with clinical observations, patient history, and epidemiological information. The expected result is Negative.  Fact Sheet for Patients:  EntrepreneurPulse.com.au  Fact Sheet for Healthcare Providers:  IncredibleEmployment.be  This test is no t yet approved or cleared by the [place] FDA and  has been authorized for detection and/or diagnosis of SARS-CoV-2 by FDA under an Emergency Use Authorization (EUA). This EUA will remain  in effect (meaning this test can be used) for the duration of the COVID-19 declaration under Section 564(b)(1) of the Act, 21 U.S.C.section 360bbb-3(b)(1), unless the authorization is terminated  or revoked sooner.       Influenza A by PCR NEGATIVE NEGATIVE Final   Influenza B by PCR NEGATIVE NEGATIVE Final    Comment: (NOTE) The Xpert Xpress SARS-CoV-2/FLU/RSV plus assay is intended as an aid in the diagnosis of influenza from Nasopharyngeal swab specimens and should not be used as a sole basis for treatment. Nasal washings and aspirates are unacceptable for Xpert Xpress SARS-CoV-2/FLU/RSV testing.  Fact Sheet for  Patients: EntrepreneurPulse.com.au  Fact Sheet for Healthcare Providers: IncredibleEmployment.be  This test is not yet approved or cleared by the [place] FDA and has been authorized for detection and/or diagnosis of SARS-CoV-2 by FDA under an Emergency Use Authorization (EUA). This EUA will remain in effect (meaning this test can be used) for the duration of the COVID-19 declaration under Section 564(b)(1) of the Act, 21 U.S.C. section 360bbb-3(b)(1), unless the authorization is terminated or revoked.  Performed at [facility], [address] [place], [place] [address]   CULTURE, BLOOD (ROUTINE X 2) w Reflex to ID Panel     Status: None (Preliminary result)   Collection Time: 03/20/21 12:58 AM   Specimen: BLOOD  Result Value Ref Range Status   Specimen Description BLOOD LEFT ARM  Final   Special Requests   Final    BOTTLES DRAWN AEROBIC AND ANAEROBIC Blood Culture adequate volume   Culture   Final    NO GROWTH 2 DAYS Performed at [facility], [address] [place], [place] [address]    Report Status PENDING  Incomplete  CULTURE, BLOOD (ROUTINE X 2) w Reflex to ID Panel     Status: None (Preliminary result)   Collection Time: 03/20/21 12:58 AM   Specimen: BLOOD  Result Value Ref Range Status   Specimen Description BLOOD LEFT HAND  Final   Special Requests   Final    BOTTLES DRAWN AEROBIC AND ANAEROBIC Blood Culture adequate volume   Culture   Final    NO GROWTH 2 DAYS Performed at [facility], [address] [place], [place] [address]    Report Status PENDING  Incomplete  Aerobic/Anaerobic Culture w Gram Stain (surgical/deep wound)     Status: None (Preliminary result)   Collection Time: 03/20/21  8:14 AM   Specimen: PATH Sinus Contents/Nasal Polyps  Result Value Ref Range Status   Specimen Description   Final    MAXILLARY SINUS Performed at [facility], [address],  [place], [place] [address]    Special Requests   Final    NONE Performed at [facility], [place], [place] [address]    Gram Stain   Final    NO ORGANISMS SEEN SQUAMOUS EPITHELIAL CELLS PRESENT FEW WBC PRESENT, PREDOMINANTLY MONONUCLEAR FEW GRAM POSITIVE COCCI MODERATE HYPHAL ELEMENTS SEEN Performed at [facility], [place] [address], [place], [place] [address]    Culture   Final    FEW STAPHYLOCOCCUS AUREUS NO ANAEROBES ISOLATED; CULTURE IN PROGRESS FOR 5 DAYS    Report Status PENDING  Incomplete   Organism ID, Bacteria STAPHYLOCOCCUS AUREUS  Final      Susceptibility   Staphylococcus aureus - MIC*    CIPROFLOXACIN >=8 RESISTANT Resistant     ERYTHROMYCIN RESISTANT Resistant     GENTAMICIN <=0.5 SENSITIVE Sensitive     OXACILLIN 0.5 SENSITIVE Sensitive     TETRACYCLINE <=1 SENSITIVE Sensitive     VANCOMYCIN 1 SENSITIVE Sensitive     TRIMETH/SULFA <=10 SENSITIVE Sensitive     CLINDAMYCIN RESISTANT Resistant     RIFAMPIN <=0.5 SENSITIVE Sensitive     Inducible Clindamycin POSITIVE Resistant     * FEW STAPHYLOCOCCUS AUREUS  Fungus Stain     Status: None   Collection Time: 03/20/21  8:14 AM   Specimen: PATH Sinus Contents/Nasal Polyps  Result Value Ref Range Status   FUNGUS STAIN Final report  Final    Comment: (NOTE) Performed At: [facility] [address] [place], [place] [id number] Rush Farmer MD [id number]    Fungal Source MAXILLARY SINUS  Final    Comment: Performed at [facility], [address] [place],  [address]  Fungal Stain reflex     Status: None   Collection Time: 03/20/21  8:14 AM  Result Value Ref Range Status   Fungal stain result 1 Comment  Final    Comment: (NOTE) KOH/Calcofluor preparation:  no fungus observed. Performed At: [facility] [place], [place] [id number] Rush Farmer MD KG:[id number]       Radiology Studies: No results found.    Scheduled Meds:   amLODipine  5 mg Oral Daily   atorvastatin  80 mg Oral Daily   benazepril  20 mg Oral Daily   cholecalciferol  5,000 Units Oral Daily   erythromycin  1 application Right Eye S2G   fluticasone  2 spray Each Nare Daily   gabapentin  300 mg Oral BID   insulin aspart  0-5 Units Subcutaneous QHS   insulin aspart  0-9 Units Subcutaneous TID WC   melatonin  10 mg Oral QHS   metoprolol tartrate  50 mg Oral BID   pantoprazole  40 mg Oral Q breakfast   polyethylene glycol  17 g Oral Daily   ranolazine  1,000 mg Oral BID   sodium chloride  4 spray Nasal QID   cyanocobalamin  1,000 mcg Oral Daily   Continuous Infusions:  sodium chloride 250 mL (03/21/21 1541)   ampicillin-sulbactam (UNASYN) IV 3 g (03/22/21 1353)     LOS: 2 days      Time spent: 25 minutes   Kathie Dike, MD [facility] 03/22/2021, 7:38 PM   Available via Epic secure chat 7am-7pm After these hours, please refer to coverage provider listed on [email or web address]

## 2021-03-22 NOTE — Plan of Care (Signed)

## 2021-03-23 DIAGNOSIS — Z6832 Body mass index (BMI) 32.0-32.9, adult: Secondary | ICD-10-CM

## 2021-03-23 DIAGNOSIS — E871 Hypo-osmolality and hyponatremia: Secondary | ICD-10-CM | POA: Diagnosis not present

## 2021-03-23 DIAGNOSIS — H05012 Cellulitis of left orbit: Secondary | ICD-10-CM | POA: Diagnosis not present

## 2021-03-23 DIAGNOSIS — J0100 Acute maxillary sinusitis, unspecified: Secondary | ICD-10-CM | POA: Diagnosis not present

## 2021-03-23 DIAGNOSIS — E6609 Other obesity due to excess calories: Secondary | ICD-10-CM

## 2021-03-23 LAB — GLUCOSE, CAPILLARY
Glucose-Capillary: 155 mg/dL — ABNORMAL HIGH (ref 70–99)
Glucose-Capillary: 150 mg/dL — ABNORMAL HIGH (ref 70–99)
Glucose-Capillary: 127 mg/dL — ABNORMAL HIGH (ref 70–99)

## 2021-03-23 MED ORDER — LIVING WELL WITH DIABETES BOOK
Freq: Once | Status: AC
  Filled 2021-03-23: qty 1

## 2021-03-23 MED ORDER — NAPROXEN SODIUM 220 MG PO TABS: 220.0000 mg | ORAL_TABLET | Freq: Two times a day (BID) | ORAL | Status: DC | PRN

## 2021-03-23 MED ORDER — ASPIRIN EC 81 MG PO TBEC
81.0000 mg | DELAYED_RELEASE_TABLET | Freq: Every day | ORAL | Status: DC
  Administered 2021-03-23: 81 mg via ORAL
  Filled 2021-03-23: qty 1

## 2021-03-23 MED ORDER — AMOXICILLIN-POT CLAVULANATE 875-125 MG PO TABS: 1.0000 | ORAL_TABLET | Freq: Two times a day (BID) | ORAL | 0 refills | Status: DC

## 2021-03-23 MED ORDER — AMOXICILLIN-POT CLAVULANATE 875-125 MG PO TABS
1.0000 | ORAL_TABLET | Freq: Two times a day (BID) | ORAL | Status: DC
  Administered 2021-03-23: 1 via ORAL
  Filled 2021-03-23: qty 1

## 2021-03-23 MED ORDER — ACETAMINOPHEN 325 MG PO TABS
650.0000 mg | ORAL_TABLET | Freq: Four times a day (QID) | ORAL | Status: DC | PRN
  Administered 2021-03-23: 650 mg via ORAL
  Filled 2021-03-23: qty 2

## 2021-03-23 NOTE — Care Management Important Message (Signed)
Important Message  Patient Details  Name: Daniel Holden MRN: [id number] Date of Birth: 1945/11/12   Medicare Important Message Given:  Yes     [name] 03/23/2021, 4:05 PM

## 2021-03-23 NOTE — Progress Notes (Signed)
Requesting Tylenol for headache; order had expired previous date; Rachael Fee, NP notified; waiting for acknowledgement. Barbaraann Faster, RN 1:58 AM 03/23/2021

## 2021-03-23 NOTE — Discharge Summary (Signed)
Physician Discharge Summary  IZZAK Holden XNA:[id number] DOB: 01-15-1946 DOA: 03/19/2021  PCP: Cletis Athens, MD  Admit date: 03/19/2021 Discharge date: 03/23/2021  Admitted From: Home Disposition: Home  Recommendations for Outpatient Follow-up:  Patient will follow up with ENT, Dr. Richardson Landry next week Further infectious work-up will be followed up by Dr. Delaine Lame Patient is not to use CPAP until cleared by ENT    Discharge Condition: Stable CODE STATUS: Full code Diet recommendation: Heart healthy  Brief/Interim Summary: Daniel Holden is a 75 y.o. male with medical history significant for glucose intolerance, A1c of 6.4 in July 2021, hypertension, hyperlipidemia, obstructive sleep apnea, CAD, on CPAP nightly, pulmonary emphysema, obesity class I, anxiety, diabetic neuropathy, GERD, insomnia, who presents emergency department for chief concerns of headache and right-sided facial numbness. Work up with CT and MRI revealed opacification of right maxillary sinus, right periorbital cellulitis.  Case was discussed with ophthalmology who recommended IV antibiotics and MRI orbits.  They recommended ENT consultation for debridement.  ENT was consulted.  Numerous providers have called Daniel Holden, [facility] for transfer.  [facility] and [facility] have declined to accept patient.  [facility] accepted but there were no beds available 9/25 or 9/26.  Patient was ultimately taken to the OR by Dr. Richardson Landry on 9/26.  Infectious disease was consulted for antibiotic recommendations.  Discharge Diagnoses:  Principal Problem:   Maxillary sinusitis Active Problems:   OSA on CPAP   Anxiety   Class 1 obesity due to excess calories with serious comorbidity and body mass index (BMI) of 32.0 to 32.9 in adult   Neuropathy   Primary hypertension   Hepatic steatosis   Periorbital cellulitis  Right maxillary sinusitis, orbital cellulitis -MRI orbits: The right maxillary sinus is completely opacified with complex  material. 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 Associated thinning and dehiscence at the right orbital floor with inflammatory changes and phlegmon involving the inferior postseptal bony right orbit. Right lamina papyracea thinned and possibly dehisced as well. No discrete intraorbital fluid collection or abscess. Associated mild right-sided proptosis. Additional thinning and dehiscence elsewhere about the right maxillary sinus, with evidence for invasion into the right retro antral fat. Mild soft tissue swelling about the right periorbital soft tissues, suggesting associated periorbital cellulitis. -Status post right endoscopic maxillary antrostomy with tissue removal, right anterior ethmoidectomy by Dr. Richardson Landry 9/26. Did not see invasive hyphae in frozen sections  -Tissue biopsies pending. Treat as bacterial infection superimposed on allergic non-invasive fungal sinusitis   -Initially treated with cefepime, but then transition to Unasyn when it was noted that he had MSSA, ID following.  Antibiotics subsequently transition to Augmentin for the next 2 weeks.  He will follow-up with ENT in 1 week -Discussed with ophthalmology for right eye discomfort, recommended for erythromycin ointment 2-3 times daily for couple of days, follow up with [facility] after discharge.  Overall eye symptoms have resolved   Diabetes mellitus, well controlled, new diagnosis -Hemoglobin A1c 6.6 -Consult diabetic coordinator -[facility]  -This can be followed up by primary care physician   OSA -Hold off on CPAP use for several weeks until cleared by ENT   HTN -Continue Norvasc, lotensin, lopressor     HLD -Continue Lipitor   CAD -Continue Ranexa  -Continue aspirin    Anxiety  -Continue Xanax  Discharge Instructions  Discharge Instructions     Diet - low sodium heart healthy   Complete by: As directed    Increase activity slowly   Complete by: As directed    No wound care   Complete by: As directed    Other Restrictions   Complete by: As directed    Do not use CPAP until cleared by ENT      Allergies as of 03/23/2021       Reactions   Dilaudid [hydromorphone Hcl] Nausea And Vomiting        Medication List     TAKE these medications    ALPRAZolam 0.25 MG tablet Commonly known as: XANAX Take 1 tablet (0.25 mg total) by mouth at bedtime as needed for sleep.   amLODipine 5 MG tablet Commonly known as: NORVASC Take 1 tablet (5 mg total) by mouth daily.   amoxicillin-clavulanate 875-125 MG tablet Commonly known as: AUGMENTIN Take 1 tablet by mouth every 12 (twelve) hours.   aspirin 81 MG EC tablet Take 81 mg by mouth daily.   atorvastatin 80 MG tablet Commonly known as: LIPITOR Take 1 tablet (80 mg total) by mouth daily.   benazepril 20 MG tablet Commonly known as: LOTENSIN Take 1 tablet (20 mg total) by mouth daily.   Cholecalciferol 250 MCG (10000 UT) Tabs Take 1,000 Units by mouth daily.   cyanocobalamin 1000 MCG tablet Take 1,000 mcg by mouth daily.   fluticasone 50 MCG/ACT nasal spray Commonly known as: FLONASE Place 2 sprays into both nostrils daily.   gabapentin 300 MG capsule Commonly known as: NEURONTIN Take 300 mg by mouth 2 (two) times daily.   melatonin 3 MG Tabs tablet Take 10 mg by mouth at bedtime.   metoprolol tartrate 50 MG tablet Commonly known as: LOPRESSOR Take 1 tablet (50 mg total) by mouth 2 (two) times daily.   naproxen sodium 220 MG tablet Commonly known as: ALEVE Take 1 tablet (220 mg total) by mouth 2 (two) times daily as needed. What changed:  when to take this reasons to take this   omeprazole 20 MG tablet Commonly known as: PRILOSEC OTC Take 10 mg by mouth daily.    ranolazine 1000 MG SR tablet Commonly known as: Ranexa Take 1 tablet (1,000 mg total) by mouth 2 (two) times daily.        Follow-up Information     Clyde Canterbury, MD Follow up.   Specialty: Otolaryngology Why: call for appointment next week Contact information: [place] [address] [phone]                Allergies  Allergen Reactions   Dilaudid [Hydromorphone Hcl] Nausea And Vomiting    Consultations: ENT, Dr. Richardson Landry Infectious disease, Dr. Delaine Lame   Procedures/Studies: CT HEAD WO CONTRAST  Result Date: 03/19/2021 CLINICAL DATA:  headache EXAM: CT HEAD WITHOUT CONTRAST TECHNIQUE: Contiguous axial images were obtained from the base of the skull through the vertex without intravenous contrast. COMPARISON:  None. FINDINGS: Brain: No evidence of acute infarction, hemorrhage, hydrocephalus, extra-axial collection or mass lesion/mass effect. Vascular: No hyperdense vessel or unexpected calcification. Skull: Normal. Negative for fracture or focal lesion. Sinuses/Orbits: There is complete opacification of the RIGHT maxillary sinus in the majority of the RIGHT-sided ethmoid air cells extending into the hypoplastic RIGHT frontal sinus. There is fat stranding noted along the inferior orbital rim along the maxillary sinus extending into the inferior orbit (series 4, image 16). No definitive intracranial extension is seen, but  evaluation is limited. Mild mucosal thickening of the LEFT maxillary sinus. Other: None IMPRESSION: 1. There is complete opacification of the RIGHT maxillary sinus extending into the RIGHT ethmoid air cells and the hypoplastic RIGHT frontal sinus. There is asymmetric fat stranding noted in the inferior RIGHT orbit. This could reflect a periosteal abscess. As such, recommend further dedicated orbit evaluation with CT or MRI with contrast. Electronically Signed   By: Valentino Saxon M.D.   On: 03/19/2021 18:25   CT  MAXILLOFACIAL WO CONTRAST  Result Date: 03/19/2021 CLINICAL DATA:  Initial evaluation for concern of invasive right maxillary sinusitis with right orbital involvement. EXAM: CT MAXILLOFACIAL WITHOUT CONTRAST TECHNIQUE: Multidetector CT imaging of the maxillofacial structures was performed. Multiplanar CT image reconstructions were also generated. COMPARISON:  Comparison made with previous head CT and orbital MRI from 03/19/2021. FINDINGS: Orbits/Sinuses: Right maxillary sinus is completely opacified with complex heterogeneous material. The right ethmoidal air cells are also largely opacified. Opacification of the hypoplastic right frontal sinus and frontoethmoidal recess. Mucosal thickening within the right sphenoid sinus. Thin section bone window algorithm was performed through the sinuses. Images demonstrate multifocal areas of osseous thinning and dehiscence about the right orbital floor (series 6, image 54), confirming findings seen on prior studies. Additionally, the anterior and posterior walls of the right maxillary sinus are also thin with scattered areas of dehiscence (series 3, image 58). Hazy inflammatory stranding seen within the right retro antral fat, consistent with invasive sinusitis (series 2, image 53). The right pre maxillary soft tissues are relatively preserved at this time. Stranding with inflammatory changes seen involving the extraconal fat of the inferior right orbit, again concerning for postseptal cellulitis related to invasive sinusitis (series 5, image 30). Mild mass effect on the adjacent right inferior rectus muscle which is slightly bowed superiorly (series 5, image 31). Additionally, there is possible thinning and dehiscence at the inferior right lamina papyracea (series 6, image 43). The right lamina papyracea is slightly bowed laterally into the bony right orbit due to underlying sinus disease (series 2, image 92). No discrete abscess or drainable fluid collection seen on this  noncontrast CT. Remainder of the right orbital fat is relatively maintained at this time. Mild right-sided proptosis again noted. Right globe and optic nerve otherwise unremarkable. Extra-ocular muscles otherwise unremarkable. Lacrimal gland normal. No other abnormality about the right lacrimal apparatus. Contralateral left globe and orbit within normal limits. Other than mild mucosal thickening about the left maxillary sinus, the remainder of the left-sided paranasal sinuses are otherwise relatively clear without abnormality. Mastoid air cells and middle ear cavities are well pneumatized and free of fluid. Soft tissues: Probable mild soft tissue swelling about the right periorbital soft tissues, suggesting possible mild periorbital cellulitis. No discrete soft tissue abscess or collection. Limited intracranial: Unremarkable. No visible evidence for intracranial spread of infection at this time. IMPRESSION: 1. 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 Associated thinning and dehiscence at the right orbital floor with inflammatory changes and phlegmon involving the inferior postseptal bony right orbit. Right lamina papyracea thinned and possibly dehisced as well. No discrete intraorbital fluid collection or abscess. Associated mild right-sided proptosis. Additional thinning and dehiscence elsewhere about the right maxillary sinus, with evidence for invasion into the right retro antral fat. 2. Mild soft tissue swelling about the right periorbital soft tissues, suggesting associated periorbital cellulitis. Critical results discussed by telephone at the time of interpretation on  03/19/2021 at 10:49 pm to provider AMY COX , who verbally acknowledged these results. Per Dr. Tobie Poet, emergent ENT consultation has already been obtained. Electronically Signed   By: Jeannine Boga  M.D.   On: 03/19/2021 23:01   MR ORBITS W WO CONTRAST  Result Date: 03/19/2021 CLINICAL DATA:  Pain in right eye. Additional history provided: Patient reports headache on right side of head for the past 2-3 weeks, right-sided facial numbness, occasional blurry vision. EXAM: MRI OF THE ORBITS WITHOUT AND WITH CONTRAST TECHNIQUE: Multiplanar, multi-echo pulse sequences of the orbits and surrounding structures were acquired including fat saturation techniques, before and after intravenous contrast administration. CONTRAST:  7.78mL GADAVIST GADOBUTROL 1 MMOL/ML IV SOLN COMPARISON:  Head CT 03/19/2021. FINDINGS: Orbits: Osseous thinning with foci of possible dehiscence within the right orbital floor/roof of right maxillary sinus were better appreciated on the head CT performed earlier today. There is adjacent abnormal T2 hyperintense enhancing soft tissue within the inferior extraconal right orbit measuring up to 5 mm in thickness (for instance as seen on series 13, image 29) (series 7, image 26). This is highly concerning for extension of infection from the right maxillary sinus with resultant postseptal orbital cellulitis/phlegmon at this site. This abnormal soft tissue abuts the right inferior rectus muscle. The globes are normal in size and contour. The extraocular muscles and optic nerve sheath complexes are otherwise symmetric and unremarkable. Subtle right proptosis. Visualized sinuses: The right maxillary sinus is completely opacified with complex material. Osseous thinning with foci of possible dehiscence in the right orbital floor/roof of right maxillary sinus, better appreciated on the head CT performed earlier today. Near complete T2 hyperintense opacification of the anterior right ethmoid air cells. Trace mucosal thickening elsewhere within the ethmoid sinuses, and within the visualized left maxillary sinus. Soft tissues: T2 hyperintense signal abnormality and enhancement within the right periorbital soft  tissues. Limited intracranial: No evidence of acute intracranial abnormality within the field of view. These results were called by telephone at the time of interpretation on 03/19/2021 at 8:25 pm to provider Dr. Joni Fears, who verbally acknowledged these results. IMPRESSION: The right maxillary sinus is completely opacified with complex material. Associated osseous thinning and possible foci of dehiscence in the right orbital floor/roof of maxillary sinus, better appreciated on the head CT performed earlier today. There is adjacent abnormal enhancing soft tissue within the inferior extraconal right orbit, measuring up to 5 mm in thickness. This is favored to reflect extension of infection from the right maxillary sinus with resultant right post-septal orbital cellulitis/phlegmon. This abnormal enhancing soft tissue abuts the right inferior rectus muscle. Subtle right proptosis. Signal abnormality and enhancement within the right periorbital soft tissues, likely reflecting a component of periorbital cellulitis. Additional paranasal sinus disease, as described. Most notably, there is severe partial opacification of the anterior right ethmoid air cells (overall right ostiomeatal unit obstructive pattern). ENT and ophthalmology consultation recommended. Electronically Signed   By: Kellie Simmering D.O.   On: 03/19/2021 20:31      Subjective: Feels well, no eye pain, sinus pain or changes in vision.  Discharge Exam: Vitals:   03/22/21 2142 03/22/21 2144 03/23/21 0416 03/23/21 0808  BP: 127/69 127/69 (!) 105/58 130/66  Pulse: 63 63 (!) 57 64  Resp:   16 17  Temp:   98.1 F (36.7 C) (!) 97.4 F (36.3 C)  TempSrc:   Oral Oral  SpO2:   96% 99%  Weight:      Height:  General: Pt is alert, awake, not in acute distress Cardiovascular: RRR, S1/S2 +, no rubs, no gallops Respiratory: CTA bilaterally, no wheezing, no rhonchi Abdominal: Soft, NT, ND, bowel sounds + Extremities: no edema, no  cyanosis    The results of significant diagnostics from this hospitalization (including imaging, microbiology, ancillary and laboratory) are listed below for reference.     Microbiology: Recent Results (from the past 240 hour(s))  MRSA Next Gen by PCR, Nasal     Status: None   Collection Time: 03/19/21  9:54 PM   Specimen: Nasopharyngeal Swab; Nasal Swab  Result Value Ref Range Status   MRSA by PCR Next Gen NOT DETECTED NOT DETECTED Final    Comment: (NOTE) The GeneXpert MRSA Assay (FDA approved for NASAL specimens only), is one component of a comprehensive MRSA colonization surveillance program. It is not intended to diagnose MRSA infection nor to guide or monitor treatment for MRSA infections. Test performance is not FDA approved in patients less than 76 years old. Performed at [facility], [address] [place], [place] [address]   Resp Panel by RT-PCR (Flu A&B, Covid) Nasopharyngeal Swab     Status: None   Collection Time: 03/19/21 10:18 PM   Specimen: Nasopharyngeal Swab; Nasopharyngeal(NP) swabs in vial transport medium  Result Value Ref Range Status   SARS Coronavirus 2 by RT PCR NEGATIVE NEGATIVE Final    Comment: (NOTE) SARS-CoV-2 target nucleic acids are NOT DETECTED.  The SARS-CoV-2 RNA is generally detectable in upper respiratory specimens during the acute phase of infection. The lowest concentration of SARS-CoV-2 viral copies this assay can detect is 138 copies/mL. A negative result does not preclude SARS-Cov-2 infection and should not be used as the sole basis for treatment or other patient management decisions. A negative result may occur with  improper specimen collection/handling, submission of specimen other than nasopharyngeal swab, presence of viral mutation(s) within the areas targeted by this assay, and inadequate number of viral copies(<138 copies/mL). A negative result must be combined with clinical observations, patient history, and  epidemiological information. The expected result is Negative.  Fact Sheet for Patients:  EntrepreneurPulse.com.au  Fact Sheet for Healthcare Providers:  IncredibleEmployment.be  This test is no t yet approved or cleared by the [place] FDA and  has been authorized for detection and/or diagnosis of SARS-CoV-2 by FDA under an Emergency Use Authorization (EUA). This EUA will remain  in effect (meaning this test can be used) for the duration of the COVID-19 declaration under Section 564(b)(1) of the Act, 21 U.S.C.section 360bbb-3(b)(1), unless the authorization is terminated  or revoked sooner.       Influenza A by PCR NEGATIVE NEGATIVE Final   Influenza B by PCR NEGATIVE NEGATIVE Final    Comment: (NOTE) The Xpert Xpress SARS-CoV-2/FLU/RSV plus assay is intended as an aid in the diagnosis of influenza from Nasopharyngeal swab specimens and should not be used as a sole basis for treatment. Nasal washings and aspirates are unacceptable for Xpert Xpress SARS-CoV-2/FLU/RSV testing.  Fact Sheet for Patients: EntrepreneurPulse.com.au  Fact Sheet for Healthcare Providers: IncredibleEmployment.be  This test is not yet approved or cleared by the [place] FDA and has been authorized for detection and/or diagnosis of SARS-CoV-2 by FDA under an Emergency Use Authorization (EUA). This EUA will remain in effect (meaning this test can be used) for the duration of the COVID-19 declaration under Section 564(b)(1) of the Act, 21 U.S.C. section 360bbb-3(b)(1), unless the authorization is terminated or revoked.  Performed at [facility], [place], [place] [address]   CULTURE, BLOOD (ROUTINE X 2) w Reflex to ID Panel     Status: None (Preliminary result)   Collection Time: 03/20/21 12:58 AM   Specimen: BLOOD  Result Value Ref Range Status   Specimen Description BLOOD LEFT ARM  Final    Special Requests   Final    BOTTLES DRAWN AEROBIC AND ANAEROBIC Blood Culture adequate volume   Culture   Final    NO GROWTH 3 DAYS Performed at [facility], [address], [place], [place] [address]    Report Status PENDING  Incomplete  CULTURE, BLOOD (ROUTINE X 2) w Reflex to ID Panel     Status: None (Preliminary result)   Collection Time: 03/20/21 12:58 AM   Specimen: BLOOD  Result Value Ref Range Status   Specimen Description BLOOD LEFT HAND  Final   Special Requests   Final    BOTTLES DRAWN AEROBIC AND ANAEROBIC Blood Culture adequate volume   Culture   Final    NO GROWTH 3 DAYS Performed at [facility], [address], [place], [place] [address]    Report Status PENDING  Incomplete  Aerobic/Anaerobic Culture w Gram Stain (surgical/deep wound)     Status: None (Preliminary result)   Collection Time: 03/20/21  8:14 AM   Specimen: PATH Sinus Contents/Nasal Polyps  Result Value Ref Range Status   Specimen Description   Final    MAXILLARY SINUS Performed at [facility], [address], [place], [place] [address]    Special Requests   Final    NONE Performed at [facility], [address] [place], [place] [address]    Gram Stain   Final    NO ORGANISMS SEEN SQUAMOUS EPITHELIAL CELLS PRESENT FEW WBC PRESENT, PREDOMINANTLY MONONUCLEAR FEW GRAM POSITIVE COCCI MODERATE HYPHAL ELEMENTS SEEN Performed at [facility], [place] [address], [place], [place] [address]    Culture   Final    FEW STAPHYLOCOCCUS AUREUS NO ANAEROBES ISOLATED; CULTURE IN PROGRESS FOR 5 DAYS    Report Status PENDING  Incomplete   Organism ID, Bacteria STAPHYLOCOCCUS AUREUS  Final      Susceptibility   Staphylococcus aureus - MIC*    CIPROFLOXACIN >=8 RESISTANT Resistant     ERYTHROMYCIN RESISTANT Resistant     GENTAMICIN <=0.5 SENSITIVE Sensitive     OXACILLIN 0.5 SENSITIVE Sensitive     TETRACYCLINE <=1 SENSITIVE Sensitive     VANCOMYCIN 1 SENSITIVE  Sensitive     TRIMETH/SULFA <=10 SENSITIVE Sensitive     CLINDAMYCIN RESISTANT Resistant     RIFAMPIN <=0.5 SENSITIVE Sensitive     Inducible Clindamycin POSITIVE Resistant     * FEW STAPHYLOCOCCUS AUREUS  Fungus Stain     Status: None   Collection Time: 03/20/21  8:14 AM   Specimen: PATH Sinus Contents/Nasal Polyps  Result Value Ref Range Status   FUNGUS STAIN Final report  Final    Comment: (NOTE) Performed At: [facility] [address] [place], [place] [id number] Rush Farmer MD [id number]    Fungal Source MAXILLARY SINUS  Final    Comment: Performed at [facility], [address] [place], [place] [address]  Fungal Stain reflex     Status: None   Collection Time: 03/20/21  8:14 AM  Result Value Ref Range Status   Fungal stain result 1 Comment  Final    Comment: (NOTE) KOH/Calcofluor preparation:  no fungus observed. Performed At: [facility] [place], [place] [id number] Rush Farmer MD YD:[id number]      Labs: BNP (last 3 results) No results for input(s): BNP in the last 8760 hours. Basic Metabolic Panel: Recent Labs  Lab 03/19/21 1544 03/20/21 0403 03/21/21 0621  NA 123* 128* 131*  K 4.3 4.3 4.3  CL 88* 95* 97*  CO2 24 26 23   GLUCOSE 179* 150* 186*  BUN 21 16 22   CREATININE 0.88 0.89 0.85  CALCIUM 9.6 9.2 9.2   Liver Function Tests: Recent Labs  Lab 03/19/21 1544  AST 14*  ALT 7  ALKPHOS 66  BILITOT 1.4*  PROT 7.9  ALBUMIN 4.2   No results for input(s): LIPASE, AMYLASE in the last 168 hours. No results for input(s): AMMONIA in the last 168 hours. CBC: Recent Labs  Lab 03/19/21 1544 03/20/21 0403 03/21/21 0621 03/22/21 0631  WBC 12.4* 9.2 17.8* 9.2  NEUTROABS 9.0*  --   --   --   HGB 12.4* 11.6* 11.5* 11.0*  HCT 34.1* 31.8* 32.3* 31.4*  MCV 97.4 99.1 100.0 100.3*  PLT 292 244 284 267   Cardiac Enzymes: No results for input(s): CKTOTAL, CKMB, CKMBINDEX, TROPONINI in the last 168  hours. BNP: Invalid input(s): POCBNP CBG: Recent Labs  Lab 03/22/21 1654 03/22/21 2101 03/23/21 0810 03/23/21 1203 03/23/21 1700  GLUCAP 135* 200* 155* 150* 127*   D-Dimer No results for input(s): DDIMER in the last 72 hours. Hgb A1c No results for input(s): HGBA1C in the last 72 hours. Lipid Profile No results for input(s): CHOL, HDL, LDLCALC, TRIG, CHOLHDL, LDLDIRECT in the last 72 hours. Thyroid function studies No results for input(s): TSH, T4TOTAL, T3FREE, THYROIDAB in the last 72 hours.  Invalid input(s): FREET3 Anemia work up No results for input(s): VITAMINB12, FOLATE, FERRITIN, TIBC, IRON, RETICCTPCT in the last 72 hours. Urinalysis    Component Value Date/Time   COLORURINE YELLOW (A) 04/22/2017 1639   APPEARANCEUR HAZY (A) 04/22/2017 1639   APPEARANCEUR Clear 03/10/2012 1014   LABSPEC 1.018 04/22/2017 1639   LABSPEC 1.011 03/10/2012 1014   PHURINE 5.0 04/22/2017 1639   GLUCOSEU NEGATIVE 04/22/2017 1639   GLUCOSEU Negative 03/10/2012 1014   HGBUR SMALL (A) 04/22/2017 1639   BILIRUBINUR NEGATIVE 04/22/2017 1639   BILIRUBINUR Negative 03/10/2012 1014   KETONESUR 5 (A) 04/22/2017 1639   PROTEINUR 100 (A) 04/22/2017 1639   NITRITE NEGATIVE 04/22/2017 1639   LEUKOCYTESUR NEGATIVE 04/22/2017 1639   LEUKOCYTESUR Negative 03/10/2012 1014   Sepsis Labs Invalid input(s): PROCALCITONIN,  WBC,  LACTICIDVEN Microbiology Recent Results (from the past 240 hour(s))  MRSA Next Gen by PCR, Nasal     Status: None   Collection Time: 03/19/21  9:54 PM   Specimen: Nasopharyngeal Swab; Nasal Swab  Result Value Ref Range Status   MRSA by PCR Next Gen NOT DETECTED NOT DETECTED Final    Comment: (NOTE) The GeneXpert MRSA Assay (FDA approved for NASAL specimens only), is one component of a comprehensive MRSA colonization surveillance program. It is not intended to diagnose MRSA infection nor to guide or monitor treatment for MRSA infections. Test performance is not FDA  approved in patients less than 73 years old. Performed at [facility], [address] [place], [place] [address]   Resp Panel by RT-PCR (Flu A&B, Covid) Nasopharyngeal Swab     Status: None   Collection Time: 03/19/21 10:18 PM   Specimen: Nasopharyngeal Swab; Nasopharyngeal(NP) swabs in vial transport medium  Result Value Ref Range Status  SARS Coronavirus 2 by RT PCR NEGATIVE NEGATIVE Final    Comment: (NOTE) SARS-CoV-2 target nucleic acids are NOT DETECTED.  The SARS-CoV-2 RNA is generally detectable in upper respiratory specimens during the acute phase of infection. The lowest concentration of SARS-CoV-2 viral copies this assay can detect is 138 copies/mL. A negative result does not preclude SARS-Cov-2 infection and should not be used as the sole basis for treatment or other patient management decisions. A negative result may occur with  improper specimen collection/handling, submission of specimen other than nasopharyngeal swab, presence of viral mutation(s) within the areas targeted by this assay, and inadequate number of viral copies(<138 copies/mL). A negative result must be combined with clinical observations, patient history, and epidemiological information. The expected result is Negative.  Fact Sheet for Patients:  EntrepreneurPulse.com.au  Fact Sheet for Healthcare Providers:  IncredibleEmployment.be  This test is no t yet approved or cleared by the [place] FDA and  has been authorized for detection and/or diagnosis of SARS-CoV-2 by FDA under an Emergency Use Authorization (EUA). This EUA will remain  in effect (meaning this test can be used) for the duration of the COVID-19 declaration under Section 564(b)(1) of the Act, 21 U.S.C.section 360bbb-3(b)(1), unless the authorization is terminated  or revoked sooner.       Influenza A by PCR NEGATIVE NEGATIVE Final   Influenza B by PCR NEGATIVE NEGATIVE Final     Comment: (NOTE) The Xpert Xpress SARS-CoV-2/FLU/RSV plus assay is intended as an aid in the diagnosis of influenza from Nasopharyngeal swab specimens and should not be used as a sole basis for treatment. Nasal washings and aspirates are unacceptable for Xpert Xpress SARS-CoV-2/FLU/RSV testing.  Fact Sheet for Patients: EntrepreneurPulse.com.au  Fact Sheet for Healthcare Providers: IncredibleEmployment.be  This test is not yet approved or cleared by the [place] FDA and has been authorized for detection and/or diagnosis of SARS-CoV-2 by FDA under an Emergency Use Authorization (EUA). This EUA will remain in effect (meaning this test can be used) for the duration of the COVID-19 declaration under Section 564(b)(1) of the Act, 21 U.S.C. section 360bbb-3(b)(1), unless the authorization is terminated or revoked.  Performed at [facility], [address] [place], [place] [address]   CULTURE, BLOOD (ROUTINE X 2) w Reflex to ID Panel     Status: None (Preliminary result)   Collection Time: 03/20/21 12:58 AM   Specimen: BLOOD  Result Value Ref Range Status   Specimen Description BLOOD LEFT ARM  Final   Special Requests   Final    BOTTLES DRAWN AEROBIC AND ANAEROBIC Blood Culture adequate volume   Culture   Final    NO GROWTH 3 DAYS Performed at [facility], [address], [place], [place] [address]    Report Status PENDING  Incomplete  CULTURE, BLOOD (ROUTINE X 2) w Reflex to ID Panel     Status: None (Preliminary result)   Collection Time: 03/20/21 12:58 AM   Specimen: BLOOD  Result Value Ref Range Status   Specimen Description BLOOD LEFT HAND  Final   Special Requests   Final    BOTTLES DRAWN AEROBIC AND ANAEROBIC Blood Culture adequate volume   Culture   Final    NO GROWTH 3 DAYS Performed at [facility], [address] [place], [place] [address]    Report Status PENDING  Incomplete   Aerobic/Anaerobic Culture w Gram Stain (surgical/deep wound)     Status: None (Preliminary result)   Collection Time: 03/20/21  8:14  AM   Specimen: PATH Sinus Contents/Nasal Polyps  Result Value Ref Range Status   Specimen Description   Final    MAXILLARY SINUS Performed at [facility], [address] [place], [place] [address]    Special Requests   Final    NONE Performed at [facility], [address] [place], [place] [address]    Gram Stain   Final    NO ORGANISMS SEEN SQUAMOUS EPITHELIAL CELLS PRESENT FEW WBC PRESENT, PREDOMINANTLY MONONUCLEAR FEW GRAM POSITIVE COCCI MODERATE HYPHAL ELEMENTS SEEN Performed at [facility], [place] [address], [place], [place] [address]    Culture   Final    FEW STAPHYLOCOCCUS AUREUS NO ANAEROBES ISOLATED; CULTURE IN PROGRESS FOR 5 DAYS    Report Status PENDING  Incomplete   Organism ID, Bacteria STAPHYLOCOCCUS AUREUS  Final      Susceptibility   Staphylococcus aureus - MIC*    CIPROFLOXACIN >=8 RESISTANT Resistant     ERYTHROMYCIN RESISTANT Resistant     GENTAMICIN <=0.5 SENSITIVE Sensitive     OXACILLIN 0.5 SENSITIVE Sensitive     TETRACYCLINE <=1 SENSITIVE Sensitive     VANCOMYCIN 1 SENSITIVE Sensitive     TRIMETH/SULFA <=10 SENSITIVE Sensitive     CLINDAMYCIN RESISTANT Resistant     RIFAMPIN <=0.5 SENSITIVE Sensitive     Inducible Clindamycin POSITIVE Resistant     * FEW STAPHYLOCOCCUS AUREUS  Fungus Stain     Status: None   Collection Time: 03/20/21  8:14 AM   Specimen: PATH Sinus Contents/Nasal Polyps  Result Value Ref Range Status   FUNGUS STAIN Final report  Final    Comment: (NOTE) Performed At: [facility] [address] [place], [place] [id number] Rush Farmer MD QU:[id number]    Fungal Source MAXILLARY SINUS  Final    Comment: Performed at [facility], [address] [place], [place] [address]  Fungal Stain reflex     Status: None   Collection Time: 03/20/21  8:14 AM   Result Value Ref Range Status   Fungal stain result 1 Comment  Final    Comment: (NOTE) KOH/Calcofluor preparation:  no fungus observed. Performed At: [facility] [place], [place] [id number] Rush Farmer MD FR:[id number]      Time coordinating discharge: 103mins  SIGNED:   Kathie Dike, MD  [facility] 03/23/2021, 8:51 PM   If 7PM-7AM, please contact night-coverage [email or web address]

## 2021-03-23 NOTE — Progress Notes (Signed)
Rachael Fee, NP acknowledged; new order written for Tylenol. Barbaraann Faster, RN 2:05 AM 03/23/2021

## 2021-03-23 NOTE — Progress Notes (Signed)
   03/23/21 1230  Clinical Encounter Type  Visited With Patient  Visit Type Initial;Spiritual support;Social support  Referral From Chaplain  Consult/Referral To [facility] checked in with PT to provided pastoral care. PT was able to express his emotions, and share his lived theology (God is in control). PT spoke of being a little frustrated that he had not yet been released. Chaplain normalized his emotions. Ministered with a calm presence and reflective listening.   [place].

## 2021-03-24 LAB — SURGICAL PATHOLOGY

## 2021-03-24 LAB — FUNGITELL, SERUM: Fungitell Result: 60 pg/mL (ref <80)

## 2021-03-25 LAB — CULTURE, BLOOD (ROUTINE X 2)
Culture: NO GROWTH
Culture: NO GROWTH
Special Requests: ADEQUATE
Special Requests: ADEQUATE

## 2021-03-25 LAB — AEROBIC/ANAEROBIC CULTURE W GRAM STAIN (SURGICAL/DEEP WOUND): Gram Stain: NONE SEEN

## 2021-03-27 LAB — MISC LABCORP TEST (SEND OUT): Labcorp test code: 164284

## 2021-03-29 ENCOUNTER — Encounter: Payer: Medicare Other | Attending: Cardiology

## 2021-03-29 ENCOUNTER — Other Ambulatory Visit: Payer: Self-pay

## 2021-03-29 ENCOUNTER — Encounter: Payer: Self-pay | Admitting: *Deleted

## 2021-03-29 DIAGNOSIS — I208 Other forms of angina pectoris: Secondary | ICD-10-CM

## 2021-03-29 NOTE — Progress Notes (Signed)
Daily Session Note  Patient Details  Name: Daniel Holden MRN: [id number] Date of Birth: 1945/12/21 Referring Provider:   Flowsheet Row Cardiac Rehab from 03/09/2021 in [facility] Cardiac and Pulmonary Rehab  Referring Provider Fransico Him MD       Encounter Date: 03/29/2021  Check In:  Session Check In - 03/29/21 0759       Check-In   Supervising physician immediately available to respond to emergencies See telemetry face sheet for immediately available ER MD    Location [facility]-Cardiac & Pulmonary Rehab    Staff Present Birdie Sons, MPA, RN;Melissa Enfield, RDN, Rowe Pavy, BA, ACSM CEP, Exercise Physiologist    Virtual Visit No    Medication changes reported     No    Fall or balance concerns reported    No    Warm-up and Cool-down Performed on first and last piece of equipment    Resistance Training Performed Yes    VAD Patient? No    PAD/SET Patient? No      Pain Assessment   Currently in Pain? No/denies                Social History   Tobacco Use  Smoking Status Never  Smokeless Tobacco Never    Goals Met:  Independence with exercise equipment Exercise tolerated well No report of concerns or symptoms today Strength training completed today  Goals Unmet:  Not Applicable  Comments: Pt received clearance note to return to rehab. Pt able to follow exercise prescription today without complaint.  Will continue to monitor for progression.    Dr. Emily Filbert is Medical Director for [place].  Dr. Ottie Glazier is Medical Director for [facility] Pulmonary Rehabilitation.

## 2021-03-29 NOTE — Progress Notes (Signed)
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[place])  Patient's Home Medications on Admission:  Current Outpatient Medications:    ALPRAZolam (XANAX) 0.25 MG tablet, Take 1 tablet (0.25 mg total) by mouth at bedtime as needed for sleep., Disp: 30 tablet, Rfl: 0   amLODipine (NORVASC) 5 MG tablet, Take 1 tablet (5 mg total) by mouth daily., Disp: 30 tablet, Rfl: 11   amoxicillin-clavulanate (AUGMENTIN) 875-125 MG tablet, Take 1 tablet by mouth every 12 (twelve) hours., Disp: 28 tablet, Rfl: 0   aspirin 81 MG EC tablet, Take 81 mg by mouth daily., Disp: , Rfl:    atorvastatin (LIPITOR) 80 MG tablet, Take 1 tablet (80 mg total) by mouth daily., Disp: 90 tablet, Rfl: 3   benazepril (LOTENSIN) 20 MG tablet, Take 1 tablet (20 mg total) by mouth daily., Disp: 90 tablet, Rfl: 3   Cholecalciferol 250 MCG (10000 UT) TABS, Take 1,000 Units by mouth daily., Disp: , Rfl:    cyanocobalamin 1000 MCG tablet, Take 1,000 mcg by mouth daily., Disp: , Rfl:    fluticasone (FLONASE) 50 MCG/ACT nasal spray, Place 2 sprays into both nostrils daily. , Disp: , Rfl:    gabapentin (NEURONTIN) 300 MG capsule, Take 300 mg by mouth 2 (two) times daily., Disp: , Rfl:    melatonin 3 MG TABS tablet, Take 10 mg by mouth at bedtime., Disp: , Rfl:    metoprolol tartrate (LOPRESSOR) 50 MG tablet, Take 1 tablet (50 mg total) by mouth 2 (two) times daily., Disp: 180 tablet, Rfl: 3   naproxen sodium (ALEVE) 220 MG tablet, Take 1 tablet (220 mg total) by mouth 2 (two) times daily as needed., Disp: , Rfl:    omeprazole (PRILOSEC OTC) 20 MG tablet, Take  10 mg by mouth daily., Disp: , Rfl:    ranolazine (RANEXA) 1000 MG SR tablet, Take 1 tablet (1,000 mg total) by mouth 2 (two) times daily., Disp: 180 tablet, Rfl: 3  Past Medical History: Past Medical History:  Diagnosis Date   Allergy    Angina of effort ([place])    Anxiety    CAD (coronary artery disease), native coronary artery    coronary CTA showing aortic atherosclerosis with very high coronary Ca score at 4630 with severely calcified RCA and LAD > 70% and 50-69% prox to mid LCx.     Carpal tunnel syndrome    HLD (hyperlipidemia)    Hypertension    Lung nodule    Obesity    Pancreatitis 2015   Sleep apnea     Tobacco Use: Social History   Tobacco Use  Smoking Status Never  Smokeless Tobacco Never    Labs: Recent Review Flowsheet Data     Labs for ITP Cardiac and Pulmonary Rehab Latest Ref Rng & Units 09/27/2013 11/07/2020 03/19/2021   Cholestrol 100 - 199 mg/dL 83 104 -   LDLCALC 0 - 99 mg/dL 17 26 -   HDL >39 mg/dL 28(L) 58 -   Trlycerides 0 - 149 mg/dL 188 111 -   Hemoglobin A1c 4.8 - 5.6 % - - 6.6(H)  Exercise Target Goals: Exercise Program Goal: Individual exercise prescription set using results from initial 6 min walk test and THRR while considering  patient's activity barriers and safety.   Exercise Prescription Goal: Initial exercise prescription builds to 30-45 minutes a day of aerobic activity, 2-3 days per week.  Home exercise guidelines will be given to patient during program as part of exercise prescription that the participant will acknowledge.   Education: Aerobic Exercise: - Group verbal and visual presentation on the components of exercise prescription. Introduces F.I.T.T principle from ACSM for exercise prescriptions.  Reviews F.I.T.T. principles of aerobic exercise including progression. Written material given at graduation. Flowsheet Row Cardiac Rehab from 03/29/2021 in [facility] Cardiac and Pulmonary Rehab  Education need identified 03/09/21        Education: Resistance Exercise: - Group verbal and visual presentation on the components of exercise prescription. Introduces F.I.T.T principle from ACSM for exercise prescriptions  Reviews F.I.T.T. principles of resistance exercise including progression. Written material given at graduation.    Education: Exercise & Equipment Safety: - Individual verbal instruction and demonstration of equipment use and safety with use of the equipment. Flowsheet Row Cardiac Rehab from 03/29/2021 in [facility] Cardiac and Pulmonary Rehab  Education need identified 03/09/21  Date 03/09/21  Educator [place]  Instruction Review Code 1- Verbalizes Understanding       Education: Exercise Physiology & General Exercise Guidelines: - Group verbal and written instruction with models to review the exercise physiology of the cardiovascular system and associated critical values. Provides general exercise guidelines with specific guidelines to those with heart or lung disease.    Education: Flexibility, Balance, Mind/Body Relaxation: - Group verbal and visual presentation with interactive activity on the components of exercise prescription. Introduces F.I.T.T principle from ACSM for exercise prescriptions. Reviews F.I.T.T. principles of flexibility and balance exercise training including progression. Also discusses the mind body connection.  Reviews various relaxation techniques to help reduce and manage stress (i.e. Deep breathing, progressive muscle relaxation, and visualization). Balance handout provided to take home. Written material given at graduation.   Activity Barriers & Risk Stratification:  Activity Barriers & Cardiac Risk Stratification - 03/09/21 1353       Activity Barriers & Cardiac Risk Stratification   Activity Barriers Left Knee Replacement;Chest Pain/Angina;Deconditioning    Cardiac Risk Stratification Moderate             6 Minute Walk:  6 Minute Walk     Row Name 03/09/21 0934          6 Minute Walk   Phase Initial     Distance 1200 feet     Walk Time 6 minutes     # of Rest Breaks 0     MPH 2.27     METS 2.08     RPE 8     Perceived Dyspnea  0     VO2 Peak 7.29     Symptoms No     Resting HR 54 bpm     Resting BP 98/62     Resting Oxygen Saturation  98 %     Exercise Oxygen Saturation  during 6 min walk 97 %     Max Ex. HR 73 bpm     Max Ex. BP 118/60     2 Minute Post BP 106/60              Oxygen Initial Assessment:   Oxygen Re-Evaluation:   Oxygen Discharge (Final Oxygen Re-Evaluation):   Initial Exercise Prescription:  Initial Exercise  Prescription - 03/09/21 0900       Date of Initial Exercise RX and Referring Provider   Date 03/09/21    Referring Provider Fransico Him MD      NuStep   Level 2    SPM 80    Minutes 15    METs 2.08      REL-XR   Level 1    Speed 50    Minutes 15    METs 2      Track   Laps 25   as tolerated   Minutes 15    METs 2.36      Prescription Details   Frequency (times per week) 3    Duration Progress to 30 minutes of continuous aerobic without signs/symptoms of physical distress      Intensity   THRR 40-80% of Max Heartrate 91-129    Ratings of Perceived Exertion 11-13    Perceived Dyspnea 0-4      Progression   Progression Continue progressive overload as per policy without signs/symptoms or physical distress.      Resistance Training   Training Prescription Yes    Weight 4 lb    Reps 10-15             Perform Capillary Blood Glucose checks as needed.  Exercise Prescription Changes:   Exercise Prescription Changes     Row Name 03/09/21 0900 03/20/21 1600           Response to Exercise   Blood Pressure (Admit) 98/62 124/72      Blood Pressure (Exercise) 118/60 122/64      Blood Pressure (Exit) 106/60 96/56      Heart Rate (Admit) 54 bpm 57 bpm      Heart Rate (Exercise) 73 bpm 80 bpm      Heart Rate (Exit) 55 bpm 64 bpm      Oxygen Saturation (Admit) 98 % --       Oxygen Saturation (Exercise) 97 % --      Oxygen Saturation (Exit) 97 % --      Rating of Perceived Exertion (Exercise) 8 13      Perceived Dyspnea (Exercise) 0 --      Symptoms none none      Comments walk test results --      Duration -- Continue with 30 min of aerobic exercise without signs/symptoms of physical distress.      Intensity -- THRR unchanged             Progression   Progression -- Continue to progress workloads to maintain intensity without signs/symptoms of physical distress.      Average METs -- 3.34             Resistance Training   Training Prescription -- Yes      Weight -- 4 lb      Reps -- 10-15             Interval Training   Interval Training -- No             NuStep   Level -- 4      Minutes -- 15      METs -- 3.5             Track   Laps -- 40      Minutes -- 15      METs -- 3.18             Oxygen   Maintain Oxygen  Saturation -- 88% or higher               Exercise Comments:   Exercise Comments     Row Name 03/29/21 0802           Exercise Comments Pt received clearance note to return to rehab. Pt able to follow exercise prescription today without complaint.  Will continue to monitor for progression.                Exercise Goals and Review:   Exercise Goals     Row Name 03/09/21 0933             Exercise Goals   Increase Physical Activity Yes       Intervention Provide advice, education, support and counseling about physical activity/exercise needs.;Develop an individualized exercise prescription for aerobic and resistive training based on initial evaluation findings, risk stratification, comorbidities and participant's personal goals.       Expected Outcomes Short Term: Attend rehab on a regular basis to increase amount of physical activity.;Long Term: Add in home exercise to make exercise part of routine and to increase amount of physical activity.;Long Term: Exercising regularly at least 3-5 days a week.        Increase Strength and Stamina Yes       Intervention Provide advice, education, support and counseling about physical activity/exercise needs.;Develop an individualized exercise prescription for aerobic and resistive training based on initial evaluation findings, risk stratification, comorbidities and participant's personal goals.       Expected Outcomes Short Term: Increase workloads from initial exercise prescription for resistance, speed, and METs.;Short Term: Perform resistance training exercises routinely during rehab and add in resistance training at home;Long Term: Improve cardiorespiratory fitness, muscular endurance and strength as measured by increased METs and functional capacity (6MWT)       Able to understand and use rate of perceived exertion (RPE) scale Yes       Intervention Provide education and explanation on how to use RPE scale       Expected Outcomes Short Term: Able to use RPE daily in rehab to express subjective intensity level;Long Term:  Able to use RPE to guide intensity level when exercising independently       Able to understand and use Dyspnea scale Yes       Intervention Provide education and explanation on how to use Dyspnea scale       Expected Outcomes Short Term: Able to use Dyspnea scale daily in rehab to express subjective sense of shortness of breath during exertion;Long Term: Able to use Dyspnea scale to guide intensity level when exercising independently       Knowledge and understanding of Target Heart Rate Range (THRR) Yes       Intervention Provide education and explanation of THRR including how the numbers were predicted and where they are located for reference       Expected Outcomes Short Term: Able to state/look up THRR;Long Term: Able to use THRR to govern intensity when exercising independently;Short Term: Able to use daily as guideline for intensity in rehab       Able to check pulse independently Yes       Intervention Provide education and  demonstration on how to check pulse in carotid and radial arteries.;Review the importance of being able to check your own pulse for safety during independent exercise       Expected Outcomes Short Term: Able to explain why pulse checking is important during independent  exercise;Long Term: Able to check pulse independently and accurately       Understanding of Exercise Prescription Yes       Intervention Provide education, explanation, and written materials on patient's individual exercise prescription       Expected Outcomes Short Term: Able to explain program exercise prescription;Long Term: Able to explain home exercise prescription to exercise independently                Exercise Goals Re-Evaluation :  Exercise Goals Re-Evaluation     Row Name 03/10/21 0754 03/20/21 1614           Exercise Goal Re-Evaluation   Exercise Goals Review Increase Physical Activity;Able to understand and use rate of perceived exertion (RPE) scale;Knowledge and understanding of Target Heart Rate Range (THRR);Understanding of Exercise Prescription;Increase Strength and Stamina;Able to check pulse independently Increase Physical Activity;Increase Strength and Stamina;Understanding of Exercise Prescription      Comments Reviewed RPE and dyspnea scales, THR and program prescription with pt today.  Pt voiced understanding and was given a copy of goals to take home. Liliane Channel is off to a good start in rehab.  He is on level 5 for the NuStep and doing 40 laps already.  We will continue to monitor his progress.      Expected Outcomes Short: Use RPE daily to regulate intensity. Long: Follow program prescription in THR. Short: Clearance to return to rehab regularly Long: Continue to follow program prescription               Discharge Exercise Prescription (Final Exercise Prescription Changes):  Exercise Prescription Changes - 03/20/21 1600       Response to Exercise   Blood Pressure (Admit) 124/72    Blood Pressure  (Exercise) 122/64    Blood Pressure (Exit) 96/56    Heart Rate (Admit) 57 bpm    Heart Rate (Exercise) 80 bpm    Heart Rate (Exit) 64 bpm    Rating of Perceived Exertion (Exercise) 13    Symptoms none    Duration Continue with 30 min of aerobic exercise without signs/symptoms of physical distress.    Intensity THRR unchanged      Progression   Progression Continue to progress workloads to maintain intensity without signs/symptoms of physical distress.    Average METs 3.34      Resistance Training   Training Prescription Yes    Weight 4 lb    Reps 10-15      Interval Training   Interval Training No      NuStep   Level 4    Minutes 15    METs 3.5      Track   Laps 40    Minutes 15    METs 3.18      Oxygen   Maintain Oxygen Saturation 88% or higher             Nutrition:  Target Goals: Understanding of nutrition guidelines, daily intake of sodium 1500mg , cholesterol 200mg , calories 30% from fat and 7% or less from saturated fats, daily to have 5 or more servings of fruits and vegetables.  Education: All About Nutrition: -Group instruction provided by verbal, written material, interactive activities, discussions, models, and posters to present general guidelines for heart healthy nutrition including fat, fiber, MyPlate, the role of sodium in heart healthy nutrition, utilization of the nutrition label, and utilization of this knowledge for meal planning. Follow up email sent as well. Written material given at graduation. [address] Cardiac Rehab  from 03/29/2021 in [facility] Cardiac and Pulmonary Rehab  Education need identified 03/09/21       Biometrics:  Pre Biometrics - 03/09/21 0924       Pre Biometrics   Height 5' 7.2" (1.707 m)    Weight 198 lb 11.2 oz (90.1 kg)    BMI (Calculated) 30.93    Single Leg Stand 8.1 seconds              Nutrition Therapy Plan and Nutrition Goals:  Nutrition Therapy & Goals - 03/09/21 0950       Intervention Plan    Intervention Prescribe, educate and counsel regarding individualized specific dietary modifications aiming towards targeted core components such as weight, hypertension, lipid management, diabetes, heart failure and other comorbidities.    Expected Outcomes Short Term Goal: Understand basic principles of dietary content, such as calories, fat, sodium, cholesterol and nutrients.;Short Term Goal: A plan has been developed with personal nutrition goals set during dietitian appointment.;Long Term Goal: Adherence to prescribed nutrition plan.             Nutrition Assessments:  MEDIFICTS Score Key: ?70 Need to make dietary changes  40-70 Heart Healthy Diet ? 40 Therapeutic Level Cholesterol Diet  Flowsheet Row Cardiac Rehab from 03/09/2021 in [facility] Cardiac and Pulmonary Rehab  Picture Your Plate Total Score on Admission 57      Picture Your Plate Scores: <10 Unhealthy dietary pattern with much room for improvement. 41-50 Dietary pattern unlikely to meet recommendations for good health and room for improvement. 51-60 More healthful dietary pattern, with some room for improvement.  >60 Healthy dietary pattern, although there may be some specific behaviors that could be improved.    Nutrition Goals Re-Evaluation:   Nutrition Goals Discharge (Final Nutrition Goals Re-Evaluation):   Psychosocial: Target Goals: Acknowledge presence or absence of significant depression and/or stress, maximize coping skills, provide positive support system. Participant is able to verbalize types and ability to use techniques and skills needed for reducing stress and depression.   Education: Stress, Anxiety, and Depression - Group verbal and visual presentation to define topics covered.  Reviews how body is impacted by stress, anxiety, and depression.  Also discusses healthy ways to reduce stress and to treat/manage anxiety and depression.  Written material given at graduation.   Education: Sleep  Hygiene -Provides group verbal and written instruction about how sleep can affect your health.  Define sleep hygiene, discuss sleep cycles and impact of sleep habits. Review good sleep hygiene tips.    Initial Review & Psychosocial Screening:  Initial Psych Review & Screening - 03/07/21 0848       Initial Review   Current issues with None Identified      Family Dynamics   Good Support System? Yes   wife   churchfamily     Barriers   Psychosocial barriers to participate in program There are no identifiable barriers or psychosocial needs.      Screening Interventions   Interventions Encouraged to exercise;Provide feedback about the scores to participant;To provide support and resources with identified psychosocial needs    Expected Outcomes Short Term goal: Utilizing psychosocial counselor, staff and physician to assist with identification of specific Stressors or current issues interfering with healing process. Setting desired goal for each stressor or current issue identified.;Long Term Goal: Stressors or current issues are controlled or eliminated.;Short Term goal: Identification and review with participant of any Quality of Life or Depression concerns found by scoring the questionnaire.;Long Term goal: The participant improves  quality of Life and PHQ9 Scores as seen by post scores and/or verbalization of changes             Quality of Life Scores:   Quality of Life - 03/09/21 0934       Quality of Life   Select Quality of Life      Quality of Life Scores   Health/Function Pre 21.33 %    Socioeconomic Pre 27.86 %    Psych/Spiritual Pre 21.64 %    Family Pre 28.8 %    GLOBAL Pre 23.84 %            Scores of 19 and below usually indicate a poorer quality of life in these areas.  A difference of  2-3 points is a clinically meaningful difference.  A difference of 2-3 points in the total score of the Quality of Life Index has been associated with significant improvement in  overall quality of life, self-image, physical symptoms, and general health in studies assessing change in quality of life.  PHQ-9: Recent Review Flowsheet Data     Depression screen [facility] 2/9 03/09/2021 01/23/2021 07/14/2020   Decreased Interest 0 0 0   Down, Depressed, Hopeless 0 0 0   PHQ - 2 Score 0 0 0   Altered sleeping 0 - -   Tired, decreased energy 1 - -   Change in appetite 1 - -   Feeling bad or failure about yourself  0 - -   Trouble concentrating 0 - -   Moving slowly or fidgety/restless 0 - -   Suicidal thoughts 0 - -   PHQ-9 Score 2 - -   Difficult doing work/chores Not difficult at all - -      Interpretation of Total Score  Total Score Depression Severity:  1-4 = Minimal depression, 5-9 = Mild depression, 10-14 = Moderate depression, 15-19 = Moderately severe depression, 20-27 = Severe depression   Psychosocial Evaluation and Intervention:  Psychosocial Evaluation - 03/07/21 0909       Psychosocial Evaluation & Interventions   Interventions Encouraged to exercise with the program and follow exercise prescription    Comments Liliane Channel has no barriers to attending the program. 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 He stated the symptoms occur when he is swinging his arms.  He lives with his wife, his support person. He also has his church family. He is ready to get started to see if he can gain better control of his symptoms.    Expected Outcomes STG: Liliane Channel attends all scheduled sessions, he is able to work on symptom control with the advice of the exercise and nursing staff. LZTG Liliane Channel has acheived better control of his symptoms and continues to maintain control.    Continue Psychosocial Services  Follow up required by staff             Psychosocial Re-Evaluation:   Psychosocial Discharge (Final Psychosocial Re-Evaluation):   Vocational Rehabilitation: Provide vocational rehab  assistance to qualifying candidates.   Vocational Rehab Evaluation & Intervention:  Vocational Rehab - 03/07/21 0857       Initial Vocational Rehab Evaluation & Intervention   Assessment shows need for Vocational Rehabilitation No      Vocational Rehab Re-Evaulation   Comments retired             Education: Education Goals: Education classes will be provided on a variety of topics geared  toward better understanding of heart health and risk factor modification. Participant will state understanding/return demonstration of topics presented as noted by education test scores.  Learning Barriers/Preferences:  Learning Barriers/Preferences - 03/07/21 0856       Learning Barriers/Preferences   Learning Barriers None   wears hearing aids   Learning Preferences None             General Cardiac Education Topics:  AED/CPR: - Group verbal and written instruction with the use of models to demonstrate the basic use of the AED with the basic ABC's of resuscitation.   Anatomy and Cardiac Procedures: - Group verbal and visual presentation and models provide information about basic cardiac anatomy and function. Reviews the testing methods done to diagnose heart disease and the outcomes of the test results. Describes the treatment choices: Medical Management, Angioplasty, or Coronary Bypass Surgery for treating various heart conditions including Myocardial Infarction, Angina, Valve Disease, and Cardiac Arrhythmias.  Written material given at graduation.   Medication Safety: - Group verbal and visual instruction to review commonly prescribed medications for heart and lung disease. Reviews the medication, class of the drug, and side effects. Includes the steps to properly store meds and maintain the prescription regimen.  Written material given at graduation.   Intimacy: - Group verbal instruction through game format to discuss how heart and lung disease can affect sexual intimacy.  Written material given at graduation..   Know Your Numbers and Heart Failure: - Group verbal and visual instruction to discuss disease risk factors for cardiac and pulmonary disease and treatment options.  Reviews associated critical values for Overweight/Obesity, Hypertension, Cholesterol, and Diabetes.  Discusses basics of heart failure: signs/symptoms and treatments.  Introduces Heart Failure Zone chart for action plan for heart failure.  Written material given at graduation.   Infection Prevention: - Provides verbal and written material to individual with discussion of infection control including proper hand washing and proper equipment cleaning during exercise session. Flowsheet Row Cardiac Rehab from 03/29/2021 in [facility] Cardiac and Pulmonary Rehab  Education need identified 03/09/21  Date 03/09/21  Educator [place]  Instruction Review Code 1- Verbalizes Understanding       Falls Prevention: - Provides verbal and written material to individual with discussion of falls prevention and safety. Flowsheet Row Cardiac Rehab from 03/29/2021 in [facility] Cardiac and Pulmonary Rehab  Education need identified 03/09/21  Date 03/09/21  Educator [place]  Instruction Review Code 1- Verbalizes Understanding       Other: -Provides group and verbal instruction on various topics (see comments)   Knowledge Questionnaire Score:  Knowledge Questionnaire Score - 03/09/21 8921       Knowledge Questionnaire Score   Pre Score 23/26: MI, Nutrition, Exercise             Core Components/Risk Factors/Patient Goals at Admission:  Personal Goals and Risk Factors at Admission - 03/09/21 1127       Core Components/Risk Factors/Patient Goals on Admission    Weight Management Yes;Weight Loss    Intervention Weight Management: Develop a combined nutrition and exercise program designed to reach desired caloric intake, while maintaining appropriate intake of nutrient and fiber, sodium and fats, and appropriate  energy expenditure required for the weight goal.;Weight Management: Provide education and appropriate resources to help participant work on and attain dietary goals.;Weight Management/Obesity: Establish reasonable short term and long term weight goals.    Admit Weight 198 lb (89.8 kg)    Goal Weight: Short Term 193 lb (87.5 kg)  Goal Weight: Long Term 180 lb (81.6 kg)    Expected Outcomes Short Term: Continue to assess and modify interventions until short term weight is achieved;Long Term: Adherence to nutrition and physical activity/exercise program aimed toward attainment of established weight goal;Weight Loss: Understanding of general recommendations for a balanced deficit meal plan, which promotes 1-2 lb weight loss per week and includes a negative energy balance of [phone] kcal/d;Understanding recommendations for meals to include 15-35% energy as protein, 25-35% energy from fat, 35-60% energy from carbohydrates, less than 200mg  of dietary cholesterol, 20-35 gm of total fiber daily;Understanding of distribution of calorie intake throughout the day with the consumption of 4-5 meals/snacks    Hypertension Yes    Intervention Provide education on lifestyle modifcations including regular physical activity/exercise, weight management, moderate sodium restriction and increased consumption of fresh fruit, vegetables, and low fat dairy, alcohol moderation, and smoking cessation.;Monitor prescription use compliance.    Expected Outcomes Short Term: Continued assessment and intervention until BP is < 140/97mm HG in hypertensive participants. < 130/69mm HG in hypertensive participants with diabetes, heart failure or chronic kidney disease.;Long Term: Maintenance of blood pressure at goal levels.    Lipids Yes    Intervention Provide education and support for participant on nutrition & aerobic/resistive exercise along with prescribed medications to achieve LDL 70mg , HDL >40mg .    Expected Outcomes Short Term:  Participant states understanding of desired cholesterol values and is compliant with medications prescribed. Participant is following exercise prescription and nutrition guidelines.;Long Term: Cholesterol controlled with medications as prescribed, with individualized exercise RX and with personalized nutrition plan. Value goals: LDL < 70mg , HDL > 40 mg.             Education:Diabetes - Individual verbal and written instruction to review signs/symptoms of diabetes, desired ranges of glucose level fasting, after meals and with exercise. Acknowledge that pre and post exercise glucose checks will be done for 3 sessions at entry of program.   Core Components/Risk Factors/Patient Goals Review:    Core Components/Risk Factors/Patient Goals at Discharge (Final Review):    ITP Comments:  ITP Comments     Row Name 03/07/21 0904 03/09/21 0923 03/10/21 0753 03/20/21 1613 03/29/21 0802   ITP Comments Virtual orientation call completed today. he has an appointment on Date: 03/09/2021  for EP eval and gym Orientation.  Documentation of diagnosis can be found in [facility] Date: 08/25/02022 . Completed 6MWT and gym orientation. Initial ITP created and sent for review to Dr. Emily Filbert, Medical Director. First full day of exercise!  Patient was oriented to gym and equipment including functions, settings, policies, and procedures.  Patient's individual exercise prescription and treatment plan were reviewed.  All starting workloads were established based on the results of the 6 minute walk test done at initial orientation visit.  The plan for exercise progression was also introduced and progression will be customized based on patient's performance and goals. Pt called out today with ED visit and emergent sinus surgery. He was encouraged to get clearance to return to rehab next week. Pt received clearance note to return to rehab. Pt able to follow exercise prescription today without complaint.  Will continue to monitor  for progression.    [place] Name 03/29/21 1403           ITP Comments 30 day review completed. ITP sent to Dr. Emily Filbert, Medical Director of Cardiac Rehab. Continue with ITP unless changes are made by physician.  Comments: 30 day review

## 2021-03-31 ENCOUNTER — Encounter: Payer: Medicare Other | Admitting: *Deleted

## 2021-03-31 ENCOUNTER — Other Ambulatory Visit: Payer: Self-pay

## 2021-03-31 DIAGNOSIS — I2089 Other forms of angina pectoris: Secondary | ICD-10-CM

## 2021-03-31 DIAGNOSIS — I208 Other forms of angina pectoris: Secondary | ICD-10-CM

## 2021-03-31 NOTE — Progress Notes (Signed)
Daily Session Note  Patient Details  Name: Daniel Holden MRN: [id number] Date of Birth: 04/05/1946 Referring Provider:   Flowsheet Row Cardiac Rehab from 03/09/2021 in [facility] Cardiac and Pulmonary Rehab  Referring Provider Fransico Him MD       Encounter Date: 03/31/2021  Check In:  Session Check In - 03/31/21 0748       Check-In   Supervising physician immediately available to respond to emergencies See telemetry face sheet for immediately available ER MD    Location [facility]-Cardiac & Pulmonary Rehab    Staff Present Renita Papa, RN BSN;Joseph [place], RCP,RRT,BSRT;Jessica [place], [place], RCEP, CCRP, CCET    Virtual Visit No    Medication changes reported     No    Fall or balance concerns reported    No    Warm-up and Cool-down Performed on first and last piece of equipment    Resistance Training Performed Yes    VAD Patient? No    PAD/SET Patient? No      Pain Assessment   Currently in Pain? No/denies                Social History   Tobacco Use  Smoking Status Never  Smokeless Tobacco Never    Goals Met:  Independence with exercise equipment Exercise tolerated well No report of concerns or symptoms today Strength training completed today  Goals Unmet:  Not Applicable  Comments: Pt able to follow exercise prescription today without complaint.  Will continue to monitor for progression.    Dr. Emily Filbert is Medical Director for [place].  Dr. Ottie Glazier is Medical Director for [facility] Pulmonary Rehabilitation.

## 2021-04-03 ENCOUNTER — Other Ambulatory Visit: Payer: Self-pay

## 2021-04-03 DIAGNOSIS — I2089 Other forms of angina pectoris: Secondary | ICD-10-CM

## 2021-04-03 DIAGNOSIS — I208 Other forms of angina pectoris: Secondary | ICD-10-CM

## 2021-04-03 NOTE — Progress Notes (Signed)
Daily Session Note  Patient Details  Name: Daniel Holden MRN: [id number] Date of Birth: [birth year] Referring Provider:   Flowsheet Row Cardiac Rehab from 03/09/2021 in [facility] Cardiac and Pulmonary Rehab  Referring Provider Fransico Him MD       Encounter Date: 04/03/2021  Check In:  Session Check In - 04/03/21 0807       Check-In   Supervising physician immediately available to respond to emergencies See telemetry face sheet for immediately available ER MD    Location [facility]-Cardiac & Pulmonary Rehab    Staff Present Earlean Shawl, BS, ACSM CEP, Exercise Physiologist;Joseph Tessie Fass, RCP,RRT,BSRT;Kristen [place], RN,BC,MSN    Virtual Visit No    Medication changes reported     No    Fall or balance concerns reported    No    Warm-up and Cool-down Performed on first and last piece of equipment    Resistance Training Performed Yes    VAD Patient? No    PAD/SET Patient? No      Pain Assessment   Currently in Pain? No/denies                Social History   Tobacco Use  Smoking Status Never  Smokeless Tobacco Never    Goals Met:  Independence with exercise equipment Exercise tolerated well No report of concerns or symptoms today Strength training completed today  Goals Unmet:  Not Applicable  Comments: Pt able to follow exercise prescription today without complaint.  Will continue to monitor for progression.    Dr. Emily Filbert is Medical Director for [place].  Dr. Ottie Glazier is Medical Director for [facility] Pulmonary Rehabilitation.

## 2021-04-05 ENCOUNTER — Other Ambulatory Visit: Payer: Self-pay

## 2021-04-05 DIAGNOSIS — I208 Other forms of angina pectoris: Secondary | ICD-10-CM

## 2021-04-05 DIAGNOSIS — I2089 Other forms of angina pectoris: Secondary | ICD-10-CM

## 2021-04-05 NOTE — Progress Notes (Signed)
Daily Session Note  Patient Details  Name: ROSWELL NDIAYE MRN: [id number] Date of Birth: [birth year] Referring Provider:   Flowsheet Row Cardiac Rehab from 03/09/2021 in [facility] Cardiac and Pulmonary Rehab  Referring Provider Fransico Him MD       Encounter Date: 04/05/2021  Check In:  Session Check In - 04/05/21 0804       Check-In   Supervising physician immediately available to respond to emergencies See telemetry face sheet for immediately available ER MD    Location [facility]-Cardiac & Pulmonary Rehab    Staff Present Birdie Sons, MPA, Elveria Rising, BA, ACSM CEP, Exercise Physiologist;Joseph Tessie Fass, [place]    Virtual Visit No    Medication changes reported     No    Fall or balance concerns reported    No    Warm-up and Cool-down Performed on first and last piece of equipment    Resistance Training Performed Yes    VAD Patient? No    PAD/SET Patient? No      Pain Assessment   Currently in Pain? No/denies                Social History   Tobacco Use  Smoking Status Never  Smokeless Tobacco Never    Goals Met:  Independence with exercise equipment Exercise tolerated well No report of concerns or symptoms today Strength training completed today  Goals Unmet:  Not Applicable  Comments: Pt able to follow exercise prescription today without complaint.  Will continue to monitor for progression.    Dr. Emily Filbert is Medical Director for [place].  Dr. Ottie Glazier is Medical Director for [facility] Pulmonary Rehabilitation.

## 2021-04-06 DIAGNOSIS — I208 Other forms of angina pectoris: Secondary | ICD-10-CM

## 2021-04-06 NOTE — Progress Notes (Signed)
Daily Session Note  Patient Details  Name: Daniel Holden MRN: [id number] Date of Birth: Nov 21, 1945 Referring Provider:   Flowsheet Row Cardiac Rehab from 03/09/2021 in [facility] Cardiac and Pulmonary Rehab  Referring Provider Fransico Him MD       Encounter Date: 04/06/2021  Check In:  Session Check In - 04/06/21 0802       Check-In   Supervising physician immediately available to respond to emergencies See telemetry face sheet for immediately available ER MD    Location [facility]-Cardiac & Pulmonary Rehab    Staff Present Birdie Sons, MPA, RN;Amanda Oletta Darter, BA, ACSM CEP, Exercise Physiologist;Melissa Caiola, RDN, LDN    Virtual Visit No    Medication changes reported     No    Fall or balance concerns reported    No    Warm-up and Cool-down Performed on first and last piece of equipment    Resistance Training Performed Yes    VAD Patient? No    PAD/SET Patient? No      Pain Assessment   Currently in Pain? No/denies                Social History   Tobacco Use  Smoking Status Never  Smokeless Tobacco Never    Goals Met:  Independence with exercise equipment Exercise tolerated well No report of concerns or symptoms today Strength training completed today  Goals Unmet:  Not Applicable  Comments: Pt able to follow exercise prescription today without complaint.  Will continue to monitor for progression.    Dr. Emily Filbert is Medical Director for [place].  Dr. Ottie Glazier is Medical Director for [facility] Pulmonary Rehabilitation.

## 2021-04-06 NOTE — Progress Notes (Signed)
Daily Session Note  Patient Details  Name: CHUKWUKA FESTA MRN: [id number] Date of Birth: 1945-08-31 Referring Provider:   Flowsheet Row Cardiac Rehab from 03/09/2021 in [facility] Cardiac and Pulmonary Rehab  Referring Provider Fransico Him MD       Encounter Date: 04/06/2021  Check In:  Session Check In - 04/06/21 0802       Check-In   Supervising physician immediately available to respond to emergencies See telemetry face sheet for immediately available ER MD    Location [facility]-Cardiac & Pulmonary Rehab    Staff Present Birdie Sons, MPA, RN;Annasofia Pohl Oletta Darter, BA, ACSM CEP, Exercise Physiologist;Melissa Caiola, RDN, LDN    Virtual Visit No    Medication changes reported     No    Fall or balance concerns reported    No    Warm-up and Cool-down Performed on first and last piece of equipment    Resistance Training Performed Yes    VAD Patient? No    PAD/SET Patient? No      Pain Assessment   Currently in Pain? No/denies                Social History   Tobacco Use  Smoking Status Never  Smokeless Tobacco Never    Goals Met:  Independence with exercise equipment Exercise tolerated well Personal goals reviewed Strength training completed today  Goals Unmet:  Not Applicable  Comments: Pt able to follow exercise prescription today without complaint.  Will continue to monitor for progression. Reviewed home exercise with pt today.  Pt plans to walk and bike at home  for exercise.  Reviewed THR, pulse, RPE, sign and symptoms, pulse oximetery and when to call 911 or MD.  Also discussed weather considerations and indoor options.  Pt voiced understanding.    Dr. Emily Filbert is Medical Director for [place].  Dr. Ottie Glazier is Medical Director for [facility] Pulmonary Rehabilitation.

## 2021-04-07 DIAGNOSIS — Z01818 Encounter for other preprocedural examination: Secondary | ICD-10-CM | POA: Diagnosis not present

## 2021-04-07 DIAGNOSIS — H2512 Age-related nuclear cataract, left eye: Secondary | ICD-10-CM | POA: Diagnosis not present

## 2021-04-07 DIAGNOSIS — H2511 Age-related nuclear cataract, right eye: Secondary | ICD-10-CM | POA: Diagnosis not present

## 2021-04-10 ENCOUNTER — Encounter: Payer: Medicare Other | Admitting: *Deleted

## 2021-04-10 ENCOUNTER — Other Ambulatory Visit: Payer: Self-pay

## 2021-04-10 DIAGNOSIS — J329 Chronic sinusitis, unspecified: Secondary | ICD-10-CM | POA: Diagnosis not present

## 2021-04-10 DIAGNOSIS — I208 Other forms of angina pectoris: Secondary | ICD-10-CM

## 2021-04-10 DIAGNOSIS — J301 Allergic rhinitis due to pollen: Secondary | ICD-10-CM | POA: Diagnosis not present

## 2021-04-10 NOTE — Progress Notes (Signed)
Daily Session Note  Patient Details  Name: Daniel Holden MRN: [id number] Date of Birth: 1946/04/28 Referring Provider:   Flowsheet Row Cardiac Rehab from 03/09/2021 in [facility] Cardiac and Pulmonary Rehab  Referring Provider Fransico Him MD       Encounter Date: 04/10/2021  Check In:  Session Check In - 04/10/21 0833       Check-In   Supervising physician immediately available to respond to emergencies See telemetry face sheet for immediately available ER MD    Location [facility]-Cardiac & Pulmonary Rehab    Staff Present Heath Lark, RN, BSN, CCRP;Joseph [place], RCP,RRT,BSRT;Kelly [place], [place], ACSM CEP, Exercise Physiologist    Virtual Visit No    Medication changes reported     No    Fall or balance concerns reported    No    Warm-up and Cool-down Performed on first and last piece of equipment    Resistance Training Performed Yes    VAD Patient? No    PAD/SET Patient? No      Pain Assessment   Currently in Pain? No/denies                Social History   Tobacco Use  Smoking Status Never  Smokeless Tobacco Never    Goals Met:  Independence with exercise equipment Exercise tolerated well No report of concerns or symptoms today  Goals Unmet:  Not Applicable  Comments: Pt able to follow exercise prescription today without complaint.  Will continue to monitor for progression.    Dr. Emily Filbert is Medical Director for [place].  Dr. Ottie Glazier is Medical Director for [facility] Pulmonary Rehabilitation.

## 2021-04-10 NOTE — Progress Notes (Signed)
Completed initial RD consultation ?

## 2021-04-12 ENCOUNTER — Other Ambulatory Visit: Payer: Self-pay

## 2021-04-12 DIAGNOSIS — I208 Other forms of angina pectoris: Secondary | ICD-10-CM | POA: Diagnosis not present

## 2021-04-12 NOTE — Progress Notes (Signed)
Daily Session Note  Patient Details  Name: Daniel Holden MRN: [id number] Date of Birth: 15-May-1946 Referring Provider:   Flowsheet Row Cardiac Rehab from 03/09/2021 in [facility] Cardiac and Pulmonary Rehab  Referring Provider Fransico Him MD       Encounter Date: 04/12/2021  Check In:  Session Check In - 04/12/21 0743       Check-In   Supervising physician immediately available to respond to emergencies See telemetry face sheet for immediately available ER MD    Location [facility]-Cardiac & Pulmonary Rehab    Staff Present Birdie Sons, MPA, Elveria Rising, BA, ACSM CEP, Exercise Physiologist;Joseph Tessie Fass, [place]    Virtual Visit No    Medication changes reported     No    Fall or balance concerns reported    No    Warm-up and Cool-down Performed on first and last piece of equipment    Resistance Training Performed Yes    VAD Patient? No    PAD/SET Patient? No      Pain Assessment   Currently in Pain? No/denies                Social History   Tobacco Use  Smoking Status Never  Smokeless Tobacco Never    Goals Met:  Independence with exercise equipment Exercise tolerated well No report of concerns or symptoms today Strength training completed today  Goals Unmet:  Not Applicable  Comments: Pt able to follow exercise prescription today without complaint.  Will continue to monitor for progression.    Dr. Emily Filbert is Medical Director for [place].  Dr. Ottie Glazier is Medical Director for [facility] Pulmonary Rehabilitation.

## 2021-04-14 ENCOUNTER — Encounter: Payer: Medicare Other | Admitting: *Deleted

## 2021-04-14 ENCOUNTER — Other Ambulatory Visit: Payer: Self-pay

## 2021-04-14 DIAGNOSIS — I208 Other forms of angina pectoris: Secondary | ICD-10-CM

## 2021-04-14 NOTE — Progress Notes (Signed)
Daily Session Note  Patient Details  Name: Daniel Holden MRN: [id number] Date of Birth: September 27, 1945 Referring Provider:   Flowsheet Row Cardiac Rehab from 03/09/2021 in [facility] Cardiac and Pulmonary Rehab  Referring Provider Fransico Him MD       Encounter Date: 04/14/2021  Check In:  Session Check In - 04/14/21 0804       Check-In   Supervising physician immediately available to respond to emergencies See telemetry face sheet for immediately available ER MD    Location [facility]-Cardiac & Pulmonary Rehab    Staff Present Nyoka Cowden, RN, BSN, Ardeth Sportsman, RDN, LDN;Susanne Bice, RN, BSN, CCRP    Virtual Visit No    Fall or balance concerns reported    No    Tobacco Cessation No Change    Warm-up and Cool-down Performed on first and last piece of equipment    Resistance Training Performed Yes    VAD Patient? No    PAD/SET Patient? No      Pain Assessment   Currently in Pain? No/denies                Social History   Tobacco Use  Smoking Status Never  Smokeless Tobacco Never    Goals Met:  Independence with exercise equipment Exercise tolerated well No report of concerns or symptoms today  Goals Unmet:  Not Applicable  Comments: Pt able to follow exercise prescription today without complaint.  Will continue to monitor for progression.    Dr. Emily Filbert is Medical Director for [place].  Dr. Ottie Glazier is Medical Director for [facility] Pulmonary Rehabilitation.

## 2021-04-17 DIAGNOSIS — H2512 Age-related nuclear cataract, left eye: Secondary | ICD-10-CM | POA: Diagnosis not present

## 2021-04-19 ENCOUNTER — Telehealth: Payer: Self-pay

## 2021-04-19 ENCOUNTER — Other Ambulatory Visit: Payer: Self-pay

## 2021-04-19 DIAGNOSIS — K219 Gastro-esophageal reflux disease without esophagitis: Secondary | ICD-10-CM | POA: Diagnosis not present

## 2021-04-19 DIAGNOSIS — I208 Other forms of angina pectoris: Secondary | ICD-10-CM | POA: Diagnosis not present

## 2021-04-19 DIAGNOSIS — Z86010 Personal history of colonic polyps: Secondary | ICD-10-CM | POA: Diagnosis not present

## 2021-04-19 NOTE — Progress Notes (Signed)
Daily Session Note  Patient Details  Name: Daniel Holden MRN: [id number] Date of Birth: [birth year] Referring Provider:   Flowsheet Row Cardiac Rehab from 03/09/2021 in [facility] Cardiac and Pulmonary Rehab  Referring Provider Fransico Him MD       Encounter Date: 04/19/2021  Check In:  Session Check In - 04/19/21 0754       Check-In   Supervising physician immediately available to respond to emergencies See telemetry face sheet for immediately available ER MD    Location [facility]-Cardiac & Pulmonary Rehab    Staff Present Birdie Sons, MPA, Elveria Rising, BA, ACSM CEP, Exercise Physiologist;Joseph Tessie Fass, [place]    Virtual Visit No    Medication changes reported     No    Fall or balance concerns reported    No    Tobacco Cessation No Change    Warm-up and Cool-down Performed on first and last piece of equipment    Resistance Training Performed Yes    VAD Patient? No    PAD/SET Patient? No      Pain Assessment   Currently in Pain? No/denies                Social History   Tobacco Use  Smoking Status Never  Smokeless Tobacco Never    Goals Met:  Independence with exercise equipment Exercise tolerated well No report of concerns or symptoms today Strength training completed today  Goals Unmet:  Not Applicable  Comments: Pt able to follow exercise prescription today without complaint.  Will continue to monitor for progression.    Dr. Emily Filbert is Medical Director for [place].  Dr. Ottie Glazier is Medical Director for [facility] Pulmonary Rehabilitation.

## 2021-04-19 NOTE — Telephone Encounter (Signed)
   [facility] Pre-operative Risk Assessment    Patient Name: Daniel Holden  DOB: 09-16-1945 MRN: [id number]  Request for surgical clearance:  What type of surgery is being performed? Colonoscopy  When is this surgery scheduled? 06/27/2021  What type of clearance is required (medical clearance vs. Pharmacy clearance to hold med vs. Both)? Medical  Are there any medications that need to be held prior to surgery and how long? None listed but patient takes Aspirin 40m daily  Practice name and name of physician performing [facility]? [facility] - C. TRaylene Miyamoto MD  What is the office phone number? [phone]  7.   What is the office fax number? [phone] 8.   Anesthesia type (None, local, MAC, general) ? General   Daniel Holden 04/19/2021, 4:25 PM  _________________________________________________________________   (provider comments below)

## 2021-04-19 NOTE — Telephone Encounter (Signed)
   Patient Name: Daniel Holden  DOB: 09/04/1945 MRN: [id number]  Primary Cardiologist: Fransico Him, MD  Chart reviewed as part of pre-operative protocol coverage. The patient has an upcoming appointment scheduled 05/02/21 with Laurann Montana at which time this clearance can be addressed in case there are any issues that would impact surgical clearance. (This is well in advance of 06/2021 procedure date.) I added preop FYI to appointment notes so that provider is aware to address at time of OV.   Per office protocol, the cardiology provider should forward their finalized clearance decision to requesting party below. I will route this message as FYI to requesting party and remove this message from the pre-op box as separate preop APP input not needed at this time. Do not typically need to hold ASA for colonoscopy, not requested on form.  Please call with questions.  Charlie Pitter, PA-C 04/19/2021, 4:33 PM

## 2021-04-21 ENCOUNTER — Other Ambulatory Visit: Payer: Self-pay

## 2021-04-21 ENCOUNTER — Other Ambulatory Visit: Payer: Self-pay | Admitting: *Deleted

## 2021-04-21 ENCOUNTER — Encounter: Payer: Medicare Other | Admitting: *Deleted

## 2021-04-21 DIAGNOSIS — I208 Other forms of angina pectoris: Secondary | ICD-10-CM

## 2021-04-21 NOTE — Progress Notes (Signed)
Daily Session Note  Patient Details  Name: Daniel Holden MRN: [id number] Date of Birth: 1945-07-11 Referring Provider:   Flowsheet Row Cardiac Rehab from 03/09/2021 in [facility] Cardiac and Pulmonary Rehab  Referring Provider Fransico Him MD       Encounter Date: 04/21/2021  Check In:  Session Check In - 04/21/21 0957       Check-In   Supervising physician immediately available to respond to emergencies See telemetry face sheet for immediately available ER MD    Location [facility]-Cardiac & Pulmonary Rehab    Staff Present Heath Lark, RN, BSN, CCRP;Joseph [place], RCP,RRT,BSRT;Jessica [place], [place], [place], CCRP, CCET    Virtual Visit No    Medication changes reported     No    Fall or balance concerns reported    No    Warm-up and Cool-down Performed on first and last piece of equipment    Resistance Training Performed Yes    VAD Patient? No    PAD/SET Patient? No                Social History   Tobacco Use  Smoking Status Never  Smokeless Tobacco Never    Goals Met:  Independence with exercise equipment Exercise tolerated well No report of concerns or symptoms today  Goals Unmet:  Not Applicable  Comments: Pt able to follow exercise prescription today without complaint.  Will continue to monitor for progression.    Dr. Emily Filbert is Medical Director for [place].  Dr. Ottie Glazier is Medical Director for [facility] Pulmonary Rehabilitation.

## 2021-04-24 ENCOUNTER — Other Ambulatory Visit: Payer: Self-pay

## 2021-04-24 ENCOUNTER — Encounter: Payer: Self-pay | Admitting: Internal Medicine

## 2021-04-24 ENCOUNTER — Ambulatory Visit (INDEPENDENT_AMBULATORY_CARE_PROVIDER_SITE_OTHER): Payer: Medicare Other | Admitting: Internal Medicine

## 2021-04-24 ENCOUNTER — Encounter: Payer: Medicare Other | Admitting: *Deleted

## 2021-04-24 VITALS — BP 142/78 | HR 54 | Ht 66.0 in | Wt 198.5 lb

## 2021-04-24 DIAGNOSIS — Z6832 Body mass index (BMI) 32.0-32.9, adult: Secondary | ICD-10-CM

## 2021-04-24 DIAGNOSIS — E6609 Other obesity due to excess calories: Secondary | ICD-10-CM | POA: Diagnosis not present

## 2021-04-24 DIAGNOSIS — R931 Abnormal findings on diagnostic imaging of heart and coronary circulation: Secondary | ICD-10-CM | POA: Diagnosis not present

## 2021-04-24 DIAGNOSIS — Z9989 Dependence on other enabling machines and devices: Secondary | ICD-10-CM | POA: Diagnosis not present

## 2021-04-24 DIAGNOSIS — R935 Abnormal findings on diagnostic imaging of other abdominal regions, including retroperitoneum: Secondary | ICD-10-CM

## 2021-04-24 DIAGNOSIS — R918 Other nonspecific abnormal finding of lung field: Secondary | ICD-10-CM | POA: Diagnosis not present

## 2021-04-24 DIAGNOSIS — I208 Other forms of angina pectoris: Secondary | ICD-10-CM

## 2021-04-24 DIAGNOSIS — I209 Angina pectoris, unspecified: Secondary | ICD-10-CM | POA: Diagnosis not present

## 2021-04-24 DIAGNOSIS — G4733 Obstructive sleep apnea (adult) (pediatric): Secondary | ICD-10-CM

## 2021-04-24 DIAGNOSIS — G629 Polyneuropathy, unspecified: Secondary | ICD-10-CM

## 2021-04-24 DIAGNOSIS — Z23 Encounter for immunization: Secondary | ICD-10-CM | POA: Diagnosis not present

## 2021-04-24 DIAGNOSIS — I10 Essential (primary) hypertension: Secondary | ICD-10-CM | POA: Diagnosis not present

## 2021-04-24 DIAGNOSIS — K760 Fatty (change of) liver, not elsewhere classified: Secondary | ICD-10-CM | POA: Diagnosis not present

## 2021-04-24 MED ORDER — ALPRAZOLAM 0.25 MG PO TABS: 0.2500 mg | ORAL_TABLET | Freq: Every evening | ORAL | 0 refills | Status: DC | PRN

## 2021-04-24 NOTE — Progress Notes (Signed)
Daily Session Note  Patient Details  Name: Daniel Holden MRN: [id number] Date of Birth: 1945-12-09 Referring Provider:   Flowsheet Row Cardiac Rehab from 03/09/2021 in [facility] Card[facility]  Referring Provider Fransico Him MD       Encounter Date: 04/24/2021  Check In:  Session Check In - 04/24/21 0841       Check-In   Supervising physician immediately available to respond to emergencies See telemetry face sheet for immediately available ER MD    Location [facility]-Cardiac & Pulmonary Rehab    Staff Present Heath Lark, RN, BSN, Laveda Norman, BS, ACSM CEP, Exercise Physiologist;Laureen Owens Shark, BS, RRT, CPFT;Joseph [place], [place]    Virtual Visit No    Medication changes reported     No    Fall or balance concerns reported    No    Warm-up and Cool-down Performed on first and last piece of equipment    Resistance Training Performed Yes    VAD Patient? No    PAD/SET Patient? No      Pain Assessment   Currently in Pain? No/denies                Social History   Tobacco Use  Smoking Status Never  Smokeless Tobacco Never    Goals Met:  Independence with exercise equipment Exercise tolerated well No report of concerns or symptoms today  Goals Unmet:  Not Applicable  Comments: Pt able to follow exercise prescription today without complaint.  Will continue to monitor for progression.    Dr. Emily Filbert is Medical Director for [facility].  Dr. Ottie Glazier is Medical Director for [facility] Pulmonary Rehabilitation.

## 2021-04-25 LAB — FUNGAL ORGANISM REFLEX

## 2021-04-25 LAB — FUNGUS CULTURE RESULT

## 2021-04-26 ENCOUNTER — Encounter: Payer: Self-pay | Admitting: *Deleted

## 2021-04-26 ENCOUNTER — Encounter: Payer: Medicare Other | Attending: Cardiology

## 2021-04-26 ENCOUNTER — Other Ambulatory Visit: Payer: Self-pay

## 2021-04-26 DIAGNOSIS — I208 Other forms of angina pectoris: Secondary | ICD-10-CM | POA: Diagnosis not present

## 2021-04-26 NOTE — Progress Notes (Signed)
Daily Session Note  Patient Details  Name: Daniel Holden MRN: [id number] Date of Birth: [birth year] Referring Provider:   Flowsheet Row Cardiac Rehab from 03/09/2021 in [facility] Cardiac and Pulmonary Rehab  Referring Provider Fransico Him MD       Encounter Date: 04/26/2021  Check In:  Session Check In - 04/26/21 0748       Check-In   Supervising physician immediately available to respond to emergencies See telemetry face sheet for immediately available ER MD    Location [facility]-Cardiac & Pulmonary Rehab    Staff Present Birdie Sons, MPA, Elveria Rising, BA, ACSM CEP, Exercise Physiologist;Joseph Tessie Fass, [place]    Virtual Visit No    Medication changes reported     No    Fall or balance concerns reported    No    Tobacco Cessation No Change    Warm-up and Cool-down Performed on first and last piece of equipment    Resistance Training Performed Yes    VAD Patient? No    PAD/SET Patient? No      Pain Assessment   Currently in Pain? No/denies                Social History   Tobacco Use  Smoking Status Never  Smokeless Tobacco Never    Goals Met:  Proper associated with RPD/PD & O2 Sat Exercise tolerated well No report of concerns or symptoms today Strength training completed today  Goals Unmet:  Not Applicable  Comments: Pt able to follow exercise prescription today without complaint.  Will continue to monitor for progression.    Dr. Emily Filbert is Medical Director for [facility].  Dr. Ottie Glazier is Medical Director for [facility] Pulmonary Rehabilitation.

## 2021-04-26 NOTE — Progress Notes (Signed)
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[place])  Patient's Home Medications on Admission:  Current Outpatient Medications:    ALPRAZolam (XANAX) 0.25 MG tablet, Take 1 tablet (0.25 mg total) by mouth at bedtime as needed for sleep., Disp: 30 tablet, Rfl: 0   amLODipine (NORVASC) 5 MG tablet, Take 1 tablet (5 mg total) by mouth daily., Disp: 30 tablet, Rfl: 11   amoxicillin-clavulanate (AUGMENTIN) 875-125 MG tablet, Take 1 tablet by mouth every 12 (twelve) hours., Disp: 28 tablet, Rfl: 0   aspirin 81 MG EC tablet, Take 81 mg by mouth daily., Disp: , Rfl:    atorvastatin (LIPITOR) 80 MG tablet, Take 1 tablet (80 mg total) by mouth daily., Disp: 90 tablet, Rfl: 3   benazepril (LOTENSIN) 20 MG tablet, Take 1 tablet (20 mg total) by mouth daily., Disp: 90 tablet, Rfl: 3   Cholecalciferol 250 MCG (10000 UT) TABS, Take 1,000 Units by mouth daily., Disp: , Rfl:    cyanocobalamin 1000 MCG tablet, Take 1,000 mcg by mouth daily., Disp: , Rfl:    fluticasone (FLONASE) 50 MCG/ACT nasal spray, Place 2 sprays into both nostrils daily. , Disp: , Rfl:    gabapentin (NEURONTIN) 300 MG capsule, Take 300 mg by mouth 2 (two) times daily., Disp: , Rfl:    melatonin 3 MG TABS tablet, Take 10 mg by mouth at bedtime., Disp: , Rfl:    metoprolol tartrate (LOPRESSOR) 50 MG tablet, Take 1 tablet (50 mg total) by mouth 2 (two) times daily., Disp: 180 tablet, Rfl: 3   naproxen sodium (ALEVE) 220 MG tablet, Take 1 tablet (220 mg total) by mouth 2 (two) times daily as needed., Disp: , Rfl:    omeprazole (PRILOSEC OTC) 20 MG tablet, Take  10 mg by mouth daily., Disp: , Rfl:    ranolazine (RANEXA) 1000 MG SR tablet, Take 1 tablet (1,000 mg total) by mouth 2 (two) times daily., Disp: 180 tablet, Rfl: 3  Past Medical History: Past Medical History:  Diagnosis Date   Allergy    Angina of effort ([place])    Anxiety    CAD (coronary artery disease), native coronary artery    coronary CTA showing aortic atherosclerosis with very high coronary Ca score at 4630 with severely calcified RCA and LAD > 70% and 50-69% prox to mid LCx.     Carpal tunnel syndrome    HLD (hyperlipidemia)    Hypertension    Lung nodule    Obesity    Pancreatitis 2015   Sleep apnea     Tobacco Use: Social History   Tobacco Use  Smoking Status Never  Smokeless Tobacco Never    Labs: Recent Review Flowsheet Data     Labs for ITP Cardiac and Pulmonary Rehab Latest Ref Rng & Units 09/27/2013 11/07/2020 03/19/2021   Cholestrol 100 - 199 mg/dL 83 104 -   LDLCALC 0 - 99 mg/dL 17 26 -   HDL >39 mg/dL 28(L) 58 -   Trlycerides 0 - 149 mg/dL 188 111 -   Hemoglobin A1c 4.8 - 5.6 % - - 6.6(H)  Exercise Target Goals: Exercise Program Goal: Individual exercise prescription set using results from initial 6 min walk test and THRR while considering  patient's activity barriers and safety.   Exercise Prescription Goal: Initial exercise prescription builds to 30-45 minutes a day of aerobic activity, 2-3 days per week.  Home exercise guidelines will be given to patient during program as part of exercise prescription that the participant will acknowledge.   Education: Aerobic Exercise: - Group verbal and visual presentation on the components of exercise prescription. Introduces F.I.T.T principle from ACSM for exercise prescriptions.  Reviews F.I.T.T. principles of aerobic exercise including progression. Written material given at graduation. Flowsheet Row Cardiac Rehab from 04/26/2021 in [facility] Cardiac and Pulmonary Rehab  Education need identified 03/09/21        Education: Resistance Exercise: - Group verbal and visual presentation on the components of exercise prescription. Introduces F.I.T.T principle from ACSM for exercise prescriptions  Reviews F.I.T.T. principles of resistance exercise including progression. Written material given at graduation. Flowsheet Row Cardiac Rehab from 04/26/2021 in [facility] Cardiac and Pulmonary Rehab  Date 04/26/21  Educator [facility]  Instruction Review Code 1- Verbalizes Understanding        Education: Exercise & Equipment Safety: - Individual verbal instruction and demonstration of equipment use and safety with use of the equipment. Flowsheet Row Cardiac Rehab from 04/26/2021 in [facility] Cardiac and Pulmonary Rehab  Education need identified 03/09/21  Date 03/09/21  Educator [place]  Instruction Review Code 1- Verbalizes Understanding       Education: Exercise Physiology & General Exercise Guidelines: - Group verbal and written instruction with models to review the exercise physiology of the cardiovascular system and associated critical values. Provides general exercise guidelines with specific guidelines to those with heart or lung disease.  Flowsheet Row Cardiac Rehab from 04/26/2021 in [facility] Cardiac and Pulmonary Rehab  Date 04/12/21  Educator [facility]  Instruction Review Code 1- Verbalizes Understanding       Education: Flexibility, Balance, Mind/Body Relaxation: - Group verbal and visual presentation with interactive activity on the components of exercise prescription. Introduces F.I.T.T principle from ACSM for exercise prescriptions. Reviews F.I.T.T. principles of flexibility and balance exercise training including progression. Also discusses the mind body connection.  Reviews various relaxation techniques to help reduce and manage stress (i.e. Deep breathing, progressive muscle relaxation, and visualization). Balance handout provided to take home. Written material given at graduation.   Activity Barriers & Risk  Stratification:  Activity Barriers & Cardiac Risk Stratification - 03/09/21 1353       Activity Barriers & Cardiac Risk Stratification   Activity Barriers Left Knee Replacement;Chest Pain/Angina;Deconditioning    Cardiac Risk Stratification Moderate             6 Minute Walk:  6 Minute Walk     Row Name 03/09/21 0934         6 Minute Walk   Phase Initial     Distance 1200 feet     Walk Time 6 minutes     # of Rest Breaks 0     MPH 2.27     METS 2.08     RPE 8     Perceived Dyspnea  0     VO2 Peak 7.29     Symptoms No     Resting HR 54 bpm     Resting BP 98/62     Resting Oxygen Saturation  98 %     Exercise Oxygen Saturation  during 6 min walk 97 %  Max Ex. HR 73 bpm     Max Ex. BP 118/60     2 Minute Post BP 106/60              Oxygen Initial Assessment:   Oxygen Re-Evaluation:   Oxygen Discharge (Final Oxygen Re-Evaluation):   Initial Exercise Prescription:  Initial Exercise Prescription - 03/09/21 0900       Date of Initial Exercise RX and Referring Provider   Date 03/09/21    Referring Provider Fransico Him MD      NuStep   Level 2    SPM 80    Minutes 15    METs 2.08      REL-XR   Level 1    Speed 50    Minutes 15    METs 2      Track   Laps 25   as tolerated   Minutes 15    METs 2.36      Prescription Details   Frequency (times per week) 3    Duration Progress to 30 minutes of continuous aerobic without signs/symptoms of physical distress      Intensity   THRR 40-80% of Max Heartrate 91-129    Ratings of Perceived Exertion 11-13    Perceived Dyspnea 0-4      Progression   Progression Continue progressive overload as per policy without signs/symptoms or physical distress.      Resistance Training   Training Prescription Yes    Weight 4 lb    Reps 10-15             Perform Capillary Blood Glucose checks as needed.  Exercise Prescription Changes:   Exercise Prescription Changes     Row Name 03/09/21  0900 03/20/21 1600 04/03/21 0800 04/17/21 1000       Response to Exercise   Blood Pressure (Admit) 98/62 124/72 124/70 106/56    Blood Pressure (Exercise) 118/60 122/64 152/70 --    Blood Pressure (Exit) 106/60 96/56 122/60 110/60    Heart Rate (Admit) 54 bpm 57 bpm 53 bpm 72 bpm    Heart Rate (Exercise) 73 bpm 80 bpm 92 bpm 82 bpm    Heart Rate (Exit) 55 bpm 64 bpm 68 bpm 64 bpm    Oxygen Saturation (Admit) 98 % -- -- --    Oxygen Saturation (Exercise) 97 % -- -- --    Oxygen Saturation (Exit) 97 % -- -- --    Rating of Perceived Exertion (Exercise) 8 13 15 13     Perceived Dyspnea (Exercise) 0 -- -- --    Symptoms none none none none    Comments walk test results -- -- --    Duration -- Continue with 30 min of aerobic exercise without signs/symptoms of physical distress. Continue with 30 min of aerobic exercise without signs/symptoms of physical distress. Continue with 30 min of aerobic exercise without signs/symptoms of physical distress.    Intensity -- THRR unchanged THRR unchanged THRR unchanged      Progression   Progression -- Continue to progress workloads to maintain intensity without signs/symptoms of physical distress. Continue to progress workloads to maintain intensity without signs/symptoms of physical distress. Continue to progress workloads to maintain intensity without signs/symptoms of physical distress.    Average METs -- 3.34 2.37 2.96      Resistance Training   Training Prescription -- Yes Yes Yes    Weight -- 4 lb 6 lb 6 lb    Reps -- 10-15 10-15 10-15  Interval Training   Interval Training -- No No No      Recumbant Bike   Level -- -- -- 6    Minutes -- -- -- 15    METs -- -- -- 2.89      NuStep   Level -- 4 -- 5    Minutes -- 15 -- 15    METs -- 3.5 -- 3.7      REL-XR   Level -- -- 3 1.5    Speed -- -- 50 --    Minutes -- -- 15 15    METs -- -- 2 2      Track   Laps -- 40 32 37    Minutes -- 15 15 15     METs -- 3.18 2.74 3.01       Oxygen   Maintain Oxygen Saturation -- 88% or higher 88% or higher 88% or higher             Exercise Comments:   Exercise Comments     Row Name 03/29/21 0802           Exercise Comments Pt received clearance note to return to rehab. Pt able to follow exercise prescription today without complaint.  Will continue to monitor for progression.                Exercise Goals and Review:   Exercise Goals     Row Name 03/09/21 0933             Exercise Goals   Increase Physical Activity Yes       Intervention Provide advice, education, support and counseling about physical activity/exercise needs.;Develop an individualized exercise prescription for aerobic and resistive training based on initial evaluation findings, risk stratification, comorbidities and participant's personal goals.       Expected Outcomes Short Term: Attend rehab on a regular basis to increase amount of physical activity.;Long Term: Add in home exercise to make exercise part of routine and to increase amount of physical activity.;Long Term: Exercising regularly at least 3-5 days a week.       Increase Strength and Stamina Yes       Intervention Provide advice, education, support and counseling about physical activity/exercise needs.;Develop an individualized exercise prescription for aerobic and resistive training based on initial evaluation findings, risk stratification, comorbidities and participant's personal goals.       Expected Outcomes Short Term: Increase workloads from initial exercise prescription for resistance, speed, and METs.;Short Term: Perform resistance training exercises routinely during rehab and add in resistance training at home;Long Term: Improve cardiorespiratory fitness, muscular endurance and strength as measured by increased METs and functional capacity (6MWT)       Able to understand and use rate of perceived exertion (RPE) scale Yes       Intervention Provide education and explanation  on how to use RPE scale       Expected Outcomes Short Term: Able to use RPE daily in rehab to express subjective intensity level;Long Term:  Able to use RPE to guide intensity level when exercising independently       Able to understand and use Dyspnea scale Yes       Intervention Provide education and explanation on how to use Dyspnea scale       Expected Outcomes Short Term: Able to use Dyspnea scale daily in rehab to express subjective sense of shortness of breath during exertion;Long Term: Able to use Dyspnea scale to guide intensity level when  exercising independently       Knowledge and understanding of Target Heart Rate Range (THRR) Yes       Intervention Provide education and explanation of THRR including how the numbers were predicted and where they are located for reference       Expected Outcomes Short Term: Able to state/look up THRR;Long Term: Able to use THRR to govern intensity when exercising independently;Short Term: Able to use daily as guideline for intensity in rehab       Able to check pulse independently Yes       Intervention Provide education and demonstration on how to check pulse in carotid and radial arteries.;Review the importance of being able to check your own pulse for safety during independent exercise       Expected Outcomes Short Term: Able to explain why pulse checking is important during independent exercise;Long Term: Able to check pulse independently and accurately       Understanding of Exercise Prescription Yes       Intervention Provide education, explanation, and written materials on patient's individual exercise prescription       Expected Outcomes Short Term: Able to explain program exercise prescription;Long Term: Able to explain home exercise prescription to exercise independently                Exercise Goals Re-Evaluation :  Exercise Goals Re-Evaluation     Row Name 03/10/21 0754 03/20/21 1614 04/03/21 0844 04/06/21 0809 04/17/21 1039      Exercise Goal Re-Evaluation   Exercise Goals Review Increase Physical Activity;Able to understand and use rate of perceived exertion (RPE) scale;Knowledge and understanding of Target Heart Rate Range (THRR);Understanding of Exercise Prescription;Increase Strength and Stamina;Able to check pulse independently Increase Physical Activity;Increase Strength and Stamina;Understanding of Exercise Prescription Increase Physical Activity;Increase Strength and Stamina Increase Physical Activity;Increase Strength and Stamina Increase Physical Activity;Increase Strength and Stamina   Comments Reviewed RPE and dyspnea scales, THR and program prescription with pt today.  Pt voiced understanding and was given a copy of goals to take home. Liliane Channel is off to a good start in rehab.  He is on level 5 for the NuStep and doing 40 laps already.  We will continue to monitor his progress. Liliane Channel continues to do well.  He has increased to 6 lb for strength work. Reviewed home exercise with pt today.  Pt plans to walk and bike at home  for exercise.  Reviewed THR, pulse, RPE, sign and symptoms, pulse oximetery and when to call 911 or MD.  Also discussed weather considerations and indoor options.  Pt voiced understanding. Rashawn is doing well in rehab.  He tried out the recumbant bike for the first time at level 6 and tolerated it well. He also increased to 37 laps on the track which is the most he has done thus far. We will continue to monitor.   Expected Outcomes Short: Use RPE daily to regulate intensity. Long: Follow program prescription in THR. Short: Clearance to return to rehab regularly Long: Continue to follow program prescription Short: attend consistently Long:  build overall stamina Short: monitor HR when exercising at home Long: exercise independently Short: Continue increasing nu,ber of laps on the track Long: Continue to increase overall MET level            Discharge Exercise Prescription (Final Exercise Prescription  Changes):  Exercise Prescription Changes - 04/17/21 1000       Response to Exercise   Blood Pressure (Admit) 106/56  Blood Pressure (Exit) 110/60    Heart Rate (Admit) 72 bpm    Heart Rate (Exercise) 82 bpm    Heart Rate (Exit) 64 bpm    Rating of Perceived Exertion (Exercise) 13    Symptoms none    Duration Continue with 30 min of aerobic exercise without signs/symptoms of physical distress.    Intensity THRR unchanged      Progression   Progression Continue to progress workloads to maintain intensity without signs/symptoms of physical distress.    Average METs 2.96      Resistance Training   Training Prescription Yes    Weight 6 lb    Reps 10-15      Interval Training   Interval Training No      Recumbant Bike   Level 6    Minutes 15    METs 2.89      NuStep   Level 5    Minutes 15    METs 3.7      REL-XR   Level 1.5    Minutes 15    METs 2      Track   Laps 37    Minutes 15    METs 3.01      Oxygen   Maintain Oxygen Saturation 88% or higher             Nutrition:  Target Goals: Understanding of nutrition guidelines, daily intake of sodium <158m, cholesterol <2034m calories 30% from fat and 7% or less from saturated fats, daily to have 5 or more servings of fruits and vegetables.  Education: All About Nutrition: -Group instruction provided by verbal, written material, interactive activities, discussions, models, and posters to present general guidelines for heart healthy nutrition including fat, fiber, MyPlate, the role of sodium in heart healthy nutrition, utilization of the nutrition label, and utilization of this knowledge for meal planning. Follow up email sent as well. Written material given at graduation. Flowsheet [facility]  Education need identified 03/09/21       Biometrics:  Pre Biometrics - 03/09/21 0924       Pre Biometrics   Height 5' 7.2" (1.707 m)    Weight 198 lb  11.2 oz (90.1 kg)    BMI (Calculated) 30.93    Single Leg Stand 8.1 seconds              Nutrition Therapy Plan and Nutrition Goals:  Nutrition Therapy & Goals - 04/10/21 0934       Nutrition Therapy   Diet Heart healthy, low Na, Diabetes friendly    Protein (specify units) 70g    Fiber 30 grams    Whole Grain Foods 3 servings    Saturated Fats 12 max. grams    Fruits and Vegetables 8 servings/day    Sodium 1.5 grams      Personal Nutrition Goals   Nutrition Goal ST: try [employer] Killer Bread (whole wheat), use meal brainstorming sheet to plan out some meals ideas for himself following 2-3 or 3-4 CHO servings per meal LT: lower A1C    Comments RiLiliane Channelas had a need diagnosis of diabetes 03/19/21 A1C 6.6. RiLiliane Channelas been struggling with knowing how much CHO he should be having at meals and what it looks like in practice. He has a coffee in the am with splenda. This am had oatmeal (rolled oats with skim milk) and a piece of whole wheat toast with peanut butter. He will usually have  left over vegetables and beans for lunch. Both his wife and him cook dinner. He alwasy tries to make sure he gets a vegetable in and chooses whole grains. They use some butter to cook, but also olive oil and canola oil. Discussed heart healthy eating, diabetes friendly eating.      Intervention Plan   Intervention Prescribe, educate and counsel regarding individualized specific dietary modifications aiming towards targeted core components such as weight, hypertension, lipid management, diabetes, heart failure and other comorbidities.    Expected Outcomes Short Term Goal: Understand basic principles of dietary content, such as calories, fat, sodium, cholesterol and nutrients.;Short Term Goal: A plan has been developed with personal nutrition goals set during dietitian appointment.;Long Term Goal: Adherence to prescribed nutrition plan.             Nutrition Assessments:  MEDIFICTS Score Key: ?70 Need to make  dietary changes  40-70 Heart Healthy Diet ? 40 Therapeutic Level Cholesterol Diet  Flowsheet Row Cardiac Rehab from 03/09/2021 in [facility] Cardiac and Pulmonary Rehab  Picture Your Plate Total Score on Admission 57      Picture Your Plate Scores: <28 Unhealthy dietary pattern with much room for improvement. 41-50 Dietary pattern unlikely to meet recommendations for good health and room for improvement. 51-60 More healthful dietary pattern, with some room for improvement.  >60 Healthy dietary pattern, although there may be some specific behaviors that could be improved.    Nutrition Goals Re-Evaluation:  Nutrition Goals Re-Evaluation     [place] Name 04/06/21 0753             Goals   Comment Liliane Channel is scheduled for a nutrition consultation 10/17 at 845am                Nutrition Goals Discharge (Final Nutrition Goals Re-Evaluation):  Nutrition Goals Re-Evaluation - 04/06/21 0753       Goals   Comment Liliane Channel is scheduled for a nutrition consultation 10/17 at 845am             Psychosocial: Target Goals: Acknowledge presence or absence of significant depression and/or stress, maximize coping skills, provide positive support system. Participant is able to verbalize types and ability to use techniques and skills needed for reducing stress and depression.   Education: Stress, Anxiety, and Depression - Group verbal and visual presentation to define topics covered.  Reviews how body is impacted by stress, anxiety, and depression.  Also discusses healthy ways to reduce stress and to treat/manage anxiety and depression.  Written material given at graduation. Flowsheet Row Cardiac Rehab from 04/26/2021 in [facility] Cardiac and Pulmonary Rehab  Date 04/05/21  Educator [employer]  Instruction Review Code 1- Verbalizes Understanding       Education: Sleep Hygiene -Provides group verbal and written instruction about how sleep can affect your health.  Define sleep hygiene, discuss sleep cycles and  impact of sleep habits. Review good sleep hygiene tips.    Initial Review & Psychosocial Screening:  Initial Psych Review & Screening - 03/07/21 0848       Initial Review   Current issues with None Identified      Family Dynamics   Good Support System? Yes   wife   [facility]     Barriers   Psychosocial barriers to participate in program There are no identifiable barriers or psychosocial needs.      Screening Interventions   Interventions Encouraged to exercise;Provide feedback about the scores to participant;To provide support and resources with identified psychosocial needs  Expected Outcomes Short Term goal: Utilizing psychosocial counselor, staff and physician to assist with identification of specific Stressors or current issues interfering with healing process. Setting desired goal for each stressor or current issue identified.;Long Term Goal: Stressors or current issues are controlled or eliminated.;Short Term goal: Identification and review with participant of any Quality of Life or Depression concerns found by scoring the questionnaire.;Long Term goal: The participant improves quality of Life and PHQ9 Scores as seen by post scores and/or verbalization of changes             Quality of Life Scores:   Quality of Life - 03/09/21 0934       Quality of Life   Select Quality of Life      Quality of Life Scores   Health/Function Pre 21.33 %    Socioeconomic Pre 27.86 %    Psych/Spiritual Pre 21.64 %    Family Pre 28.8 %    GLOBAL Pre 23.84 %            Scores of 19 and below usually indicate a poorer quality of life in these areas.  A difference of  2-3 points is a clinically meaningful difference.  A difference of 2-3 points in the total score of the Quality of Life Index has been associated with significant improvement in overall quality of life, self-image, physical symptoms, and general health in studies assessing change in quality of life.  PHQ-9: Recent  Review Flowsheet Data     Depression screen [facility] 2/9 03/09/2021 01/23/2021 07/14/2020   Decreased Interest 0 0 0   Down, Depressed, Hopeless 0 0 0   PHQ - 2 Score 0 0 0   Altered sleeping 0 - -   Tired, decreased energy 1 - -   Change in appetite 1 - -   Feeling bad or failure about yourself  0 - -   Trouble concentrating 0 - -   Moving slowly or fidgety/restless 0 - -   Suicidal thoughts 0 - -   PHQ-9 Score 2 - -   Difficult doing work/chores Not difficult at all - -      Interpretation of Total Score  Total Score Depression Severity:  1-4 = Minimal depression, 5-9 = Mild depression, 10-14 = Moderate depression, 15-19 = Moderately severe depression, 20-27 = Severe depression   Psychosocial Evaluation and Intervention:  Psychosocial Evaluation - 03/07/21 0909       Psychosocial Evaluation & Interventions   Interventions Encouraged to exercise with the program and follow exercise prescription    Comments Liliane Channel has no barriers to attending the program. 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 He stated the symptoms occur when he is swinging his arms.  He lives with his wife, his support person. He also has his church family. He is ready to get started to see if he can gain better control of his symptoms.    Expected Outcomes STG: Liliane Channel attends all scheduled sessions, he is able to work on symptom control with the advice of the exercise and nursing staff. LZTG Liliane Channel has acheived better control of his symptoms and continues to maintain control.    Continue Psychosocial Services  Follow up required by staff             Psychosocial Re-Evaluation:  Psychosocial Re-Evaluation     [place] Name 04/06/21 [phone]  Psychosocial Re-Evaluation   Current issues with Current Stress Concerns       Comments Liliane Channel reports no symptoms of stress depression or anxiety.  He has not had trouble with angina since  starting HT.       Expected Outcomes Short:  continue to exercise to help with stress Long:  maintain positive outlook                Psychosocial Discharge (Final Psychosocial Re-Evaluation):  Psychosocial Re-Evaluation - 04/06/21 0803       Psychosocial Re-Evaluation   Current issues with Current Stress Concerns    Comments Liliane Channel reports no symptoms of stress depression or anxiety.  He has not had trouble with angina since starting HT.    Expected Outcomes Short:  continue to exercise to help with stress Long:  maintain positive outlook             Vocational Rehabilitation: Provide vocational rehab assistance to qualifying candidates.   Vocational Rehab Evaluation & Intervention:  Vocational Rehab - 03/07/21 0857       Initial Vocational Rehab Evaluation & Intervention   Assessment shows need for Vocational Rehabilitation No      Vocational Rehab Re-Evaulation   Comments retired             Education: Education Goals: Education classes will be provided on a variety of topics geared toward better understanding of heart health and risk factor modification. Participant will state understanding/return demonstration of topics presented as noted by education test scores.  Learning Barriers/Preferences:  Learning Barriers/Preferences - 03/07/21 0856       Learning Barriers/Preferences   Learning Barriers None   wears hearing aids   Learning Preferences None             General Cardiac Education Topics:  AED/CPR: - Group verbal and written instruction with the use of models to demonstrate the basic use of the AED with the basic ABC's of resuscitation.   Anatomy and Cardiac Procedures: - Group verbal and visual presentation and models provide information about basic cardiac anatomy and function. Reviews the testing methods done to diagnose heart disease and the outcomes of the test results. Describes the treatment choices: Medical Management, Angioplasty,  or Coronary Bypass Surgery for treating various heart conditions including Myocardial Infarction, Angina, Valve Disease, and Cardiac Arrhythmias.  Written material given at graduation. Flowsheet Row Cardiac Rehab from 04/26/2021 in [facility] Cardiac [facility]  Date 04/26/21  Educator SB  Instruction Review Code 1- Verbalizes Understanding       Medication Safety: - Group verbal and visual instruction to review commonly prescribed medications for heart and lung disease. Reviews the medication, class of the drug, and side effects. Includes the steps to properly store meds and maintain the prescription regimen.  Written material given at graduation.   Intimacy: - Group verbal instruction through game format to discuss how heart and lung disease can affect sexual intimacy. Written material given at graduation..   Know Your Numbers and Heart Failure: - Group verbal and visual instruction to discuss disease risk factors for cardiac and pulmonary disease and treatment options.  Reviews associated critical values for Overweight/Obesity, Hypertension, Cholesterol, and Diabetes.  Discusses basics of heart failure: signs/symptoms and treatments.  Introduces Heart Failure Zone chart for action plan for heart failure.  Written material given at graduation.   Infection Prevention: - Provides verbal and written material to individual with discussion of infection control including proper hand washing and proper equipment cleaning  during exercise session. Flowsheet Row Cardiac Rehab from 04/26/2021 in [facility] Cardiac and Pulmonary Rehab  Education need identified 03/09/21  Date 03/09/21  Educator [place]  Instruction Review Code 1- Verbalizes Understanding       Falls Prevention: - Provides verbal and written material to individual with discussion of falls prevention and safety. Flowsheet Row Cardiac Rehab from 04/26/2021 in [facility] Cardiac and Pulmonary Rehab  Education need identified 03/09/21  Date  03/09/21  Educator [place]  Instruction Review Code 1- Verbalizes Understanding       Other: -Provides group and verbal instruction on various topics (see comments)   Knowledge Questionnaire Score:  Knowledge Questionnaire Score - 03/09/21 2585       Knowledge Questionnaire Score   Pre Score 23/26: MI, Nutrition, Exercise             Core Components/Risk Factors/Patient Goals at Admission:  Personal Goals and Risk Factors at Admission - 03/09/21 1127       Core Components/Risk Factors/Patient Goals on Admission    Weight Management Yes;Weight Loss    Intervention Weight Management: Develop a combined nutrition and exercise program designed to reach desired caloric intake, while maintaining appropriate intake of nutrient and fiber, sodium and fats, and appropriate energy expenditure required for the weight goal.;Weight Management: Provide education and appropriate resources to help participant work on and attain dietary goals.;Weight Management/Obesity: Establish reasonable short term and long term weight goals.    Admit Weight 198 lb (89.8 kg)    Goal Weight: Short Term 193 lb (87.5 kg)    Goal Weight: Long Term 180 lb (81.6 kg)    Expected Outcomes Short Term: Continue to assess and modify interventions until short term weight is achieved;Long Term: Adherence to nutrition and physical activity/exercise program aimed toward attainment of established weight goal;Weight Loss: Understanding of general recommendations for a balanced deficit meal plan, which promotes 1-2 lb weight loss per week and includes a negative energy balance of [phone] kcal/d;Understanding recommendations for meals to include 15-35% energy as protein, 25-35% energy from fat, 35-60% energy from carbohydrates, less than 241m of dietary cholesterol, 20-35 gm of total fiber daily;Understanding of distribution of calorie intake throughout the day with the consumption of 4-5 meals/snacks    Hypertension Yes     Intervention Provide education on lifestyle modifcations including regular physical activity/exercise, weight management, moderate sodium restriction and increased consumption of fresh fruit, vegetables, and low fat dairy, alcohol moderation, and smoking cessation.;Monitor prescription use compliance.    Expected Outcomes Short Term: Continued assessment and intervention until BP is < 140/968mHG in hypertensive participants. < 130/8066mG in hypertensive participants with diabetes, heart failure or chronic kidney disease.;Long Term: Maintenance of blood pressure at goal levels.    Lipids Yes    Intervention Provide education and support for participant on nutrition & aerobic/resistive exercise along with prescribed medications to achieve LDL <27m88mDL >40mg49m Expected Outcomes Short Term: Participant states understanding of desired cholesterol values and is compliant with medications prescribed. Participant is following exercise prescription and nutrition guidelines.;Long Term: Cholesterol controlled with medications as prescribed, with individualized exercise RX and with personalized nutrition plan. Value goals: LDL < 27mg,58m > 40 mg.             Education:Diabetes - Individual verbal and written instruction to review signs/symptoms of diabetes, desired ranges of glucose level fasting, after meals and with exercise. Acknowledge that pre and post exercise glucose checks will be done for 3 sessions at entry  of program.   Core Components/Risk Factors/Patient Goals Review:   Goals and Risk Factor Review     Row Name 04/06/21 0801             Core Components/Risk Factors/Patient Goals Review   Personal Goals Review Weight Management/Obesity;Hypertension       Review Liliane Channel has lost around 20 by his home scale in the last months.  His goal is 180-185 - he was 188 at home today.  He has been watching portion sizes and avoiding alcohol to help lose weight.  He does monitor BP at home.  It is  124/66 today at [facility]       Expected Outcomes Short: contineu heart healthy dietary changes Long: manage risk factors long term                Core Components/Risk Factors/Patient Goals at Discharge (Final Review):   Goals and Risk Factor Review - 04/06/21 0801       Core Components/Risk Factors/Patient Goals Review   Personal Goals Review Weight Management/Obesity;Hypertension    Review Liliane Channel has lost around 20 by his home scale in the last months.  His goal is 180-185 - he was 188 at home today.  He has been watching portion sizes and avoiding alcohol to help lose weight.  He does monitor BP at home.  It is 124/66 today at [facility]    Expected Outcomes Short: contineu heart healthy dietary changes Long: manage risk factors long term             ITP Comments:  ITP Comments     Row Name 03/07/21 0904 03/09/21 0923 03/10/21 0753 03/20/21 1613 03/29/21 0802   ITP Comments Virtual orientation call completed today. he has an appointment on Date: 03/09/2021  for EP eval and gym Orientation.  Documentation of diagnosis can be found in [facility] Date: 08/25/02022 . Completed 6MWT and gym orientation. Initial ITP created and sent for review to Dr. Emily Filbert, Medical Director. First full day of exercise!  Patient was oriented to gym and equipment including functions, settings, policies, and procedures.  Patient's individual exercise prescription and treatment plan were reviewed.  All starting workloads were established based on the results of the 6 minute walk test done at initial orientation visit.  The plan for exercise progression was also introduced and progression will be customized based on patient's performance and goals. Pt called out today with ED visit and emergent sinus surgery. He was encouraged to get clearance to return to rehab next week. Pt received clearance note to return to rehab. Pt able to follow exercise prescription today without complaint.  Will continue to monitor for progression.     [place] Name 03/29/21 1403 04/10/21 0934 04/26/21 0802       ITP Comments 30 day review completed. ITP sent to Dr. Emily Filbert, Medical Director of Cardiac Rehab. Continue with ITP unless changes are made by physician. Completed initial RD consultation 30 Day review completed. Medical Director ITP review done, changes made as directed, and signed approval by Medical Director.              Comments:  30 Day review completed. Medical Director ITP review done, changes made as directed, and signed approval by Medical Director.

## 2021-05-01 ENCOUNTER — Other Ambulatory Visit: Payer: Self-pay

## 2021-05-01 ENCOUNTER — Encounter: Payer: Medicare Other | Admitting: *Deleted

## 2021-05-01 DIAGNOSIS — I208 Other forms of angina pectoris: Secondary | ICD-10-CM | POA: Diagnosis not present

## 2021-05-01 NOTE — Progress Notes (Signed)
Daily Session Note  Patient Details  Name: Daniel Holden MRN: [id number] Date of Birth: 02/15/1946 Referring Provider:   Flowsheet Row Cardiac Rehab from 03/09/2021 in [facility] Cardiac and Pulmonary Rehab  Referring Provider Fransico Him MD       Encounter Date: 05/01/2021  Check In:  Session Check In - 05/01/21 0815       Check-In   Supervising physician immediately available to respond to emergencies See telemetry face sheet for immediately available ER MD    Location [facility]-Cardiac & Pulmonary Rehab    Staff Present Heath Lark, RN, BSN, Laveda Norman, BS, ACSM CEP, Exercise Physiologist;Joseph Sumner, [place]    Virtual Visit No    Medication changes reported     No    Fall or balance concerns reported    No    Warm-up and Cool-down Performed on first and last piece of equipment    Resistance Training Performed Yes    VAD Patient? No    PAD/SET Patient? No      Pain Assessment   Currently in Pain? No/denies                Social History   Tobacco Use  Smoking Status Never  Smokeless Tobacco Never    Goals Met:  Independence with exercise equipment Exercise tolerated well No report of concerns or symptoms today  Goals Unmet:  Not Applicable  Comments: Pt able to follow exercise prescription today without complaint.  Will continue to monitor for progression.    Dr. Emily Filbert is Medical Director for [place].  Dr. Ottie Glazier is Medical Director for [facility] Pulmonary Rehabilitation.

## 2021-05-02 ENCOUNTER — Other Ambulatory Visit: Payer: Self-pay

## 2021-05-02 ENCOUNTER — Encounter (HOSPITAL_BASED_OUTPATIENT_CLINIC_OR_DEPARTMENT_OTHER): Payer: Self-pay | Admitting: Family

## 2021-05-02 ENCOUNTER — Ambulatory Visit (INDEPENDENT_AMBULATORY_CARE_PROVIDER_SITE_OTHER): Payer: Medicare Other | Admitting: Family

## 2021-05-02 VITALS — BP 108/66 | HR 53 | Ht 66.0 in | Wt 201.0 lb

## 2021-05-02 DIAGNOSIS — E785 Hyperlipidemia, unspecified: Secondary | ICD-10-CM | POA: Diagnosis not present

## 2021-05-02 DIAGNOSIS — Z01810 Encounter for preprocedural cardiovascular examination: Secondary | ICD-10-CM | POA: Diagnosis not present

## 2021-05-02 DIAGNOSIS — I10 Essential (primary) hypertension: Secondary | ICD-10-CM

## 2021-05-02 DIAGNOSIS — I25118 Atherosclerotic heart disease of native coronary artery with other forms of angina pectoris: Secondary | ICD-10-CM

## 2021-05-02 NOTE — Progress Notes (Signed)
Office Visit    Patient Name: Daniel Holden Date of Encounter: 05/02/2021  PCP:  Cletis [place], MD   [place]  Cardiologist:  Fransico Him, MD  Advanced Practice Provider:  No care team member to display Electrophysiologist:  None   Chief Complaint    Daniel Holden is a 75 y.o. 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     Carpal tunnel syndrome    HLD (hyperlipidemia)    Hypertension    Lung nodule    Obesity    Pancreatitis 2015   Sleep apnea    Past Surgical History:  Procedure Laterality Date   CARPAL TUNNEL RELEASE     ETHMOIDECTOMY Right 03/20/2021   Procedure: ANTERIOR ETHMOIDECTOMY;  Surgeon: Clyde Canterbury, MD;  Location: [facility] ORS;  Service: ENT;  Laterality: Right;   IMAGE GUIDED SINUS SURGERY N/A 03/20/2021   Procedure: IMAGE GUIDED SINUS SURGERY;  Surgeon: Clyde Canterbury, MD;  Location: [facility] ORS;  Service: ENT;  Laterality: N/A;   KNEE ARTHROSCOPY WITH MEDIAL MENISECTOMY Left 06/25/2019   Procedure: KNEE ARTHROSCOPY WITH MEDIAL MENISECTOMY;  Surgeon: Thornton Park, MD;  Location: [facility] ORS;  Service: Orthopedics;  Laterality: Left;   LEFT HEART CATH AND CORONARY ANGIOGRAPHY N/A 10/04/2020   Procedure: LEFT HEART CATH AND CORONARY ANGIOGRAPHY;  Surgeon: Leonie Man, MD;  Location: [facility];  Service: Cardiovascular;  Laterality: N/A;   MAXILLARY ANTROSTOMY Right 03/20/2021   Procedure: MAXILLARY ANTROSTOMY WITH TISSUE REMOVAL;  Surgeon: Clyde Canterbury, MD;  Location: [facility] ORS;  Service: ENT;  Laterality: Right;    SHOULDER SURGERY     TONSILLECTOMY      Allergies  Allergies  Allergen Reactions   Metronidazole Other (See Comments)    Patient stated that he had a moderate level of pain with his allergic reaction.    Dilaudid [Hydromorphone Hcl] Nausea And Vomiting    History of Present Illness    Daniel Holden is a 75 y.o. male with a hx of anxiety, hypertension, obesity, coronary artery disease last seen 03/15/21  Summer 2021 he started waking in middle of the night with severe pains.  Exercise tolerance test was normal.  He was diagnosed with severe carpal tunnel and underwent right carpal tunnel repair.  Extensive work-up with MRI of spine and started on gabapentin with no improvement.  He then started developing chest pain with exertion.  Coronary CTA with high calcium score 4630 with severely calcified RCA and LAD. Cardiac cath 10/04/20 severe single vessel CAD with occluded RCA with extensive calcifications and multiple different lesions including 90% proxm and distal lesions, 50% oLCx and 50-60% dLCx and 40% pLAD with heavily calcified vessels with bridging collaterals from the septal perforator and diagonal to the PDA. Medical management was recommended. He was started on Ranexa. Imdur has since been initiated. It was increased via phone message 01/22/21.  He was seen 02/16/2021.  Hydrochlorothiazide has been stopped by primary care due to hypotension.  His chest pain had improved since initiation of Imdur.  He was referred to cardiac rehab.  He is seen by Dr. Radford Pax 03/15/2021.  He was without anginal chest pain.  He did not headache on Imdur despite reducing dose to 60 mg.  Imdur was stopped and amlodipine initiated.  He presents today for follow up. He was seen in the urgent care then ED September for severe sinus infection. Tells me they went up his nostril and did a scraping. Was in the hospital for a week. 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 Tells me his  headaches have completely recovered and not recurred. Thinks they were related to sinus infection rather than Imdur. He is participating in cardiac rehab three times were week which he enoys. Tells me he is "keenly aware" to notice any dyspnea when he over exerts he will back off. Reports no shortness of breath nor dyspnea on exertion. Reports no chest pain, pressure, or tightness. No edema, orthopnea, PND. Reports no palpitations.  Still with intermittent arm pain which is overall improving. Has routine colonoscopy and endoscopy upcoming.   EKGs/Labs/Other Studies Reviewed:   The following studies were reviewed today: Cardiac cath 10/04/2020 Diagnostic Dominance: Right     EKG:  EKG ordered today. The EKG performed today demonstrates SB 53 bpm with first degree AV block PR 210 ms.   Recent Labs: 03/19/2021: ALT 7 03/21/2021: BUN 22; Creatinine, Ser 0.85; Potassium 4.3; Sodium 131 03/22/2021: Hemoglobin 11.0; Platelets 267  Recent Lipid Panel    Component Value Date/Time   CHOL 104 11/07/2020 0741   CHOL 83 09/27/2013 0319   TRIG 111 11/07/2020 0741   TRIG 188 09/27/2013 0319   HDL 58 11/07/2020 0741   HDL 28 (L) 09/27/2013 0319   CHOLHDL 1.8 11/07/2020 0741   VLDL 38 09/27/2013 0319   LDLCALC 26 11/07/2020 0741   LDLCALC 17 09/27/2013 0319   Home Medications   Current Meds  Medication Sig   ALPRAZolam (XANAX) 0.25 MG tablet Take 1 tablet (0.25 mg total) by mouth at bedtime as needed for sleep.   amLODipine (NORVASC) 5 MG tablet Take 1 tablet (5 mg total) by mouth daily.   aspirin 81 MG EC tablet Take 81 mg by mouth daily.   atorvastatin (LIPITOR) 80 MG tablet Take 1 tablet (80 mg total) by mouth daily.   benazepril (LOTENSIN) 20 MG tablet Take 1 tablet (20 mg total) by mouth daily.   Cholecalciferol 250 MCG (10000 UT) TABS Take 1,000 Units by mouth daily.   cyanocobalamin 1000 MCG tablet Take 1,000 mcg by mouth daily.   fluticasone (FLONASE) 50 MCG/ACT nasal spray Place 2  sprays into both nostrils daily.    melatonin 3 MG TABS tablet Take 10 mg by mouth at bedtime.   metoprolol tartrate (LOPRESSOR) 50 MG tablet Take 1 tablet (50 mg total) by mouth 2 (two) times daily.   naproxen sodium (ALEVE) 220 MG tablet Take 1 tablet (220 mg total) by mouth 2 (two) times daily as needed.   omeprazole (PRILOSEC OTC) 20 MG tablet Take 10 mg by mouth daily.   prednisoLONE acetate (PRED FORTE) 1 % ophthalmic suspension As directed   ranolazine (RANEXA) 1000 MG SR tablet Take 1 tablet (1,000 mg total) by mouth 2 (two) times daily.   UNABLE TO FIND 2 (two) times daily. Med Name: Budesonide 0.6 mg/ml + Gentamicin 80 mg/ml nasal rinse     Review of Systems      All other systems reviewed and are otherwise negative except as noted above.  Physical Exam    VS:  BP 108/66   Pulse (!) 53   Ht 5\' 6"  (1.676 m)   Wt 201 lb (91.2 kg)   BMI 32.44 kg/m  , BMI Body mass index is 32.44 kg/m.  Wt Readings from Last 3 Encounters:  05/02/21 201 lb (91.2 kg)  04/24/21 198 lb 8 oz (90 kg)  03/20/21 197 lb 15.6 oz (89.8 kg)     GEN: Well nourished, well developed, in no acute distress. HEENT: normal. Neck: Supple, no JVD, carotid bruits, or masses. Cardiac: bradycardia, RRR, no murmurs, rubs, or gallops. No clubbing, cyanosis, edema.  Radials/PT 2+ and equal bilaterally.  Respiratory:  Respirations regular and unlabored, clear to auscultation bilaterally. GI: Soft, nontender, nondistended. MS: No deformity or atrophy. Skin: Warm and dry, no rash. Neuro:  Strength and sensation are intact. Psych: Normal affect.  Assessment & Plan    Preoperative cardiovascular clearance -upcoming colonoscopy 06/27/2021. According to the Revised Cardiac Risk Index (RCRI), his Perioperative Risk of Major Cardiac Event is (%): 6.6. His  Functional Capacity in METs is: 7.01 according to the [place] Activity Status Index (DASI). He is without anginal symptoms and acceptable risk for planned procedure  without additional cardiovascular testing. Will route to requesting party so they are aware. Aspirin does not need to be routinely held prior to colonoscopy.   Bradycardia - Stable finding by EKG today. Asymptomatic with no lightheadedness, dizziness. Continue current dose Metoprolol. Reduce dose if becomes symptomatic.   CAD -cardiac cath 10/04/2020 severe single-vessel CAD with occluded RCA with extensive calcifications and multiple different lesions including 90% proximal and distal lesions, 50% ostial LCx, 50 to 60% distal LCx and 40% proximal LAD with heavily calcified vessels with bridging collaterals from septal perforator and diagonal to the PDA.  Recommended for medical management. Antianginal agents include Ranexa and Amlodipine. Previous headache with Imdur though found to be related to sinus infection and could reinitiate for any future anginal symptoms. Heart healthy diet and regular cardiovascular exercise encouraged.    HTN - BP well controlled. Continue current antihypertensive regimen.  He BP is low normal. Asymptomatic with no lightheadedness nor dizziness. If he has persistent SBP <100 he will contact our office and consider reduced dose Benazepril or Amlodipine.   HLD - Continue Atorvasatin 80mg  daily. 11/2020 LDL 26. Denies myalgias.   Disposition: Follow up in 4 months with Dr. Radford Pax or APP.  Signed, Loel Dubonnet, NP 05/02/2021, 10:04 AM [place]

## 2021-05-02 NOTE — Patient Instructions (Signed)
Medication Instructions:  Your Physician recommend you continue on your current medication as directed.    *If you need a refill on your cardiac medications before your next appointment, please call your pharmacy*   Lab Work: None ordered today If you have labs (blood work) drawn today and your tests are completely normal, you will receive your results only by: [place] (if you have MyChart) OR A paper copy in the mail If you have any lab test that is abnormal or we need to change your treatment, we will call you to review the results.   Testing/Procedures: None ordered today   Follow-Up: At [facility], you and your health needs are our priority.  As part of our continuing mission to provide you with exceptional heart care, we have created designated Provider Care Teams.  These Care Teams include your primary Cardiologist (physician) and Advanced Practice Providers (APPs -  Physician Assistants and Nurse Practitioners) who all work together to provide you with the care you need, when you need it.  We recommend signing up for the patient portal called "MyChart".  Sign up information is provided on this After Visit Summary.  MyChart is used to connect with patients for Virtual Visits (Telemedicine).  Patients are able to view lab/test results, encounter notes, upcoming appointments, etc.  Non-urgent messages can be sent to your provider as well.   To learn more about what you can do with MyChart, go to [email or web address].    Your next appointment:   4 month(s)  The format for your next appointment:   In Person  Provider:   Fransico Him, MD  or APPs If MD is not listed, click here to update    :1}    Other Instructions If your blood pressure is persistently less than 100bpm we will consider reducing your blood pressure medications. If you notice these low readings persistently, please call and let us know or send a MyChart message.

## 2021-05-03 DIAGNOSIS — I208 Other forms of angina pectoris: Secondary | ICD-10-CM

## 2021-05-03 NOTE — Progress Notes (Signed)
Daily Session Note  Patient Details  Name: Daniel Holden MRN: [id number] Date of Birth: Jun 24, 1946 Referring Provider:   Flowsheet Row Cardiac Rehab from 03/09/2021 in [facility] Cardiac and Pulmonary Rehab  Referring Provider Fransico Him MD       Encounter Date: 05/03/2021  Check In:  Session Check In - 05/03/21 0824       Check-In   Supervising physician immediately available to respond to emergencies See telemetry face sheet for immediately available ER MD    Location [facility]-Cardiac & Pulmonary Rehab    Staff Present Birdie Sons, MPA, RN;Joseph [place], [place], MA, RCEP, CCRP, CCET    Virtual Visit No    Medication changes reported     No    Fall or balance concerns reported    No    Tobacco Cessation No Change    Warm-up and Cool-down Performed on first and last piece of equipment    Resistance Training Performed Yes    VAD Patient? No    PAD/SET Patient? No      Pain Assessment   Currently in Pain? No/denies                Social History   Tobacco Use  Smoking Status Never  Smokeless Tobacco Never    Goals Met:  Independence with exercise equipment Exercise tolerated well No report of concerns or symptoms today Strength training completed today  Goals Unmet:  Not Applicable  Comments: Pt able to follow exercise prescription today without complaint.  Will continue to monitor for progression.    Dr. Emily Filbert is Medical Director for [place].  Dr. Ottie Glazier is Medical Director for [facility] Pulmonary Rehabilitation.

## 2021-05-05 ENCOUNTER — Encounter: Payer: Medicare Other | Admitting: *Deleted

## 2021-05-05 ENCOUNTER — Other Ambulatory Visit: Payer: Self-pay

## 2021-05-05 DIAGNOSIS — I208 Other forms of angina pectoris: Secondary | ICD-10-CM

## 2021-05-05 NOTE — Progress Notes (Signed)
Daily Session Note  Patient Details  Name: Daniel Holden MRN: [id number] Date of Birth: May 16, 1946 Referring Provider:   Flowsheet Row Cardiac Rehab from 03/09/2021 in [facility] Cardiac and Pulmonary Rehab  Referring Provider Fransico Him MD       Encounter Date: 05/05/2021  Check In:  Session Check In - 05/05/21 0839       Check-In   Supervising physician immediately available to respond to emergencies See telemetry face sheet for immediately available ER MD    Location [facility]-Cardiac & Pulmonary Rehab    Staff Present Heath Lark, RN, BSN, CCRP;Laureen Owens Shark, BS, RRT, CPFT;Joseph [place], [place]    Virtual Visit No    Medication changes reported     No    Fall or balance concerns reported    No    Warm-up and Cool-down Performed on first and last piece of equipment    Resistance Training Performed No    VAD Patient? No    PAD/SET Patient? No      Pain Assessment   Currently in Pain? No/denies                Social History   Tobacco Use  Smoking Status Never  Smokeless Tobacco Never    Goals Met:  Independence with exercise equipment Exercise tolerated well No report of concerns or symptoms today  Goals Unmet:  Not Applicable  Comments: Pt able to follow exercise prescription today without complaint.  Will continue to monitor for progression.    Dr. Emily Filbert is Medical Director for [place].  Dr. Ottie Glazier is Medical Director for [facility] Pulmonary Rehabilitation.

## 2021-05-08 DIAGNOSIS — H2511 Age-related nuclear cataract, right eye: Secondary | ICD-10-CM | POA: Diagnosis not present

## 2021-05-10 ENCOUNTER — Other Ambulatory Visit: Payer: Self-pay

## 2021-05-10 DIAGNOSIS — I208 Other forms of angina pectoris: Secondary | ICD-10-CM | POA: Diagnosis not present

## 2021-05-10 NOTE — Progress Notes (Signed)
Daily Session Note  Patient Details  Name: Daniel Holden MRN: [id number] Date of Birth: March 25, 1946 Referring Provider:   Flowsheet Row Cardiac Rehab from 03/09/2021 in [facility] Cardiac and Pulmonary Rehab  Referring Provider Fransico Him MD       Encounter Date: 05/10/2021  Check In:  Session Check In - 05/10/21 0747       Check-In   Supervising physician immediately available to respond to emergencies See telemetry face sheet for immediately available ER MD    Location [facility]-Cardiac & Pulmonary Rehab    Staff Present Birdie Sons, MPA, Elveria Rising, BA, ACSM CEP, Exercise Physiologist;Joseph Tessie Fass, [place]    Virtual Visit No    Medication changes reported     No    Fall or balance concerns reported    No    Tobacco Cessation No Change    Warm-up and Cool-down Performed on first and last piece of equipment    Resistance Training Performed Yes    VAD Patient? No    PAD/SET Patient? No      Pain Assessment   Currently in Pain? No/denies                Social History   Tobacco Use  Smoking Status Never  Smokeless Tobacco Never    Goals Met:  Independence with exercise equipment Exercise tolerated well No report of concerns or symptoms today Strength training completed today  Goals Unmet:  Not Applicable  Comments: Pt able to follow exercise prescription today without complaint.  Will continue to monitor for progression.    Dr. Emily Filbert is Medical Director for [place].  Dr. Ottie Glazier is Medical Director for [facility] Pulmonary Rehabilitation.

## 2021-05-11 DIAGNOSIS — J301 Allergic rhinitis due to pollen: Secondary | ICD-10-CM | POA: Diagnosis not present

## 2021-05-11 DIAGNOSIS — J329 Chronic sinusitis, unspecified: Secondary | ICD-10-CM | POA: Diagnosis not present

## 2021-05-12 ENCOUNTER — Other Ambulatory Visit: Payer: Self-pay

## 2021-05-12 ENCOUNTER — Encounter: Payer: Medicare Other | Admitting: *Deleted

## 2021-05-12 DIAGNOSIS — I208 Other forms of angina pectoris: Secondary | ICD-10-CM | POA: Diagnosis not present

## 2021-05-12 NOTE — Progress Notes (Signed)
Daily Session Note  Patient Details  Name: VIKTOR PHILIPP MRN: [id number] Date of Birth: 1945-08-20 Referring Provider:   Flowsheet Row Cardiac Rehab from 03/09/2021 in [facility] Cardiac and Pulmonary Rehab  Referring Provider Fransico Him MD       Encounter Date: 05/12/2021  Check In:  Session Check In - 05/12/21 0809       Check-In   Supervising physician immediately available to respond to emergencies See telemetry face sheet for immediately available ER MD    Location [facility]-Cardiac & Pulmonary Rehab    Staff Present Heath Lark, RN, BSN, CCRP;Jessica [place], MA, RCEP, CCRP, CCET;Joseph [place], [place]    Virtual Visit No    Medication changes reported     No    Fall or balance concerns reported    No    Warm-up and Cool-down Performed on first and last piece of equipment    Resistance Training Performed Yes    VAD Patient? No    PAD/SET Patient? No      Pain Assessment   Currently in Pain? No/denies                Social History   Tobacco Use  Smoking Status Never  Smokeless Tobacco Never    Goals Met:  Independence with exercise equipment Exercise tolerated well No report of concerns or symptoms today  Goals Unmet:  Not Applicable  Comments: Pt able to follow exercise prescription today without complaint.  Will continue to monitor for progression.    Dr. Emily Filbert is Medical Director for [place].  Dr. Ottie Glazier is Medical Director for [facility] Pulmonary Rehabilitation.

## 2021-05-15 ENCOUNTER — Encounter: Payer: Medicare Other | Admitting: *Deleted

## 2021-05-15 ENCOUNTER — Other Ambulatory Visit: Payer: Self-pay

## 2021-05-15 DIAGNOSIS — I208 Other forms of angina pectoris: Secondary | ICD-10-CM

## 2021-05-15 NOTE — Progress Notes (Signed)
Daily Session Note  Patient Details  Name: Daniel Holden MRN: [id number] Date of Birth: [birth year] Referring Provider:   Flowsheet Row Cardiac Rehab from 03/09/2021 in [facility] Cardiac and Pulmonary Rehab  Referring Provider Fransico Him MD       Encounter Date: 05/15/2021  Check In:  Session Check In - 05/15/21 0836       Check-In   Supervising physician immediately available to respond to emergencies See telemetry face sheet for immediately available ER MD    Location [facility]-Cardiac & Pulmonary Rehab    Staff Present Heath Lark, RN, BSN, Laveda Norman, BS, ACSM CEP, Exercise Physiologist;Joseph [place], [place]    Virtual Visit No    Medication changes reported     No    Fall or balance concerns reported    No    Warm-up and Cool-down Performed on first and last piece of equipment    Resistance Training Performed Yes    VAD Patient? No    PAD/SET Patient? No      Pain Assessment   Currently in Pain? No/denies                Social History   Tobacco Use  Smoking Status Never  Smokeless Tobacco Never    Goals Met:  Independence with exercise equipment Exercise tolerated well No report of concerns or symptoms today  Goals Unmet:  Not Applicable  Comments: Pt able to follow exercise prescription today without complaint.  Will continue to monitor for progression.    Dr. Emily Filbert is Medical Director for [place].  Dr. Ottie Glazier is Medical Director for [facility] Pulmonary Rehabilitation.

## 2021-05-16 ENCOUNTER — Other Ambulatory Visit: Payer: Self-pay | Admitting: Internal Medicine

## 2021-05-17 ENCOUNTER — Other Ambulatory Visit: Payer: Self-pay

## 2021-05-17 DIAGNOSIS — I208 Other forms of angina pectoris: Secondary | ICD-10-CM | POA: Diagnosis not present

## 2021-05-17 DIAGNOSIS — Z20828 Contact with and (suspected) exposure to other viral communicable diseases: Secondary | ICD-10-CM | POA: Diagnosis not present

## 2021-05-17 NOTE — Progress Notes (Signed)
Daily Session Note  Patient Details  Name: Daniel Holden MRN: [id number] Date of Birth: 30-Jan-1946 Referring Provider:   Flowsheet Row Cardiac Rehab from 03/09/2021 in [facility] Cardiac and Pulmonary Rehab  Referring Provider Fransico Him MD       Encounter Date: 05/17/2021  Check In:  Session Check In - 05/17/21 0802       Check-In   Supervising physician immediately available to respond to emergencies See telemetry face sheet for immediately available ER MD    Location [facility]-Cardiac & Pulmonary Rehab    Staff Present Birdie Sons, MPA, Elveria Rising, BA, ACSM CEP, Exercise Physiologist;Joseph Tessie Fass, [place]    Virtual Visit No    Medication changes reported     No    Fall or balance concerns reported    No    Tobacco Cessation No Change    Warm-up and Cool-down Performed on first and last piece of equipment    Resistance Training Performed Yes    VAD Patient? No    PAD/SET Patient? No      Pain Assessment   Currently in Pain? No/denies                Social History   Tobacco Use  Smoking Status Never  Smokeless Tobacco Never    Goals Met:  Independence with exercise equipment Exercise tolerated well No report of concerns or symptoms today Strength training completed today  Goals Unmet:  Not Applicable  Comments: Pt able to follow exercise prescription today without complaint.  Will continue to monitor for progression.    Dr. Emily Filbert is Medical Director for [place].  Dr. Ottie Glazier is Medical Director for [facility] Pulmonary Rehabilitation.

## 2021-05-22 ENCOUNTER — Encounter: Payer: Medicare Other | Admitting: *Deleted

## 2021-05-22 ENCOUNTER — Other Ambulatory Visit: Payer: Self-pay

## 2021-05-22 ENCOUNTER — Encounter: Payer: Self-pay | Admitting: Cardiology

## 2021-05-22 DIAGNOSIS — I208 Other forms of angina pectoris: Secondary | ICD-10-CM

## 2021-05-22 NOTE — Progress Notes (Signed)
Daily Session Note  Patient Details  Name: Daniel Holden MRN: [id number] Date of Birth: 1945/09/13 Referring Provider:   Flowsheet Row Cardiac Rehab from 03/09/2021 in [facility] Cardiac and Pulmonary Rehab  Referring Provider Fransico Him MD       Encounter Date: 05/22/2021  Check In:  Session Check In - 05/22/21 0829       Check-In   Supervising physician immediately available to respond to emergencies See telemetry face sheet for immediately available ER MD    Location [facility]-Cardiac & Pulmonary Rehab    Staff Present Heath Lark, RN, BSN, Laveda Norman, BS, ACSM CEP, Exercise Physiologist;Joseph [place], [place]    Virtual Visit No    Medication changes reported     No    Fall or balance concerns reported    No    Warm-up and Cool-down Performed on first and last piece of equipment    Resistance Training Performed Yes    VAD Patient? No    PAD/SET Patient? No      Pain Assessment   Currently in Pain? No/denies                Social History   Tobacco Use  Smoking Status Never  Smokeless Tobacco Never    Goals Met:  Independence with exercise equipment Exercise tolerated well No report of concerns or symptoms today  Goals Unmet:  Not Applicable  Comments: Pt able to follow exercise prescription today without complaint.  Will continue to monitor for progression.    Dr. Emily Filbert is Medical Director for [place].  Dr. Ottie Glazier is Medical Director for [facility] Pulmonary Rehabilitation.

## 2021-05-23 ENCOUNTER — Other Ambulatory Visit: Payer: Self-pay | Admitting: Cardiology

## 2021-05-23 DIAGNOSIS — Z79899 Other long term (current) drug therapy: Secondary | ICD-10-CM | POA: Diagnosis not present

## 2021-05-23 DIAGNOSIS — G4733 Obstructive sleep apnea (adult) (pediatric): Secondary | ICD-10-CM | POA: Diagnosis not present

## 2021-05-23 DIAGNOSIS — D508 Other iron deficiency anemias: Secondary | ICD-10-CM | POA: Diagnosis not present

## 2021-05-23 DIAGNOSIS — D531 Other megaloblastic anemias, not elsewhere classified: Secondary | ICD-10-CM | POA: Diagnosis not present

## 2021-05-23 DIAGNOSIS — Z9989 Dependence on other enabling machines and devices: Secondary | ICD-10-CM | POA: Diagnosis not present

## 2021-05-23 DIAGNOSIS — I25118 Atherosclerotic heart disease of native coronary artery with other forms of angina pectoris: Secondary | ICD-10-CM | POA: Diagnosis not present

## 2021-05-23 DIAGNOSIS — Z125 Encounter for screening for malignant neoplasm of prostate: Secondary | ICD-10-CM | POA: Diagnosis not present

## 2021-05-23 DIAGNOSIS — R739 Hyperglycemia, unspecified: Secondary | ICD-10-CM | POA: Diagnosis not present

## 2021-05-23 DIAGNOSIS — Z23 Encounter for immunization: Secondary | ICD-10-CM | POA: Diagnosis not present

## 2021-05-23 DIAGNOSIS — J320 Chronic maxillary sinusitis: Secondary | ICD-10-CM | POA: Diagnosis not present

## 2021-05-23 DIAGNOSIS — Z1159 Encounter for screening for other viral diseases: Secondary | ICD-10-CM | POA: Diagnosis not present

## 2021-05-23 MED ORDER — LOSARTAN POTASSIUM 50 MG PO TABS: 50.0000 mg | ORAL_TABLET | Freq: Every day | ORAL | 3 refills | Status: DC

## 2021-05-23 MED ORDER — LOSARTAN POTASSIUM 50 MG PO TABS: 50.0000 mg | ORAL_TABLET | Freq: Every day | ORAL | 0 refills | Status: DC

## 2021-05-23 NOTE — Telephone Encounter (Signed)
Spoke with the patient who states that he saw his new PCP yesterday. They discussed his cardiac medications and he has some questions about making changes.  Patient is wondering if he should go back on Imdur. He states that he was taken off of it due to headaches but it was later thought that his headaches were coming from a sinus problem that he was having. He was then started on amlodipine. He is wondering about going back on the Imdur and stopping at amlodipine. He states that he has been doing really well at cardiac rehab and does not have any anginal symptoms while there. He states that occasionally he will have some pain while doing things around his house. It does not last long and is relieved by rest. Patient also reports concerns about benazepril causing a dry cough. He has noticed it every since going on benazepril and his PCP mentioned that he should ask his cardiologist about switching to another medications.  Patient reports that his blood pressure has been very well controlled running 100-110s/60-70s.  Advised patient that I would send his questions over to Dr. Radford Pax for advisement.

## 2021-05-23 NOTE — Telephone Encounter (Signed)
Spoke with the patient and advised him on recommendations from Dr. Radford Pax to change benazepril to losartan. Patient verbalized understanding. Rx has been sent in.  Patient reports that chest pain is not significant and is agreeable to staying on amlodipine.  He will check his BP daily for a week and call us with a list of his readings.

## 2021-05-24 ENCOUNTER — Other Ambulatory Visit: Payer: Self-pay

## 2021-05-24 ENCOUNTER — Encounter: Payer: Self-pay | Admitting: *Deleted

## 2021-05-24 VITALS — Ht 67.2 in | Wt 197.5 lb

## 2021-05-24 DIAGNOSIS — I208 Other forms of angina pectoris: Secondary | ICD-10-CM | POA: Diagnosis not present

## 2021-05-24 NOTE — Progress Notes (Signed)
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[place])  Patient's Home Medications on Admission:  Current Outpatient Medications:    ALPRAZolam (XANAX) 0.25 MG tablet, Take 1 tablet (0.25 mg total) by mouth at bedtime as needed for sleep., Disp: 30 tablet, Rfl: 0   amLODipine (NORVASC) 5 MG tablet, Take 1 tablet (5 mg total) by mouth daily., Disp: 30 tablet, Rfl: 11   aspirin 81 MG EC tablet, Take 81 mg by mouth daily., Disp: , Rfl:    atorvastatin (LIPITOR) 80 MG tablet, Take 1 tablet (80 mg total) by mouth daily., Disp: 90 tablet, Rfl: 3   Cholecalciferol 250 MCG (10000 UT) TABS, Take 1,000 Units by mouth daily., Disp: , Rfl:    cyanocobalamin 1000 MCG tablet, Take 1,000 mcg by mouth daily., Disp: , Rfl:    fluticasone (FLONASE) 50 MCG/ACT nasal spray, Place 2 sprays into both nostrils daily. , Disp: , Rfl:    losartan (COZAAR) 50 MG tablet, Take 1 tablet (50 mg total) by mouth daily., Disp: 90 tablet, Rfl: 3   melatonin 3 MG TABS tablet, Take 10 mg by mouth at bedtime., Disp: , Rfl:    metoprolol tartrate (LOPRESSOR) 50 MG tablet, TAKE 1 TABLET TWICE A DAY, Disp: 180 tablet, Rfl: 3   naproxen sodium (ALEVE) 220 MG tablet, Take 1 tablet (220 mg total) by mouth 2 (two) times daily as needed., Disp: , Rfl:    omeprazole (PRILOSEC OTC) 20 MG tablet, Take 10 mg by mouth daily., Disp: , Rfl:    prednisoLONE acetate (PRED FORTE) 1 % ophthalmic suspension, As directed, Disp: , Rfl:    ranolazine (RANEXA) 1000 MG SR tablet, Take 1 tablet (1,000 mg total) by mouth 2 (two) times daily., Disp: 180 tablet, Rfl: 3    UNABLE TO FIND, 2 (two) times daily. Med Name: Budesonide 0.6 mg/ml + Gentamicin 80 mg/ml nasal rinse, Disp: , Rfl:   Past Medical History: Past Medical History:  Diagnosis Date   Allergy    Angina of effort ([place])    Anxiety    CAD (coronary artery disease), native coronary artery    coronary CTA showing aortic atherosclerosis with very high coronary Ca score at 4630 with severely calcified RCA and LAD > 70% and 50-69% prox to mid LCx.     Carpal tunnel syndrome    HLD (hyperlipidemia)    Hypertension    Lung nodule    Obesity    Pancreatitis 2015   Sleep apnea     Tobacco Use: Social History   Tobacco Use  Smoking Status Never  Smokeless Tobacco Never    Labs: Recent Review Flowsheet Data     Labs for ITP Cardiac and Pulmonary Rehab Latest Ref Rng & Units 09/27/2013 11/07/2020 03/19/2021   Cholestrol 100 - 199 mg/dL 83 104 -   LDLCALC 0 - 99 mg/dL 17 26 -   HDL >39 mg/dL 28(L) 58 -   Trlycerides 0 - 149 mg/dL 188 111 -   Hemoglobin A1c 4.8 - 5.6 % - - 6.6(H)        Exercise Target Goals: Exercise  Program Goal: Individual exercise prescription set using results from initial 6 min walk test and THRR while considering  patient's activity barriers and safety.   Exercise Prescription Goal: Initial exercise prescription builds to 30-45 minutes a day of aerobic activity, 2-3 days per week.  Home exercise guidelines will be given to patient during program as part of exercise prescription that the participant will acknowledge.   Education: Aerobic Exercise: - Group verbal and visual presentation on the components of exercise prescription. Introduces F.I.T.T principle from ACSM for exercise prescriptions.  Reviews F.I.T.T. principles of aerobic exercise including progression. Written material given at graduation. Flowsheet Row Cardiac Rehab from 05/17/2021 in [facility] Cardiac and Pulmonary Rehab  Education need identified 03/09/21       Education: Resistance Exercise: - Group  verbal and visual presentation on the components of exercise prescription. Introduces F.I.T.T principle from ACSM for exercise prescriptions  Reviews F.I.T.T. principles of resistance exercise including progression. Written material given at graduation. Flowsheet Row Cardiac Rehab from 05/17/2021 in [facility] Cardiac and Pulmonary Rehab  Date 04/26/21  Educator [facility]  Instruction Review Code 1- Verbalizes Understanding        Education: Exercise & Equipment Safety: - Individual verbal instruction and demonstration of equipment use and safety with use of the equipment. Flowsheet Row Cardiac Rehab from 05/17/2021 in [facility] Cardiac and Pulmonary Rehab  Education need identified 03/09/21  Date 03/09/21  Educator [place]  Instruction Review Code 1- Verbalizes Understanding       Education: Exercise Physiology & General Exercise Guidelines: - Group verbal and written instruction with models to review the exercise physiology of the cardiovascular system and associated critical values. Provides general exercise guidelines with specific guidelines to those with heart or lung disease.  Flowsheet Row Cardiac Rehab from 05/17/2021 in [facility] Cardiac and Pulmonary Rehab  Date 04/12/21  Educator [facility]  Instruction Review Code 1- Verbalizes Understanding       Education: Flexibility, Balance, Mind/Body Relaxation: - Group verbal and visual presentation with interactive activity on the components of exercise prescription. Introduces F.I.T.T principle from ACSM for exercise prescriptions. Reviews F.I.T.T. principles of flexibility and balance exercise training including progression. Also discusses the mind body connection.  Reviews various relaxation techniques to help reduce and manage stress (i.e. Deep breathing, progressive muscle relaxation, and visualization). Balance handout provided to take home. Written material given at graduation. Flowsheet Row Cardiac Rehab from 05/17/2021 in [facility] Cardiac and Pulmonary Rehab   Date 05/03/21  Educator [place]  Instruction Review Code 1- Verbalizes Understanding       Activity Barriers & Risk Stratification:  Activity Barriers & Cardiac Risk Stratification - 03/09/21 1353       Activity Barriers & Cardiac Risk Stratification   Activity Barriers Left Knee Replacement;Chest Pain/Angina;Deconditioning    Cardiac Risk Stratification Moderate             6 Minute Walk:  6 Minute Walk     Row Name 03/09/21 0934         6 Minute Walk   Phase Initial     Distance 1200 feet     Walk Time 6 minutes     # of Rest Breaks 0     MPH 2.27     METS 2.08     RPE 8     Perceived Dyspnea  0     VO2 Peak 7.29     Symptoms No     Resting HR 54 bpm     Resting BP 98/62  Resting Oxygen Saturation  98 %     Exercise Oxygen Saturation  during 6 min walk 97 %     Max Ex. HR 73 bpm     Max Ex. BP 118/60     2 Minute Post BP 106/60              Oxygen Initial Assessment:   Oxygen Re-Evaluation:   Oxygen Discharge (Final Oxygen Re-Evaluation):   Initial Exercise Prescription:  Initial Exercise Prescription - 03/09/21 0900       Date of Initial Exercise RX and Referring Provider   Date 03/09/21    Referring Provider Fransico Him MD      NuStep   Level 2    SPM 80    Minutes 15    METs 2.08      REL-XR   Level 1    Speed 50    Minutes 15    METs 2      Track   Laps 25   as tolerated   Minutes 15    METs 2.36      Prescription Details   Frequency (times per week) 3    Duration Progress to 30 minutes of continuous aerobic without signs/symptoms of physical distress      Intensity   THRR 40-80% of Max Heartrate 91-129    Ratings of Perceived Exertion 11-13    Perceived Dyspnea 0-4      Progression   Progression Continue progressive overload as per policy without signs/symptoms or physical distress.      Resistance Training   Training Prescription Yes    Weight 4 lb    Reps 10-15             Perform Capillary Blood  Glucose checks as needed.  Exercise Prescription Changes:   Exercise Prescription Changes     Row Name 03/09/21 0900 03/20/21 1600 04/03/21 0800 04/17/21 1000 05/01/21 0800     Response to Exercise   Blood Pressure (Admit) 98/62 124/72 124/70 106/56 126/72   Blood Pressure (Exercise) 118/60 122/64 152/70 -- --   Blood Pressure (Exit) 106/60 96/56 122/60 110/60 110/60   Heart Rate (Admit) 54 bpm 57 bpm 53 bpm 72 bpm 65 bpm   Heart Rate (Exercise) 73 bpm 80 bpm 92 bpm 82 bpm 76 bpm   Heart Rate (Exit) 55 bpm 64 bpm 68 bpm 64 bpm 64 bpm   Oxygen Saturation (Admit) 98 % -- -- -- --   Oxygen Saturation (Exercise) 97 % -- -- -- --   Oxygen Saturation (Exit) 97 % -- -- -- --   Rating of Perceived Exertion (Exercise) 8 13 15 13 13    Perceived Dyspnea (Exercise) 0 -- -- -- --   Symptoms none none none none none   Comments walk test results -- -- -- --   Duration -- Continue with 30 min of aerobic exercise without signs/symptoms of physical distress. Continue with 30 min of aerobic exercise without signs/symptoms of physical distress. Continue with 30 min of aerobic exercise without signs/symptoms of physical distress. Continue with 30 min of aerobic exercise without signs/symptoms of physical distress.   Intensity -- THRR unchanged THRR unchanged THRR unchanged THRR unchanged     Progression   Progression -- Continue to progress workloads to maintain intensity without signs/symptoms of physical distress. Continue to progress workloads to maintain intensity without signs/symptoms of physical distress. Continue to progress workloads to maintain intensity without signs/symptoms of physical distress. Continue to progress workloads to maintain  intensity without signs/symptoms of physical distress.   Average METs -- 3.34 2.37 2.96 3.5     Resistance Training   Training Prescription -- Yes Yes Yes Yes   Weight -- 4 lb 6 lb 6 lb 6 lb   Reps -- 10-15 10-15 10-15 10-15     Interval Training    Interval Training -- No No No No     Recumbant Bike   Level -- -- -- 6 --   Minutes -- -- -- 15 --   METs -- -- -- 2.89 --     NuStep   Level -- 4 -- 5 --   Minutes -- 15 -- 15 --   METs -- 3.5 -- 3.7 --     REL-XR   Level -- -- 3 1.5 4   Speed -- -- 50 -- --   Minutes -- -- 15 15 15    METs -- -- 2 2 3.5     Track   Laps -- 40 32 37 46   Minutes -- 15 15 15 15    METs -- 3.18 2.74 3.01 3.5     Oxygen   Maintain Oxygen Saturation -- 88% or higher 88% or higher 88% or higher 88% or higher    Row Name 05/15/21 1200             Response to Exercise   Blood Pressure (Admit) 124/62       Blood Pressure (Exit) 114/66       Heart Rate (Admit) 58 bpm       Heart Rate (Exercise) 77 bpm       Heart Rate (Exit) 60 bpm       Oxygen Saturation (Admit) 98 %       Oxygen Saturation (Exercise) 98 %       Oxygen Saturation (Exit) 98 %       Rating of Perceived Exertion (Exercise) 14       Symptoms none       Duration Continue with 30 min of aerobic exercise without signs/symptoms of physical distress.       Intensity THRR unchanged         Progression   Progression Continue to progress workloads to maintain intensity without signs/symptoms of physical distress.       Average METs 3.45         Resistance Training   Training Prescription Yes       Weight 6 lb       Reps 10-15         Interval Training   Interval Training No         Recumbant Elliptical   Level 2.2       Minutes 15       METs 2.2         REL-XR   Level 5       Minutes 15       METs 4.8         Track   Laps 43       Minutes 15       METs 3.34         Home Exercise Plan   Plans to continue exercise at Home (comment)  bike       Frequency Add 2 additional days to program exercise sessions.       Initial Home Exercises Provided 04/06/21         Oxygen   Maintain Oxygen Saturation 88% or higher  Exercise Comments:   Exercise Comments     Row Name 03/29/21 0802            Exercise Comments Pt received clearance note to return to rehab. Pt able to follow exercise prescription today without complaint.  Will continue to monitor for progression.                Exercise Goals and Review:   Exercise Goals     Row Name 03/09/21 0933             Exercise Goals   Increase Physical Activity Yes       Intervention Provide advice, education, support and counseling about physical activity/exercise needs.;Develop an individualized exercise prescription for aerobic and resistive training based on initial evaluation findings, risk stratification, comorbidities and participant's personal goals.       Expected Outcomes Short Term: Attend rehab on a regular basis to increase amount of physical activity.;Long Term: Add in home exercise to make exercise part of routine and to increase amount of physical activity.;Long Term: Exercising regularly at least 3-5 days a week.       Increase Strength and Stamina Yes       Intervention Provide advice, education, support and counseling about physical activity/exercise needs.;Develop an individualized exercise prescription for aerobic and resistive training based on initial evaluation findings, risk stratification, comorbidities and participant's personal goals.       Expected Outcomes Short Term: Increase workloads from initial exercise prescription for resistance, speed, and METs.;Short Term: Perform resistance training exercises routinely during rehab and add in resistance training at home;Long Term: Improve cardiorespiratory fitness, muscular endurance and strength as measured by increased METs and functional capacity (6MWT)       Able to understand and use rate of perceived exertion (RPE) scale Yes       Intervention Provide education and explanation on how to use RPE scale       Expected Outcomes Short Term: Able to use RPE daily in rehab to express subjective intensity level;Long Term:  Able to use RPE to guide intensity level  when exercising independently       Able to understand and use Dyspnea scale Yes       Intervention Provide education and explanation on how to use Dyspnea scale       Expected Outcomes Short Term: Able to use Dyspnea scale daily in rehab to express subjective sense of shortness of breath during exertion;Long Term: Able to use Dyspnea scale to guide intensity level when exercising independently       Knowledge and understanding of Target Heart Rate Range (THRR) Yes       Intervention Provide education and explanation of THRR including how the numbers were predicted and where they are located for reference       Expected Outcomes Short Term: Able to state/look up THRR;Long Term: Able to use THRR to govern intensity when exercising independently;Short Term: Able to use daily as guideline for intensity in rehab       Able to check pulse independently Yes       Intervention Provide education and demonstration on how to check pulse in carotid and radial arteries.;Review the importance of being able to check your own pulse for safety during independent exercise       Expected Outcomes Short Term: Able to explain why pulse checking is important during independent exercise;Long Term: Able to check pulse independently and accurately       Understanding of Exercise Prescription  Yes       Intervention Provide education, explanation, and written materials on patient's individual exercise prescription       Expected Outcomes Short Term: Able to explain program exercise prescription;Long Term: Able to explain home exercise prescription to exercise independently                Exercise Goals Re-Evaluation :  Exercise Goals Re-Evaluation     Row Name 03/10/21 0754 03/20/21 1614 04/03/21 0844 04/06/21 0809 04/17/21 1039     Exercise Goal Re-Evaluation   Exercise Goals Review Increase Physical Activity;Able to understand and use rate of perceived exertion (RPE) scale;Knowledge and understanding of Target  Heart Rate Range (THRR);Understanding of Exercise Prescription;Increase Strength and Stamina;Able to check pulse independently Increase Physical Activity;Increase Strength and Stamina;Understanding of Exercise Prescription Increase Physical Activity;Increase Strength and Stamina Increase Physical Activity;Increase Strength and Stamina Increase Physical Activity;Increase Strength and Stamina   Comments Reviewed RPE and dyspnea scales, THR and program prescription with pt today.  Pt voiced understanding and was given a copy of goals to take home. Daniel Holden is off to a good start in rehab.  He is on level 5 for the NuStep and doing 40 laps already.  We will continue to monitor his progress. Daniel Holden continues to do well.  He has increased to 6 lb for strength work. Reviewed home exercise with pt today.  Pt plans to walk and bike at home  for exercise.  Reviewed THR, pulse, RPE, sign and symptoms, pulse oximetery and when to call 911 or MD.  Also discussed weather considerations and indoor options.  Pt voiced understanding. Aquila is doing well in rehab.  He tried out the recumbant bike for the first time at level 6 and tolerated it well. He also increased to 37 laps on the track which is the most he has done thus far. We will continue to monitor.   Expected Outcomes Short: Use RPE daily to regulate intensity. Long: Follow program prescription in THR. Short: Clearance to return to rehab regularly Long: Continue to follow program prescription Short: attend consistently Long:  build overall stamina Short: monitor HR when exercising at home Long: exercise independently Short: Continue increasing nu,ber of laps on the track Long: Continue to increase overall MET level    Row Name 05/01/21 0855 05/10/21 0756 05/15/21 1206         Exercise Goal Re-Evaluation   Exercise Goals Review Increase Physical Activity;Increase Strength and Stamina Increase Physical Activity;Increase Strength and Stamina;Understanding of Exercise  Prescription Increase Physical Activity;Increase Strength and Stamina;Understanding of Exercise Prescription     Comments Daniel Holden continues to improve and has reached 45 laps on the track!  He doesnt reach THR range.  He is using 6 lb for strength work.   Staff will monitor progress. Daniel Holden is doing well in rehab.  He uses his bike on Tuesdays and Thursdays for 20 minutes.  He has been able to get his strength and stamina to start to come back.  His knees are his biggest limitation but he is icing some to help. He is trying to work through it. His wife has joined [employer] and got him a Sports coach pass.  He is starting to push more with intervals. Daniel Holden is doing well in rehab.  He is up to 43 laps on track and level 5 on XR.  He should improve on his post 6WMT when it comes up in next two weeks. We will continue to monitor his progress.  Expected Outcomes Short:  continue to attend consistently Long:  continue to build stamina Short: Lengthen time on bike at home Long: COnitnue to build stamina Short: Improve post 6MWT Long: Continue to improve stamina              Discharge Exercise Prescription (Final Exercise Prescription Changes):  Exercise Prescription Changes - 05/15/21 1200       Response to Exercise   Blood Pressure (Admit) 124/62    Blood Pressure (Exit) 114/66    Heart Rate (Admit) 58 bpm    Heart Rate (Exercise) 77 bpm    Heart Rate (Exit) 60 bpm    Oxygen Saturation (Admit) 98 %    Oxygen Saturation (Exercise) 98 %    Oxygen Saturation (Exit) 98 %    Rating of Perceived Exertion (Exercise) 14    Symptoms none    Duration Continue with 30 min of aerobic exercise without signs/symptoms of physical distress.    Intensity THRR unchanged      Progression   Progression Continue to progress workloads to maintain intensity without signs/symptoms of physical distress.    Average METs 3.45      Resistance Training   Training Prescription Yes    Weight 6 lb    Reps 10-15       Interval Training   Interval Training No      Recumbant Elliptical   Level 2.2    Minutes 15    METs 2.2      REL-XR   Level 5    Minutes 15    METs 4.8      Track   Laps 43    Minutes 15    METs 3.34      Home Exercise Plan   Plans to continue exercise at Home (comment)   bike   Frequency Add 2 additional days to program exercise sessions.    Initial Home Exercises Provided 04/06/21      Oxygen   Maintain Oxygen Saturation 88% or higher             Nutrition:  Target Goals: Understanding of nutrition guidelines, daily intake of sodium <1566m, cholesterol <2020m calories 30% from fat and 7% or less from saturated fats, daily to have 5 or more servings of fruits and vegetables.  Education: All About Nutrition: -Group instruction provided by verbal, written material, interactive activities, discussions, models, and posters to present general guidelines for heart healthy nutrition including fat, fiber, MyPlate, the role of sodium in heart healthy nutrition, utilization of the nutrition label, and utilization of this knowledge for meal planning. Follow up email sent as well. Written material given at graduation. [facility]  Education need identified 03/09/21  Date 05/10/21  Educ[place] Code 1- Verbalizes Understanding       Biometrics:  Pre Biometrics - 03/09/21 0924       Pre Biometrics   Height 5' 7.2" (1.707 m)    Weight 198 lb 11.2 oz (90.1 kg)    BMI (Calculated) 30.93    Single Leg Stand 8.1 seconds              Nutrition Therapy Plan and Nutrition Goals:  Nutrition Therapy & Goals - 04/10/21 0934       Nutrition Therapy   Diet Heart healthy, low Na, Diabetes friendly    Protein (specify units) 70g    Fiber 30 grams    Whole Grain  Foods 3 servings    Saturated Fats 12 max. grams    Fruits and Vegetables 8 servings/day    Sodium 1.5 grams       Personal Nutrition Goals   Nutrition Goal ST: try [employer] Killer Bread (whole wheat), use meal brainstorming sheet to plan out some meals ideas for himself following 2-3 or 3-4 CHO servings per meal LT: lower A1C    Comments Daniel Holden has had a need diagnosis of diabetes 03/19/21 A1C 6.6. Daniel Holden has been struggling with knowing how much CHO he should be having at meals and what it looks like in practice. He has a coffee in the am with splenda. This am had oatmeal (rolled oats with skim milk) and a piece of whole wheat toast with peanut butter. He will usually have left over vegetables and beans for lunch. Both his wife and him cook dinner. He alwasy tries to make sure he gets a vegetable in and chooses whole grains. They use some butter to cook, but also olive oil and canola oil. Discussed heart healthy eating, diabetes friendly eating.      Intervention Plan   Intervention Prescribe, educate and counsel regarding individualized specific dietary modifications aiming towards targeted core components such as weight, hypertension, lipid management, diabetes, heart failure and other comorbidities.    Expected Outcomes Short Term Goal: Understand basic principles of dietary content, such as calories, fat, sodium, cholesterol and nutrients.;Short Term Goal: A plan has been developed with personal nutrition goals set during dietitian appointment.;Long Term Goal: Adherence to prescribed nutrition plan.             Nutrition Assessments:  MEDIFICTS Score Key: ?70 Need to make dietary changes  40-70 Heart Healthy Diet ? 40 Therapeutic Level Cholesterol Diet  Flowsheet Row Cardiac Rehab from 03/09/2021 in [facility] Cardiac and Pulmonary Rehab  Picture Your Plate Total Score on Admission 57      Picture Your Plate Scores: <89 Unhealthy dietary pattern with much room for improvement. 41-50 Dietary pattern unlikely to meet recommendations for good health and room for improvement. 51-60 More healthful dietary  pattern, with some room for improvement.  >60 Healthy dietary pattern, although there may be some specific behaviors that could be improved.    Nutrition Goals Re-Evaluation:  Nutrition Goals Re-Evaluation     [place] Name 04/06/21 0753 05/10/21 0804           Goals   Nutrition Goal -- ST: try Dave's Killer Bread (whole wheat), use meal brainstorming sheet to plan out some meals ideas for himself following 2-3 or 3-4 CHO servings per meal LT: lower A1C      Comment Daniel Holden is scheduled for a nutrition consultation 10/17 at [place] continues to work on portion control.  He is still snacking on sweets at night and trying to get more protein. He did try the Dave's bread and he was not a big fan.  He has been able to switch to whole grain.  He is doing well with his fruits and vegetables.  He is still struggling with carbs.      Expected Outcome -- Short: Continue to work on snacking Long: Continue to work on heart healthy.               Nutrition Goals Discharge (Final Nutrition Goals Re-Evaluation):  Nutrition Goals Re-Evaluation - 05/10/21 0804       Goals   Nutrition Goal ST: try [employer] Killer Bread (whole wheat), use meal brainstorming sheet to plan out  some meals ideas for himself following 2-3 or 3-4 CHO servings per meal LT: lower A1C    Comment Daniel Holden continues to work on portion control.  He is still snacking on sweets at night and trying to get more protein. He did try the [employer] bread and he was not a big fan.  He has been able to switch to whole grain.  He is doing well with his fruits and vegetables.  He is still struggling with carbs.    Expected Outcome Short: Continue to work on snacking Long: Continue to work on heart healthy.             Psychosocial: Target Goals: Acknowledge presence or absence of significant depression and/or stress, maximize coping skills, provide positive support system. Participant is able to verbalize types and ability to use techniques and  skills needed for reducing stress and depression.   Education: Stress, Anxiety, and Depression - Group verbal and visual presentation to define topics covered.  Reviews how body is impacted by stress, anxiety, and depression.  Also discusses healthy ways to reduce stress and to treat/manage anxiety and depression.  Written material given at graduation. Flowsheet Row Cardiac Rehab from 05/17/2021 in [facility] Cardiac and Pulmonary Rehab  Date 04/05/21  Educator [facility]  Instruction Review Code 1- Verbalizes Understanding       Education: Sleep Hygiene -Provides group verbal and written instruction about how sleep can affect your health.  Define sleep hygiene, discuss sleep cycles and impact of sleep habits. Review good sleep hygiene tips.    Initial Review & Psychosocial Screening:  Initial Psych Review & Screening - 03/07/21 0848       Initial Review   Current issues with None Identified      Family Dynamics   Good Support System? Yes   wife   churchfamily     Barriers   Psychosocial barriers to participate in program There are no identifiable barriers or psychosocial needs.      Screening Interventions   Interventions Encouraged to exercise;Provide feedback about the scores to participant;To provide support and resources with identified psychosocial needs    Expected Outcomes Short Term goal: Utilizing psychosocial counselor, staff and physician to assist with identification of specific Stressors or current issues interfering with healing process. Setting desired goal for each stressor or current issue identified.;Long Term Goal: Stressors or current issues are controlled or eliminated.;Short Term goal: Identification and review with participant of any Quality of Life or Depression concerns found by scoring the questionnaire.;Long Term goal: The participant improves quality of Life and PHQ9 Scores as seen by post scores and/or verbalization of changes             Quality of Life Scores:    Quality of Life - 03/09/21 0934       Quality of Life   Select Quality of Life      Quality of Life Scores   Health/Function Pre 21.33 %    Socioeconomic Pre 27.86 %    Psych/Spiritual Pre 21.64 %    Family Pre 28.8 %    GLOBAL Pre 23.84 %            Scores of 19 and below usually indicate a poorer quality of life in these areas.  A difference of  2-3 points is a clinically meaningful difference.  A difference of 2-3 points in the total score of the Quality of Life Index has been associated with significant improvement in overall quality of life, self-image, physical  symptoms, and general health in studies assessing change in quality of life.  PHQ-9: Recent Review Flowsheet Data     Depression screen [facility] 2/9 03/09/2021 01/23/2021 07/14/2020   Decreased Interest 0 0 0   Down, Depressed, Hopeless 0 0 0   PHQ - 2 Score 0 0 0   Altered sleeping 0 - -   Tired, decreased energy 1 - -   Change in appetite 1 - -   Feeling bad or failure about yourself  0 - -   Trouble concentrating 0 - -   Moving slowly or fidgety/restless 0 - -   Suicidal thoughts 0 - -   PHQ-9 Score 2 - -   Difficult doing work/chores Not difficult at all - -      Interpretation of Total Score  Total Score Depression Severity:  1-4 = Minimal depression, 5-9 = Mild depression, 10-14 = Moderate depression, 15-19 = Moderately severe depression, 20-27 = Severe depression   Psychosocial Evaluation and Intervention:  Psychosocial Evaluation - 03/07/21 0909       Psychosocial Evaluation & Interventions   Interventions Encouraged to exercise with the program and follow exercise prescription    Comments Daniel Holden has no barriers to attending the program. 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 He stated the symptoms occur when he is swinging his arms.  He lives with his wife, his support person. He also has his church family. He is ready to  get started to see if he can gain better control of his symptoms.    Expected Outcomes STG: Daniel Holden attends all scheduled sessions, he is able to work on symptom control with the advice of the exercise and nursing staff. LZTG Daniel Holden has acheived better control of his symptoms and continues to maintain control.    Continue Psychosocial Services  Follow up required by staff             Psychosocial Re-Evaluation:  Psychosocial Re-Evaluation     [place] Name 04/06/21 0803 05/10/21 0800           Psychosocial Re-Evaluation   Current issues with Current Stress Concerns Current Stress Concerns      Comments Daniel Holden reports no symptoms of stress depression or anxiety.  He has not had trouble with angina since starting HT. Daniel Holden is doing well in rehab.  He is feeling good mentally.  He does worry about his knee limiting his activity.  He got his eyes done for cataracts and can now see without glasses.  He is sleeping pretty good.      Expected Outcomes Short:  continue to exercise to help with stress Long:  maintain positive outlook Short: Continue to work on strengthening knee lOng: COnitnue to stay positive.               Psychosocial Discharge (Final Psychosocial Re-Evaluation):  Psychosocial Re-Evaluation - 05/10/21 0800       Psychosocial Re-Evaluation   Current issues with Current Stress Concerns    Comments Daniel Holden is doing well in rehab.  He is feeling good mentally.  He does worry about his knee limiting his activity.  He got his eyes done for cataracts and can now see without glasses.  He is sleeping pretty good.    Expected Outcomes Short: Continue to work on strengthening knee lOng: COnitnue to stay positive.             Vocational Rehabilitation:  Provide vocational rehab assistance to qualifying candidates.   Vocational Rehab Evaluation & Intervention:  Vocational Rehab - 03/07/21 0857       Initial Vocational Rehab Evaluation & Intervention   Assessment shows need for  Vocational Rehabilitation No      Vocational Rehab Re-Evaulation   Comments retired             Education: Education Goals: Education classes will be provided on a variety of topics geared toward better understanding of heart health and risk factor modification. Participant will state understanding/return demonstration of topics presented as noted by education test scores.  Learning Barriers/Preferences:  Learning Barriers/Preferences - 03/07/21 0856       Learning Barriers/Preferences   Learning Barriers None   wears hearing aids   Learning Preferences None             General Cardiac Education Topics:  AED/CPR: - Group verbal and written instruction with the use of models to demonstrate the basic use of the AED with the basic ABC's of resuscitation.   Anatomy and Cardiac Procedures: - Group verbal and visual presentation and models provide information about basic cardiac anatomy and function. Reviews the testing methods done to diagnose heart disease and the outcomes of the test results. Describes the treatment choices: Medical Management, Angioplasty, or Coronary Bypass Surgery for treating various heart conditions including Myocardial Infarction, Angina, Valve Disease, and Cardiac Arrhythmias.  Written material given at graduation. Flowsheet Row Cardiac Rehab from 05/17/2021 in [facility] Cardiac and Pulmonary Rehab  Date 04/26/21  Educator SB  Instruction Review Code 1- Verbalizes Understanding       Medication Safety: - Group verbal and visual instruction to review commonly prescribed medications for heart and lung disease. Reviews the medication, class of the drug, and side effects. Includes the steps to properly store meds and maintain the prescription regimen.  Written material given at graduation. Flowsheet Row Cardiac Rehab from 05/17/2021 in [facility] Cardiac and Pulmonary Rehab  Date 05/17/21  Educator SB  Instruction Review Code 1- Verbalizes Understanding        Intimacy: - Group verbal instruction through game format to discuss how heart and lung disease can affect sexual intimacy. Written material given at graduation..   Know Your Numbers and Heart Failure: - Group verbal and visual instruction to discuss disease risk factors for cardiac and pulmonary disease and treatment options.  Reviews associated critical values for Overweight/Obesity, Hypertension, Cholesterol, and Diabetes.  Discusses basics of heart failure: signs/symptoms and treatments.  Introduces Heart Failure Zone chart for action plan for heart failure.  Written material given at graduation.   Infection Prevention: - Provides verbal and written material to individual with discussion of infection control including proper hand washing and proper equipment cleaning during exercise session. Flowsheet Row Cardiac Rehab from 05/17/2021 in [facility] Cardiac and Pulmonary Rehab  Education need identified 03/09/21  Date 03/09/21  Educator [place]  Instruction Review Code 1- Verbalizes Understanding       Falls Prevention: - Provides verbal and written material to individual with discussion of falls prevention and safety. Flowsheet Row Cardiac Rehab from 05/17/2021 in [facility] Cardiac and Pulmonary Rehab  Education need identified 03/09/21  Date 03/09/21  Educator [place]  Instruction Review Code 1- Verbalizes Understanding       Other: -Provides group and verbal instruction on various topics (see comments)   Knowledge Questionnaire Score:  Knowledge Questionnaire Score - 03/09/21 0927       Knowledge Questionnaire Score   Pre Score 23/26:  MI, Nutrition, Exercise             Core Components/Risk Factors/Patient Goals at Admission:  Personal Goals and Risk Factors at Admission - 03/09/21 1127       Core Components/Risk Factors/Patient Goals on Admission    Weight Management Yes;Weight Loss    Intervention Weight Management: Develop a combined nutrition and exercise program  designed to reach desired caloric intake, while maintaining appropriate intake of nutrient and fiber, sodium and fats, and appropriate energy expenditure required for the weight goal.;Weight Management: Provide education and appropriate resources to help participant work on and attain dietary goals.;Weight Management/Obesity: Establish reasonable short term and long term weight goals.    Admit Weight 198 lb (89.8 kg)    Goal Weight: Short Term 193 lb (87.5 kg)    Goal Weight: Long Term 180 lb (81.6 kg)    Expected Outcomes Short Term: Continue to assess and modify interventions until short term weight is achieved;Long Term: Adherence to nutrition and physical activity/exercise program aimed toward attainment of established weight goal;Weight Loss: Understanding of general recommendations for a balanced deficit meal plan, which promotes 1-2 lb weight loss per week and includes a negative energy balance of [phone] kcal/d;Understanding recommendations for meals to include 15-35% energy as protein, 25-35% energy from fat, 35-60% energy from carbohydrates, less than 235m of dietary cholesterol, 20-35 gm of total fiber daily;Understanding of distribution of calorie intake throughout the day with the consumption of 4-5 meals/snacks    Hypertension Yes    Intervention Provide education on lifestyle modifcations including regular physical activity/exercise, weight management, moderate sodium restriction and increased consumption of fresh fruit, vegetables, and low fat dairy, alcohol moderation, and smoking cessation.;Monitor prescription use compliance.    Expected Outcomes Short Term: Continued assessment and intervention until BP is < 140/966mHG in hypertensive participants. < 130/80108mG in hypertensive participants with diabetes, heart failure or chronic kidney disease.;Long Term: Maintenance of blood pressure at goal levels.    Lipids Yes    Intervention Provide education and support for participant on  nutrition & aerobic/resistive exercise along with prescribed medications to achieve LDL <8m40mDL >40mg82m Expected Outcomes Short Term: Participant states understanding of desired cholesterol values and is compliant with medications prescribed. Participant is following exercise prescription and nutrition guidelines.;Long Term: Cholesterol controlled with medications as prescribed, with individualized exercise RX and with personalized nutrition plan. Value goals: LDL < 8mg,41m > 40 mg.             Education:Diabetes - Individual verbal and written instruction to review signs/symptoms of diabetes, desired ranges of glucose level fasting, after meals and with exercise. Acknowledge that pre and post exercise glucose checks will be done for 3 sessions at entry of program.   Core Components/Risk Factors/Patient Goals Review:   Goals and Risk Factor Review     Row Name 04/06/21 0801 05/10/21 0806           Core Components/Risk Factors/Patient Goals Review   Personal Goals Review Weight Management/Obesity;Hypertension Weight Management/Obesity;Hypertension      Review Rick hLiliane Channelost around 20 by his home scale in the last months.  His goal is 180-185 - he was 188 at home today.  He has been watching portion sizes and avoiding alcohol to help lose weight.  He does monitor BP at home.  It is 124/66 today at HT RicFlorenceing well in rehab.  His weight continues to trend down.  He is doing better with portions.  His pressures are good overall, but still have some high and low days.  It worries him when it is low and he is trying to make sure that he is staying hydrated.      Expected Outcomes Short: contineu heart healthy dietary changes Long: manage risk factors long term Short: Continue to work on weight loss Long: Continue to monitor risk factors               Core Components/Risk Factors/Patient Goals at Discharge (Final Review):   Goals and Risk Factor Review - 05/10/21 0806        Core Components/Risk Factors/Patient Goals Review   Personal Goals Review Weight Management/Obesity;Hypertension    Review Daniel Holden is doing well in rehab.  His weight continues to trend down.  He is doing better with portions.  His pressures are good overall, but still have some high and low days.  It worries him when it is low and he is trying to make sure that he is staying hydrated.    Expected Outcomes Short: Continue to work on weight loss Long: Continue to monitor risk factors             ITP Comments:  ITP Comments     Row Name 03/07/21 0904 03/09/21 0923 03/10/21 0753 03/20/21 1613 03/29/21 0802   ITP Comments Virtual orientation call completed today. he has an appointment on Date: 03/09/2021  for EP eval and gym Orientation.  Documentation of diagnosis can be found in [facility] Date: 08/25/02022 . Completed 6MWT and gym orientation. Initial ITP created and sent for review to Dr. Emily Filbert, Medical Director. First full day of exercise!  Patient was oriented to gym and equipment including functions, settings, policies, and procedures.  Patient's individual exercise prescription and treatment plan were reviewed.  All starting workloads were established based on the results of the 6 minute walk test done at initial orientation visit.  The plan for exercise progression was also introduced and progression will be customized based on patient's performance and goals. Pt called out today with ED visit and emergent sinus surgery. He was encouraged to get clearance to return to rehab next week. Pt received clearance note to return to rehab. Pt able to follow exercise prescription today without complaint.  Will continue to monitor for progression.    [place] Name 03/29/21 1403 04/10/21 0934 04/26/21 0802 05/24/21 0742     ITP Comments 30 day review completed. ITP sent to Dr. Emily Filbert, Medical Director of Cardiac Rehab. Continue with ITP unless changes are made by physician. Completed initial RD consultation  30 Day review completed. Medical Director ITP review done, changes made as directed, and signed approval by Medical Director. 30 Day review completed. Medical Director ITP review done, changes made as directed, and signed approval by Medical Director.             Comments:

## 2021-05-24 NOTE — Progress Notes (Signed)
Daily Session Note  Patient Details  Name: Daniel Holden MRN: [id number] Date of Birth: 1946-02-13 Referring Provider:   Flowsheet Row Cardiac Rehab from 03/09/2021 in [facility] Cardiac and Pulmonary Rehab  Referring Provider Fransico Him MD       Encounter Date: 05/24/2021  Check In:  Session Check In - 05/24/21 0748       Check-In   Supervising physician immediately available to respond to emergencies See telemetry face sheet for immediately available ER MD    Location [facility]-Cardiac & Pulmonary Rehab    Staff Present Birdie Sons, MPA, RN;Amanda Oletta Darter, BA, ACSM CEP, Exercise Physiologist;Joseph [place], [place], MA, RCEP, CCRP, CCET    Virtual Visit No    Medication changes reported     Yes    Comments now taking 23m losarten instead of lotensin    Fall or balance concerns reported    No    Tobacco Cessation No Change    Warm-up and Cool-down Performed on first and last piece of equipment    Resistance Training Performed Yes    VAD Patient? No    PAD/SET Patient? No      Pain Assessment   Currently in Pain? No/denies                Social History   Tobacco Use  Smoking Status Never  Smokeless Tobacco Never    Goals Met:  Independence with exercise equipment Exercise tolerated well No report of concerns or symptoms today Strength training completed today  Goals Unmet:  Not Applicable  Comments: Pt able to follow exercise prescription today without complaint.  Will continue to monitor for progression.    Dr. MEmily Filbertis Medical Director for [place]  Dr. FOttie Glazieris Medical Director for [facility].

## 2021-05-25 NOTE — Patient Instructions (Signed)
Discharge Patient Instructions  Patient Details  Name: Daniel Holden MRN: [id number] Date of Birth: 08/22/1945 Referring Provider:  Sueanne Margarita, MD   Number of Visits: 55  Reason for Discharge:  Patient reached a stable level of exercise. Patient independent in their exercise. Patient has met program and personal goals.  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HCC)  Initial Exercise Prescription:  Initial Exercise Prescription - 03/09/21 0900       Date of Initial Exercise RX and Referring Provider   Date 03/09/21    Referring Provider Fransico Him MD      NuStep   Level 2    SPM 80    Minutes 15    METs 2.08      REL-XR   Level 1    Speed 50    Minutes 15    METs 2      Track   Laps 25   as tolerated   Minutes 15    METs 2.36      Prescription Details   Frequency (times per week) 3    Duration Progress to 30 minutes of continuous aerobic without signs/symptoms of physical distress      Intensity   THRR 40-80% of Max Heartrate 91-129    Ratings of Perceived Exertion 11-13    Perceived Dyspnea 0-4      Progression   Progression Continue progressive overload as per policy without signs/symptoms or physical distress.      Resistance Training   Training Prescription Yes    Weight 4 lb    Reps 10-15             Discharge Exercise Prescription (Final Exercise Prescription Changes):  Exercise Prescription Changes - 05/15/21 1200       Response to Exercise   Blood Pressure (Admit) 124/62    Blood Pressure (Exit) 114/66    Heart Rate (Admit) 58 bpm    Heart Rate (Exercise) 77 bpm    Heart Rate (Exit) 60 bpm    Oxygen Saturation (Admit) 98 %    Oxygen Saturation (Exercise) 98 %    Oxygen Saturation (Exit) 98 %    Rating of Perceived Exertion (Exercise) 14    Symptoms none    Duration Continue with 30 min of aerobic exercise without  signs/symptoms of physical distress.    Intensity THRR unchanged      Progression   Progression Continue to progress workloads to maintain intensity without signs/symptoms of physical distress.    Average METs 3.45      Resistance Training   Training Prescription Yes    Weight 6 lb    Reps 10-15      Interval Training   Interval Training No      Recumbant Elliptical   Level 2.2    Minutes 15    METs 2.2      REL-XR   Level 5    Minutes 15    METs 4.8      Track   Laps 43    Minutes 15    METs 3.34      Home Exercise Plan   Plans to continue exercise at Home (comment)   bike   Frequency Add 2 additional days to program exercise sessions.    Initial Home Exercises Provided 04/06/21      Oxygen   Maintain Oxygen Saturation 88% or  higher             Functional Capacity:  6 Minute Walk     Row Name 03/09/21 0934 05/24/21 0827       6 Minute Walk   Phase Initial Discharge    Distance 1200 feet 1465 feet    Distance % Change -- 22.1 %    Distance Feet Change -- 265 ft    Walk Time 6 minutes 6 minutes    # of Rest Breaks 0 0    MPH 2.27 2.77    METS 2.08 2.78    RPE 8 11    Perceived Dyspnea  0 1    VO2 Peak 7.29 9.74    Symptoms No Yes (comment)    Comments -- SOB    Resting HR 54 bpm 55 bpm    Resting BP 98/62 124/62    Resting Oxygen Saturation  98 % --    Exercise Oxygen Saturation  during 6 min walk 97 % --    Max Ex. HR 73 bpm 87 bpm    Max Ex. BP 118/60 134/70    2 Minute Post BP 106/60 --               Nutrition & Weight - Outcomes:  Pre Biometrics - 03/09/21 0924       Pre Biometrics   Height 5' 7.2" (1.707 m)    Weight 198 lb 11.2 oz (90.1 kg)    BMI (Calculated) 30.93    Single Leg Stand 8.1 seconds             Post Biometrics - 05/24/21 0828        Post  Biometrics   Height 5' 7.2" (1.707 m)    Weight 197 lb 8 oz (89.6 kg)    BMI (Calculated) 30.74    Single Leg Stand 4.3 seconds             Nutrition:   Nutrition Therapy & Goals - 04/10/21 0934       Nutrition Therapy   Diet Heart healthy, low Na, Diabetes friendly    Protein (specify units) 70g    Fiber 30 grams    Whole Grain Foods 3 servings    Saturated Fats 12 max. grams    Fruits and Vegetables 8 servings/day    Sodium 1.5 grams      Personal Nutrition Goals   Nutrition Goal ST: try [employer] Killer Bread (whole wheat), use meal brainstorming sheet to plan out some meals ideas for himself following 2-3 or 3-4 CHO servings per meal LT: lower A1C    Comments Liliane Channel has had a need diagnosis of diabetes 03/19/21 A1C 6.6. Liliane Channel has been struggling with knowing how much CHO he should be having at meals and what it looks like in practice. He has a coffee in the am with splenda. This am had oatmeal (rolled oats with skim milk) and a piece of whole wheat toast with peanut butter. He will usually have left over vegetables and beans for lunch. Both his wife and him cook dinner. He alwasy tries to make sure he gets a vegetable in and chooses whole grains. They use some butter to cook, but also olive oil and canola oil. Discussed heart healthy eating, diabetes friendly eating.      Intervention Plan   Intervention Prescribe, educate and counsel regarding individualized specific dietary modifications aiming towards targeted core components such as weight, hypertension, lipid management, diabetes, heart failure and other comorbidities.  Expected Outcomes Short Term Goal: Understand basic principles of dietary content, such as calories, fat, sodium, cholesterol and nutrients.;Short Term Goal: A plan has been developed with personal nutrition goals set during dietitian appointment.;Long Term Goal: Adherence to prescribed nutrition plan.               Goals reviewed with patient; copy given to patient.

## 2021-05-26 ENCOUNTER — Encounter: Payer: Medicare Other | Attending: Cardiology | Admitting: *Deleted

## 2021-05-26 ENCOUNTER — Other Ambulatory Visit: Payer: Self-pay

## 2021-05-26 DIAGNOSIS — I208 Other forms of angina pectoris: Secondary | ICD-10-CM | POA: Diagnosis not present

## 2021-05-26 DIAGNOSIS — Z5189 Encounter for other specified aftercare: Secondary | ICD-10-CM | POA: Insufficient documentation

## 2021-05-26 NOTE — Progress Notes (Signed)
Daily Session Note  Patient Details  Name: Daniel Holden MRN: [id number] Date of Birth: Aug 24, 1945 Referring Provider:   Flowsheet Row Cardiac Rehab from 03/09/2021 in [facility]iac [facility] Pul[facility]hab  Referring Provider Fransico Him MD       Encounter Date: 05/26/2021  Check In:  Session Check In - 05/26/21 0837       Check-In   Supervising physician immediately available to respond to emergencies See telemetry face sheet for immediately available ER MD    Location [facility]-Cardiac & Pulmonary Rehab    Staff Present Heath Lark, RN, BSN, CCRP;Jessica L'Anse, MA, RCEP, CCRP, CCET;Joseph Sterling, [place]    Virtual Visit No    Medication changes reported     No    Fall or balance concerns reported    No    Warm-up and Cool-down Performed on first and last piece of equipment    Resistance Training Performed Yes    VAD Patient? No    PAD/SET Patient? No      Pain Assessment   Currently in Pain? No/denies                Social History   Tobacco Use  Smoking Status Never  Smokeless Tobacco Never    Goals Met:  Independence with exercise equipment Exercise tolerated well No report of concerns or symptoms today  Goals Unmet:  Not Applicable  Comments: Pt able to follow exercise prescription today without complaint.  Will continue to monitor for progression.    Dr. Emily Filbert is Medical Director for [place].  Dr. Ottie Glazier is Medical Director for [facility] Pul[facility].

## 2021-05-29 ENCOUNTER — Telehealth: Payer: Self-pay | Admitting: Cardiology

## 2021-05-29 DIAGNOSIS — Z08 Encounter for follow-up examination after completed treatment for malignant neoplasm: Secondary | ICD-10-CM | POA: Diagnosis not present

## 2021-05-29 DIAGNOSIS — D2339 Other benign neoplasm of skin of other parts of face: Secondary | ICD-10-CM | POA: Diagnosis not present

## 2021-05-29 DIAGNOSIS — Z85828 Personal history of other malignant neoplasm of skin: Secondary | ICD-10-CM | POA: Diagnosis not present

## 2021-05-29 DIAGNOSIS — L570 Actinic keratosis: Secondary | ICD-10-CM | POA: Diagnosis not present

## 2021-05-29 DIAGNOSIS — L821 Other seborrheic keratosis: Secondary | ICD-10-CM | POA: Diagnosis not present

## 2021-05-29 DIAGNOSIS — X32XXXA Exposure to sunlight, initial encounter: Secondary | ICD-10-CM | POA: Diagnosis not present

## 2021-05-29 MED ORDER — AMLODIPINE BESYLATE 5 MG PO TABS: 5.0000 mg | ORAL_TABLET | Freq: Every day | ORAL | 3 refills | Status: DC

## 2021-05-29 NOTE — Telephone Encounter (Signed)
*  STAT* If patient is at the pharmacy, call can be transferred to refill team.   1. Which medications need to be refilled? (please list name of each medication and dose if known) amLODipine (NORVASC) 5 MG tablet  2. Which pharmacy/location (including street and city if local pharmacy) is medication to be sent to? [facility] [place], [place] to Registered [facility] Sites  3. Do they need a 30 day or 90 day supply?  90 day supply

## 2021-05-29 NOTE — Telephone Encounter (Signed)
Pt's medication was sent to pt's pharmacy as requested. Confirmation received.  °

## 2021-05-30 DIAGNOSIS — M1711 Unilateral primary osteoarthritis, right knee: Secondary | ICD-10-CM | POA: Diagnosis not present

## 2021-05-30 DIAGNOSIS — Z96652 Presence of left artificial knee joint: Secondary | ICD-10-CM | POA: Diagnosis not present

## 2021-05-30 DIAGNOSIS — M25519 Pain in unspecified shoulder: Secondary | ICD-10-CM | POA: Diagnosis not present

## 2021-05-31 ENCOUNTER — Other Ambulatory Visit: Payer: Self-pay

## 2021-05-31 DIAGNOSIS — I208 Other forms of angina pectoris: Secondary | ICD-10-CM

## 2021-05-31 DIAGNOSIS — Z5189 Encounter for other specified aftercare: Secondary | ICD-10-CM | POA: Diagnosis not present

## 2021-05-31 NOTE — Progress Notes (Signed)
Daily Session Note  Patient Details  Name: Daniel Holden MRN: [id number] Date of Birth: 1945/12/03 Referring Provider:   Flowsheet Row Cardiac Rehab from 03/09/2021 in [facility] Card[facility] and Pul[facility] Rehab  Referring Provider Fransico Him MD       Encounter Date: 05/31/2021  Check In:  Session Check In - 05/31/21 0742       Check-In   Supervising physician immediately available to respond to emergencies See telemetry face sheet for immediately available ER MD    Location [facility]-Cardiac & Pulmonary Rehab    Staff Present Birdie Sons, MPA, RN;Melissa Ridgway, RDN, Rowe Pavy, BA, ACSM CEP, Exercise Physiologist    Virtual Visit No    Medication changes reported     No    Fall or balance concerns reported    No    Tobacco Cessation No Change    Warm-up and Cool-down Performed on first and last piece of equipment    Resistance Training Performed Yes    VAD Patient? No    PAD/SET Patient? No      Pain Assessment   Currently in Pain? No/denies                Social History   Tobacco Use  Smoking Status Never  Smokeless Tobacco Never    Goals Met:  Independence with exercise equipment Exercise tolerated well No report of concerns or symptoms today Strength training completed today  Goals Unmet:  Not Applicable  Comments:  Daniel Holden graduated today from  rehab with 35 sessions completed.  Details of the patient's exercise prescription and what He needs to do in order to continue the prescription and progress were discussed with patient.  Patient was given a copy of prescription and goals.  Patient verbalized understanding.  Daniel Holden plans to continue to exercise by riding his exercise bike at home.     Dr. Emily Filbert is Medical Director for [place].  Dr. Ottie Glazier is Medical Director for [facility] Pulmonary Rehabilitation.

## 2021-05-31 NOTE — Progress Notes (Signed)
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[place])  Patient's Home Medications on Admission:  Current Outpatient Medications:    ALPRAZolam (XANAX) 0.25 MG tablet, Take 1 tablet (0.25 mg total) by mouth at bedtime as needed for sleep., Disp: 30 tablet, Rfl: 0   amLODipine (NORVASC) 5 MG tablet, Take 1 tablet (5 mg total) by mouth daily., Disp: 90 tablet, Rfl: 3   aspirin 81 MG EC tablet, Take 81 mg by mouth daily., Disp: , Rfl:    atorvastatin (LIPITOR) 80 MG tablet, Take 1 tablet (80 mg total) by mouth daily., Disp: 90 tablet, Rfl: 3   Cholecalciferol 250 MCG (10000 UT) TABS, Take 1,000 Units by mouth daily., Disp: , Rfl:    cyanocobalamin 1000 MCG tablet, Take 1,000 mcg by mouth daily., Disp: , Rfl:    fluticasone (FLONASE) 50 MCG/ACT nasal spray, Place 2 sprays into both nostrils daily. , Disp: , Rfl:    losartan (COZAAR) 50 MG tablet, Take 1 tablet (50 mg total) by mouth daily., Disp: 90 tablet, Rfl: 3   melatonin 3 MG TABS tablet, Take 10 mg by mouth at bedtime., Disp: , Rfl:    metoprolol tartrate (LOPRESSOR) 50 MG tablet, TAKE 1 TABLET TWICE A DAY, Disp: 180 tablet, Rfl: 3   naproxen sodium (ALEVE) 220 MG tablet, Take 1 tablet (220 mg total) by mouth 2 (two) times daily as needed., Disp: , Rfl:    omeprazole (PRILOSEC OTC) 20 MG tablet, Take 10 mg by mouth daily., Disp: , Rfl:    prednisoLONE acetate (PRED FORTE) 1 % ophthalmic suspension, As directed, Disp: , Rfl:    ranolazine (RANEXA) 1000 MG SR tablet, Take 1 tablet (1,000 mg total) by mouth 2 (two) times daily., Disp: 180 tablet, Rfl: 3   UNABLE  TO FIND, 2 (two) times daily. Med Name: Budesonide 0.6 mg/ml + Gentamicin 80 mg/ml nasal rinse, Disp: , Rfl:   Past Medical History: Past Medical History:  Diagnosis Date   Allergy    Angina of effort ([place])    Anxiety    CAD (coronary artery disease), native coronary artery    coronary CTA showing aortic atherosclerosis with very high coronary Ca score at 4630 with severely calcified RCA and LAD > 70% and 50-69% prox to mid LCx.     Carpal tunnel syndrome    HLD (hyperlipidemia)    Hypertension    Lung nodule    Obesity    Pancreatitis 2015   Sleep apnea     Tobacco Use: Social History   Tobacco Use  Smoking Status Never  Smokeless Tobacco Never    Labs: Recent Review Flowsheet Data     Labs for ITP Cardiac and Pulmonary Rehab Latest Ref Rng & Units 09/27/2013 11/07/2020 03/19/2021   Cholestrol 100 - 199 mg/dL 83 104 -   LDLCALC 0 - 99 mg/dL 17 26 -   HDL >39 mg/dL 28(L) 58 -   Trlycerides 0 - 149 mg/dL 188 111 -   Hemoglobin A1c 4.8 - 5.6 % - - 6.6(H)        Exercise Target Goals: Exercise  Program Goal: Individual exercise prescription set using results from initial 6 min walk test and THRR while considering  patient's activity barriers and safety.   Exercise Prescription Goal: Initial exercise prescription builds to 30-45 minutes a day of aerobic activity, 2-3 days per week.  Home exercise guidelines will be given to patient during program as part of exercise prescription that the participant will acknowledge.   Education: Aerobic Exercise: - Group verbal and visual presentation on the components of exercise prescription. Introduces F.I.T.T principle from ACSM for exercise prescriptions.  Reviews F.I.T.T. principles of aerobic exercise including progression. Written material given at graduation. Flowsheet Row Cardiac Rehab from 05/24/2021 in [facility] Cardiac and Pulmonary Rehab  Education need identified 03/09/21       Education: Resistance Exercise: - Group verbal  and visual presentation on the components of exercise prescription. Introduces F.I.T.T principle from ACSM for exercise prescriptions  Reviews F.I.T.T. principles of resistance exercise including progression. Written material given at graduation. Flowsheet Row Cardiac Rehab from 05/24/2021 in [facility] Cardiac and Pulmonary Rehab  Date 04/26/21  Educator [facility]  Instruction Review Code 1- Verbalizes Understanding        Education: Exercise & Equipment Safety: - Individual verbal instruction and demonstration of equipment use and safety with use of the equipment. Flowsheet Row Cardiac Rehab from 05/24/2021 in [facility] Cardiac and Pulmonary Rehab  Education need identified 03/09/21  Date 03/09/21  Educator [place]  Instruction Review Code 1- Verbalizes Understanding       Education: Exercise Physiology & General Exercise Guidelines: - Group verbal and written instruction with models to review the exercise physiology of the cardiovascular system and associated critical values. Provides general exercise guidelines with specific guidelines to those with heart or lung disease.  Flowsheet Row Cardiac Rehab from 05/24/2021 in [facility] Cardiac and Pulmonary Rehab  Date 04/12/21  Educator [facility]  Instruction Review Code 1- Verbalizes Understanding       Education: Flexibility, Balance, Mind/Body Relaxation: - Group verbal and visual presentation with interactive activity on the components of exercise prescription. Introduces F.I.T.T principle from ACSM for exercise prescriptions. Reviews F.I.T.T. principles of flexibility and balance exercise training including progression. Also discusses the mind body connection.  Reviews various relaxation techniques to help reduce and manage stress (i.e. Deep breathing, progressive muscle relaxation, and visualization). Balance handout provided to take home. Written material given at graduation. Flowsheet Row Cardiac Rehab from 05/24/2021 in [facility] Cardiac and Pulmonary Rehab  Date  05/03/21  Educator [place]  Instruction Review Code 1- Verbalizes Understanding       Activity Barriers & Risk Stratification:  Activity Barriers & Cardiac Risk Stratification - 03/09/21 1353       Activity Barriers & Cardiac Risk Stratification   Activity Barriers Left Knee Replacement;Chest Pain/Angina;Deconditioning    Cardiac Risk Stratification Moderate             6 Minute Walk:  6 Minute Walk     Row Name 03/09/21 0934 05/24/21 0827       6 Minute Walk   Phase Initial Discharge    Distance 1200 feet 1465 feet    Distance % Change -- 22.1 %    Distance Feet Change -- 265 ft    Walk Time 6 minutes 6 minutes    # of Rest Breaks 0 0    MPH 2.27 2.77    METS 2.08 2.78    RPE 8 11    Perceived Dyspnea  0 1    VO2 Peak 7.29 9.74  Symptoms No Yes (comment)    Comments -- SOB    Resting HR 54 bpm 55 bpm    Resting BP 98/62 124/62    Resting Oxygen Saturation  98 % --    Exercise Oxygen Saturation  during 6 min walk 97 % --    Max Ex. HR 73 bpm 87 bpm    Max Ex. BP 118/60 134/70    2 Minute Post BP 106/60 --             Oxygen Initial Assessment:   Oxygen Re-Evaluation:   Oxygen Discharge (Final Oxygen Re-Evaluation):   Initial Exercise Prescription:  Initial Exercise Prescription - 03/09/21 0900       Date of Initial Exercise RX and Referring Provider   Date 03/09/21    Referring Provider Fransico Him MD      NuStep   Level 2    SPM 80    Minutes 15    METs 2.08      REL-XR   Level 1    Speed 50    Minutes 15    METs 2      Track   Laps 25   as tolerated   Minutes 15    METs 2.36      Prescription Details   Frequency (times per week) 3    Duration Progress to 30 minutes of continuous aerobic without signs/symptoms of physical distress      Intensity   THRR 40-80% of Max Heartrate 91-129    Ratings of Perceived Exertion 11-13    Perceived Dyspnea 0-4      Progression   Progression Continue progressive overload as per policy  without signs/symptoms or physical distress.      Resistance Training   Training Prescription Yes    Weight 4 lb    Reps 10-15             Perform Capillary Blood Glucose checks as needed.  Exercise Prescription Changes:   Exercise Prescription Changes     Row Name 03/09/21 0900 03/20/21 1600 04/03/21 0800 04/17/21 1000 05/01/21 0800     Response to Exercise   Blood Pressure (Admit) 98/62 124/72 124/70 106/56 126/72   Blood Pressure (Exercise) 118/60 122/64 152/70 -- --   Blood Pressure (Exit) 106/60 96/56 122/60 110/60 110/60   Heart Rate (Admit) 54 bpm 57 bpm 53 bpm 72 bpm 65 bpm   Heart Rate (Exercise) 73 bpm 80 bpm 92 bpm 82 bpm 76 bpm   Heart Rate (Exit) 55 bpm 64 bpm 68 bpm 64 bpm 64 bpm   Oxygen Saturation (Admit) 98 % -- -- -- --   Oxygen Saturation (Exercise) 97 % -- -- -- --   Oxygen Saturation (Exit) 97 % -- -- -- --   Rating of Perceived Exertion (Exercise) 8 13 15 13 13    Perceived Dyspnea (Exercise) 0 -- -- -- --   Symptoms none none none none none   Comments walk test results -- -- -- --   Duration -- Continue with 30 min of aerobic exercise without signs/symptoms of physical distress. Continue with 30 min of aerobic exercise without signs/symptoms of physical distress. Continue with 30 min of aerobic exercise without signs/symptoms of physical distress. Continue with 30 min of aerobic exercise without signs/symptoms of physical distress.   Intensity -- THRR unchanged THRR unchanged THRR unchanged THRR unchanged     Progression   Progression -- Continue to progress workloads to maintain intensity without signs/symptoms of physical distress.  Continue to progress workloads to maintain intensity without signs/symptoms of physical distress. Continue to progress workloads to maintain intensity without signs/symptoms of physical distress. Continue to progress workloads to maintain intensity without signs/symptoms of physical distress.   Average METs -- 3.34 2.37 2.96  3.5     Resistance Training   Training Prescription -- Yes Yes Yes Yes   Weight -- 4 lb 6 lb 6 lb 6 lb   Reps -- 10-15 10-15 10-15 10-15     Interval Training   Interval Training -- No No No No     Recumbant Bike   Level -- -- -- 6 --   Minutes -- -- -- 15 --   METs -- -- -- 2.89 --     NuStep   Level -- 4 -- 5 --   Minutes -- 15 -- 15 --   METs -- 3.5 -- 3.7 --     REL-XR   Level -- -- 3 1.5 4   Speed -- -- 50 -- --   Minutes -- -- 15 15 15    METs -- -- 2 2 3.5     Track   Laps -- 40 32 37 46   Minutes -- 15 15 15 15    METs -- 3.18 2.74 3.01 3.5     Oxygen   Maintain Oxygen Saturation -- 88% or higher 88% or higher 88% or higher 88% or higher    Row Name 05/15/21 1200 05/29/21 0700           Response to Exercise   Blood Pressure (Admit) 124/62 124/62      Blood Pressure (Exercise) -- 134/70      Blood Pressure (Exit) 114/66 106/58      Heart Rate (Admit) 58 bpm 55 bpm      Heart Rate (Exercise) 77 bpm 87 bpm      Heart Rate (Exit) 60 bpm 65 bpm      Oxygen Saturation (Admit) 98 % 97 %      Oxygen Saturation (Exercise) 98 % 98 %      Oxygen Saturation (Exit) 98 % 96 %      Rating of Perceived Exertion (Exercise) 14 14      Symptoms none none      Duration Continue with 30 min of aerobic exercise without signs/symptoms of physical distress. Continue with 30 min of aerobic exercise without signs/symptoms of physical distress.      Intensity THRR unchanged THRR unchanged        Progression   Progression Continue to progress workloads to maintain intensity without signs/symptoms of physical distress. Continue to progress workloads to maintain intensity without signs/symptoms of physical distress.      Average METs 3.45 4.2        Resistance Training   Training Prescription Yes Yes      Weight 6 lb 6 lb      Reps 10-15 10-15        Interval Training   Interval Training No No        Recumbant Bike   Level -- 2.7      Minutes -- 15      METs -- 3.8         NuStep   Level -- 5      Minutes -- 15      METs -- 4.6        Recumbant Elliptical   Level 2.2 --      Minutes 15 --  METs 2.2 --        REL-XR   Level 5 --      Minutes 15 --      METs 4.8 --        Track   Laps 43 --      Minutes 15 --      METs 3.34 --        Home Exercise Plan   Plans to continue exercise at Home (comment)  bike --      Frequency Add 2 additional days to program exercise sessions. --      Initial Home Exercises Provided 04/06/21 --        Oxygen   Maintain Oxygen Saturation 88% or higher --               Exercise Comments:   Exercise Comments     Row Name 03/29/21 0802 05/31/21 0744         Exercise Comments Pt received clearance note to return to rehab. Pt able to follow exercise prescription today without complaint.  Will continue to monitor for progression. Bane graduated today from  rehab with 35 sessions completed.  Details of the patient's exercise prescription and what He needs to do in order to continue the prescription and progress were discussed with patient.  Patient was given a copy of prescription and goals.  Patient verbalized understanding.  Man plans to continue to exercise by riding his exercise bike at home.               Exercise Goals and Review:   Exercise Goals     Row Name 03/09/21 0933             Exercise Goals   Increase Physical Activity Yes       Intervention Provide advice, education, support and counseling about physical activity/exercise needs.;Develop an individualized exercise prescription for aerobic and resistive training based on initial evaluation findings, risk stratification, comorbidities and participant's personal goals.       Expected Outcomes Short Term: Attend rehab on a regular basis to increase amount of physical activity.;Long Term: Add in home exercise to make exercise part of routine and to increase amount of physical activity.;Long Term: Exercising regularly at least  3-5 days a week.       Increase Strength and Stamina Yes       Intervention Provide advice, education, support and counseling about physical activity/exercise needs.;Develop an individualized exercise prescription for aerobic and resistive training based on initial evaluation findings, risk stratification, comorbidities and participant's personal goals.       Expected Outcomes Short Term: Increase workloads from initial exercise prescription for resistance, speed, and METs.;Short Term: Perform resistance training exercises routinely during rehab and add in resistance training at home;Long Term: Improve cardiorespiratory fitness, muscular endurance and strength as measured by increased METs and functional capacity (6MWT)       Able to understand and use rate of perceived exertion (RPE) scale Yes       Intervention Provide education and explanation on how to use RPE scale       Expected Outcomes Short Term: Able to use RPE daily in rehab to express subjective intensity level;Long Term:  Able to use RPE to guide intensity level when exercising independently       Able to understand and use Dyspnea scale Yes       Intervention Provide education and explanation on how to use Dyspnea scale  Expected Outcomes Short Term: Able to use Dyspnea scale daily in rehab to express subjective sense of shortness of breath during exertion;Long Term: Able to use Dyspnea scale to guide intensity level when exercising independently       Knowledge and understanding of Target Heart Rate Range (THRR) Yes       Intervention Provide education and explanation of THRR including how the numbers were predicted and where they are located for reference       Expected Outcomes Short Term: Able to state/look up THRR;Long Term: Able to use THRR to govern intensity when exercising independently;Short Term: Able to use daily as guideline for intensity in rehab       Able to check pulse independently Yes       Intervention Provide  education and demonstration on how to check pulse in carotid and radial arteries.;Review the importance of being able to check your own pulse for safety during independent exercise       Expected Outcomes Short Term: Able to explain why pulse checking is important during independent exercise;Long Term: Able to check pulse independently and accurately       Understanding of Exercise Prescription Yes       Intervention Provide education, explanation, and written materials on patient's individual exercise prescription       Expected Outcomes Short Term: Able to explain program exercise prescription;Long Term: Able to explain home exercise prescription to exercise independently                Exercise Goals Re-Evaluation :  Exercise Goals Re-Evaluation     Row Name 03/10/21 0754 03/20/21 1614 04/03/21 0844 04/06/21 0809 04/17/21 1039     Exercise Goal Re-Evaluation   Exercise Goals Review Increase Physical Activity;Able to understand and use rate of perceived exertion (RPE) scale;Knowledge and understanding of Target Heart Rate Range (THRR);Understanding of Exercise Prescription;Increase Strength and Stamina;Able to check pulse independently Increase Physical Activity;Increase Strength and Stamina;Understanding of Exercise Prescription Increase Physical Activity;Increase Strength and Stamina Increase Physical Activity;Increase Strength and Stamina Increase Physical Activity;Increase Strength and Stamina   Comments Reviewed RPE and dyspnea scales, THR and program prescription with pt today.  Pt voiced understanding and was given a copy of goals to take home. Daniel Holden is off to a good start in rehab.  He is on level 5 for the NuStep and doing 40 laps already.  We will continue to monitor his progress. Daniel Holden continues to do well.  He has increased to 6 lb for strength work. Reviewed home exercise with pt today.  Pt plans to walk and bike at home  for exercise.  Reviewed THR, pulse, RPE, sign and symptoms,  pulse oximetery and when to call 911 or MD.  Also discussed weather considerations and indoor options.  Pt voiced understanding. Griffey is doing well in rehab.  He tried out the recumbant bike for the first time at level 6 and tolerated it well. He also increased to 37 laps on the track which is the most he has done thus far. We will continue to monitor.   Expected Outcomes Short: Use RPE daily to regulate intensity. Long: Follow program prescription in THR. Short: Clearance to return to rehab regularly Long: Continue to follow program prescription Short: attend consistently Long:  build overall stamina Short: monitor HR when exercising at home Long: exercise independently Short: Continue increasing nu,ber of laps on the track Long: Continue to increase overall MET level    Row Name 05/01/21 0855 05/10/21 0756 05/15/21  1206 05/29/21 0753 05/29/21 0754     Exercise Goal Re-Evaluation   Exercise Goals Review Increase Physical Activity;Increase Strength and Stamina Increase Physical Activity;Increase Strength and Stamina;Understanding of Exercise Prescription Increase Physical Activity;Increase Strength and Stamina;Understanding of Exercise Prescription Increase Physical Activity;Increase Strength and Stamina --   Comments Daniel Holden continues to improve and has reached 45 laps on the track!  He doesnt reach THR range.  He is using 6 lb for strength work.   Staff will monitor progress. Daniel Holden is doing well in rehab.  He uses his bike on Tuesdays and Thursdays for 20 minutes.  He has been able to get his strength and stamina to start to come back.  His knees are his biggest limitation but he is icing some to help. He is trying to work through it. His wife has joined [employer] and got him a Sports coach pass.  He is starting to push more with intervals. Daniel Holden is doing well in rehab.  He is up to 43 laps on track and level 5 on XR.  He should improve on his post 6WMT when it comes up in next two weeks. We will continue to  monitor his progress. -- Rick did improve post walk by 22%!   Expected Outcomes Short:  continue to attend consistently Long:  continue to build stamina Short: Lengthen time on bike at home Long: COnitnue to build stamina Short: Improve post 6MWT Long: Continue to improve stamina -- --    Row Name 05/29/21 0758             Exercise Goal Re-Evaluation   Comments He plans to exercise at home after he graduates.       Expected Outcomes Short:  complete Heart Track program Long:maintain exercise independently                Discharge Exercise Prescription (Final Exercise Prescription Changes):  Exercise Prescription Changes - 05/29/21 0700       Response to Exercise   Blood Pressure (Admit) 124/62    Blood Pressure (Exercise) 134/70    Blood Pressure (Exit) 106/58    Heart Rate (Admit) 55 bpm    Heart Rate (Exercise) 87 bpm    Heart Rate (Exit) 65 bpm    Oxygen Saturation (Admit) 97 %    Oxygen Saturation (Exercise) 98 %    Oxygen Saturation (Exit) 96 %    Rating of Perceived Exertion (Exercise) 14    Symptoms none    Duration Continue with 30 min of aerobic exercise without signs/symptoms of physical distress.    Intensity THRR unchanged      Progression   Progression Continue to progress workloads to maintain intensity without signs/symptoms of physical distress.    Average METs 4.2      Resistance Training   Training Prescription Yes    Weight 6 lb    Reps 10-15      Interval Training   Interval Training No      Recumbant Bike   Level 2.7    Minutes 15    METs 3.8      NuStep   Level 5    Minutes 15    METs 4.6             Nutrition:  Target Goals: Understanding of nutrition guidelines, daily intake of sodium <153m, cholesterol <2065m calories 30% from fat and 7% or less from saturated fats, daily to have 5 or more servings of fruits and vegetables.  Education: All About  Nutrition: -Group instruction provided by verbal, written material,  interactive activities, discussions, models, and posters to present general guidelines for heart healthy nutrition including fat, fiber, MyPlate, the role of sodium in heart healthy nutrition, utilization of the nutrition label, and utilization of this knowledge for meal planning. Follow up email sent as well. Written material given at graduation. Flowsheet Row Cardiac Rehab from 05/24/2021 in [facility] Cardiac and Pulmonary Rehab  Education need identified 03/09/21  Date 05/10/21  Educator [place]  Instruction Review Code 1- Verbalizes Understanding       Biometrics:  Pre Biometrics - 03/09/21 0924       Pre Biometrics   Height 5' 7.2" (1.707 m)    Weight 198 lb 11.2 oz (90.1 kg)    BMI (Calculated) 30.93    Single Leg Stand 8.1 seconds             Post Biometrics - 05/24/21 0828        Post  Biometrics   Height 5' 7.2" (1.707 m)    Weight 197 lb 8 oz (89.6 kg)    BMI (Calculated) 30.74    Single Leg Stand 4.3 seconds             Nutrition Therapy Plan and Nutrition Goals:  Nutrition Therapy & Goals - 04/10/21 0934       Nutrition Therapy   Diet Heart healthy, low Na, Diabetes friendly    Protein (specify units) 70g    Fiber 30 grams    Whole Grain Foods 3 servings    Saturated Fats 12 max. grams    Fruits and Vegetables 8 servings/day    Sodium 1.5 grams      Personal Nutrition Goals   Nutrition Goal ST: try [employer] K[employer] Bread (whole wheat), use meal brainstorming sheet to plan out some meals ideas for himself following 2-3 or 3-4 CHO servings per meal LT: lower A1C    Comments Daniel Holden has had a need diagnosis of diabetes 03/19/21 A1C 6.6. Daniel Holden has been struggling with knowing how much CHO he should be having at meals and what it looks like in practice. He has a coffee in the am with splenda. This am had oatmeal (rolled oats with skim milk) and a piece of whole wheat toast with peanut butter. He will usually have left over vegetables and beans for lunch. Both his wife and  him cook dinner. He alwasy tries to make sure he gets a vegetable in and chooses whole grains. They use some butter to cook, but also olive oil and canola oil. Discussed heart healthy eating, diabetes friendly eating.      Intervention Plan   Intervention Prescribe, educate and counsel regarding individualized specific dietary modifications aiming towards targeted core components such as weight, hypertension, lipid management, diabetes, heart failure and other comorbidities.    Expected Outcomes Short Term Goal: Understand basic principles of dietary content, such as calories, fat, sodium, cholesterol and nutrients.;Short Term Goal: A plan has been developed with personal nutrition goals set during dietitian appointment.;Long Term Goal: Adherence to prescribed nutrition plan.             Nutrition Assessments:  MEDIFICTS Score Key: ?70 Need to make dietary changes  40-70 Heart Healthy Diet ? 40 Therapeutic Level Cholesterol Diet  Flowsheet Row Cardiac Rehab from 05/26/2021 in [facility] Cardiac and Pulmonary Rehab  Picture Your Plate Total Score on Admission 57  Picture Your Plate Total Score on Discharge 60      Picture Your Plate  Scores: <40 Unhealthy dietary pattern with much room for improvement. 41-50 Dietary pattern unlikely to meet recommendations for good health and room for improvement. 51-60 More healthful dietary pattern, with some room for improvement.  >60 Healthy dietary pattern, although there may be some specific behaviors that could be improved.    Nutrition Goals Re-Evaluation:  Nutrition Goals Re-Evaluation     [place] Name 04/06/21 0753 05/10/21 0804           Goals   Nutrition Goal -- ST: try [employer] Killer Bread (whole wheat), use meal brainstorming sheet to plan out some meals ideas for himself following 2-3 or 3-4 CHO servings per meal LT: lower A1C      Comment Daniel Holden is scheduled for a nutrition consultation 10/17 at [facility] continues to work on portion  control.  He is still snacking on sweets at night and trying to get more protein. He did try the Dave's bread and he was not a big fan.  He has been able to switch to whole grain.  He is doing well with his fruits and vegetables.  He is still struggling with carbs.      Expected Outcome -- Short: Continue to work on snacking Long: Continue to work on heart healthy.               Nutrition Goals Discharge (Final Nutrition Goals Re-Evaluation):  Nutrition Goals Re-Evaluation - 05/10/21 0804       Goals   Nutrition Goal ST: try [employer] Killer Bread (whole wheat), use meal brainstorming sheet to plan out some meals ideas for himself following 2-3 or 3-4 CHO servings per meal LT: lower A1C    Comment Daniel Holden continues to work on portion control.  He is still snacking on sweets at night and trying to get more protein. He did try the Dave[employer] bread and he was not a big fan.  He has been able to switch to whole grain.  He is doing well with his fruits and vegetables.  He is still struggling with carbs.    Expected Outcome Short: Continue to work on snacking Long: Continue to work on heart healthy.             Psychosocial: Target Goals: Acknowledge presence or absence of significant depression and/or stress, maximize coping skills, provide positive support system. Participant is able to verbalize types and ability to use techniques and skills needed for reducing stress and depression.   Education: Stress, Anxiety, and Depression - Group verbal and visual presentation to define topics covered.  Reviews how body is impacted by stress, anxiety, and depression.  Also discusses healthy ways to reduce stress and to treat/manage anxiety and depression.  Written material given at graduation. Flowsheet Row Cardiac Rehab from 05/24/2021 in [facility] Cardiac and Pulmonary Rehab  Date 04/05/21  Educator [facility]  Instruction Review Code 1- Verbalizes Understanding       Education: Sleep Hygiene -Provides group  verbal and written instruction about how sleep can affect your health.  Define sleep hygiene, discuss sleep cycles and impact of sleep habits. Review good sleep hygiene tips.    Initial Review & Psychosocial Screening:  Initial Psych Review & Screening - 03/07/21 0848       Initial Review   Current issues with None Identified      Family Dynamics   Good Support System? Yes   wife   church[employer]     Barriers   Psychosocial barriers to participate in program There are no identifiable  barriers or psychosocial needs.      Screening Interventions   Interventions Encouraged to exercise;Provide feedback about the scores to participant;To provide support and resources with identified psychosocial needs    Expected Outcomes Short Term goal: Utilizing psychosocial counselor, staff and physician to assist with identification of specific Stressors or current issues interfering with healing process. Setting desired goal for each stressor or current issue identified.;Long Term Goal: Stressors or current issues are controlled or eliminated.;Short Term goal: Identification and review with participant of any Quality of Life or Depression concerns found by scoring the questionnaire.;Long Term goal: The participant improves quality of Life and PHQ9 Scores as seen by post scores and/or verbalization of changes             Quality of Life Scores:   Quality of Life - 05/26/21 0734       Quality of Life Scores   Health/Function Pre 21.33 %    Health/Function Post 18.1 %    Health/Function % Change -15.14 %    Socioeconomic Pre 27.86 %    Socioeconomic Post 25.29 %    Socioeconomic % Change  -9.22 %    Psych/Spiritual Pre 21.64 %    Psych/Spiritual Post 25.07 %    Psych/Spiritual % Change 15.85 %    Family Pre 28.8 %    Family Post 26.1 %    Family % Change -9.38 %    GLOBAL Pre 23.84 %    GLOBAL Post 22.19 %    GLOBAL % Change -6.92 %            Scores of 19 and below usually indicate a  poorer quality of life in these areas.  A difference of  2-3 points is a clinically meaningful difference.  A difference of 2-3 points in the total score of the Quality of Life Index has been associated with significant improvement in overall quality of life, self-image, physical symptoms, and general health in studies assessing change in quality of life.  PHQ-9: Recent Review Flowsheet Data     Depression screen [facility] 2/9 05/26/2021 03/09/2021 01/23/2021 07/14/2020   Decreased Interest 0 0 0 0   Down, Depressed, Hopeless 0 0 0 0   PHQ - 2 Score 0 0 0 0   Altered sleeping 0 0 - -   Tired, decreased energy 1 1 - -   Change in appetite 1 1 - -   Feeling bad or failure about yourself  0 0 - -   Trouble concentrating 0 0 - -   Moving slowly or fidgety/restless 0 0 - -   Suicidal thoughts 0 0 - -   PHQ-9 Score 2 2 - -   Difficult doing work/chores Not difficult at all Not difficult at all - -      Interpretation of Total Score  Total Score Depression Severity:  1-4 = Minimal depression, 5-9 = Mild depression, 10-14 = Moderate depression, 15-19 = Moderately severe depression, 20-27 = Severe depression   Psychosocial Evaluation and Intervention:  Psychosocial Evaluation - 03/07/21 0909       Psychosocial Evaluation & Interventions   Interventions Encouraged to exercise with the program and follow exercise prescription    Comments Daniel Holden has no barriers to attending the program. 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 He stated the symptoms occur when he is swinging his arms.  He lives with his wife, his support person.  He also has his church family. He is ready to get started to see if he can gain better control of his symptoms.    Expected Outcomes STG: Daniel Holden attends all scheduled sessions, he is able to work on symptom control with the advice of the exercise and nursing staff. LZTG Daniel Holden has acheived better control of  his symptoms and continues to maintain control.    Continue Psychosocial Services  Follow up required by staff             Psychosocial Re-Evaluation:  Psychosocial Re-Evaluation     [place] Name 04/06/21 0803 05/10/21 0800           Psychosocial Re-Evaluation   Current issues with Current Stress Concerns Current Stress Concerns      Comments Daniel Holden reports no symptoms of stress depression or anxiety.  He has not had trouble with angina since starting HT. Daniel Holden is doing well in rehab.  He is feeling good mentally.  He does worry about his knee limiting his activity.  He got his eyes done for cataracts and can now see without glasses.  He is sleeping pretty good.      Expected Outcomes Short:  continue to exercise to help with stress Long:  maintain positive outlook Short: Continue to work on strengthening knee lOng: COnitnue to stay positive.               Psychosocial Discharge (Final Psychosocial Re-Evaluation):  Psychosocial Re-Evaluation - 05/10/21 0800       Psychosocial Re-Evaluation   Current issues with Current Stress Concerns    Comments Daniel Holden is doing well in rehab.  He is feeling good mentally.  He does worry about his knee limiting his activity.  He got his eyes done for cataracts and can now see without glasses.  He is sleeping pretty good.    Expected Outcomes Short: Continue to work on strengthening knee lOng: COnitnue to stay positive.             Vocational Rehabilitation: Provide vocational rehab assistance to qualifying candidates.   Vocational Rehab Evaluation & Intervention:  Vocational Rehab - 05/26/21 0734       Discharge Vocational Rehab   Discharge Vocational Rehabilitation retired             Education: Education Goals: Education classes will be provided on a variety of topics geared toward better understanding of heart health and risk factor modification. Participant will state understanding/return demonstration of topics presented as  noted by education test scores.  Learning Barriers/Preferences:  Learning Barriers/Preferences - 03/07/21 0856       Learning Barriers/Preferences   Learning Barriers None   wears hearing aids   Learning Preferences None             General Cardiac Education Topics:  AED/CPR: - Group verbal and written instruction with the use of models to demonstrate the basic use of the AED with the basic ABC's of resuscitation.   Anatomy and Cardiac Procedures: - Group verbal and visual presentation and models provide information about basic cardiac anatomy and function. Reviews the testing methods done to diagnose heart disease and the outcomes of the test results. Describes the treatment choices: Medical Management, Angioplasty, or Coronary Bypass Surgery for treating various heart conditions including Myocardial Infarction, Angina, Valve Disease, and Cardiac Arrhythmias.  Written material given at graduation. Flowsheet Row Cardiac Rehab from 05/24/2021 in [facility] Cardiac and Pulmonary Rehab  Date 04/26/21  Educator SB  Instruction Review Code 1-  Verbalizes Understanding       Medication Safety: - Group verbal and visual instruction to review commonly prescribed medications for heart and lung disease. Reviews the medication, class of the drug, and side effects. Includes the steps to properly store meds and maintain the prescription regimen.  Written material given at graduation. Flowsheet Row Cardiac Rehab from 05/24/2021 in [facility] Cardiac and Pulmonary Rehab  Date 05/17/21  Educator SB  Instruction Review Code 1- Verbalizes Understanding       Intimacy: - Group verbal instruction through game format to discuss how heart and lung disease can affect sexual intimacy. Written material given at graduation..   Know Your Numbers and Heart Failure: - Group verbal and visual instruction to discuss disease risk factors for cardiac and pulmonary disease and treatment options.  Reviews associated  critical values for Overweight/Obesity, Hypertension, Cholesterol, and Diabetes.  Discusses basics of heart failure: signs/symptoms and treatments.  Introduces Heart Failure Zone chart for action plan for heart failure.  Written material given at graduation. Flowsheet Row Cardiac Rehab from 05/24/2021 in [facility] Cardiac and Pulmonary Rehab  Date 05/24/21  Educator SB  Instruction Review Code 1- Verbalizes Understanding       Infection Prevention: - Provides verbal and written material to individual with discussion of infection control including proper hand washing and proper equipment cleaning during exercise session. Flowsheet Row Cardiac Rehab from 05/24/2021 in [facility] Cardiac and Pulmonary Rehab  Education need identified 03/09/21  Date 03/09/21  Educator [place]  Instruction Review Code 1- Verbalizes Understanding       Falls Prevention: - Provides verbal and written material to individual with discussion of falls prevention and safety. Flowsheet Row Cardiac Rehab from 05/24/2021 in [facility] Cardiac and Pulmonary Rehab  Education need identified 03/09/21  Date 03/09/21  Educator [place]  Instruction Review Code 1- Verbalizes Understanding       Other: -Provides group and verbal instruction on various topics (see comments)   Knowledge Questionnaire Score:  Knowledge Questionnaire Score - 05/26/21 0732       Knowledge Questionnaire Score   Pre Score 23/26: MI, Nutrition, Exercise    Post Score 25/26 reviewed correct response and Rick verbalized understanding             Core Components/Risk Factors/Patient Goals at Admission:  Personal Goals and Risk Factors at Admission - 03/09/21 1127       Core Components/Risk Factors/Patient Goals on Admission    Weight Management Yes;Weight Loss    Intervention Weight Management: Develop a combined nutrition and exercise program designed to reach desired caloric intake, while maintaining appropriate intake of nutrient and fiber, sodium and  fats, and appropriate energy expenditure required for the weight goal.;Weight Management: Provide education and appropriate resources to help participant work on and attain dietary goals.;Weight Management/Obesity: Establish reasonable short term and long term weight goals.    Admit Weight 198 lb (89.8 kg)    Goal Weight: Short Term 193 lb (87.5 kg)    Goal Weight: Long Term 180 lb (81.6 kg)    Expected Outcomes Short Term: Continue to assess and modify interventions until short term weight is achieved;Long Term: Adherence to nutrition and physical activity/exercise program aimed toward attainment of established weight goal;Weight Loss: Understanding of general recommendations for a balanced deficit meal plan, which promotes 1-2 lb weight loss per week and includes a negative energy balance of [phone] kcal/d;Understanding recommendations for meals to include 15-35% energy as protein, 25-35% energy from fat, 35-60% energy from carbohydrates, less than 297m  of dietary cholesterol, 20-35 gm of total fiber daily;Understanding of distribution of calorie intake throughout the day with the consumption of 4-5 meals/snacks    Hypertension Yes    Intervention Provide education on lifestyle modifcations including regular physical activity/exercise, weight management, moderate sodium restriction and increased consumption of fresh fruit, vegetables, and low fat dairy, alcohol moderation, and smoking cessation.;Monitor prescription use compliance.    Expected Outcomes Short Term: Continued assessment and intervention until BP is < 140/59m HG in hypertensive participants. < 130/867mHG in hypertensive participants with diabetes, heart failure or chronic kidney disease.;Long Term: Maintenance of blood pressure at goal levels.    Lipids Yes    Intervention Provide education and support for participant on nutrition & aerobic/resistive exercise along with prescribed medications to achieve LDL <7069mHDL >59m69m  Expected  Outcomes Short Term: Participant states understanding of desired cholesterol values and is compliant with medications prescribed. Participant is following exercise prescription and nutrition guidelines.;Long Term: Cholesterol controlled with medications as prescribed, with individualized exercise RX and with personalized nutrition plan. Value goals: LDL < 70mg77mL > 40 mg.             Education:Diabetes - Individual verbal and written instruction to review signs/symptoms of diabetes, desired ranges of glucose level fasting, after meals and with exercise. Acknowledge that pre and post exercise glucose checks will be done for 3 sessions at entry of program.   Core Components/Risk Factors/Patient Goals Review:   Goals and Risk Factor Review     Row Name 04/06/21 0801 05/10/21 0806           Core Components/Risk Factors/Patient Goals Review   Personal Goals Review Weight Management/Obesity;Hypertension Weight Management/Obesity;Hypertension      Review Rick Daniel Channellost around 20 by his home scale in the last months.  His goal is 180-185 - he was 188 at home today.  He has been watching portion sizes and avoiding alcohol to help lose weight.  He does monitor BP at home.  It is 124/66 today at [facility] [place] in rehab.  His weight continues to trend down.  He is doing better with portions.  His pressures are good overall, but still have some high and low days.  It worries him when it is low and he is trying to make sure that he is staying hydrated.      Expected Outcomes Short: contineu heart healthy dietary changes Long: manage risk factors long term Short: Continue to work on weight loss Long: Continue to monitor risk factors               Core Components/Risk Factors/Patient Goals at Discharge (Final Review):   Goals and Risk Factor Review - 05/10/21 0806       Core Components/Risk Factors/Patient Goals Review   Personal Goals Review Weight Management/Obesity;Hypertension     Review Rick Daniel Channeloing well in rehab.  His weight continues to trend down.  He is doing better with portions.  His pressures are good overall, but still have some high and low days.  It worries him when it is low and he is trying to make sure that he is staying hydrated.    Expected Outcomes Short: Continue to work on weight loss Long: Continue to monitor risk factors             ITP Comments:  ITP Comments     Row Name 03/07/21 0904 03/09/21 0923 03/10/21 0753 03/20/21 1613 03/29/21 0802   ITP  Comments Virtual orientation call completed today. he has an appointment on Date: 03/09/2021  for EP eval and gym Orientation.  Documentation of diagnosis can be found in [employer] Date: 08/25/02022 . Completed 6MWT and gym orientation. Initial ITP created and sent for review to Dr. Emily Filbert, Medical Director. First full day of exercise!  Patient was oriented to gym and equipment including functions, settings, policies, and procedures.  Patient's individual exercise prescription and treatment plan were reviewed.  All starting workloads were established based on the results of the 6 minute walk test done at initial orientation visit.  The plan for exercise progression was also introduced and progression will be customized based on patient's performance and goals. Pt called out today with ED visit and emergent sinus surgery. He was encouraged to get clearance to return to rehab next week. Pt received clearance note to return to rehab. Pt able to follow exercise prescription today without complaint.  Will continue to monitor for progression.    [place] Name 03/29/21 1403 04/10/21 0934 04/26/21 0802 05/24/21 0742 05/31/21 0743   ITP Comments 30 day review completed. ITP sent to Dr. Emily Filbert, Medical Director of Cardiac Rehab. Continue with ITP unless changes are made by physician. Completed initial RD consultation 30 Day review completed. Medical Director ITP review done, changes made as directed, and signed approval by  Medical Director. 30 Day review completed. Medical Director ITP review done, changes made as directed, and signed approval by Medical Director. [place] graduated today from  rehab with 35 sessions completed.  Details of the patient's exercise prescription and what He needs to do in order to continue the prescription and progress were discussed with patient.  Patient was given a copy of prescription and goals.  Patient verbalized understanding.  Ifeanyi plans to continue to exercise by riding his exercise bike at home.            Comments: discharge ITP

## 2021-05-31 NOTE — Progress Notes (Signed)
Discharge Progress Report  Patient Details  Name: Daniel Holden MRN: [id number] Date of Birth: [birth year] Referring Provider:   Flowsheet Row Cardiac Rehab from 03/09/2021 in [facility]  Referring Provider Fransico Him MD        Number of Visits: 14  Reason for Discharge:  Patient reached a stable level of exercise. Patient independent in their exercise. Patient has met program and personal goals.  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[place])  ADL [facility]:   Initial Exercise Prescription:  Initial Exercise Prescription - 03/09/21 0900       Date of Initial Exercise RX and Referring Provider   Date 03/09/21    Referring Provider Fransico Him MD      NuStep   Level 2    SPM 80    Minutes 15    METs 2.08      REL-XR   Level 1    Speed 50    Minutes 15    METs 2      Track   Laps 25   as tolerated   Minutes 15    METs 2.36      Prescription Details   Frequency (times per week) 3    Duration Progress to 30 minutes of continuous aerobic without signs/symptoms of physical distress      Intensity   THRR 40-80% of Max Heartrate 91-129    Ratings of Perceived Exertion 11-13    Perceived Dyspnea 0-4      Progression   Progression Continue progressive overload as per policy without signs/symptoms or physical distress.      Resistance Training   Training Prescription Yes    Weight 4 lb    Reps 10-15             Discharge Exercise Prescription (Final Exercise Prescription Changes):  Exercise Prescription Changes - 05/29/21 0700       Response to Exercise   Blood Pressure (Admit) 124/62    Blood Pressure (Exercise) 134/70    Blood Pressure (Exit) 106/58    Heart Rate (Admit) 55 bpm    Heart Rate (Exercise) 87 bpm    Heart Rate (Exit) 65 bpm    Oxygen Saturation (Admit) 97 %    Oxygen Saturation (Exercise) 98 %    Oxygen  Saturation (Exit) 96 %    Rating of Perceived Exertion (Exercise) 14    Symptoms none    Duration Continue with 30 min of aerobic exercise without signs/symptoms of physical distress.    Intensity THRR unchanged      Progression   Progression Continue to progress workloads to maintain intensity without signs/symptoms of physical distress.    Average METs 4.2      Resistance Training   Training Prescription Yes    Weight 6 lb    Reps 10-15      Interval Training   Interval Training No      Recumbant Bike   Level 2.7    Minutes 15    METs 3.8      NuStep   Level 5    Minutes 15    METs 4.6             Functional Capacity:  6 Minute Walk     Row Name 03/09/21 0934 05/24/21 0827       6 Minute Walk   Phase Initial  Discharge    Distance 1200 feet 1465 feet    Distance % Change -- 22.1 %    Distance Feet Change -- 265 ft    Walk Time 6 minutes 6 minutes    # of Rest Breaks 0 0    MPH 2.27 2.77    METS 2.08 2.78    RPE 8 11    Perceived Dyspnea  0 1    VO2 Peak 7.29 9.74    Symptoms No Yes (comment)    Comments -- SOB    Resting HR 54 bpm 55 bpm    Resting BP 98/62 124/62    Resting Oxygen Saturation  98 % --    Exercise Oxygen Saturation  during 6 min walk 97 % --    Max Ex. HR 73 bpm 87 bpm    Max Ex. BP 118/60 134/70    2 Minute Post BP 106/60 --             Psychological, QOL, Others - Outcomes: PHQ 2/9: Depression screen [facility] 2/9 05/26/2021 03/09/2021 01/23/2021 07/14/2020  Decreased Interest 0 0 0 0  Down, Depressed, Hopeless 0 0 0 0  PHQ - 2 Score 0 0 0 0  Altered sleeping 0 0 - -  Tired, decreased energy 1 1 - -  Change in appetite 1 1 - -  Feeling bad or failure about yourself  0 0 - -  Trouble concentrating 0 0 - -  Moving slowly or fidgety/restless 0 0 - -  Suicidal thoughts 0 0 - -  PHQ-9 Score 2 2 - -  Difficult doing work/chores Not difficult at all Not difficult at all - -    Quality of Life:  Quality of Life - 05/26/21 0734        Quality of Life Scores   Health/Function Pre 21.33 %    Health/Function Post 18.1 %    Health/Function % Change -15.14 %    Socioeconomic Pre 27.86 %    Socioeconomic Post 25.29 %    Socioeconomic % Change  -9.22 %    Psych/Spiritual Pre 21.64 %    Psych/Spiritual Post 25.07 %    Psych/Spiritual % Change 15.85 %    Family Pre 28.8 %    Family Post 26.1 %    Family % Change -9.38 %    GLOBAL Pre 23.84 %    GLOBAL Post 22.19 %    GLOBAL % Change -6.92 %                Nutrition & Weight - Outcomes:  Pre Biometrics - 03/09/21 0924       Pre Biometrics   Height 5' 7.2" (1.707 m)    Weight 198 lb 11.2 oz (90.1 kg)    BMI (Calculated) 30.93    Single Leg Stand 8.1 seconds             Post Biometrics - 05/24/21 0828        Post  Biometrics   Height 5' 7.2" (1.707 m)    Weight 197 lb 8 oz (89.6 kg)    BMI (Calculated) 30.74    Single Leg Stand 4.3 seconds             Nutrition:  Nutrition Therapy & Goals - 04/10/21 0934       Nutrition Therapy   Diet Heart healthy, low Na, Diabetes friendly    Protein (specify units) 70g    Fiber 30 grams    Whole  Grain Foods 3 servings    Saturated Fats 12 max. grams    Fruits and Vegetables 8 servings/day    Sodium 1.5 grams      Personal Nutrition Goals   Nutrition Goal ST: try [employer] Killer Bread (whole wheat), use meal brainstorming sheet to plan out some meals ideas for himself following 2-3 or 3-4 CHO servings per meal LT: lower A1C    Comments Liliane Channel has had a need diagnosis of diabetes 03/19/21 A1C 6.6. Liliane Channel has been struggling with knowing how much CHO he should be having at meals and what it looks like in practice. He has a coffee in the am with splenda. This am had oatmeal (rolled oats with skim milk) and a piece of whole wheat toast with peanut butter. He will usually have left over vegetables and beans for lunch. Both his wife and him cook dinner. He alwasy tries to make sure he gets a vegetable in and  chooses whole grains. They use some butter to cook, but also olive oil and canola oil. Discussed heart healthy eating, diabetes friendly eating.      Intervention Plan   Intervention Prescribe, educate and counsel regarding individualized specific dietary modifications aiming towards targeted core components such as weight, hypertension, lipid management, diabetes, heart failure and other comorbidities.    Expected Outcomes Short Term Goal: Understand basic principles of dietary content, such as calories, fat, sodium, cholesterol and nutrients.;Short Term Goal: A plan has been developed with personal nutrition goals set during dietitian appointment.;Long Term Goal: Adherence to prescribed nutrition plan.             Nutrition Discharge:   Education Questionnaire Score:  Knowledge Questionnaire Score - 05/26/21 0732       Knowledge Questionnaire Score   Pre Score 23/26: MI, Nutrition, Exercise    Post Score 25/26 reviewed correct response and Rick verbalized understanding             Goals reviewed with patient; copy given to patient.

## 2021-06-01 ENCOUNTER — Encounter: Payer: Self-pay | Admitting: Cardiology

## 2021-06-04 ENCOUNTER — Encounter: Payer: Self-pay | Admitting: Internal Medicine

## 2021-06-04 NOTE — Progress Notes (Signed)
Established Patient Office Visit  Subjective:  Patient ID: Daniel Holden, male    DOB: 1946-03-04  Age: 75 y.o. MRN: [id number]  CC:  Chief Complaint  Patient presents with   Medication Refill    Xanax dosage     Medication Refill   Daniel Holden presents for general physical checkup, he has a problem with obesity anxiety he has stopped drinking he was advised to lose weight.  Past Medical History:  Diagnosis Date   Allergy    Angina of effort ([place])    Anxiety    CAD (coronary artery disease), native coronary artery    coronary CTA showing aortic atherosclerosis with very high coronary Ca score at 4630 with severely calcified RCA and LAD > 70% and 50-69% prox to mid LCx.     Carpal tunnel syndrome    HLD (hyperlipidemia)    Hypertension    Lung nodule    Obesity    Pancreatitis 2015   Sleep apnea     Past Surgical History:  Procedure Laterality Date   CARPAL TUNNEL RELEASE     ETHMOIDECTOMY Right 03/20/2021   Procedure: ANTERIOR ETHMOIDECTOMY;  Surgeon: Clyde Canterbury, MD;  Location: [facility];  Service: ENT;  Laterality: Right;   IMAGE GUIDED SINUS SURGERY N/A 03/20/2021   Procedure: IMAGE GUIDED SINUS SURGERY;  Surgeon: Clyde Canterbury, MD;  Location: [facility];  Service: ENT;  Laterality: N/A;   KNEE ARTHROSCOPY WITH MEDIAL MENISECTOMY Left 06/25/2019   Procedure: KNEE ARTHROSCOPY WITH MEDIAL MENISECTOMY;  Surgeon: Thornton Park, MD;  Location: [facility];  Service: Orthopedics;  Laterality: Left;   LEFT HEART CATH AND CORONARY ANGIOGRAPHY N/A 10/04/2020   Procedure: LEFT HEART CATH AND CORONARY ANGIOGRAPHY;  Surgeon: Leonie Man, MD;  Location: [facility];  Service: Cardiovascular;  Laterality: N/A;   MAXILLARY ANTROSTOMY Right 03/20/2021   Procedure: MAXILLARY ANTROSTOMY WITH TISSUE REMOVAL;  Surgeon: Clyde Canterbury, MD;  Location: [facility];  Service: ENT;  Laterality: Right;   SHOULDER SURGERY     TONSILLECTOMY      Family History  Problem  Relation Age of Onset   CVA Father     Social History   Socioeconomic History   Marital status: Married    Spouse name: Not on file   Number of children: Not on file   Years of education: Not on file   Highest education level: Not on file  Occupational History   Not on file  Tobacco Use   Smoking status: Never   Smokeless tobacco: Never  Vaping Use   Vaping Use: Never used  Substance and Sexual Activity   Alcohol use: Yes    Alcohol/week: 2.0 standard drinks    Types: 2 Cans of beer per week    Comment: 6 per day   Drug use: No   Sexual activity: Not on file  Other Topics Concern   Not on file  Social History Narrative   Not on file   Social Determinants of Health   Financial Resource Strain: Not on file  Food Insecurity: Not on file  Transportation Needs: Not on file  Physical Activity: Not on file  Stress: Not on file  Social Connections: Not on file  Intimate Partner Violence: Not on file     Current Outpatient Medications:    ALPRAZolam (XANAX) 0.25 MG tablet, Take 1 tablet (0.25 mg total) by mouth at bedtime as needed for sleep., Disp: 30 tablet, Rfl: 0   amLODipine (NORVASC) 5 MG  tablet, Take 1 tablet (5 mg total) by mouth daily., Disp: 90 tablet, Rfl: 3   aspirin 81 MG EC tablet, Take 81 mg by mouth daily., Disp: , Rfl:    atorvastatin (LIPITOR) 80 MG tablet, Take 1 tablet (80 mg total) by mouth daily., Disp: 90 tablet, Rfl: 3   Cholecalciferol 250 MCG (10000 UT) TABS, Take 1,000 Units by mouth daily., Disp: , Rfl:    cyanocobalamin 1000 MCG tablet, Take 1,000 mcg by mouth daily., Disp: , Rfl:    fluticasone (FLONASE) 50 MCG/ACT nasal spray, Place 2 sprays into both nostrils daily. , Disp: , Rfl:    losartan (COZAAR) 50 MG tablet, Take 1 tablet (50 mg total) by mouth daily., Disp: 90 tablet, Rfl: 3   melatonin 3 MG TABS tablet, Take 10 mg by mouth at bedtime., Disp: , Rfl:    metoprolol tartrate (LOPRESSOR) 50 MG tablet, TAKE 1 TABLET TWICE A DAY, Disp: 180  tablet, Rfl: 3   naproxen sodium (ALEVE) 220 MG tablet, Take 1 tablet (220 mg total) by mouth 2 (two) times daily as needed., Disp: , Rfl:    omeprazole (PRILOSEC OTC) 20 MG tablet, Take 10 mg by mouth daily., Disp: , Rfl:    prednisoLONE acetate (PRED FORTE) 1 % ophthalmic suspension, As directed, Disp: , Rfl:    ranolazine (RANEXA) 1000 MG SR tablet, Take 1 tablet (1,000 mg total) by mouth 2 (two) times daily., Disp: 180 tablet, Rfl: 3   UNABLE TO FIND, 2 (two) times daily. Med Name: Budesonide 0.6 mg/ml + Gentamicin 80 mg/ml nasal rinse, Disp: , Rfl:    Allergies  Allergen Reactions   Metronidazole Other (See Comments)    Patient stated that he had a moderate level of pain with his allergic reaction.    Dilaudid [Hydromorphone Hcl] Nausea And Vomiting    ROS Review of Systems  Constitutional: Negative.   HENT: Negative.    Eyes: Negative.   Respiratory: Negative.    Cardiovascular: Negative.   Gastrointestinal: Negative.   Endocrine: Negative.   Genitourinary: Negative.   Musculoskeletal: Negative.   Skin: Negative.   Allergic/Immunologic: Negative.   Neurological: Negative.   Hematological: Negative.   Psychiatric/Behavioral: Negative.    All other systems reviewed and are negative.    Objective:    Physical Exam Vitals reviewed.  Constitutional:      Appearance: Normal appearance.  HENT:     Mouth/Throat:     Mouth: Mucous membranes are moist.  Eyes:     Pupils: Pupils are equal, round, and reactive to light.  Neck:     Vascular: No carotid bruit.  Cardiovascular:     Rate and Rhythm: Normal rate and regular rhythm.     Pulses: Normal pulses.     Heart sounds: Normal heart sounds.  Pulmonary:     Effort: Pulmonary effort is normal.     Breath sounds: Normal breath sounds.  Abdominal:     General: Bowel sounds are normal.     Palpations: Abdomen is soft. There is no hepatomegaly, splenomegaly or mass.     Tenderness: There is no abdominal tenderness.      Hernia: No hernia is present.  Musculoskeletal:     Cervical back: Neck supple.     Right lower leg: No edema.     Left lower leg: No edema.  Skin:    Findings: No rash.  Neurological:     Mental Status: He is alert and oriented to person, place, and time.  Motor: No weakness.  Psychiatric:        Mood and Affect: Mood normal.        Behavior: Behavior normal.    BP (!) 142/78   Pulse (!) 54   Ht 5' 6"  (1.676 m)   Wt 198 lb 8 oz (90 kg)   BMI 32.04 kg/m  Wt Readings from Last 3 Encounters:  05/24/21 197 lb 8 oz (89.6 kg)  05/02/21 201 lb (91.2 kg)  04/24/21 198 lb 8 oz (90 kg)     Health Maintenance Due  Topic Date Due   Hepatitis C Screening  Never done   COLONOSCOPY (Pts 45-71yr Insurance coverage will need to be confirmed)  Never done   COVID-19 Vaccine (4 - [place]oster [place] 06/27/2020    There are no preventive care reminders to display for this patient.  No results found for: TSH Lab Results  Component Value Date   WBC 9.2 03/22/2021   HGB 11.0 (L) 03/22/2021   HCT 31.4 (L) 03/22/2021   MCV 100.3 (H) 03/22/2021   PLT 267 03/22/2021   Lab Results  Component Value Date   NA 131 (L) 03/21/2021   K 4.3 03/21/2021   CO2 23 03/21/2021   GLUCOSE 186 (H) 03/21/2021   BUN 22 03/21/2021   CREATININE 0.85 03/21/2021   BILITOT 1.4 (H) 03/19/2021   ALKPHOS 66 03/19/2021   AST 14 (L) 03/19/2021   ALT 7 03/19/2021   PROT 7.9 03/19/2021   ALBUMIN 4.2 03/19/2021   CALCIUM 9.2 03/21/2021   ANIONGAP 11 03/21/2021   EGFR 91 01/12/2021   Lab Results  Component Value Date   CHOL 104 11/07/2020   Lab Results  Component Value Date   HDL 58 11/07/2020   Lab Results  Component Value Date   LDLCALC 26 11/07/2020   Lab Results  Component Value Date   TRIG 111 11/07/2020   Lab Results  Component Value Date   CHOLHDL 1.8 11/07/2020   Lab Results  Component Value Date   HGBA1C 6.6 (H) 03/19/2021      Assessment & Plan:   Problem List  Items Addressed This Visit       Cardiovascular and Mediastinum   Primary hypertension     Patient denies any chest pain or shortness of breath there is no history of palpitation or paroxysmal nocturnal dyspnea   patient was advised to follow low-salt low-cholesterol diet    ideally I want to keep systolic blood pressure below 130 mmHg, patient was asked to check blood pressure one times a week and give me a report on that.  Patient will be follow-up in 3 months  or earlier as needed, patient will call me back for any change in the cardiovascular symptoms Patient was advised to buy a book from local bookstore concerning blood pressure and read several chapters  every day.  This will be supplemented by some of the material we will give him from the office.  Patient should also utilize other resources like YouTube and Internet to learn more about the blood pressure and the diet.      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      Lung nodules    Needs to follow-up with a low-dose CT scan      Abnormal abdominal CT scan    Patient was advised to lose weight, stop drinking completely      Abnormal cardiac CT angiography    Patient was advised to use his statin regularly and try to lose weight, stop drinking completely       Other Visit Diagnoses     Need for pneumococcal vaccination    -  Primary   Relevant Orders   Pneumococcal conjugate vaccine 20-valent (Prevnar 20) (Completed)       Meds ordered this encounter  Medications   ALPRAZolam (XANAX) 0.25 MG tablet    Sig: Take 1 tablet (0.25 mg total) by mouth at bedtime as needed for sleep.    Dispense:  30 tablet    Refill:  0    Follow-up: No follow-ups on file.    Cletis Athens, MD

## 2021-06-04 NOTE — Assessment & Plan Note (Signed)

## 2021-06-04 NOTE — Assessment & Plan Note (Signed)
Stable at the present time. 

## 2021-06-04 NOTE — Assessment & Plan Note (Signed)
Advised to use the CPAP

## 2021-06-04 NOTE — Assessment & Plan Note (Signed)
Behavioral modification strategies: increasing lean protein intake, decreasing simple carbohydrates, increasing vegetables, increasing water intake, decreasing eating out, no skipping meals, meal planning and cooking strategies, keeping healthy foods in the home and planning for success. 

## 2021-06-04 NOTE — Assessment & Plan Note (Signed)
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

## 2021-06-04 NOTE — Assessment & Plan Note (Signed)
Patient was advised to lose weight, stop drinking completely

## 2021-06-04 NOTE — Assessment & Plan Note (Signed)
Needs to follow-up with a low-dose CT scan

## 2021-06-04 NOTE — Assessment & Plan Note (Signed)
Patient was advised to use his statin regularly and try to lose weight, stop drinking completely

## 2021-06-04 NOTE — Assessment & Plan Note (Signed)
Patient was advised to lose weight 

## 2021-06-09 DIAGNOSIS — M1711 Unilateral primary osteoarthritis, right knee: Secondary | ICD-10-CM | POA: Diagnosis not present

## 2021-06-09 DIAGNOSIS — M25511 Pain in right shoulder: Secondary | ICD-10-CM | POA: Diagnosis not present

## 2021-06-14 DIAGNOSIS — M94211 Chondromalacia, right shoulder: Secondary | ICD-10-CM | POA: Diagnosis not present

## 2021-06-14 DIAGNOSIS — M7551 Bursitis of right shoulder: Secondary | ICD-10-CM | POA: Diagnosis not present

## 2021-06-14 DIAGNOSIS — M19011 Primary osteoarthritis, right shoulder: Secondary | ICD-10-CM | POA: Diagnosis not present

## 2021-06-14 DIAGNOSIS — M25511 Pain in right shoulder: Secondary | ICD-10-CM | POA: Diagnosis not present

## 2021-06-14 DIAGNOSIS — M778 Other enthesopathies, not elsewhere classified: Secondary | ICD-10-CM | POA: Diagnosis not present

## 2021-06-16 DIAGNOSIS — M1711 Unilateral primary osteoarthritis, right knee: Secondary | ICD-10-CM | POA: Diagnosis not present

## 2021-06-23 ENCOUNTER — Encounter: Payer: Self-pay | Admitting: *Deleted

## 2021-06-23 DIAGNOSIS — M1712 Unilateral primary osteoarthritis, left knee: Secondary | ICD-10-CM | POA: Diagnosis not present

## 2021-06-27 ENCOUNTER — Ambulatory Visit
Admission: RE | Admit: 2021-06-27 | Discharge: 2021-06-27 | Disposition: A | Discharge status: Home / Self Care | Payer: Medicare Other | Attending: Gastroenterology | Admitting: Gastroenterology

## 2021-06-27 ENCOUNTER — Ambulatory Visit: Payer: Medicare Other | Admitting: Certified Registered Nurse Anesthetist

## 2021-06-27 ENCOUNTER — Encounter
Admission: RE | Disposition: A | Discharge status: Home / Self Care | Payer: Self-pay | Source: Home / Self Care | Attending: Gastroenterology

## 2021-06-27 ENCOUNTER — Encounter: Payer: Self-pay | Admitting: *Deleted

## 2021-06-27 DIAGNOSIS — Z1211 Encounter for screening for malignant neoplasm of colon: Secondary | ICD-10-CM | POA: Diagnosis not present

## 2021-06-27 DIAGNOSIS — E785 Hyperlipidemia, unspecified: Secondary | ICD-10-CM | POA: Insufficient documentation

## 2021-06-27 DIAGNOSIS — K5289 Other specified noninfective gastroenteritis and colitis: Secondary | ICD-10-CM | POA: Diagnosis not present

## 2021-06-27 DIAGNOSIS — K2270 Barrett's esophagus without dysplasia: Secondary | ICD-10-CM | POA: Insufficient documentation

## 2021-06-27 DIAGNOSIS — K219 Gastro-esophageal reflux disease without esophagitis: Secondary | ICD-10-CM | POA: Diagnosis not present

## 2021-06-27 DIAGNOSIS — K449 Diaphragmatic hernia without obstruction or gangrene: Secondary | ICD-10-CM | POA: Diagnosis not present

## 2021-06-27 DIAGNOSIS — I2510 Atherosclerotic heart disease of native coronary artery without angina pectoris: Secondary | ICD-10-CM | POA: Insufficient documentation

## 2021-06-27 DIAGNOSIS — E669 Obesity, unspecified: Secondary | ICD-10-CM | POA: Insufficient documentation

## 2021-06-27 DIAGNOSIS — Z86010 Personal history of colonic polyps: Secondary | ICD-10-CM | POA: Diagnosis not present

## 2021-06-27 DIAGNOSIS — K529 Noninfective gastroenteritis and colitis, unspecified: Secondary | ICD-10-CM | POA: Insufficient documentation

## 2021-06-27 DIAGNOSIS — K2100 Gastro-esophageal reflux disease with esophagitis, without bleeding: Secondary | ICD-10-CM | POA: Diagnosis not present

## 2021-06-27 DIAGNOSIS — D122 Benign neoplasm of ascending colon: Secondary | ICD-10-CM | POA: Diagnosis not present

## 2021-06-27 DIAGNOSIS — K635 Polyp of colon: Secondary | ICD-10-CM | POA: Diagnosis not present

## 2021-06-27 DIAGNOSIS — D120 Benign neoplasm of cecum: Secondary | ICD-10-CM | POA: Insufficient documentation

## 2021-06-27 DIAGNOSIS — K5730 Diverticulosis of large intestine without perforation or abscess without bleeding: Secondary | ICD-10-CM | POA: Insufficient documentation

## 2021-06-27 DIAGNOSIS — I10 Essential (primary) hypertension: Secondary | ICD-10-CM | POA: Diagnosis not present

## 2021-06-27 DIAGNOSIS — G4733 Obstructive sleep apnea (adult) (pediatric): Secondary | ICD-10-CM | POA: Insufficient documentation

## 2021-06-27 DIAGNOSIS — Z6831 Body mass index (BMI) 31.0-31.9, adult: Secondary | ICD-10-CM | POA: Diagnosis not present

## 2021-06-27 DIAGNOSIS — K640 First degree hemorrhoids: Secondary | ICD-10-CM | POA: Diagnosis not present

## 2021-06-27 DIAGNOSIS — K649 Unspecified hemorrhoids: Secondary | ICD-10-CM | POA: Diagnosis not present

## 2021-06-27 HISTORY — DX: Gastro-esophageal reflux disease without esophagitis: K21.9

## 2021-06-27 HISTORY — DX: Malignant (primary) neoplasm, unspecified: C80.1

## 2021-06-27 HISTORY — PX: ESOPHAGOGASTRODUODENOSCOPY (EGD) WITH PROPOFOL: SHX5813

## 2021-06-27 HISTORY — PX: COLONOSCOPY WITH PROPOFOL: SHX5780

## 2021-06-27 SURGERY — ESOPHAGOGASTRODUODENOSCOPY (EGD) WITH PROPOFOL
Anesthesia: General

## 2021-06-27 MED ORDER — PROPOFOL 10 MG/ML IV BOLUS
INTRAVENOUS | Status: DC | PRN
  Administered 2021-06-27: 30 mg via INTRAVENOUS
  Administered 2021-06-27: 70 mg via INTRAVENOUS

## 2021-06-27 MED ORDER — EPHEDRINE SULFATE 50 MG/ML IJ SOLN
INTRAMUSCULAR | Status: DC | PRN
  Administered 2021-06-27 (×2): 5 mg via INTRAVENOUS

## 2021-06-27 MED ORDER — LIDOCAINE HCL (CARDIAC) PF 100 MG/5ML IV SOSY
PREFILLED_SYRINGE | INTRAVENOUS | Status: DC | PRN
  Administered 2021-06-27: 50 mg via INTRAVENOUS

## 2021-06-27 MED ORDER — LIDOCAINE HCL (PF) 2 % IJ SOLN
INTRAMUSCULAR | Status: AC
  Filled 2021-06-27: qty 5

## 2021-06-27 MED ORDER — PROPOFOL 500 MG/50ML IV EMUL
INTRAVENOUS | Status: AC
  Filled 2021-06-27: qty 50

## 2021-06-27 MED ORDER — PROPOFOL 500 MG/50ML IV EMUL
INTRAVENOUS | Status: DC | PRN
  Administered 2021-06-27: 175 ug/kg/min via INTRAVENOUS

## 2021-06-27 MED ORDER — SODIUM CHLORIDE 0.9 % IV SOLN
INTRAVENOUS | Status: DC
Start: 2021-06-27 — End: 2021-06-27

## 2021-06-27 NOTE — Transfer of Care (Signed)
Immediate Anesthesia Transfer of Care Note  Patient: Daniel Holden  Procedure(s) Performed: ESOPHAGOGASTRODUODENOSCOPY (EGD) WITH PROPOFOL COLONOSCOPY WITH PROPOFOL  Patient Location: PACU  Anesthesia Type:General  Level of Consciousness: awake and alert   Airway & Oxygen Therapy: Patient Spontanous Breathing  Post-op Assessment: Report given to RN and Post -op Vital signs reviewed and stable  Post vital signs: Reviewed and stable  Last Vitals:  Vitals Value Taken Time  BP 106/64 06/27/21 1155  Temp 35.6 C 06/27/21 1153  Pulse 59 06/27/21 1155  Resp 20 06/27/21 1155  SpO2 97 % 06/27/21 1155  Vitals shown include unvalidated device data.  Last Pain:  Vitals:   06/27/21 1153  TempSrc: Temporal  PainSc:          Complications: No notable events documented.

## 2021-06-27 NOTE — Interval H&P Note (Signed)
History and Physical Interval Note:  06/27/2021 11:09 AM  Daniel Holden  has presented today for surgery, with the diagnosis of GERD & PH Colon Polyps.  The various methods of treatment have been discussed with the patient and family. After consideration of risks, benefits and other options for treatment, the patient has consented to  Procedure(s): ESOPHAGOGASTRODUODENOSCOPY (EGD) WITH PROPOFOL (N/A) COLONOSCOPY WITH PROPOFOL (N/A) as a surgical intervention.  The patient's history has been reviewed, patient examined, no change in status, stable for surgery.  I have reviewed the patient's chart and labs.  Questions were answered to the patient's satisfaction.     [name]  Ok to proceed with EGD/Colonoscopy

## 2021-06-27 NOTE — Anesthesia Postprocedure Evaluation (Signed)
Anesthesia Post Note  Patient: Daniel Holden  Procedure(s) Performed: ESOPHAGOGASTRODUODENOSCOPY (EGD) WITH PROPOFOL COLONOSCOPY WITH PROPOFOL  Patient location during evaluation: Endoscopy Anesthesia Type: General Level of consciousness: awake and alert Pain management: pain level controlled Vital Signs Assessment: post-procedure vital signs reviewed and stable Respiratory status: spontaneous breathing, nonlabored ventilation, respiratory function stable and patient connected to nasal cannula oxygen Cardiovascular status: blood pressure returned to baseline and stable Postop Assessment: no apparent nausea or vomiting Anesthetic complications: no   No notable events documented.   Last Vitals:  Vitals:   06/27/21 1155 06/27/21 1213  BP: 104/66 118/78  Pulse: (!) 59 60  Resp: 18   Temp:    SpO2: 97%     Last Pain:  Vitals:   06/27/21 1155  TempSrc:   PainSc: 0-No pain                 Precious Haws [place]

## 2021-06-27 NOTE — Anesthesia Preprocedure Evaluation (Signed)
Anesthesia Evaluation  Patient identified by MRN, date of birth, ID band Patient awake    Reviewed: Allergy & Precautions, NPO status , Patient's Chart, lab work & pertinent test results  History of Anesthesia Complications Negative for: history of anesthetic complications  Airway Mallampati: III  TM Distance: <3 FB Neck ROM: full    Dental  (+) Chipped   Pulmonary neg shortness of breath, sleep apnea ,    Pulmonary exam normal        Cardiovascular Exercise Tolerance: Good hypertension, (-) angina+ CAD  Normal cardiovascular exam     Neuro/Psych PSYCHIATRIC DISORDERS  Neuromuscular disease negative psych ROS   GI/Hepatic Neg liver ROS, GERD  Medicated and Controlled,  Endo/Other  negative endocrine ROS  Renal/GU negative Renal ROS  negative genitourinary   Musculoskeletal   Abdominal   Peds  Hematology negative hematology ROS (+)   Anesthesia Other Findings Past Medical History: No date: Allergy No date: 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   No date: Cancer ([place]) No date: Carpal tunnel syndrome No date: GERD (gastroesophageal reflux disease) No date: HLD (hyperlipidemia) No date: Hypertension No date: Lung nodule No date: Obesity 2015: Pancreatitis No date: Sleep apnea  Past Surgical History: No date: CARPAL TUNNEL RELEASE 03/20/2021: ETHMOIDECTOMY; Right     Comment:  Procedure: ANTERIOR ETHMOIDECTOMY;  Surgeon: Clyde Canterbury, MD;  Location: [facility] ORS;  Service: ENT;                Laterality: Right; 03/20/2021: IMAGE GUIDED SINUS SURGERY; N/A     Comment:  Procedure: IMAGE GUIDED SINUS SURGERY;  Surgeon:                Clyde Canterbury, MD;  Location: [facility] ORS;  Service: ENT;                Laterality: N/A; 06/25/2019: KNEE ARTHROSCOPY WITH MEDIAL MENISECTOMY; Left     Comment:  Procedure: KNEE ARTHROSCOPY WITH MEDIAL MENISECTOMY;                Surgeon: Thornton Park, MD;  Location: [facility] ORS;                Service: Orthopedics;  Laterality: Left; 10/04/2020: LEFT HEART CATH AND CORONARY ANGIOGRAPHY; N/A     Comment:  Procedure: LEFT HEART CATH AND CORONARY ANGIOGRAPHY;                Surgeon: Leonie Man, MD;  Location: [facility]               [facility];  Service: Cardiovascular;  Laterality: N/A; 03/20/2021: MAXILLARY ANTROSTOMY; Right     Comment:  Procedure: MAXILLARY ANTROSTOMY WITH TISSUE REMOVAL;                Surgeon: Clyde Canterbury, MD;  Location: [facility] ORS;                Service: ENT;  Laterality: Right; No date: SHOULDER SURGERY No date: TONSILLECTOMY  BMI  Body Mass Index: 31.31 kg/m      Reproductive/Obstetrics negative OB ROS                             Anesthesia Physical Anesthesia Plan  ASA: 3  Anesthesia Plan: General   Post-op Pain Management:    Induction: Intravenous  PONV Risk Score and Plan: Propofol infusion and TIVA  Airway Management Planned: Natural Airway and Nasal Cannula  Additional Equipment:   Intra-op Plan:   Post-operative Plan:   Informed Consent: I have reviewed the patients History and Physical, chart, labs and discussed the procedure including the risks, benefits and alternatives for the proposed anesthesia with the patient or authorized representative who has indicated his/her understanding and acceptance.     Dental Advisory Given  Plan Discussed with: Anesthesiologist, CRNA and Surgeon  Anesthesia Plan Comments: (Patient consented for risks of anesthesia including but not limited to:  - adverse reactions to medications - risk of airway placement if required - damage to eyes, teeth, lips or other oral  mucosa - nerve damage due to positioning  - sore throat or hoarseness - Damage to heart, brain, nerves, lungs, other parts of body or loss of life  Patient voiced understanding.)        Anesthesia Quick Evaluation

## 2021-06-27 NOTE — H&P (Signed)
Outpatient short stay form Pre-procedure 06/27/2021  [name], MD  Primary Physician: Gladstone Lighter, MD  Reason for visit:  GERD/History of polyps  History of present illness:   76 y/o gentleman with history of CAD, HLD, and hypertension here for EGD for GERD and colonoscopy for history of polyps on colonoscopy done 5 years ago of unknown type. No blood thinners. No abdominal surgeries. No family history of GI malignancies.     Current Facility-Administered Medications:    0.9 %  sodium chloride infusion, , Intravenous, Continuous, Hisao Doo, Hilton Cork, MD, Last Rate: 20 mL/hr at 06/27/21 1101, Restarted at 06/27/21 1104  Medications Prior to Admission  Medication Sig Dispense Refill Last Dose   ALPRAZolam (XANAX) 0.25 MG tablet Take 1 tablet (0.25 mg total) by mouth at bedtime as needed for sleep. 30 tablet 0 Past Month   aspirin 81 MG EC tablet Take 81 mg by mouth daily.   06/26/2021   atorvastatin (LIPITOR) 80 MG tablet Take 1 tablet (80 mg total) by mouth daily. 90 tablet 3 06/26/2021   Cholecalciferol 250 MCG (10000 UT) TABS Take 1,000 Units by mouth daily.   06/27/2021   cyanocobalamin 1000 MCG tablet Take 1,000 mcg by mouth daily.   06/27/2021   fluticasone (FLONASE) 50 MCG/ACT nasal spray Place 2 sprays into both nostrils daily.    06/27/2021   losartan (COZAAR) 50 MG tablet Take 1 tablet (50 mg total) by mouth daily. 90 tablet 3 06/27/2021   melatonin 3 MG TABS tablet Take 10 mg by mouth at bedtime.   06/26/2021   metoprolol tartrate (LOPRESSOR) 50 MG tablet TAKE 1 TABLET TWICE A DAY 180 tablet 3 06/27/2021   naproxen sodium (ALEVE) 220 MG tablet Take 1 tablet (220 mg total) by mouth 2 (two) times daily as needed.   06/26/2021   omeprazole (PRILOSEC OTC) 20 MG tablet Take 10 mg by mouth daily.   06/26/2021   ranolazine (RANEXA) 1000 MG SR tablet Take 1 tablet (1,000 mg total) by mouth 2 (two) times daily. 180 tablet 3 06/27/2021   amLODipine (NORVASC) 5 MG tablet Take 1 tablet (5 mg  total) by mouth daily. 90 tablet 3    benazepril (LOTENSIN) 20 MG tablet Take 20 mg by mouth daily. (Patient not taking: Reported on 06/27/2021)   Not Taking   gabapentin (NEURONTIN) 300 MG capsule Take 300 mg by mouth at bedtime. (Patient not taking: Reported on 06/27/2021)   Not Taking   Gluc-Chonn-MSM-Boswellia-Vit D (GLUCOSAMINE CHONDROITIN + D3 PO) Take 1 tablet by mouth daily. (Patient not taking: Reported on 06/27/2021)   Not Taking   hydrochlorothiazide (HYDRODIURIL) 12.5 MG tablet Take 12.5 mg by mouth daily. (Patient not taking: Reported on 06/27/2021)   Not Taking   prednisoLONE acetate (PRED FORTE) 1 % ophthalmic suspension As directed (Patient not taking: Reported on 06/27/2021)   Not Taking   traMADol (ULTRAM) 50 MG tablet Take 50 mg by mouth every 6 (six) hours as needed. (Patient not taking: Reported on 06/27/2021)   Not Taking   UNABLE TO FIND 2 (two) times daily. Med Name: Budesonide 0.6 mg/ml + Gentamicin 80 mg/ml nasal rinse        Allergies  Allergen Reactions   Metronidazole Other (See Comments)    Patient stated that he had a moderate level of pain with his allergic reaction.    Dilaudid [Hydromorphone Hcl] Nausea And Vomiting     Past Medical History:  Diagnosis Date   Allergy    Angina  of effort ([place])    Anxiety    CAD (coronary artery disease), native coronary artery    coronary CTA showing aortic atherosclerosis with very high coronary Ca score at 4630 with severely calcified RCA and LAD > 70% and 50-69% prox to mid LCx.     Cancer [facility])    Carpal tunnel syndrome    GERD (gastroesophageal reflux disease)    HLD (hyperlipidemia)    Hypertension    Lung nodule    Obesity    Pancreatitis 2015   Sleep apnea     Review of systems:  Otherwise negative.    Physical Exam  Gen: Alert, oriented. Appears stated age.  HEENT: PERRLA. Lungs: No respiratory distress CV: RRR Abd: soft, benign, no masses Ext: No edema    Planned procedures: Proceed with  EGD/colonoscopy. The patient understands the nature of the planned procedure, indications, risks, alternatives and potential complications including but not limited to bleeding, infection, perforation, damage to internal organs and possible oversedation/side effects from anesthesia. The patient agrees and gives consent to proceed.  Please refer to procedure notes for findings, recommendations and patient disposition/instructions.     [name], MD [facility]

## 2021-06-27 NOTE — Op Note (Signed)
[facility] Gastroenterology Patient Name: Daniel Holden Procedure Date: 06/27/2021 10:56 AM MRN: [id number] Account #: [id number] Date of Birth: 08/10/1945 Admit Type: Outpatient Age: 76 Room: [facility] ENDO ROOM 1 Gender: Male Note Status: Finalized Instrument Name: Jasper Riling [id number] Procedure:             Colonoscopy Indications:           Surveillance: Personal history of colonic polyps                         (unknown histology) on last colonoscopy 5 years ago Providers:             Andrey Farmer MD, MD Medicines:             Monitored Anesthesia Care Complications:         No immediate complications. Estimated blood loss:                         Minimal. Procedure:             Pre-Anesthesia Assessment:                        - Prior to the procedure, a History and Physical was                         performed, and patient medications and allergies were                         reviewed. The patient is competent. The risks and                         benefits of the procedure and the sedation options and                         risks were discussed with the patient. All questions                         were answered and informed consent was obtained.                         Patient identification and proposed procedure were                         verified by the physician, the nurse, the                         anesthesiologist, the anesthetist and the technician                         in the endoscopy suite. Mental Status Examination:                         alert and oriented. Airway Examination: normal                         oropharyngeal airway and neck mobility. Respiratory                         Examination: clear to auscultation. CV Examination:  normal. Prophylactic Antibiotics: The patient does not                         require prophylactic antibiotics. Prior                         Anticoagulants: The patient has taken  no previous                         anticoagulant or antiplatelet agents. ASA Grade                         Assessment: II - A patient with mild systemic disease.                         After reviewing the risks and benefits, the patient                         was deemed in satisfactory condition to undergo the                         procedure. The anesthesia plan was to use monitored                         anesthesia care (MAC). Immediately prior to                         administration of medications, the patient was                         re-assessed for adequacy to receive sedatives. The                         heart rate, respiratory rate, oxygen saturations,                         blood pressure, adequacy of pulmonary ventilation, and                         response to care were monitored throughout the                         procedure. The physical status of the patient was                         re-assessed after the procedure.                        After obtaining informed consent, the colonoscope was                         passed under direct vision. Throughout the procedure,                         the patient's blood pressure, pulse, and oxygen                         saturations were monitored continuously. The  Colonoscope was introduced through the anus and                         advanced to the the terminal ileum. The colonoscopy                         was performed without difficulty. The patient                         tolerated the procedure well. The quality of the bowel                         preparation was adequate to identify polyps. Findings:      The perianal and digital rectal examinations were normal.      The terminal ileum appeared normal.      Three sessile polyps were found in the cecum. The polyps were less than       1 mm in size. These polyps were removed with a jumbo cold forceps.       Resection and retrieval were  complete. Estimated blood loss was minimal.      Two sessile polyps were found in the ascending colon. The polyps were 2       to 4 mm in size. These polyps were removed with a cold snare. Resection       and retrieval were complete. Estimated blood loss was minimal.      Scattered small-mouthed diverticula were found in the sigmoid colon,       descending colon and hepatic flexure.      Internal hemorrhoids were found during retroflexion. The hemorrhoids       were Grade I (internal hemorrhoids that do not prolapse).      The exam was otherwise without abnormality on direct and retroflexion       views. Impression:            - The examined portion of the ileum was normal.                        - Three less than 1 mm polyps in the cecum, removed                         with a jumbo cold forceps. Resected and retrieved.                        - Two 2 to 4 mm polyps in the ascending colon, removed                         with a cold snare. Resected and retrieved.                        - Diverticulosis in the sigmoid colon, in the                         descending colon and at the hepatic flexure.                        - Internal hemorrhoids.                        -  The examination was otherwise normal on direct and                         retroflexion views. Recommendation:        - Discharge patient to home.                        - Resume previous diet.                        - Continue present medications.                        - Await pathology results.                        - Repeat colonoscopy for surveillance based on                         pathology results.                        - Return to referring physician as previously                         scheduled. Procedure Code(s):     --- Professional ---                        [phone], Colonoscopy, flexible; with removal of                         tumor(s), polyp(s), or other lesion(s) by snare                          technique                        45380, 63, Colonoscopy, flexible; with biopsy, single                         or multiple Diagnosis Code(s):     --- Professional ---                        Z86.010, Personal history of colonic polyps                        K64.0, First degree hemorrhoids                        K63.5, Polyp of colon                        K57.30, Diverticulosis of large intestine without                         perforation or abscess without bleeding CPT copyright 2019 American Medical Association. All rights reserved. The codes documented in this report are preliminary and upon coder review may  be revised to meet current compliance requirements. Andrey Farmer MD, MD 06/27/2021 11:59:06 AM Number of Addenda: 0 Note Initiated On: 06/27/2021 10:56 AM Scope Withdrawal Time: 0 hours 15 minutes 10 seconds  Total Procedure Duration:  0 hours 21 minutes 26 seconds  Estimated Blood Loss:  Estimated blood loss was minimal.      [facility]

## 2021-06-27 NOTE — Anesthesia Procedure Notes (Signed)
Date/Time: 06/27/2021 11:15 AM Performed by: Johnna Acosta, CRNA Pre-anesthesia Checklist: Patient identified, Emergency Drugs available, Suction available, Patient being monitored and Timeout performed Patient Re-evaluated:Patient Re-evaluated prior to induction Oxygen Delivery Method: Nasal cannula Preoxygenation: Pre-oxygenation with 100% oxygen Induction Type: IV induction

## 2021-06-27 NOTE — Op Note (Signed)
[facility] Gastroenterology Patient Name: Daniel Holden Procedure Date: 06/27/2021 10:56 AM MRN: [id number] Account #: [id number] Date of Birth: July 05, 1945 Admit Type: Outpatient Age: 76 Room: [facility] ENDO ROOM 1 Gender: Male Note Status: Finalized Instrument Name: Upper Endoscope [id number] Procedure:             Upper GI endoscopy Indications:           Gastro-esophageal reflux disease Providers:             Andrey Farmer MD, MD Medicines:             Monitored Anesthesia Care Complications:         No immediate complications. Estimated blood loss:                         Minimal. Procedure:             Pre-Anesthesia Assessment:                        - Prior to the procedure, a History and Physical was                         performed, and patient medications and allergies were                         reviewed. The patient is competent. The risks and                         benefits of the procedure and the sedation options and                         risks were discussed with the patient. All questions                         were answered and informed consent was obtained.                         Patient identification and proposed procedure were                         verified by the physician, the nurse, the                         anesthesiologist, the anesthetist and the technician                         in the endoscopy suite. Mental Status Examination:                         alert and oriented. Airway Examination: normal                         oropharyngeal airway and neck mobility. Respiratory                         Examination: clear to auscultation. CV Examination:                         normal. Prophylactic Antibiotics: The patient does not  require prophylactic antibiotics. Prior                         Anticoagulants: The patient has taken no previous                         anticoagulant or antiplatelet agents. ASA  Grade                         Assessment: II - A patient with mild systemic disease.                         After reviewing the risks and benefits, the patient                         was deemed in satisfactory condition to undergo the                         procedure. The anesthesia plan was to use monitored                         anesthesia care (MAC). Immediately prior to                         administration of medications, the patient was                         re-assessed for adequacy to receive sedatives. The                         heart rate, respiratory rate, oxygen saturations,                         blood pressure, adequacy of pulmonary ventilation, and                         response to care were monitored throughout the                         procedure. The physical status of the patient was                         re-assessed after the procedure.                        After obtaining informed consent, the endoscope was                         passed under direct vision. Throughout the procedure,                         the patient's blood pressure, pulse, and oxygen                         saturations were monitored continuously. The Endoscope                         was introduced through the mouth, and advanced to the  second part of duodenum. The upper GI endoscopy was                         accomplished without difficulty. The patient tolerated                         the procedure well. Findings:      One tongue of salmon-colored mucosa was present. No other visible       abnormalities were present. The maximum longitudinal extent of these       esophageal mucosal changes was 1 cm in length. Biopsies were taken with       a cold forceps for histology. Estimated blood loss was minimal.      A small hiatal hernia was present.      The entire examined stomach was normal.      The examined duodenum was normal. 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 Biopsied.                        - Small hiatal hernia.                        - Normal stomach.                        - Normal examined duodenum. Recommendation:        - Await pathology results.                        - Perform a colonoscopy today. Procedure Code(s):     --- Professional ---                        [phone], Esophagogastroduodenoscopy, flexible,                         transoral; with biopsy, single or multiple Diagnosis Code(s):     --- Professional ---                        K22.70, Barrett's esophagus without dysplasia                        K44.9, Diaphragmatic hernia without obstruction or                         gangrene                        K21.9, Gastro-esophageal reflux disease without                         esophagitis CPT copyright 2019 American Medical Association. All rights reserved. The codes documented in this report are preliminary and upon coder review may  be revised to meet current compliance requirements. Andrey Farmer MD, MD 06/27/2021 11:53:07 AM Number of Addenda: 0 Note Initiated On: 06/27/2021 10:56 AM Estimated Blood Loss:  Estimated blood loss was minimal.  [facility]

## 2021-06-28 ENCOUNTER — Encounter: Payer: Self-pay | Admitting: Gastroenterology

## 2021-06-28 DIAGNOSIS — Z23 Encounter for immunization: Secondary | ICD-10-CM | POA: Diagnosis not present

## 2021-06-28 LAB — SURGICAL PATHOLOGY

## 2021-08-08 DIAGNOSIS — M1712 Unilateral primary osteoarthritis, left knee: Secondary | ICD-10-CM | POA: Diagnosis not present

## 2021-08-14 DIAGNOSIS — J329 Chronic sinusitis, unspecified: Secondary | ICD-10-CM | POA: Diagnosis not present

## 2021-08-14 DIAGNOSIS — J301 Allergic rhinitis due to pollen: Secondary | ICD-10-CM | POA: Diagnosis not present

## 2021-08-14 DIAGNOSIS — G4733 Obstructive sleep apnea (adult) (pediatric): Secondary | ICD-10-CM | POA: Diagnosis not present

## 2021-08-15 DIAGNOSIS — R739 Hyperglycemia, unspecified: Secondary | ICD-10-CM | POA: Diagnosis not present

## 2021-08-15 DIAGNOSIS — Z79899 Other long term (current) drug therapy: Secondary | ICD-10-CM | POA: Diagnosis not present

## 2021-08-15 DIAGNOSIS — D531 Other megaloblastic anemias, not elsewhere classified: Secondary | ICD-10-CM | POA: Diagnosis not present

## 2021-08-15 DIAGNOSIS — Z125 Encounter for screening for malignant neoplasm of prostate: Secondary | ICD-10-CM | POA: Diagnosis not present

## 2021-08-15 DIAGNOSIS — D7589 Other specified diseases of blood and blood-forming organs: Secondary | ICD-10-CM | POA: Diagnosis not present

## 2021-08-15 DIAGNOSIS — Z1159 Encounter for screening for other viral diseases: Secondary | ICD-10-CM | POA: Diagnosis not present

## 2021-08-15 DIAGNOSIS — D508 Other iron deficiency anemias: Secondary | ICD-10-CM | POA: Diagnosis not present

## 2021-08-22 DIAGNOSIS — D508 Other iron deficiency anemias: Secondary | ICD-10-CM | POA: Diagnosis not present

## 2021-08-22 DIAGNOSIS — Z9989 Dependence on other enabling machines and devices: Secondary | ICD-10-CM | POA: Diagnosis not present

## 2021-08-22 DIAGNOSIS — I25118 Atherosclerotic heart disease of native coronary artery with other forms of angina pectoris: Secondary | ICD-10-CM | POA: Diagnosis not present

## 2021-08-22 DIAGNOSIS — Z79899 Other long term (current) drug therapy: Secondary | ICD-10-CM | POA: Diagnosis not present

## 2021-08-22 DIAGNOSIS — G4733 Obstructive sleep apnea (adult) (pediatric): Secondary | ICD-10-CM | POA: Diagnosis not present

## 2021-09-13 ENCOUNTER — Ambulatory Visit: Payer: Medicare Other | Admitting: Cardiology

## 2021-09-20 ENCOUNTER — Encounter: Payer: Self-pay | Admitting: Cardiology

## 2021-10-04 NOTE — Progress Notes (Signed)
? ?Cardiology Office Note   ? ?Date:  10/05/2021  ? ?ID:  Daniel Holden, DOB 1946-02-13, MRN [id number] ? ?PCP:  Gladstone Lighter, MD  ?Cardiologist:  Fransico Him, MD  ? ?Chief Complaint  ?Patient presents with  ? Coronary Artery Disease  ? Hypertension  ? Hyperlipidemia  ? ? ?History of Present Illness:  ?Daniel Holden is a 76 y.o. male with a hx of anxiety, HTN , HLD and CAD with coronary CTA showing aortic atherosclerosis with very high coronary Ca score at 4630 with severely calcified RCA and LAD > 70% and 50-69% prox to mid LCx.  Cardiac cath 10/04/2020 showed severe single vessel CAD with occluded RCA with extensive calcifications and multiple different lesions including 90% proxm and distal lesions, 50% oLCx and 50-60% dLCx and 40% pLAD with heavily calcified vessels with bridging collaterals from the septal perforator and diagonal to the PDA. Medical management was recommended.  He was started on Ranexa and Imdur. ? ?He is here today for followup.  He has noticed that when he goes out to walk and starts up a hill he will get chest pain with radiation into both arms.  This is similar to prior to his heart cath.  He tells me that he is able to push through it and it dose not get any worse and is around 1-3 in severity and he can continue to walk up the hill and when he levels off the pain resolves.   He does have some mild DOE with the chest discomfort.  He is able to walk the walking track on level ground and does not get any pain or SOB. He denies any PND, orthopnea, LE edema, dizziness, palpitations or syncope. He is compliant with his meds and is tolerating meds with no SE.    ? ?Past Medical History:  ?Diagnosis Date  ? Allergy   ? Angina of effort [facility])   ? Anxiety   ? CAD (coronary artery disease), native coronary artery   ? coronary CTA showing aortic atherosclerosis with very high coronary Ca score at 4630 with severely calcified RCA and LAD > 70% and 50-69% prox to mid LCx.    ? Cancer [facility])    ? Carpal tunnel syndrome   ? GERD (gastroesophageal reflux disease)   ? HLD (hyperlipidemia)   ? Hypertension   ? Lung nodule   ? Obesity   ? Pancreatitis 2015  ? Sleep apnea   ? ? ?Past Surgical History:  ?Procedure Laterality Date  ? CARPAL TUNNEL RELEASE    ? COLONOSCOPY WITH PROPOFOL N/A 06/27/2021  ? Procedure: COLONOSCOPY WITH PROPOFOL;  Surgeon: [name], MD;  Location[facility] END[facility]COPY;  Service: Endoscopy;  Laterality: N/A;  ? ESOPHAGOGASTRODUODENOSCOPY (EGD) WITH PROPOFOL N/A 06/27/2021  ? Procedure: ESOPHAGOGASTRODUODENOSCOPY (EGD) WITH PROPOFOL;  Surgeon: [name], MD;  Location: [facility] ENDOSCOPY;  Service: Endoscopy;  Laterality: N/A;  ? ETHMOIDECTOMY Right 03/20/2021  ? Procedure: ANTERIOR ETHMOIDECTOMY;  Surgeon: Clyde Canterbury, MD;  Location: [facility] ORS;  Service: ENT;  Laterality: Right;  ? IMAGE GUIDED SINUS SURGERY N/A 03/20/2021  ? Procedure: IMAGE GUIDED SINUS SURGERY;  Surgeon: Clyde Canterbury, MD;  Location: [facility] ORS;  Service: ENT;  Laterality: N/A;  ? KNEE ARTHROSCOPY WITH MEDIAL MENISECTOMY Left 06/25/2019  ? Procedure: KNEE ARTHROSCOPY WITH MEDIAL MENISECTOMY;  Surgeon: Thornton Park, MD;  Location: [facility] ORS;  Service: Orthopedics;  Laterality: Left;  ? LEFT HEART CATH AND CORONARY ANGIOGRAPHY N/A 10/04/2020  ? Procedure: LEFT HEART CATH  AND CORONARY ANGIOGRAPHY;  Surgeon: Leonie Man, MD;  Location: [place] [facility];  Service: Cardiovascular;  Laterality: N/A;  ? MAXILLARY ANTROSTOMY Right 03/20/2021  ? Procedure: MAXILLARY ANTROSTOMY WITH TISSUE REMOVAL;  Surgeon: Clyde Canterbury, MD;  Location: [facility] ORS;  Service: ENT;  Laterality: Right;  ? SHOULDER SURGERY    ? TONSILLECTOMY    ? ? ?Current Medications: ?Current Meds  ?Medication Sig  ? ALPRAZolam (XANAX) 0.25 MG tablet Take 1 tablet (0.25 mg total) by mouth at bedtime as needed for sleep.  ? amLODipine (NORVASC) 5 MG tablet Take 1 tablet (5 mg total) by mouth daily.  ? aspirin 81 MG EC tablet Take 81 mg by mouth  daily.  ? atorvastatin (LIPITOR) 80 MG tablet Take 1 tablet (80 mg total) by mouth daily.  ? Cholecalciferol 250 MCG (10000 UT) TABS Take 1,000 Units by mouth daily.  ? cyanocobalamin 1000 MCG tablet Take 1,000 mcg by mouth daily.  ? fluticasone (FLONASE) 50 MCG/ACT nasal spray Place 2 sprays into both nostrils daily.   ? losartan (COZAAR) 50 MG tablet Take 1 tablet (50 mg total) by mouth daily.  ? melatonin 3 MG TABS tablet Take 10 mg by mouth at bedtime.  ? metoprolol tartrate (LOPRESSOR) 50 MG tablet TAKE 1 TABLET TWICE A DAY  ? naproxen sodium (ALEVE) 220 MG tablet Take 1 tablet (220 mg total) by mouth 2 (two) times daily as needed.  ? omeprazole (PRILOSEC OTC) 20 MG tablet Take 10 mg by mouth daily.  ? [DISCONTINUED] ranolazine (RANEXA) 1000 MG SR tablet Take 1 tablet (1,000 mg total) by mouth 2 (two) times daily.  ? ? ?Allergies:   Metronidazole, Ace inhibitors, and Dilaudid [hydromorphone hcl]  ? ?Social History  ? ?Socioeconomic History  ? Marital status: Married  ?  Spouse name: Not on file  ? Number of children: Not on file  ? Years of education: Not on file  ? Highest education level: Not on file  ?Occupational History  ? Not on file  ?Tobacco Use  ? Smoking status: Never  ? Smokeless tobacco: Never  ?Vaping Use  ? Vaping Use: Never used  ?Substance and Sexual Activity  ? Alcohol use: Yes  ?  Alcohol/week: 2.0 standard drinks  ?  Types: 2 Cans of beer per week  ?  Comment: 6 per day  ? Drug use: No  ? Sexual activity: Not on file  ?Other Topics Concern  ? Not on file  ?Social History Narrative  ? Not on file  ? ?Social Determinants of Health  ? ?Financial Resource Strain: Not on file  ?Food Insecurity: Not on file  ?Transportation Needs: Not on file  ?Physical Activity: Not on file  ?Stress: Not on file  ?Social Connections: Not on file  ?  ? ?Family History:  The patient's family history includes CVA in his father.  ? ?ROS:   ?Please see the history of present illness.    ?Review of Systems   ?Musculoskeletal:  Negative for muscle weakness.  All other systems reviewed and are negative. ? ?   ? View : No data to display.  ?  ?  ?  ? ? ? ? ? ?PHYSICAL EXAM:   ?VS:  BP 120/70   Pulse (!) 54   Ht '5\' 6"'$  (1.676 m)   Wt 207 lb (93.9 kg)   SpO2 98%   BMI 33.41 kg/m?    ?GEN: Well nourished, well developed in no acute distress ?HEENT: Normal ?NECK:  No JVD; No carotid bruits ?LYMPHATICS: No lymphadenopathy ?CARDIAC:RRR, no murmurs, rubs, gallops ?RESPIRATORY:  Clear to auscultation without rales, wheezing or rhonchi  ?ABDOMEN: Soft, non-tender, non-distended ?MUSCULOSKELETAL:  No edema; No deformity  ?SKIN: Warm and dry ?NEUROLOGIC:  Alert and oriented x 3 ?PSYCHIATRIC:  Normal affect   ?Wt Readings from Last 3 Encounters:  ?10/05/21 207 lb (93.9 kg)  ?06/27/21 194 lb (88 kg)  ?05/24/21 197 lb 8 oz (89.6 kg)  ?  ? ? ?Studies/Labs Reviewed:  ? ?EKG:  EKG is ordered today and demonstrates sinus bradycardia at 54 bpm.  ? ?Recent Labs: ?03/19/2021: ALT 7 ?03/21/2021: BUN 22; Creatinine, Ser 0.85; Potassium 4.3; Sodium 131 ?03/22/2021: Hemoglobin 11.0; Platelets 267  ? ?Lipid Panel ?   ?Component Value Date/Time  ? CHOL 104 11/07/2020 0741  ? CHOL 83 09/27/2013 0319  ? TRIG 111 11/07/2020 0741  ? TRIG 188 09/27/2013 0319  ? HDL 58 11/07/2020 0741  ? HDL 28 (L) 09/27/2013 0319  ? CHOLHDL 1.8 11/07/2020 0741  ? VLDL 38 09/27/2013 0319  ? [place] 26 11/07/2020 0741  ? [place] 17 09/27/2013 0319  ? ? ?Additional studies/ records that were reviewed today include:  ?Cardiac cath 10/04/2020 ?Diagnostic ?Dominance: Right ? ? ? ? ? ? ?ASSESSMENT:   ? ?1. Coronary artery disease due to lipid rich plaque   ?2. Primary hypertension   ?3. Pure hypercholesterolemia   ? ? ? ?PLAN:  ?In order of problems listed above: ? ?1.  ASCAD with chronic stable angina ?-coronary CTA showing aortic atherosclerosis with very high coronary Ca score at 4630 with severely calcified RCA and LAD > 70% and 50-69% prox to mid LCx.   ?-Cardiac cath  10/04/2020 showed severe single vessel CAD with occluded RCA with extensive calcifications and multiple different lesions including 90% prox and distal lesions, 50% oLCx and 50-60% dLCx and 40% pLAD with heavily calcified vess

## 2021-10-05 ENCOUNTER — Encounter: Payer: Self-pay | Admitting: Cardiology

## 2021-10-05 ENCOUNTER — Ambulatory Visit (INDEPENDENT_AMBULATORY_CARE_PROVIDER_SITE_OTHER): Payer: Medicare Other | Admitting: Cardiology

## 2021-10-05 VITALS — BP 120/70 | HR 54 | Ht 66.0 in | Wt 207.0 lb

## 2021-10-05 DIAGNOSIS — I10 Essential (primary) hypertension: Secondary | ICD-10-CM

## 2021-10-05 DIAGNOSIS — I2510 Atherosclerotic heart disease of native coronary artery without angina pectoris: Secondary | ICD-10-CM

## 2021-10-05 DIAGNOSIS — E7800 Pure hypercholesterolemia, unspecified: Secondary | ICD-10-CM | POA: Diagnosis not present

## 2021-10-05 DIAGNOSIS — I2583 Coronary atherosclerosis due to lipid rich plaque: Secondary | ICD-10-CM

## 2021-10-05 MED ORDER — ISOSORBIDE MONONITRATE ER 30 MG PO TB24: 30.0000 mg | ORAL_TABLET | Freq: Every day | ORAL | 3 refills | Status: DC

## 2021-10-05 MED ORDER — RANOLAZINE ER 1000 MG PO TB12: 1000.0000 mg | ORAL_TABLET | Freq: Two times a day (BID) | ORAL | 3 refills | Status: DC

## 2021-10-05 NOTE — Patient Instructions (Addendum)
Medication Instructions:  ?Your physician has recommended you make the following change in your medication: ?1) START taking Imdur (isosorbide mononitrate) 30 mg daily  ? ?*If you need a refill on your cardiac medications before your next appointment, please call your pharmacy* ? ?Follow-Up: ?At [facility], you and your health needs are our priority.  As part of our continuing mission to provide you with exceptional heart care, we have created designated Provider Care Teams.  These Care Teams include your primary Cardiologist (physician) and Advanced Practice Providers (APPs -  Physician Assistants and Nurse Practitioners) who all work together to provide you with the care you need, when you need it. ? ?Your next appointment:   ?May 25th at 10:30am ? ?The format for your next appointment:   ?In Person ? ?Provider:   ?Robbie Lis, PA-C       ? ? ? ?Important Information About Sugar ? ? ? ? ?  ?

## 2021-10-05 NOTE — Addendum Note (Signed)
Addended by: Antonieta Iba on: 10/05/2021 09:18 AM ? ? Modules accepted: Orders ? ?

## 2021-10-09 DIAGNOSIS — Z20822 Contact with and (suspected) exposure to covid-19: Secondary | ICD-10-CM | POA: Diagnosis not present

## 2021-10-12 ENCOUNTER — Encounter: Payer: Self-pay | Admitting: Cardiology

## 2021-10-12 MED ORDER — HYDROCHLOROTHIAZIDE 12.5 MG PO TABS
12.5000 mg | ORAL_TABLET | Freq: Every day | ORAL | 3 refills | Status: DC
Start: 2021-10-12 — End: 2021-12-11

## 2021-11-03 ENCOUNTER — Encounter: Payer: Self-pay | Admitting: Cardiology

## 2021-11-14 NOTE — Progress Notes (Unsigned)
Cardiology Office Note:    Date:  11/16/2021   ID:  Daniel Holden, DOB 1945-08-07, MRN [id number]  PCP:  Gladstone Lighter, MD  [facility] Cardiologist:  Fransico Him, MD  [facility] Electrophysiologist:  None   Chief Complaint: 6 weeks follow up  History of Present Illness:    Daniel Holden is a 76 y.o. male with a hx of medically managed CAD, hypertension, hyperlipidemia, anxiety and GERD presented for close follow-up.  Coronary CTA showing aortic atherosclerosis with very high coronary Ca score at 4630 with severely calcified RCA and LAD > 70% and 50-69% prox to mid LCx.  Cardiac cath 10/04/2020 showed severe single vessel CAD with occluded RCA with extensive calcifications and multiple different lesions including 90% proxm and distal lesions, 50% oLCx and 50-60% dLCx and 40% pLAD with heavily calcified vessels with bridging collaterals from the septal perforator and diagonal to the PDA. Medical management was recommended.  He was started on Ranexa and Imdur.  Reported chest pain and shortness of breath when seen by Dr. Radford Pax 10/05/2021.  Restarted Imdur 30 mg daily (previously had headache but it was found due to sinus infection).   Here today for follow up.  Patient reported continuous chest pain and shortness of breath with exertional activity.  He never required to take sublingual nitroglycerin.  His symptoms has been stable, likely minimally improved.  He is does not want to uptitrate Imdur further.  He knows his limitation.  He is able to do household chores and exercise.  Denies orthopnea, PND, syncope, melena.  He has chronic mild lower extremity edema which has improved on hydrochlorothiazide.  Past Medical History:  Diagnosis Date   Allergy    Angina of effort ([place])    Anxiety    CAD (coronary artery disease), native coronary artery    coronary CTA showing aortic atherosclerosis with very high coronary Ca score at 4630 with severely calcified RCA and LAD > 70%  and 50-69% prox to mid LCx.     Cancer ([place])    Carpal tunnel syndrome    GERD (gastroesophageal reflux disease)    HLD (hyperlipidemia)    Hypertension    Lung nodule    Obesity    Pancreatitis 2015   Sleep apnea     Past Surgical History:  Procedure Laterality Date   CARPAL TUNNEL RELEASE     COLONOSCOPY WITH PROPOFOL N/A 06/27/2021   Procedure: COLONOSCOPY WITH PROPOFOL;  Surgeon: [name], MD;  Location: [facility] ENDOSCOPY;  Service: Endoscopy;  Laterality: N/A;   ESOPHAGOGASTRODUODENOSCOPY (EGD) WITH PROPOFOL N/A 06/27/2021   Procedure: ESOPHAGOGASTRODUODENOSCOPY (EGD) WITH PROPOFOL;  Surgeon: [name], MD;  Location: [facility] ENDOSCOPY;  Service: Endoscopy;  Laterality: N/A;   ETHMOIDECTOMY Right 03/20/2021   Procedure: ANTERIOR ETHMOIDECTOMY;  Surgeon: Clyde Canterbury, MD;  Location: [facility] ORS;  Service: ENT;  Laterality: Right;   IMAGE GUIDED SINUS SURGERY N/A 03/20/2021   Procedure: IMAGE GUIDED SINUS SURGERY;  Surgeon: Clyde Canterbury, MD;  Location: [facility] ORS;  Service: ENT;  Laterality: N/A;   KNEE ARTHROSCOPY WITH MEDIAL MENISECTOMY Left 06/25/2019   Procedure: KNEE ARTHROSCOPY WITH MEDIAL MENISECTOMY;  Surgeon: Thornton Park, MD;  Location: [facility] ORS;  Service: Orthopedics;  Laterality: Left;   LEFT HEART CATH AND CORONARY ANGIOGRAPHY N/A 10/04/2020   Procedure: LEFT HEART CATH AND CORONARY ANGIOGRAPHY;  Surgeon: Leonie Man, MD;  Location: [place] [facility];  Service: Cardiovascular;  Laterality: N/A;   MAXILLARY ANTROSTOMY Right 03/20/2021   Procedure:  MAXILLARY ANTROSTOMY WITH TISSUE REMOVAL;  Surgeon: Clyde Canterbury, MD;  Location: [facility] ORS;  Service: ENT;  Laterality: Right;   SHOULDER SURGERY     TONSILLECTOMY      Current Medications: Current Meds  Medication Sig   ALPRAZolam (XANAX) 0.25 MG tablet Take 1 tablet (0.25 mg total) by mouth at bedtime as needed for sleep.   amLODipine (NORVASC) 5 MG tablet Take 1 tablet (5 mg total) by mouth daily.    aspirin 81 MG EC tablet Take 81 mg by mouth daily.   atorvastatin (LIPITOR) 80 MG tablet Take 1 tablet (80 mg total) by mouth daily.   Cholecalciferol 250 MCG (10000 UT) TABS Take 1,000 Units by mouth daily.   cyanocobalamin 1000 MCG tablet Take 1,000 mcg by mouth daily.   fluticasone (FLONASE) 50 MCG/ACT nasal spray Place 2 sprays into both nostrils daily.    hydrochlorothiazide (HYDRODIURIL) 12.5 MG tablet Take 1 tablet (12.5 mg total) by mouth daily.   isosorbide mononitrate (IMDUR) 30 MG 24 hr tablet Take 1 tablet (30 mg total) by mouth daily.   losartan (COZAAR) 50 MG tablet Take 1 tablet (50 mg total) by mouth daily.   melatonin 3 MG TABS tablet Take 10 mg by mouth at bedtime.   metoprolol tartrate (LOPRESSOR) 50 MG tablet TAKE 1 TABLET TWICE A DAY   naproxen sodium (ALEVE) 220 MG tablet Take 1 tablet (220 mg total) by mouth 2 (two) times daily as needed.   omeprazole (PRILOSEC OTC) 20 MG tablet Take 10 mg by mouth daily.   ranolazine (RANEXA) 1000 MG SR tablet Take 1 tablet (1,000 mg total) by mouth 2 (two) times daily.     Allergies:   Metronidazole, Ace inhibitors, and Dilaudid [hydromorphone hcl]   Social History   Socioeconomic History   Marital status: Married    Spouse name: Not on file   Number of children: Not on file   Years of education: Not on file   Highest education level: Not on file  Occupational History   Not on file  Tobacco Use   Smoking status: Never   Smokeless tobacco: Never  Vaping Use   Vaping Use: Never used  Substance and Sexual Activity   Alcohol use: Yes    Alcohol/week: 2.0 standard drinks    Types: 2 Cans of beer per week    Comment: 6 per day   Drug use: No   Sexual activity: Not on file  Other Topics Concern   Not on file  Social History Narrative   Not on file   Social Determinants of Health   Financial Resource Strain: Not on file  Food Insecurity: Not on file  Transportation Needs: Not on file  Physical Activity: Not on file   Stress: Not on file  Social Connections: Not on file     Family History: The patient's family history includes CVA in his father.    ROS:   Please see the history of present illness.    All other systems reviewed and are negative.   EKGs/Labs/Other Studies Reviewed:    The following studies were reviewed today:  LEFT HEART CATH AND CORONARY ANGIOGRAPHY  09/2020   Conclusion    Prox RCA lesion is 95% stenosed. Mid RCA to Dist RCA lesion is 100% stenosed. Dist RCA lesion is 90% stenosed with 80% stenosed side branch in RPAV. Dist LM to Prox LAD lesion is 20% stenosed with 50% stenosed side branch in Ost Cx. Prox LAD lesion is 40% stenosed.  Dist LAD lesion is 40% stenosed. Mid Cx lesion is 40% stenosed. Dist Cx lesion is 40% stenosed with 50% stenosed side branch in 3rd Mrg. ------------------------------------------------- The left ventricular systolic function is normal. The left ventricular ejection fraction is 55-65% by visual estimate. LV end diastolic pressure is mildly elevated. There is no aortic valve stenosis.   SUMMARY Severe single-vessel CAD with 100% heavily calcified RCA stenosis, there are extensive calcification, multiple different lesions noted including 90% proximal and distal just prior to bifurcation (seen via retrograde flow);  heavily calcified proximal LAD and LCx with 50% ostial LCx and 40% proximal LAD.  The LCx has diffuse moderate disease most notably 50 to 60% of the distal OM.   LAD has a tandem system with the major branch being a large septal perforator branch that courses in the direction of the LAD should.  The large "diagonal branch "actually is the vessel that reaches the apex.  Both bridging collaterals from the septal perforator and able collaterals from the "diagonal branch "provide collaterals to the PDA.   Hemodynamics: Preserved EF of 55 to 60%.  Cannot exclude mild inferior hypokinesis.  LVEDP 16 mmHg   RECOMMENDATIONS No obvious culprit  lesions for PCI, recommend optimize medical management. I will start amlodipine 2.5 mg daily along with Ranexa 500 mg p.o. twice daily Follow-up with Dr. Radford Pax  Diagnostic Dominance: Right   EKG:  EKG is not  ordered today.   Recent Labs: 03/19/2021: ALT 7 03/21/2021: BUN 22; Creatinine, Ser 0.85; Potassium 4.3; Sodium 131 03/22/2021: Hemoglobin 11.0; Platelets 267  Recent Lipid Panel    Component Value Date/Time   CHOL 104 11/07/2020 0741   CHOL 83 09/27/2013 0319   TRIG 111 11/07/2020 0741   TRIG 188 09/27/2013 0319   HDL 58 11/07/2020 0741   HDL 28 (L) 09/27/2013 0319   CHOLHDL 1.8 11/07/2020 0741   VLDL 38 09/27/2013 0319   LDLCALC 26 11/07/2020 0741   LDLCALC 17 09/27/2013 0319    Physical Exam:    VS:  BP 114/62   Pulse (!) 57   Ht '5\' 6"'$  (1.676 m)   Wt 212 lb 12.8 oz (96.5 kg)   SpO2 98%   BMI 34.35 kg/m     Wt Readings from Last 3 Encounters:  11/16/21 212 lb 12.8 oz (96.5 kg)  10/05/21 207 lb (93.9 kg)  06/27/21 194 lb (88 kg)     GEN:  Well nourished, well developed in no acute distress HEENT: Normal NECK: No JVD; No carotid bruits LYMPHATICS: No lymphadenopathy CARDIAC: RRR, no murmurs, rubs, gallops RESPIRATORY:  Clear to auscultation without rales, wheezing or rhonchi  ABDOMEN: Soft, non-tender, non-distended MUSCULOSKELETAL: Mild lower extremity edema; No deformity  SKIN: Warm and dry NEUROLOGIC:  Alert and oriented x 3 PSYCHIATRIC:  Normal affect   ASSESSMENT AND PLAN:    CAD with chronic stable angina Symptoms stable.  He knows his limitation.  Never required to use sublingual nitroglycerin.  Does not want further titration of antianginal.  Declined stress test.  Continue current therapy.  2.  Hypertension -Blood pressure stable and controlled on current medication.  3.  Hyperlipidemia -No results found for requested labs within last 8760 hours.  -Continue statin  4.  Mild lower extremity edema -Reports improvement on low-dose  hydrochlorothiazide.  Recommended low-sodium diet and leg elevation with compression stocking.  No orthopnea or PND.   Medication Adjustments/Labs and Tests Ordered: Current medicines are reviewed at length with the patient today.  Concerns regarding  medicines are outlined above.  No orders of the defined types were placed in this encounter.  No orders of the defined types were placed in this encounter.   Patient Instructions  Medication Instructions:  Your physician recommends that you continue on your current medications as directed. Please refer to the Current Medication list given to you today. *If you need a refill on your cardiac medications before your next appointment, please call your pharmacy*   Lab Work: None ordered If you have labs (blood work) drawn today and your tests are completely normal, you will receive your results only by: [place] (if you have MyChart) OR A paper copy in the mail If you have any lab test that is abnormal or we need to change your treatment, we will call you to review the results.   Testing/Procedures: None ordered   Follow-Up: At [facility], you and your health needs are our priority.  As part of our continuing mission to provide you with exceptional heart care, we have created designated Provider Care Teams.  These Care Teams include your primary Cardiologist (physician) and Advanced Practice Providers (APPs -  Physician Assistants and Nurse Practitioners) who all work together to provide you with the care you need, when you need it.  We recommend signing up for the patient portal called "MyChart".  Sign up information is provided on this After Visit Summary.  MyChart is used to connect with patients for Virtual Visits (Telemedicine).  Patients are able to view lab/test results, encounter notes, upcoming appointments, etc.  Non-urgent messages can be sent to your provider as well.   To learn more about what you can do with MyChart,  go to [email or web address].    Your next appointment:   4 month(s)  The format for your next appointment:   In Person  Provider:   Fransico Him, MD     Other Instructions   Important Information About Sugar         Jarrett Soho, [place]  11/16/2021 11:14 AM    [place]

## 2021-11-16 ENCOUNTER — Encounter: Payer: Self-pay | Admitting: Physician Assistant

## 2021-11-16 ENCOUNTER — Ambulatory Visit (INDEPENDENT_AMBULATORY_CARE_PROVIDER_SITE_OTHER): Payer: Medicare Other | Admitting: Physician Assistant

## 2021-11-16 VITALS — BP 114/62 | HR 57 | Ht 66.0 in | Wt 212.8 lb

## 2021-11-16 DIAGNOSIS — I25118 Atherosclerotic heart disease of native coronary artery with other forms of angina pectoris: Secondary | ICD-10-CM

## 2021-11-16 DIAGNOSIS — I2583 Coronary atherosclerosis due to lipid rich plaque: Secondary | ICD-10-CM | POA: Diagnosis not present

## 2021-11-16 DIAGNOSIS — R600 Localized edema: Secondary | ICD-10-CM

## 2021-11-16 DIAGNOSIS — I10 Essential (primary) hypertension: Secondary | ICD-10-CM

## 2021-11-16 DIAGNOSIS — E785 Hyperlipidemia, unspecified: Secondary | ICD-10-CM | POA: Diagnosis not present

## 2021-11-16 DIAGNOSIS — I2510 Atherosclerotic heart disease of native coronary artery without angina pectoris: Secondary | ICD-10-CM | POA: Diagnosis not present

## 2021-11-16 NOTE — Patient Instructions (Signed)
Medication Instructions:  Your physician recommends that you continue on your current medications as directed. Please refer to the Current Medication list given to you today. *If you need a refill on your cardiac medications before your next appointment, please call your pharmacy*   Lab Work: None ordered If you have labs (blood work) drawn today and your tests are completely normal, you will receive your results only by: [place] (if you have MyChart) OR A paper copy in the mail If you have any lab test that is abnormal or we need to change your treatment, we will call you to review the results.   Testing/Procedures: None ordered   Follow-Up: At [facility], you and your health needs are our priority.  As part of our continuing mission to provide you with exceptional heart care, we have created designated Provider Care Teams.  These Care Teams include your primary Cardiologist (physician) and Advanced Practice Providers (APPs -  Physician Assistants and Nurse Practitioners) who all work together to provide you with the care you need, when you need it.  We recommend signing up for the patient portal called "MyChart".  Sign up information is provided on this After Visit Summary.  MyChart is used to connect with patients for Virtual Visits (Telemedicine).  Patients are able to view lab/test results, encounter notes, upcoming appointments, etc.  Non-urgent messages can be sent to your provider as well.   To learn more about what you can do with MyChart, go to [email or web address].    Your next appointment:   4 month(s)  The format for your next appointment:   In Person  Provider:   Fransico Him, MD     Other Instructions   Important Information About Sugar

## 2021-12-04 ENCOUNTER — Encounter: Payer: Self-pay | Admitting: Cardiology

## 2021-12-07 NOTE — Telephone Encounter (Signed)
Spoke to pt. Pt scheduled to see APP on Monday Pt agreeable to plan.

## 2021-12-08 NOTE — Patient Outreach (Signed)
Received a referral notification for Mr. Gibb ---. I have assigned Raina Mina, RN to call for follow up and determine if there are any Case Management needs.    [facility], [place], [place] Management [phone]

## 2021-12-08 NOTE — Progress Notes (Unsigned)
Cardiology Office Note:    Date:  12/11/2021   ID:  Daniel Holden, DOB 1946-03-07, MRN [id number]  PCP:  Gladstone Lighter, MD   [facility] Providers Cardiologist:  Fransico Him, MD     Referring MD: Gladstone Lighter, MD   Chief Complaint: leg swelling  History of Present Illness:    Daniel Holden is a very pleasant 76 y.o. male with a hx of medically managed CAD, hypertension, hyperlipidemia, anxiety, and GERD.  Coronary CTA showing aortic atherosclerosis with very high coronary calcium score at 4630 with severely calcified RCA and LAD greater than 70% and 50 to 69% proximal to mid left circumflex.  Cardiac cath 10/04/2020 showed severe single-vessel CAD with occluded RCA with extensive calcifications and multiple different lesions including 90% proximal and distal lesions, 50% of oLCx and 50 to 60% distal LCx and 40% pLAD with heavily calcified vessels with bridging collaterals from the septal perforator and diagonal to the PDA.  Medical management recommended.  He was started on Ranexa and Imdur.  He reported chest pain and shortness of breath when seen by Dr. Radford Pax on 10/05/2021.  Restarted Imdur 30 mg daily (previously had headache but found it was due to sinus infection).  He was last seen in our office on 11/16/2021 by Robbie Lis, PA and reported continuous chest pain and shortness of breath with exertional activity.  He never took sublingual nitroglycerin.  His symptoms have been stable, likely minimally improved.  He does not want to uptitrate Imdur further.  He is able to do chores and exercise knowing his limitations.  He had chronic mild lower extremity edema improved on hydrochlorothiazide. No additional changes were made to treatment plan and he was advised to return in 4 months.  He contacted our office on 12/04/2021 reporting worsening leg swelling and abdominal tightness. Appointment for today was scheduled.  Today, he is here with his wife. Reports significant leg  swelling while on vacation, has discussed with Dr. Radford Pax and Robbie Lis, PA over the past few months now present when he wakes up in the monring. LVEF at time of cath 09/2020 was 55 to 60%, LVEDP 16 mmHg.  He resumed HCTZ 12.5 mg a few weeks ago and has not seen a significant improvement in swelling.  Today he only has mild edema in the right ankle. He denies significant DOE, orthopnea, or PND. Notes a sensation of needing to stop to rest if goes up incline too quickly. Has mild bilateral arm pain that is previous symptom prior to cardiac cath also when he goes up  an incline or attempts to do an activity too quickly. Denies presyncope, syncope, palpitations or bleeding concerns.   Past Medical History:  Diagnosis Date   Allergy    Angina of effort ([place])    Anxiety    CAD (coronary artery disease), native coronary artery    coronary CTA showing aortic atherosclerosis with very high coronary Ca score at 4630 with severely calcified RCA and LAD > 70% and 50-69% prox to mid LCx.     Cancer ([place])    Carpal tunnel syndrome    GERD (gastroesophageal reflux disease)    HLD (hyperlipidemia)    Hypertension    Lung nodule    Obesity    Pancreatitis 2015   Sleep apnea     Past Surgical History:  Procedure Laterality Date   CARPAL TUNNEL RELEASE     COLONOSCOPY WITH PROPOFOL N/A 06/27/2021   Procedure: COLONOSCOPY WITH PROPOFOL;  Surgeon:  [name], MD;  Location: [facility];  Service: Endoscopy;  Laterality: N/A;   ESOPHAGOGASTRODUODENOSCOPY (EGD) WITH PROPOFOL N/A 06/27/2021   Procedure: ESOPHAGOGASTRODUODENOSCOPY (EGD) WITH PROPOFOL;  Surgeon: [name], MD;  Location: [facility];  Service: Endoscopy;  Laterality: N/A;   ETHMOIDECTOMY Right 03/20/2021   Procedure: ANTERIOR ETHMOIDECTOMY;  Surgeon: Clyde Canterbury, MD;  Location: [facility] OR[facility];  Service: ENT;  Laterality: Right;   IMAGE GUIDED SINUS SURGERY N/A 03/20/2021   Procedure: IMAGE GUIDED SINUS SURGERY;  Surgeon:  Clyde Canterbury, MD;  Location: [facility] OR[facility];  Service: ENT;  Laterality: N/A;   KNEE ARTHROSCOPY WITH MEDIAL MENISECTOMY Left 06/25/2019   Procedure: KNEE ARTHROSCOPY WITH MEDIAL MENISECTOMY;  Surgeon: Thornton Park, MD;  Location: [facility] OR[facility];  Service: Orthopedics;  Laterality: Left;   LEFT HEART CATH AND CORONARY ANGIOGRAPHY N/A 10/04/2020   Procedure: LEFT HEART CATH AND CORONARY ANGIOGRAPHY;  Surgeon: Leonie Man, MD;  Location: [facility];  Service: Cardiovascular;  Laterality: N/A;   MAXILLARY ANTROSTOMY Right 03/20/2021   Procedure: MAXILLARY ANTROSTOMY WITH TISSUE REMOVAL;  Surgeon: Clyde Canterbury, MD;  Location: [facility];  Service: ENT;  Laterality: Right;   SHOULDER SURGERY     TONSILLECTOMY      Current Medications: Current Meds  Medication Sig   ALPRAZolam (XANAX) 0.25 MG tablet Take 1 tablet (0.25 mg total) by mouth at bedtime as needed for sleep.   amLODipine (NORVASC) 5 MG tablet Take 1 tablet (5 mg total) by mouth daily.   aspirin 81 MG EC tablet Take 81 mg by mouth daily.   atorvastatin (LIPITOR) 80 MG tablet Take 1 tablet (80 mg total) by mouth daily.   Cholecalciferol 250 MCG (10000 UT) TABS Take 1,000 Units by mouth daily.   cyanocobalamin 1000 MCG tablet Take 1,000 mcg by mouth daily.   fluticasone (FLONASE) 50 MCG/ACT nasal spray Place 2 sprays into both nostrils daily.    furosemide (LASIX) 20 MG tablet Take 1 tablet (20 mg total) by mouth daily.   isosorbide mononitrate (IMDUR) 30 MG 24 hr tablet Take 1 tablet (30 mg total) by mouth daily.   losartan (COZAAR) 50 MG tablet Take 1 tablet (50 mg total) by mouth daily.   melatonin 3 MG TABS tablet Take 10 mg by mouth at bedtime.   metoprolol tartrate (LOPRESSOR) 50 MG tablet TAKE 1 TABLET TWICE A DAY   naproxen sodium (ALEVE) 220 MG tablet Take 1 tablet (220 mg total) by mouth 2 (two) times daily as needed.   omeprazole (PRILOSEC OTC) 20 MG tablet Take 10 mg by mouth daily.   ranolazine (RANEXA) 1000 MG SR  tablet Take 1 tablet (1,000 mg total) by mouth 2 (two) times daily.   [DISCONTINUED] hydrochlorothiazide (HYDRODIURIL) 12.5 MG tablet Take 1 tablet (12.5 mg total) by mouth daily.     Allergies:   Metronidazole, Ace inhibitors, and Dilaudid [hydromorphone hcl]   Social History   Socioeconomic History   Marital status: Married    Spouse name: Not on file   Number of children: Not on file   Years of education: Not on file   Highest education level: Not on file  Occupational History   Not on file  Tobacco Use   Smoking status: Never   Smokeless tobacco: Never  Vaping Use   Vaping Use: Never used  Substance and Sexual Activity   Alcohol use: Yes    Alcohol/week: 2.0 standard drinks of alcohol    Types: 2 Cans of beer per week  Comment: 6 per day   Drug use: No   Sexual activity: Not on file  Other Topics Concern   Not on file  Social History Narrative   Not on file   Social Determinants of Health   Financial Resource Strain: Not on file  Food Insecurity: Not on file  Transportation Needs: Not on file  Physical Activity: Not on file  Stress: Not on file  Social Connections: Not on file     Family History: The patient's family history includes CVA in his father.  ROS:   Please see the history of present illness.    + bilateral arm discomfort + bilateral leg edema All other systems reviewed and are negative.  Labs/Other Studies Reviewed:    The following studies were reviewed today:  Prox RCA lesion is 95% stenosed. Mid RCA to Dist RCA lesion is 100% stenosed. Dist RCA lesion is 90% stenosed with 80% stenosed side branch in RPAV. Dist LM to Prox LAD lesion is 20% stenosed with 50% stenosed side branch in Ost Cx. Prox LAD lesion is 40% stenosed. Dist LAD lesion is 40% stenosed. Mid Cx lesion is 40% stenosed. Dist Cx lesion is 40% stenosed with 50% stenosed side branch in 3rd Mrg. ------------------------------------------------- The left ventricular systolic  function is normal. The left ventricular ejection fraction is 55-65% by visual estimate. LV end diastolic pressure is mildly elevated. There is no aortic valve stenosis.   SUMMARY Severe single-vessel CAD with 100% heavily calcified RCA stenosis, there are extensive calcification, multiple different lesions noted including 90% proximal and distal just prior to bifurcation (seen via retrograde flow);  heavily calcified proximal LAD and LCx with 50% ostial LCx and 40% proximal LAD.  The LCx has diffuse moderate disease most notably 50 to 60% of the distal OM.   LAD has a tandem system with the major branch being a large septal perforator branch that courses in the direction of the LAD should.  The large "diagonal branch "actually is the vessel that reaches the apex.  Both bridging collaterals from the septal perforator and able collaterals from the "diagonal branch "provide collaterals to the PDA.   Hemodynamics: Preserved EF of 55 to 60%.  Cannot exclude mild inferior hypokinesis.  LVEDP 16 mmHg  Coronary CTA 09/29/20  IMPRESSION: 1. Very high coronary calcium score of 4630. This was 98th percentile for age and sex matched control.   2. Normal coronary origin with right dominance.   3. Heavily calcified coronary arteries causing severe stenosis (>70%) in the LAD and RCA.   4. Moderate stenosis in the LCx.   5. CAD-RADS 4 Severe stenosis. (70-99% or > 50% left main). Consider symptom-guided anti-ischemic pharmacotherapy as well as risk factor modification per guideline directed care.   6. Cardiac catheterization is recommended.    Recent Labs: 03/19/2021: ALT 7 03/21/2021: BUN 22; Creatinine, Ser 0.85; Potassium 4.3; Sodium 131 03/22/2021: Hemoglobin 11.0; Platelets 267  Recent Lipid Panel    Component Value Date/Time   CHOL 104 11/07/2020 0741   CHOL 83 09/27/2013 0319   TRIG 111 11/07/2020 0741   TRIG 188 09/27/2013 0319   HDL 58 11/07/2020 0741   HDL 28 (L) 09/27/2013 0319    CHOLHDL 1.8 11/07/2020 0741   VLDL 38 09/27/2013 0319   LDLCALC 26 11/07/2020 0741   LDLCALC 17 09/27/2013 0319     Risk Assessment/Calculations:      Physical Exam:    VS:  BP (!) 132/58   Pulse 64   Ht  $'5\' 6"'K$  (1.676 m)   Wt 212 lb 3.2 oz (96.3 kg)   SpO2 95%   BMI 34.25 kg/m     Wt Readings from Last 3 Encounters:  12/11/21 212 lb 3.2 oz (96.3 kg)  11/16/21 212 lb 12.8 oz (96.5 kg)  10/05/21 207 lb (93.9 kg)     GEN:  Well developed, obese gentleman in no acute distress HEENT: Normal NECK: No JVD; No carotid bruits CARDIAC: RRR, no murmurs, rubs, gallops RESPIRATORY:  Clear to auscultation without rales, wheezing or rhonchi  ABDOMEN: Soft, non-tender, non-distended MUSCULOSKELETAL:  No edema; No deformity. 2+ pedal pulses, equal bilaterally SKIN: Warm and dry NEUROLOGIC:  Alert and oriented x 3 PSYCHIATRIC:  Normal affect   EKG:  EKG is not ordered today.  Diagnoses:    1. Coronary artery disease of native artery of native heart with stable angina pectoris ([place])   2. Hyperlipidemia LDL goal <70   3. Bilateral lower extremity edema   4. Essential hypertension    Assessment and Plan:     Leg edema: Concerns about persistent leg edema.  We discussed various causes including heart failure, arrhythmia, venous insufficiency, and high sodium diet.  No palpitations, no indication on home BP monitor of abnormal rhythm.  We will get an echocardiogram to evaluate heart and valve function.  Advised low-sodium heart healthy diet.  Regular walking for exercise and leg elevation at rest.  Will discontinue HCTZ and start Lasix 20 mg once daily. Monitor BP on Lasix. Will get bmet in 2 weeks.    CAD with stable angina: Cardiac cath 09/2020 revealed severe single vessel CAD of RCA with multiple lesions, heavily calcified prox LAD and LCx with no lesions for PCI, medical management recommended. Symptoms of bilateral arm discomfort similar to what he had prior to cardiac cath on  occasion, most commonly when he tries to do something quickly or going up an incline.  Getting echocardiogram to evaluate heart function. Could consider stress test if echo is unrevealing and angina persists. Had significant hypotension on Imdur 60 mg, feels better on 30 mg daily. Continue amlodipine, Ranexa, metoprolol, aspirin, statin.   Hypertension: BP is stable today with similar readings at home (120s-130s/60s-70s).  He has been taking HCTZ with little improvement in leg swelling. We will try Lasix 20 mg in the place of HCTZ. Advised him to continue to monitor water and sodium intake in the setting of low normal Na+ level. Will recheck bmet in 2 weeks.   Hyperlipidemia LDL goal < 70: LDL 27 on 08/15/21. Continue atorvastatin.     Disposition: Keep your appointment in 3 months with Dr. Radford Pax  Medication Adjustments/Labs and Tests Ordered: Current medicines are reviewed at length with the patient today.  Concerns regarding medicines are outlined above.  Orders Placed This Encounter  Procedures   Basic Metabolic Panel (BMET)   ECHOCARDIOGRAM COMPLETE   Meds ordered this encounter  Medications   furosemide (LASIX) 20 MG tablet    Sig: Take 1 tablet (20 mg total) by mouth daily.    Dispense:  30 tablet    Refill:  6    Patient Instructions  Medication Instructions:   DISCONTINUE HCTZ  START Lasix one (1) tablet by mouth ( 20 mg) daily.   *If you need a refill on your cardiac medications before your next appointment, please call your pharmacy*   Lab Work:  Your physician recommends that you return for lab work in [place] on the same days as the echo.  If you have labs (blood work) drawn today and your tests are completely normal, you will receive your results only by: [place] (if you have MyChart) OR A paper copy in the mail If you have any lab test that is abnormal or we need to change your treatment, we will call you to review the  results.   Testing/Procedures:  Your physician has requested that you have an echocardiogram. Echocardiography is a painless test that uses sound waves to create images of your heart. It provides your doctor with information about the size and shape of your heart and how well your heart's chambers and valves are working. This procedure takes approximately one hour. There are no restrictions for this procedure.IN [place] SAME DAY LABS.     Follow-Up: At [facility], you and your health needs are our priority.  As part of our continuing mission to provide you with exceptional heart care, we have created designated Provider Care Teams.  These Care Teams include your primary Cardiologist (physician) and Advanced Practice Providers (APPs -  Physician Assistants and Nurse Practitioners) who all work together to provide you with the care you need, when you need it.  We recommend signing up for the patient portal called "MyChart".  Sign up information is provided on this After Visit Summary.  MyChart is used to connect with patients for Virtual Visits (Telemedicine).  Patients are able to view lab/test results, encounter notes, upcoming appointments, etc.  Non-urgent messages can be sent to your provider as well.   To learn more about what you can do with MyChart, go to [email or web address].    Your next appointment:   3 month(s)  The format for your next appointment:   In Person  Provider:   Fransico Him, MD     Other Instructions DASH Eating Plan [place] stands for Dietary Approaches to Stop Hypertension. The DASH eating plan is a healthy eating plan that has been shown to: Reduce high blood pressure (hypertension). Reduce your risk for type 2 diabetes, heart disease, and stroke. Help with weight loss. What are tips for following this plan? Reading food labels Check food labels for the amount of salt (sodium) per serving. Choose foods with less than 5 percent of the Daily Value of  sodium. Generally, foods with less than 300 milligrams (mg) of sodium per serving fit into this eating plan. To find whole grains, look for the word "whole" as the first word in the ingredient list. Shopping Buy products labeled as "low-sodium" or "no salt added." Buy fresh foods. Avoid canned foods and pre-made or frozen meals. Cooking Avoid adding salt when cooking. Use salt-free seasonings or herbs instead of table salt or sea salt. Check with your health care provider or pharmacist before using salt substitutes. Do not fry foods. Cook foods using healthy methods such as baking, boiling, grilling, roasting, and broiling instead. Cook with heart-healthy oils, such as olive, canola, avocado, soybean, or sunflower oil. Meal planning  Eat a balanced diet that includes: 4 or more servings of fruits and 4 or more servings of vegetables each day. Try to fill one-half of your plate with fruits and vegetables. 6-8 servings of whole grains each day. Less than 6 oz (170 g) of lean meat, poultry, or fish each day. A 3-oz (85-g) serving of meat is about the same size as a deck of cards. One egg equals 1 oz (28 g). 2-3 servings of low-fat dairy each day. One serving is 1 cup (237 mL). 1 serving  of nuts, seeds, or beans 5 times each week. 2-3 servings of heart-healthy fats. Healthy fats called omega-3 fatty acids are found in foods such as walnuts, flaxseeds, fortified milks, and eggs. These fats are also found in cold-water fish, such as sardines, salmon, and mackerel. Limit how much you eat of: Canned or prepackaged foods. Food that is high in trans fat, such as some fried foods. Food that is high in saturated fat, such as fatty meat. Desserts and other sweets, sugary drinks, and other foods with added sugar. Full-fat dairy products. Do not salt foods before eating. Do not eat more than 4 egg yolks a week. Try to eat at least 2 vegetarian meals a week. Eat more home-cooked food and less restaurant,  buffet, and fast food. Lifestyle When eating at a restaurant, ask that your food be prepared with less salt or no salt, if possible. If you drink alcohol: Limit how much you use to: 0-1 drink a day for women who are not pregnant. 0-2 drinks a day for men. Be aware of how much alcohol is in your drink. In the [place] one drink equals one 12 oz bottle of beer (355 mL), one 5 oz glass of wine (148 mL), or one 1 oz glass of hard liquor (44 mL). General information Avoid eating more than 2,300 mg of salt a day. If you have hypertension, you may need to reduce your sodium intake to 1,500 mg a day. Work with your health care provider to maintain a healthy body weight or to lose weight. Ask what an ideal weight is for you. Get at least 30 minutes of exercise that causes your heart to beat faster (aerobic exercise) most days of the week. Activities may include walking, swimming, or biking. Work with your health care provider or dietitian to adjust your eating plan to your individual calorie needs. What foods should I eat? Fruits All fresh, dried, or frozen fruit. Canned fruit in natural juice (without added sugar). Vegetables Fresh or frozen vegetables (raw, steamed, roasted, or grilled). Low-sodium or reduced-sodium tomato and vegetable juice. Low-sodium or reduced-sodium tomato sauce and tomato paste. Low-sodium or reduced-sodium canned vegetables. Grains Whole-grain or whole-wheat bread. Whole-grain or whole-wheat pasta. Brown rice. [place]. Bulgur. Whole-grain and low-sodium cereals. Pita bread. Low-fat, low-sodium crackers. Whole-wheat flour tortillas. Meats and other proteins Skinless chicken or [place]. Ground chicken or [place]. Pork with fat trimmed off. Fish and seafood. Egg whites. Dried beans, peas, or lentils. Unsalted nuts, nut butters, and seeds. Unsalted canned beans. Lean cuts of beef with fat trimmed off. Low-sodium, lean precooked or cured meat, such as sausages or meat  loaves. Dairy Low-fat (1%) or fat-free (skim) milk. Reduced-fat, low-fat, or fat-free cheeses. Nonfat, low-sodium ricotta or cottage cheese. Low-fat or nonfat yogurt. Low-fat, low-sodium cheese. Fats and oils Soft margarine without trans fats. Vegetable oil. Reduced-fat, low-fat, or light mayonnaise and salad dressings (reduced-sodium). Canola, safflower, olive, avocado, soybean, and sunflower oils. Avocado. Seasonings and condiments Herbs. Spices. Seasoning mixes without salt. Other foods Unsalted popcorn and pretzels. Fat-free sweets. The items listed above may not be a complete list of foods and beverages you can eat. Contact a dietitian for more information. What foods should I avoid? Fruits Canned fruit in a light or heavy syrup. Fried fruit. Fruit in cream or butter sauce. Vegetables Creamed or fried vegetables. Vegetables in a cheese sauce. Regular canned vegetables (not low-sodium or reduced-sodium). Regular canned tomato sauce and paste (not low-sodium or reduced-sodium). Regular tomato and vegetable juice (not  low-sodium or reduced-sodium). Angie Fava. Olives. Grains Baked goods made with fat, such as croissants, muffins, or some breads. Dry pasta or rice meal packs. Meats and other proteins Fatty cuts of meat. Ribs. Fried meat. Berniece Salines. Bologna, salami, and other precooked or cured meats, such as sausages or meat loaves. Fat from the back of a pig (fatback). Bratwurst. Salted nuts and seeds. Canned beans with added salt. Canned or smoked fish. Whole eggs or egg yolks. Chicken or [place] with skin. Dairy Whole or 2% milk, cream, and half-and-half. Whole or full-fat cream cheese. Whole-fat or sweetened yogurt. Full-fat cheese. Nondairy creamers. Whipped toppings. Processed cheese and cheese spreads. Fats and oils Butter. Stick margarine. Lard. Shortening. Ghee. Bacon fat. Tropical oils, such as coconut, palm kernel, or palm oil. Seasonings and condiments Onion salt, garlic salt, seasoned  salt, table salt, and sea salt. Worcestershire sauce. Tartar sauce. Barbecue sauce. Teriyaki sauce. Soy sauce, including reduced-sodium. Steak sauce. Canned and packaged gravies. Fish sauce. Oyster sauce. Cocktail sauce. Store-bought horseradish. Ketchup. Mustard. Meat flavorings and tenderizers. Bouillon cubes. Hot sauces. Pre-made or packaged marinades. Pre-made or packaged taco seasonings. Relishes. Regular salad dressings. Other foods Salted popcorn and pretzels. The items listed above may not be a complete list of foods and beverages you should avoid. Contact a dietitian for more information. Where to find more information National Heart, Lung, and Blood Institute: https://wilson-eaton.com/ American Heart Association: www.heart.org Academy of Nutrition and Dietetics: www.eatright.[place]: www.kidney.org Summary The DASH eating plan is a healthy eating plan that has been shown to reduce high blood pressure (hypertension). It may also reduce your risk for type 2 diabetes, heart disease, and stroke. When on the DASH eating plan, aim to eat more fresh fruits and vegetables, whole grains, lean proteins, low-fat dairy, and heart-healthy fats. With the DASH eating plan, you should limit salt (sodium) intake to 2,300 mg a day. If you have hypertension, you may need to reduce your sodium intake to 1,500 mg a day. Work with your health care provider or dietitian to adjust your eating plan to your individual calorie needs. This information is not intended to replace advice given to you by your health care provider. Make sure you discuss any questions you have with your health care provider. Document Revised: 05/15/2019 Document Reviewed: 05/15/2019 Elsevier Patient Education  [place]         Signed, Emmaline Life, NP  12/11/2021 4:59 PM    [facility]

## 2021-12-11 ENCOUNTER — Ambulatory Visit (INDEPENDENT_AMBULATORY_CARE_PROVIDER_SITE_OTHER): Payer: Medicare Other | Admitting: Nurse Practitioner

## 2021-12-11 ENCOUNTER — Encounter: Payer: Self-pay | Admitting: Nurse Practitioner

## 2021-12-11 ENCOUNTER — Other Ambulatory Visit: Payer: Self-pay | Admitting: *Deleted

## 2021-12-11 VITALS — BP 132/58 | HR 64 | Ht 66.0 in | Wt 212.2 lb

## 2021-12-11 DIAGNOSIS — E785 Hyperlipidemia, unspecified: Secondary | ICD-10-CM

## 2021-12-11 DIAGNOSIS — I25118 Atherosclerotic heart disease of native coronary artery with other forms of angina pectoris: Secondary | ICD-10-CM

## 2021-12-11 DIAGNOSIS — I10 Essential (primary) hypertension: Secondary | ICD-10-CM | POA: Diagnosis not present

## 2021-12-11 DIAGNOSIS — R600 Localized edema: Secondary | ICD-10-CM

## 2021-12-11 MED ORDER — FUROSEMIDE 20 MG PO TABS: 20.0000 mg | ORAL_TABLET | Freq: Every day | ORAL | 6 refills | Status: DC

## 2021-12-11 NOTE — Patient Instructions (Signed)
Medication Instructions:   DISCONTINUE HCTZ  START Lasix one (1) tablet by mouth ( 20 mg) daily.   *If you need a refill on your cardiac medications before your next appointment, please call your pharmacy*   Lab Work:  Your physician recommends that you return for lab work in [place] on the same days as the echo.    If you have labs (blood work) drawn today and your tests are completely normal, you will receive your results only by: [place] (if you have MyChart) OR A paper copy in the mail If you have any lab test that is abnormal or we need to change your treatment, we will call you to review the results.   Testing/Procedures:  Your physician has requested that you have an echocardiogram. Echocardiography is a painless test that uses sound waves to create images of your heart. It provides your doctor with information about the size and shape of your heart and how well your heart's chambers and valves are working. This procedure takes approximately one hour. There are no restrictions for this procedure.IN [place] SAME DAY LABS.     Follow-Up: At [facility], you and your health needs are our priority.  As part of our continuing mission to provide you with exceptional heart care, we have created designated Provider Care Teams.  These Care Teams include your primary Cardiologist (physician) and Advanced Practice Providers (APPs -  Physician Assistants and Nurse Practitioners) who all work together to provide you with the care you need, when you need it.  We recommend signing up for the patient portal called "MyChart".  Sign up information is provided on this After Visit Summary.  MyChart is used to connect with patients for Virtual Visits (Telemedicine).  Patients are able to view lab/test results, encounter notes, upcoming appointments, etc.  Non-urgent messages can be sent to your provider as well.   To learn more about what you can do with MyChart, go to  [email or web address].    Your next appointment:   3 month(s)  The format for your next appointment:   In Person  Provider:   Fransico Him, MD     Other Instructions DASH Eating Plan [place] stands for Dietary Approaches to Stop Hypertension. The DASH eating plan is a healthy eating plan that has been shown to: Reduce high blood pressure (hypertension). Reduce your risk for type 2 diabetes, heart disease, and stroke. Help with weight loss. What are tips for following this plan? Reading food labels Check food labels for the amount of salt (sodium) per serving. Choose foods with less than 5 percent of the Daily Value of sodium. Generally, foods with less than 300 milligrams (mg) of sodium per serving fit into this eating plan. To find whole grains, look for the word "whole" as the first word in the ingredient list. Shopping Buy products labeled as "low-sodium" or "no salt added." Buy fresh foods. Avoid canned foods and pre-made or frozen meals. Cooking Avoid adding salt when cooking. Use salt-free seasonings or herbs instead of table salt or sea salt. Check with your health care provider or pharmacist before using salt substitutes. Do not fry foods. Cook foods using healthy methods such as baking, boiling, grilling, roasting, and broiling instead. Cook with heart-healthy oils, such as olive, canola, avocado, soybean, or sunflower oil. Meal planning  Eat a balanced diet that includes: 4 or more servings of fruits and 4 or more servings of vegetables each day. Try to fill one-half of your plate with fruits  and vegetables. 6-8 servings of whole grains each day. Less than 6 oz (170 g) of lean meat, poultry, or fish each day. A 3-oz (85-g) serving of meat is about the same size as a deck of cards. One egg equals 1 oz (28 g). 2-3 servings of low-fat dairy each day. One serving is 1 cup (237 mL). 1 serving of nuts, seeds, or beans 5 times each week. 2-3 servings of heart-healthy fats.  Healthy fats called omega-3 fatty acids are found in foods such as walnuts, flaxseeds, fortified milks, and eggs. These fats are also found in cold-water fish, such as sardines, salmon, and mackerel. Limit how much you eat of: Canned or prepackaged foods. Food that is high in trans fat, such as some fried foods. Food that is high in saturated fat, such as fatty meat. Desserts and other sweets, sugary drinks, and other foods with added sugar. Full-fat dairy products. Do not salt foods before eating. Do not eat more than 4 egg yolks a week. Try to eat at least 2 vegetarian meals a week. Eat more home-cooked food and less restaurant, buffet, and fast food. Lifestyle When eating at a restaurant, ask that your food be prepared with less salt or no salt, if possible. If you drink alcohol: Limit how much you use to: 0-1 drink a day for women who are not pregnant. 0-2 drinks a day for men. Be aware of how much alcohol is in your drink. In the [place] one drink equals one 12 oz bottle of beer (355 mL), one 5 oz glass of wine (148 mL), or one 1 oz glass of hard liquor (44 mL). General information Avoid eating more than 2,300 mg of salt a day. If you have hypertension, you may need to reduce your sodium intake to 1,500 mg a day. Work with your health care provider to maintain a healthy body weight or to lose weight. Ask what an ideal weight is for you. Get at least 30 minutes of exercise that causes your heart to beat faster (aerobic exercise) most days of the week. Activities may include walking, swimming, or biking. Work with your health care provider or dietitian to adjust your eating plan to your individual calorie needs. What foods should I eat? Fruits All fresh, dried, or frozen fruit. Canned fruit in natural juice (without added sugar). Vegetables Fresh or frozen vegetables (raw, steamed, roasted, or grilled). Low-sodium or reduced-sodium tomato and vegetable juice. Low-sodium or reduced-sodium  tomato sauce and tomato paste. Low-sodium or reduced-sodium canned vegetables. Grains Whole-grain or whole-wheat bread. Whole-grain or whole-wheat pasta. Brown rice. [place]. Bulgur. Whole-grain and low-sodium cereals. Pita bread. Low-fat, low-sodium crackers. Whole-wheat flour tortillas. Meats and other proteins Skinless chicken or [place]. Ground chicken or [place]. Pork with fat trimmed off. Fish and seafood. Egg whites. Dried beans, peas, or lentils. Unsalted nuts, nut butters, and seeds. Unsalted canned beans. Lean cuts of beef with fat trimmed off. Low-sodium, lean precooked or cured meat, such as sausages or meat loaves. Dairy Low-fat (1%) or fat-free (skim) milk. Reduced-fat, low-fat, or fat-free cheeses. Nonfat, low-sodium ricotta or cottage cheese. Low-fat or nonfat yogurt. Low-fat, low-sodium cheese. Fats and oils Soft margarine without trans fats. Vegetable oil. Reduced-fat, low-fat, or light mayonnaise and salad dressings (reduced-sodium). Canola, safflower, olive, avocado, soybean, and sunflower oils. Avocado. Seasonings and condiments Herbs. Spices. Seasoning mixes without salt. Other foods Unsalted popcorn and pretzels. Fat-free sweets. The items listed above may not be a complete list of foods and beverages you can  eat. Contact a dietitian for more information. What foods should I avoid? Fruits Canned fruit in a light or heavy syrup. Fried fruit. Fruit in cream or butter sauce. Vegetables Creamed or fried vegetables. Vegetables in a cheese sauce. Regular canned vegetables (not low-sodium or reduced-sodium). Regular canned tomato sauce and paste (not low-sodium or reduced-sodium). Regular tomato and vegetable juice (not low-sodium or reduced-sodium). Angie Fava. Olives. Grains Baked goods made with fat, such as croissants, muffins, or some breads. Dry pasta or rice meal packs. Meats and other proteins Fatty cuts of meat. Ribs. Fried meat. Berniece Salines. Bologna, salami, and other  precooked or cured meats, such as sausages or meat loaves. Fat from the back of a pig (fatback). Bratwurst. Salted nuts and seeds. Canned beans with added salt. Canned or smoked fish. Whole eggs or egg yolks. Chicken or [place] with skin. Dairy Whole or 2% milk, cream, and half-and-half. Whole or full-fat cream cheese. Whole-fat or sweetened yogurt. Full-fat cheese. Nondairy creamers. Whipped toppings. Processed cheese and cheese spreads. Fats and oils Butter. Stick margarine. Lard. Shortening. Ghee. Bacon fat. Tropical oils, such as coconut, palm kernel, or palm oil. Seasonings and condiments Onion salt, garlic salt, seasoned salt, table salt, and sea salt. Worcestershire sauce. Tartar sauce. Barbecue sauce. Teriyaki sauce. Soy sauce, including reduced-sodium. Steak sauce. Canned and packaged gravies. Fish sauce. Oyster sauce. Cocktail sauce. Store-bought horseradish. Ketchup. Mustard. Meat flavorings and tenderizers. Bouillon cubes. Hot sauces. Pre-made or packaged marinades. Pre-made or packaged taco seasonings. Relishes. Regular salad dressings. Other foods Salted popcorn and pretzels. The items listed above may not be a complete list of foods and beverages you should avoid. Contact a dietitian for more information. Where to find more information National Heart, Lung, and Blood Institute: https://wilson-eaton.com/ American Heart Association: www.heart.org Academy of Nutrition and Dietetics: www.eatright.[place]: www.kidney.org Summary The DASH eating plan is a healthy eating plan that has been shown to reduce high blood pressure (hypertension). It may also reduce your risk for type 2 diabetes, heart disease, and stroke. When on the DASH eating plan, aim to eat more fresh fruits and vegetables, whole grains, lean proteins, low-fat dairy, and heart-healthy fats. With the DASH eating plan, you should limit salt (sodium) intake to 2,300 mg a day. If you have hypertension, you may need  to reduce your sodium intake to 1,500 mg a day. Work with your health care provider or dietitian to adjust your eating plan to your individual calorie needs. This information is not intended to replace advice given to you by your health care provider. Make sure you discuss any questions you have with your health care provider. Document Revised: 05/15/2019 Document Reviewed: 05/15/2019 Elsevier Patient Education  [place]

## 2022-01-02 DIAGNOSIS — M1711 Unilateral primary osteoarthritis, right knee: Secondary | ICD-10-CM | POA: Diagnosis not present

## 2022-01-02 DIAGNOSIS — M25511 Pain in right shoulder: Secondary | ICD-10-CM | POA: Diagnosis not present

## 2022-01-02 DIAGNOSIS — Z96652 Presence of left artificial knee joint: Secondary | ICD-10-CM | POA: Diagnosis not present

## 2022-01-09 DIAGNOSIS — M1711 Unilateral primary osteoarthritis, right knee: Secondary | ICD-10-CM | POA: Diagnosis not present

## 2022-01-11 ENCOUNTER — Encounter: Payer: Self-pay | Admitting: Cardiology

## 2022-01-11 ENCOUNTER — Other Ambulatory Visit
Admission: RE | Admit: 2022-01-11 | Discharge: 2022-01-11 | Disposition: A | Discharge status: Home / Self Care | Payer: Medicare Other | Attending: Nurse Practitioner | Admitting: Nurse Practitioner

## 2022-01-11 ENCOUNTER — Ambulatory Visit (INDEPENDENT_AMBULATORY_CARE_PROVIDER_SITE_OTHER): Payer: Medicare Other

## 2022-01-11 DIAGNOSIS — R600 Localized edema: Secondary | ICD-10-CM | POA: Insufficient documentation

## 2022-01-11 DIAGNOSIS — I10 Essential (primary) hypertension: Secondary | ICD-10-CM | POA: Diagnosis not present

## 2022-01-11 DIAGNOSIS — I25118 Atherosclerotic heart disease of native coronary artery with other forms of angina pectoris: Secondary | ICD-10-CM

## 2022-01-11 DIAGNOSIS — I5030 Unspecified diastolic (congestive) heart failure: Secondary | ICD-10-CM | POA: Insufficient documentation

## 2022-01-11 DIAGNOSIS — E785 Hyperlipidemia, unspecified: Secondary | ICD-10-CM | POA: Diagnosis not present

## 2022-01-11 LAB — BASIC METABOLIC PANEL
Anion gap: 5 (ref 5–15)
BUN: 16 mg/dL (ref 8–23)
CO2: 27 mmol/L (ref 22–32)
Calcium: 8.9 mg/dL (ref 8.9–10.3)
Chloride: 100 mmol/L (ref 98–111)
Creatinine, Ser: 0.81 mg/dL (ref 0.61–1.24)
GFR, Estimated: >60 mL/min (ref >60)
Glucose, Bld: 144 mg/dL — ABNORMAL HIGH (ref 70–99)
Potassium: 3.7 mmol/L (ref 3.5–5.1)
Sodium: 132 mmol/L — ABNORMAL LOW (ref 135–145)

## 2022-01-11 LAB — ECHOCARDIOGRAM COMPLETE
AR max vel: 1.66 cm2
AV Area VTI: 1.77 cm2
AV Area mean vel: 1.75 cm2
AV Mean grad: 4 mmHg
AV Peak grad: 8.8 mmHg
Ao pk vel: 1.48 m/s
Area-P 1/2: 3.2 cm2
Calc EF: 64.1 %
S' Lateral: 2.6 cm
Single Plane A2C EF: 61.6 %
Single Plane A4C EF: 64.4 %

## 2022-01-15 DIAGNOSIS — E871 Hypo-osmolality and hyponatremia: Secondary | ICD-10-CM | POA: Diagnosis not present

## 2022-01-15 DIAGNOSIS — R7303 Prediabetes: Secondary | ICD-10-CM | POA: Diagnosis not present

## 2022-01-16 DIAGNOSIS — M1711 Unilateral primary osteoarthritis, right knee: Secondary | ICD-10-CM | POA: Diagnosis not present

## 2022-02-05 DIAGNOSIS — J301 Allergic rhinitis due to pollen: Secondary | ICD-10-CM | POA: Diagnosis not present

## 2022-02-05 DIAGNOSIS — G4733 Obstructive sleep apnea (adult) (pediatric): Secondary | ICD-10-CM | POA: Diagnosis not present

## 2022-02-05 DIAGNOSIS — J329 Chronic sinusitis, unspecified: Secondary | ICD-10-CM | POA: Diagnosis not present

## 2022-02-13 DIAGNOSIS — Z23 Encounter for immunization: Secondary | ICD-10-CM | POA: Diagnosis not present

## 2022-02-14 IMAGING — CT CT CHEST W/O CM
2 of 4 series · 15 of 36 positions shown, 18 images · non-contrast
Comparison: 08/05/2020.

CLINICAL DATA: Shortness of breath and chest pain for 1 year.
Consolidative airspace disease seen on previous CT.

EXAM:
CT CHEST WITHOUT CONTRAST
TECHNIQUE: Multidetector CT imaging of the chest was performed following the
standard protocol without IV contrast.

[Series 7: chest 2.00 · axial · 0.70mm/px · z∈[-1235,-927]mm · 12 of 182 slices shown, 15 images]
[im 14/182  mediastinal]
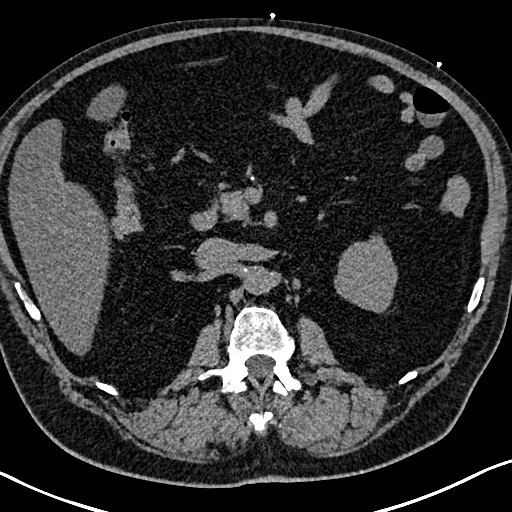
[im 14/182  lung]
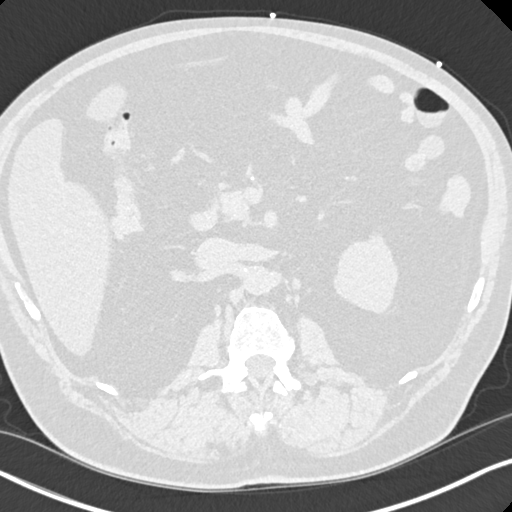
[im 28/182  lung]
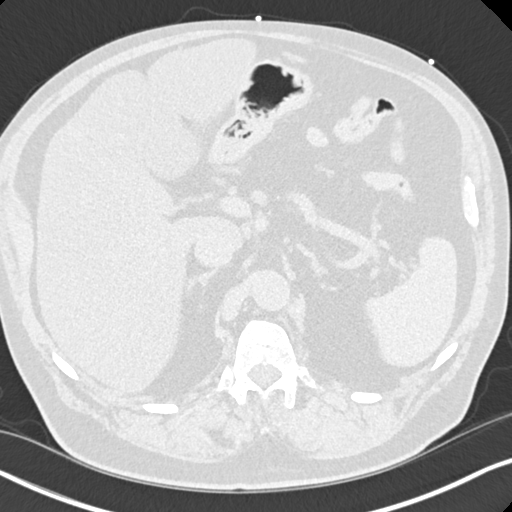
[im 42/182  lung]
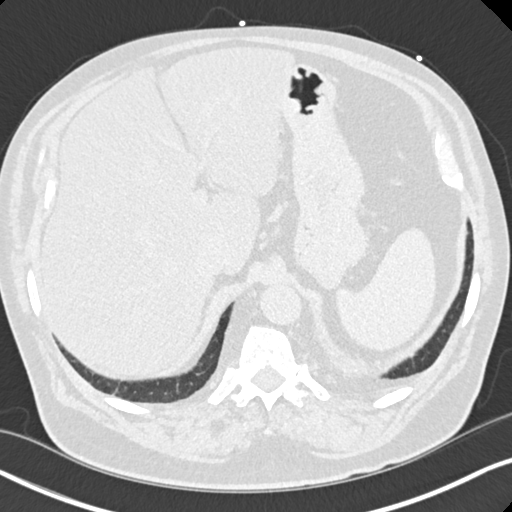
[im 56/182  lung]
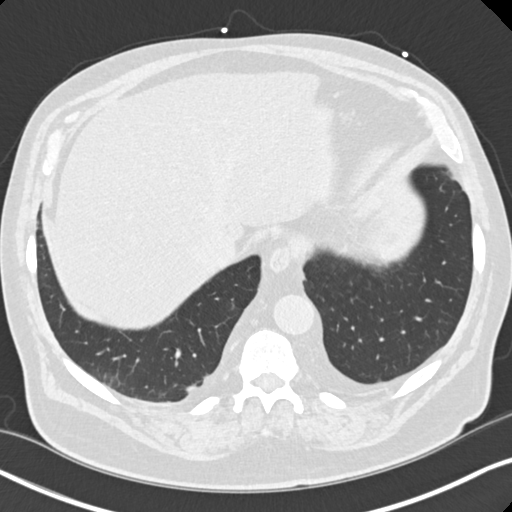
[im 70/182  mediastinal]
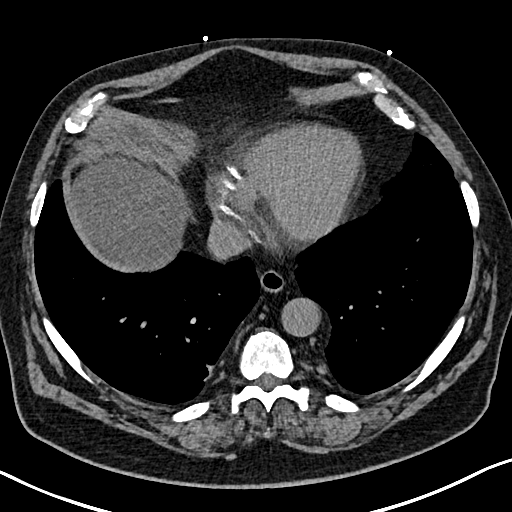
[im 70/182  lung]
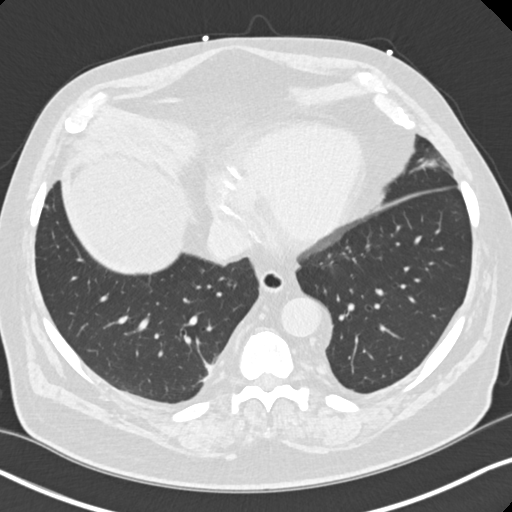
[im 84/182  lung]
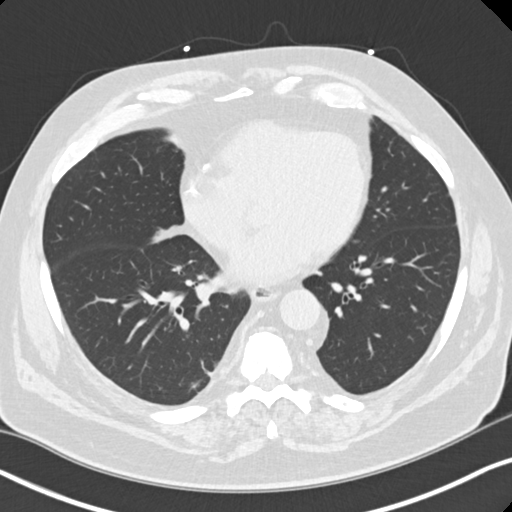
[im 98/182  lung]
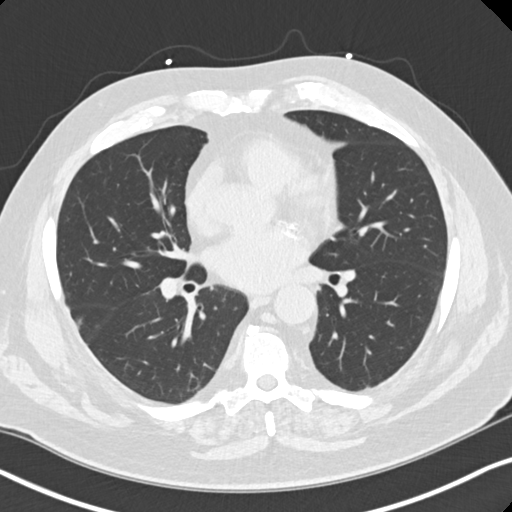
[im 112/182  lung]
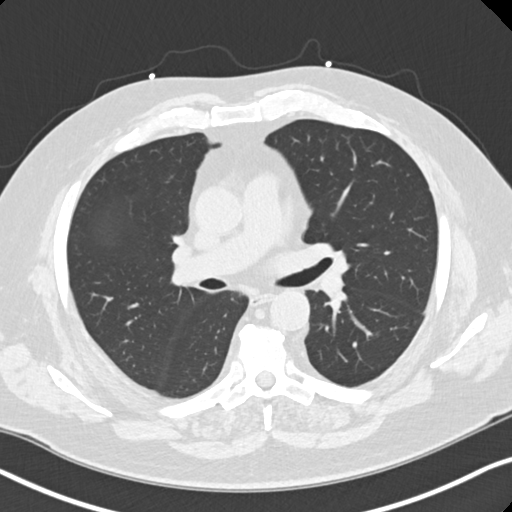
[im 126/182  mediastinal]
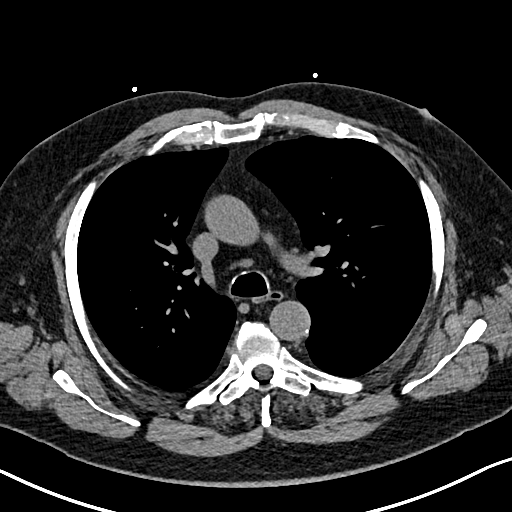
[im 126/182  lung]
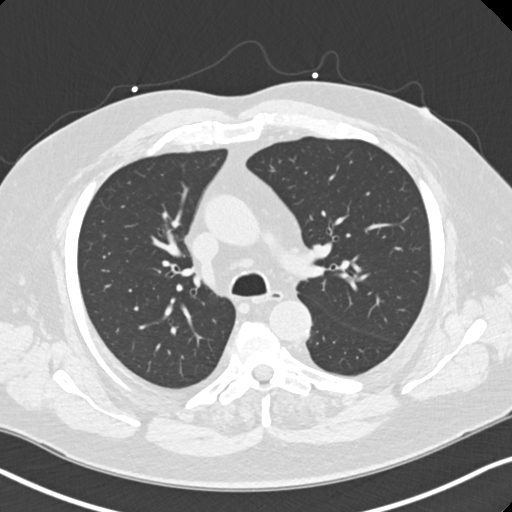
[im 140/182  lung]
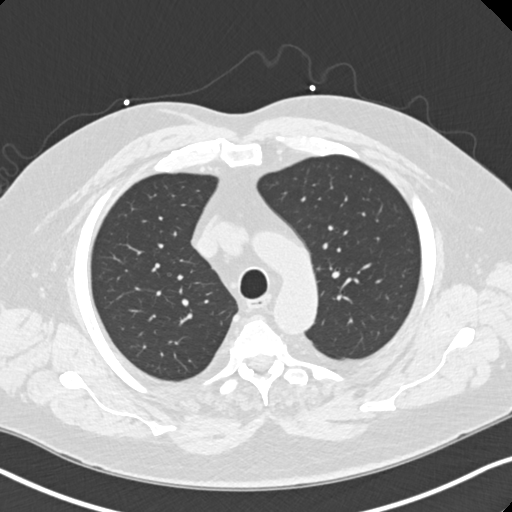
[im 154/182  lung]
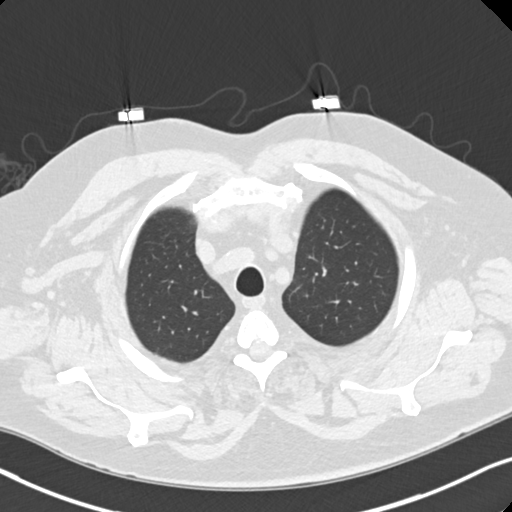
[im 168/182  lung]
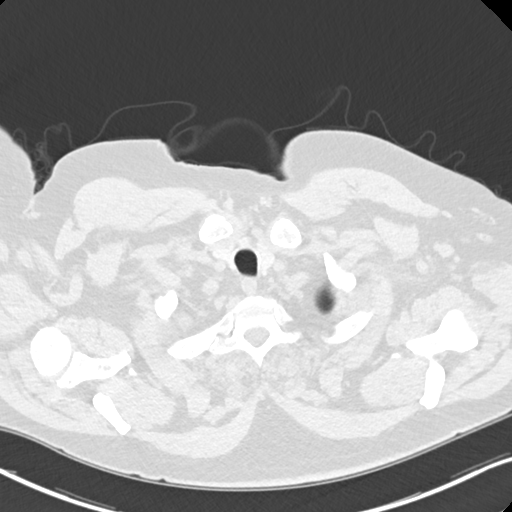

[Series 10: coronals chest 2.00 cor · coronal · 0.70mm/px · 3 of 164 slices shown]
[im 33/164  lung]
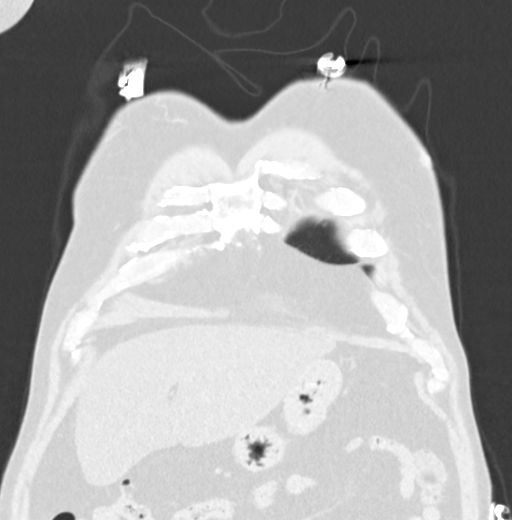
[im 66/164  lung]
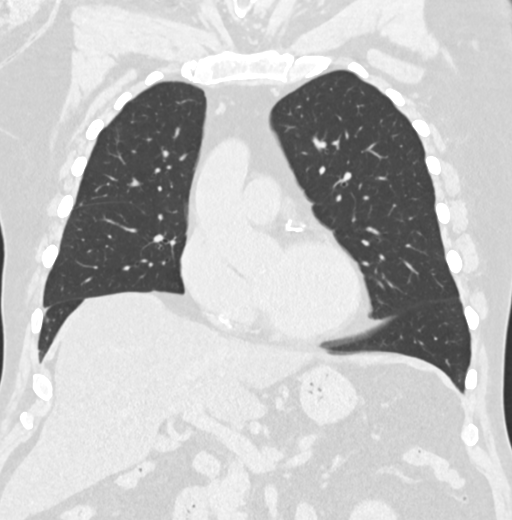
[im 98/164  lung]
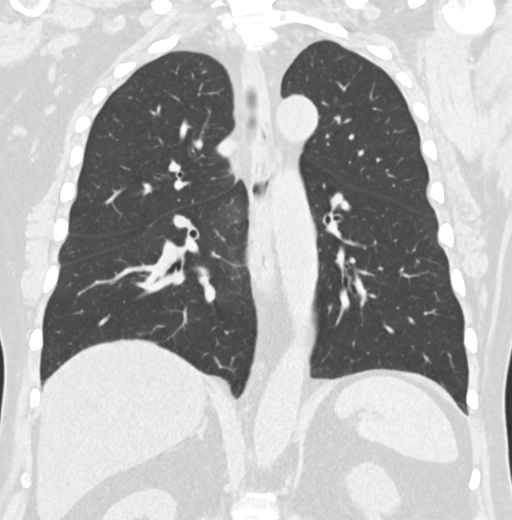

[15 of 36 positions shown; findings below may reference images not displayed]

FINDINGS: Cardiovascular: The heart size is normal. No substantial pericardial
effusion. Coronary artery calcification is evident. Atherosclerotic
calcification is noted in the wall of the thoracic aorta.

Mediastinum/Nodes: No mediastinal lymphadenopathy. No evidence for
gross hilar lymphadenopathy although assessment is limited by the
lack of intravenous contrast on today's study. The esophagus has
normal imaging features. There is no axillary lymphadenopathy.

Lungs/Pleura: 2 mm right upper lobe perifissural nodule seen on
image 72/8 today has decreased from 5 mm previously. Subpleural
nodule seen in the right middle lobe previously measuring up to 9 mm
have essentially resolved with only a wispy 4-5 mm ground-glass
opacity at the location of the 9 mm nodule (see image 85/series 8
today). The nodular and consolidative opacities seen posteriorly in
the right lower lobe previously have resolved in some areas and the
dominant 2.9 cm nodular component measured previously is now 1.2 cm
and is no longer confluent. Similar scattered nodules in the left
lung have also decreased in the interval. 3 mm ground-glass opacity
in the posterior left lower lobe on image 82/8 today has decreased
from 7 mm nodular opacity previously. No new suspicious pulmonary
nodule or mass. There is no evidence of pleural effusion.

Upper Abdomen: The liver shows diffusely decreased attenuation
suggesting fat deposition.

Musculoskeletal: No worrisome lytic or sclerotic osseous
abnormality.
IMPRESSION: 1. Marked interval decrease in dominant posterior right lower lobe
consolidative opacity. Numerous other nodular opacity seen
distributed peripherally in both lungs previously show similar
marked decrease and have resolved in some locations. Repeat CT in 3
months could be used to assess for further improvement as clinically
warranted.
2. Hepatic steatosis.
3. Aortic Atherosclerosis ([id number]).

## 2022-02-19 DIAGNOSIS — Z961 Presence of intraocular lens: Secondary | ICD-10-CM | POA: Diagnosis not present

## 2022-03-01 ENCOUNTER — Ambulatory Visit: Payer: Medicare Other | Attending: Cardiology | Admitting: Cardiology

## 2022-03-01 ENCOUNTER — Encounter: Payer: Self-pay | Admitting: Cardiology

## 2022-03-01 VITALS — BP 120/56 | HR 57 | Ht 66.0 in | Wt 212.6 lb

## 2022-03-01 DIAGNOSIS — R600 Localized edema: Secondary | ICD-10-CM

## 2022-03-01 DIAGNOSIS — I2583 Coronary atherosclerosis due to lipid rich plaque: Secondary | ICD-10-CM | POA: Diagnosis not present

## 2022-03-01 DIAGNOSIS — I2510 Atherosclerotic heart disease of native coronary artery without angina pectoris: Secondary | ICD-10-CM

## 2022-03-01 DIAGNOSIS — E785 Hyperlipidemia, unspecified: Secondary | ICD-10-CM

## 2022-03-01 DIAGNOSIS — I25118 Atherosclerotic heart disease of native coronary artery with other forms of angina pectoris: Secondary | ICD-10-CM | POA: Diagnosis not present

## 2022-03-01 DIAGNOSIS — I10 Essential (primary) hypertension: Secondary | ICD-10-CM | POA: Diagnosis not present

## 2022-03-01 NOTE — Progress Notes (Signed)
Cardiology Office Note    Date:  03/01/2022   ID:  JAYAN Holden, DOB 1945/10/09, MRN [id number]  PCP:  Gladstone Lighter, MD  Cardiologist:  Fransico Him, MD   Chief Complaint  Patient presents with   Coronary Artery Disease   Hypertension   Hyperlipidemia    History of Present Illness:  Daniel Holden is a 76 y.o. male with a hx of anxiety, HTN , HLD and CAD with coronary CTA showing aortic atherosclerosis with very high coronary Ca score at 4630 with severely calcified RCA and LAD > 70% and 50-69% prox to mid LCx.  Cardiac cath 10/04/2020 showed severe single vessel CAD with occluded RCA with extensive calcifications and multiple different lesions including 90% proxm and distal lesions, 50% oLCx and 50-60% dLCx and 40% pLAD with heavily calcified vessels with bridging collaterals from the septal perforator and diagonal to the PDA. Medical management was recommended.  He was started on Ranexa and Imdur.  At last office visit with me he complained of chest pain and shortness of breath.  He had stopped his Imdur previously because of headaches but then was found that it was due to a sinus infection and not from Imdur.  At that office visit Imdur 30 mg was restarted.  He was then seen back in our office in May and continued chest pain and shortness of breath but was felt to have improved after going back on Imdur.    He was then seen back in July due to lower extremity edema while on vacation but seemed of gotten worse and restarted HCTZ with no improvement.  At that office visit HCTZ was stopped and he was started on Lasix 20 mg daily.  He had stable angina at that office visit as well.  Unfortunately unable to increase Imdur from 30 mg to 60 mg due to hypotension at higher doses of Imdur in the past.  2D echo in July showed normal LV function with EF 55 to 60% and grade 1 diastolic dysfunction, mild MR, moderate left atrial enlargement, mild AI.  He is here today for followup and is  doing well.  He denies any chest pain or pressure, PND, orthopnea, dizziness, palpitations or syncope. He has been walking every morning and has not had any problems except for some mild SOB at times which is normal for him. He does still have some mild LE edema at his ankles when walking outside.  He is wearing compression hose. He does admit to salting his food some. He is compliant with his meds and is tolerating meds with no SE.     Past Medical History:  Diagnosis Date   Allergy    Angina of effort ([place])    Anxiety    CAD (coronary artery disease), native coronary artery    coronary CTA showing aortic atherosclerosis with very high coronary Ca score at 4630 with severely calcified RCA and LAD > 70% and 50-69% prox to mid LCx.     Cancer (Daniel Holden)    Carpal tunnel syndrome    GERD (gastroesophageal reflux disease)    HLD (hyperlipidemia)    Hypertension    Lung nodule    Obesity    Pancreatitis 2015   Sleep apnea     Past Surgical History:  Procedure Laterality Date   CARPAL TUNNEL RELEASE     COLONOSCOPY WITH PROPOFOL N/A 06/27/2021   Procedure: COLONOSCOPY WITH PROPOFOL;  Surgeon: [name], MD;  Location: [facility] END[facility];  Service:  Endoscopy;  Laterality: N/A;   ESOPHAGOGASTRODUODENOSCOPY (EGD) WITH PROPOFOL N/A 06/27/2021   Procedure: ESOPHAGOGASTRODUODENOSCOPY (EGD) WITH PROPOFOL;  Surgeon: [name], MD;  Location: [facility] ENDOSCOPY;  Service: Endoscopy;  Laterality: N/A;   ETHMOIDECTOMY Right 03/20/2021   Procedure: ANTERIOR ETHMOIDECTOMY;  Surgeon: Clyde Canterbury, MD;  Location: [facility] ORS;  Service: ENT;  Laterality: Right;   IMAGE GUIDED SINUS SURGERY N/A 03/20/2021   Procedure: IMAGE GUIDED SINUS SURGERY;  Surgeon: Clyde Canterbury, MD;  Location: [facility] ORS;  Service: ENT;  Laterality: N/A;   KNEE ARTHROSCOPY WITH MEDIAL MENISECTOMY Left 06/25/2019   Procedure: KNEE ARTHROSCOPY WITH MEDIAL MENISECTOMY;  Surgeon: Thornton Park, MD;  Location: [facility] ORS;  Service:  Orthopedics;  Laterality: Left;   LEFT HEART CATH AND CORONARY ANGIOGRAPHY N/A 10/04/2020   Procedure: LEFT HEART CATH AND CORONARY ANGIOGRAPHY;  Surgeon: Leonie Man, MD;  Location: [facility];  Service: Cardiovascular;  Laterality: N/A;   MAXILLARY ANTROSTOMY Right 03/20/2021   Procedure: MAXILLARY ANTROSTOMY WITH TISSUE REMOVAL;  Surgeon: Clyde Canterbury, MD;  Location: [facility];  Service: ENT;  Laterality: Right;   SHOULDER SURGERY     TONSILLECTOMY      Current Medications: Current Meds  Medication Sig   ALPRAZolam (XANAX) 0.25 MG tablet Take 1 tablet (0.25 mg total) by mouth at bedtime as needed for sleep.   amLODipine (NORVASC) 5 MG tablet Take 1 tablet (5 mg total) by mouth daily.   aspirin 81 MG EC tablet Take 81 mg by mouth daily.   atorvastatin (LIPITOR) 80 MG tablet Take 1 tablet (80 mg total) by mouth daily.   Cholecalciferol 250 MCG (10000 UT) TABS Take 1,000 Units by mouth daily.   cyanocobalamin 1000 MCG tablet Take 1,000 mcg by mouth daily.   fluticasone (FLONASE) 50 MCG/ACT nasal spray Place 2 sprays into both nostrils daily.    furosemide (LASIX) 20 MG tablet Take 1 tablet (20 mg total) by mouth daily.   isosorbide mononitrate (IMDUR) 30 MG 24 hr tablet Take 1 tablet (30 mg total) by mouth daily.   losartan (COZAAR) 50 MG tablet Take 1 tablet (50 mg total) by mouth daily.   melatonin 3 MG TABS tablet Take 10 mg by mouth at bedtime.   metoprolol tartrate (LOPRESSOR) 50 MG tablet TAKE 1 TABLET TWICE A DAY   naproxen sodium (ALEVE) 220 MG tablet Take 1 tablet (220 mg total) by mouth 2 (two) times daily as needed.   omeprazole (PRILOSEC OTC) 20 MG tablet Take 10 mg by mouth daily.   ranolazine (RANEXA) 1000 MG SR tablet Take 1 tablet (1,000 mg total) by mouth 2 (two) times daily.    Allergies:   Metronidazole, Ace inhibitors, and Dilaudid [hydromorphone hcl]   Social History   Socioeconomic History   Marital status: Married    Spouse name: Not on file    Number of children: Not on file   Years of education: Not on file   Highest education level: Not on file  Occupational History   Not on file  Tobacco Use   Smoking status: Never   Smokeless tobacco: Never  Vaping Use   Vaping Use: Never used  Substance and Sexual Activity   Alcohol use: Yes    Alcohol/week: 2.0 standard drinks of alcohol    Types: 2 Cans of beer per week    Comment: 6 per day   Drug use: No   Sexual activity: Not on file  Other Topics Concern   Not on file  Social History Narrative   Not on file   Social Determinants of Health   Financial Resource Strain: Not on file  Food Insecurity: Not on file  Transportation Needs: Not on file  Physical Activity: Not on file  Stress: Not on file  Social Connections: Not on file     Family History:  The patient's family history includes CVA in his father.   ROS:   Please see the history of present illness.    Review of Systems  Musculoskeletal:  Negative for muscle weakness.   All other systems reviewed and are negative.      No data to display             PHYSICAL EXAM:   VS:  BP (!) 120/56   Pulse (!) 57   Ht '5\' 6"'$  (1.676 m)   Wt 212 lb 9.6 oz (96.4 kg)   SpO2 96%   BMI 34.31 kg/m    GEN: Well nourished, well developed in no acute distress HEENT: Normal NECK: No JVD; No carotid bruits LYMPHATICS: No lymphadenopathy CARDIAC:RRR, no murmurs, rubs, gallops RESPIRATORY:  Clear to auscultation without rales, wheezing or rhonchi  ABDOMEN: Soft, non-tender, non-distended MUSCULOSKELETAL:  No edema; No deformity  SKIN: Warm and dry NEUROLOGIC:  Alert and oriented x 3 PSYCHIATRIC:  Normal affect  Wt Readings from Last 3 Encounters:  03/01/22 212 lb 9.6 oz (96.4 kg)  12/11/21 212 lb 3.2 oz (96.3 kg)  11/16/21 212 lb 12.8 oz (96.5 kg)      Studies/Labs Reviewed:   EKG:  EKG is not ordered today   Recent Labs: 03/19/2021: ALT 7 03/22/2021: Hemoglobin 11.0; Platelets 267 01/11/2022: BUN 16;  Creatinine, Ser 0.81; Potassium 3.7; Sodium 132   Lipid Panel    Component Value Date/Time   CHOL 104 11/07/2020 0741   CHOL 83 09/27/2013 0319   TRIG 111 11/07/2020 0741   TRIG 188 09/27/2013 0319   HDL 58 11/07/2020 0741   HDL 28 (L) 09/27/2013 0319   CHOLHDL 1.8 11/07/2020 0741   VLDL 38 09/27/2013 0319   LDLCALC 26 11/07/2020 0741   LDLCALC 17 09/27/2013 0319    Additional studies/ records that were reviewed today include:  Cardiac cath 10/04/2020 Diagnostic Dominance: Right       ASSESSMENT:    1. Coronary artery disease of native artery of native heart with stable angina pectoris ([place])   2. Essential hypertension   3. Hyperlipidemia LDL goal <70   4. Bilateral lower extremity edema      PLAN:  In order of problems listed above:  1.  ASCAD with chronic stable angina -coronary CTA showing aortic atherosclerosis with very high coronary Ca score at 4630 with severely calcified RCA and LAD > 70% and 50-69% prox to mid LCx.   -Cardiac cath 10/04/2020 showed severe single vessel CAD with occluded RCA with extensive calcifications and multiple different lesions including 90% prox and distal lesions, 50% oLCx and 50-60% dLCx and 40% pLAD with heavily calcified vessels with bridging collaterals from the septal perforator and diagonal to the PDA.  -Medical management was recommended  -angina is fairly well controlled on medical therapy but he still gets some mild CP and SOB when walking up inclines but as long as he is on flat ground he is fine -He stopped long-acting nitrates due to headaches but then found out that they were from sinus infection and was restarted on his Imdur 30 mg daily -His angina is VERY stable with no further  CP and takes a walk daily in the am -Continue prescription drug management with aspirin 81 mg daily, amlodipine 5 mg daily, Imdur 30 mg daily, Lopressor 50 mg twice daily, Ranexa 1000 mg twice daily and statin therapy with as needed refills  2.   HTN -BP is doubly controlled on exam today -Continue prescription drug management with amlodipine 5 mg daily, Imdur 30 mg daily, losartan 50 mg daily, Lopressor 50 mg twice daily with as needed refills -I have personally reviewed and interpreted outside labs performed by patient's PCP which showed serum creatinine 0.81 and potassium 3.7 on 01/11/2022  3.  HLD -LDL goal < 70 -Continue prescription drug management with toward statin 80 mg daily with as needed refills -I have personally reviewed and interpreted outside labs performed by patient's PCP which showed LDL 26 and HDL 58 on 11/07/2020  4.  Chronic lower extremity edema -He recently had a flare of this and his HCT was stopped and he is now on Lasix 20 mg daily -He does not have any significant edema on exam today -Continue prescription drug management Lasix 20 mg daily with as needed refills  Medication Adjustments/Labs and Tests Ordered: Current medicines are reviewed at length with the patient today.  Concerns regarding medicines are outlined above.  Medication changes, Labs and Tests ordered today are listed in the Patient Instructions below.  Followup with me in 6 months   Signed, Fransico Him, MD  03/01/2022 10:16 AM    [facility] [address], [place], [place]  [address] Phone: [phone]; Fax: [phone]

## 2022-03-01 NOTE — Patient Instructions (Signed)
Medication Instructions:  Your physician recommends that you continue on your current medications as directed. Please refer to the Current Medication list given to you today.  *If you need a refill on your cardiac medications before your next appointment, please call your pharmacy*  Follow-Up: At [facility], you and your health needs are our priority.  As part of our continuing mission to provide you with exceptional heart care, we have created designated Provider Care Teams.  These Care Teams include your primary Cardiologist (physician) and Advanced Practice Providers (APPs -  Physician Assistants and Nurse Practitioners) who all work together to provide you with the care you need, when you need it.  Your next appointment:   1 year(s)  The format for your next appointment:   In Person  Provider:   Traci Turner, MD     Important Information About Sugar       

## 2022-03-31 ENCOUNTER — Encounter: Payer: Self-pay | Admitting: Cardiology

## 2022-03-31 ENCOUNTER — Other Ambulatory Visit: Payer: Self-pay | Admitting: Cardiology

## 2022-04-06 ENCOUNTER — Other Ambulatory Visit: Payer: Self-pay | Admitting: Cardiology

## 2022-05-25 ENCOUNTER — Other Ambulatory Visit: Payer: Self-pay | Admitting: Nurse Practitioner

## 2022-05-29 DIAGNOSIS — D2261 Melanocytic nevi of right upper limb, including shoulder: Secondary | ICD-10-CM | POA: Diagnosis not present

## 2022-05-29 DIAGNOSIS — C44329 Squamous cell carcinoma of skin of other parts of face: Secondary | ICD-10-CM | POA: Diagnosis not present

## 2022-05-29 DIAGNOSIS — D0439 Carcinoma in situ of skin of other parts of face: Secondary | ICD-10-CM | POA: Diagnosis not present

## 2022-05-29 DIAGNOSIS — D2262 Melanocytic nevi of left upper limb, including shoulder: Secondary | ICD-10-CM | POA: Diagnosis not present

## 2022-05-29 DIAGNOSIS — D2272 Melanocytic nevi of left lower limb, including hip: Secondary | ICD-10-CM | POA: Diagnosis not present

## 2022-05-29 DIAGNOSIS — L570 Actinic keratosis: Secondary | ICD-10-CM | POA: Diagnosis not present

## 2022-05-29 DIAGNOSIS — X32XXXA Exposure to sunlight, initial encounter: Secondary | ICD-10-CM | POA: Diagnosis not present

## 2022-05-29 DIAGNOSIS — Z85828 Personal history of other malignant neoplasm of skin: Secondary | ICD-10-CM | POA: Diagnosis not present

## 2022-05-29 DIAGNOSIS — D485 Neoplasm of uncertain behavior of skin: Secondary | ICD-10-CM | POA: Diagnosis not present

## 2022-06-06 ENCOUNTER — Encounter: Payer: Self-pay | Admitting: Cardiology

## 2022-06-12 ENCOUNTER — Other Ambulatory Visit: Payer: Self-pay | Admitting: Internal Medicine

## 2022-06-14 ENCOUNTER — Other Ambulatory Visit: Payer: Self-pay | Admitting: Cardiology

## 2022-06-20 MED ORDER — METOPROLOL TARTRATE 50 MG PO TABS: 50.0000 mg | ORAL_TABLET | Freq: Two times a day (BID) | ORAL | 3 refills | Status: DC

## 2022-06-20 NOTE — Addendum Note (Signed)
Addended by: Shellia Cleverly on: 06/20/2022 01:15 PM   Modules accepted: Orders

## 2022-07-03 ENCOUNTER — Other Ambulatory Visit: Payer: Self-pay

## 2022-07-03 MED ORDER — ISOSORBIDE MONONITRATE ER 30 MG PO TB24: 30.0000 mg | ORAL_TABLET | Freq: Every day | ORAL | 2 refills | Status: DC

## 2022-07-03 MED ORDER — FUROSEMIDE 20 MG PO TABS: 20.0000 mg | ORAL_TABLET | Freq: Every day | ORAL | 2 refills | Status: DC

## 2022-07-03 MED ORDER — LOSARTAN POTASSIUM 50 MG PO TABS: 50.0000 mg | ORAL_TABLET | Freq: Every day | ORAL | 2 refills | Status: DC

## 2022-07-03 MED ORDER — AMLODIPINE BESYLATE 5 MG PO TABS: 5.0000 mg | ORAL_TABLET | Freq: Every day | ORAL | 2 refills | Status: DC

## 2022-07-03 MED ORDER — ATORVASTATIN CALCIUM 80 MG PO TABS: 80.0000 mg | ORAL_TABLET | Freq: Every day | ORAL | 2 refills | Status: DC

## 2022-07-03 MED ORDER — RANOLAZINE ER 1000 MG PO TB12: 1000.0000 mg | ORAL_TABLET | Freq: Two times a day (BID) | ORAL | 2 refills | Status: DC

## 2022-07-19 DIAGNOSIS — C44329 Squamous cell carcinoma of skin of other parts of face: Secondary | ICD-10-CM | POA: Diagnosis not present

## 2022-07-19 DIAGNOSIS — D0439 Carcinoma in situ of skin of other parts of face: Secondary | ICD-10-CM | POA: Diagnosis not present

## 2022-07-30 DIAGNOSIS — R7309 Other abnormal glucose: Secondary | ICD-10-CM | POA: Diagnosis not present

## 2022-07-30 DIAGNOSIS — M1711 Unilateral primary osteoarthritis, right knee: Secondary | ICD-10-CM | POA: Diagnosis not present

## 2022-07-31 ENCOUNTER — Encounter: Payer: Self-pay | Admitting: Cardiology

## 2022-08-04 IMAGING — CT CT MAXILLOFACIAL W/O CM
3 series · 14 of 47 positions shown, 16 images · non-contrast
Comparison: Comparison made with previous head CT and orbital MRI
from 03/19/2021.

CLINICAL DATA: Initial evaluation for concern of invasive right
maxillary sinusitis with right orbital involvement.

EXAM:
CT MAXILLOFACIAL WITHOUT CONTRAST
TECHNIQUE: Multidetector CT imaging of the maxillofacial structures was
performed. Multiplanar CT image reconstructions were also generated.

[Series 2: standard · axial · 0.48mm/px · z∈[+234,+402]mm · 8 of 205 slices shown, 10 images]
[im 15/205  brain]
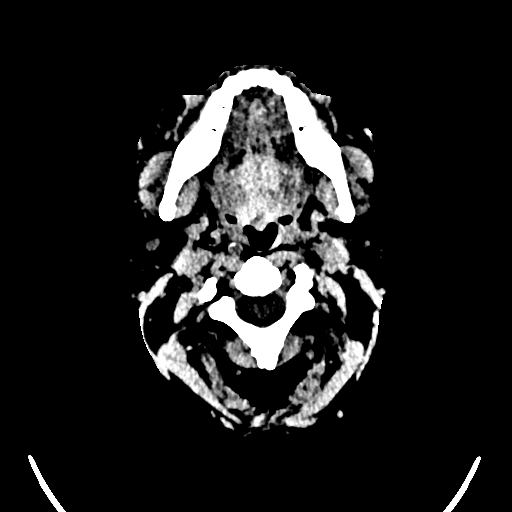
[im 15/205  bone]
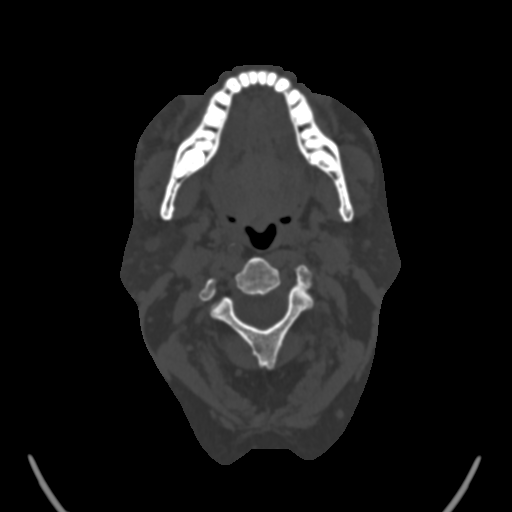
[im 43/205  bone]
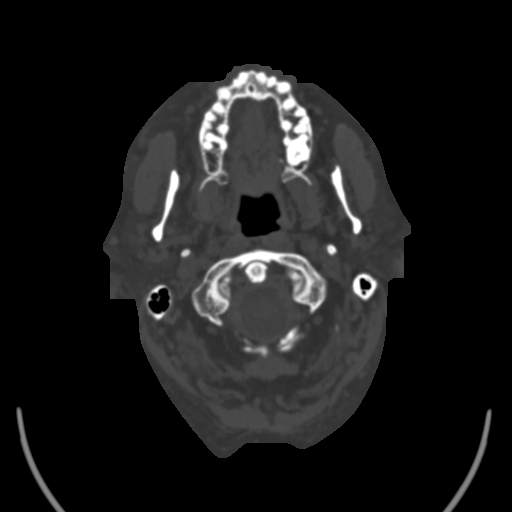
[im 64/205  bone]
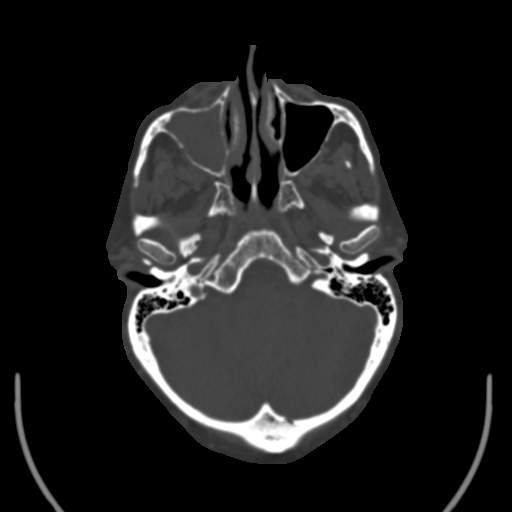
[im 85/205  bone]
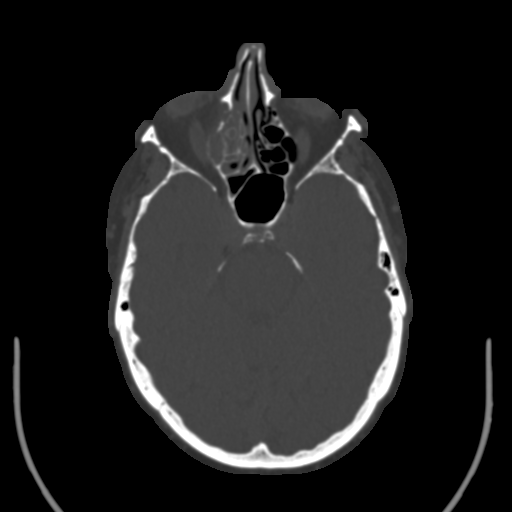
[im 113/205  brain]
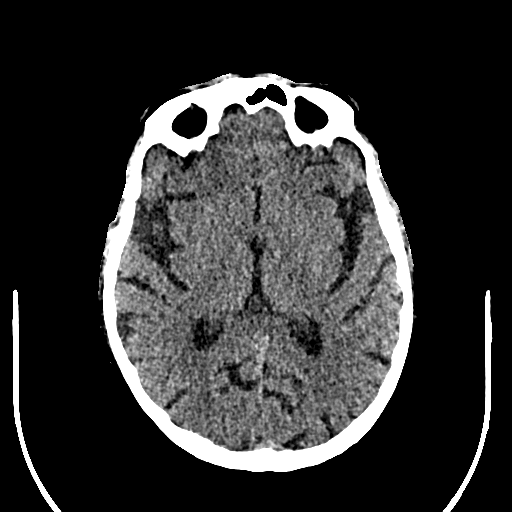
[im 113/205  bone]
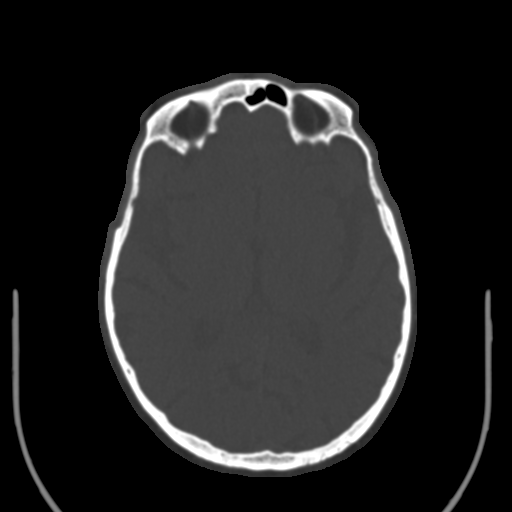
[im 134/205  bone]
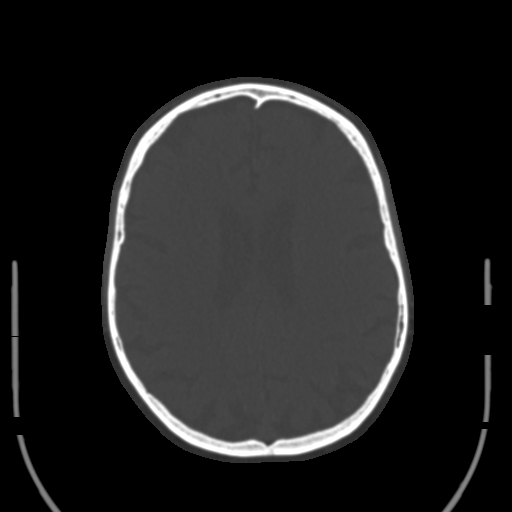
[im 155/205  bone]
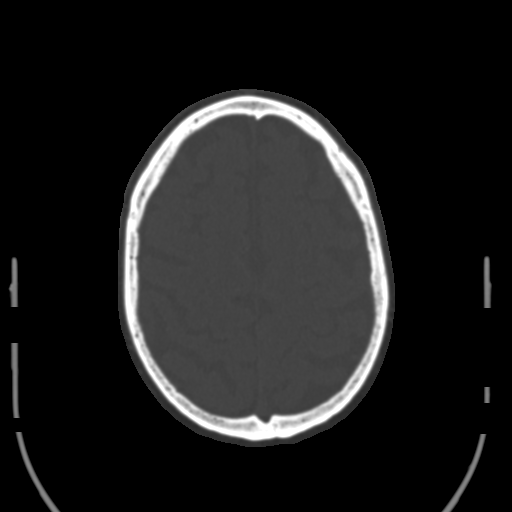
[im 183/205  bone]
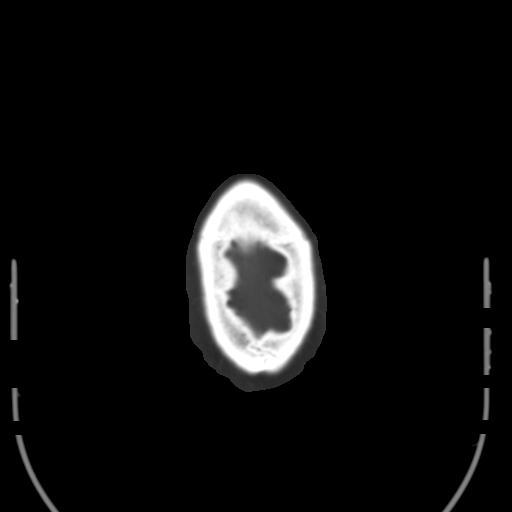

[Series 4: coronal · coronal · 0.39mm/px · 3 of 116 slices shown]
[im 39/116  bone]
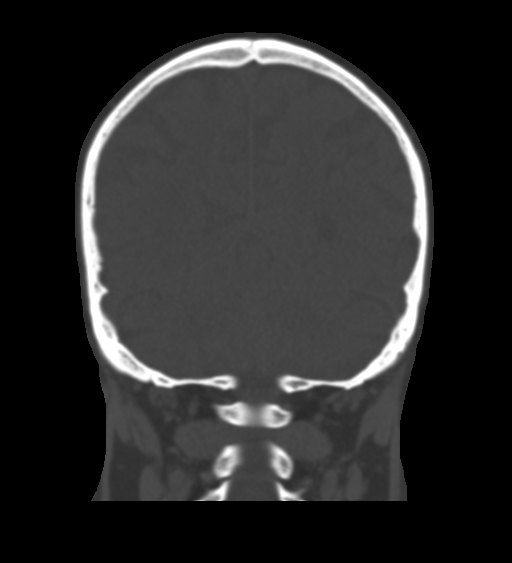
[im 52/116  bone]
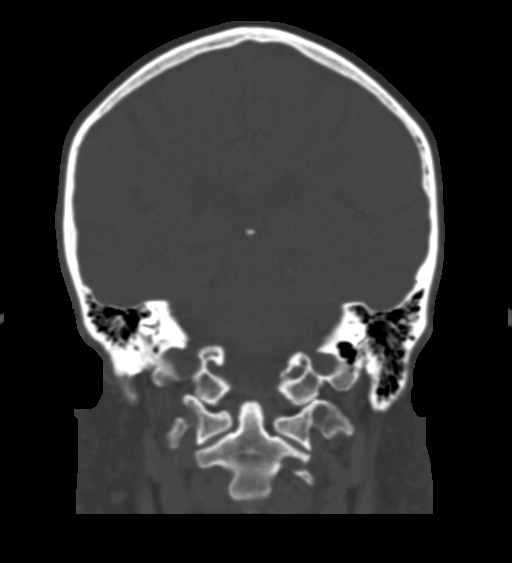
[im 64/116  bone]
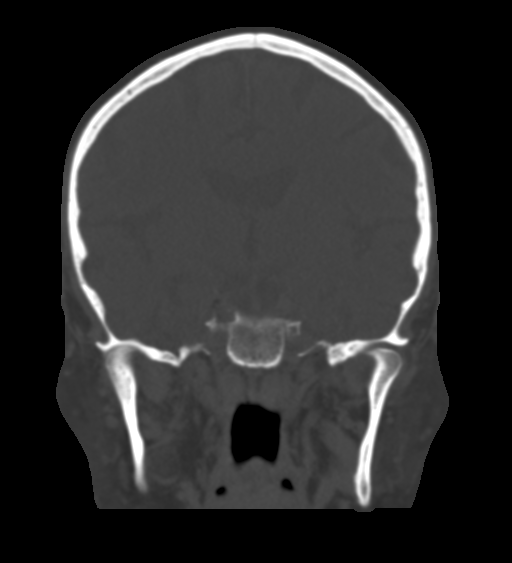

[Series 5: sagittal · sagittal · 0.42mm/px · 3 of 89 slices shown]
[im 30/89  bone]
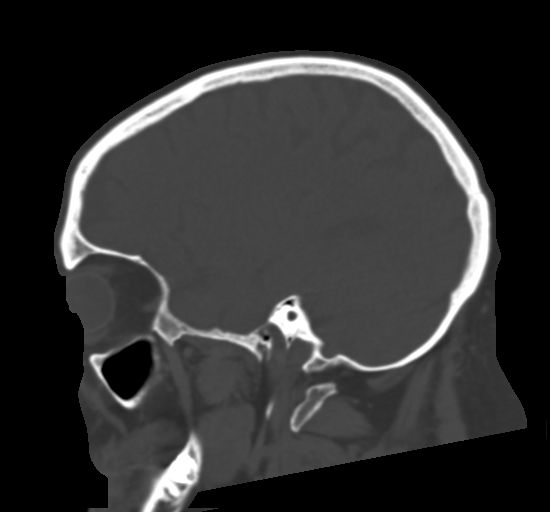
[im 45/89  bone]
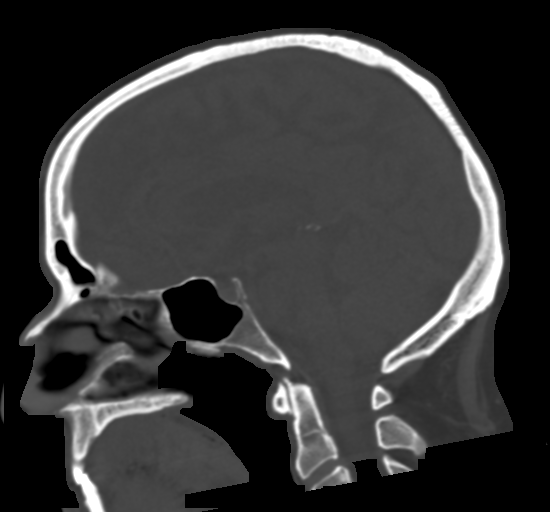
[im 59/89  bone]
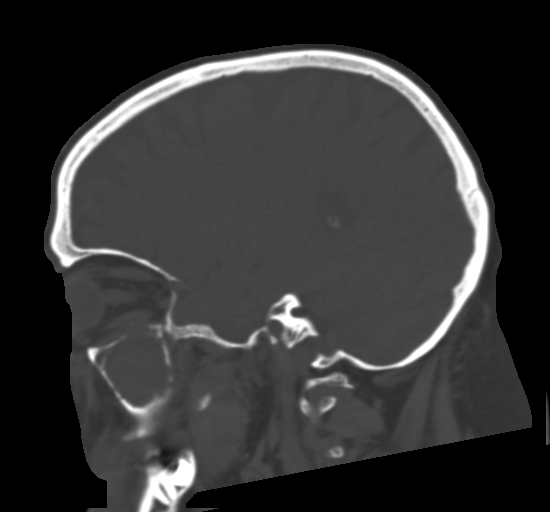

[14 of 47 positions shown; findings below may reference images not displayed]

FINDINGS: Orbits/Sinuses: Right maxillary sinus is completely opacified with
complex heterogeneous material. The right ethmoidal air cells are
also largely opacified. Opacification of the hypoplastic right
frontal sinus and frontoethmoidal recess. Mucosal thickening within
the right sphenoid sinus.

Thin section bone window algorithm was performed through the
sinuses. Images demonstrate multifocal areas of osseous thinning and
dehiscence about the right orbital floor (series 6, image 54),
confirming findings seen on prior studies. Additionally, the
anterior and posterior walls of the right maxillary sinus are also
thin with scattered areas of dehiscence (series 3, image 58). Hazy
inflammatory stranding seen within the right retro antral fat,
consistent with invasive sinusitis (series 2, image 53). The right
pre maxillary soft tissues are relatively preserved at this time.

Stranding with inflammatory changes seen involving the extraconal
fat of the inferior right orbit, again concerning for postseptal
cellulitis related to invasive sinusitis (series 5, image 30). Mild
mass effect on the adjacent right inferior rectus muscle which is
slightly bowed superiorly (series 5, image 31). Additionally, there
is possible thinning and dehiscence at the inferior right lamina
papyracea (series 6, image 43). The right lamina papyracea is
slightly bowed laterally into the bony right orbit due to underlying
sinus disease (series 2, image 92). No discrete abscess or drainable
fluid collection seen on this noncontrast CT. Remainder of the right
orbital fat is relatively maintained at this time. Mild right-sided
proptosis again noted. Right globe and optic nerve otherwise
unremarkable. Extra-ocular muscles otherwise unremarkable. Lacrimal
gland normal. No other abnormality about the right lacrimal
apparatus.

Contralateral left globe and orbit within normal limits.

Other than mild mucosal thickening about the left maxillary sinus,
the remainder of the left-sided paranasal sinuses are otherwise
relatively clear without abnormality. Mastoid air cells and middle
ear cavities are well pneumatized and free of fluid.

Soft tissues: Probable mild soft tissue swelling about the right
periorbital soft tissues, suggesting possible mild periorbital
cellulitis. No discrete soft tissue abscess or collection.

Limited intracranial: Unremarkable. No visible evidence for
intracranial spread of infection at this time.
IMPRESSION: 1. Extensive right-sided paranasal sinusitis, with evidence for
acute invasive right maxillary sinusitis, possibly invasive fungal
rhinosinusitis (AIFRS), confirming findings on previous MRI.
Associated thinning and dehiscence at the right orbital floor with
inflammatory changes and phlegmon involving the inferior postseptal
bony right orbit. Right lamina papyracea thinned and possibly
dehisced as well. No discrete intraorbital fluid collection or
abscess. Associated mild right-sided proptosis. Additional thinning
and dehiscence elsewhere about the right maxillary sinus, with
evidence for invasion into the right retro antral fat.
2. Mild soft tissue swelling about the right periorbital soft
tissues, suggesting associated periorbital cellulitis.

Critical results discussed by telephone at the time of
interpretation on 03/19/2021 at [DATE] to provider KACNI RITU , who
verbally acknowledged these results.

Per Dr. Aujla, emergent ENT consultation has already been obtained.

## 2022-08-04 IMAGING — CT CT HEAD W/O CM
4 series · 16 of 47 positions shown, 18 images · non-contrast
Comparison: None.

CLINICAL DATA: headache

EXAM:
CT HEAD WITHOUT CONTRAST
TECHNIQUE: Contiguous axial images were obtained from the base of the skull
through the vertex without intravenous contrast.

[Series 2: head wo · axial · 0.45mm/px · z∈[+294,+419]mm · 7 of 35 slices shown, 9 images]
[im 5/35  brain]
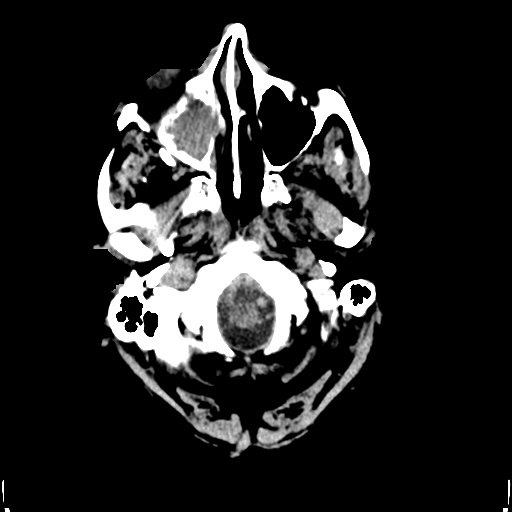
[im 5/35  bone]
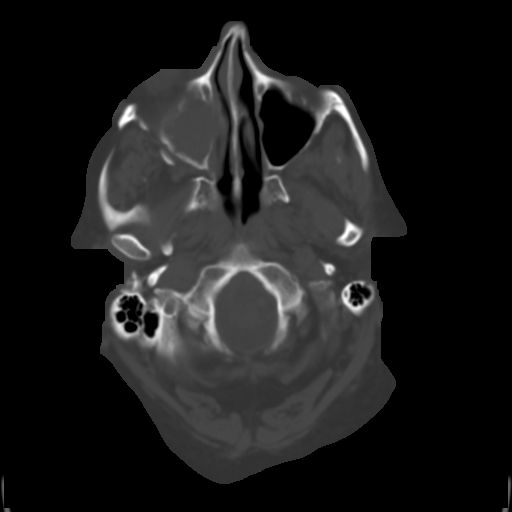
[im 9/35  brain]
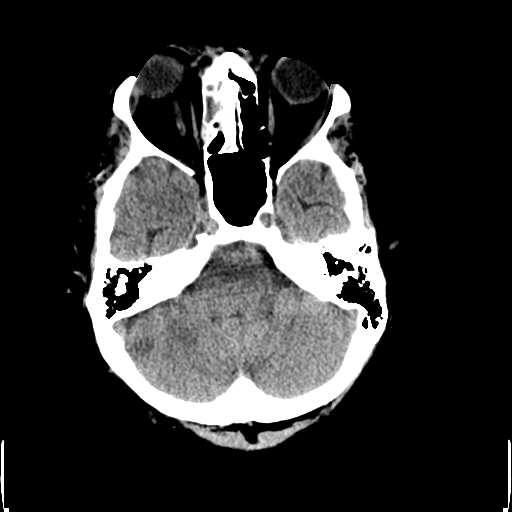
[im 13/35  brain]
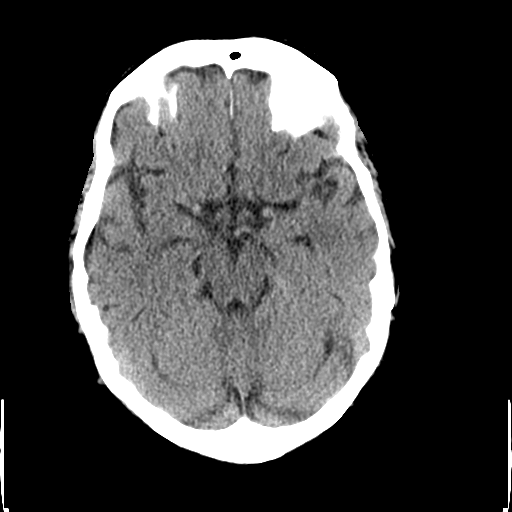
[im 18/35  brain]
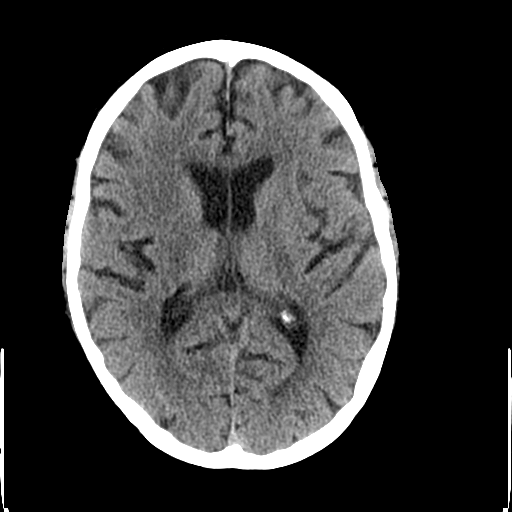
[im 22/35  brain]
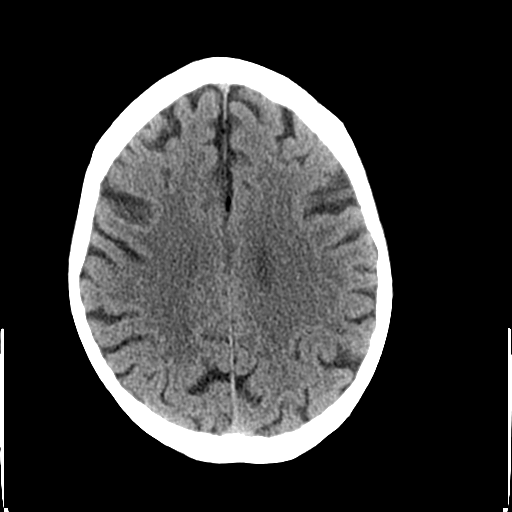
[im 22/35  bone]
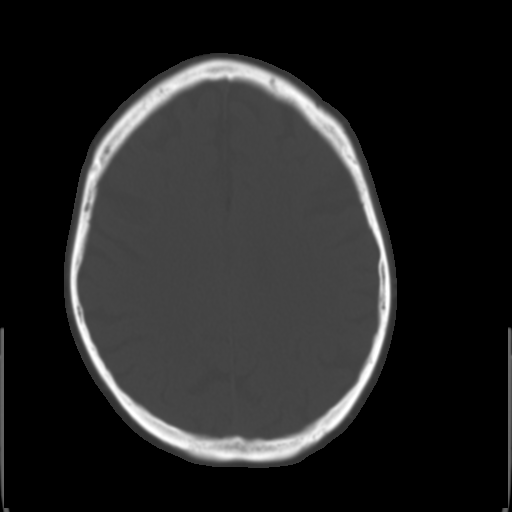
[im 26/35  brain]
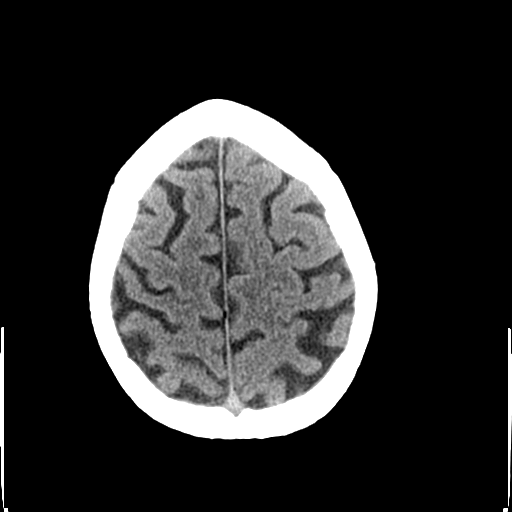
[im 30/35  brain]
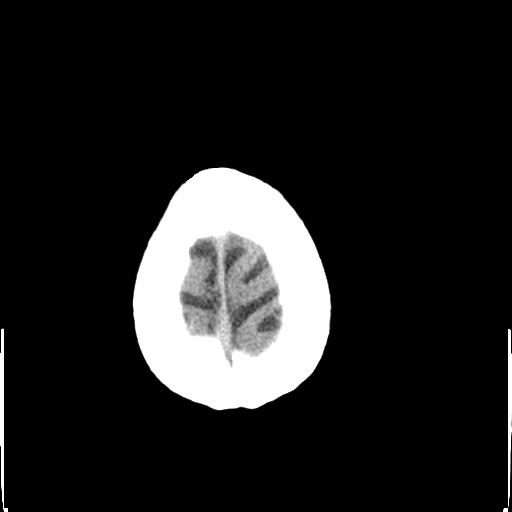

[Series 3: head bone · axial · 0.45mm/px · z∈[+290,+326]mm · 3 of 88 slices shown]
[im 9/88  bone]
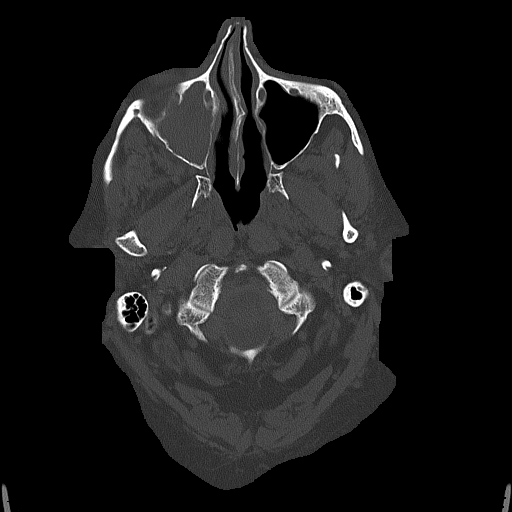
[im 18/88  bone]
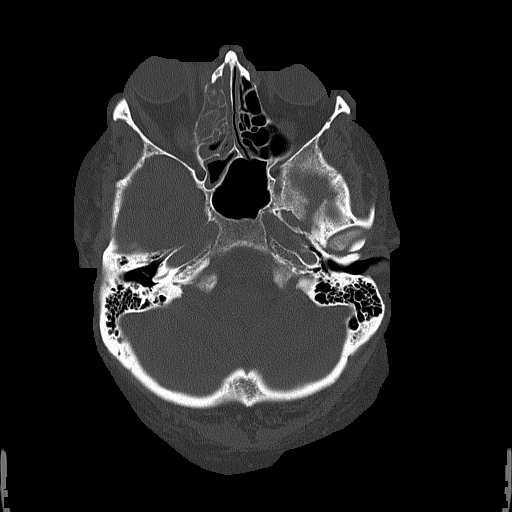
[im 27/88  bone]
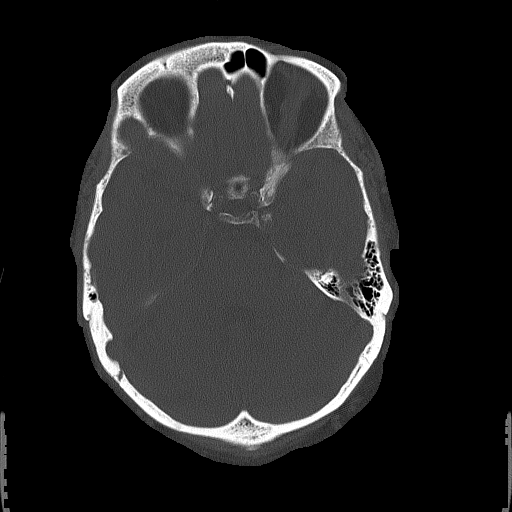

[Series 4: coronal soft tissue · coronal · 0.36mm/px · 3 of 74 slices shown]
[im 25/74  brain]
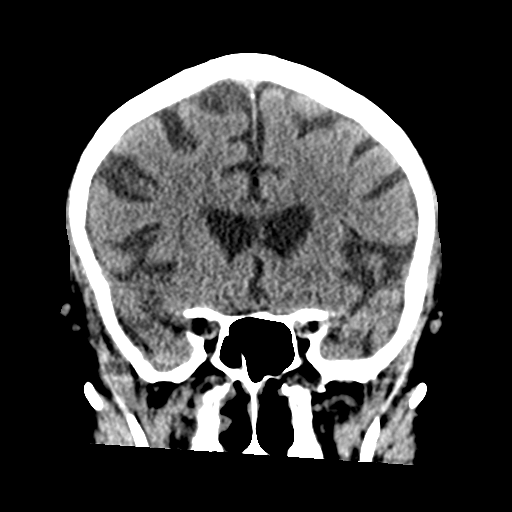
[im 33/74  brain]
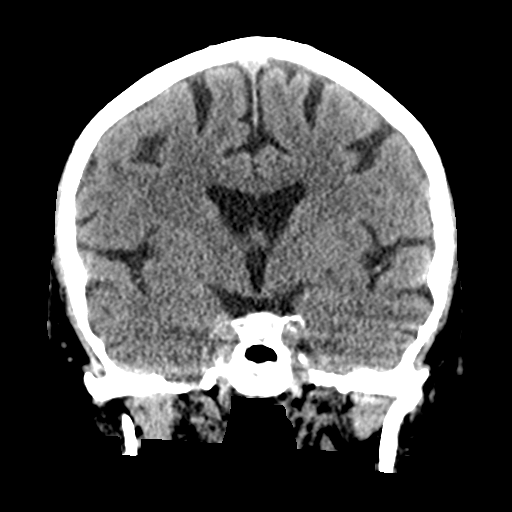
[im 41/74  brain]
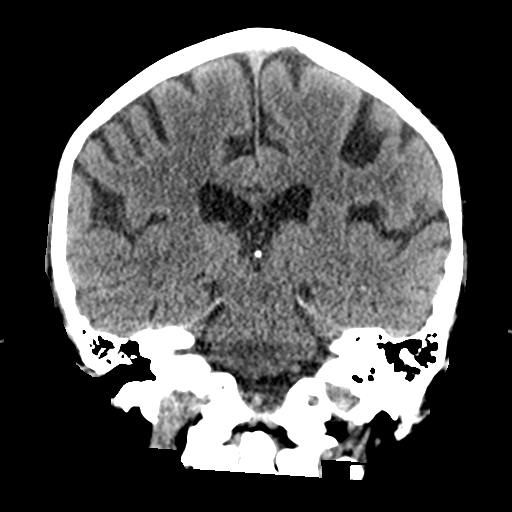

[Series 5: sagittal soft tissue · sagittal · 0.36mm/px · 3 of 61 slices shown]
[im 21/61  brain]
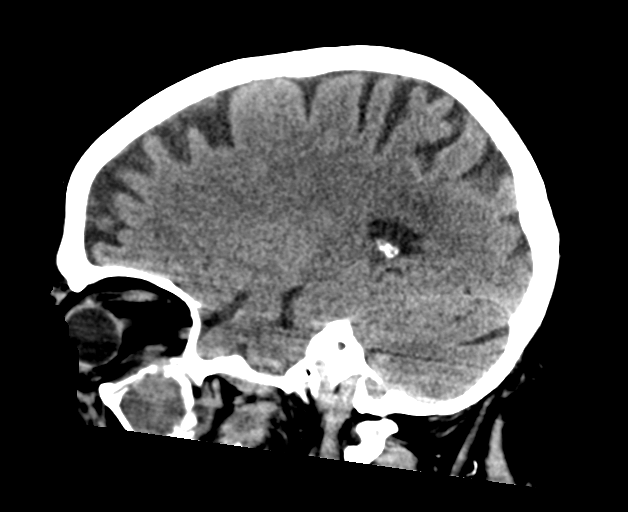
[im 31/61  brain]
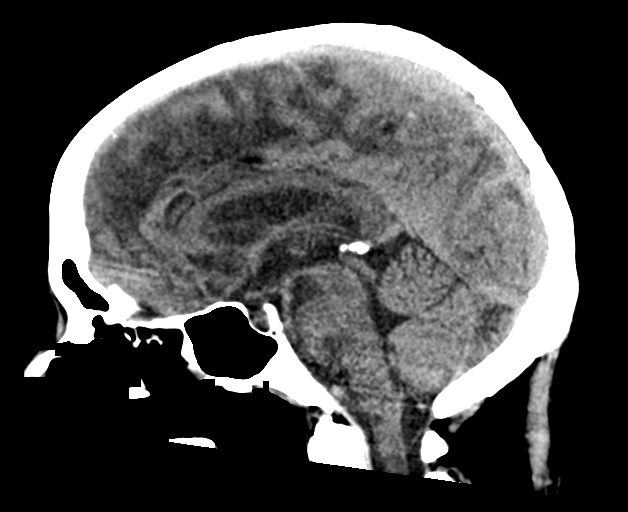
[im 41/61  brain]
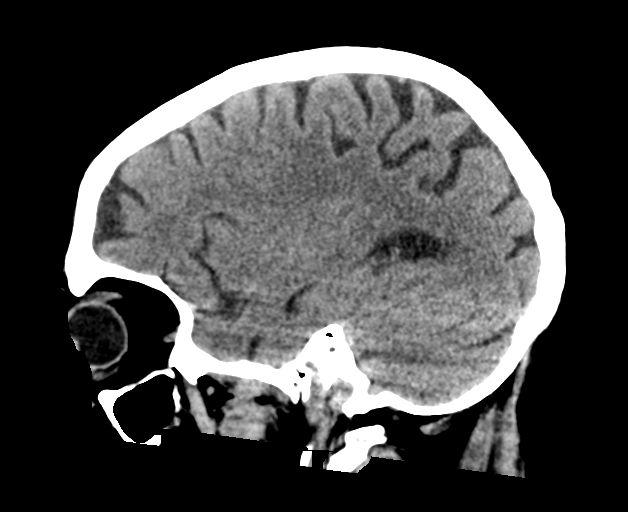

[16 of 47 positions shown; findings below may reference images not displayed]

FINDINGS: Brain: No evidence of acute infarction, hemorrhage, hydrocephalus,
extra-axial collection or mass lesion/mass effect.

Vascular: No hyperdense vessel or unexpected calcification.

Skull: Normal. Negative for fracture or focal lesion.

Sinuses/Orbits: There is complete opacification of the RIGHT
maxillary sinus in the majority of the RIGHT-sided ethmoid air cells
extending into the hypoplastic RIGHT frontal sinus. There is fat
stranding noted along the inferior orbital rim along the maxillary
sinus extending into the inferior orbit (series 4, image 16). No
definitive intracranial extension is seen, but evaluation is
limited. Mild mucosal thickening of the LEFT maxillary sinus.

Other: None
IMPRESSION: 1. There is complete opacification of the RIGHT maxillary sinus
extending into the RIGHT ethmoid air cells and the hypoplastic RIGHT
frontal sinus. There is asymmetric fat stranding noted in the
inferior RIGHT orbit. This could reflect a periosteal abscess. As
such, recommend further dedicated orbit evaluation with CT or MRI
with contrast.

## 2022-08-04 IMAGING — MR MR ORBITS WO/W CM
4 of 7 series · 23 of 48 positions shown · IV contrast (gadavist)
Comparison: Head CT 03/19/2021.

CLINICAL DATA: Pain in right eye. Additional history provided:
Patient reports headache on right side of head for the past 2-3
weeks, right-sided facial numbness, occasional blurry vision.

EXAM:
MRI OF THE ORBITS WITHOUT AND WITH CONTRAST
TECHNIQUE: Multiplanar, multi-echo pulse sequences of the orbits and
surrounding structures were acquired including fat saturation
techniques, before and after intravenous contrast administration.
CONTRAST:  7.5mL GADAVIST GADOBUTROL 1 MMOL/ML IV SOLN

[Series 5: T1 · sagittal · 3.0mm · 0.38mm/px · 8 of 40 slices shown (1 of 2)]
[im 1/40]
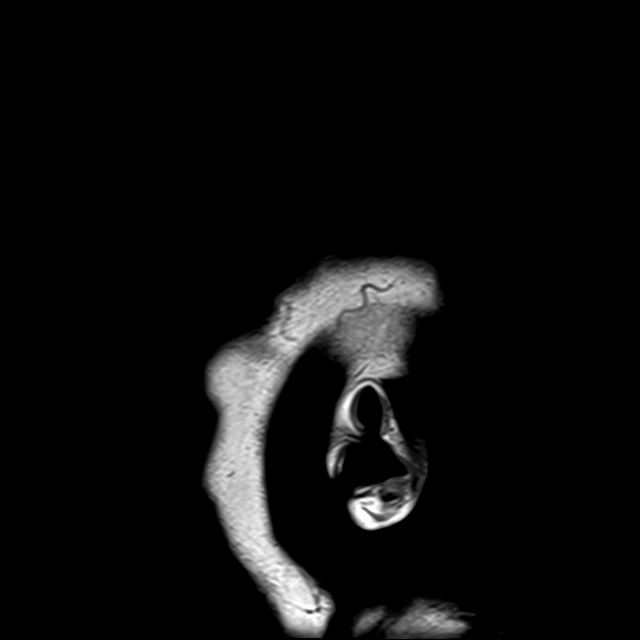
[im 6/40]
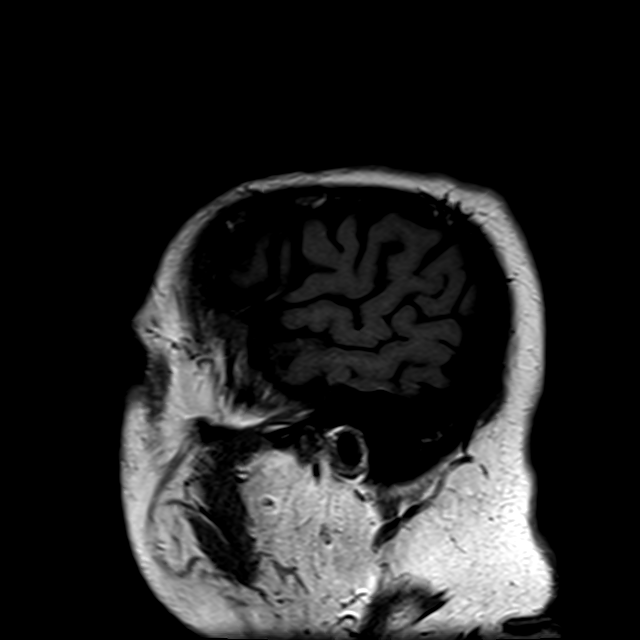
[im 12/40]
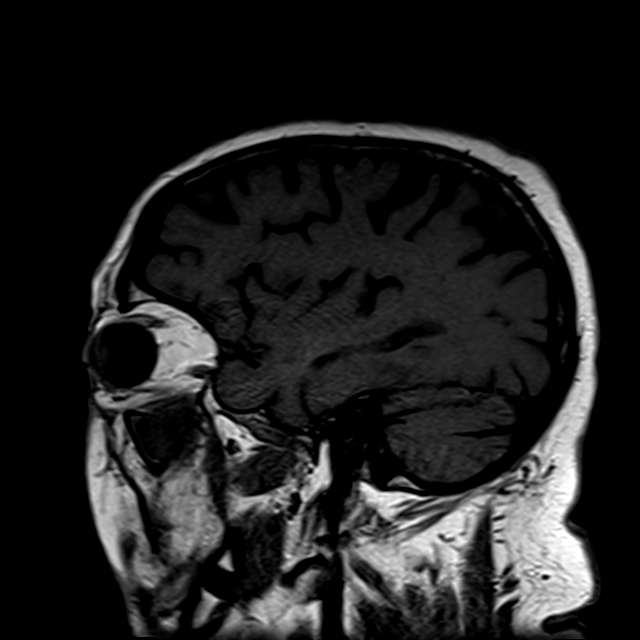
[im 17/40]
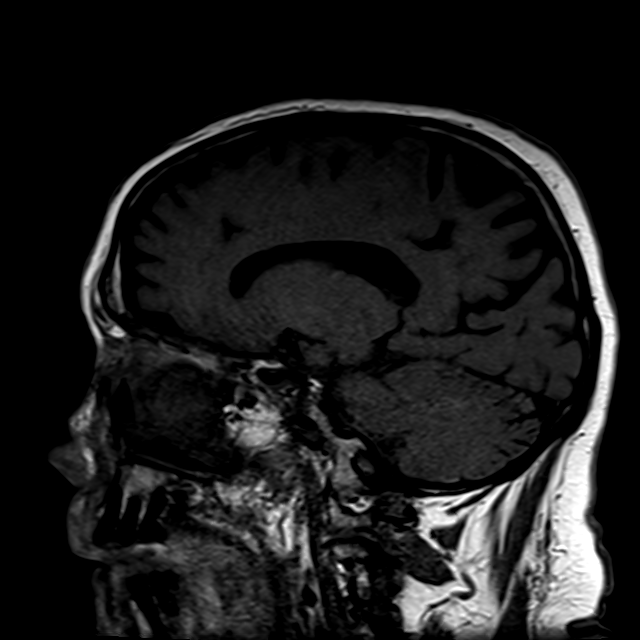
[im 23/40]
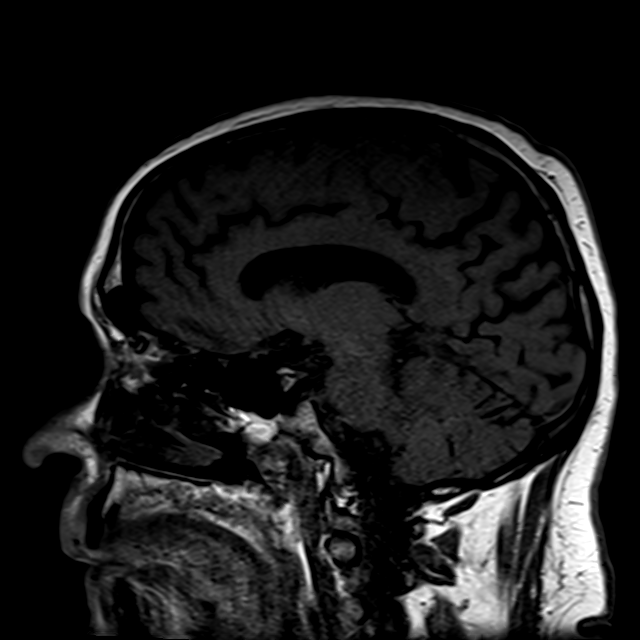
[im 28/40]
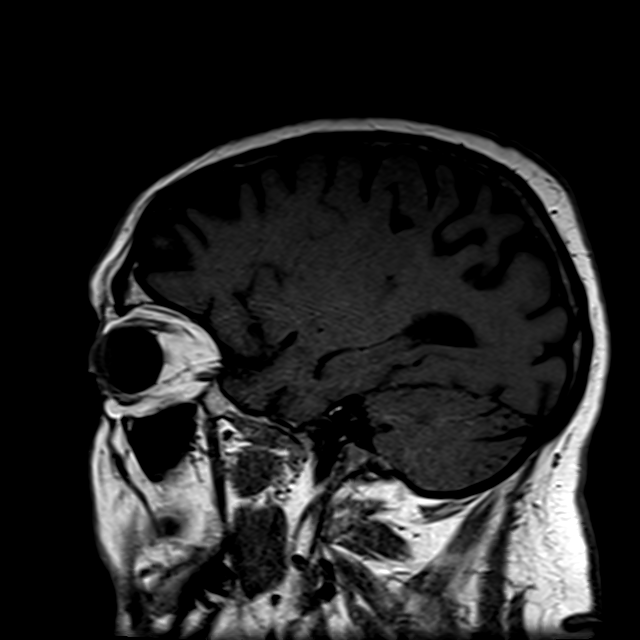
[im 34/40]
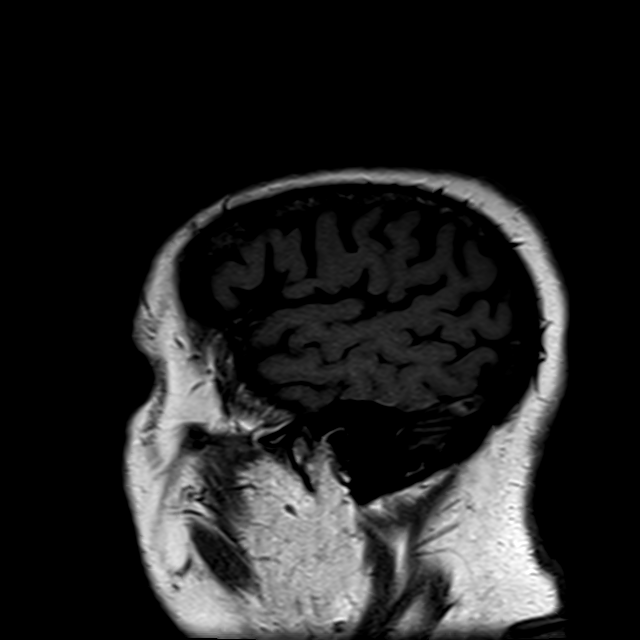
[im 40/40]
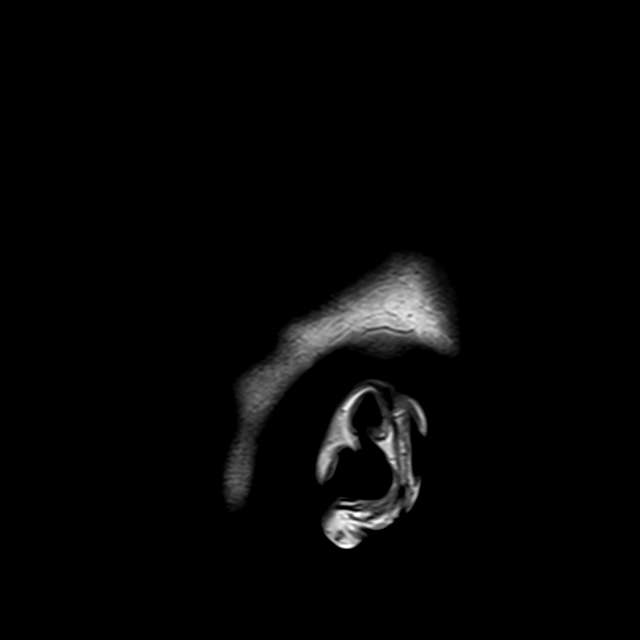

[Series 7: T2 · coronal · 3.0mm · 0.78mm/px · 8 of 35 slices shown (1 of 2)]
[im 1/35]
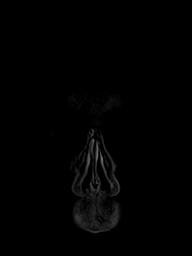
[im 5/35]
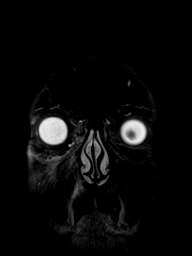
[im 10/35]
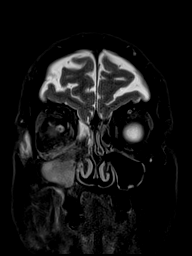
[im 15/35]
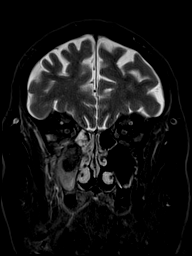
[im 20/35]
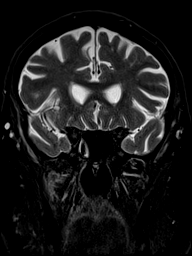
[im 25/35]
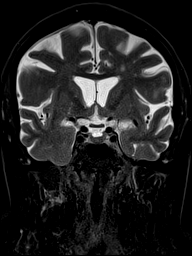
[im 30/35]
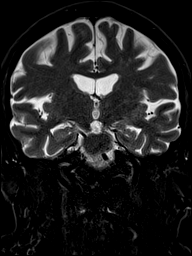
[im 35/35]
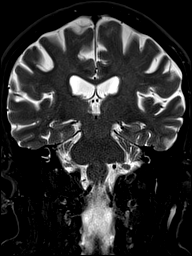

[Series 9: T2 · axial · 3.0mm · 0.78mm/px · z∈[-14,+49]mm · 4 of 17 slices shown (2 of 2)]
[im 1/17]
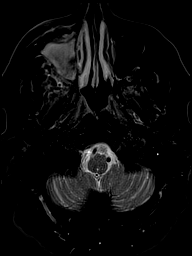
[im 6/17]
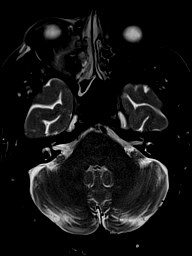
[im 11/17]
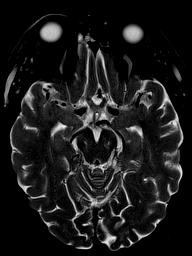
[im 17/17]
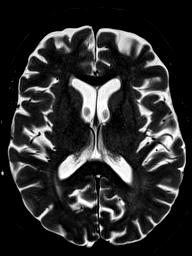

[Series 10: T1 · axial · non-contrast · 3.0mm · 0.31mm/px · z∈[-15,+51]mm · 3 of 23 slices shown (2 of 2)]
[im 1/23]
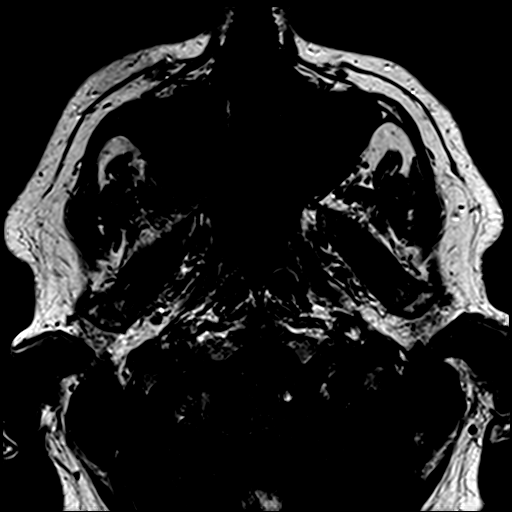
[im 12/23]
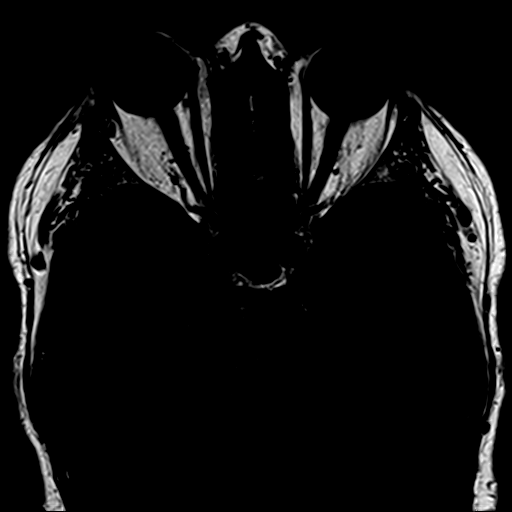
[im 23/23]
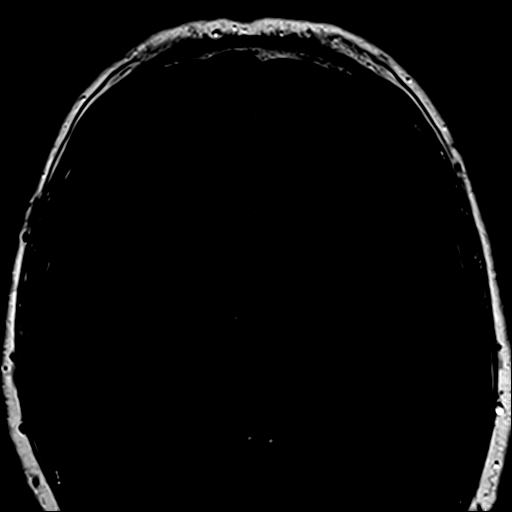

[23 of 48 positions shown; findings below may reference images not displayed]

FINDINGS: Orbits: Osseous thinning with foci of possible dehiscence within the
right orbital floor/roof of right maxillary sinus were better
appreciated on the head CT performed earlier today. There is
adjacent abnormal T2 hyperintense enhancing soft tissue within the
inferior extraconal right orbit measuring up to 5 mm in thickness
(for instance as seen on series 13, image 29) (series 7, image 26).
This is highly concerning for extension of infection from the right
maxillary sinus with resultant postseptal orbital
cellulitis/phlegmon at this site. This abnormal soft tissue abuts
the right inferior rectus muscle. The globes are normal in size and
contour. The extraocular muscles and optic nerve sheath complexes
are otherwise symmetric and unremarkable. Subtle right proptosis.

Visualized sinuses: The right maxillary sinus is completely
opacified with complex material. Osseous thinning with foci of
possible dehiscence in the right orbital floor/roof of right
maxillary sinus, better appreciated on the head CT performed earlier
today. Near complete T2 hyperintense opacification of the anterior
right ethmoid air cells. Trace mucosal thickening elsewhere within
the ethmoid sinuses, and within the visualized left maxillary sinus.

Soft tissues: T2 hyperintense signal abnormality and enhancement
within the right periorbital soft tissues.

Limited intracranial: No evidence of acute intracranial abnormality
within the field of view.

These results were called by telephone at the time of interpretation
on 03/19/2021 at [DATE] to provider Dr. Sisson, who verbally
acknowledged these results.
IMPRESSION: The right maxillary sinus is completely opacified with complex
material. Associated osseous thinning and possible foci of
dehiscence in the right orbital floor/roof of maxillary sinus,
better appreciated on the head CT performed earlier today. There is
adjacent abnormal enhancing soft tissue within the inferior
extraconal right orbit, measuring up to 5 mm in thickness. This is
favored to reflect extension of infection from the right maxillary
sinus with resultant right post-septal orbital cellulitis/phlegmon.
This abnormal enhancing soft tissue abuts the right inferior rectus
muscle. Subtle right proptosis.

Signal abnormality and enhancement within the right periorbital soft
tissues, likely reflecting a component of periorbital cellulitis.

Additional paranasal sinus disease, as described. Most notably,
there is severe partial opacification of the anterior right ethmoid
air cells (overall right ostiomeatal unit obstructive pattern).

ENT and ophthalmology consultation recommended.

## 2022-08-06 DIAGNOSIS — M1711 Unilateral primary osteoarthritis, right knee: Secondary | ICD-10-CM | POA: Diagnosis not present

## 2022-08-13 DIAGNOSIS — J329 Chronic sinusitis, unspecified: Secondary | ICD-10-CM | POA: Diagnosis not present

## 2022-08-13 DIAGNOSIS — H6123 Impacted cerumen, bilateral: Secondary | ICD-10-CM | POA: Diagnosis not present

## 2022-08-13 DIAGNOSIS — G4733 Obstructive sleep apnea (adult) (pediatric): Secondary | ICD-10-CM | POA: Diagnosis not present

## 2022-11-30 DIAGNOSIS — D2261 Melanocytic nevi of right upper limb, including shoulder: Secondary | ICD-10-CM | POA: Diagnosis not present

## 2022-11-30 DIAGNOSIS — X32XXXA Exposure to sunlight, initial encounter: Secondary | ICD-10-CM | POA: Diagnosis not present

## 2022-11-30 DIAGNOSIS — Z85828 Personal history of other malignant neoplasm of skin: Secondary | ICD-10-CM | POA: Diagnosis not present

## 2022-11-30 DIAGNOSIS — D2262 Melanocytic nevi of left upper limb, including shoulder: Secondary | ICD-10-CM | POA: Diagnosis not present

## 2022-11-30 DIAGNOSIS — L570 Actinic keratosis: Secondary | ICD-10-CM | POA: Diagnosis not present

## 2022-11-30 DIAGNOSIS — D2272 Melanocytic nevi of left lower limb, including hip: Secondary | ICD-10-CM | POA: Diagnosis not present

## 2022-11-30 DIAGNOSIS — D225 Melanocytic nevi of trunk: Secondary | ICD-10-CM | POA: Diagnosis not present

## 2022-11-30 DIAGNOSIS — Z08 Encounter for follow-up examination after completed treatment for malignant neoplasm: Secondary | ICD-10-CM | POA: Diagnosis not present

## 2022-12-05 DIAGNOSIS — R7303 Prediabetes: Secondary | ICD-10-CM | POA: Diagnosis not present

## 2022-12-05 DIAGNOSIS — D508 Other iron deficiency anemias: Secondary | ICD-10-CM | POA: Diagnosis not present

## 2022-12-05 DIAGNOSIS — Z79899 Other long term (current) drug therapy: Secondary | ICD-10-CM | POA: Diagnosis not present

## 2022-12-05 DIAGNOSIS — E538 Deficiency of other specified B group vitamins: Secondary | ICD-10-CM | POA: Diagnosis not present

## 2022-12-05 DIAGNOSIS — R899 Unspecified abnormal finding in specimens from other organs, systems and tissues: Secondary | ICD-10-CM | POA: Diagnosis not present

## 2022-12-19 DIAGNOSIS — E871 Hypo-osmolality and hyponatremia: Secondary | ICD-10-CM | POA: Diagnosis not present

## 2022-12-19 DIAGNOSIS — D508 Other iron deficiency anemias: Secondary | ICD-10-CM | POA: Diagnosis not present

## 2022-12-19 DIAGNOSIS — E538 Deficiency of other specified B group vitamins: Secondary | ICD-10-CM | POA: Diagnosis not present

## 2022-12-19 DIAGNOSIS — Z0000 Encounter for general adult medical examination without abnormal findings: Secondary | ICD-10-CM | POA: Diagnosis not present

## 2022-12-19 DIAGNOSIS — I25119 Atherosclerotic heart disease of native coronary artery with unspecified angina pectoris: Secondary | ICD-10-CM | POA: Diagnosis not present

## 2022-12-19 DIAGNOSIS — E119 Type 2 diabetes mellitus without complications: Secondary | ICD-10-CM | POA: Diagnosis not present

## 2023-02-06 DIAGNOSIS — J329 Chronic sinusitis, unspecified: Secondary | ICD-10-CM | POA: Diagnosis not present

## 2023-02-06 DIAGNOSIS — G4733 Obstructive sleep apnea (adult) (pediatric): Secondary | ICD-10-CM | POA: Diagnosis not present

## 2023-02-06 DIAGNOSIS — R1310 Dysphagia, unspecified: Secondary | ICD-10-CM | POA: Diagnosis not present

## 2023-02-07 ENCOUNTER — Encounter: Payer: Self-pay | Admitting: Cardiology

## 2023-02-20 DIAGNOSIS — M1711 Unilateral primary osteoarthritis, right knee: Secondary | ICD-10-CM | POA: Diagnosis not present

## 2023-02-25 DIAGNOSIS — Z23 Encounter for immunization: Secondary | ICD-10-CM | POA: Diagnosis not present

## 2023-02-27 DIAGNOSIS — M1711 Unilateral primary osteoarthritis, right knee: Secondary | ICD-10-CM | POA: Diagnosis not present

## 2023-03-05 ENCOUNTER — Ambulatory Visit: Payer: Medicare Other | Attending: Cardiology | Admitting: Cardiology

## 2023-03-05 DIAGNOSIS — I25118 Atherosclerotic heart disease of native coronary artery with other forms of angina pectoris: Secondary | ICD-10-CM | POA: Diagnosis not present

## 2023-03-05 DIAGNOSIS — E785 Hyperlipidemia, unspecified: Secondary | ICD-10-CM

## 2023-03-05 DIAGNOSIS — R600 Localized edema: Secondary | ICD-10-CM

## 2023-03-05 DIAGNOSIS — I10 Essential (primary) hypertension: Secondary | ICD-10-CM | POA: Diagnosis not present

## 2023-03-05 MED ORDER — RANOLAZINE ER 1000 MG PO TB12: 1000.0000 mg | ORAL_TABLET | Freq: Two times a day (BID) | ORAL | 3 refills | Status: DC

## 2023-03-05 MED ORDER — FUROSEMIDE 20 MG PO TABS: 20.0000 mg | ORAL_TABLET | Freq: Every day | ORAL | 3 refills | Status: DC

## 2023-03-05 NOTE — Progress Notes (Signed)
Virtual Visit via Video Note   Because of Lora E Rexrode's co-morbid illnesses, he is at least at moderate risk for complications without adequate follow up.  This format is felt to be most appropriate for this patient at this time.  All issues noted in this document were discussed and addressed.  A limited physical exam was performed with this format.  Please refer to the patient's chart for his consent to telehealth for [facility].       Date:  03/05/2023   ID:  Daniel Holden, DOB 17-Apr-1946, MRN [id number] The patient was identified using 2 identifiers.  Patient Location: Home Provider Location: Home Office   PCP:  Enid Baas, MD   [facility] Providers Cardiologist:  Armanda Magic, MD     Evaluation Performed:  Follow-Up Visit  Chief Complaint:  CAD, HTN, HLD  History of Present Illness:    Daniel Holden is a 77 y.o. male with a hx of anxiety, HTN , HLD and CAD with coronary CTA showing aortic atherosclerosis with very high coronary Ca score at 4630 with severely calcified RCA and LAD > 70% and 50-69% prox to mid LCx. Cardiac cath 10/04/2020 showed severe single vessel CAD with occluded RCA with extensive calcifications and multiple different lesions including 90% proxm and distal lesions, 50% oLCx and 50-60% dLCx and 40% pLAD with heavily calcified vessels with bridging collaterals from the septal perforator and diagonal to the PDA. Medical management was recommended. He was started on Ranexa and Imdur. He has not been able to tolerate higher doses of Imdur > 30mg  due to soft BP.    2D echo in July 2023 showed normal LV function with EF 55 to 60% and grade 1 diastolic dysfunction, mild MR, moderate left atrial enlargement, mild AI.   He is here today for followup and is doing well.  He has chronic stable angina and SOB when he takes off walking fast but if he eases into it he is fine.  He can walk up stairs after walking around but if he has  been sitting at his computer and then rushes to go up the stairs then he will have some mild CP.   He can walk up inclines as long if he starts up slow and does not bother him and his symptoms are very stable. He denies any PND, orthopnea, LE edema, dizziness, palpitations or syncope. He is compliant with his meds and is tolerating meds with no SE.     Past Medical History:  Diagnosis Date   Allergy    Angina of effort (HCC)    Anxiety    CAD (coronary artery disease), native coronary artery    coronary CTA showing aortic atherosclerosis with very high coronary Ca score at 4630 with severely calcified RCA and LAD > 70% and 50-69% prox to mid LCx.     Cancer (HCC)    Carpal tunnel syndrome    GERD (gastroesophageal reflux disease)    HLD (hyperlipidemia)    Hypertension    Lung nodule    Obesity    Pancreatitis 2015   Sleep apnea    Past Surgical History:  Procedure Laterality Date   CARPAL TUNNEL RELEASE     COLONOSCOPY WITH PROPOFOL N/A 06/27/2021   Procedure: COLONOSCOPY WITH PROPOFOL;  Surgeon: Regis Bill, MD;  Location: [facility] ENDOSCOPY;  Service: Endoscopy;  Laterality: N/A;   ESOPHAGOGASTRODUODENOSCOPY (EGD) WITH PROPOFOL N/A 06/27/2021   Procedure: ESOPHAGOGASTRODUODENOSCOPY (EGD) WITH PROPOFOL;  Surgeon:  Regis Bill, MD;  Location: [facility] ENDOSCOPY;  Service: Endoscopy;  Laterality: N/A;   ETHMOIDECTOMY Right 03/20/2021   Procedure: ANTERIOR ETHMOIDECTOMY;  Surgeon: Geanie Logan, MD;  Location: [facility] ORS;  Service: ENT;  Laterality: Right;   IMAGE GUIDED SINUS SURGERY N/A 03/20/2021   Procedure: IMAGE GUIDED SINUS SURGERY;  Surgeon: Geanie Logan, MD;  Location: [facility] ORS;  Service: ENT;  Laterality: N/A;   KNEE ARTHROSCOPY WITH MEDIAL MENISECTOMY Left 06/25/2019   Procedure: KNEE ARTHROSCOPY WITH MEDIAL MENISECTOMY;  Surgeon: Juanell Fairly, MD;  Location: [facility] ORS;  Service: Orthopedics;  Laterality: Left;   LEFT HEART CATH AND CORONARY ANGIOGRAPHY N/A 10/04/2020    Procedure: LEFT HEART CATH AND CORONARY ANGIOGRAPHY;  Surgeon: Marykay Lex, MD;  Location: [facility];  Service: Cardiovascular;  Laterality: N/A;   MAXILLARY ANTROSTOMY Right 03/20/2021   Procedure: MAXILLARY ANTROSTOMY WITH TISSUE REMOVAL;  Surgeon: Geanie Logan, MD;  Location: [facility] ORS;  Service: ENT;  Laterality: Right;   SHOULDER SURGERY     TONSILLECTOMY       No outpatient medications have been marked as taking for the 03/05/23 encounter (Video Visit) with Quintella Reichert, MD.     Allergies:   Metronidazole, Ace inhibitors, and Dilaudid [hydromorphone hcl]   Social History   Tobacco Use   Smoking status: Never   Smokeless tobacco: Never  Vaping Use   Vaping status: Never Used  Substance Use Topics   Alcohol use: Yes    Alcohol/week: 2.0 standard drinks of alcohol    Types: 2 Cans of beer per week    Comment: 6 per day   Drug use: No     Family Hx: The patient's family history includes CVA in his father.  ROS:   Please see the history of present illness.     All other systems reviewed and are negative.   Prior CV studies:   The following studies were reviewed today:  none  Labs/Other Tests and Data Reviewed:    EKG:  No ECG reviewed.  Recent Labs: No results found for requested labs within last 365 days.   Recent Lipid Panel Lab Results  Component Value Date/Time   CHOL 104 11/07/2020 07:41 AM   CHOL 83 09/27/2013 03:19 AM   TRIG 111 11/07/2020 07:41 AM   TRIG 188 09/27/2013 03:19 AM   HDL 58 11/07/2020 07:41 AM   HDL 28 (L) 09/27/2013 03:19 AM   CHOLHDL 1.8 11/07/2020 07:41 AM   LDLCALC 26 11/07/2020 07:41 AM   LDLCALC 17 09/27/2013 03:19 AM    Wt Readings from Last 3 Encounters:  03/01/22 212 lb 9.6 oz (96.4 kg)  12/11/21 212 lb 3.2 oz (96.3 kg)  11/16/21 212 lb 12.8 oz (96.5 kg)     Risk Assessment/Calculations:          Objective:    Vital Signs:  There were no vitals taken for this visit.  Well nourished, well  developed male in no acute distress. Well appearing, alert and conversant, regular work of breathing,  good skin color  Eyes- anicteric mouth- oral mucosa is pink  neuro- grossly intact skin- no apparent rash or lesions or cyanosis ASSESSMENT & PLAN:    ASCAD with chronic stable angina -coronary CTA showing aortic atherosclerosis with very high coronary Ca score at 4630 with severely calcified RCA and LAD > 70% and 50-69% prox to mid LCx.   -Cardiac cath 10/04/2020 showed severe single vessel CAD with occluded RCA with extensive calcifications and multiple  different lesions including 90% prox and distal lesions, 50% oLCx and 50-60% dLCx and 40% pLAD with heavily calcified vessels with bridging collaterals from the septal perforator and diagonal to the PDA.  -Medical management was recommended  -He has chronic stable angina which is very stable.  He gets shortness of breath and mild chest pain when walking up inclines if he has been sitting and gets up quickly to go up stairs but on flat ground he does well and can go up stairs at a slower rate with out any problems.  -Continue prescription drug management with amlodipine 5 mg daily, aspirin 81 mg daily, atorvastatin 80 mg daily, Imdur 30 mg daily, Lopressor 50 mg twice daily and Ranexa 1000 mg twice daily with as needed refills -I told him that any decline or worsening sx from what he is now, he needs to let me know  2.  HTN -BP adequately controlled on exam today -Continue prescription drug managed with amlodipine 5 mg daily, Imdur 30 mg daily, losartan 50 mg daily, Lopressor 50 mg twice daily with as needed refills -I have personally reviewed and interpreted outside labs performed by patient's PCP which showed serum creatinine 0.9, potassium 4.7 on 12/19/2022   3.  HLD -LDL goal < 70 -Continue prescription drug management with atorvastatin 80 mg daily with as needed refills -I have personally reviewed and interpreted outside labs performed by  patient's PCP which showed ALT 8, LDL 31 and HDL 51 on 12/05/2022   4.  Chronic lower extremity edema -His lower extremity edema is controlled on diuretic therapy -Continue drug management with Lasix 20 mg daily with as needed refills  Time:   Today, I have spent 15 minutes with the patient with telehealth technology discussing the above problems.     Medication Adjustments/Labs and Tests Ordered: Current medicines are reviewed at length with the patient today.  Concerns regarding medicines are outlined above.   Tests Ordered: No orders of the defined types were placed in this encounter.   Medication Changes: No orders of the defined types were placed in this encounter.   Follow Up:  In Person in 1 year(s)  Signed, Armanda Magic, MD  03/05/2023 2:56 PM    [facility]

## 2023-03-05 NOTE — Addendum Note (Signed)
Addended by: Virl Axe, Rehana Uncapher L on: 03/05/2023 03:04 PM   Modules accepted: Orders

## 2023-03-05 NOTE — Patient Instructions (Signed)
Medication Instructions:  Your physician recommends that you continue on your current medications as directed. Please refer to the Current Medication list given to you today.  *If you need a refill on your cardiac medications before your next appointment, please call your pharmacy*  Lab Work: If you have labs (blood work) drawn today and your tests are completely normal, you will receive your results only by: [place] (if you have MyChart) OR A paper copy in the mail If you have any lab test that is abnormal or we need to change your treatment, we will call you to review the results.  Testing/Procedures: None ordered today.  Follow-Up: At [facility], you and your health needs are our priority.  As part of our continuing mission to provide you with exceptional heart care, we have created designated Provider Care Teams.  These Care Teams include your primary Cardiologist (physician) and Advanced Practice Providers (APPs -  Physician Assistants and Nurse Practitioners) who all work together to provide you with the care you need, when you need it.  We recommend signing up for the patient portal called "MyChart".  Sign up information is provided on this After Visit Summary.  MyChart is used to connect with patients for Virtual Visits (Telemedicine).  Patients are able to view lab/test results, encounter notes, upcoming appointments, etc.  Non-urgent messages can be sent to your provider as well.   To learn more about what you can do with MyChart, go to [email or web address].    Your next appointment:   1 year(s)  Provider:   Fransico Him, MD

## 2023-03-21 DIAGNOSIS — Z961 Presence of intraocular lens: Secondary | ICD-10-CM | POA: Diagnosis not present

## 2023-03-30 DIAGNOSIS — M5416 Radiculopathy, lumbar region: Secondary | ICD-10-CM | POA: Diagnosis not present

## 2023-03-30 DIAGNOSIS — M47896 Other spondylosis, lumbar region: Secondary | ICD-10-CM | POA: Diagnosis not present

## 2023-03-30 DIAGNOSIS — M47816 Spondylosis without myelopathy or radiculopathy, lumbar region: Secondary | ICD-10-CM | POA: Diagnosis not present

## 2023-04-01 ENCOUNTER — Encounter: Payer: Self-pay | Admitting: Cardiology

## 2023-04-12 DIAGNOSIS — M47896 Other spondylosis, lumbar region: Secondary | ICD-10-CM | POA: Diagnosis not present

## 2023-04-12 DIAGNOSIS — M5416 Radiculopathy, lumbar region: Secondary | ICD-10-CM | POA: Diagnosis not present

## 2023-05-27 DIAGNOSIS — R7303 Prediabetes: Secondary | ICD-10-CM | POA: Diagnosis not present

## 2023-05-27 DIAGNOSIS — I10 Essential (primary) hypertension: Secondary | ICD-10-CM | POA: Diagnosis not present

## 2023-05-28 ENCOUNTER — Other Ambulatory Visit: Payer: Self-pay | Admitting: Cardiology

## 2023-06-03 DIAGNOSIS — I25118 Atherosclerotic heart disease of native coronary artery with other forms of angina pectoris: Secondary | ICD-10-CM | POA: Diagnosis not present

## 2023-06-03 DIAGNOSIS — I10 Essential (primary) hypertension: Secondary | ICD-10-CM | POA: Diagnosis not present

## 2023-06-03 DIAGNOSIS — D508 Other iron deficiency anemias: Secondary | ICD-10-CM | POA: Diagnosis not present

## 2023-06-03 DIAGNOSIS — Z125 Encounter for screening for malignant neoplasm of prostate: Secondary | ICD-10-CM | POA: Diagnosis not present

## 2023-07-02 ENCOUNTER — Telehealth: Payer: Self-pay | Admitting: Cardiology

## 2023-07-02 NOTE — Telephone Encounter (Signed)
 Patient called and said that he feels like he is regressing. Would like to talk with Dr. Mayford Knife in regards with medication and other things.

## 2023-07-08 ENCOUNTER — Other Ambulatory Visit: Payer: Self-pay | Admitting: Cardiology

## 2023-07-08 NOTE — Telephone Encounter (Signed)
 Call to patient to discuss his concerns. Patient states he has an appt w/ Dr. Shlomo 08/07/23 but has been concerned that he does not feel like I am getting better since he was last seen in September 2024. He states he feels like he is experiencing more SOB with exertion. He reports he has no energy and has had labs that show he is low on vitamin b-12, for which his PCP is treating him. His BP at home has been 123/70 and HR 56. He states he has minimal leg swelling which his manages with lasix  and compression hose.  He states he is also concerned that his ranexa  capsules are often visible in his stool and he worries he is not getting enough of the medication to control his CAD. Forwarded to Dr. Shlomo.

## 2023-07-09 NOTE — Telephone Encounter (Signed)
 Scheduled for NP Eligha Bridegroom on 07/18/23.

## 2023-07-12 ENCOUNTER — Other Ambulatory Visit: Payer: Self-pay | Admitting: Cardiology

## 2023-07-17 NOTE — Progress Notes (Unsigned)
Cardiology Office Note:  .   Date:  07/18/2023  ID:  Daniel Holden, DOB 30-Aug-1945, MRN [id number] PCP: Enid Baas, MD  [facility] Providers Cardiologist:  Armanda Magic, MD    Patient Profile: .      PMH Coronary artery disease Coronary CTA 09/29/2020 CAC score 4630 (98th percentile) Heavily calcified coronary arteries causing severe stenosis (>70%) in LAD and RCA Moderate stenosis in LCx Left heart catheterization 10/04/2020 Severe single-vessel CAD with 100% heavily calcified RCA stenosis, extensive calcification with multiple lesions including 90% proximal and distal just prior to bifurcation Heavily calcified proximal LAD and LCx with 50% ostial LCx and 40% proximal LAD LCx diffuse moderate disease most notably 50 to 60% distal OM LAD has a tandem system with major branch being large septal perforator branch that courses in direction LAD should and reaches the apex Both bridging collaterals from septal perforator provide collaterals to PDA No obvious culprit lesions for PCI LVEDP mildly elevated, normal LVEF Hypertension Hepatic steatosis Hyperlipidemia Aortic atherosclerosis Anxiety Valve disease TTE 01/11/21: Mild MR, mild AI, aortic valve sclerosis with no evidence of stenosis Lower extremity edema  He was referred to cardiology for evaluation of chest pain and seen by Dr. Mayford Knife 08/2020.  He underwent coronary CTA showing aortic atherosclerosis with very high coronary calcium score at 4630 with severely calcified RCA and LAD greater than 70% and 50 to 69% proximal to mid left circumflex.  Cardiac cath 10/04/2020 as outlined above, no targets for PCI, medical management recommended.  He was started on Ranexa and Imdur.  At follow-up visit with Dr. Mayford Knife on 10/05/2021 he restarted Imdur 30 mg daily (previously had headache but found it was due to sinus infection).  At follow-up visit, he reported continuous chest pain and shortness of breath with exertional  activity.  He felt his symptoms were overall stable.  He is able to do chores and exercise knowing his limitations.  He did not want to uptitrate Imdur further.  Seen by me on 12/11/2021 for increase in leg swelling.  It had been noticed previously but had become more significant while on vacation.  He had resumed hydrochlorothiazide 12.5 mg a few weeks prior with no significant improvement in swelling.  He denied DOE, orthopnea, PND.  He has mild bilateral arm pain that is prior symptom of angina when going up an incline or attempting to do an activity too quickly.  He was encouraged to follow a low-sodium diet.  Echocardiogram obtained 01/11/2022 and revealed normal LVEF 55 to 60%, no RWMA, G1 DD, normal RV, mild MR, mild AI, aortic valve sclerosis with no evidence of stenosis.He had improvement in swelling and stable angina at follow-up visit with Dr. Mayford Knife 02/2022.  Last clinic visit was 03/05/23 with Dr. Mayford Knife via telehealth.  Reported he was doing well.  He reported chronic stable angina and shortness of breath when he takes off walking fast but he eases into it and then is fine.  He does have symptoms if he rushes or goes up an incline.  Medical therapy was continued for angina and 1 year follow-up was recommended.       History of Present Illness: .   Daniel Holden is a very pleasant 78 y.o. male who is here today with increased shortness of breath and concerns about the efficacy of Ranexa. He reports noticing the medication passing through his stool undigested, raising concerns about absorption and effectiveness. He also describes a general lack of energy and decreased activity  levels, particularly during the winter months. He notes that he has been more sedentary recently and has experienced difficulty keeping up with physical activities, such as walking long distances with his wife.  He went outside in the cold yesterday and had more shortness of breath than usual.  He also notes a recent event  of going quickly up his basement stairs and trying to talk to his wife and was very winded. He Is somewhat limited by knee pain. He does not exercise on a consistent basis in the winter months but is very active in the summer. Has history of low B12 levels which he did not follow-up with lab testing after getting injections. He is currently on oral supplementation. He denies chest pain but has occasional heartburn. He attributes heart burn to previous prednisone use and wonders if it caused esophagitis.  He reports leg edema has been stable; he wears compression socks.  No orthopnea, PND, palpitations, presyncope, syncope.  Notes lightheadedness if he gets up too quickly.  Discussed the use of AI scribe software for clinical note transcription with the patient, who gave verbal consent to proceed.   ROS: See HPI       Studies Reviewed: [name]   EKG Interpretation Date/Time:  Thursday July 18 2023 08:53:26 EST Ventricular Rate:  56 PR Interval:  206 QRS Duration:  80 QT Interval:  398 QTC Calculation: 384 R Axis:   15  Text Interpretation: Sinus bradycardia When compared with ECG of 19-Mar-2021 15:54, No significant change was found Confirmed by Eligha Bridegroom [phone]) on 07/18/2023 9:01:18 AM    Risk Assessment/Calculations:             Physical Exam:   VS:  BP 120/70   Pulse (!) 56   Ht 5\' 6"  (1.676 m)   Wt 210 lb (95.3 kg)   SpO2 97%   BMI 33.89 kg/m    Wt Readings from Last 3 Encounters:  07/18/23 210 lb (95.3 kg)  03/01/22 212 lb 9.6 oz (96.4 kg)  12/11/21 212 lb 3.2 oz (96.3 kg)    GEN: Well nourished, well developed in no acute distress NECK: No JVD; No carotid bruits CARDIAC: RRR, no murmurs, rubs, gallops RESPIRATORY:  Clear to auscultation without rales, wheezing or rhonchi  ABDOMEN: Soft, non-tender, non-distended EXTREMITIES:  No edema; No deformity     ASSESSMENT AND PLAN: .    Dyspnea on exertion: Episodes of increased DOE recently, possibly r/t cold weather.   He admits he has been less active since the summer months.  Echo 12/2021 revealed normal LVEF 55 to 60%, no RWMA, G1 DD, normal RV. He denies orthopnea, PND, weight gain. Has chronic leg edema that is stable. He does not appear volume overloaded on exam. Encouraged increased activity with stationary bike or going to the gym with his wife.  As noted below, we are adjusting GDMT for angina.  I will see him back in a few weeks for further medication titration.  CAD with chronic angina: Heavily calcified RCA, LCx and LAD, no targets for PCI on cath 09/2020. He has had chronic stable angina but he has been concerned about increased DOE recently. EKG today is unremarkable. One episode occurred when he went quickly up stairs and was trying to talk with his wife.  He reports it took some time for him to recover.  He also noted difficulty breathing in the cold air.  We discussed avoiding cold temperatures, particularly with exertional activities.  He has not been active  on a consistent basis since the summer.  He also is concerned about seeing Ranexa in his stool, often times still whole or slightly dissolved.  We discussed GDMT for angina.  He would like to try the 500 mg tablets of Ranexa to see if these are better absorbed.  He is gena start with Ranexa 500 twice daily but we could increase to 1000 mg twice daily if these are better absorbed.  We also discussed potentially slightly increasing Imdur or amlodipine if DOE persists. I will see him back in a few weeks for continued up titration of medication.  Hypertension: BP is well controlled. We discussed that with potential medication adjustment meant, we will have to monitor for hypotension.  Hyperlipidemia LDL goal < 70: Lipid panel completed 12/05/2022 with total cholesterol 106, triglycerides 121, HDL 51.3, LDL-C 31. Lipids are well-controlled on atorvastatin. Focus on mostly plant based diet avoiding saturated fat, processed foods, simple carbohydrates, and sugar  along with aiming for at least 150 minutes of moderate intensity exercise each week.   Lower extremity edema: Stable edema for which he wears compression socks. No concerns recently. Weigh is stable. He avoids high sodium diet.         Disposition: 6 weeks with me  Signed, Eligha Bridegroom, NP-C

## 2023-07-18 ENCOUNTER — Encounter: Payer: Self-pay | Admitting: Nurse Practitioner

## 2023-07-18 ENCOUNTER — Ambulatory Visit: Payer: Medicare Other | Attending: Nurse Practitioner | Admitting: Nurse Practitioner

## 2023-07-18 VITALS — BP 120/70 | HR 56 | Ht 66.0 in | Wt 210.0 lb

## 2023-07-18 DIAGNOSIS — E785 Hyperlipidemia, unspecified: Secondary | ICD-10-CM | POA: Diagnosis not present

## 2023-07-18 DIAGNOSIS — R0609 Other forms of dyspnea: Secondary | ICD-10-CM | POA: Diagnosis not present

## 2023-07-18 DIAGNOSIS — I25118 Atherosclerotic heart disease of native coronary artery with other forms of angina pectoris: Secondary | ICD-10-CM | POA: Insufficient documentation

## 2023-07-18 DIAGNOSIS — I10 Essential (primary) hypertension: Secondary | ICD-10-CM | POA: Insufficient documentation

## 2023-07-18 DIAGNOSIS — R600 Localized edema: Secondary | ICD-10-CM | POA: Insufficient documentation

## 2023-07-18 MED ORDER — RANOLAZINE ER 500 MG PO TB12: 500.0000 mg | ORAL_TABLET | Freq: Two times a day (BID) | ORAL | 3 refills | Status: DC

## 2023-07-18 NOTE — Patient Instructions (Signed)
Medication Instructions:   DECREASE Ranexa one (1) tablet by mouth ( 500 ng) twice daily.   *If you need a refill on your cardiac medications before your next appointment, please call your pharmacy*   Lab Work:  None ordered.  If you have labs (blood work) drawn today and your tests are completely normal, you will receive your results only by: MyChart Message (if you have MyChart) OR A paper copy in the mail If you have any lab test that is abnormal or we need to change your treatment, we will call you to review the results.   Testing/Procedures:  None ordered.   Follow-Up: At [facility], you and your health needs are our priority.  As part of our continuing mission to provide you with exceptional heart care, we have created designated Provider Care Teams.  These Care Teams include your primary Cardiologist (physician) and Advanced Practice Providers (APPs -  Physician Assistants and Nurse Practitioners) who all work together to provide you with the care you need, when you need it.  We recommend signing up for the patient portal called "MyChart".  Sign up information is provided on this After Visit Summary.  MyChart is used to connect with patients for Virtual Visits (Telemedicine).  Patients are able to view lab/test results, encounter notes, upcoming appointments, etc.  Non-urgent messages can be sent to your provider as well.   To learn more about what you can do with MyChart, go to [email or web address].    Your next appointment:   6 week(s)  Provider:   Eligha Bridegroom, NP

## 2023-08-07 ENCOUNTER — Ambulatory Visit: Payer: Medicare Other | Admitting: Cardiology

## 2023-08-23 NOTE — Progress Notes (Signed)
 Cardiology Office Note:  .   Date:  08/26/2023  ID:  Daniel Holden, DOB 06-17-1946, MRN [id number] PCP: Enid Baas, MD  [facility] Providers Cardiologist:  Armanda Magic, MD    Patient Profile: .      PMH Coronary artery disease Coronary CTA 09/29/2020 CAC score 4630 (98th percentile) Heavily calcified coronary arteries causing severe stenosis (>70%) in LAD and RCA Moderate stenosis in LCx Left heart catheterization 10/04/2020 Severe single-vessel CAD with 100% heavily calcified RCA stenosis, extensive calcification with multiple lesions including 90% proximal and distal just prior to bifurcation Heavily calcified proximal LAD and LCx with 50% ostial LCx and 40% proximal LAD LCx diffuse moderate disease most notably 50 to 60% distal OM LAD has a tandem system with major branch being large septal perforator branch that courses in direction LAD should and reaches the apex Both bridging collaterals from septal perforator provide collaterals to PDA No obvious culprit lesions for PCI LVEDP mildly elevated, normal LVEF Hypertension Hepatic steatosis Hyperlipidemia Aortic atherosclerosis Anxiety Valve disease TTE 01/11/21: Mild MR, mild AI, aortic valve sclerosis with no evidence of stenosis Lower extremity edema  He was referred to cardiology for evaluation of chest pain and seen by Dr. Mayford Knife 08/2020.  He underwent coronary CTA showing aortic atherosclerosis with very high coronary calcium score at 4630 with severely calcified RCA and LAD greater than 70% and 50 to 69% proximal to mid left circumflex.  Cardiac cath 10/04/2020 as outlined above, no targets for PCI, medical management recommended.  He was started on Ranexa and Imdur.  At follow-up visit with Dr. Mayford Knife on 10/05/2021 he restarted Imdur 30 mg daily (previously had headache but found it was due to sinus infection).  At follow-up visit, he reported continuous chest pain and shortness of breath with exertional  activity.  He felt his symptoms were overall stable.  He is able to do chores and exercise knowing his limitations.  He did not want to uptitrate Imdur further.  Seen by me on 12/11/2021 for increase in leg swelling.  It had been noticed previously but had become more significant while on vacation.  He had resumed hydrochlorothiazide 12.5 mg a few weeks prior with no significant improvement in swelling.  He denied DOE, orthopnea, PND.  He has mild bilateral arm pain that is prior symptom of angina when going up an incline or attempting to do an activity too quickly.  He was encouraged to follow a low-sodium diet.  Echocardiogram obtained 01/11/2022 and revealed normal LVEF 55 to 60%, no RWMA, G1 DD, normal RV, mild MR, mild AI, aortic valve sclerosis with no evidence of stenosis.He had improvement in swelling and stable angina at follow-up visit with Dr. Mayford Knife 02/2022.  Seen by Dr. Mayford Knife on 03/05/23 via telehealth.  Reported he was doing well.  He reported chronic stable angina and shortness of breath when he takes off walking fast but he eases into it and then is fine.  He does have symptoms if he rushes or goes up an incline.  Medical therapy was continued for angina and 1 year follow-up was recommended.  Seen in clinic by me on 07/18/2023 with increased shortness of breath and concerns about the efficacy of Ranexa. Is seeing Ranexa in his stool, often times full tablet. Describes a general lack of energy and decreased activity levels, particularly during the winter months. Some difficulty with physical activities, such as walking long distances with his wife. More shortness of breath in the cold recently than usual.  He also notes a recent event of going quickly up his basement stairs and trying to talk to his wife and was very winded. History of low B12 levels which he did not follow-up with lab testing after getting injections. He is currently on oral supplementation. No chest pain. Stable leg edema for  which he wears compression. No orthopnea, PND, palpitations, presyncope, syncope.  Notes lightheadedness if he gets up too quickly. We determined to try Ranexa 500 mg tablets to see if these are better absorbed. Encouraged him to continue walking for exercise and notify us if symptoms worsen. Return in 6 weeks for follow-up.        History of Present Illness: .   Daniel Holden is a very pleasant 78 y.o. male who is here today for follow-up of angina. He is accompanied by his wife. He reports no noticeable difference in symptoms after switching Ranexa from  1000mg  to 500mg  twice daily. He has not observed any pills in his stool, suggesting that the medication is being absorbed. He reports being able to perform his desired activities with minimal discomfort, but he must be cautious not to exert himself too much, as he can become short of breath. He has noticed that cold weather seems to exacerbate his symptoms. He has been reading about amlodipine and is considering increasing his dose from 5mg  to 7.5mg , but he is concerned about potential side effects. He has been monitoring BP at home every two weeks and reports no concerns. He denies lightheadedness, only when going from sitting to standing quickly. He report shortness of breath only with more intense exertion like running up stairs. He denies palpitations, orthopnea, PND, edema, presyncope or syncope. Has been maintaining an active lifestyle, including using a recumbent bike and doing yard work. He has also been mindful of his diet, avoiding fast food and limiting his intake of fatty meats.   Discussed the use of AI scribe software for clinical note transcription with the patient, who gave verbal consent to proceed.   ROS: See HPI       Studies Reviewed: .        Risk Assessment/Calculations:             Physical Exam:   VS:  BP 124/72   Pulse 65   Ht 5\' 6"  (1.676 m)   Wt 210 lb 9.6 oz (95.5 kg)   SpO2 95%   BMI 33.99 kg/m    Wt  Readings from Last 3 Encounters:  08/26/23 210 lb 9.6 oz (95.5 kg)  07/18/23 210 lb (95.3 kg)  03/01/22 212 lb 9.6 oz (96.4 kg)    GEN: Well nourished, well developed in no acute distress NECK: No JVD; No carotid bruits CARDIAC: RRR, no murmurs, rubs, gallops RESPIRATORY:  Clear to auscultation without rales, wheezing or rhonchi  ABDOMEN: Soft, non-tender, non-distended EXTREMITIES:  No edema; No deformity     ASSESSMENT AND PLAN: .    Dyspnea on exertion: He continues to have occasional DOE with significant exertion. He remains active with riding stationary bike and yard work with no significant symptoms. He does notice increased chest tightness secondary to cold weather. Echo 12/2021 revealed normal LVEF 55 to 60%, no RWMA, G1 DD, normal RV. He denies orthopnea, PND, weight gain. Has chronic leg edema that is stable. He does not appear volume overloaded on exam.   CAD with chronic angina: Heavily calcified RCA, LCx and LAD, no targets for PCI on cath 09/2020. Seen by me on  07/18/23 for increased DOE. He felt that Ranexa 1000 mg was not being absorbed as he was seeing it in his stool frequently. We had him start Ranexa 500 twice daily but we could increase to 1000 mg twice daily if these are better absorbed. We also discussed potentially slightly increasing Imdur or amlodipine if DOE persists. He would prefer increasing amlodipine, but based on current symptoms does not feel that he needs additional medication. He is feeling well and feels that chest tightness most often occurs in cold air. He has DOE with significant exertion but is able to continue with moderate obstruction without concerning symptoms. No bleeding concerns. We will continue with GDMT including aspirin 81 mg daily, atorvastatin 80 mg daily, Ranexa 500 mg twice daily, losartan 50 mg daily, Lopressor 50 mg twice daily, amlodipine 5 mg daily, and Imdur 30 mg daily. Focus on secondary prevention including heart healthy mostly plant based  diet avoiding saturated fat, processed foods, simple carbohydrates, and sugar along with aiming for at least 150 minutes of moderate intensity exercise each week.   Hypertension: BP is well controlled. Renal function stable on labs completed 05/27/23.   Hyperlipidemia LDL goal < 55: Lipid panel completed 12/05/2022 with total cholesterol 106, triglycerides 121, HDL 51.3, LDL-C 31. Lipids are well-controlled on atorvastatin. Will likely have repeat lab work in June with PCP. Plan for repeat lipid testing at next office visit if not done prior. Focus on mostly plant based diet avoiding saturated fat, processed foods, simple carbohydrates, and sugar along with aiming for at least 150 minutes of moderate intensity exercise each week.   Lower extremity edema: Stable LE edema. No acute concerns today. We discussed possibly of this worsening on higher dose of amlodipine, he is going to continue with amlodipine 5 mg daily.        Disposition: 6 months with Dr. Mayford Knife  Signed, Eligha Bridegroom, NP-C

## 2023-08-26 ENCOUNTER — Encounter: Payer: Self-pay | Admitting: Nurse Practitioner

## 2023-08-26 ENCOUNTER — Ambulatory Visit: Payer: Medicare Other | Attending: Nurse Practitioner | Admitting: Nurse Practitioner

## 2023-08-26 VITALS — BP 124/72 | HR 65 | Ht 66.0 in | Wt 210.6 lb

## 2023-08-26 DIAGNOSIS — R600 Localized edema: Secondary | ICD-10-CM | POA: Insufficient documentation

## 2023-08-26 DIAGNOSIS — I10 Essential (primary) hypertension: Secondary | ICD-10-CM | POA: Diagnosis not present

## 2023-08-26 DIAGNOSIS — R0609 Other forms of dyspnea: Secondary | ICD-10-CM | POA: Insufficient documentation

## 2023-08-26 DIAGNOSIS — E785 Hyperlipidemia, unspecified: Secondary | ICD-10-CM | POA: Diagnosis not present

## 2023-08-26 DIAGNOSIS — I25118 Atherosclerotic heart disease of native coronary artery with other forms of angina pectoris: Secondary | ICD-10-CM | POA: Diagnosis not present

## 2023-08-26 DIAGNOSIS — E7800 Pure hypercholesterolemia, unspecified: Secondary | ICD-10-CM | POA: Diagnosis not present

## 2023-08-26 NOTE — Patient Instructions (Addendum)
Medication Instructions:   Your physician recommends that you continue on your current medications as directed. Please refer to the Current Medication list given to you today.   *If you need a refill on your cardiac medications before your next appointment, please call your pharmacy*   Lab Work:  None ordered.  If you have labs (blood work) drawn today and your tests are completely normal, you will receive your results only by: MyChart Message (if you have MyChart) OR A paper copy in the mail If you have any lab test that is abnormal or we need to change your treatment, we will call you to review the results.   Testing/Procedures:  None ordered.   Follow-Up: At [facility], you and your health needs are our priority.  As part of our continuing mission to provide you with exceptional heart care, we have created designated Provider Care Teams.  These Care Teams include your primary Cardiologist (physician) and Advanced Practice Providers (APPs -  Physician Assistants and Nurse Practitioners) who all work together to provide you with the care you need, when you need it.  We recommend signing up for the patient portal called "MyChart".  Sign up information is provided on this After Visit Summary.  MyChart is used to connect with patients for Virtual Visits (Telemedicine).  Patients are able to view lab/test results, encounter notes, upcoming appointments, etc.  Non-urgent messages can be sent to your provider as well.   To learn more about what you can do with MyChart, go to [email or web address].    Your next appointment:   6 month(s)  Provider:   Armanda Magic, MD     Other Instructions   Your physician wants you to follow-up in: 6 months.  You will receive a reminder letter in the mail two months in advance. If you don't receive a letter, please call our office to schedule the follow-up appointment.

## 2023-09-04 ENCOUNTER — Other Ambulatory Visit: Payer: Self-pay | Admitting: Cardiology

## 2023-09-16 DIAGNOSIS — M1711 Unilateral primary osteoarthritis, right knee: Secondary | ICD-10-CM | POA: Diagnosis not present

## 2023-09-23 DIAGNOSIS — M1711 Unilateral primary osteoarthritis, right knee: Secondary | ICD-10-CM | POA: Diagnosis not present

## 2023-10-24 ENCOUNTER — Encounter: Payer: Self-pay | Admitting: Cardiology

## 2023-12-03 DIAGNOSIS — D2261 Melanocytic nevi of right upper limb, including shoulder: Secondary | ICD-10-CM | POA: Diagnosis not present

## 2023-12-03 DIAGNOSIS — Z85828 Personal history of other malignant neoplasm of skin: Secondary | ICD-10-CM | POA: Diagnosis not present

## 2023-12-03 DIAGNOSIS — L570 Actinic keratosis: Secondary | ICD-10-CM | POA: Diagnosis not present

## 2023-12-03 DIAGNOSIS — D485 Neoplasm of uncertain behavior of skin: Secondary | ICD-10-CM | POA: Diagnosis not present

## 2023-12-03 DIAGNOSIS — D225 Melanocytic nevi of trunk: Secondary | ICD-10-CM | POA: Diagnosis not present

## 2023-12-03 DIAGNOSIS — D2262 Melanocytic nevi of left upper limb, including shoulder: Secondary | ICD-10-CM | POA: Diagnosis not present

## 2023-12-03 DIAGNOSIS — D2272 Melanocytic nevi of left lower limb, including hip: Secondary | ICD-10-CM | POA: Diagnosis not present

## 2023-12-03 DIAGNOSIS — D0462 Carcinoma in situ of skin of left upper limb, including shoulder: Secondary | ICD-10-CM | POA: Diagnosis not present

## 2023-12-16 DIAGNOSIS — E559 Vitamin D deficiency, unspecified: Secondary | ICD-10-CM | POA: Diagnosis not present

## 2023-12-16 DIAGNOSIS — D508 Other iron deficiency anemias: Secondary | ICD-10-CM | POA: Diagnosis not present

## 2023-12-16 DIAGNOSIS — I10 Essential (primary) hypertension: Secondary | ICD-10-CM | POA: Diagnosis not present

## 2023-12-16 DIAGNOSIS — Z125 Encounter for screening for malignant neoplasm of prostate: Secondary | ICD-10-CM | POA: Diagnosis not present

## 2023-12-16 DIAGNOSIS — E538 Deficiency of other specified B group vitamins: Secondary | ICD-10-CM | POA: Diagnosis not present

## 2023-12-16 DIAGNOSIS — R7303 Prediabetes: Secondary | ICD-10-CM | POA: Diagnosis not present

## 2023-12-23 DIAGNOSIS — G4733 Obstructive sleep apnea (adult) (pediatric): Secondary | ICD-10-CM | POA: Diagnosis not present

## 2023-12-23 DIAGNOSIS — R972 Elevated prostate specific antigen [PSA]: Secondary | ICD-10-CM | POA: Diagnosis not present

## 2023-12-23 DIAGNOSIS — R911 Solitary pulmonary nodule: Secondary | ICD-10-CM | POA: Diagnosis not present

## 2023-12-23 DIAGNOSIS — E871 Hypo-osmolality and hyponatremia: Secondary | ICD-10-CM | POA: Diagnosis not present

## 2023-12-23 DIAGNOSIS — R7303 Prediabetes: Secondary | ICD-10-CM | POA: Diagnosis not present

## 2023-12-23 DIAGNOSIS — Z0000 Encounter for general adult medical examination without abnormal findings: Secondary | ICD-10-CM | POA: Diagnosis not present

## 2023-12-23 DIAGNOSIS — Z1331 Encounter for screening for depression: Secondary | ICD-10-CM | POA: Diagnosis not present

## 2023-12-23 DIAGNOSIS — E538 Deficiency of other specified B group vitamins: Secondary | ICD-10-CM | POA: Diagnosis not present

## 2023-12-24 DIAGNOSIS — I480 Paroxysmal atrial fibrillation: Secondary | ICD-10-CM | POA: Insufficient documentation

## 2023-12-30 ENCOUNTER — Encounter: Payer: Self-pay | Admitting: Cardiology

## 2023-12-30 NOTE — Telephone Encounter (Signed)
 Spoke with pt regarding his chest pain. Pt stated in the last couple weeks his angina has become more frequent and moderate. Pt stated it is only with exertion that he notices it. Pt stated that he usually exerts himself, gets short of breath and then has chest pain that is relieved when he rests. Pt is taking Imdur  and Ranexa  as prescribed. Pt believes that the heat may also be affecting him. Pt was told that if he begins to have chest pain that does not go away or radiates, or shortness of breath at rest that he should report to the ED. Pt was told the information provided would be sent to Dr. Shlomo and her nurse for their suggestions. Pt verbalized understanding. All questions if any were answered.

## 2023-12-31 ENCOUNTER — Other Ambulatory Visit: Payer: Self-pay | Admitting: Internal Medicine

## 2023-12-31 DIAGNOSIS — R911 Solitary pulmonary nodule: Secondary | ICD-10-CM

## 2023-12-31 DIAGNOSIS — Z0000 Encounter for general adult medical examination without abnormal findings: Secondary | ICD-10-CM

## 2024-01-01 ENCOUNTER — Emergency Department

## 2024-01-01 ENCOUNTER — Inpatient Hospital Stay
Admission: EM | Admit: 2024-01-01 | Discharge: 2024-01-03 | DRG: 281 | Disposition: A | Discharge status: Other Acute Inpatient Hospital | Attending: Obstetrics and Gynecology | Admitting: Obstetrics and Gynecology

## 2024-01-01 DIAGNOSIS — F411 Generalized anxiety disorder: Secondary | ICD-10-CM | POA: Diagnosis not present

## 2024-01-01 DIAGNOSIS — E669 Obesity, unspecified: Secondary | ICD-10-CM | POA: Diagnosis not present

## 2024-01-01 DIAGNOSIS — E878 Other disorders of electrolyte and fluid balance, not elsewhere classified: Secondary | ICD-10-CM | POA: Diagnosis present

## 2024-01-01 DIAGNOSIS — G4733 Obstructive sleep apnea (adult) (pediatric): Secondary | ICD-10-CM | POA: Diagnosis present

## 2024-01-01 DIAGNOSIS — D539 Nutritional anemia, unspecified: Secondary | ICD-10-CM | POA: Diagnosis present

## 2024-01-01 DIAGNOSIS — Z888 Allergy status to other drugs, medicaments and biological substances status: Secondary | ICD-10-CM | POA: Diagnosis not present

## 2024-01-01 DIAGNOSIS — K219 Gastro-esophageal reflux disease without esophagitis: Secondary | ICD-10-CM | POA: Diagnosis present

## 2024-01-01 DIAGNOSIS — Z6834 Body mass index (BMI) 34.0-34.9, adult: Secondary | ICD-10-CM | POA: Diagnosis not present

## 2024-01-01 DIAGNOSIS — I10 Essential (primary) hypertension: Secondary | ICD-10-CM | POA: Diagnosis present

## 2024-01-01 DIAGNOSIS — E871 Hypo-osmolality and hyponatremia: Secondary | ICD-10-CM | POA: Diagnosis present

## 2024-01-01 DIAGNOSIS — E785 Hyperlipidemia, unspecified: Secondary | ICD-10-CM | POA: Diagnosis present

## 2024-01-01 DIAGNOSIS — I480 Paroxysmal atrial fibrillation: Secondary | ICD-10-CM | POA: Diagnosis present

## 2024-01-01 DIAGNOSIS — I2510 Atherosclerotic heart disease of native coronary artery without angina pectoris: Secondary | ICD-10-CM | POA: Insufficient documentation

## 2024-01-01 DIAGNOSIS — I110 Hypertensive heart disease with heart failure: Secondary | ICD-10-CM | POA: Diagnosis not present

## 2024-01-01 DIAGNOSIS — Z79899 Other long term (current) drug therapy: Secondary | ICD-10-CM

## 2024-01-01 DIAGNOSIS — I5032 Chronic diastolic (congestive) heart failure: Secondary | ICD-10-CM | POA: Diagnosis present

## 2024-01-01 DIAGNOSIS — I4891 Unspecified atrial fibrillation: Secondary | ICD-10-CM | POA: Diagnosis not present

## 2024-01-01 DIAGNOSIS — F419 Anxiety disorder, unspecified: Secondary | ICD-10-CM | POA: Diagnosis present

## 2024-01-01 DIAGNOSIS — Z823 Family history of stroke: Secondary | ICD-10-CM

## 2024-01-01 DIAGNOSIS — E119 Type 2 diabetes mellitus without complications: Secondary | ICD-10-CM | POA: Diagnosis present

## 2024-01-01 DIAGNOSIS — I700 Atherosclerosis of aorta: Secondary | ICD-10-CM | POA: Diagnosis present

## 2024-01-01 DIAGNOSIS — Z7982 Long term (current) use of aspirin: Secondary | ICD-10-CM

## 2024-01-01 DIAGNOSIS — I5030 Unspecified diastolic (congestive) heart failure: Secondary | ICD-10-CM | POA: Diagnosis not present

## 2024-01-01 DIAGNOSIS — I25118 Atherosclerotic heart disease of native coronary artery with other forms of angina pectoris: Secondary | ICD-10-CM | POA: Diagnosis not present

## 2024-01-01 DIAGNOSIS — I200 Unstable angina: Secondary | ICD-10-CM | POA: Diagnosis not present

## 2024-01-01 DIAGNOSIS — I214 Non-ST elevation (NSTEMI) myocardial infarction: Principal | ICD-10-CM | POA: Diagnosis present

## 2024-01-01 DIAGNOSIS — R079 Chest pain, unspecified: Secondary | ICD-10-CM | POA: Diagnosis not present

## 2024-01-01 LAB — BASIC METABOLIC PANEL WITH GFR
Anion gap: 12 (ref 5–15)
BUN: 15 mg/dL (ref 8–23)
CO2: 24 mmol/L (ref 22–32)
Calcium: 9.1 mg/dL (ref 8.9–10.3)
Chloride: 92 mmol/L — ABNORMAL LOW (ref 98–111)
Creatinine, Ser: 0.73 mg/dL (ref 0.61–1.24)
GFR, Estimated: >60 mL/min (ref >60)
Glucose, Bld: 163 mg/dL — ABNORMAL HIGH (ref 70–99)
Potassium: 3.5 mmol/L (ref 3.5–5.1)
Sodium: 128 mmol/L — ABNORMAL LOW (ref 135–145)

## 2024-01-01 LAB — CBC
HCT: 33.3 % — ABNORMAL LOW (ref 39.0–52.0)
Hemoglobin: 12.1 g/dL — ABNORMAL LOW (ref 13.0–17.0)
MCH: 38.1 pg — ABNORMAL HIGH (ref 26.0–34.0)
MCHC: 36.3 g/dL — ABNORMAL HIGH (ref 30.0–36.0)
MCV: 104.7 fL — ABNORMAL HIGH (ref 80.0–100.0)
Platelets: 187 K/uL (ref 150–400)
RBC: 3.18 MIL/uL — ABNORMAL LOW (ref 4.22–5.81)
RDW: 11.6 % (ref 11.5–15.5)
WBC: 6.5 K/uL (ref 4.0–10.5)
nRBC: 0 % (ref 0.0–0.2)

## 2024-01-01 LAB — TROPONIN I (HIGH SENSITIVITY): Troponin I (High Sensitivity): 125 ng/L (ref <18)

## 2024-01-01 MED ORDER — HEPARIN BOLUS VIA INFUSION
4000.0000 [IU] | Freq: Once | INTRAVENOUS | Status: AC
  Administered 2024-01-02: 4000 [IU] via INTRAVENOUS
  Filled 2024-01-01: qty 4000

## 2024-01-01 MED ORDER — ASPIRIN 81 MG PO CHEW: 324.0000 mg | CHEWABLE_TABLET | Freq: Once | ORAL | Status: DC

## 2024-01-01 MED ORDER — HEPARIN (PORCINE) 25000 UT/250ML-% IV SOLN
1200.0000 [IU]/h | INTRAVENOUS | Status: DC
  Administered 2024-01-02: 1000 [IU]/h via INTRAVENOUS
  Filled 2024-01-01: qty 250

## 2024-01-01 NOTE — ED Triage Notes (Signed)
 Pt BIB EMS for chest pain that started around 1900 that has progressively gotten worse. Patient endorses a hx of CAD, unstable angina. EMS reports the patient to be in a-fib on their arrival in the 120's. EMS gave 324mg  ASA, 1 spray nitroglycerin , and 10mg  Cardizem PTA of facility. Patient denies chest pain at triage rating 0.5/10 scale. No distress noted.

## 2024-01-01 NOTE — Progress Notes (Deleted)
 HPI: Follow-up coronary artery disease.  Patient is followed by Dr. Shlomo.  Added to my schedule today for worsening chest pain.  Cardiac catheterization in April 2022 showed 95% proximal RCA, occluded mid to distal RCA, 80% sidebranch in RPAV, 50% ostial circumflex, 40% LAD, 20% left main and normal LV function.  Patient has been treated medically.  Based on review of previous notes patient has chronic stable angina and dyspnea on exertion.  Echocardiogram July 2023 showed normal LV function, grade 1 diastolic dysfunction, moderate left atrial enlargement, mild mitral regurgitation, mild aortic insufficiency.  Since last seen  Current Outpatient Medications  Medication Sig Dispense Refill   Acetaminophen  (TYLENOL ) 325 MG CAPS Take by mouth. As needed     ALPRAZolam  (XANAX ) 0.25 MG tablet Take 1 tablet (0.25 mg total) by mouth at bedtime as needed for sleep. 30 tablet 0   amLODipine  (NORVASC ) 5 MG tablet TAKE 1 TABLET EVERY DAY 90 tablet 2   aspirin  81 MG EC tablet Take 81 mg by mouth daily.     atorvastatin  (LIPITOR) 80 MG tablet TAKE 1 TABLET EVERY DAY 90 tablet 2   Cholecalciferol  250 MCG (10000 UT) TABS Take 1,000 Units by mouth daily.     cyanocobalamin  1000 MCG tablet Take 1,000 mcg by mouth daily.     fluticasone  (FLONASE ) 50 MCG/ACT nasal spray Place 2 sprays into both nostrils daily.      furosemide  (LASIX ) 20 MG tablet Take 1 tablet (20 mg total) by mouth daily. 90 tablet 3   isosorbide  mononitrate (IMDUR ) 30 MG 24 hr tablet TAKE 1 TABLET EVERY DAY 90 tablet 2   losartan  (COZAAR ) 50 MG tablet TAKE 1 TABLET EVERY DAY 90 tablet 3   melatonin 3 MG TABS tablet Take 10 mg by mouth at bedtime.     metoprolol  tartrate (LOPRESSOR ) 50 MG tablet TAKE 1 TABLET TWICE DAILY 180 tablet 2   naproxen  sodium (ALEVE ) 220 MG tablet Take 1 tablet (220 mg total) by mouth 2 (two) times daily as needed.     omeprazole  (PRILOSEC  OTC) 20 MG tablet Take 20 mg by mouth daily.     ranolazine  (RANEXA ) 500  MG 12 hr tablet Take 1 tablet (500 mg total) by mouth 2 (two) times daily. 180 tablet 3   No current facility-administered medications for this visit.     Past Medical History:  Diagnosis Date   Allergy    Angina of effort (HCC)    Anxiety    CAD (coronary artery disease), native coronary artery    coronary CTA showing aortic atherosclerosis with very high coronary Ca score at 4630 with severely calcified RCA and LAD > 70% and 50-69% prox to mid LCx.     Cancer (HCC)    Carpal tunnel syndrome    GERD (gastroesophageal reflux disease)    HLD (hyperlipidemia)    Hypertension    Lung nodule    Obesity    Pancreatitis 2015   Sleep apnea     Past Surgical History:  Procedure Laterality Date   CARPAL TUNNEL RELEASE     COLONOSCOPY WITH PROPOFOL  N/A 06/27/2021   Procedure: COLONOSCOPY WITH PROPOFOL ;  Surgeon: Maryruth Ole DASEN, MD;  Location: [facility];  Service: Endoscopy;  Laterality: N/A;   ESOPHAGOGASTRODUODENOSCOPY (EGD) WITH PROPOFOL  N/A 06/27/2021   Procedure: ESOPHAGOGASTRODUODENOSCOPY (EGD) WITH PROPOFOL ;  Surgeon: Maryruth Ole DASEN, MD;  Location: [facility];  Service: Endoscopy;  Laterality: N/A;   ETHMOIDECTOMY Right 03/20/2021   Procedure: ANTERIOR ETHMOIDECTOMY;  Surgeon: Blair Mt, MD;  Location: [facility];  Service: ENT;  Laterality: Right;   IMAGE GUIDED SINUS SURGERY N/A 03/20/2021   Procedure: IMAGE GUIDED SINUS SURGERY;  Surgeon: Blair Mt, MD;  Location: [facility];  Service: ENT;  Laterality: N/A;   KNEE ARTHROSCOPY WITH MEDIAL MENISECTOMY Left 06/25/2019   Procedure: KNEE ARTHROSCOPY WITH MEDIAL MENISECTOMY;  Surgeon: Marchia Drivers, MD;  Location: [facility];  Service: Orthopedics;  Laterality: Left;   LEFT HEART CATH AND CORONARY ANGIOGRAPHY N/A 10/04/2020   Procedure: LEFT HEART CATH AND CORONARY ANGIOGRAPHY;  Surgeon: Anner Alm ORN, MD;  Location: [facility] INVASIVE CV LAB;  Service: Cardiovascular;  Laterality: N/A;   MAXILLARY ANTROSTOMY Right  03/20/2021   Procedure: MAXILLARY ANTROSTOMY WITH TISSUE REMOVAL;  Surgeon: Blair Mt, MD;  Location: [facility];  Service: ENT;  Laterality: Right;   SHOULDER SURGERY     TONSILLECTOMY      Social History   Socioeconomic History   Marital status: Married    Spouse name: Not on file   Number of children: Not on file   Years of education: Not on file   Highest education level: Not on file  Occupational History   Not on file  Tobacco Use   Smoking status: Never   Smokeless tobacco: Never  Vaping Use   Vaping status: Never Used  Substance and Sexual Activity   Alcohol use: Yes    Alcohol/week: 2.0 standard drinks of alcohol    Types: 2 Cans of beer per week    Comment: 6 per day   Drug use: No   Sexual activity: Not on file  Other Topics Concern   Not on file  Social History Narrative   Not on file   Social Drivers of Health   Financial Resource Strain: Low Risk  (12/23/2023)   Received from [facility]   Overall Financial Resource Strain (CARDIA)    Difficulty of Paying Living Expenses: Not hard at all  Food Insecurity: No Food Insecurity (12/23/2023)   Received from [facility]   Hunger Vital Sign    Within the past 12 months, you worried that your food would run out before you got the money to buy more.: Never true    Within the past 12 months, the food you bought just didn't last and you didn't have money to get more.: Never true  Transportation Needs: No Transportation Needs (12/23/2023)   Received from [facility] - Transportation    In the past 12 months, has lack of transportation kept you from medical appointments or from getting medications?: No    Lack of Transportation (Non-Medical): No  Physical Activity: Not on file  Stress: Not on file  Social Connections: Not on file  Intimate Partner Violence: Not on file    Family History  Problem Relation Age of Onset   CVA Father     ROS: no  fevers or chills, productive cough, hemoptysis, dysphasia, odynophagia, melena, hematochezia, dysuria, hematuria, rash, seizure activity, orthopnea, PND, pedal edema, claudication. Remaining systems are negative.  Physical Exam: Well-developed well-nourished in no acute distress.  Skin is warm and dry.  HEENT is normal.  Neck is supple.  Chest is clear to auscultation with normal expansion.  Cardiovascular exam is regular rate and rhythm.  Abdominal exam nontender or distended. No masses palpated. Extremities show no edema. neuro grossly intact  ECG- personally reviewed  A/P  1 chest pain-  2 coronary artery disease-continue  aspirin  and statin.  3 hyperlipidemia-continue Lipitor at present dose.  4 hypertension-patient's blood pressure is controlled.  Continue present medical regimen.  5 history of lower extremity edema-  Redell Shallow, MD

## 2024-01-01 NOTE — ED Notes (Signed)
 CCMD notified of monitoring order

## 2024-01-01 NOTE — ED Notes (Signed)
 ED Provider at bedside.

## 2024-01-01 NOTE — Progress Notes (Signed)
 ANTICOAGULATION CONSULT NOTE  Pharmacy Consult for heparin  infusion Indication: ACS/STEMI  Allergies  Allergen Reactions   Metronidazole  Other (See Comments)    Patient stated that he had a moderate level of pain with his allergic reaction.    Ace Inhibitors Cough   Dilaudid [Hydromorphone Hcl] Nausea And Vomiting    Patient Measurements: Height: 5' 6 (167.6 cm) Weight: 99.8 kg (220 lb) IBW/kg (Calculated) : 63.8 HEPARIN  DW (KG): 85.8  Vital Signs: Temp: 98.4 F (36.9 C) (07/09 2204) Temp Source: Oral (07/09 2204) BP: 138/80 (07/09 2230) Pulse Rate: 108 (07/09 2230)  Labs: Recent Labs    01/01/24 2213  HGB 12.1*  HCT 33.3*  PLT 187  CREATININE 0.73  TROPONINIHS 125*    Estimated Creatinine Clearance: 88.2 mL/min (by C-G formula based on SCr of 0.73 mg/dL).   Medical History: Past Medical History:  Diagnosis Date   Allergy    Angina of effort (HCC)    Anxiety    CAD (coronary artery disease), native coronary artery    coronary CTA showing aortic atherosclerosis with very high coronary Ca score at 4630 with severely calcified RCA and LAD > 70% and 50-69% prox to mid LCx.     Cancer (HCC)    Carpal tunnel syndrome    GERD (gastroesophageal reflux disease)    HLD (hyperlipidemia)    Hypertension    Lung nodule    Obesity    Pancreatitis 2015   Sleep apnea     Assessment: Pt is a 78 yo male presenting to ED c/o chest pain found with elevated Troponin I level.  Goal of Therapy:  Heparin  level 0.3-0.7 units/ml Monitor platelets by anticoagulation protocol: Yes   Plan:  Bolus 4000 units x 1 Start heparin  infusion at 1150 units/hr Will check HL in 8 hr after start of infusion CBC daily while on heparin   Rankin CANDIE Dills, PharmD, [facility] 01/01/2024 11:39 PM

## 2024-01-01 NOTE — H&P (Signed)
 [place]   PATIENT NAME: Daniel Holden    MR#:  [id number]  DATE OF BIRTH:  10/19/1945  DATE OF ADMISSION:  01/01/2024  PRIMARY CARE PHYSICIAN: Sherial Bail, MD   Patient is coming from: ***  REQUESTING/REFERRING PHYSICIAN: ***  CHIEF COMPLAINT:   Chief Complaint  Patient presents with   Chest Pain    HISTORY OF PRESENT ILLNESS:  Daniel Holden is a 78 y.o. male with medical history significant for ***  ED Course: *** EKG as reviewed by me : *** Imaging: *** PAST MEDICAL HISTORY:   Past Medical History:  Diagnosis Date   Allergy    Angina of effort (HCC)    Anxiety    CAD (coronary artery disease), native coronary artery    coronary CTA showing aortic atherosclerosis with very high coronary Ca score at 4630 with severely calcified RCA and LAD > 70% and 50-69% prox to mid LCx.     Cancer (HCC)    Carpal tunnel syndrome    GERD (gastroesophageal reflux disease)    HLD (hyperlipidemia)    Hypertension    Lung nodule    Obesity    Pancreatitis 2015   Sleep apnea     PAST SURGICAL HISTORY:   Past Surgical History:  Procedure Laterality Date   CARPAL TUNNEL RELEASE     COLONOSCOPY WITH PROPOFOL  N/A 06/27/2021   Procedure: COLONOSCOPY WITH PROPOFOL ;  Surgeon: Maryruth Ole DASEN, MD;  Location: [facility];  Service: Endoscopy;  Laterality: N/A;   ESOPHAGOGASTRODUODENOSCOPY (EGD) WITH PROPOFOL  N/A 06/27/2021   Procedure: ESOPHAGOGASTRODUODENOSCOPY (EGD) WITH PROPOFOL ;  Surgeon: Maryruth Ole DASEN, MD;  Location: [facility];  Service: Endoscopy;  Laterality: N/A;   ETHMOIDECTOMY Right 03/20/2021   Procedure: ANTERIOR ETHMOIDECTOMY;  Surgeon: Blair Mt, MD;  Location: [facility];  Service: ENT;  Laterality: Right;   IMAGE GUIDED SINUS SURGERY N/A 03/20/2021   Procedure: IMAGE GUIDED SINUS SURGERY;  Surgeon: Blair Mt, MD;  Location: [facility];  Service: ENT;  Laterality: N/A;   KNEE ARTHROSCOPY WITH MEDIAL MENISECTOMY Left 06/25/2019    Procedure: KNEE ARTHROSCOPY WITH MEDIAL MENISECTOMY;  Surgeon: Marchia Drivers, MD;  Location: [facility];  Service: Orthopedics;  Laterality: Left;   LEFT HEART CATH AND CORONARY ANGIOGRAPHY N/A 10/04/2020   Procedure: LEFT HEART CATH AND CORONARY ANGIOGRAPHY;  Surgeon: Anner Alm ORN, MD;  Location: [facility] INVASIVE CV LAB;  Service: Cardiovascular;  Laterality: N/A;   MAXILLARY ANTROSTOMY Right 03/20/2021   Procedure: MAXILLARY ANTROSTOMY WITH TISSUE REMOVAL;  Surgeon: Blair Mt, MD;  Location: [facility];  Service: ENT;  Laterality: Right;   SHOULDER SURGERY     TONSILLECTOMY      SOCIAL HISTORY:   Social History   Tobacco Use   Smoking status: Never   Smokeless tobacco: Never  Substance Use Topics   Alcohol use: Yes    Alcohol/week: 2.0 standard drinks of alcohol    Types: 2 Cans of beer per week    Comment: 6 per day    FAMILY HISTORY:   Family History  Problem Relation Age of Onset   CVA Father     DRUG ALLERGIES:   Allergies  Allergen Reactions   Metronidazole  Other (See Comments)    Patient stated that he had a moderate level of pain with his allergic reaction.    Ace Inhibitors Cough   Dilaudid [Hydromorphone Hcl] Nausea And Vomiting    REVIEW OF SYSTEMS:   ROS As per history of present illness. All pertinent  systems were reviewed above. Constitutional, HEENT, cardiovascular, respiratory, GI, GU, musculoskeletal, neuro, psychiatric, endocrine, integumentary and hematologic systems were reviewed and are otherwise negative/unremarkable except for positive findings mentioned above in the HPI.   MEDICATIONS AT HOME:   Prior to Admission medications   Medication Sig Start Date End Date Taking? Authorizing Provider  Acetaminophen  (TYLENOL ) 325 MG CAPS Take by mouth. As needed    [provider]  ALPRAZolam  (XANAX ) 0.25 MG tablet Take 1 tablet (0.25 mg total) by mouth at bedtime as needed for sleep. 04/24/21   Britta King, MD  amLODipine  (NORVASC ) 5 MG  tablet TAKE 1 TABLET EVERY DAY 05/30/23   Shlomo Wilbert SAUNDERS, MD  aspirin  81 MG EC tablet Take 81 mg by mouth daily.    [provider]  atorvastatin  (LIPITOR) 80 MG tablet TAKE 1 TABLET EVERY DAY 05/28/23   Shlomo Wilbert SAUNDERS, MD  Cholecalciferol  250 MCG (10000 UT) TABS Take 1,000 Units by mouth daily.    [provider]  cyanocobalamin  1000 MCG tablet Take 1,000 mcg by mouth daily.    [provider]  fluticasone  (FLONASE ) 50 MCG/ACT nasal spray Place 2 sprays into both nostrils daily.  10/27/13   [provider]  furosemide  (LASIX ) 20 MG tablet Take 1 tablet (20 mg total) by mouth daily. 03/05/23   Shlomo Wilbert SAUNDERS, MD  isosorbide  mononitrate (IMDUR ) 30 MG 24 hr tablet TAKE 1 TABLET EVERY DAY 05/28/23   Shlomo Wilbert SAUNDERS, MD  losartan  (COZAAR ) 50 MG tablet TAKE 1 TABLET EVERY DAY 09/04/23   Shlomo Wilbert SAUNDERS, MD  melatonin 3 MG TABS tablet Take 10 mg by mouth at bedtime.    [provider]  metoprolol  tartrate (LOPRESSOR ) 50 MG tablet TAKE 1 TABLET TWICE DAILY 05/28/23   Shlomo Wilbert SAUNDERS, MD  naproxen  sodium (ALEVE ) 220 MG tablet Take 1 tablet (220 mg total) by mouth 2 (two) times daily as needed. 03/23/21   Antoinette Doe, MD  omeprazole  (PRILOSEC  OTC) 20 MG tablet Take 20 mg by mouth daily.    [provider]  ranolazine  (RANEXA ) 500 MG 12 hr tablet Take 1 tablet (500 mg total) by mouth 2 (two) times daily. 07/18/23   Swinyer, Rosaline HERO, NP      VITAL SIGNS:  Blood pressure 138/80, pulse (!) 108, temperature 98.4 F (36.9 C), temperature source Oral, resp. rate 14, height 5' 6 (1.676 m), weight 99.8 kg, SpO2 96%.  PHYSICAL EXAMINATION:  Physical Exam  GENERAL:  77 y.o.-year-old patient lying in the bed with no acute distress.  EYES: Pupils equal, round, reactive to light and accommodation. No scleral icterus. Extraocular muscles intact.  HEENT: Head atraumatic, normocephalic. Oropharynx and nasopharynx clear.  NECK:  Supple, no jugular venous  distention. No thyroid  enlargement, no tenderness.  LUNGS: Normal breath sounds bilaterally, no wheezing, rales,rhonchi or crepitation. No use of accessory muscles of respiration.  CARDIOVASCULAR: Regular rate and rhythm, S1, S2 normal. No murmurs, rubs, or gallops.  ABDOMEN: Soft, nondistended, nontender. Bowel sounds present. No organomegaly or mass.  EXTREMITIES: No pedal edema, cyanosis, or clubbing.  NEUROLOGIC: Cranial nerves II through XII are intact. Muscle strength 5/5 in all extremities. Sensation intact. Gait not checked.  PSYCHIATRIC: The patient is alert and oriented x 3.  Normal affect and good eye contact. SKIN: No obvious rash, lesion, or ulcer.   LABORATORY PANEL:   CBC Recent Labs  Lab 01/01/24 2213  WBC 6.5  HGB 12.1*  HCT 33.3*  PLT 187   ------------------------------------------------------------------------------------------------------------------  Chemistries  Recent Labs  Lab 01/01/24 2213  NA 128*  K 3.5  CL 92*  CO2 24  GLUCOSE 163*  BUN 15  CREATININE 0.73  CALCIUM  9.1   ------------------------------------------------------------------------------------------------------------------  Cardiac Enzymes No results for input(s): TROPONINI in the last 168 hours. ------------------------------------------------------------------------------------------------------------------  RADIOLOGY:  DG Chest 2 View Result Date: 01/01/2024 CLINICAL DATA:  Chest pain. EXAM: CHEST - 2 VIEW COMPARISON:  None Available. FINDINGS: The heart size and mediastinal contours are within normal limits. Both lungs are clear. The visualized skeletal structures are unremarkable. IMPRESSION: No active cardiopulmonary disease. Electronically Signed   By: Suzen Dials M.D.   On: 01/01/2024 22:42      IMPRESSION AND PLAN:  Assessment and Plan: No notes have been filed under this hospital service. Service: Hospitalist      DVT prophylaxis: Lovenox ***  Advanced  Care Planning:  Code Status: full code***  Family Communication:  The plan of care was discussed in details with the patient (and family). I answered all questions. The patient agreed to proceed with the above mentioned plan. Further management will depend upon hospital course. Disposition Plan: Back to previous home environment Consults called: none***  All the records are reviewed and case discussed with ED provider.  Status is: Inpatient {Inpatient:23812}   At the time of the admission, it appears that the appropriate admission status for this patient is inpatient.  This is judged to be reasonable and necessary in order to provide the required intensity of service to ensure the patient's safety given the presenting symptoms, physical exam findings and initial radiographic and laboratory data in the context of comorbid conditions.  The patient requires inpatient status due to high intensity of service, high risk of further deterioration and high frequency of surveillance required.  I certify that at the time of admission, it is my clinical judgment that the patient will require inpatient hospital care extending more than 2 midnights.                            Dispo: The patient is from: Home              Anticipated d/c is to: Home              Patient currently is not medically stable to d/c.              Difficult to place patient: No  [place] DELENA Peaches M.D on 01/01/2024 at 11:52 PM  [facility]   From 7 PM-7 AM, contact night-coverage [email or web address]  CC: Primary care physician; Sherial Bail, MD

## 2024-01-02 ENCOUNTER — Encounter: Payer: Self-pay | Admitting: Family Medicine

## 2024-01-02 ENCOUNTER — Telehealth (HOSPITAL_COMMUNITY): Payer: Self-pay | Admitting: Pharmacy Technician

## 2024-01-02 ENCOUNTER — Other Ambulatory Visit (HOSPITAL_COMMUNITY): Payer: Self-pay

## 2024-01-02 ENCOUNTER — Ambulatory Visit: Admitting: Cardiology

## 2024-01-02 ENCOUNTER — Encounter
Admission: EM | Disposition: A | Discharge status: Other Acute Inpatient Hospital | Payer: Self-pay | Source: Home / Self Care | Attending: Obstetrics and Gynecology

## 2024-01-02 ENCOUNTER — Telehealth: Payer: Self-pay | Admitting: Cardiology

## 2024-01-02 ENCOUNTER — Other Ambulatory Visit: Payer: Self-pay

## 2024-01-02 ENCOUNTER — Encounter (HOSPITAL_COMMUNITY): Payer: Self-pay

## 2024-01-02 DIAGNOSIS — K219 Gastro-esophageal reflux disease without esophagitis: Secondary | ICD-10-CM | POA: Insufficient documentation

## 2024-01-02 DIAGNOSIS — I25118 Atherosclerotic heart disease of native coronary artery with other forms of angina pectoris: Secondary | ICD-10-CM | POA: Diagnosis not present

## 2024-01-02 DIAGNOSIS — I10 Essential (primary) hypertension: Secondary | ICD-10-CM | POA: Diagnosis not present

## 2024-01-02 DIAGNOSIS — I214 Non-ST elevation (NSTEMI) myocardial infarction: Secondary | ICD-10-CM | POA: Diagnosis not present

## 2024-01-02 DIAGNOSIS — I4891 Unspecified atrial fibrillation: Secondary | ICD-10-CM | POA: Diagnosis not present

## 2024-01-02 DIAGNOSIS — E785 Hyperlipidemia, unspecified: Secondary | ICD-10-CM

## 2024-01-02 DIAGNOSIS — I5030 Unspecified diastolic (congestive) heart failure: Secondary | ICD-10-CM | POA: Diagnosis not present

## 2024-01-02 DIAGNOSIS — E119 Type 2 diabetes mellitus without complications: Secondary | ICD-10-CM

## 2024-01-02 DIAGNOSIS — I2510 Atherosclerotic heart disease of native coronary artery without angina pectoris: Secondary | ICD-10-CM | POA: Insufficient documentation

## 2024-01-02 DIAGNOSIS — I200 Unstable angina: Principal | ICD-10-CM | POA: Diagnosis present

## 2024-01-02 LAB — PROTIME-INR
INR: 1.1 (ref 0.8–1.2)
INR: 1.1 (ref 0.8–1.2)
Prothrombin Time: 14.4 s (ref 11.4–15.2)
Prothrombin Time: 14.6 s (ref 11.4–15.2)

## 2024-01-02 LAB — BASIC METABOLIC PANEL WITH GFR
Anion gap: 13 (ref 5–15)
BUN: 13 mg/dL (ref 8–23)
CO2: 24 mmol/L (ref 22–32)
Calcium: 9.4 mg/dL (ref 8.9–10.3)
Chloride: 96 mmol/L — ABNORMAL LOW (ref 98–111)
Creatinine, Ser: 0.76 mg/dL (ref 0.61–1.24)
GFR, Estimated: >60 mL/min (ref >60)
Glucose, Bld: 141 mg/dL — ABNORMAL HIGH (ref 70–99)
Potassium: 3.7 mmol/L (ref 3.5–5.1)
Sodium: 133 mmol/L — ABNORMAL LOW (ref 135–145)

## 2024-01-02 LAB — APTT: aPTT: 28 s (ref 24–36)

## 2024-01-02 LAB — LIPID PANEL
Cholesterol: 119 mg/dL (ref 0–200)
HDL: 60 mg/dL (ref >40)
LDL Cholesterol: 33 mg/dL (ref 0–99)
Total CHOL/HDL Ratio: 2 ratio
Triglycerides: 132 mg/dL (ref <150)
VLDL: 26 mg/dL (ref 0–40)

## 2024-01-02 LAB — TROPONIN I (HIGH SENSITIVITY)
Troponin I (High Sensitivity): 1199 ng/L (ref <18)
Troponin I (High Sensitivity): 5240 ng/L (ref <18)

## 2024-01-02 LAB — CBC
HCT: 34 % — ABNORMAL LOW (ref 39.0–52.0)
Hemoglobin: 12 g/dL — ABNORMAL LOW (ref 13.0–17.0)
MCH: 37 pg — ABNORMAL HIGH (ref 26.0–34.0)
MCHC: 35.3 g/dL (ref 30.0–36.0)
MCV: 104.9 fL — ABNORMAL HIGH (ref 80.0–100.0)
Platelets: 188 K/uL (ref 150–400)
RBC: 3.24 MIL/uL — ABNORMAL LOW (ref 4.22–5.81)
RDW: 11.8 % (ref 11.5–15.5)
WBC: 6.1 K/uL (ref 4.0–10.5)
nRBC: 0 % (ref 0.0–0.2)

## 2024-01-02 LAB — CBG MONITORING, ED
Glucose-Capillary: 142 mg/dL — ABNORMAL HIGH (ref 70–99)
Glucose-Capillary: 136 mg/dL — ABNORMAL HIGH (ref 70–99)

## 2024-01-02 LAB — GLUCOSE, CAPILLARY: Glucose-Capillary: 120 mg/dL — ABNORMAL HIGH (ref 70–99)

## 2024-01-02 LAB — HEPARIN LEVEL (UNFRACTIONATED): Heparin Unfractionated: 0.28 [IU]/mL — ABNORMAL LOW (ref 0.30–0.70)

## 2024-01-02 LAB — HEMOGLOBIN A1C
Hgb A1c MFr Bld: 6.1 % — ABNORMAL HIGH (ref 4.8–5.6)
Mean Plasma Glucose: 128 mg/dL

## 2024-01-02 SURGERY — LEFT HEART CATH AND CORONARY ANGIOGRAPHY
Anesthesia: Moderate Sedation

## 2024-01-02 MED ORDER — FENTANYL CITRATE (PF) 100 MCG/2ML IJ SOLN
INTRAMUSCULAR | Status: AC
  Filled 2024-01-02: qty 2

## 2024-01-02 MED ORDER — NITROGLYCERIN 1 MG/10 ML FOR IR/CATH LAB
INTRA_ARTERIAL | Status: DC | PRN
  Administered 2024-01-02: 200 ug via INTRACORONARY

## 2024-01-02 MED ORDER — VERAPAMIL HCL 2.5 MG/ML IV SOLN
INTRAVENOUS | Status: DC | PRN
  Administered 2024-01-02: 2.5 mg via INTRA_ARTERIAL

## 2024-01-02 MED ORDER — MIDAZOLAM HCL 2 MG/2ML IJ SOLN
INTRAMUSCULAR | Status: DC | PRN
  Administered 2024-01-02: 1 mg via INTRAVENOUS

## 2024-01-02 MED ORDER — HEPARIN (PORCINE) 25000 UT/250ML-% IV SOLN
1200.0000 [IU]/h | INTRAVENOUS | Status: DC
  Administered 2024-01-02 – 2024-01-03 (×2): 1200 [IU]/h via INTRAVENOUS
  Filled 2024-01-02 (×2): qty 250

## 2024-01-02 MED ORDER — ISOSORBIDE MONONITRATE ER 30 MG PO TB24
30.0000 mg | ORAL_TABLET | Freq: Every day | ORAL | Status: DC
  Administered 2024-01-03: 30 mg via ORAL
  Filled 2024-01-02: qty 1

## 2024-01-02 MED ORDER — HYDRALAZINE HCL 20 MG/ML IJ SOLN
10.0000 mg | INTRAMUSCULAR | Status: AC | PRN
Start: 2024-01-02 — End: 2024-01-02
  Administered 2024-01-02: 10 mg via INTRAVENOUS
  Filled 2024-01-02: qty 1

## 2024-01-02 MED ORDER — RANOLAZINE ER 500 MG PO TB12
500.0000 mg | ORAL_TABLET | Freq: Two times a day (BID) | ORAL | Status: DC
  Administered 2024-01-02 – 2024-01-03 (×2): 500 mg via ORAL
  Filled 2024-01-02 (×3): qty 1

## 2024-01-02 MED ORDER — FENTANYL CITRATE (PF) 100 MCG/2ML IJ SOLN
INTRAMUSCULAR | Status: DC | PRN
  Administered 2024-01-02: 25 ug via INTRAVENOUS

## 2024-01-02 MED ORDER — SODIUM CHLORIDE 0.9 % IV SOLN: INTRAVENOUS | Status: AC

## 2024-01-02 MED ORDER — MAGNESIUM HYDROXIDE 400 MG/5ML PO SUSP: 30.0000 mL | Freq: Every day | ORAL | Status: DC | PRN

## 2024-01-02 MED ORDER — SODIUM CHLORIDE 0.9% FLUSH: 3.0000 mL | INTRAVENOUS | Status: DC | PRN

## 2024-01-02 MED ORDER — MELATONIN 5 MG PO TABS
10.0000 mg | ORAL_TABLET | Freq: Every day | ORAL | Status: DC
  Administered 2024-01-02: 10 mg via ORAL
  Filled 2024-01-02: qty 2

## 2024-01-02 MED ORDER — ASPIRIN 300 MG RE SUPP: 300.0000 mg | RECTAL | Status: AC

## 2024-01-02 MED ORDER — SODIUM CHLORIDE 0.9 % IV SOLN
250.0000 mL | INTRAVENOUS | Status: DC | PRN
Start: 2024-01-02 — End: 2024-01-03

## 2024-01-02 MED ORDER — ALPRAZOLAM 0.25 MG PO TABS
0.2500 mg | ORAL_TABLET | Freq: Every evening | ORAL | Status: DC | PRN
  Administered 2024-01-03: 0.25 mg via ORAL
  Filled 2024-01-02: qty 1

## 2024-01-02 MED ORDER — NITROGLYCERIN 0.4 MG SL SUBL: 0.4000 mg | SUBLINGUAL_TABLET | SUBLINGUAL | Status: DC | PRN

## 2024-01-02 MED ORDER — ASPIRIN 81 MG PO CHEW
324.0000 mg | CHEWABLE_TABLET | ORAL | Status: AC
  Administered 2024-01-02: 324 mg via ORAL
  Filled 2024-01-02: qty 4

## 2024-01-02 MED ORDER — IOHEXOL 300 MG/ML  SOLN
INTRAMUSCULAR | Status: DC | PRN
  Administered 2024-01-02: 70 mL

## 2024-01-02 MED ORDER — HEPARIN SODIUM (PORCINE) 1000 UNIT/ML IJ SOLN
INTRAMUSCULAR | Status: AC
  Filled 2024-01-02: qty 10

## 2024-01-02 MED ORDER — LIDOCAINE HCL 1 % IJ SOLN
INTRAMUSCULAR | Status: AC
  Filled 2024-01-02: qty 20

## 2024-01-02 MED ORDER — SODIUM CHLORIDE 0.9% FLUSH
3.0000 mL | Freq: Two times a day (BID) | INTRAVENOUS | Status: DC
  Administered 2024-01-02 – 2024-01-03 (×2): 3 mL via INTRAVENOUS

## 2024-01-02 MED ORDER — HEPARIN BOLUS VIA INFUSION
1300.0000 [IU] | Freq: Once | INTRAVENOUS | Status: AC
  Administered 2024-01-02: 1300 [IU] via INTRAVENOUS
  Filled 2024-01-02: qty 1300

## 2024-01-02 MED ORDER — ASPIRIN 81 MG PO TBEC
81.0000 mg | DELAYED_RELEASE_TABLET | Freq: Every day | ORAL | Status: DC
  Filled 2024-01-02: qty 1

## 2024-01-02 MED ORDER — AMLODIPINE BESYLATE 5 MG PO TABS: 5.0000 mg | ORAL_TABLET | Freq: Every day | ORAL | Status: DC

## 2024-01-02 MED ORDER — LIDOCAINE HCL (PF) 1 % IJ SOLN
INTRAMUSCULAR | Status: DC | PRN
  Administered 2024-01-02: 2 mL

## 2024-01-02 MED ORDER — TRAZODONE HCL 50 MG PO TABS: 25.0000 mg | ORAL_TABLET | Freq: Every evening | ORAL | Status: DC | PRN

## 2024-01-02 MED ORDER — ASPIRIN 81 MG PO TBEC: 81.0000 mg | DELAYED_RELEASE_TABLET | Freq: Every day | ORAL | Status: DC

## 2024-01-02 MED ORDER — FLUTICASONE PROPIONATE 50 MCG/ACT NA SUSP
2.0000 | Freq: Every day | NASAL | Status: DC
  Administered 2024-01-03: 2 via NASAL
  Filled 2024-01-02 (×2): qty 16

## 2024-01-02 MED ORDER — HEPARIN (PORCINE) IN NACL 1000-0.9 UT/500ML-% IV SOLN
INTRAVENOUS | Status: DC | PRN
  Administered 2024-01-02: 1000 mL

## 2024-01-02 MED ORDER — VERAPAMIL HCL 2.5 MG/ML IV SOLN
INTRAVENOUS | Status: AC
  Filled 2024-01-02: qty 2

## 2024-01-02 MED ORDER — ATORVASTATIN CALCIUM 20 MG PO TABS
80.0000 mg | ORAL_TABLET | Freq: Once | ORAL | Status: AC
  Administered 2024-01-02: 80 mg via ORAL
  Filled 2024-01-02: qty 4

## 2024-01-02 MED ORDER — PANTOPRAZOLE SODIUM 40 MG PO TBEC
40.0000 mg | DELAYED_RELEASE_TABLET | Freq: Two times a day (BID) | ORAL | Status: DC
  Administered 2024-01-02 – 2024-01-03 (×2): 40 mg via ORAL
  Filled 2024-01-02 (×2): qty 1

## 2024-01-02 MED ORDER — VITAMIN B-12 1000 MCG PO TABS
1000.0000 ug | ORAL_TABLET | Freq: Every day | ORAL | Status: DC
  Administered 2024-01-03: 1000 ug via ORAL
  Filled 2024-01-02: qty 1

## 2024-01-02 MED ORDER — ATORVASTATIN CALCIUM 80 MG PO TABS
80.0000 mg | ORAL_TABLET | Freq: Every day | ORAL | Status: DC
  Administered 2024-01-03: 80 mg via ORAL
  Filled 2024-01-02: qty 1

## 2024-01-02 MED ORDER — HEPARIN (PORCINE) IN NACL 1000-0.9 UT/500ML-% IV SOLN
INTRAVENOUS | Status: AC
  Filled 2024-01-02: qty 1000

## 2024-01-02 MED ORDER — FUROSEMIDE 20 MG PO TABS
20.0000 mg | ORAL_TABLET | Freq: Every day | ORAL | Status: DC
  Administered 2024-01-03: 20 mg via ORAL
  Filled 2024-01-02: qty 1

## 2024-01-02 MED ORDER — HEPARIN SODIUM (PORCINE) 1000 UNIT/ML IJ SOLN
INTRAMUSCULAR | Status: DC | PRN
  Administered 2024-01-02: 5000 [IU] via INTRAVENOUS

## 2024-01-02 MED ORDER — ASPIRIN 81 MG PO CHEW: 81.0000 mg | CHEWABLE_TABLET | ORAL | Status: DC

## 2024-01-02 MED ORDER — LOSARTAN POTASSIUM 50 MG PO TABS
50.0000 mg | ORAL_TABLET | Freq: Every day | ORAL | Status: DC
  Administered 2024-01-03: 50 mg via ORAL
  Filled 2024-01-02: qty 1

## 2024-01-02 MED ORDER — ACETAMINOPHEN 325 MG PO TABS
650.0000 mg | ORAL_TABLET | ORAL | Status: DC | PRN
Start: 2024-01-02 — End: 2024-01-03

## 2024-01-02 MED ORDER — LABETALOL HCL 5 MG/ML IV SOLN: 10.0000 mg | INTRAVENOUS | Status: AC | PRN

## 2024-01-02 MED ORDER — SODIUM CHLORIDE 0.9 % WEIGHT BASED INFUSION: 1.0000 mL/kg/h | INTRAVENOUS | Status: DC

## 2024-01-02 MED ORDER — ONDANSETRON HCL 4 MG/2ML IJ SOLN: 4.0000 mg | Freq: Four times a day (QID) | INTRAMUSCULAR | Status: DC | PRN

## 2024-01-02 MED ORDER — INSULIN ASPART 100 UNIT/ML IJ SOLN: 0.0000 [IU] | Freq: Every day | INTRAMUSCULAR | Status: DC

## 2024-01-02 MED ORDER — VITAMIN D3 25 MCG (1000 UNIT) PO TABS
1000.0000 [IU] | ORAL_TABLET | Freq: Every day | ORAL | Status: DC
  Administered 2024-01-03: 1000 [IU] via ORAL
  Filled 2024-01-02 (×3): qty 1

## 2024-01-02 MED ORDER — SODIUM CHLORIDE 0.9 % WEIGHT BASED INFUSION
3.0000 mL/kg/h | INTRAVENOUS | Status: DC
  Administered 2024-01-02: 3 mL/kg/h via INTRAVENOUS

## 2024-01-02 MED ORDER — INSULIN ASPART 100 UNIT/ML IJ SOLN
0.0000 [IU] | Freq: Three times a day (TID) | INTRAMUSCULAR | Status: DC
  Administered 2024-01-02 (×2): 1 [IU] via SUBCUTANEOUS
  Administered 2024-01-03: 2 [IU] via SUBCUTANEOUS
  Filled 2024-01-02 (×3): qty 1

## 2024-01-02 MED ORDER — METOPROLOL TARTRATE 50 MG PO TABS
50.0000 mg | ORAL_TABLET | Freq: Once | ORAL | Status: AC
  Administered 2024-01-02: 50 mg via ORAL
  Filled 2024-01-02: qty 1

## 2024-01-02 MED ORDER — MIDAZOLAM HCL 2 MG/2ML IJ SOLN
INTRAMUSCULAR | Status: AC
  Filled 2024-01-02: qty 2

## 2024-01-02 MED ORDER — NITROGLYCERIN 1 MG/10 ML FOR IR/CATH LAB
INTRA_ARTERIAL | Status: AC
  Filled 2024-01-02: qty 10

## 2024-01-02 MED ORDER — METOPROLOL TARTRATE 50 MG PO TABS
50.0000 mg | ORAL_TABLET | Freq: Two times a day (BID) | ORAL | Status: DC
  Administered 2024-01-02 – 2024-01-03 (×2): 50 mg via ORAL
  Filled 2024-01-02 (×2): qty 1

## 2024-01-02 SURGICAL SUPPLY — 9 items
CATH 5F 110X4 TIG (CATHETERS) IMPLANT
CATH INFINITI 5 FR JL3.5 (CATHETERS) IMPLANT
DEVICE RAD TR BAND REGULAR (VASCULAR PRODUCTS) IMPLANT
DRAPE BRACHIAL (DRAPES) IMPLANT
GLIDESHEATH SLEND SS 6F .021 (SHEATH) IMPLANT
GUIDEWIRE INQWIRE 1.5J.035X260 (WIRE) IMPLANT
PACK CARDIAC CATH (CUSTOM PROCEDURE TRAY) ×1 IMPLANT
SET ATX-X65L (MISCELLANEOUS) IMPLANT
STATION PROTECTION PRESSURIZED (MISCELLANEOUS) IMPLANT

## 2024-01-02 NOTE — Assessment & Plan Note (Signed)
-   Will continue Xanax .

## 2024-01-02 NOTE — H&P (Signed)
 Cardiology Admission History and Physical   Patient ID: Daniel Holden MRN: [id number]; DOB: Jul 18, 1945   Admission date: 01/01/2024  PCP:  Sherial Bail, MD    HeartCare Providers Cardiologist:  Wilbert Bihari, MD       Chief Complaint:  chest pain  Patient Profile: Daniel Holden is a 78 y.o. male with history of CAD, hypertension, hepatic steatosis, hyperlipidemia, aortic atherosclerosis, anxiety, cardiac valve disease who is being seen 01/02/2024 for the evaluation of NSTEMI and New onset Afib.  History of Present Illness: Daniel Holden was referred to cardiology in 2022 for chest pain.  Coronary CTA showed aortic atherosclerosis with very high coronary calcium  score of 4630 with severely calcified RCA and LAD greater than 70% and 50 to 69% proximal to mid left circumflex.  Cardiac cath in April 2022 showed severe single-vessel disease with 100% heavily calcified RCA stenosis, extensive calcification with multiple lesions including 90% proximal and distal prior to the bifurcation, heavily calcified proximal LAD and left circumflex with 50% ostial left circumflex and 40% proximal LAD, left circumflex diffuse moderate disease most notably 50 to 60% in the distal OM, normal LVEF, there were no PCI targets.  Echo in 2022 showed mild MR and mild AI.  The patient was started on Imdur  and subsequently Ranexa  for persistent chronic angina.  Echo in 2023 showed normal EF 55 to 60%, no wall motion abnormalities, grade 1 diastolic dysfunction, normal RV.  He was seen in the office 08/2023 reporting worsening shortness of breath/unstable angina, no changes were made.  The patient presented to [facility] 01/01/2024 with chest pain.  He reported intermittent chest pain over the last 3 days.  The day before coming in he had an episode that lasted about 3 hours that did not get better with rest.  He also checked his pulse and noted that it was irregular.  EMS was called who administered aspirin  and  nitroglycerin  with improvement of pain.  He was found to be in new onset A-fib with rates 90s to 100s.  In the ER labs showed sodium 128, potassium 3.5, serum creatinine 0.37, BUN 15, hemoglobin 12.1.  High-sensitivity troponin 125> 1199.  EKG showed new onset A-fib 121 bpm.  Chest x-ray was nonacute.  The patient was started on IV heparin  and given IV Lasix .  When patient was seen in the ER at [facility] he was back in normal sinus rhythm.  The patient was taken for left heart cath later that day which showed severe multivessel CAD and was transferred to [facility] for CABG evaluation.  Past Medical History:  Diagnosis Date   Allergy    Angina of effort (HCC)    Anxiety    CAD (coronary artery disease), native coronary artery    coronary CTA showing aortic atherosclerosis with very high coronary Ca score at 4630 with severely calcified RCA and LAD > 70% and 50-69% prox to mid LCx.     Cancer (HCC)    Carpal tunnel syndrome    GERD (gastroesophageal reflux disease)    HLD (hyperlipidemia)    Hypertension    Lung nodule    Obesity    Pancreatitis 2015   Sleep apnea    Past Surgical History:  Procedure Laterality Date   CARPAL TUNNEL RELEASE     COLONOSCOPY WITH PROPOFOL  N/A 06/27/2021   Procedure: COLONOSCOPY WITH PROPOFOL ;  Surgeon: Maryruth Ole DASEN, MD;  Location: [facility] ENDOSCOPY;  Service: Endoscopy;  Laterality: N/A;   ESOPHAGOGASTRODUODENOSCOPY (EGD) WITH PROPOFOL  N/A 06/27/2021  Procedure: ESOPHAGOGASTRODUODENOSCOPY (EGD) WITH PROPOFOL ;  Surgeon: Maryruth Ole DASEN, MD;  Location: [facility] ENDOSCOPY;  Service: Endoscopy;  Laterality: N/A;   ETHMOIDECTOMY Right 03/20/2021   Procedure: ANTERIOR ETHMOIDECTOMY;  Surgeon: Blair Mt, MD;  Location: [facility] ORS;  Service: ENT;  Laterality: Right;   IMAGE GUIDED SINUS SURGERY N/A 03/20/2021   Procedure: IMAGE GUIDED SINUS SURGERY;  Surgeon: Blair Mt, MD;  Location: [facility] ORS;  Service: ENT;  Laterality: N/A;   KNEE ARTHROSCOPY WITH MEDIAL  MENISECTOMY Left 06/25/2019   Procedure: KNEE ARTHROSCOPY WITH MEDIAL MENISECTOMY;  Surgeon: Marchia Drivers, MD;  Location: [facility] ORS;  Service: Orthopedics;  Laterality: Left;   LEFT HEART CATH AND CORONARY ANGIOGRAPHY N/A 10/04/2020   Procedure: LEFT HEART CATH AND CORONARY ANGIOGRAPHY;  Surgeon: Anner Alm ORN, MD;  Location: [facility] INVASIVE CV LAB;  Service: Cardiovascular;  Laterality: N/A;   MAXILLARY ANTROSTOMY Right 03/20/2021   Procedure: MAXILLARY ANTROSTOMY WITH TISSUE REMOVAL;  Surgeon: Blair Mt, MD;  Location: [facility] ORS;  Service: ENT;  Laterality: Right;   SHOULDER SURGERY     TONSILLECTOMY       Medications Prior to Admission: Prior to Admission medications   Medication Sig Start Date End Date Taking? Authorizing Provider  Acetaminophen  (TYLENOL ) 325 MG CAPS Take by mouth. As needed    [provider]  ALPRAZolam  (XANAX ) 0.25 MG tablet Take 1 tablet (0.25 mg total) by mouth at bedtime as needed for sleep. 04/24/21   Britta King, MD  amLODipine  (NORVASC ) 5 MG tablet TAKE 1 TABLET EVERY DAY 05/30/23   Shlomo Wilbert SAUNDERS, MD  aspirin  81 MG EC tablet Take 81 mg by mouth daily.    [provider]  atorvastatin  (LIPITOR) 80 MG tablet TAKE 1 TABLET EVERY DAY 05/28/23   Turner, Wilbert SAUNDERS, MD  Cholecalciferol  250 MCG (10000 UT) TABS Take 1,000 Units by mouth daily.    [provider]  cyanocobalamin  1000 MCG tablet Take 1,000 mcg by mouth daily.    [provider]  fluticasone  (FLONASE ) 50 MCG/ACT nasal spray Place 2 sprays into both nostrils daily.  10/27/13   [provider]  furosemide  (LASIX ) 20 MG tablet Take 1 tablet (20 mg total) by mouth daily. 03/05/23   Shlomo Wilbert SAUNDERS, MD  isosorbide  mononitrate (IMDUR ) 30 MG 24 hr tablet TAKE 1 TABLET EVERY DAY 05/28/23   Turner, Wilbert SAUNDERS, MD  losartan  (COZAAR ) 50 MG tablet TAKE 1 TABLET EVERY DAY 09/04/23   Shlomo Wilbert SAUNDERS, MD  melatonin 3 MG TABS tablet Take 10 mg by mouth at bedtime.    [provider]  metoprolol  tartrate (LOPRESSOR ) 50 MG tablet TAKE 1 TABLET TWICE DAILY 05/28/23   Shlomo Wilbert SAUNDERS, MD  naproxen  sodium (ALEVE ) 220 MG tablet Take 1 tablet (220 mg total) by mouth 2 (two) times daily as needed. 03/23/21   Antoinette Doe, MD  omeprazole  (PRILOSEC  OTC) 20 MG tablet Take 20 mg by mouth daily.    [provider]  ranolazine  (RANEXA ) 500 MG 12 hr tablet Take 1 tablet (500 mg total) by mouth 2 (two) times daily. 07/18/23   Swinyer, Rosaline HERO, NP     Allergies:    Allergies  Allergen Reactions   Metronidazole  Other (See Comments)    Patient stated that he had a moderate level of pain with his allergic reaction.    Ace Inhibitors Cough   Dilaudid [Hydromorphone Hcl] Nausea And Vomiting    Social History:   Social History   Socioeconomic History  Marital status: Married    Spouse name: Not on file   Number of children: Not on file   Years of education: Not on file   Highest education level: Not on file  Occupational History   Not on file  Tobacco Use   Smoking status: Never   Smokeless tobacco: Never  Vaping Use   Vaping status: Never Used  Substance and Sexual Activity   Alcohol use: Yes    Alcohol/week: 42.0 standard drinks of alcohol    Types: 42 Cans of beer per week    Comment: 6 per day   Drug use: No   Sexual activity: Not on file  Other Topics Concern   Not on file  Social History Narrative   Not on file   Social Drivers of Health   Financial Resource Strain: Low Risk  (12/23/2023)   Received from [facility]   Overall Financial Resource Strain (CARDIA)    Difficulty of Paying Living Expenses: Not hard at all  Food Insecurity: No Food Insecurity (12/23/2023)   Received from [facility]   Hunger Vital Sign    Within the past 12 months, you worried that your food would run out before you got the money to buy more.: Never true    Within the past 12 months, the food you bought just didn't last  and you didn't have money to get more.: Never true  Transportation Needs: No Transportation Needs (12/23/2023)   Received from [facility] - Transportation    In the past 12 months, has lack of transportation kept you from medical appointments or from getting medications?: No    Lack of Transportation (Non-Medical): No  Physical Activity: Not on file  Stress: Not on file  Social Connections: Not on file  Intimate Partner Violence: Not on file     Family History:   The patient's family history includes CVA in his father.    ROS:  Please see the history of present illness.  All other ROS reviewed and negative.     Physical Exam/Data: Vitals:   01/02/24 1153 01/02/24 1407 01/02/24 1411 01/02/24 1550  BP:  (!) 181/68 (!) 162/75   Pulse:  66    Resp:  15    Temp: 98.2 F (36.8 C) 97.6 F (36.4 C)    TempSrc: Oral Temporal    SpO2:  96%  96%  Weight:  97.1 kg    Height:  5' 6 (1.676 m)      Intake/Output Summary (Last 24 hours) at 01/02/2024 1650 Last data filed at 01/02/2024 0836 Gross per 24 hour  Intake 505.83 ml  Output --  Net 505.83 ml      01/02/2024    2:07 PM 01/01/2024   10:06 PM 08/26/2023    8:44 AM  Last 3 Weights  Weight (lbs) 214 lb 220 lb 210 lb 9.6 oz  Weight (kg) 97.07 kg 99.791 kg 95.528 kg     Body mass index is 34.54 kg/m.  General:  Well nourished, well developed, in no acute distress HEENT: normal Neck: no JVD Vascular: No carotid bruits; Distal pulses 2+ bilaterally   Cardiac:  normal S1, S2; RRR; no murmur  Lungs:  clear to auscultation bilaterally, no wheezing, rhonchi or rales  Abd: soft, nontender, no hepatomegaly  Ext: no edema Musculoskeletal:  No deformities, BUE and BLE strength normal and equal Skin: warm and dry  Neuro:  CNs 2-12 intact, no focal abnormalities  noted Psych:  Normal affect   EKG:  The ECG that was done 01/01/24 was personally reviewed and demonstrates Afib 121bpm, nonspecific ST/T wave  changes  Relevant CV Studies:  LHC 01/02/24:   Mid LM to Prox LAD lesion is 20% stenosed with 40% stenosed side branch in Ost Cx.   Prox LAD lesion is 95% stenosed. Dist LAD lesion is 40% stenosed.   Ost Cx to Prox Cx lesion is 90% stenosed.  Mid Cx lesion is 50% stenosed.  Mid Cx to Dist Cx lesion is 60% stenosed with 50% stenosed side branch in 3rd Mrg.   Prox RCA to Dist RCA lesion is 100% stenosed with 100% stenosed side branch in RPAV.   LV end diastolic pressure is mildly elevated.   There is no aortic valve stenosis.   Anticipated discharge date to be determined.   Multivessel CAD-recommend CABG x 3.  Patient be transferred to [facility] for CVTS consultation. Continue to titrate GDMT for CAD   on 01/02/2024.   Recommend concurrent antiplatelet therapy of Aspirin  81 mg daily for 6 months.   Restart IV Heparin  for ACS/Afib --for coverage until CABG.   Recommendations  Antiplatelet/Anticoag on 01/02/2024. Recommend concurrent antiplatelet therapy of Aspirin  81 mg daily for 6 months. Restart IV Heparin  for ACS/Afib --for coverage until CABG.  Discharge Date Anticipated discharge date to be determined. Multivessel CAD-recommend CABG x 3.  Patient be transferred to [facility] for CVTS consultation. Continue to titrate GDMT for CAD   Coronary Diagrams  Diagnostic Dominance: Right   Echo 2023   1. Left ventricular ejection fraction, by estimation, is 55 to 60%. The  left ventricle has normal function. The left ventricle has no regional  wall motion abnormalities. Left ventricular diastolic parameters are  consistent with Grade I diastolic  dysfunction (impaired relaxation). The average left ventricular global  longitudinal strain is -19.9 %.   2. Right ventricular systolic function is normal. The right ventricular  size is normal.   3. Left atrial size was moderately dilated.   4. The mitral valve is normal in structure. Mild mitral valve  regurgitation. No  evidence of mitral stenosis.   5. The aortic valve was not well visualized. Aortic valve regurgitation  is mild. Aortic valve sclerosis is present, with no evidence of aortic  valve stenosis.   6. The inferior vena cava is normal in size with greater than 50%  respiratory variability, suggesting right atrial pressure of 3 mmHg.   Laboratory Data: High Sensitivity Troponin:   Recent Labs  Lab 01/01/24 2213 01/02/24 0004 01/02/24 0825  TROPONINIHS 125* 1,199* 5,240*      Chemistry Recent Labs  Lab 01/01/24 2213 01/02/24 0426  NA 128* 133*  K 3.5 3.7  CL 92* 96*  CO2 24 24  GLUCOSE 163* 141*  BUN 15 13  CREATININE 0.73 0.76  CALCIUM  9.1 9.4  GFRNONAA >60 >60  ANIONGAP 12 13    No results for input(s): PROT, ALBUMIN, AST, ALT, ALKPHOS, BILITOT in the last 168 hours. Lipids  Recent Labs  Lab 01/02/24 0426  CHOL 119  TRIG 132  HDL 60  LDLCALC 33  CHOLHDL 2.0   Hematology Recent Labs  Lab 01/01/24 2213 01/02/24 0426  WBC 6.5 6.1  RBC 3.18* 3.24*  HGB 12.1* 12.0*  HCT 33.3* 34.0*  MCV 104.7* 104.9*  MCH 38.1* 37.0*  MCHC 36.3* 35.3  RDW 11.6 11.8  PLT 187 188   Thyroid  No results for input(s): TSH,  FREET4 in the last 168 hours. BNPNo results for input(s): BNP, PROBNP in the last 168 hours.  DDimer No results for input(s): DDIMER in the last 168 hours.  Radiology/Studies:  DG Chest 2 View Result Date: 01/01/2024 CLINICAL DATA:  Chest pain. EXAM: CHEST - 2 VIEW COMPARISON:  None Available. FINDINGS: The heart size and mediastinal contours are within normal limits. Both lungs are clear. The visualized skeletal structures are unremarkable. IMPRESSION: No active cardiopulmonary disease. Electronically Signed   By: Suzen Dials M.D.   On: 01/01/2024 22:42     Assessment and Plan:  NSTEMI Multivessel CAD - presented with chest pain found to have elevated troponin and new onset A-fib started on IV heparin  - Left heart cath showed  severe multivessel CAD, plan to transfer to [facility] for CABG evaluation - Can restart IV heparin  2 hours after TR band removal - Continue aspirin  81 mg daily, Lipitor 80 mg daily, Imdur  30 mg daily, Ranexa  500 mg twice daily, Lopressor  50 mg twice daily - check echo  New onset Afib with RVR - presented with new onset Afib with rates 90-100s, converted to NSR in the ER - continue metoprolol  50mg  BID for rate control - TSH normal - Keep Mag>2 and K>4 - CHADSVASC of 5. He will need long-term a/c with Eliquis, continue IV heparin  for now  HTN - Imdur  30mg  daily - Losartan  50mg  daily - Lopressor  50mg  BID  HFpEF - PTA lasix  20mg  daily - Echo 2023 showed EF 55 to 60%, grade 1 diastolic dysfunction, mild MR, mild AI -Repeat echo as above -Patient appears euvolemic  HLD - LDL 33 - Continue Lipitor 80 mg daily  Risk Assessment/Risk Scores:    TIMI Risk Score for Unstable Angina or Non-ST Elevation MI:   The patient's TIMI risk score is 6, which indicates a 41% risk of all cause mortality, new or recurrent myocardial infarction or need for urgent revascularization in the next 14 days.{       CHA2DS2-VASc Score = 5   This indicates a 7.2% annual risk of stroke. The patient's score is based upon: CHF History: 1 HTN History: 1 Diabetes History: 0 Stroke History: 0 Vascular Disease History: 1 Age Score: 2 Gender Score: 0  Code Status: Full Code  Severity of Illness: The appropriate patient status for this patient is INPATIENT. Inpatient status is judged to be reasonable and necessary in order to provide the required intensity of service to ensure the patient's safety. The patient's presenting symptoms, physical exam findings, and initial radiographic and laboratory data in the context of their chronic comorbidities is felt to place them at high risk for further clinical deterioration. Furthermore, it is not anticipated that the patient will be medically stable for discharge from  the hospital within 2 midnights of admission.   * I certify that at the point of admission it is my clinical judgment that the patient will require inpatient hospital care spanning beyond 2 midnights from the point of admission due to high intensity of service, high risk for further deterioration and high frequency of surveillance required.*  For questions or updates, please contact [facility] Please consult [email or web address] for contact info under     Signed, Timira Bieda VEAR Fishman, PA-C  01/02/2024 5:22 PM

## 2024-01-02 NOTE — Discharge Summary (Addendum)
 Daniel Holden FMW:[id number] DOB: 1946-04-13 DOA: 01/01/2024  PCP: Sherial Bail, MD  Admit date: 01/01/2024 Discharge date: 01/02/2024  Time spent: 35 minutes     Discharge Diagnoses:  Principal Problem:   NSTEMI (non-ST elevated myocardial infarction) [facility]) Active Problems:   Atrial fibrillation, new onset (HCC)   Essential hypertension   Dyslipidemia   OSA on CPAP   Anxiety   Primary hypertension   Type 2 diabetes mellitus without complications (HCC)   GERD without esophagitis   CAD (coronary artery disease)   Discharge Condition: stable  Diet recommendation: heart healthy  Filed Weights   01/01/24 2206 01/02/24 1407  Weight: 99.8 kg 97.1 kg    History of present illness:  From admission h and p Daniel Holden is a 78 y.o. Caucasian male with medical history significant for anxiety, coronary artery disease, GERD, hypertension, dyslipidemia, and pancreatitis as well as OSA, who presented to the ER with acute onset of left parasternal chest pressure that was intense and was graded 9-10/10 in severity with radiation to his left arm without nausea or vomiting or diaphoresis.  He admitted to dyspnea before his chest pain but did not any palpitations.  No fever or chills.  No cough or wheezing or hemoptysis.  No leg pain or edema or recent travels or surgeries.  No dysuria, oliguria or hematuria or flank pain.  No bleeding diathesis.   Hospital Course:   Hx CAD presenting with chest pain, found to have nstemi, started on heparin , on hospital day 1 chest pain resolved, taken for LHC, showing multivessel CAD-recommend CABG x 3. Patient be transferred to [facility] for CVTS consultation. Echocardiogram ordered here but hasn't yet been completed. Also found to be in new a-fib rvr on arrival, has converted to sinus. Other chronic conditions stable  Procedures: Left heart cath   Consultations: cardiology  Discharge Exam: Vitals:   01/02/24 1411 01/02/24 1550   BP: (!) 162/75   Pulse:    Resp:    Temp:    SpO2:  96%    General: NAD Cardiovascular: RRR, distant heart sounds Respiratory: CTAB  Discharge Instructions   Discharge Instructions     Diet - low sodium heart healthy   Complete by: As directed    Increase activity slowly   Complete by: As directed       Allergies as of 01/02/2024       Reactions   Metronidazole  Other (See Comments)   Patient stated that he had a moderate level of pain with his allergic reaction.    Ace Inhibitors Cough   Dilaudid [hydromorphone Hcl] Nausea And Vomiting        Medication List     PAUSE taking these medications    amLODipine  5 MG tablet Wait to take this until your doctor or other care provider tells you to start again. Commonly known as: NORVASC  TAKE 1 TABLET EVERY DAY       STOP taking these medications    naproxen  sodium 220 MG tablet Commonly known as: ALEVE    Tylenol  325 MG Caps Generic drug: Acetaminophen        TAKE these medications    ALPRAZolam  0.25 MG tablet Commonly known as: XANAX  Take 1 tablet (0.25 mg total) by mouth at bedtime as needed for sleep.   aspirin  EC 81 MG tablet Take 81 mg by mouth daily.   atorvastatin  80 MG tablet Commonly known as: LIPITOR TAKE 1 TABLET EVERY DAY   Cholecalciferol  250 MCG (10000 UT)  Tabs Take 1,000 Units by mouth daily.   cyanocobalamin  1000 MCG tablet Take 1,000 mcg by mouth daily.   fluticasone  50 MCG/ACT nasal spray Commonly known as: FLONASE  Place 2 sprays into both nostrils daily.   furosemide  20 MG tablet Commonly known as: LASIX  Take 1 tablet (20 mg total) by mouth daily.   isosorbide  mononitrate 30 MG 24 hr tablet Commonly known as: IMDUR  TAKE 1 TABLET EVERY DAY   losartan  50 MG tablet Commonly known as: COZAAR  TAKE 1 TABLET EVERY DAY   melatonin 3 MG Tabs tablet Take 10 mg by mouth at bedtime.   metoprolol  tartrate 50 MG tablet Commonly known as: LOPRESSOR  TAKE 1 TABLET TWICE DAILY    omeprazole  20 MG tablet Commonly known as: PRILOSEC  OTC Take 20 mg by mouth daily.   ranolazine  500 MG 12 hr tablet Commonly known as: Ranexa  Take 1 tablet (500 mg total) by mouth 2 (two) times daily.       Allergies  Allergen Reactions   Metronidazole  Other (See Comments)    Patient stated that he had a moderate level of pain with his allergic reaction.    Ace Inhibitors Cough   Dilaudid [Hydromorphone Hcl] Nausea And Vomiting      The results of significant diagnostics from this hospitalization (including imaging, microbiology, ancillary and laboratory) are listed below for reference.    Significant Diagnostic Studies: DG Chest 2 View Result Date: 01/01/2024 CLINICAL DATA:  Chest pain. EXAM: CHEST - 2 VIEW COMPARISON:  None Available. FINDINGS: The heart size and mediastinal contours are within normal limits. Both lungs are clear. The visualized skeletal structures are unremarkable. IMPRESSION: No active cardiopulmonary disease. Electronically Signed   By: Suzen Dials M.D.   On: 01/01/2024 22:42    Microbiology: No results found for this or any previous visit (from the past 240 hours).   Labs: Basic Metabolic Panel: Recent Labs  Lab 01/01/24 2213 01/02/24 0426  NA 128* 133*  K 3.5 3.7  CL 92* 96*  CO2 24 24  GLUCOSE 163* 141*  BUN 15 13  CREATININE 0.73 0.76  CALCIUM  9.1 9.4   Liver Function Tests: No results for input(s): AST, ALT, ALKPHOS, BILITOT, PROT, ALBUMIN in the last 168 hours. No results for input(s): LIPASE, AMYLASE in the last 168 hours. No results for input(s): AMMONIA in the last 168 hours. CBC: Recent Labs  Lab 01/01/24 2213 01/02/24 0426  WBC 6.5 6.1  HGB 12.1* 12.0*  HCT 33.3* 34.0*  MCV 104.7* 104.9*  PLT 187 188   Cardiac Enzymes: No results for input(s): CKTOTAL, CKMB, CKMBINDEX, TROPONINI in the last 168 hours. BNP: BNP (last 3 results) No results for input(s): BNP in the last 8760  hours.  ProBNP (last 3 results) No results for input(s): PROBNP in the last 8760 hours.  CBG: Recent Labs  Lab 01/02/24 0722 01/02/24 1202  GLUCAP 142* 136*       Signed:  Devaughn KATHEE Ban MD.  [facility] 01/02/2024, 4:50 PM

## 2024-01-02 NOTE — ED Notes (Signed)
 Verbal orders from Dr Lawence to give meteorology 50 mg once and atorvastatin  80 mg once STAT. Patient may be going for a heart catheterization in the morning. Patient is now NPO.

## 2024-01-02 NOTE — Telephone Encounter (Signed)
 Patient is in the hospital, wanted to make dr turner aware. He as schedule for today to come in to see dr Pietro. Please advise

## 2024-01-02 NOTE — Assessment & Plan Note (Signed)
-   Continue statin therapy and check fasting lipids.

## 2024-01-02 NOTE — Consult Note (Signed)
 Cardiology Consultation   Patient ID: [name] MRN: [id number]; DOB: [birth year]  Admit date: 01/01/2024 Date of Consult: 01/02/2024  PCP:  Sherial Bail, MD   [facility] Providers Cardiologist:  Wilbert Bihari, MD      Patient Profile: Daniel Holden is a 78 y.o. male with a hx of CAD, hypertension, hepatic steatosis, hyperlipidemia, aortic atherosclerosis, anxiety, cardiac valve disease who is being seen 01/02/2024 for the evaluation of NSTEMI and new onset Afib at the request of Dr. Kandis.  History of Present Illness: [name] was referred to cardiology in 2022 for chest pain.  Coronary CTA showed aortic atherosclerosis with very high coronary calcium  score of 4630 with severely calcified RCA and LAD greater than 70% and 50 to 69% proximal to mid left circumflex.  Cardiac cath April 2022 showed severe single-vessel disease with 100% heavily calcified RCA stenosis, extensive calcification with multiple lesions including 90% proximal and distal prior to the bifurcation, heavily calcified proximal LAD and left circumflex with 50% ostial left circumflex and 40% proximal LAD, left circumflex diffuse moderate disease most notably 50 to 60% distal OM, normal LVEF, no targets for PCI.  Echo in 2022 showed mild MR and mild AI.  The patient was started on Imdur  and subsequently Ranexa  for persistent chronic angina.  Echo in 2023 showed normal EF 55 to 60%, no wall motion abnormalities, grade 1 diastolic dysfunction, normal RV.  Patient was seen 3/ 2025 with increased shortness of breath/US . No changes were made.   The patient presented to the ER 01/01/2024 with chest pain.  He reported intermittent chest pain for the last 3 days. The day before coming in he had an episode that lasted for about 3 hours and did not get better with rest. He checked his pulse and noted that it was irregular. EMS was called who administered aspirin  and nitro with improvement of pain.  He was found to be  in new onset A-fib with rates 90s to 100s.  In the ER blood pressure 156/99, pulse rate 98 bpm, respiratory rate 18, afebrile.  Labs showed sodium 128, K3.5, scr 0.73, BUN 15, chloride 92, hemoglobin 12.1.  High-sensitivity troponin 125, 1199.  EKG showed Afib, 121bpm. Chest x-ray nonacute.  Patient was given IV Lasix  and started on IV heparin .  Patient was started on beta-blocker therapy for rate control.  On interview, he is back in NSR.   Past Medical History:  Diagnosis Date   Allergy    Angina of effort (HCC)    Anxiety    CAD (coronary artery disease), native coronary artery    coronary CTA showing aortic atherosclerosis with very high coronary Ca score at 4630 with severely calcified RCA and LAD > 70% and 50-69% prox to mid LCx.     Cancer (HCC)    Carpal tunnel syndrome    GERD (gastroesophageal reflux disease)    HLD (hyperlipidemia)    Hypertension    Lung nodule    Obesity    Pancreatitis 2015   Sleep apnea     Past Surgical History:  Procedure Laterality Date   CARPAL TUNNEL RELEASE     COLONOSCOPY WITH PROPOFOL  N/A 06/27/2021   Procedure: COLONOSCOPY WITH PROPOFOL ;  Surgeon: Maryruth Ole DASEN, MD;  Location: [facility];  Service: Endoscopy;  Laterality: N/A;   ESOPHAGOGASTRODUODENOSCOPY (EGD) WITH PROPOFOL  N/A 06/27/2021   Procedure: ESOPHAGOGASTRODUODENOSCOPY (EGD) WITH PROPOFOL ;  Surgeon: Maryruth Ole DASEN, MD;  Location: [facility];  Service: Endoscopy;  Laterality: N/A;  ETHMOIDECTOMY Right 03/20/2021   Procedure: ANTERIOR ETHMOIDECTOMY;  Surgeon: Blair Mt, MD;  Location: [facility] ORS;  Service: ENT;  Laterality: Right;   IMAGE GUIDED SINUS SURGERY N/A 03/20/2021   Procedure: IMAGE GUIDED SINUS SURGERY;  Surgeon: Blair Mt, MD;  Location: [facility] ORS;  Service: ENT;  Laterality: N/A;   KNEE ARTHROSCOPY WITH MEDIAL MENISECTOMY Left 06/25/2019   Procedure: KNEE ARTHROSCOPY WITH MEDIAL MENISECTOMY;  Surgeon: Marchia Drivers, MD;  Location: [facility] OR[facility];   Service: Orthopedics;  Laterality: Left;   LEFT HEART CATH AND CORONARY ANGIOGRAPHY N/A 10/04/2020   Procedure: LEFT HEART CATH AND CORONARY ANGIOGRAPHY;  Surgeon: Anner Alm ORN, MD;  Location: [facility] INVASIVE CV LAB;  Service: Cardiovascular;  Laterality: N/A;   MAXILLARY ANTROSTOMY Right 03/20/2021   Procedure: MAXILLARY ANTROSTOMY WITH TISSUE REMOVAL;  Surgeon: Blair Mt, MD;  Location: [facility] ORS;  Service: ENT;  Laterality: Right;   SHOULDER SURGERY     TONSILLECTOMY       Home Medications:  Prior to Admission medications   Medication Sig Start Date End Date Taking? Authorizing Provider  Acetaminophen  (TYLENOL ) 325 MG CAPS Take by mouth. As needed    [provider]  ALPRAZolam  (XANAX ) 0.25 MG tablet Take 1 tablet (0.25 mg total) by mouth at bedtime as needed for sleep. 04/24/21   Britta King, MD  amLODipine  (NORVASC ) 5 MG tablet TAKE 1 TABLET EVERY DAY 05/30/23   Shlomo Wilbert SAUNDERS, MD  aspirin  81 MG EC tablet Take 81 mg by mouth daily.    [provider]  atorvastatin  (LIPITOR) 80 MG tablet TAKE 1 TABLET EVERY DAY 05/28/23   Turner, Wilbert SAUNDERS, MD  Cholecalciferol  250 MCG (10000 UT) TABS Take 1,000 Units by mouth daily.    [provider]  cyanocobalamin  1000 MCG tablet Take 1,000 mcg by mouth daily.    [provider]  fluticasone  (FLONASE ) 50 MCG/ACT nasal spray Place 2 sprays into both nostrils daily.  10/27/13   [provider]  furosemide  (LASIX ) 20 MG tablet Take 1 tablet (20 mg total) by mouth daily. 03/05/23   Shlomo Wilbert SAUNDERS, MD  isosorbide  mononitrate (IMDUR ) 30 MG 24 hr tablet TAKE 1 TABLET EVERY DAY 05/28/23   Turner, Wilbert SAUNDERS, MD  losartan  (COZAAR ) 50 MG tablet TAKE 1 TABLET EVERY DAY 09/04/23   Shlomo Wilbert SAUNDERS, MD  melatonin 3 MG TABS tablet Take 10 mg by mouth at bedtime.    [provider]  metoprolol  tartrate (LOPRESSOR ) 50 MG tablet TAKE 1 TABLET TWICE DAILY 05/28/23   Shlomo Wilbert SAUNDERS, MD  naproxen  sodium (ALEVE ) 220 MG  tablet Take 1 tablet (220 mg total) by mouth 2 (two) times daily as needed. 03/23/21   Antoinette Doe, MD  omeprazole  (PRILOSEC  OTC) 20 MG tablet Take 20 mg by mouth daily.    [provider]  ranolazine  (RANEXA ) 500 MG 12 hr tablet Take 1 tablet (500 mg total) by mouth 2 (two) times daily. 07/18/23   Swinyer, Rosaline HERO, NP    Scheduled Meds:  amLODipine   5 mg Oral Daily   aspirin  EC  81 mg Oral Daily   [START ON 01/03/2024] atorvastatin   80 mg Oral Daily   cholecalciferol   1,000 Units Oral Daily   cyanocobalamin   1,000 mcg Oral Daily   fluticasone   2 spray Each Nare Daily   furosemide   20 mg Oral Daily   insulin  aspart  0-5 Units Subcutaneous QHS   insulin  aspart  0-9 Units Subcutaneous TID WC   isosorbide  mononitrate  30 mg Oral Daily   losartan   50 mg Oral Daily   melatonin  10 mg Oral QHS   metoprolol  tartrate  50 mg Oral BID   pantoprazole   40 mg Oral BID   ranolazine   500 mg Oral BID   Continuous Infusions:  sodium chloride  100 mL/hr at 01/02/24 0823   heparin  1,000 Units/hr (01/02/24 0756)   PRN Meds: acetaminophen , ALPRAZolam , magnesium  hydroxide, nitroGLYCERIN , ondansetron  (ZOFRAN ) IV, traZODone   Allergies:    Allergies  Allergen Reactions   Metronidazole  Other (See Comments)    Patient stated that he had a moderate level of pain with his allergic reaction.    Ace Inhibitors Cough   Dilaudid [Hydromorphone Hcl] Nausea And Vomiting    Social History:   Social History   Socioeconomic History   Marital status: Married    Spouse name: Not on file   Number of children: Not on file   Years of education: Not on file   Highest education level: Not on file  Occupational History   Not on file  Tobacco Use   Smoking status: Never   Smokeless tobacco: Never  Vaping Use   Vaping status: Never Used  Substance and Sexual Activity   Alcohol use: Yes    Alcohol/week: 2.0 standard drinks of alcohol    Types: 2 Cans of beer per week    Comment: 6 per day    Drug use: No   Sexual activity: Not on file  Other Topics Concern   Not on file  Social History Narrative   Not on file   Social Drivers of Health   Financial Resource Strain: Low Risk  (12/23/2023)   Received from [facility]   Overall Financial Resource Strain (CARDIA)    Difficulty of Paying Living Expenses: Not hard at all  Food Insecurity: No Food Insecurity (12/23/2023)   Received from [facility]   Hunger Vital Sign    Within the past 12 months, you worried that your food would run out before you got the money to buy more.: Never true    Within the past 12 months, the food you bought just didn't last and you didn't have money to get more.: Never true  Transportation Needs: No Transportation Needs (12/23/2023)   Received from [facility] - Transportation    In the past 12 months, has lack of transportation kept you from medical appointments or from getting medications?: No    Lack of Transportation (Non-Medical): No  Physical Activity: Not on file  Stress: Not on file  Social Connections: Not on file  Intimate Partner Violence: Not on file    Family History:    Family History  Problem Relation Age of Onset   CVA Father      ROS:  Please see the history of present illness.   All other ROS reviewed and negative.     Physical Exam/Data: Vitals:   01/02/24 0600 01/02/24 0628 01/02/24 0700 01/02/24 0701  BP: 124/73  (!) 147/74   Pulse: (!) 58 (!) 58 62   Resp: 15  18   Temp:    98.3 F (36.8 C)  TempSrc:    Oral  SpO2: 97%  98%   Weight:      Height:        Intake/Output Summary (Last 24 hours) at 01/02/2024 0832 Last data filed at 01/02/2024 0823 Gross per 24 hour  Intake 486.26 ml  Output --  Net  486.26 ml      01/01/2024   10:06 PM 08/26/2023    8:44 AM 07/18/2023    8:56 AM  Last 3 Weights  Weight (lbs) 220 lb 210 lb 9.6 oz 210 lb  Weight (kg) 99.791 kg 95.528 kg 95.255 kg     Body mass  index is 35.51 kg/m.  General:  Well nourished, well developed, in no acute distress HEENT: normal Neck: no JVD Vascular: No carotid bruits; Distal pulses 2+ bilaterally Cardiac:  normal S1, S2; RRR; no murmur  Lungs:  clear to auscultation bilaterally, no wheezing, rhonchi or rales  Abd: soft, nontender, no hepatomegaly  Ext: no edema Musculoskeletal:  No deformities, BUE and BLE strength normal and equal Skin: warm and dry  Neuro:  CNs 2-12 intact, no focal abnormalities noted Psych:  Normal affect   EKG:  The EKG was personally reviewed and demonstrates:  Afib, 121bpm, minimal ST depression V3-V5 Telemetry:  Telemetry was personally reviewed and demonstrates:  Afib 120s>NSR 50-60s around 4AM on 7/10  Relevant CV Studies:  Echo 2023  1. Left ventricular ejection fraction, by estimation, is 55 to 60%. The  left ventricle has normal function. The left ventricle has no regional  wall motion abnormalities. Left ventricular diastolic parameters are  consistent with Grade I diastolic  dysfunction (impaired relaxation). The average left ventricular global  longitudinal strain is -19.9 %.   2. Right ventricular systolic function is normal. The right ventricular  size is normal.   3. Left atrial size was moderately dilated.   4. The mitral valve is normal in structure. Mild mitral valve  regurgitation. No evidence of mitral stenosis.   5. The aortic valve was not well visualized. Aortic valve regurgitation  is mild. Aortic valve sclerosis is present, with no evidence of aortic  valve stenosis.   6. The inferior vena cava is normal in size with greater than 50%  respiratory variability, suggesting right atrial pressure of 3 mmHg.   LHC 2022 Prox RCA lesion is 95% stenosed. Mid RCA to Dist RCA lesion is 100% stenosed. Dist RCA lesion is 90% stenosed with 80% stenosed side branch in RPAV. Dist LM to Prox LAD lesion is 20% stenosed with 50% stenosed side branch in Ost Cx. Prox LAD  lesion is 40% stenosed. Dist LAD lesion is 40% stenosed. Mid Cx lesion is 40% stenosed. Dist Cx lesion is 40% stenosed with 50% stenosed side branch in 3rd Mrg. ------------------------------------------------- The left ventricular systolic function is normal. The left ventricular ejection fraction is 55-65% by visual estimate. LV end diastolic pressure is mildly elevated. There is no aortic valve stenosis.   SUMMARY Severe single-vessel CAD with 100% heavily calcified RCA stenosis, there are extensive calcification, multiple different lesions noted including 90% proximal and distal just prior to bifurcation (seen via retrograde flow);  heavily calcified proximal LAD and LCx with 50% ostial LCx and 40% proximal LAD.  The LCx has diffuse moderate disease most notably 50 to 60% of the distal OM.   LAD has a tandem system with the major branch being a large septal perforator branch that courses in the direction of the LAD should.  The large diagonal branch actually is the vessel that reaches the apex.  Both bridging collaterals from the septal perforator and able collaterals from the diagonal branch provide collaterals to the PDA.   Hemodynamics: Preserved EF of 55 to 60%.  Cannot exclude mild inferior hypokinesis.  LVEDP 16 mmHg   RECOMMENDATIONS No obvious culprit lesions for  PCI, recommend optimize medical management. I will start amlodipine  2.5 mg daily along with Ranexa  500 mg p.o. twice daily Follow-up with Dr. Shlomo      Laboratory Data: High Sensitivity Troponin:   Recent Labs  Lab 01/01/24 2213 01/02/24 0004  TROPONINIHS 125* 1,199*     Chemistry Recent Labs  Lab 01/01/24 2213 01/02/24 0426  NA 128* 133*  K 3.5 3.7  CL 92* 96*  CO2 24 24  GLUCOSE 163* 141*  BUN 15 13  CREATININE 0.73 0.76  CALCIUM  9.1 9.4  GFRNONAA >60 >60  ANIONGAP 12 13    No results for input(s): PROT, ALBUMIN, AST, ALT, ALKPHOS, BILITOT in the last 168 hours. Lipids  Recent  Labs  Lab 01/02/24 0426  CHOL 119  TRIG 132  HDL 60  LDLCALC 33  CHOLHDL 2.0    Hematology Recent Labs  Lab 01/01/24 2213 01/02/24 0426  WBC 6.5 6.1  RBC 3.18* 3.24*  HGB 12.1* 12.0*  HCT 33.3* 34.0*  MCV 104.7* 104.9*  MCH 38.1* 37.0*  MCHC 36.3* 35.3  RDW 11.6 11.8  PLT 187 188   Thyroid  No results for input(s): TSH, FREET4 in the last 168 hours.  BNPNo results for input(s): BNP, PROBNP in the last 168 hours.  DDimer No results for input(s): DDIMER in the last 168 hours.  Radiology/Studies:  DG Chest 2 View Result Date: 01/01/2024 CLINICAL DATA:  Chest pain. EXAM: CHEST - 2 VIEW COMPARISON:  None Available. FINDINGS: The heart size and mediastinal contours are within normal limits. Both lungs are clear. The visualized skeletal structures are unremarkable. IMPRESSION: No active cardiopulmonary disease. Electronically Signed   By: Suzen Dials M.D.   On: 01/01/2024 22:42     Assessment and Plan:  NSTEMI CAD - patient presented with chest pain found to have new onset Afib and elevated troponin - HS troponin 125>1199. Will check one this AM - started on IV heparin  - he is back in NSR - LHC in 2022 with CAD but no PCI targets (report above) - continue ASA 81mg  daily - Imdur  30mg  daily, Ranexa  500mg  BID,Lipitor 80mg  daily - check echo - will need left heart catheterization Risks and benefits of cardiac catheterization have been discussed with the patient.  These include bleeding, infection, kidney damage, stroke, heart attack, death.  The patient understands these risks and is willing to proceed.   New onset Afib - presented with new onset Afib with reasonable rates now in NSR HR 60s (converted around 4AM) - continued on metoprolol  50mg  BID - recent normal TSH - keep Mag>2 and K>4 - check echo - CHADSVASC 5. He will need long-term a/c with Eliquis, continue IV heparin  for now  HTN - Bps good - amlodipine  5mg  daily - Imdur  30mg  daily - losartan   50mg  daily - Lopressor  50mg  BID  HFpEF - lasix  20mg  daily - check echo as above - echo in 2023 showed LVEF 55-60%, G1DD, mild MR, mild AI - he appears euvolemic  HLD - LDL 33 - continue Lipitor 80mg  daily   Risk Assessment/Risk Scores:    TIMI Risk Score for Unstable Angina or Non-ST Elevation MI:   The patient's TIMI risk score is 6, which indicates a 41% risk of all cause mortality, new or recurrent myocardial infarction or need for urgent revascularization in the next 14 days.{     CHA2DS2-VASc Score = 5   This indicates a 7.2% annual risk of stroke. The patient's score is based upon: CHF History: 1 HTN  History: 1 Diabetes History: 0 Stroke History: 0 Vascular Disease History: 1 Age Score: 2 Gender Score: 0        For questions or updates, please contact [facility] Please consult [email or web address] for contact info under    Signed, Travontae Freiberger VEAR Fishman, PA-C  01/02/2024 8:32 AM

## 2024-01-02 NOTE — ED Notes (Signed)
 Patient denies chest pain at this pain.

## 2024-01-02 NOTE — Assessment & Plan Note (Signed)
 Will continue PPI therapy.

## 2024-01-02 NOTE — Assessment & Plan Note (Addendum)
-   The patient will be admitted to a progressive unit bed. - Will follow serial troponins and EKGs. - The patient will be placed on aspirin  as well as p.r.n. sublingual nitroglycerin  and morphine  sulfate for pain. - We will obtain a cardiology consult in a.m. for further cardiac risk stratification. - I notified [facility] group about the patient. - Will continue IV heparin  drip. - Will continue high-dose statin therapy and beta-blocker therapy as well as Ranexa . - Fasting lipids will be obtained.

## 2024-01-02 NOTE — Progress Notes (Signed)
 ANTICOAGULATION CONSULT NOTE  Pharmacy Consult for heparin  infusion Indication: ACS/STEMI  Allergies  Allergen Reactions   Metronidazole  Other (See Comments)    Patient stated that he had a moderate level of pain with his allergic reaction.    Ace Inhibitors Cough   Dilaudid [Hydromorphone Hcl] Nausea And Vomiting    Patient Measurements: Height: 5' 6 (167.6 cm) Weight: 99.8 kg (220 lb) IBW/kg (Calculated) : 63.8 HEPARIN  DW (KG): 85.8  Vital Signs: Temp: 98.3 F (36.8 C) (07/10 0701) Temp Source: Oral (07/10 0701) BP: 147/74 (07/10 0700) Pulse Rate: 62 (07/10 0700)  Labs: Recent Labs    01/01/24 2213 01/02/24 0004 01/02/24 0426 01/02/24 0734  HGB 12.1*  --  12.0*  --   HCT 33.3*  --  34.0*  --   PLT 187  --  188  --   APTT  --  28  --   --   LABPROT  --  14.4 14.6  --   INR  --  1.1 1.1  --   HEPARINUNFRC  --   --   --  0.28*  CREATININE 0.73  --  0.76  --   TROPONINIHS 125* 1,199*  --   --     Estimated Creatinine Clearance: 88.2 mL/min (by C-G formula based on SCr of 0.76 mg/dL).   Medical History: Past Medical History:  Diagnosis Date   Allergy    Angina of effort (HCC)    Anxiety    CAD (coronary artery disease), native coronary artery    coronary CTA showing aortic atherosclerosis with very high coronary Ca score at 4630 with severely calcified RCA and LAD > 70% and 50-69% prox to mid LCx.     Cancer (HCC)    Carpal tunnel syndrome    GERD (gastroesophageal reflux disease)    HLD (hyperlipidemia)    Hypertension    Lung nodule    Obesity    Pancreatitis 2015   Sleep apnea     Assessment: Pt is a 78 yo male presenting to ED c/o chest pain found with elevated Troponin I level.  Goal of Therapy:  Heparin  level 0.3-0.7 units/ml Monitor platelets by anticoagulation protocol: Yes   7/10@0734 : HL 0.28, subtherapeutic@1000  units/hr  Plan:  Bolus 1300 units x 1 Increase heparin  infusion at 1200 units/hr Will check HL in 8 hr after rate  infusion CBC daily while on heparin   Shabnam Ladd A Leaman Abe, PharmD Clinical Pharmacist 01/02/2024 8:15 AM

## 2024-01-02 NOTE — H&P (View-Only) (Signed)
 Cardiology Consultation   Patient ID: Daniel Holden MRN: [id number]; DOB: [birth year]  Admit date: 01/01/2024 Date of Consult: 01/02/2024  PCP:  Daniel Bail, MD   [facility] Providers Cardiologist:  Wilbert Bihari, MD      Patient Profile: Daniel Holden is a 78 y.o. male with a hx of CAD, hypertension, hepatic steatosis, hyperlipidemia, aortic atherosclerosis, anxiety, cardiac valve disease who is being seen 01/02/2024 for the evaluation of NSTEMI and new onset Afib at the request of Dr. Kandis.  History of Present Illness: Daniel Holden was referred to cardiology in 2022 for chest pain.  Coronary CTA showed aortic atherosclerosis with very high coronary calcium  score of 4630 with severely calcified RCA and LAD greater than 70% and 50 to 69% proximal to mid left circumflex.  Cardiac cath April 2022 showed severe single-vessel disease with 100% heavily calcified RCA stenosis, extensive calcification with multiple lesions including 90% proximal and distal prior to the bifurcation, heavily calcified proximal LAD and left circumflex with 50% ostial left circumflex and 40% proximal LAD, left circumflex diffuse moderate disease most notably 50 to 60% distal OM, normal LVEF, Holden targets for PCI.  Echo in 2022 showed mild MR and mild AI.  The patient was started on Imdur  and subsequently Ranexa  for persistent chronic angina.  Echo in 2023 showed normal EF 55 to 60%, Holden wall motion abnormalities, grade 1 diastolic dysfunction, normal RV.  Patient was seen 3/ 2025 with increased shortness of breath/US . Holden changes were made.   The patient presented to the ER 01/01/2024 with chest pain.  He reported intermittent chest pain for the last 3 days. The day before coming in he had an episode that lasted for about 3 hours and did not get better with rest. He checked his pulse and noted that it was irregular. EMS was called who administered aspirin  and nitro with improvement of pain.  He was found to be  in new onset A-fib with rates 90s to 100s.  In the ER blood pressure 156/99, pulse rate 98 bpm, respiratory rate 18, afebrile.  Labs showed sodium 128, K3.5, scr 0.73, BUN 15, chloride 92, hemoglobin 12.1.  High-sensitivity troponin 125, 1199.  EKG showed Afib, 121bpm. Chest x-ray nonacute.  Patient was given IV Lasix  and started on IV heparin .  Patient was started on beta-blocker therapy for rate control.  On interview, he is back in NSR.   Past Medical History:  Diagnosis Date   Allergy    Angina of effort (HCC)    Anxiety    CAD (coronary artery disease), native coronary artery    coronary CTA showing aortic atherosclerosis with very high coronary Ca score at 4630 with severely calcified RCA and LAD > 70% and 50-69% prox to mid LCx.     Cancer (HCC)    Carpal tunnel syndrome    GERD (gastroesophageal reflux disease)    HLD (hyperlipidemia)    Hypertension    Lung nodule    Obesity    Pancreatitis 2015   Sleep apnea     Past Surgical History:  Procedure Laterality Date   CARPAL TUNNEL RELEASE     COLONOSCOPY WITH PROPOFOL  N/A 06/27/2021   Procedure: COLONOSCOPY WITH PROPOFOL ;  Surgeon: Maryruth Ole DASEN, MD;  Location: [facility];  Service: Endoscopy;  Laterality: N/A;   ESOPHAGOGASTRODUODENOSCOPY (EGD) WITH PROPOFOL  N/A 06/27/2021   Procedure: ESOPHAGOGASTRODUODENOSCOPY (EGD) WITH PROPOFOL ;  Surgeon: Maryruth Ole DASEN, MD;  Location: [facility] END[facility];  Service: Endoscopy;  Laterality: N/A;  ETHMOIDECTOMY Right 03/20/2021   Procedure: ANTERIOR ETHMOIDECTOMY;  Surgeon: Blair Mt, MD;  Location: [facility] ORS;  Service: ENT;  Laterality: Right;   IMAGE GUIDED SINUS SURGERY N/A 03/20/2021   Procedure: IMAGE GUIDED SINUS SURGERY;  Surgeon: Blair Mt, MD;  Location: [facility] ORS;  Service: ENT;  Laterality: N/A;   KNEE ARTHROSCOPY WITH MEDIAL MENISECTOMY Left 06/25/2019   Procedure: KNEE ARTHROSCOPY WITH MEDIAL MENISECTOMY;  Surgeon: Marchia Drivers, MD;  Location: [facility] ORS;   Service: Orthopedics;  Laterality: Left;   LEFT HEART CATH AND CORONARY ANGIOGRAPHY N/A 10/04/2020   Procedure: LEFT HEART CATH AND CORONARY ANGIOGRAPHY;  Surgeon: Anner Alm ORN, MD;  Location: [facility] INVASIVE CV LAB;  Service: Cardiovascular;  Laterality: N/A;   MAXILLARY ANTROSTOMY Right 03/20/2021   Procedure: MAXILLARY ANTROSTOMY WITH TISSUE REMOVAL;  Surgeon: Blair Mt, MD;  Location: [facility] ORS;  Service: ENT;  Laterality: Right;   SHOULDER SURGERY     TONSILLECTOMY       Home Medications:  Prior to Admission medications   Medication Sig Start Date End Date Taking? Authorizing Provider  Acetaminophen  (TYLENOL ) 325 MG CAPS Take by mouth. As needed    [provider]  ALPRAZolam  (XANAX ) 0.25 MG tablet Take 1 tablet (0.25 mg total) by mouth at bedtime as needed for sleep. 04/24/21   Britta King, MD  amLODipine  (NORVASC ) 5 MG tablet TAKE 1 TABLET EVERY DAY 05/30/23   Shlomo Wilbert SAUNDERS, MD  aspirin  81 MG EC tablet Take 81 mg by mouth daily.    [provider]  atorvastatin  (LIPITOR) 80 MG tablet TAKE 1 TABLET EVERY DAY 05/28/23   Turner, Wilbert SAUNDERS, MD  Cholecalciferol  250 MCG (10000 UT) TABS Take 1,000 Units by mouth daily.    [provider]  cyanocobalamin  1000 MCG tablet Take 1,000 mcg by mouth daily.    [provider]  fluticasone  (FLONASE ) 50 MCG/ACT nasal spray Place 2 sprays into both nostrils daily.  10/27/13   [provider]  furosemide  (LASIX ) 20 MG tablet Take 1 tablet (20 mg total) by mouth daily. 03/05/23   Shlomo Wilbert SAUNDERS, MD  isosorbide  mononitrate (IMDUR ) 30 MG 24 hr tablet TAKE 1 TABLET EVERY DAY 05/28/23   Turner, Wilbert SAUNDERS, MD  losartan  (COZAAR ) 50 MG tablet TAKE 1 TABLET EVERY DAY 09/04/23   Shlomo Wilbert SAUNDERS, MD  melatonin 3 MG TABS tablet Take 10 mg by mouth at bedtime.    [provider]  metoprolol  tartrate (LOPRESSOR ) 50 MG tablet TAKE 1 TABLET TWICE DAILY 05/28/23   Shlomo Wilbert SAUNDERS, MD  naproxen  sodium (ALEVE ) 220 MG  tablet Take 1 tablet (220 mg total) by mouth 2 (two) times daily as needed. 03/23/21   Antoinette Doe, MD  omeprazole  (PRILOSEC  OTC) 20 MG tablet Take 20 mg by mouth daily.    [provider]  ranolazine  (RANEXA ) 500 MG 12 hr tablet Take 1 tablet (500 mg total) by mouth 2 (two) times daily. 07/18/23   Swinyer, Rosaline HERO, NP    Scheduled Meds:  amLODipine   5 mg Oral Daily   aspirin  EC  81 mg Oral Daily   [START ON 01/03/2024] atorvastatin   80 mg Oral Daily   cholecalciferol   1,000 Units Oral Daily   cyanocobalamin   1,000 mcg Oral Daily   fluticasone   2 spray Each Nare Daily   furosemide   20 mg Oral Daily   insulin  aspart  0-5 Units Subcutaneous QHS   insulin  aspart  0-9 Units Subcutaneous TID WC   isosorbide  mononitrate  30 mg Oral Daily   losartan   50 mg Oral Daily   melatonin  10 mg Oral QHS   metoprolol  tartrate  50 mg Oral BID   pantoprazole   40 mg Oral BID   ranolazine   500 mg Oral BID   Continuous Infusions:  sodium chloride  100 mL/hr at 01/02/24 0823   heparin  1,000 Units/hr (01/02/24 0756)   PRN Meds: acetaminophen , ALPRAZolam , magnesium  hydroxide, nitroGLYCERIN , ondansetron  (ZOFRAN ) IV, traZODone   Allergies:    Allergies  Allergen Reactions   Metronidazole  Other (See Comments)    Patient stated that he had a moderate level of pain with his allergic reaction.    Ace Inhibitors Cough   Dilaudid [Hydromorphone Hcl] Nausea And Vomiting    Social History:   Social History   Socioeconomic History   Marital status: Married    Spouse name: Not on file   Number of children: Not on file   Years of education: Not on file   Highest education level: Not on file  Occupational History   Not on file  Tobacco Use   Smoking status: Never   Smokeless tobacco: Never  Vaping Use   Vaping status: Never Used  Substance and Sexual Activity   Alcohol use: Yes    Alcohol/week: 2.0 standard drinks of alcohol    Types: 2 Cans of beer per week    Comment: 6 per day    Drug use: Holden   Sexual activity: Not on file  Other Topics Concern   Not on file  Social History Narrative   Not on file   Social Drivers of Health   Financial Resource Strain: Low Risk  (12/23/2023)   Received from [facility]   Overall Financial Resource Strain (CARDIA)    Difficulty of Paying Living Expenses: Not hard at all  Food Insecurity: Holden Food Insecurity (12/23/2023)   Received from [facility]   Hunger Vital Sign    Within the past 12 months, you worried that your food would run out before you got the money to buy more.: Never true    Within the past 12 months, the food you bought just didn't last and you didn't have money to get more.: Never true  Transportation Needs: Holden Transportation Needs (12/23/2023)   Received from [facility] - Transportation    In the past 12 months, has lack of transportation kept you from medical appointments or from getting medications?: Holden    Lack of Transportation (Non-Medical): Holden  Physical Activity: Not on file  Stress: Not on file  Social Connections: Not on file  Intimate Partner Violence: Not on file    Family History:    Family History  Problem Relation Age of Onset   CVA Father      ROS:  Please see the history of present illness.   All other ROS reviewed and negative.     Physical Exam/Data: Vitals:   01/02/24 0600 01/02/24 0628 01/02/24 0700 01/02/24 0701  BP: 124/73  (!) 147/74   Pulse: (!) 58 (!) 58 62   Resp: 15  18   Temp:    98.3 F (36.8 C)  TempSrc:    Oral  SpO2: 97%  98%   Weight:      Height:        Intake/Output Summary (Last 24 hours) at 01/02/2024 0832 Last data filed at 01/02/2024 0823 Gross per 24 hour  Intake 486.26 ml  Output --  Net  486.26 ml      01/01/2024   10:06 PM 08/26/2023    8:44 AM 07/18/2023    8:56 AM  Last 3 Weights  Weight (lbs) 220 lb 210 lb 9.6 oz 210 lb  Weight (kg) 99.791 kg 95.528 kg 95.255 kg     Body mass  index is 35.51 kg/m.  General:  Well nourished, well developed, in Holden acute distress HEENT: normal Neck: Holden JVD Vascular: Holden carotid bruits; Distal pulses 2+ bilaterally Cardiac:  normal S1, S2; RRR; Holden murmur  Lungs:  clear to auscultation bilaterally, Holden wheezing, rhonchi or rales  Abd: soft, nontender, Holden hepatomegaly  Ext: Holden edema Musculoskeletal:  Holden deformities, BUE and BLE strength normal and equal Skin: warm and dry  Neuro:  CNs 2-12 intact, Holden focal abnormalities noted Psych:  Normal affect   EKG:  The EKG was personally reviewed and demonstrates:  Afib, 121bpm, minimal ST depression V3-V5 Telemetry:  Telemetry was personally reviewed and demonstrates:  Afib 120s>NSR 50-60s around 4AM on 7/10  Relevant CV Studies:  Echo 2023  1. Left ventricular ejection fraction, by estimation, is 55 to 60%. The  left ventricle has normal function. The left ventricle has Holden regional  wall motion abnormalities. Left ventricular diastolic parameters are  consistent with Grade I diastolic  dysfunction (impaired relaxation). The average left ventricular global  longitudinal strain is -19.9 %.   2. Right ventricular systolic function is normal. The right ventricular  size is normal.   3. Left atrial size was moderately dilated.   4. The mitral valve is normal in structure. Mild mitral valve  regurgitation. Holden evidence of mitral stenosis.   5. The aortic valve was not well visualized. Aortic valve regurgitation  is mild. Aortic valve sclerosis is present, with Holden evidence of aortic  valve stenosis.   6. The inferior vena cava is normal in size with greater than 50%  respiratory variability, suggesting right atrial pressure of 3 mmHg.   LHC 2022 Prox RCA lesion is 95% stenosed. Mid RCA to Dist RCA lesion is 100% stenosed. Dist RCA lesion is 90% stenosed with 80% stenosed side branch in RPAV. Dist LM to Prox LAD lesion is 20% stenosed with 50% stenosed side branch in Ost Cx. Prox LAD  lesion is 40% stenosed. Dist LAD lesion is 40% stenosed. Mid Cx lesion is 40% stenosed. Dist Cx lesion is 40% stenosed with 50% stenosed side branch in 3rd Mrg. ------------------------------------------------- The left ventricular systolic function is normal. The left ventricular ejection fraction is 55-65% by visual estimate. LV end diastolic pressure is mildly elevated. There is Holden aortic valve stenosis.   SUMMARY Severe single-vessel CAD with 100% heavily calcified RCA stenosis, there are extensive calcification, multiple different lesions noted including 90% proximal and distal just prior to bifurcation (seen via retrograde flow);  heavily calcified proximal LAD and LCx with 50% ostial LCx and 40% proximal LAD.  The LCx has diffuse moderate disease most notably 50 to 60% of the distal OM.   LAD has a tandem system with the major branch being a large septal perforator branch that courses in the direction of the LAD should.  The large diagonal branch actually is the vessel that reaches the apex.  Both bridging collaterals from the septal perforator and able collaterals from the diagonal branch provide collaterals to the PDA.   Hemodynamics: Preserved EF of 55 to 60%.  Cannot exclude mild inferior hypokinesis.  LVEDP 16 mmHg   RECOMMENDATIONS Holden obvious culprit lesions for  PCI, recommend optimize medical management. I will start amlodipine  2.5 mg daily along with Ranexa  500 mg p.o. twice daily Follow-up with Dr. Shlomo      Laboratory Data: High Sensitivity Troponin:   Recent Labs  Lab 01/01/24 2213 01/02/24 0004  TROPONINIHS 125* 1,199*     Chemistry Recent Labs  Lab 01/01/24 2213 01/02/24 0426  NA 128* 133*  K 3.5 3.7  CL 92* 96*  CO2 24 24  GLUCOSE 163* 141*  BUN 15 13  CREATININE 0.73 0.76  CALCIUM  9.1 9.4  GFRNONAA >60 >60  ANIONGAP 12 13    Holden results for input(s): PROT, ALBUMIN, AST, ALT, ALKPHOS, BILITOT in the last 168 hours. Lipids  Recent  Labs  Lab 01/02/24 0426  CHOL 119  TRIG 132  HDL 60  LDLCALC 33  CHOLHDL 2.0    Hematology Recent Labs  Lab 01/01/24 2213 01/02/24 0426  WBC 6.5 6.1  RBC 3.18* 3.24*  HGB 12.1* 12.0*  HCT 33.3* 34.0*  MCV 104.7* 104.9*  MCH 38.1* 37.0*  MCHC 36.3* 35.3  RDW 11.6 11.8  PLT 187 188   Thyroid  Holden results for input(s): TSH, FREET4 in the last 168 hours.  BNPNo results for input(s): BNP, PROBNP in the last 168 hours.  DDimer Holden results for input(s): DDIMER in the last 168 hours.  Radiology/Studies:  DG Chest 2 View Result Date: 01/01/2024 CLINICAL DATA:  Chest pain. EXAM: CHEST - 2 VIEW COMPARISON:  None Available. FINDINGS: The heart size and mediastinal contours are within normal limits. Both lungs are clear. The visualized skeletal structures are unremarkable. IMPRESSION: Holden active cardiopulmonary disease. Electronically Signed   By: Suzen Dials M.D.   On: 01/01/2024 22:42     Assessment and Plan:  NSTEMI CAD - patient presented with chest pain found to have new onset Afib and elevated troponin - HS troponin 125>1199. Will check one this AM - started on IV heparin  - he is back in NSR - LHC in 2022 with CAD but Holden PCI targets (report above) - continue ASA 81mg  daily - Imdur  30mg  daily, Ranexa  500mg  BID,Lipitor 80mg  daily - check echo - will need left heart catheterization Risks and benefits of cardiac catheterization have been discussed with the patient.  These include bleeding, infection, kidney damage, stroke, heart attack, death.  The patient understands these risks and is willing to proceed.   New onset Afib - presented with new onset Afib with reasonable rates now in NSR HR 60s (converted around 4AM) - continued on metoprolol  50mg  BID - recent normal TSH - keep Mag>2 and K>4 - check echo - CHADSVASC 5. He will need long-term a/c with Eliquis, continue IV heparin  for now  HTN - Bps good - amlodipine  5mg  daily - Imdur  30mg  daily - losartan   50mg  daily - Lopressor  50mg  BID  HFpEF - lasix  20mg  daily - check echo as above - echo in 2023 showed LVEF 55-60%, G1DD, mild MR, mild AI - he appears euvolemic  HLD - LDL 33 - continue Lipitor 80mg  daily   Risk Assessment/Risk Scores:    TIMI Risk Score for Unstable Angina or Non-ST Elevation MI:   The patient's TIMI risk score is 6, which indicates a 41% risk of all cause mortality, new or recurrent myocardial infarction or need for urgent revascularization in the next 14 days.{     CHA2DS2-VASc Score = 5   This indicates a 7.2% annual risk of stroke. The patient's score is based upon: CHF History: 1 HTN  History: 1 Diabetes History: 0 Stroke History: 0 Vascular Disease History: 1 Age Score: 2 Gender Score: 0        For questions or updates, please contact [facility] Please consult [email or web address] for contact info under    Signed, Travontae Freiberger VEAR Fishman, PA-C  01/02/2024 8:32 AM

## 2024-01-02 NOTE — ED Notes (Signed)
 Cardiology at bedside.

## 2024-01-02 NOTE — ED Notes (Signed)
 Pt assisted onto the in-room toilet to have bowel movement. He was instructed to press his call light when finished.

## 2024-01-02 NOTE — Assessment & Plan Note (Signed)
-   The patient will be placed on supplemental coverage with NovoLog. - Will hold off metformin.

## 2024-01-02 NOTE — Progress Notes (Addendum)
 PROGRESS NOTE    Daniel Holden  FMW:[id number] DOB: March 29, 1946 DOA: 01/01/2024 PCP: Sherial Bail, MD  Outpatient Specialists: cardiology    Brief Narrative:   From admission h and p  Daniel Holden is a 78 y.o. Caucasian male with medical history significant for anxiety, coronary artery disease, GERD, hypertension, dyslipidemia, and pancreatitis as well as OSA, who presented to the ER with acute onset of left parasternal chest pressure that was intense and was graded 9-10/10 in severity with radiation to his left arm without nausea or vomiting or diaphoresis.  He admitted to dyspnea before his chest pain but did not any palpitations.  No fever or chills.  No cough or wheezing or hemoptysis.  No leg pain or edema or recent travels or surgeries.  No dysuria, oliguria or hematuria or flank pain.  No bleeding diathesis.    Assessment & Plan:   Principal Problem:   NSTEMI (non-ST elevated myocardial infarction) (HCC) Active Problems:   Atrial fibrillation with rapid ventricular response (HCC)   Essential hypertension   Dyslipidemia   OSA on CPAP   Anxiety   Primary hypertension   Type 2 diabetes mellitus without complications (HCC)   GERD without esophagitis   CAD (coronary artery disease)  # NSTEMI Chest pain free currently. Trops to 5k. Hx CAD, no culprit lesion on last cath. Received asa. LDL 33.  - cardiology following, recs appreciated - continue heparin  - cont home statin, imdur  - for LHC later today - f/u tte  # A-fib with rvr New diagnosis, likely mediated by ACS. Has converted to sinus rhythm. Tsh wnl 2 wks ago. Treated osa as below. Doesn't appear to have chf exacerbation. - heparin  as above - rate/rhythm control strategy per cardiology, currently on home metop - tte  # HTN Here mild elevation - continue home amlodipine , losartan   # T2DM Euglycemic - ssi  # OSA - cpap at bedtime  # Obesity Noted  # GAD - home xanax  prn   DVT prophylaxis:  iv heparin  Code Status: full Family Communication: wife at bedside 7/10  Level of care: Progressive Status is: Inpatient Remains inpatient appropriate because: severity of illness    Consultants:  cardiology  Procedures: LHC pending  Antimicrobials:  none    Subjective: Chest pain resolved, no dyspnea  Objective: Vitals:   01/02/24 0600 01/02/24 0628 01/02/24 0700 01/02/24 0701  BP: 124/73  (!) 147/74   Pulse: (!) 58 (!) 58 62   Resp: 15  18   Temp:    98.3 F (36.8 C)  TempSrc:    Oral  SpO2: 97%  98%   Weight:      Height:        Intake/Output Summary (Last 24 hours) at 01/02/2024 1107 Last data filed at 01/02/2024 0836 Gross per 24 hour  Intake 505.83 ml  Output --  Net 505.83 ml   Filed Weights   01/01/24 2206  Weight: 99.8 kg    Examination:  General exam: Appears calm and comfortable  Respiratory system: Clear to auscultation. Respiratory effort normal. Cardiovascular system: S1 & S2 heard, RRR. Soft systolic murmur Gastrointestinal system: Abdomen is nondistended, soft and nontender.   Central nervous system: Alert and oriented. No focal neurological deficits. Extremities: Symmetric 5 x 5 power. Skin: No rashes, lesions or ulcers Psychiatry: Judgement and insight appear normal. Mood & affect appropriate.     Data Reviewed: I have personally reviewed following labs and imaging studies  CBC: Recent Labs  Lab 01/01/24 2213  01/02/24 0426  WBC 6.5 6.1  HGB 12.1* 12.0*  HCT 33.3* 34.0*  MCV 104.7* 104.9*  PLT 187 188   Basic Metabolic Panel: Recent Labs  Lab 01/01/24 2213 01/02/24 0426  NA 128* 133*  K 3.5 3.7  CL 92* 96*  CO2 24 24  GLUCOSE 163* 141*  BUN 15 13  CREATININE 0.73 0.76  CALCIUM  9.1 9.4   GFR: Estimated Creatinine Clearance: 88.2 mL/min (by C-G formula based on SCr of 0.76 mg/dL). Liver Function Tests: No results for input(s): AST, ALT, ALKPHOS, BILITOT, PROT, ALBUMIN in the last 168 hours. No  results for input(s): LIPASE, AMYLASE in the last 168 hours. No results for input(s): AMMONIA in the last 168 hours. Coagulation Profile: Recent Labs  Lab 01/02/24 0004 01/02/24 0426  INR 1.1 1.1   Cardiac Enzymes: No results for input(s): CKTOTAL, CKMB, CKMBINDEX, TROPONINI in the last 168 hours. BNP (last 3 results) No results for input(s): PROBNP in the last 8760 hours. HbA1C: No results for input(s): HGBA1C in the last 72 hours. CBG: Recent Labs  Lab 01/02/24 0722  GLUCAP 142*   Lipid Profile: Recent Labs    01/02/24 0426  CHOL 119  HDL 60  LDLCALC 33  TRIG 132  CHOLHDL 2.0   Thyroid  Function Tests: No results for input(s): TSH, T4TOTAL, FREET4, T3FREE, THYROIDAB in the last 72 hours. Anemia Panel: No results for input(s): VITAMINB12, FOLATE, FERRITIN, TIBC, IRON, RETICCTPCT in the last 72 hours. Urine analysis:    Component Value Date/Time   COLORURINE YELLOW (A) 04/22/2017 1639   APPEARANCEUR HAZY (A) 04/22/2017 1639   APPEARANCEUR Clear 03/10/2012 1014   LABSPEC 1.018 04/22/2017 1639   LABSPEC 1.011 03/10/2012 1014   PHURINE 5.0 04/22/2017 1639   GLUCOSEU NEGATIVE 04/22/2017 1639   GLUCOSEU Negative 03/10/2012 1014   HGBUR SMALL (A) 04/22/2017 1639   BILIRUBINUR NEGATIVE 04/22/2017 1639   BILIRUBINUR Negative 03/10/2012 1014   KETONESUR 5 (A) 04/22/2017 1639   PROTEINUR 100 (A) 04/22/2017 1639   NITRITE NEGATIVE 04/22/2017 1639   LEUKOCYTESUR NEGATIVE 04/22/2017 1639   LEUKOCYTESUR Negative 03/10/2012 1014   Sepsis Labs: @LABRCNTIP (procalcitonin:4,lacticidven:4)  )No results found for this or any previous visit (from the past 240 hours).       Radiology Studies: DG Chest 2 View Result Date: 01/01/2024 CLINICAL DATA:  Chest pain. EXAM: CHEST - 2 VIEW COMPARISON:  None Available. FINDINGS: The heart size and mediastinal contours are within normal limits. Both lungs are clear. The visualized skeletal  structures are unremarkable. IMPRESSION: No active cardiopulmonary disease. Electronically Signed   By: Suzen Dials M.D.   On: 01/01/2024 22:42        Scheduled Meds:  amLODipine   5 mg Oral Daily   aspirin  EC  81 mg Oral Daily   [START ON 01/03/2024] atorvastatin   80 mg Oral Daily   cholecalciferol   1,000 Units Oral Daily   cyanocobalamin   1,000 mcg Oral Daily   fluticasone   2 spray Each Nare Daily   furosemide   20 mg Oral Daily   insulin  aspart  0-5 Units Subcutaneous QHS   insulin  aspart  0-9 Units Subcutaneous TID WC   isosorbide  mononitrate  30 mg Oral Daily   losartan   50 mg Oral Daily   melatonin  10 mg Oral QHS   metoprolol  tartrate  50 mg Oral BID   pantoprazole   40 mg Oral BID   ranolazine   500 mg Oral BID   Continuous Infusions:  sodium chloride  100 mL/hr at 01/02/24  9176   heparin  1,200 Units/hr (01/02/24 0836)     LOS: 1 day     Devaughn KATHEE Ban, MD [facility]   If 7PM-7AM, please contact night-coverage [email or web address] Password [email or web address] 01/02/2024, 11:07 AM

## 2024-01-02 NOTE — Telephone Encounter (Signed)
 Patient Product/process development scientist completed.    The patient is insured through [employer]. Patient has Medicare and is not eligible for a copay card, but may be able to apply for patient assistance or Medicare RX Payment Plan (Patient Must reach out to their plan, if eligible for payment plan), if available.    Ran test claim for Eliquis 5 mg and the current 30 day co-pay is $349.10 due to a $590.00 deductible.  Ran test claim for Xarelto 20 mg and the current 30 day co-pay is $347.48 due to a $590.00 deductible.  This test claim was processed through [facility]- copay amounts may vary at other pharmacies due to pharmacy/plan contracts, or as the patient moves through the different stages of their insurance plan.     Reyes Sharps, CPHT Pharmacy Technician III Certified Patient Advocate [facility] Patient Advocate Team Direct Number: [phone]  Fax: [phone]

## 2024-01-02 NOTE — Interval H&P Note (Signed)
 History and Physical Interval Note:  01/02/2024 3:50 PM  Daniel Holden  has presented today for surgery, with the diagnosis of non-STEMI The various methods of treatment have been discussed with the patient and family. After consideration of risks, benefits and other options for treatment, the patient has consented to  Procedure(s): LEFT HEART CATH AND CORONARY ANGIOGRAPHY (N/A)  PERCUTANEOUS CORONARY INTERVENTION  as a surgical intervention.  The patient's history has been reviewed, patient examined, no change in status, stable for surgery.  I have reviewed the patient's chart and labs.  Questions were answered to the patient's satisfaction.     Cath Lab Visit (complete for each Cath Lab visit)  Clinical Evaluation Leading to the Procedure:   ACS: Yes.    Non-ACS:    Anginal Classification: CCS IV  Anti-ischemic medical therapy: Minimal Therapy (1 class of medications)  Non-Invasive Test Results: No non-invasive testing performed  Prior CABG: No previous CABG    Alm Clay

## 2024-01-02 NOTE — Assessment & Plan Note (Signed)
Will continue antihypertensive therapy.

## 2024-01-02 NOTE — ED Provider Notes (Signed)
 [facility] Provider Note    Event Date/Time   First MD Initiated Contact with Patient 01/01/24 2256     (approximate)   History   Chest Pain   HPI  Daniel Holden is a 78 y.o. male   Past medical history of chronic CAD who has had worsening exertional chest pain over the last 2 days and then a severe episode while moving his lawnmower away from the river tonight.  His typical chest pain usually goes away with rest but this episode lasted about 2 hours even while resting.   EMS gave aspirin , nitro, pain has subsided now.   He is in new onset atrial fibrillation with a rate in the 90s to 100s and normotensive.    Independent Historian contributed to assessment above: His wife corroborates information above  External Medical Documents Reviewed: Previous cardiology note      Physical Exam   Triage Vital Signs: ED Triage Vitals  Encounter Vitals Group     BP 01/01/24 2204 (!) 156/99     Girls Systolic BP Percentile --      Girls Diastolic BP Percentile --      Boys Systolic BP Percentile --      Boys Diastolic BP Percentile --      Pulse Rate 01/01/24 2204 98     Resp 01/01/24 2204 18     Temp 01/01/24 2204 98.4 F (36.9 C)     Temp Source 01/01/24 2204 Oral     SpO2 01/01/24 2202 100 %     Weight 01/01/24 2206 220 lb (99.8 kg)     Height 01/01/24 2206 5' 6 (1.676 m)     Head Circumference --      Peak Flow --      Pain Score 01/01/24 2205 1     Pain Loc --      Pain Education --      Exclude from Growth Chart --     Most recent vital signs: Vitals:   01/02/24 0400 01/02/24 0500  BP: 116/71 130/70  Pulse: (!) 57 (!) 58  Resp: 14 20  Temp:    SpO2: 97% 97%    General: Awake, no distress.  CV:  Good peripheral perfusion.  Resp:  Normal effort.  Abd:  No distention.  Other:  Awake comfortable in atrial fibrillation irregular rate in the 90s to 100.  Normotensive.  Lung sounds clear without rales or focality  ED Results /  Procedures / Treatments   Labs (all labs ordered are listed, but only abnormal results are displayed) Labs Reviewed  BASIC METABOLIC PANEL WITH GFR - Abnormal; Notable for the following components:      Result Value   Sodium 128 (*)    Chloride 92 (*)    Glucose, Bld 163 (*)    All other components within normal limits  CBC - Abnormal; Notable for the following components:   RBC 3.18 (*)    Hemoglobin 12.1 (*)    HCT 33.3 (*)    MCV 104.7 (*)    MCH 38.1 (*)    MCHC 36.3 (*)    All other components within normal limits  BASIC METABOLIC PANEL WITH GFR - Abnormal; Notable for the following components:   Sodium 133 (*)    Chloride 96 (*)    Glucose, Bld 141 (*)    All other components within normal limits  CBC - Abnormal; Notable for the following components:   RBC 3.24 (*)  Hemoglobin 12.0 (*)    HCT 34.0 (*)    MCV 104.9 (*)    MCH 37.0 (*)    All other components within normal limits  TROPONIN I (HIGH SENSITIVITY) - Abnormal; Notable for the following components:   Troponin I (High Sensitivity) 125 (*)    All other components within normal limits  TROPONIN I (HIGH SENSITIVITY) - Abnormal; Notable for the following components:   Troponin I (High Sensitivity) 1,199 (*)    All other components within normal limits  APTT  PROTIME-INR  LIPID PANEL  PROTIME-INR  LIPOPROTEIN A (LPA)     I ordered and reviewed the above labs they are notable for initial troponin is 125  EKG  ED ECG REPORT I, Ginnie Shams, the attending physician, personally viewed and interpreted this ECG.   Date: 01/02/2024  EKG Time: 2205  Rate: 121  Rhythm: AF RVR  Axis: nl  Intervals:nl  ST&T Change: no stemi    RADIOLOGY I independently reviewed and interpreted chest x-ray I see no obvious focality pneumothorax I also reviewed radiologist's formal read.   PROCEDURES:  Critical Care performed: Yes, see critical care procedure note(s)  .Critical Care  Performed by: Shams Ginnie,  MD Authorized by: Shams Ginnie, MD   Critical care provider statement:    Critical care time (minutes):  40   Critical care was time spent personally by me on the following activities:  Development of treatment plan with patient or surrogate, discussions with consultants, evaluation of patient's response to treatment, examination of patient, ordering and review of laboratory studies, ordering and review of radiographic studies, ordering and performing treatments and interventions, pulse oximetry, re-evaluation of patient's condition and review of old charts    MEDICATIONS ORDERED IN ED: Medications  heparin  ADULT infusion 100 units/mL (25000 units/250mL) (1,000 Units/hr Intravenous New Bag/Given 01/02/24 0013)  pantoprazole  (PROTONIX ) EC tablet 40 mg (has no administration in time range)  aspirin  EC tablet 81 mg (has no administration in time range)  amLODipine  (NORVASC ) tablet 5 mg (has no administration in time range)  atorvastatin  (LIPITOR) tablet 80 mg (has no administration in time range)  furosemide  (LASIX ) tablet 20 mg (has no administration in time range)  isosorbide  mononitrate (IMDUR ) 24 hr tablet 30 mg (has no administration in time range)  losartan  (COZAAR ) tablet 50 mg (has no administration in time range)  metoprolol  tartrate (LOPRESSOR ) tablet 50 mg (has no administration in time range)  ranolazine  (RANEXA ) 12 hr tablet 500 mg (has no administration in time range)  ALPRAZolam  (XANAX ) tablet 0.25 mg (has no administration in time range)  cyanocobalamin  (VITAMIN B12) tablet 1,000 mcg (has no administration in time range)  melatonin tablet 10 mg (has no administration in time range)  cholecalciferol  (VITAMIN D3) tablet 1,000 Units (has no administration in time range)  fluticasone  (FLONASE ) 50 MCG/ACT nasal spray 2 spray (has no administration in time range)  nitroGLYCERIN  (NITROSTAT ) SL tablet 0.4 mg (has no administration in time range)  acetaminophen  (TYLENOL ) tablet 650 mg  (has no administration in time range)  ondansetron  (ZOFRAN ) injection 4 mg (has no administration in time range)  0.9 %  sodium chloride  infusion ( Intravenous New Bag/Given 01/02/24 0425)  magnesium  hydroxide (MILK OF MAGNESIA) suspension 30 mL (has no administration in time range)  traZODone  (DESYREL ) tablet 25 mg (has no administration in time range)  heparin  bolus via infusion 4,000 Units (4,000 Units Intravenous Bolus from Bag 01/02/24 0013)  atorvastatin  (LIPITOR) tablet 80 mg (80 mg Oral Given 01/02/24 0211)  metoprolol  tartrate (LOPRESSOR ) tablet 50 mg (50 mg Oral Given 01/02/24 0210)  aspirin  chewable tablet 324 mg (324 mg Oral Given 01/02/24 0423)    Or  aspirin  suppository 300 mg ( Rectal See Alternative 01/02/24 0423)    External physician / consultants:  I spoke with hospitalist medicine for admission and regarding care plan for this patient.   IMPRESSION / MDM / ASSESSMENT AND PLAN / ED COURSE  I reviewed the triage vital signs and the nursing notes.                                Patient's presentation is most consistent with acute presentation with potential threat to life or bodily function.  Differential diagnosis includes, but is not limited to, unstable angina, NSTEMI, considered but less likely dissection or PE   The patient is on the cardiac monitor to evaluate for evidence of arrhythmia and/or significant heart rate changes.  MDM:    Patient here with unstable angina worsening chest pain with exertion and then on onset of chest pain with exertion today lasted longer than he typically would expect after resting.  Initial troponin 125 and new onset atrial fibrillation but rate in the 90s to 100 and normotensive.  Will start on heparin .  Already got aspirin .  Admission.       FINAL CLINICAL IMPRESSION(S) / ED DIAGNOSES   Final diagnoses:  NSTEMI (non-ST elevated myocardial infarction) [facility])  Atrial fibrillation, new onset (HCC)     Rx / DC Orders   ED  Discharge Orders     None        Note:  This document was prepared using Dragon voice recognition software and may include unintentional dictation errors.    Cyrena Mylar, MD 01/02/24 [phone]

## 2024-01-02 NOTE — Progress Notes (Signed)
 ANTICOAGULATION CONSULT NOTE  Pharmacy Consult for heparin  infusion Indication: ACS/STEMI  Allergies  Allergen Reactions   Metronidazole  Other (See Comments)    Patient stated that he had a moderate level of pain with his allergic reaction.    Ace Inhibitors Cough   Dilaudid [Hydromorphone Hcl] Nausea And Vomiting    Patient Measurements: Height: 5' 6 (167.6 cm) Weight: 97.1 kg (214 lb) IBW/kg (Calculated) : 63.8 HEPARIN  DW (KG): 84.9  Vital Signs: Temp: 97.4 F (36.3 C) (07/10 1702) Temp Source: Temporal (07/10 1702) BP: 148/62 (07/10 1845) Pulse Rate: 76 (07/10 1845)  Labs: Recent Labs    01/01/24 2213 01/02/24 0004 01/02/24 0426 01/02/24 0734 01/02/24 0825  HGB 12.1*  --  12.0*  --   --   HCT 33.3*  --  34.0*  --   --   PLT 187  --  188  --   --   APTT  --  28  --   --   --   LABPROT  --  14.4 14.6  --   --   INR  --  1.1 1.1  --   --   HEPARINUNFRC  --   --   --  0.28*  --   CREATININE 0.73  --  0.76  --   --   TROPONINIHS 125* 1,199*  --   --  5,240*    Estimated Creatinine Clearance: 87 mL/min (by C-G formula based on SCr of 0.76 mg/dL).   Medical History: Past Medical History:  Diagnosis Date   Allergy    Angina of effort (HCC)    Anxiety    CAD (coronary artery disease), native coronary artery    coronary CTA showing aortic atherosclerosis with very high coronary Ca score at 4630 with severely calcified RCA and LAD > 70% and 50-69% prox to mid LCx.     Cancer (HCC)    Carpal tunnel syndrome    GERD (gastroesophageal reflux disease)    HLD (hyperlipidemia)    Hypertension    Lung nodule    Obesity    Pancreatitis 2015   Sleep apnea     Assessment: Pt is a 78 yo male presenting to ED c/o chest pain found with elevated Troponin I level.  Goal of Therapy:  Heparin  level 0.3-0.7 units/ml Monitor platelets by anticoagulation protocol: Yes   7/10@0734 : HL 0.28, subtherapeutic@1000  units/hr  Plan:  Heparin  drip to resume 2 hours after  removal of radial band Resume heparin  at 1200 units/hr 7/10 @ 2100 Check HL 8 hours after heparin  resumed CBC daily while on heparin   Kayla JULIANNA Blew, PharmD Clinical Pharmacist 01/02/2024 6:54 PM

## 2024-01-02 NOTE — Assessment & Plan Note (Signed)
-   This has been fairly controlled especially with beta-blocker therapy. - Cardiology consult to be obtained as well as 2D echo.

## 2024-01-03 ENCOUNTER — Inpatient Hospital Stay (HOSPITAL_COMMUNITY)
Admit: 2024-01-03 | Discharge: 2024-01-03 | Disposition: A | Discharge status: Home / Self Care | Attending: Family Medicine

## 2024-01-03 ENCOUNTER — Other Ambulatory Visit (HOSPITAL_COMMUNITY)

## 2024-01-03 ENCOUNTER — Inpatient Hospital Stay (HOSPITAL_COMMUNITY)
Admission: AD | Admit: 2024-01-03 | Discharge: 2024-01-10 | DRG: 234 | Disposition: A | Discharge status: Home / Self Care | Source: Other Acute Inpatient Hospital | Attending: Thoracic Surgery (Cardiothoracic Vascular Surgery) | Admitting: Thoracic Surgery (Cardiothoracic Vascular Surgery)

## 2024-01-03 ENCOUNTER — Encounter: Payer: Self-pay | Admitting: Cardiology

## 2024-01-03 DIAGNOSIS — Z7982 Long term (current) use of aspirin: Secondary | ICD-10-CM | POA: Diagnosis not present

## 2024-01-03 DIAGNOSIS — Z951 Presence of aortocoronary bypass graft: Secondary | ICD-10-CM | POA: Diagnosis not present

## 2024-01-03 DIAGNOSIS — I214 Non-ST elevation (NSTEMI) myocardial infarction: Secondary | ICD-10-CM | POA: Diagnosis not present

## 2024-01-03 DIAGNOSIS — R062 Wheezing: Secondary | ICD-10-CM | POA: Diagnosis present

## 2024-01-03 DIAGNOSIS — N189 Chronic kidney disease, unspecified: Secondary | ICD-10-CM | POA: Diagnosis not present

## 2024-01-03 DIAGNOSIS — R112 Nausea with vomiting, unspecified: Secondary | ICD-10-CM | POA: Diagnosis present

## 2024-01-03 DIAGNOSIS — I25118 Atherosclerotic heart disease of native coronary artery with other forms of angina pectoris: Secondary | ICD-10-CM | POA: Diagnosis not present

## 2024-01-03 DIAGNOSIS — I252 Old myocardial infarction: Secondary | ICD-10-CM

## 2024-01-03 DIAGNOSIS — R001 Bradycardia, unspecified: Secondary | ICD-10-CM | POA: Diagnosis present

## 2024-01-03 DIAGNOSIS — I2582 Chronic total occlusion of coronary artery: Secondary | ICD-10-CM | POA: Diagnosis not present

## 2024-01-03 DIAGNOSIS — E66811 Obesity, class 1: Secondary | ICD-10-CM | POA: Diagnosis not present

## 2024-01-03 DIAGNOSIS — Z823 Family history of stroke: Secondary | ICD-10-CM | POA: Diagnosis not present

## 2024-01-03 DIAGNOSIS — D62 Acute posthemorrhagic anemia: Secondary | ICD-10-CM | POA: Diagnosis not present

## 2024-01-03 DIAGNOSIS — Z48812 Encounter for surgical aftercare following surgery on the circulatory system: Secondary | ICD-10-CM | POA: Diagnosis not present

## 2024-01-03 DIAGNOSIS — Z6834 Body mass index (BMI) 34.0-34.9, adult: Secondary | ICD-10-CM | POA: Diagnosis not present

## 2024-01-03 DIAGNOSIS — D696 Thrombocytopenia, unspecified: Secondary | ICD-10-CM | POA: Diagnosis present

## 2024-01-03 DIAGNOSIS — J9811 Atelectasis: Secondary | ICD-10-CM | POA: Diagnosis not present

## 2024-01-03 DIAGNOSIS — I2510 Atherosclerotic heart disease of native coronary artery without angina pectoris: Secondary | ICD-10-CM | POA: Diagnosis present

## 2024-01-03 DIAGNOSIS — I4891 Unspecified atrial fibrillation: Secondary | ICD-10-CM | POA: Diagnosis not present

## 2024-01-03 DIAGNOSIS — Z1152 Encounter for screening for COVID-19: Secondary | ICD-10-CM | POA: Diagnosis not present

## 2024-01-03 DIAGNOSIS — G4733 Obstructive sleep apnea (adult) (pediatric): Secondary | ICD-10-CM | POA: Diagnosis present

## 2024-01-03 DIAGNOSIS — Z79899 Other long term (current) drug therapy: Secondary | ICD-10-CM

## 2024-01-03 DIAGNOSIS — E871 Hypo-osmolality and hyponatremia: Secondary | ICD-10-CM | POA: Diagnosis not present

## 2024-01-03 DIAGNOSIS — E785 Hyperlipidemia, unspecified: Secondary | ICD-10-CM | POA: Diagnosis not present

## 2024-01-03 DIAGNOSIS — K219 Gastro-esophageal reflux disease without esophagitis: Secondary | ICD-10-CM | POA: Diagnosis present

## 2024-01-03 DIAGNOSIS — I088 Other rheumatic multiple valve diseases: Secondary | ICD-10-CM | POA: Diagnosis not present

## 2024-01-03 DIAGNOSIS — I10 Essential (primary) hypertension: Secondary | ICD-10-CM | POA: Diagnosis not present

## 2024-01-03 DIAGNOSIS — J90 Pleural effusion, not elsewhere classified: Secondary | ICD-10-CM | POA: Diagnosis not present

## 2024-01-03 DIAGNOSIS — I200 Unstable angina: Secondary | ICD-10-CM | POA: Diagnosis not present

## 2024-01-03 DIAGNOSIS — I480 Paroxysmal atrial fibrillation: Secondary | ICD-10-CM | POA: Diagnosis not present

## 2024-01-03 DIAGNOSIS — K5900 Constipation, unspecified: Secondary | ICD-10-CM | POA: Diagnosis present

## 2024-01-03 DIAGNOSIS — I129 Hypertensive chronic kidney disease with stage 1 through stage 4 chronic kidney disease, or unspecified chronic kidney disease: Secondary | ICD-10-CM | POA: Diagnosis not present

## 2024-01-03 DIAGNOSIS — G4730 Sleep apnea, unspecified: Secondary | ICD-10-CM | POA: Diagnosis not present

## 2024-01-03 DIAGNOSIS — Z6835 Body mass index (BMI) 35.0-35.9, adult: Secondary | ICD-10-CM | POA: Diagnosis not present

## 2024-01-03 DIAGNOSIS — Z9889 Other specified postprocedural states: Secondary | ICD-10-CM | POA: Diagnosis not present

## 2024-01-03 DIAGNOSIS — R0989 Other specified symptoms and signs involving the circulatory and respiratory systems: Secondary | ICD-10-CM | POA: Diagnosis not present

## 2024-01-03 DIAGNOSIS — Z888 Allergy status to other drugs, medicaments and biological substances status: Secondary | ICD-10-CM | POA: Diagnosis not present

## 2024-01-03 DIAGNOSIS — Z01810 Encounter for preprocedural cardiovascular examination: Secondary | ICD-10-CM | POA: Diagnosis not present

## 2024-01-03 LAB — BASIC METABOLIC PANEL WITH GFR
Anion gap: 9 (ref 5–15)
BUN: 10 mg/dL (ref 8–23)
CO2: 25 mmol/L (ref 22–32)
Calcium: 8.9 mg/dL (ref 8.9–10.3)
Chloride: 102 mmol/L (ref 98–111)
Creatinine, Ser: 0.48 mg/dL — ABNORMAL LOW (ref 0.61–1.24)
GFR, Estimated: >60 mL/min (ref >60)
Glucose, Bld: 132 mg/dL — ABNORMAL HIGH (ref 70–99)
Potassium: 3.6 mmol/L (ref 3.5–5.1)
Sodium: 136 mmol/L (ref 135–145)

## 2024-01-03 LAB — GLUCOSE, CAPILLARY
Glucose-Capillary: 144 mg/dL — ABNORMAL HIGH (ref 70–99)
Glucose-Capillary: 170 mg/dL — ABNORMAL HIGH (ref 70–99)

## 2024-01-03 LAB — CBC
HCT: 32.7 % — ABNORMAL LOW (ref 39.0–52.0)
Hemoglobin: 11.5 g/dL — ABNORMAL LOW (ref 13.0–17.0)
MCH: 37.2 pg — ABNORMAL HIGH (ref 26.0–34.0)
MCHC: 35.2 g/dL (ref 30.0–36.0)
MCV: 105.8 fL — ABNORMAL HIGH (ref 80.0–100.0)
Platelets: 178 K/uL (ref 150–400)
RBC: 3.09 MIL/uL — ABNORMAL LOW (ref 4.22–5.81)
RDW: 12.1 % (ref 11.5–15.5)
WBC: 5.3 K/uL (ref 4.0–10.5)
nRBC: 0 % (ref 0.0–0.2)

## 2024-01-03 LAB — LIPOPROTEIN A (LPA): Lipoprotein (a): <8.4 nmol/L (ref <75.0)

## 2024-01-03 LAB — ECHOCARDIOGRAM COMPLETE
Area-P 1/2: 3.01 cm2
Height: 66 in
Weight: 3424 [oz_av]

## 2024-01-03 LAB — HEPARIN LEVEL (UNFRACTIONATED)
Heparin Unfractionated: 0.4 [IU]/mL (ref 0.30–0.70)
Heparin Unfractionated: 0.35 [IU]/mL (ref 0.30–0.70)

## 2024-01-03 MED ORDER — METOPROLOL TARTRATE 50 MG PO TABS
50.0000 mg | ORAL_TABLET | Freq: Two times a day (BID) | ORAL | Status: DC
Start: 2024-01-03 — End: 2024-01-06
  Administered 2024-01-03 – 2024-01-05 (×5): 50 mg via ORAL
  Filled 2024-01-03 (×5): qty 1

## 2024-01-03 MED ORDER — HEPARIN (PORCINE) 25000 UT/250ML-% IV SOLN: 1200.0000 [IU]/h | INTRAVENOUS | Status: DC

## 2024-01-03 MED ORDER — SODIUM CHLORIDE 0.9 % IV SOLN: 250.0000 mL | INTRAVENOUS | Status: AC | PRN

## 2024-01-03 MED ORDER — SODIUM CHLORIDE 0.9% FLUSH: 3.0000 mL | INTRAVENOUS | Status: DC | PRN

## 2024-01-03 MED ORDER — FUROSEMIDE 20 MG PO TABS
20.0000 mg | ORAL_TABLET | Freq: Every day | ORAL | Status: DC
  Administered 2024-01-04 – 2024-01-05 (×2): 20 mg via ORAL
  Filled 2024-01-03 (×2): qty 1

## 2024-01-03 MED ORDER — ALPRAZOLAM 0.5 MG PO TABS
0.5000 mg | ORAL_TABLET | Freq: Every evening | ORAL | Status: DC | PRN
  Administered 2024-01-03 – 2024-01-05 (×3): 0.5 mg via ORAL
  Filled 2024-01-03 (×4): qty 1

## 2024-01-03 MED ORDER — RANOLAZINE ER 500 MG PO TB12
500.0000 mg | ORAL_TABLET | Freq: Two times a day (BID) | ORAL | Status: DC
Start: 2024-01-03 — End: 2024-01-06
  Administered 2024-01-03 – 2024-01-05 (×5): 500 mg via ORAL
  Filled 2024-01-03 (×5): qty 1

## 2024-01-03 MED ORDER — ATORVASTATIN CALCIUM 80 MG PO TABS: 80.0000 mg | ORAL_TABLET | Freq: Every day | ORAL | Status: DC

## 2024-01-03 MED ORDER — ISOSORBIDE MONONITRATE ER 30 MG PO TB24
30.0000 mg | ORAL_TABLET | Freq: Every day | ORAL | Status: DC
  Administered 2024-01-04 – 2024-01-05 (×2): 30 mg via ORAL
  Filled 2024-01-03 (×2): qty 1

## 2024-01-03 MED ORDER — LOSARTAN POTASSIUM 50 MG PO TABS
50.0000 mg | ORAL_TABLET | Freq: Every day | ORAL | Status: DC
Start: 2024-01-04 — End: 2024-01-05
  Administered 2024-01-04 – 2024-01-05 (×2): 50 mg via ORAL
  Filled 2024-01-03 (×2): qty 1

## 2024-01-03 MED ORDER — ASPIRIN 81 MG PO TBEC
81.0000 mg | DELAYED_RELEASE_TABLET | Freq: Every day | ORAL | Status: DC
Start: 2024-01-03 — End: 2024-01-03

## 2024-01-03 MED ORDER — ASPIRIN 81 MG PO TBEC
81.0000 mg | DELAYED_RELEASE_TABLET | Freq: Every day | ORAL | Status: DC
  Administered 2024-01-04 – 2024-01-05 (×2): 81 mg via ORAL
  Filled 2024-01-03 (×2): qty 1

## 2024-01-03 MED ORDER — ATORVASTATIN CALCIUM 80 MG PO TABS
80.0000 mg | ORAL_TABLET | Freq: Every day | ORAL | Status: DC
  Administered 2024-01-03 – 2024-01-09 (×7): 80 mg via ORAL
  Filled 2024-01-03 (×7): qty 1

## 2024-01-03 MED ORDER — SODIUM CHLORIDE 0.9% FLUSH
3.0000 mL | Freq: Two times a day (BID) | INTRAVENOUS | Status: DC
  Administered 2024-01-03: 3 mL via INTRAVENOUS

## 2024-01-03 MED ORDER — HEPARIN (PORCINE) 25000 UT/250ML-% IV SOLN
1200.0000 [IU]/h | INTRAVENOUS | Status: DC
  Administered 2024-01-04 – 2024-01-05 (×2): 1200 [IU]/h via INTRAVENOUS
  Filled 2024-01-03 (×2): qty 250

## 2024-01-03 NOTE — Progress Notes (Signed)
 PHARMACY - ANTICOAGULATION CONSULT NOTE  Pharmacy Consult for heparin  dosage adjustment  Indication: chest pain/ACS and new onset atrial fibrillation   Allergies  Allergen Reactions   Metronidazole  Other (See Comments)    Patient stated that he had a moderate level of pain with his allergic reaction.    Ace Inhibitors Cough   Dilaudid  [Hydromorphone  Hcl] Nausea And Vomiting    Patient Measurements: Height: 5' 6 (167.6 cm) Weight: 97.1 kg (214 lb) IBW/kg (Calculated) : 63.8 HEPARIN  DW (KG): 84.9  Vital Signs: Temp: 98.2 F (36.8 C) (07/11 1126) BP: 125/70 (07/11 1126) Pulse Rate: 70 (07/11 1126)  Labs: Recent Labs    01/01/24 2213 01/02/24 0004 01/02/24 0426 01/02/24 0734 01/02/24 0825 01/03/24 0458 01/03/24 1301  HGB 12.1*  --  12.0*  --   --  11.5*  --   HCT 33.3*  --  34.0*  --   --  32.7*  --   PLT 187  --  188  --   --  178  --   APTT  --  28  --   --   --   --   --   LABPROT  --  14.4 14.6  --   --   --   --   INR  --  1.1 1.1  --   --   --   --   HEPARINUNFRC  --   --   --  0.28*  --  0.40 0.35  CREATININE 0.73  --  0.76  --   --  0.48*  --   TROPONINIHS 125* 1,199*  --   --  5,240*  --   --     Estimated Creatinine Clearance: 87 mL/min (A) (by C-G formula based on SCr of 0.48 mg/dL (L)).   Medical History: Past Medical History:  Diagnosis Date   Allergy    Angina of effort (HCC)    Anxiety    CAD (coronary artery disease), native coronary artery    coronary CTA showing aortic atherosclerosis with very high coronary Ca score at 4630 with severely calcified RCA and LAD > 70% and 50-69% prox to mid LCx.     Cancer (HCC)    Carpal tunnel syndrome    GERD (gastroesophageal reflux disease)    HLD (hyperlipidemia)    Hypertension    Lung nodule    Obesity    Pancreatitis 2015   Sleep apnea     Medications:  Scheduled:   aspirin  EC  81 mg Oral Daily   atorvastatin   80 mg Oral Daily   cholecalciferol   1,000 Units Oral Daily   cyanocobalamin    1,000 mcg Oral Daily   fluticasone   2 spray Each Nare Daily   furosemide   20 mg Oral Daily   insulin  aspart  0-5 Units Subcutaneous QHS   insulin  aspart  0-9 Units Subcutaneous TID WC   isosorbide  mononitrate  30 mg Oral Daily   losartan   50 mg Oral Daily   melatonin  10 mg Oral QHS   metoprolol  tartrate  50 mg Oral BID   pantoprazole   40 mg Oral BID   ranolazine   500 mg Oral BID   sodium chloride  flush  3 mL Intravenous Q12H   Infusions:   sodium chloride      heparin  1,200 Units/hr (01/03/24 0511)   PRN: sodium chloride , acetaminophen , ALPRAZolam , magnesium  hydroxide, nitroGLYCERIN , ondansetron  (ZOFRAN ) IV, sodium chloride  flush, traZODone   Assessment: 78 year old male presenting with NSTEMI and new onset AFib  with plan to transfer to [facility]G. Notable PMH OSA, HTN, T2DM, CAD, hepatic steatosis. CHAD-VASC score of 4. Last CBC stable with Hgb 11.5 g/dL and platelet count of 821 K/uL. Renal function good with Scr 0.48 mg/dL (CrCl 78 mL/min). Troponin elevated at 1,199 ng/L and 5,240 ng/L.   Heparin  level of 0.35 within goal range of 0.3 - 0.7, and is second level within goal range. Has been receiving 1,200 unit/hr heparin  infusion.  Goal of Therapy:  Heparin  level 0.3-0.7 units/ml Monitor platelets by anticoagulation protocol: Yes   Plan:  Continue heparin  infusion at 1,200 units/hr. Monitor heparin  level and CBC daily for hemoglobin and platelet count.   Rylyn Zawistowski [place] - PharmD Candidate  01/03/2024,1:35 PM

## 2024-01-03 NOTE — Progress Notes (Signed)
 Pt alert and oriented. VSS. Report called to [place] [facility] on [address] at [facility]. Pt left unit on stretcher with transportation staff. Heparin  drip infusing at 12mL/hr.

## 2024-01-03 NOTE — Progress Notes (Signed)
 ANTICOAGULATION CONSULT NOTE  Pharmacy Consult for heparin  infusion Indication: ACS/STEMI  Allergies  Allergen Reactions   Metronidazole  Other (See Comments)    Patient stated that he had a moderate level of pain with his allergic reaction.    Ace Inhibitors Cough   Dilaudid  [Hydromorphone  Hcl] Nausea And Vomiting    Patient Measurements: Height: 5' 6 (167.6 cm) Weight: 97.1 kg (214 lb) IBW/kg (Calculated) : 63.8 HEPARIN  DW (KG): 84.9  Vital Signs: Temp: 97.5 F (36.4 C) (07/11 0437) Temp Source: Temporal (07/10 1900) BP: 127/71 (07/11 0437) Pulse Rate: 57 (07/11 0437)  Labs: Recent Labs    01/01/24 2213 01/02/24 0004 01/02/24 0426 01/02/24 0734 01/02/24 0825 01/03/24 0458  HGB 12.1*  --  12.0*  --   --  11.5*  HCT 33.3*  --  34.0*  --   --  32.7*  PLT 187  --  188  --   --  178  APTT  --  28  --   --   --   --   LABPROT  --  14.4 14.6  --   --   --   INR  --  1.1 1.1  --   --   --   HEPARINUNFRC  --   --   --  0.28*  --  0.40  CREATININE 0.73  --  0.76  --   --  0.48*  TROPONINIHS 125* 1,199*  --   --  5,240*  --     Estimated Creatinine Clearance: 87 mL/min (A) (by C-G formula based on SCr of 0.48 mg/dL (L)).   Medical History: Past Medical History:  Diagnosis Date   Allergy    Angina of effort (HCC)    Anxiety    CAD (coronary artery disease), native coronary artery    coronary CTA showing aortic atherosclerosis with very high coronary Ca score at 4630 with severely calcified RCA and LAD > 70% and 50-69% prox to mid LCx.     Cancer (HCC)    Carpal tunnel syndrome    GERD (gastroesophageal reflux disease)    HLD (hyperlipidemia)    Hypertension    Lung nodule    Obesity    Pancreatitis 2015   Sleep apnea     Assessment: Pt is a 78 yo male presenting to ED c/o chest pain found with elevated Troponin I level.  Goal of Therapy:  Heparin  level 0.3-0.7 units/ml Monitor platelets by anticoagulation protocol: Yes   7/10@0734 : HL 0.28,  subtherapeutic@1000  units/hr 7/11 0458 HL 0.40, therapeutic x 1  Plan:  Continue heparin  at 1200 units/hr Recheck HL 8 hours to confirm CBC daily while on heparin   Rankin CANDIE Dills, PharmD, [facility] 01/03/2024 6:34 AM

## 2024-01-03 NOTE — Progress Notes (Signed)
 Rounding Note   Patient Name: Daniel Holden Date of Encounter: 01/03/2024  [facility] Cardiologist: Wilbert Bihari, MD   Subjective Patient reports feeling well without chest pain and shortness of breath. Cath site is stable without sign of bleeding and hematoma. He is pending transfer to [name] for CABG evaluation.   Scheduled Meds:  aspirin  EC  81 mg Oral Daily   atorvastatin   80 mg Oral Daily   cholecalciferol   1,000 Units Oral Daily   cyanocobalamin   1,000 mcg Oral Daily   fluticasone   2 spray Each Nare Daily   furosemide   20 mg Oral Daily   insulin  aspart  0-5 Units Subcutaneous QHS   insulin  aspart  0-9 Units Subcutaneous TID WC   isosorbide  mononitrate  30 mg Oral Daily   losartan   50 mg Oral Daily   melatonin  10 mg Oral QHS   metoprolol  tartrate  50 mg Oral BID   pantoprazole   40 mg Oral BID   ranolazine   500 mg Oral BID   sodium chloride  flush  3 mL Intravenous Q12H   Continuous Infusions:  sodium chloride      heparin  1,200 Units/hr (01/03/24 0511)   PRN Meds: sodium chloride , acetaminophen , ALPRAZolam , magnesium  hydroxide, nitroGLYCERIN , ondansetron  (ZOFRAN ) IV, sodium chloride  flush, traZODone    Vital Signs  Vitals:   01/02/24 1925 01/02/24 2313 01/03/24 0437 01/03/24 0741  BP: (!) 166/71 122/76 127/71 137/67  Pulse: 72 (!) 56 (!) 57 (!) 59  Resp: 18 16 16    Temp: 97.7 F (36.5 C) (!) 97.4 F (36.3 C) (!) 97.5 F (36.4 C) 97.9 F (36.6 C)  TempSrc:      SpO2: 100% 97% 96% 99%  Weight:      Height:        Intake/Output Summary (Last 24 hours) at 01/03/2024 1104 Last data filed at 01/03/2024 0900 Gross per 24 hour  Intake 1233.82 ml  Output --  Net 1233.82 ml      01/02/2024    2:07 PM 01/01/2024   10:06 PM 08/26/2023    8:44 AM  Last 3 Weights  Weight (lbs) 214 lb 220 lb 210 lb 9.6 oz  Weight (kg) 97.07 kg 99.791 kg 95.528 kg      Telemetry Sinus rhythm, rate 50s-70s bpm - Personally Reviewed  Physical Exam  GEN: No acute  distress.   Neck: No JVD Cardiac: RRR, no murmurs, rubs, or gallops.  Respiratory: Clear to auscultation bilaterally. GI: Soft, nontender, non-distended  MS: No edema; No deformity. Neuro:  Nonfocal  Psych: Normal affect   Labs High Sensitivity Troponin:   Recent Labs  Lab 01/01/24 2213 01/02/24 0004 01/02/24 0825  TROPONINIHS 125* 1,199* 5,240*     Chemistry Recent Labs  Lab 01/01/24 2213 01/02/24 0426 01/03/24 0458  NA 128* 133* 136  K 3.5 3.7 3.6  CL 92* 96* 102  CO2 24 24 25   GLUCOSE 163* 141* 132*  BUN 15 13 10   CREATININE 0.73 0.76 0.48*  CALCIUM  9.1 9.4 8.9  GFRNONAA >60 >60 >60  ANIONGAP 12 13 9     Lipids  Recent Labs  Lab 01/02/24 0426  CHOL 119  TRIG 132  HDL 60  LDLCALC 33  CHOLHDL 2.0    Hematology Recent Labs  Lab 01/01/24 2213 01/02/24 0426 01/03/24 0458  WBC 6.5 6.1 5.3  RBC 3.18* 3.24* 3.09*  HGB 12.1* 12.0* 11.5*  HCT 33.3* 34.0* 32.7*  MCV 104.7* 104.9* 105.8*  MCH 38.1* 37.0* 37.2*  MCHC 36.3*  35.3 35.2  RDW 11.6 11.8 12.1  PLT 187 188 178   Thyroid  No results for input(s): TSH, FREET4 in the last 168 hours.  BNPNo results for input(s): BNP, PROBNP in the last 168 hours.  DDimer No results for input(s): DDIMER in the last 168 hours.   Radiology  DG Chest 2 View Result Date: 01/01/2024 IMPRESSION: No active cardiopulmonary disease. Electronically Signed   By: Suzen Dials M.D.   On: 01/01/2024 22:42   Cardiac Studies  01/03/2024 Echo complete 1. Left ventricular ejection fraction, by estimation, is 60 to 65%. The  left ventricle has normal function. The left ventricle has no regional  wall motion abnormalities. There is mild left ventricular hypertrophy.  Left ventricular diastolic parameters  are consistent with Grade I diastolic dysfunction (impaired relaxation).   2. Right ventricular systolic function is normal. The right ventricular  size is normal.   3. The mitral valve is normal in structure. No  evidence of mitral valve  regurgitation.   4. The aortic valve was not well visualized. Aortic valve regurgitation  is not visualized.   5. The inferior vena cava is normal in size with greater than 50%  respiratory variability, suggesting right atrial pressure of 3 mmHg.   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   Mid LM to Prox LAD lesion is 20% stenosed with 40% stenosed side branch in Ost Cx.   Prox LAD lesion is 95% stenosed. Dist LAD lesion is 40% stenosed.   Ost Cx to Prox Cx lesion is 90% stenosed.  Mid Cx lesion is 50% stenosed.  Mid Cx to Dist Cx lesion is 60% stenosed with 50% stenosed side branch in 3rd Mrg.   Prox RCA to Dist RCA lesion is 100% stenosed with 100% stenosed side branch in RPAV.   --------------------------------------------------   LV end diastolic pressure is mildly elevated.  There is no aortic valve stenosis.   --------------------------------------------------   Dominance: Right:     RECOMMENDATION   Multivessel CAD-recommend CABG x 3.  Patient be transferred to [facility] for CVTS consultation. Continue to titrate GDMT for CAD.  Anticipated discharge date to be determined, but okay for discharge.  on 01/02/2024.   Restart IV Heparin  for ACS/Afib --for coverage until CABG.  Recommend concurrent antiplatelet therapy of Aspirin  81 mg daily for 6 months.  Patient Profile   78 y.o. male with a history of CAD, hypertension, hepatic steatosis, hyperlipidemia, aortic atherosclerosis, anxiety, cardiac valve disease who is being see for the conitnued evaluation of NSTEMI and new onset atrial fibrillation. Patient underwent LHC on 7/10 which reveal severe multivessel CAD. He is pending transfer to [name] for CABG evaluation.   Assessment & Plan   NSTEMI CAD - History of CAD with prior 2022 showing multivessel CAD without targets for PCI - Patient  presenting with unstable angina, found to have new onset atrial fibrillation - Troponin peaked at 5240 - LHC this admission revealed severe multivessel CAD as detailed above with recommendation for transfer to [facility] for CABG evaluation, which is pending as of 7/10 - Cath site is stable without signs of hematoma or bleeding - Echo with EF 60 to 65%, no RWMA, G1DD - Continue IV heparin  - Continue aspirin  81 mg daily, Lopressor  50 mg twice daily, Imdur  30 mg daily, Ranexa  500 mg twice daily, and Lipitor  80 mg daily  New onset  atrial fibrillation - Patient presenting with new onset atrial fibrillation and converted to sinus rhythm 7/10 around 4 AM - Echo with preserved EF - Maintaining sinus rhythm - He is continued on Lopressor  50 mg twice daily - CHA2DS2-VASc at least 5 - Continue IV heparin  with plan to transition to DOAC after procedure  Hypertension - Blood pressure reasonably well-controlled - Continue Imdur  30 mg daily, losartan  50 mg daily, Lopressor  50 mg twice daily  HFpEF - Echo with preserved EF as above, G1 DD - Patient appears euvolemic on exam - Continue PTA Lasix  20 mg daily  Hyperlipidemia - LDL 33, continue Lipitor  80 mg daily  For questions or updates, please contact [facility] Please consult [email or web address] for contact info under     Signed, Lesley LITTIE Maffucci, PA-C  01/03/2024, 11:04 AM

## 2024-01-03 NOTE — Plan of Care (Signed)
  Problem: Cardiac: Goal: Ability to achieve and maintain adequate cardiovascular perfusion will improve Outcome: Progressing   Problem: Cardiovascular: Goal: Vascular access site(s) Level 0-1 will be maintained Outcome: Progressing   Problem: Clinical Measurements: Goal: Ability to maintain clinical measurements within normal limits will improve Outcome: Progressing   Problem: Clinical Measurements: Goal: Cardiovascular complication will be avoided Outcome: Progressing   Problem: Safety: Goal: Ability to remain free from injury will improve Outcome: Progressing   Problem: Pain Managment: Goal: General experience of comfort will improve and/or be controlled Outcome: Progressing

## 2024-01-03 NOTE — TOC CM/SW Note (Signed)
 Transition of Care [facility]) - Inpatient Brief Assessment   Patient Details  Name: Daniel Holden MRN: [id number] Date of Birth: 09-22-1945  [facility]) CM/SW Contact:    Lauraine JAYSON Carpen, LCSW Phone Number: 01/03/2024, 9:18 AM   Clinical Narrative: Patient has orders to transfer to [facility]. CSW reviewed chart. No TOC needs identified so far. CSW will continue to follow progress. Please place consult if any TOC needs arise.  Transition of Care Asessment: Insurance and Status: Insurance coverage has been reviewed Patient has primary care physician: Yes Home environment has been reviewed: Single family home Prior level of function:: Not documented Prior/Current Home Services: No current home services Social Drivers of Health Review: SDOH reviewed no interventions necessary Readmission risk has been reviewed: Yes Transition of care needs: no transition of care needs at this time

## 2024-01-03 NOTE — H&P (View-Only) (Signed)
 Reason for Consult:3 vessel CAD s/p nonSTEMI Referring Physician: Dr. Anner- cath, Dr. Shlomo primary Cards   Daniel Holden is an 78 y.o. male.  HPI: c/o of CP  Daniel Holden is a 78 yo man with a known history of CAD and chronic stable angina.  History of hypertension, hyperlipidemia, sleep apnea, pancreatitis, reflux.  Presented with a 3 day history of accelerating CP.  Increased frequency and severity. Presented after prolonged episode on Wednesday lasting over 2 hours.  Also noted irregular HR.  Was found to be in new onset atrial fib with RVR.  Initial troponin 125, increased to  5240.   Underwent cath- CTO of RCA, 95% LAD and 90% circumflex stenoses.  Bifurcated LAD with proximal septal perforator branch and larger anterior apical branch.  Echo showed normal EF with no significant valvular pathology.  Currently pain free.  Past Medical History:  Diagnosis Date   Allergy    Angina of effort (HCC)    Anxiety    CAD (coronary artery disease), native coronary artery    coronary CTA showing aortic atherosclerosis with very high coronary Ca score at 4630 with severely calcified RCA and LAD > 70% and 50-69% prox to mid LCx.     Cancer (HCC)    Carpal tunnel syndrome    GERD (gastroesophageal reflux disease)    HLD (hyperlipidemia)    Hypertension    Lung nodule    Obesity    Pancreatitis 2015   Sleep apnea     Past Surgical History:  Procedure Laterality Date   CARPAL TUNNEL RELEASE     COLONOSCOPY WITH PROPOFOL  N/A 06/27/2021   Procedure: COLONOSCOPY WITH PROPOFOL ;  Surgeon: Maryruth Ole DASEN, MD;  Location: [facility];  Service: Endoscopy;  Laterality: N/A;   ESOPHAGOGASTRODUODENOSCOPY (EGD) WITH PROPOFOL  N/A 06/27/2021   Procedure: ESOPHAGOGASTRODUODENOSCOPY (EGD) WITH PROPOFOL ;  Surgeon: Maryruth Ole DASEN, MD;  Location: [facility];  Service: Endoscopy;  Laterality: N/A;   ETHMOIDECTOMY Right 03/20/2021   Procedure: ANTERIOR ETHMOIDECTOMY;  Surgeon: Blair Mt,  MD;  Location: [facility] ORS;  Service: ENT;  Laterality: Right;   IMAGE GUIDED SINUS SURGERY N/A 03/20/2021   Procedure: IMAGE GUIDED SINUS SURGERY;  Surgeon: Blair Mt, MD;  Location: [facility] ORS;  Service: ENT;  Laterality: N/A;   KNEE ARTHROSCOPY WITH MEDIAL MENISECTOMY Left 06/25/2019   Procedure: KNEE ARTHROSCOPY WITH MEDIAL MENISECTOMY;  Surgeon: Marchia Drivers, MD;  Location: [facility] ORS;  Service: Orthopedics;  Laterality: Left;   LEFT HEART CATH AND CORONARY ANGIOGRAPHY N/A 10/04/2020   Procedure: LEFT HEART CATH AND CORONARY ANGIOGRAPHY;  Surgeon: Anner Alm ORN, MD;  Location: [facility] INVASIVE CV LAB;  Service: Cardiovascular;  Laterality: N/A;   LEFT HEART CATH AND CORONARY ANGIOGRAPHY N/A 01/02/2024   Procedure: LEFT HEART CATH AND CORONARY ANGIOGRAPHY;  Surgeon: Anner Alm ORN, MD;  Location: [facility] INVASIVE CV LAB;  Service: Cardiovascular;  Laterality: N/A;   MAXILLARY ANTROSTOMY Right 03/20/2021   Procedure: MAXILLARY ANTROSTOMY WITH TISSUE REMOVAL;  Surgeon: Blair Mt, MD;  Location: [facility] ORS;  Service: ENT;  Laterality: Right;   SHOULDER SURGERY     TONSILLECTOMY      Family History  Problem Relation Age of Onset   CVA Father     Social History:  reports that he has never smoked. He has never used smokeless tobacco. He reports current alcohol use of about 42.0 standard drinks of alcohol per week. He reports that he does not use drugs.  Allergies:  Allergies  Allergen Reactions  Metronidazole  Other (See Comments)    Patient stated that he had a moderate level of pain with his allergic reaction.    Ace Inhibitors Cough   Dilaudid  [Hydromorphone  Hcl] Nausea And Vomiting    Medications: Scheduled:  aspirin  EC  81 mg Oral Daily   aspirin  EC  81 mg Oral Daily   atorvastatin   80 mg Oral Daily   furosemide   20 mg Oral Daily   isosorbide  mononitrate  30 mg Oral Daily   losartan   50 mg Oral Daily   metoprolol  tartrate  50 mg Oral BID   ranolazine   500 mg Oral BID    sodium chloride  flush  3 mL Intravenous Q12H    Results for orders placed or performed during the hospital encounter of 01/01/24 (from the past 48 hours)  Basic metabolic panel     Status: Abnormal   Collection Time: 01/01/24 10:13 PM  Result Value Ref Range   Sodium 128 (L) 135 - 145 mmol/L   Potassium 3.5 3.5 - 5.1 mmol/L   Chloride 92 (L) 98 - 111 mmol/L   CO2 24 22 - 32 mmol/L   Glucose, Bld 163 (H) 70 - 99 mg/dL    Comment: Glucose reference range applies only to samples taken after fasting for at least 8 hours.   BUN 15 8 - 23 mg/dL   Creatinine, Ser 9.26 0.61 - 1.24 mg/dL   Calcium  9.1 8.9 - 10.3 mg/dL   GFR, Estimated >39 >39 mL/min    Comment: (NOTE) Calculated using the CKD-EPI Creatinine Equation (2021)    Anion gap 12 5 - 15    Comment: Performed at [facility], [address] [place], [place] [address]  CBC     Status: Abnormal   Collection Time: 01/01/24 10:13 PM  Result Value Ref Range   WBC 6.5 4.0 - 10.5 K/uL   RBC 3.18 (L) 4.22 - 5.81 MIL/uL   Hemoglobin 12.1 (L) 13.0 - 17.0 g/dL   HCT 66.6 (L) 60.9 - 47.9 %   MCV 104.7 (H) 80.0 - 100.0 fL   MCH 38.1 (H) 26.0 - 34.0 pg   MCHC 36.3 (H) 30.0 - 36.0 g/dL   RDW 88.3 88.4 - 84.4 %   Platelets 187 150 - 400 K/uL   nRBC 0.0 0.0 - 0.2 %    Comment: Performed at [facility], [address] [place], [place] [address]  Troponin I (High Sensitivity)     Status: Abnormal   Collection Time: 01/01/24 10:13 PM  Result Value Ref Range   Troponin I (High Sensitivity) 125 (HH) <18 ng/L    Comment: CRITICAL RESULT CALLED TO, READ BACK BY AND VERIFIED WITH Lang Ada RN @2250  on 01/01/24 SKL (NOTE) Elevated high sensitivity troponin I (hsTnI) values and significant  changes across serial measurements may suggest ACS but many other  chronic and acute conditions are known to elevate hsTnI results.  Refer to the Links section for chest pain algorithms and additional  guidance. Performed at [facility], [address] [place], [place] [address]   APTT     Status: None   Collection Time: 01/02/24 12:04 AM  Result Value Ref Range   aPTT 28 24 - 36 seconds    Comment: Performed at [facility], [address] [place], [place] [address]  Protime-INR     Status: None   Collection Time: 01/02/24 12:04 AM  Result Value Ref Range   Prothrombin Time 14.4 11.4 - 15.2  seconds   INR 1.1 0.8 - 1.2    Comment: (NOTE) INR goal varies based on device and disease states. Performed at [facility], [address] [place], [place] [address]   Troponin I (High Sensitivity)     Status: Abnormal   Collection Time: 01/02/24 12:04 AM  Result Value Ref Range   Troponin I (High Sensitivity) 1,199 (HH) <18 ng/L    Comment: CRITICAL VALUE NOTED. VALUE IS CONSISTENT WITH PREVIOUSLY REPORTED/CALLED VALUE SKL (NOTE) Elevated high sensitivity troponin I (hsTnI) values and significant  changes across serial measurements may suggest ACS but many other  chronic and acute conditions are known to elevate hsTnI results.  Refer to the Links section for chest pain algorithms and additional  guidance. Performed at [facility], [address] [place], [place] [address]   Basic metabolic panel     Status: Abnormal   Collection Time: 01/02/24  4:26 AM  Result Value Ref Range   Sodium 133 (L) 135 - 145 mmol/L   Potassium 3.7 3.5 - 5.1 mmol/L   Chloride 96 (L) 98 - 111 mmol/L   CO2 24 22 - 32 mmol/L   Glucose, Bld 141 (H) 70 - 99 mg/dL    Comment: Glucose reference range applies only to samples taken after fasting for at least 8 hours.   BUN 13 8 - 23 mg/dL   Creatinine, Ser 9.23 0.61 - 1.24 mg/dL   Calcium  9.4 8.9 - 10.3 mg/dL   GFR, Estimated >39 >39 mL/min    Comment: (NOTE) Calculated using the CKD-EPI Creatinine Equation (2021)    Anion gap 13 5 - 15    Comment: Performed at [facility], [address] [place], [place] [address]  Lipid panel      Status: None   Collection Time: 01/02/24  4:26 AM  Result Value Ref Range   Cholesterol 119 0 - 200 mg/dL   Triglycerides 867 <849 mg/dL   HDL 60 >59 mg/dL   Total CHOL/HDL Ratio 2.0 RATIO   VLDL 26 0 - 40 mg/dL   LDL Cholesterol 33 0 - 99 mg/dL    Comment:        Total Cholesterol/HDL:CHD Risk Coronary Heart Disease Risk Table                     Men   Women  1/2 Average Risk   3.4   3.3  Average Risk       5.0   4.4  2 X Average Risk   9.6   7.1  3 X Average Risk  23.4   11.0        Use the calculated Patient Ratio above and the CHD Risk Table to determine the patient's CHD Risk.        ATP III CLASSIFICATION (LDL):  <100     mg/dL   Optimal  899-870  mg/dL   Near or Above                    Optimal  130-159  mg/dL   Borderline  839-810  mg/dL   High  >809     mg/dL   Very High Performed at [facility], [address] [place], [place] [address]   CBC     Status: Abnormal   Collection Time: 01/02/24  4:26 AM  Result Value Ref Range   WBC 6.1 4.0 - 10.5 K/uL   RBC 3.24 (L) 4.22 - 5.81  MIL/uL   Hemoglobin 12.0 (L) 13.0 - 17.0 g/dL   HCT 65.9 (L) 60.9 - 47.9 %   MCV 104.9 (H) 80.0 - 100.0 fL   MCH 37.0 (H) 26.0 - 34.0 pg   MCHC 35.3 30.0 - 36.0 g/dL   RDW 88.1 88.4 - 84.4 %   Platelets 188 150 - 400 K/uL   nRBC 0.0 0.0 - 0.2 %    Comment: Performed at [facility], [address] [place], [place] [address]  Protime-INR     Status: None   Collection Time: 01/02/24  4:26 AM  Result Value Ref Range   Prothrombin Time 14.6 11.4 - 15.2 seconds   INR 1.1 0.8 - 1.2    Comment: (NOTE) INR goal varies based on device and disease states. Performed at [facility], [address] [place], [place] [address]   Hemoglobin A1c     Status: Abnormal   Collection Time: 01/02/24  4:26 AM  Result Value Ref Range   Hgb A1c MFr Bld 6.1 (H) 4.8 - 5.6 %    Comment: (NOTE)         Prediabetes: 5.7 - 6.4         Diabetes: >6.4         Glycemic  control for adults with diabetes: <7.0    Mean Plasma Glucose 128 mg/dL    Comment: (NOTE) Performed At: [facility] [address] [place], [place] [id number] Jennette Shorter MD Ey:[id number]   CBG monitoring, ED     Status: Abnormal   Collection Time: 01/02/24  7:22 AM  Result Value Ref Range   Glucose-Capillary 142 (H) 70 - 99 mg/dL    Comment: Glucose reference range applies only to samples taken after fasting for at least 8 hours.  Heparin  level (unfractionated)     Status: Abnormal   Collection Time: 01/02/24  7:34 AM  Result Value Ref Range   Heparin  Unfractionated 0.28 (L) 0.30 - 0.70 IU/mL    Comment: (NOTE) The clinical reportable range upper limit is being lowered to >1.10 to align with the FDA approved guidance for the current laboratory assay.  If heparin  results are below expected values, and patient dosage has  been confirmed, suggest follow up testing of antithrombin III levels. Performed at [facility], [address] [place], [place] [address]   Troponin I (High Sensitivity)     Status: Abnormal   Collection Time: 01/02/24  8:25 AM  Result Value Ref Range   Troponin I (High Sensitivity) 5,240 (HH) <18 ng/L    Comment: CRITICAL VALUE NOTED. VALUE IS CONSISTENT WITH PREVIOUSLY REPORTED/CALLED VALUE HNM (NOTE) Elevated high sensitivity troponin I (hsTnI) values and significant  changes across serial measurements may suggest ACS but many other  chronic and acute conditions are known to elevate hsTnI results.  Refer to the Links section for chest pain algorithms and additional  guidance. Performed at [facility], [address] [place], [place] [address]   CBG monitoring, ED     Status: Abnormal   Collection Time: 01/02/24 12:02 PM  Result Value Ref Range   Glucose-Capillary 136 (H) 70 - 99 mg/dL    Comment: Glucose reference range applies only to samples taken after fasting for at least 8 hours.  Glucose, capillary      Status: Abnormal   Collection Time: 01/02/24  8:23 PM  Result Value Ref Range   Glucose-Capillary 120 (H) 70 - 99 mg/dL    Comment: Glucose reference range  applies only to samples taken after fasting for at least 8 hours.  Basic metabolic panel with GFR     Status: Abnormal   Collection Time: 01/03/24  4:58 AM  Result Value Ref Range   Sodium 136 135 - 145 mmol/L   Potassium 3.6 3.5 - 5.1 mmol/L   Chloride 102 98 - 111 mmol/L   CO2 25 22 - 32 mmol/L   Glucose, Bld 132 (H) 70 - 99 mg/dL    Comment: Glucose reference range applies only to samples taken after fasting for at least 8 hours.   BUN 10 8 - 23 mg/dL   Creatinine, Ser 9.51 (L) 0.61 - 1.24 mg/dL   Calcium  8.9 8.9 - 10.3 mg/dL   GFR, Estimated >39 >39 mL/min    Comment: (NOTE) Calculated using the CKD-EPI Creatinine Equation (2021)    Anion gap 9 5 - 15    Comment: Performed at [facility], [address] [place], [place] [address]  CBC     Status: Abnormal   Collection Time: 01/03/24  4:58 AM  Result Value Ref Range   WBC 5.3 4.0 - 10.5 K/uL   RBC 3.09 (L) 4.22 - 5.81 MIL/uL   Hemoglobin 11.5 (L) 13.0 - 17.0 g/dL   HCT 67.2 (L) 60.9 - 47.9 %   MCV 105.8 (H) 80.0 - 100.0 fL   MCH 37.2 (H) 26.0 - 34.0 pg   MCHC 35.2 30.0 - 36.0 g/dL   RDW 87.8 88.4 - 84.4 %   Platelets 178 150 - 400 K/uL   nRBC 0.0 0.0 - 0.2 %    Comment: Performed at [facility], [address] [place], [place] [address]  Heparin  level (unfractionated)     Status: None   Collection Time: 01/03/24  4:58 AM  Result Value Ref Range   Heparin  Unfractionated 0.40 0.30 - 0.70 IU/mL    Comment: (NOTE) The clinical reportable range upper limit is being lowered to >1.10 to align with the FDA approved guidance for the current laboratory assay.  If heparin  results are below expected values, and patient dosage has  been confirmed, suggest follow up testing of antithrombin III levels. Performed at [facility], [address] [place], [place] [address]   Glucose, capillary     Status: Abnormal   Collection Time: 01/03/24  7:42 AM  Result Value Ref Range   Glucose-Capillary 144 (H) 70 - 99 mg/dL    Comment: Glucose reference range applies only to samples taken after fasting for at least 8 hours.  Glucose, capillary     Status: Abnormal   Collection Time: 01/03/24 11:27 AM  Result Value Ref Range   Glucose-Capillary 170 (H) 70 - 99 mg/dL    Comment: Glucose reference range applies only to samples taken after fasting for at least 8 hours.  Heparin  level (unfractionated)     Status: None   Collection Time: 01/03/24  1:01 PM  Result Value Ref Range   Heparin  Unfractionated 0.35 0.30 - 0.70 IU/mL    Comment: (NOTE) The clinical reportable range upper limit is being lowered to >1.10 to align with the FDA approved guidance for the current laboratory assay.  If heparin  results are below expected values, and patient dosage has  been confirmed, suggest follow up testing of antithrombin III levels. Performed at [facility], [address] [place], [place] [address]     ECHOCARDIOGRAM COMPLETE Result Date: 01/03/2024    ECHOCARDIOGRAM REPORT   Patient Name:  Daniel Holden Date of Exam: 01/03/2024 Medical Rec #:  [id number]        Height:       66.0 in Accession #:    [id number]       Weight:       214.0 lb Date of Birth:  [birth year]        BSA:          2.058 m Patient Age:    77 years         BP:           137/67 mmHg Patient Gender: M                HR:           33 bpm. Exam Location:  [facility] Procedure: 2D Echo, Cardiac Doppler and Color Doppler (Both Spectral and Color            Flow Doppler were utilized during procedure). Indications:     NSTEMI  History:         Patient has prior history of Echocardiogram examinations, most                  recent 01/11/2022. CAD, Arrythmias:Atrial Fibrillation,                  Signs/Symptoms:Hypotension; Risk Factors:Hypertension.  Sonographer:     Philomena Daring  Referring Phys:  [id number] GJW A MANSY Diagnosing Phys: Redell Cave MD  Sonographer Comments: Patient is obese and Technically difficult study due to poor echo windows. Image acquisition challenging due to patient body habitus. IMPRESSIONS  1. Left ventricular ejection fraction, by estimation, is 60 to 65%. The left ventricle has normal function. The left ventricle has no regional wall motion abnormalities. There is mild left ventricular hypertrophy. Left ventricular diastolic parameters are consistent with Grade I diastolic dysfunction (impaired relaxation).  2. Right ventricular systolic function is normal. The right ventricular size is normal.  3. The mitral valve is normal in structure. No evidence of mitral valve regurgitation.  4. The aortic valve was not well visualized. Aortic valve regurgitation is not visualized.  5. The inferior vena cava is normal in size with greater than 50% respiratory variability, suggesting right atrial pressure of 3 mmHg. FINDINGS  Left Ventricle: Left ventricular ejection fraction, by estimation, is 60 to 65%. The left ventricle has normal function. The left ventricle has no regional wall motion abnormalities. The left ventricular internal cavity size was normal in size. There is  mild left ventricular hypertrophy. Left ventricular diastolic parameters are consistent with Grade I diastolic dysfunction (impaired relaxation). Right Ventricle: The right ventricular size is normal. No increase in right ventricular wall thickness. Right ventricular systolic function is normal. Left Atrium: Left atrial size was normal in size. Right Atrium: Right atrial size was normal in size. Pericardium: There is no evidence of pericardial effusion. Mitral Valve: The mitral valve is normal in structure. No evidence of mitral valve regurgitation. Tricuspid Valve: The tricuspid valve is normal in structure. Tricuspid valve regurgitation is mild. Aortic Valve: The aortic valve was not well  visualized. Aortic valve regurgitation is not visualized. Pulmonic Valve: The pulmonic valve was not well visualized. Pulmonic valve regurgitation is not visualized. Aorta: The aortic root and ascending aorta are structurally normal, with no evidence of dilitation. Venous: The inferior vena cava is normal in size with greater than 50% respiratory variability, suggesting right atrial pressure of 3 mmHg. IAS/Shunts: No atrial level shunt detected by color  flow Doppler.  LEFT VENTRICLE PLAX 2D LVIDd:         4.39 cm   Diastology LV PW:         1.24 cm   LV e' medial:    4.45 cm/s LV IVS:        1.33 cm   LV E/e' medial:  17.9 LVOT diam:     2.10 cm   LV e' lateral:   9.38 cm/s LV SV:         107       LV E/e' lateral: 8.5 LV SV Index:   52 LVOT Area:     3.46 cm  RIGHT VENTRICLE             IVC RV S prime:     12.20 cm/s  IVC diam: 2.14 cm TAPSE (M-mode): 1.7 cm LEFT ATRIUM             Index        RIGHT ATRIUM           Index LA Vol (A2C):   59.9 ml 29.10 ml/m  RA Area:     18.00 cm LA Vol (A4C):   54.4 ml 26.43 ml/m  RA Volume:   45.80 ml  22.25 ml/m LA Biplane Vol: 59.1 ml 28.72 ml/m  AORTIC VALVE LVOT Vmax:   139.00 cm/s LVOT Vmean:  80.400 cm/s LVOT VTI:    0.310 m  AORTA Ao Root diam: 2.70 cm Ao Asc diam:  3.00 cm MITRAL VALVE                TRICUSPID VALVE MV Area (PHT): 3.01 cm     TR Peak grad:   6.8 mmHg MV Decel Time: 252 msec     TR Vmax:        130.00 cm/s MV E velocity: 79.50 cm/s MV A velocity: 100.00 cm/s  SHUNTS MV E/A ratio:  0.80         Systemic VTI:  0.31 m                             Systemic Diam: 2.10 cm Redell Cave MD Electronically signed by Redell Cave MD Signature Date/Time: 01/03/2024/9:50:03 AM    Final    CARDIAC CATHETERIZATION Result Date: 01/02/2024 Images from the original result were not included.   Severe multivessel CAD with known occlusion of the RCA and progression of proximal LAD/LCx stenosis to 95% LAD and 90% LCx.   Mid LM to Prox LAD lesion is 20%  stenosed with 40% stenosed side branch in Ost Cx.   Prox LAD lesion is 95% stenosed. Dist LAD lesion is 40% stenosed.   Ost Cx to Prox Cx lesion is 90% stenosed.  Mid Cx lesion is 50% stenosed.  Mid Cx to Dist Cx lesion is 60% stenosed with 50% stenosed side branch in 3rd Mrg.   Prox RCA to Dist RCA lesion is 100% stenosed with 100% stenosed side branch in RPAV.   --------------------------------------------------   LV end diastolic pressure is mildly elevated.  There is no aortic valve stenosis.   -------------------------------------------------- Dominance: Right: RECOMMENDATION   Multivessel CAD-recommend CABG x 3.  Patient be transferred to [facility] for CVTS consultation. Continue to titrate GDMT for CAD.  Anticipated discharge date to be determined, but okay for discharge.  on 01/02/2024.   Restart IV Heparin  for ACS/Afib --for coverage until CABG.  Recommend concurrent antiplatelet therapy  of Aspirin  81 mg daily for 6 months. Alm Clay, MD  DG Chest 2 View Result Date: 01/01/2024 CLINICAL DATA:  Chest pain. EXAM: CHEST - 2 VIEW COMPARISON:  None Available. FINDINGS: The heart size and mediastinal contours are within normal limits. Both lungs are clear. The visualized skeletal structures are unremarkable. IMPRESSION: No active cardiopulmonary disease. Electronically Signed   By: Suzen Dials M.D.   On: 01/01/2024 22:42   I personally reviewed the cath images.  Severe 3 vessel CAD  Review of Systems  Constitutional:  Negative for activity change and unexpected weight change.  HENT:  Negative for trouble swallowing and voice change.   Eyes:  Negative for visual disturbance.  Respiratory:  Positive for shortness of breath.   Cardiovascular:  Positive for chest pain and palpitations.  Gastrointestinal:  Positive for abdominal pain (reflux).  Neurological:  Negative for syncope and weakness.   Blood pressure 116/66, pulse 62, temperature 98 F (36.7 C), temperature source Oral,  resp. rate 16, SpO2 96%. Physical Exam Vitals reviewed.  Constitutional:      General: He is not in acute distress.    Appearance: Normal appearance.  HENT:     Head: Normocephalic and atraumatic.  Eyes:     General: No scleral icterus.    Extraocular Movements: Extraocular movements intact.  Neck:     Vascular: No carotid bruit.  Cardiovascular:     Rate and Rhythm: Normal rate and regular rhythm.     Heart sounds: Normal heart sounds. No murmur heard.    No friction rub. No gallop.     Comments: Normal Allen's test on left Pulmonary:     Effort: Pulmonary effort is normal. No respiratory distress.     Breath sounds: Normal breath sounds. No wheezing.  Abdominal:     General: There is no distension.     Palpations: Abdomen is soft.  Musculoskeletal:     Cervical back: Neck supple.  Skin:    General: Skin is warm and dry.  Neurological:     General: No focal deficit present.     Mental Status: He is alert and oriented to person, place, and time.     Cranial Nerves: No cranial nerve deficit.     Motor: No weakness.     Assessment/Plan: 78 yo man with multiple CRF and known CAD with long standing chronic stable angina presents with 3 day history of accelerating angina and r/I for non-STEMI.  At cath has severe 3 vessel CAD.  Also in new onset atrial fibrillation at presentation.  CABG indicated for survival benefit and relief of symptoms.  I also offered him the option of PVI and LAA clip at time of CABG.  I discussed the general nature of the procedure, including the need for general anesthesia, the incisions to be used, the use of cardiopulmonary bypass, and the use of temporary pacemaker wires and drainage tubes postoperatively with Daniel Holden.  We discussed the expected hospital stay, overall recovery and short and long term outcomes. I informed him of the indications, risks, benefits and alternatives.   He understands the risks include, but are not limited to death,  stroke, MI, DVT/PE, bleeding, possible need for transfusion, infections, cardiac arrhythmias, as well as other organ system dysfunction including respiratory, renal, or GI complications.    He accepts the risks and agrees to proceed.  Plan OR Monday 7/14  Elspeth JAYSON Millers 01/03/2024, 5:15 PM

## 2024-01-03 NOTE — Discharge Summary (Signed)
 Physician Discharge Summary   Patient: Daniel Holden MRN: [id number] DOB: 11-25-1945  Admit date:     01/01/2024  Discharge date: 01/03/24  Discharge Physician: Drue ONEIDA Potter   PCP: Sherial Bail, MD   Recommendations at discharge:  Being transferred for further management      Discharge Diagnoses:  Principal Problem:   NSTEMI (non-ST elevated myocardial infarction) [facility]) Coronary artery disease Active Problems:   Atrial fibrillation, new onset (HCC)   Essential hypertension   Dyslipidemia   OSA on CPAP   Anxiety   Primary hypertension   Type 2 diabetes mellitus without complications (HCC)   GERD without esophagitis   CAD (coronary artery disease)     Discharge Condition: stable   Diet recommendation: heart healthy   History of present illness:  From admission h and p Daniel Holden is a 78 y.o. Caucasian male with medical history significant for anxiety, coronary artery disease, GERD, hypertension, dyslipidemia, and pancreatitis as well as OSA, who presented to the ER with acute onset of left parasternal chest pressure that was intense and was graded 9-10/10 in severity with radiation to his left arm without nausea or vomiting or diaphoresis.  He admitted to dyspnea before his chest pain but did not any palpitations.  No fever or chills.  No cough or wheezing or hemoptysis.  No leg pain or edema or recent travels or surgeries.  No dysuria, oliguria or hematuria or flank pain.  No bleeding diathesis.     Hospital Course:    Hx CAD presenting with chest pain, found to have nstemi, started on heparin , on hospital day 1 chest pain resolved, taken for LHC, showing multivessel CAD-recommend CABG x 3. Patient be transferred to [facility] for CVTS consultation. Echocardiogram showed EF 60 to 65% with grade 1 diastolic dysfunction.  Also found to be in new a-fib rvr on arrival, has converted to sinus. Other chronic conditions stable.    Procedures: Left  heart cath    Consultations: cardiology    Diet recommendation:  Discharge Diet Orders (From admission, onward)     Start     Ordered   01/02/24 0000  Diet - low sodium heart healthy        01/02/24 1650            DISCHARGE MEDICATION: Allergies as of 01/03/2024       Reactions   Metronidazole  Other (See Comments)   Patient stated that he had a moderate level of pain with his allergic reaction.    Ace Inhibitors Cough   Dilaudid  [hydromorphone  Hcl] Nausea And Vomiting        Medication List     PAUSE taking these medications    amLODipine  5 MG tablet Wait to take this until your doctor or other care provider tells you to start again. Commonly known as: NORVASC  TAKE 1 TABLET EVERY DAY       STOP taking these medications    naproxen  sodium 220 MG tablet Commonly known as: ALEVE    Tylenol  325 MG Caps Generic drug: Acetaminophen        TAKE these medications    ALPRAZolam  0.25 MG tablet Commonly known as: XANAX  Take 1 tablet (0.25 mg total) by mouth at bedtime as needed for sleep.   aspirin  EC 81 MG tablet Take 81 mg by mouth daily.   atorvastatin  80 MG tablet Commonly known as: LIPITOR  TAKE 1 TABLET EVERY DAY   Cholecalciferol  250 MCG (10000 UT) Tabs Take 1,000 Units by  mouth daily.   cyanocobalamin  1000 MCG tablet Take 1,000 mcg by mouth daily.   fluticasone  50 MCG/ACT nasal spray Commonly known as: FLONASE  Place 2 sprays into both nostrils daily.   furosemide  20 MG tablet Commonly known as: LASIX  Take 1 tablet (20 mg total) by mouth daily.   heparin  25000 UT/250ML infusion Inject 1,200 Units/hr into the vein continuous.   isosorbide  mononitrate 30 MG 24 hr tablet Commonly known as: IMDUR  TAKE 1 TABLET EVERY DAY   losartan  50 MG tablet Commonly known as: COZAAR  TAKE 1 TABLET EVERY DAY   melatonin 3 MG Tabs tablet Take 10 mg by mouth at bedtime.   metoprolol  tartrate 50 MG tablet Commonly known as: LOPRESSOR  TAKE 1 TABLET  TWICE DAILY   omeprazole  20 MG tablet Commonly known as: PRILOSEC  OTC Take 20 mg by mouth daily.   ranolazine  500 MG 12 hr tablet Commonly known as: Ranexa  Take 1 tablet (500 mg total) by mouth 2 (two) times daily.        Discharge Exam: Filed Weights   01/01/24 2206 01/02/24 1407  Weight: 99.8 kg 97.1 kg   General exam: Appears calm and comfortable  Respiratory system: Clear to auscultation. Respiratory effort normal. Cardiovascular system: S1 & S2 heard, RRR. Soft systolic murmur Gastrointestinal system: Abdomen is nondistended, soft and nontender.   Central nervous system: Alert and oriented. No focal neurological deficits. Extremities: Symmetric 5 x 5 power. Skin: No rashes, lesions or ulcers Psychiatry: Judgement and insight appear normal. Mood & affect appropriate.       Condition at discharge: good  The results of significant diagnostics from this hospitalization (including imaging, microbiology, ancillary and laboratory) are listed below for reference.   Imaging Studies: ECHOCARDIOGRAM COMPLETE Result Date: 01/03/2024    ECHOCARDIOGRAM REPORT   Patient Name:   Daniel Holden Weiss Date of Exam: 01/03/2024 Medical Rec #:  [id number]        Height:       66.0 in Accession #:    [id number]       Weight:       214.0 lb Date of Birth:  03-21-1946        BSA:          2.058 m Patient Age:    78 years         BP:           137/67 mmHg Patient Gender: M                HR:           33 bpm. Exam Location:  [facility] Procedure: 2D Echo, Cardiac Doppler and Color Doppler (Both Spectral and Color            Flow Doppler were utilized during procedure). Indications:     NSTEMI  History:         Patient has prior history of Echocardiogram examinations, most                  recent 01/11/2022. CAD, Arrythmias:Atrial Fibrillation,                  Signs/Symptoms:Hypotension; Risk Factors:Hypertension.  Sonographer:     Philomena Daring Referring Phys:  [id number] GJW A MANSY Diagnosing Phys: Redell Cave MD  Sonographer Comments: Patient is obese and Technically difficult study due to poor echo windows. Image acquisition challenging due to patient body habitus. IMPRESSIONS  1. Left ventricular ejection fraction, by estimation, is 60 to 65%. The  left ventricle has normal function. The left ventricle has no regional wall motion abnormalities. There is mild left ventricular hypertrophy. Left ventricular diastolic parameters are consistent with Grade I diastolic dysfunction (impaired relaxation).  2. Right ventricular systolic function is normal. The right ventricular size is normal.  3. The mitral valve is normal in structure. No evidence of mitral valve regurgitation.  4. The aortic valve was not well visualized. Aortic valve regurgitation is not visualized.  5. The inferior vena cava is normal in size with greater than 50% respiratory variability, suggesting right atrial pressure of 3 mmHg. FINDINGS  Left Ventricle: Left ventricular ejection fraction, by estimation, is 60 to 65%. The left ventricle has normal function. The left ventricle has no regional wall motion abnormalities. The left ventricular internal cavity size was normal in size. There is  mild left ventricular hypertrophy. Left ventricular diastolic parameters are consistent with Grade I diastolic dysfunction (impaired relaxation). Right Ventricle: The right ventricular size is normal. No increase in right ventricular wall thickness. Right ventricular systolic function is normal. Left Atrium: Left atrial size was normal in size. Right Atrium: Right atrial size was normal in size. Pericardium: There is no evidence of pericardial effusion. Mitral Valve: The mitral valve is normal in structure. No evidence of mitral valve regurgitation. Tricuspid Valve: The tricuspid valve is normal in structure. Tricuspid valve regurgitation is mild. Aortic Valve: The aortic valve was not well visualized. Aortic valve regurgitation is not visualized. Pulmonic  Valve: The pulmonic valve was not well visualized. Pulmonic valve regurgitation is not visualized. Aorta: The aortic root and ascending aorta are structurally normal, with no evidence of dilitation. Venous: The inferior vena cava is normal in size with greater than 50% respiratory variability, suggesting right atrial pressure of 3 mmHg. IAS/Shunts: No atrial level shunt detected by color flow Doppler.  LEFT VENTRICLE PLAX 2D LVIDd:         4.39 cm   Diastology LV PW:         1.24 cm   LV e' medial:    4.45 cm/s LV IVS:        1.33 cm   LV E/e' medial:  17.9 LVOT diam:     2.10 cm   LV e' lateral:   9.38 cm/s LV SV:         107       LV E/e' lateral: 8.5 LV SV Index:   52 LVOT Area:     3.46 cm  RIGHT VENTRICLE             IVC RV S prime:     12.20 cm/s  IVC diam: 2.14 cm TAPSE (M-mode): 1.7 cm LEFT ATRIUM             Index        RIGHT ATRIUM           Index LA Vol (A2C):   59.9 ml 29.10 ml/m  RA Area:     18.00 cm LA Vol (A4C):   54.4 ml 26.43 ml/m  RA Volume:   45.80 ml  22.25 ml/m LA Biplane Vol: 59.1 ml 28.72 ml/m  AORTIC VALVE LVOT Vmax:   139.00 cm/s LVOT Vmean:  80.400 cm/s LVOT VTI:    0.310 m  AORTA Ao Root diam: 2.70 cm Ao Asc diam:  3.00 cm MITRAL VALVE                TRICUSPID VALVE MV Area (PHT): 3.01 cm     TR  Peak grad:   6.8 mmHg MV Decel Time: 252 msec     TR Vmax:        130.00 cm/s MV E velocity: 79.50 cm/s MV A velocity: 100.00 cm/s  SHUNTS MV E/A ratio:  0.80         Systemic VTI:  0.31 m                             Systemic Diam: 2.10 cm Redell Cave MD Electronically signed by Redell Cave MD Signature Date/Time: 01/03/2024/9:50:03 AM    Final    CARDIAC CATHETERIZATION Result Date: 01/02/2024 Images from the original result were not included.   Severe multivessel CAD with known occlusion of the RCA and progression of proximal LAD/LCx stenosis to 95% LAD and 90% LCx.   Mid LM to Prox LAD lesion is 20% stenosed with 40% stenosed side branch in Ost Cx.   Prox LAD lesion is 95%  stenosed. Dist LAD lesion is 40% stenosed.   Ost Cx to Prox Cx lesion is 90% stenosed.  Mid Cx lesion is 50% stenosed.  Mid Cx to Dist Cx lesion is 60% stenosed with 50% stenosed side branch in 3rd Mrg.   Prox RCA to Dist RCA lesion is 100% stenosed with 100% stenosed side branch in RPAV.   --------------------------------------------------   LV end diastolic pressure is mildly elevated.  There is no aortic valve stenosis.   -------------------------------------------------- Dominance: Right: RECOMMENDATION   Multivessel CAD-recommend CABG x 3.  Patient be transferred to [facility] for CVTS consultation. Continue to titrate GDMT for CAD.  Anticipated discharge date to be determined, but okay for discharge.  on 01/02/2024.   Restart IV Heparin  for ACS/Afib --for coverage until CABG.  Recommend concurrent antiplatelet therapy of Aspirin  81 mg daily for 6 months. Alm Clay, MD  DG Chest 2 View Result Date: 01/01/2024 CLINICAL DATA:  Chest pain. EXAM: CHEST - 2 VIEW COMPARISON:  None Available. FINDINGS: The heart size and mediastinal contours are within normal limits. Both lungs are clear. The visualized skeletal structures are unremarkable. IMPRESSION: No active cardiopulmonary disease. Electronically Signed   By: Suzen Dials M.D.   On: 01/01/2024 22:42    Microbiology: Results for orders placed or performed during the hospital encounter of 03/19/21  MRSA Next Gen by PCR, Nasal     Status: None   Collection Time: 03/19/21  9:54 PM   Specimen: Nasopharyngeal Swab; Nasal Swab  Result Value Ref Range Status   MRSA by PCR Next Gen NOT DETECTED NOT DETECTED Final    Comment: (NOTE) The GeneXpert MRSA Assay (FDA approved for NASAL specimens only), is one component of a comprehensive MRSA colonization surveillance program. It is not intended to diagnose MRSA infection nor to guide or monitor treatment for MRSA infections. Test performance is not FDA approved in patients Daniel than 76  years old. Performed at [facility], [address] [place], [place] [address]   Resp Panel by RT-PCR (Flu A&B, Covid) Nasopharyngeal Swab     Status: None   Collection Time: 03/19/21 10:18 PM   Specimen: Nasopharyngeal Swab; Nasopharyngeal(NP) swabs in vial transport medium  Result Value Ref Range Status   SARS Coronavirus 2 by RT PCR NEGATIVE NEGATIVE Final    Comment: (NOTE) SARS-CoV-2 target nucleic acids are NOT DETECTED.  The SARS-CoV-2 RNA is generally detectable in upper respiratory specimens during the acute phase of infection. The lowest concentration of SARS-CoV-2  viral copies this assay can detect is 138 copies/mL. A negative result does not preclude SARS-Cov-2 infection and should not be used as the sole basis for treatment or other patient management decisions. A negative result may occur with  improper specimen collection/handling, submission of specimen other than nasopharyngeal swab, presence of viral mutation(s) within the areas targeted by this assay, and inadequate number of viral copies(<138 copies/mL). A negative result must be combined with clinical observations, patient history, and epidemiological information. The expected result is Negative.  Fact Sheet for Patients:  BloggerCourse.com  Fact Sheet for Healthcare Providers:  SeriousBroker.it  This test is no t yet approved or cleared by the [place]  FDA and  has been authorized for detection and/or diagnosis of SARS-CoV-2 by FDA under an Emergency Use Authorization (EUA). This EUA will remain  in effect (meaning this test can be used) for the duration of the COVID-19 declaration under Section 564(b)(1) of the Act, 21 U.S.C.section 360bbb-3(b)(1), unless the authorization is terminated  or revoked sooner.       Influenza A by PCR NEGATIVE NEGATIVE Final   Influenza B by PCR NEGATIVE NEGATIVE Final    Comment: (NOTE) The Xpert Xpress  SARS-CoV-2/FLU/RSV plus assay is intended as an aid in the diagnosis of influenza from Nasopharyngeal swab specimens and should not be used as a sole basis for treatment. Nasal washings and aspirates are unacceptable for Xpert Xpress SARS-CoV-2/FLU/RSV testing.  Fact Sheet for Patients: BloggerCourse.com  Fact Sheet for Healthcare Providers: SeriousBroker.it  This test is not yet approved or cleared by the [place]  FDA and has been authorized for detection and/or diagnosis of SARS-CoV-2 by FDA under an Emergency Use Authorization (EUA). This EUA will remain in effect (meaning this test can be used) for the duration of the COVID-19 declaration under Section 564(b)(1) of the Act, 21 U.S.C. section 360bbb-3(b)(1), unless the authorization is terminated or revoked.  Performed at [facility], [address] [place], [place] [address]   CULTURE, BLOOD (ROUTINE X 2) w Reflex to ID Panel     Status: None   Collection Time: 03/20/21 12:58 AM   Specimen: BLOOD  Result Value Ref Range Status   Specimen Description BLOOD LEFT ARM  Final   Special Requests   Final    BOTTLES DRAWN AEROBIC AND ANAEROBIC Blood Culture adequate volume   Culture   Final    NO GROWTH 5 DAYS Performed at [facility], [address] [place], [place] [address]    Report Status 03/25/2021 FINAL  Final  CULTURE, BLOOD (ROUTINE X 2) w Reflex to ID Panel     Status: None   Collection Time: 03/20/21 12:58 AM   Specimen: BLOOD  Result Value Ref Range Status   Specimen Description BLOOD LEFT HAND  Final   Special Requests   Final    BOTTLES DRAWN AEROBIC AND ANAEROBIC Blood Culture adequate volume   Culture   Final    NO GROWTH 5 DAYS Performed at [facility], [address], [place], [place] [address]    Report Status 03/25/2021 FINAL  Final  Aerobic/Anaerobic Culture w Gram Stain (surgical/deep wound)     Status: None    Collection Time: 03/20/21  8:14 AM   Specimen: PATH Sinus Contents/Nasal Polyps  Result Value Ref Range Status   Specimen Description   Final    MAXILLARY SINUS Performed at [facility], [address] [place], [place] [address]    Special Requests   Final  NONE Performed at [facility], [address] [place], [place] [address]    Gram Stain   Final    NO SQUAMOUS EPITHELIAL CELLS SEEN FEW WBC PRESENT, PREDOMINANTLY MONONUCLEAR FEW GRAM POSITIVE COCCI MODERATE HYPHAL ELEMENTS SEEN    Culture   Final    FEW STAPHYLOCOCCUS AUREUS NO ANAEROBES ISOLATED Performed at [facility], [address] [place], [place] [address]    Report Status 03/25/2021 FINAL  Final   Organism ID, Bacteria STAPHYLOCOCCUS AUREUS  Final      Susceptibility   Staphylococcus aureus - MIC*    CIPROFLOXACIN >=8 RESISTANT Resistant     ERYTHROMYCIN  RESISTANT Resistant     GENTAMICIN <=0.5 SENSITIVE Sensitive     OXACILLIN 0.5 SENSITIVE Sensitive     TETRACYCLINE <=1 SENSITIVE Sensitive     VANCOMYCIN  1 SENSITIVE Sensitive     TRIMETH/SULFA <=10 SENSITIVE Sensitive     CLINDAMYCIN RESISTANT Resistant     RIFAMPIN <=0.5 SENSITIVE Sensitive     Inducible Clindamycin POSITIVE Resistant     * FEW STAPHYLOCOCCUS AUREUS  Fungus Stain     Status: None   Collection Time: 03/20/21  8:14 AM   Specimen: PATH Sinus Contents/Nasal Polyps  Result Value Ref Range Status   FUNGUS STAIN Final report  Final    Comment: (NOTE) Performed At: [facility] [address] [place], [place] [id number] Jennette Shorter MD Ey:[id number]    Fungal Source MAXILLARY SINUS  Final    Comment: Performed at [facility], [address] [place], [place] [address]  Fungal Stain reflex     Status: None   Collection Time: 03/20/21  8:14 AM  Result Value Ref Range Status   Fungal stain result 1 Comment  Final    Comment: (NOTE) KOH/Calcofluor preparation:  no fungus  observed. Performed At: [facility] [address] [place], [place] [id number] Jennette Shorter MD Ey:[id number]   Fungus Culture Result     Status: None   Collection Time: 03/20/21  8:14 AM  Result Value Ref Range Status   Result 1 Comment  Final    Comment: (NOTE) KOH/Calcofluor preparation:  no fungus observed. Performed At: [facility] [address] [place], [place] [id number] Jennette Shorter MD Ey:[id number]   Fungal organism reflex     Status: None   Collection Time: 03/20/21  8:14 AM  Result Value Ref Range Status   Fungal result 1 Comment  Final    Comment: (NOTE) No yeast or mold isolated after 4 weeks. Performed At: [facility] [address] [place], [place] [id number] Jennette Shorter MD Ey:[id number]     Labs: CBC: Recent Labs  Lab 01/01/24 2213 01/02/24 0426 01/03/24 0458  WBC 6.5 6.1 5.3  HGB 12.1* 12.0* 11.5*  HCT 33.3* 34.0* 32.7*  MCV 104.7* 104.9* 105.8*  PLT 187 188 178   Basic Metabolic Panel: Recent Labs  Lab 01/01/24 2213 01/02/24 0426 01/03/24 0458  NA 128* 133* 136  K 3.5 3.7 3.6  CL 92* 96* 102  CO2 24 24 25   GLUCOSE 163* 141* 132*  BUN 15 13 10   CREATININE 0.73 0.76 0.48*  CALCIUM  9.1 9.4 8.9   Liver Function Tests: No results for input(s): AST, ALT, ALKPHOS, BILITOT, PROT, ALBUMIN  in the last 168 hours. CBG: Recent Labs  Lab 01/02/24 0722 01/02/24 1202 01/02/24 2023 01/03/24 0742 01/03/24 1127  GLUCAP 142* 136* 120* 144* 170*    Discharge time spent:  37 minutes.  Signed: Drue ONEIDA Potter,  MD [facility] 01/03/2024

## 2024-01-03 NOTE — Consult Note (Signed)
 Reason for Consult:3 vessel CAD s/p nonSTEMI Referring Physician: Dr. Anner- cath, Dr. Shlomo primary Cards   Daniel Holden is an 78 y.o. male.  HPI: c/o of CP  Daniel Holden is a 78 yo man with a known history of CAD and chronic stable angina.  History of hypertension, hyperlipidemia, sleep apnea, pancreatitis, reflux.  Presented with a 3 day history of accelerating CP.  Increased frequency and severity. Presented after prolonged episode on Wednesday lasting over 2 hours.  Also noted irregular HR.  Was found to be in new onset atrial fib with RVR.  Initial troponin 125, increased to  5240.   Underwent cath- CTO of RCA, 95% LAD and 90% circumflex stenoses.  Bifurcated LAD with proximal septal perforator branch and larger anterior apical branch.  Echo showed normal EF with no significant valvular pathology.  Currently pain free.  Past Medical History:  Diagnosis Date   Allergy    Angina of effort (HCC)    Anxiety    CAD (coronary artery disease), native coronary artery    coronary CTA showing aortic atherosclerosis with very high coronary Ca score at 4630 with severely calcified RCA and LAD > 70% and 50-69% prox to mid LCx.     Cancer (HCC)    Carpal tunnel syndrome    GERD (gastroesophageal reflux disease)    HLD (hyperlipidemia)    Hypertension    Lung nodule    Obesity    Pancreatitis 2015   Sleep apnea     Past Surgical History:  Procedure Laterality Date   CARPAL TUNNEL RELEASE     COLONOSCOPY WITH PROPOFOL  N/A 06/27/2021   Procedure: COLONOSCOPY WITH PROPOFOL ;  Surgeon: Maryruth Ole DASEN, MD;  Location: [facility];  Service: Endoscopy;  Laterality: N/A;   ESOPHAGOGASTRODUODENOSCOPY (EGD) WITH PROPOFOL  N/A 06/27/2021   Procedure: ESOPHAGOGASTRODUODENOSCOPY (EGD) WITH PROPOFOL ;  Surgeon: Maryruth Ole DASEN, MD;  Location: [facility];  Service: Endoscopy;  Laterality: N/A;   ETHMOIDECTOMY Right 03/20/2021   Procedure: ANTERIOR ETHMOIDECTOMY;  Surgeon: Blair Mt,  MD;  Location: [facility] ORS;  Service: ENT;  Laterality: Right;   IMAGE GUIDED SINUS SURGERY N/A 03/20/2021   Procedure: IMAGE GUIDED SINUS SURGERY;  Surgeon: Blair Mt, MD;  Location: [facility] ORS;  Service: ENT;  Laterality: N/A;   KNEE ARTHROSCOPY WITH MEDIAL MENISECTOMY Left 06/25/2019   Procedure: KNEE ARTHROSCOPY WITH MEDIAL MENISECTOMY;  Surgeon: Marchia Drivers, MD;  Location: [facility] ORS;  Service: Orthopedics;  Laterality: Left;   LEFT HEART CATH AND CORONARY ANGIOGRAPHY N/A 10/04/2020   Procedure: LEFT HEART CATH AND CORONARY ANGIOGRAPHY;  Surgeon: Anner Alm ORN, MD;  Location: [facility] INVASIVE CV LAB;  Service: Cardiovascular;  Laterality: N/A;   LEFT HEART CATH AND CORONARY ANGIOGRAPHY N/A 01/02/2024   Procedure: LEFT HEART CATH AND CORONARY ANGIOGRAPHY;  Surgeon: Anner Alm ORN, MD;  Location: [facility] INVASIVE CV LAB;  Service: Cardiovascular;  Laterality: N/A;   MAXILLARY ANTROSTOMY Right 03/20/2021   Procedure: MAXILLARY ANTROSTOMY WITH TISSUE REMOVAL;  Surgeon: Blair Mt, MD;  Location: [facility] ORS;  Service: ENT;  Laterality: Right;   SHOULDER SURGERY     TONSILLECTOMY      Family History  Problem Relation Age of Onset   CVA Father     Social History:  reports that he has never smoked. He has never used smokeless tobacco. He reports current alcohol use of about 42.0 standard drinks of alcohol per week. He reports that he does not use drugs.  Allergies:  Allergies  Allergen Reactions  Metronidazole  Other (See Comments)    Patient stated that he had a moderate level of pain with his allergic reaction.    Ace Inhibitors Cough   Dilaudid  [Hydromorphone  Hcl] Nausea And Vomiting    Medications: Scheduled:  aspirin  EC  81 mg Oral Daily   aspirin  EC  81 mg Oral Daily   atorvastatin   80 mg Oral Daily   furosemide   20 mg Oral Daily   isosorbide  mononitrate  30 mg Oral Daily   losartan   50 mg Oral Daily   metoprolol  tartrate  50 mg Oral BID   ranolazine   500 mg Oral BID    sodium chloride  flush  3 mL Intravenous Q12H    Results for orders placed or performed during the hospital encounter of 01/01/24 (from the past 48 hours)  Basic metabolic panel     Status: Abnormal   Collection Time: 01/01/24 10:13 PM  Result Value Ref Range   Sodium 128 (L) 135 - 145 mmol/L   Potassium 3.5 3.5 - 5.1 mmol/L   Chloride 92 (L) 98 - 111 mmol/L   CO2 24 22 - 32 mmol/L   Glucose, Bld 163 (H) 70 - 99 mg/dL    Comment: Glucose reference range applies only to samples taken after fasting for at least 8 hours.   BUN 15 8 - 23 mg/dL   Creatinine, Ser 9.26 0.61 - 1.24 mg/dL   Calcium  9.1 8.9 - 10.3 mg/dL   GFR, Estimated >39 >39 mL/min    Comment: (NOTE) Calculated using the CKD-EPI Creatinine Equation (2021)    Anion gap 12 5 - 15    Comment: Performed at [facility], [address] [place], [place] [address]  CBC     Status: Abnormal   Collection Time: 01/01/24 10:13 PM  Result Value Ref Range   WBC 6.5 4.0 - 10.5 K/uL   RBC 3.18 (L) 4.22 - 5.81 MIL/uL   Hemoglobin 12.1 (L) 13.0 - 17.0 g/dL   HCT 66.6 (L) 60.9 - 47.9 %   MCV 104.7 (H) 80.0 - 100.0 fL   MCH 38.1 (H) 26.0 - 34.0 pg   MCHC 36.3 (H) 30.0 - 36.0 g/dL   RDW 88.3 88.4 - 84.4 %   Platelets 187 150 - 400 K/uL   nRBC 0.0 0.0 - 0.2 %    Comment: Performed at [facility], [address] [place], [place] [address]  Troponin I (High Sensitivity)     Status: Abnormal   Collection Time: 01/01/24 10:13 PM  Result Value Ref Range   Troponin I (High Sensitivity) 125 (HH) <18 ng/L    Comment: CRITICAL RESULT CALLED TO, READ BACK BY AND VERIFIED WITH Lang Ada RN @2250  on 01/01/24 SKL (NOTE) Elevated high sensitivity troponin I (hsTnI) values and significant  changes across serial measurements may suggest ACS but many other  chronic and acute conditions are known to elevate hsTnI results.  Refer to the Links section for chest pain algorithms and additional  guidance. Performed at [facility], [address] [place], [place] [address]   APTT     Status: None   Collection Time: 01/02/24 12:04 AM  Result Value Ref Range   aPTT 28 24 - 36 seconds    Comment: Performed at [facility], [address] [place], [place] [address]  Protime-INR     Status: None   Collection Time: 01/02/24 12:04 AM  Result Value Ref Range   Prothrombin Time 14.4 11.4 - 15.2  seconds   INR 1.1 0.8 - 1.2    Comment: (NOTE) INR goal varies based on device and disease states. Performed at [facility], [address] [place], [place] [address]   Troponin I (High Sensitivity)     Status: Abnormal   Collection Time: 01/02/24 12:04 AM  Result Value Ref Range   Troponin I (High Sensitivity) 1,199 (HH) <18 ng/L    Comment: CRITICAL VALUE NOTED. VALUE IS CONSISTENT WITH PREVIOUSLY REPORTED/CALLED VALUE SKL (NOTE) Elevated high sensitivity troponin I (hsTnI) values and significant  changes across serial measurements may suggest ACS but many other  chronic and acute conditions are known to elevate hsTnI results.  Refer to the Links section for chest pain algorithms and additional  guidance. Performed at [facility], [address] [place], [place] [address]   Basic metabolic panel     Status: Abnormal   Collection Time: 01/02/24  4:26 AM  Result Value Ref Range   Sodium 133 (L) 135 - 145 mmol/L   Potassium 3.7 3.5 - 5.1 mmol/L   Chloride 96 (L) 98 - 111 mmol/L   CO2 24 22 - 32 mmol/L   Glucose, Bld 141 (H) 70 - 99 mg/dL    Comment: Glucose reference range applies only to samples taken after fasting for at least 8 hours.   BUN 13 8 - 23 mg/dL   Creatinine, Ser 9.23 0.61 - 1.24 mg/dL   Calcium  9.4 8.9 - 10.3 mg/dL   GFR, Estimated >39 >39 mL/min    Comment: (NOTE) Calculated using the CKD-EPI Creatinine Equation (2021)    Anion gap 13 5 - 15    Comment: Performed at [facility], [address] [place], [place] [address]  Lipid panel      Status: None   Collection Time: 01/02/24  4:26 AM  Result Value Ref Range   Cholesterol 119 0 - 200 mg/dL   Triglycerides 867 <849 mg/dL   HDL 60 >59 mg/dL   Total CHOL/HDL Ratio 2.0 RATIO   VLDL 26 0 - 40 mg/dL   LDL Cholesterol 33 0 - 99 mg/dL    Comment:        Total Cholesterol/HDL:CHD Risk Coronary Heart Disease Risk Table                     Men   Women  1/2 Average Risk   3.4   3.3  Average Risk       5.0   4.4  2 X Average Risk   9.6   7.1  3 X Average Risk  23.4   11.0        Use the calculated Patient Ratio above and the CHD Risk Table to determine the patient's CHD Risk.        ATP III CLASSIFICATION (LDL):  <100     mg/dL   Optimal  899-870  mg/dL   Near or Above                    Optimal  130-159  mg/dL   Borderline  839-810  mg/dL   High  >809     mg/dL   Very High Performed at [facility], [address] [place], [place] [address]   CBC     Status: Abnormal   Collection Time: 01/02/24  4:26 AM  Result Value Ref Range   WBC 6.1 4.0 - 10.5 K/uL   RBC 3.24 (L) 4.22 - 5.81  MIL/uL   Hemoglobin 12.0 (L) 13.0 - 17.0 g/dL   HCT 65.9 (L) 60.9 - 47.9 %   MCV 104.9 (H) 80.0 - 100.0 fL   MCH 37.0 (H) 26.0 - 34.0 pg   MCHC 35.3 30.0 - 36.0 g/dL   RDW 88.1 88.4 - 84.4 %   Platelets 188 150 - 400 K/uL   nRBC 0.0 0.0 - 0.2 %    Comment: Performed at [facility], [address] [place], [place] [address]  Protime-INR     Status: None   Collection Time: 01/02/24  4:26 AM  Result Value Ref Range   Prothrombin Time 14.6 11.4 - 15.2 seconds   INR 1.1 0.8 - 1.2    Comment: (NOTE) INR goal varies based on device and disease states. Performed at [facility], [address] [place], [place] [address]   Hemoglobin A1c     Status: Abnormal   Collection Time: 01/02/24  4:26 AM  Result Value Ref Range   Hgb A1c MFr Bld 6.1 (H) 4.8 - 5.6 %    Comment: (NOTE)         Prediabetes: 5.7 - 6.4         Diabetes: >6.4         Glycemic  control for adults with diabetes: <7.0    Mean Plasma Glucose 128 mg/dL    Comment: (NOTE) Performed At: [facility] [address] [place], [place] [id number] Jennette Shorter MD Ey:[id number]   CBG monitoring, ED     Status: Abnormal   Collection Time: 01/02/24  7:22 AM  Result Value Ref Range   Glucose-Capillary 142 (H) 70 - 99 mg/dL    Comment: Glucose reference range applies only to samples taken after fasting for at least 8 hours.  Heparin  level (unfractionated)     Status: Abnormal   Collection Time: 01/02/24  7:34 AM  Result Value Ref Range   Heparin  Unfractionated 0.28 (L) 0.30 - 0.70 IU/mL    Comment: (NOTE) The clinical reportable range upper limit is being lowered to >1.10 to align with the FDA approved guidance for the current laboratory assay.  If heparin  results are below expected values, and patient dosage has  been confirmed, suggest follow up testing of antithrombin III levels. Performed at [facility], [address] [place], [place] [address]   Troponin I (High Sensitivity)     Status: Abnormal   Collection Time: 01/02/24  8:25 AM  Result Value Ref Range   Troponin I (High Sensitivity) 5,240 (HH) <18 ng/L    Comment: CRITICAL VALUE NOTED. VALUE IS CONSISTENT WITH PREVIOUSLY REPORTED/CALLED VALUE HNM (NOTE) Elevated high sensitivity troponin I (hsTnI) values and significant  changes across serial measurements may suggest ACS but many other  chronic and acute conditions are known to elevate hsTnI results.  Refer to the Links section for chest pain algorithms and additional  guidance. Performed at [facility], [address] [place], [place] [address]   CBG monitoring, ED     Status: Abnormal   Collection Time: 01/02/24 12:02 PM  Result Value Ref Range   Glucose-Capillary 136 (H) 70 - 99 mg/dL    Comment: Glucose reference range applies only to samples taken after fasting for at least 8 hours.  Glucose, capillary      Status: Abnormal   Collection Time: 01/02/24  8:23 PM  Result Value Ref Range   Glucose-Capillary 120 (H) 70 - 99 mg/dL    Comment: Glucose reference range  applies only to samples taken after fasting for at least 8 hours.  Basic metabolic panel with GFR     Status: Abnormal   Collection Time: 01/03/24  4:58 AM  Result Value Ref Range   Sodium 136 135 - 145 mmol/L   Potassium 3.6 3.5 - 5.1 mmol/L   Chloride 102 98 - 111 mmol/L   CO2 25 22 - 32 mmol/L   Glucose, Bld 132 (H) 70 - 99 mg/dL    Comment: Glucose reference range applies only to samples taken after fasting for at least 8 hours.   BUN 10 8 - 23 mg/dL   Creatinine, Ser 9.51 (L) 0.61 - 1.24 mg/dL   Calcium  8.9 8.9 - 10.3 mg/dL   GFR, Estimated >39 >39 mL/min    Comment: (NOTE) Calculated using the CKD-EPI Creatinine Equation (2021)    Anion gap 9 5 - 15    Comment: Performed at [facility], [address] [place], [place] [address]  CBC     Status: Abnormal   Collection Time: 01/03/24  4:58 AM  Result Value Ref Range   WBC 5.3 4.0 - 10.5 K/uL   RBC 3.09 (L) 4.22 - 5.81 MIL/uL   Hemoglobin 11.5 (L) 13.0 - 17.0 g/dL   HCT 67.2 (L) 60.9 - 47.9 %   MCV 105.8 (H) 80.0 - 100.0 fL   MCH 37.2 (H) 26.0 - 34.0 pg   MCHC 35.2 30.0 - 36.0 g/dL   RDW 87.8 88.4 - 84.4 %   Platelets 178 150 - 400 K/uL   nRBC 0.0 0.0 - 0.2 %    Comment: Performed at [facility], [address] [place], [place] [address]  Heparin  level (unfractionated)     Status: None   Collection Time: 01/03/24  4:58 AM  Result Value Ref Range   Heparin  Unfractionated 0.40 0.30 - 0.70 IU/mL    Comment: (NOTE) The clinical reportable range upper limit is being lowered to >1.10 to align with the FDA approved guidance for the current laboratory assay.  If heparin  results are below expected values, and patient dosage has  been confirmed, suggest follow up testing of antithrombin III levels. Performed at [facility], [address] [place], [place] [address]   Glucose, capillary     Status: Abnormal   Collection Time: 01/03/24  7:42 AM  Result Value Ref Range   Glucose-Capillary 144 (H) 70 - 99 mg/dL    Comment: Glucose reference range applies only to samples taken after fasting for at least 8 hours.  Glucose, capillary     Status: Abnormal   Collection Time: 01/03/24 11:27 AM  Result Value Ref Range   Glucose-Capillary 170 (H) 70 - 99 mg/dL    Comment: Glucose reference range applies only to samples taken after fasting for at least 8 hours.  Heparin  level (unfractionated)     Status: None   Collection Time: 01/03/24  1:01 PM  Result Value Ref Range   Heparin  Unfractionated 0.35 0.30 - 0.70 IU/mL    Comment: (NOTE) The clinical reportable range upper limit is being lowered to >1.10 to align with the FDA approved guidance for the current laboratory assay.  If heparin  results are below expected values, and patient dosage has  been confirmed, suggest follow up testing of antithrombin III levels. Performed at [facility], [address] [place], [place] [address]     ECHOCARDIOGRAM COMPLETE Result Date: 01/03/2024    ECHOCARDIOGRAM REPORT   Patient Name:  Demarrion E Lubitz Date of Exam: 01/03/2024 Medical Rec #:  [id number]        Height:       66.0 in Accession #:    [id number]       Weight:       214.0 lb Date of Birth:  [birth year]        BSA:          2.058 m Patient Age:    77 years         BP:           137/67 mmHg Patient Gender: M                HR:           33 bpm. Exam Location:  [facility] Procedure: 2D Echo, Cardiac Doppler and Color Doppler (Both Spectral and Color            Flow Doppler were utilized during procedure). Indications:     NSTEMI  History:         Patient has prior history of Echocardiogram examinations, most                  recent 01/11/2022. CAD, Arrythmias:Atrial Fibrillation,                  Signs/Symptoms:Hypotension; Risk Factors:Hypertension.  Sonographer:     Philomena Daring  Referring Phys:  [id number] GJW A MANSY Diagnosing Phys: Redell Cave MD  Sonographer Comments: Patient is obese and Technically difficult study due to poor echo windows. Image acquisition challenging due to patient body habitus. IMPRESSIONS  1. Left ventricular ejection fraction, by estimation, is 60 to 65%. The left ventricle has normal function. The left ventricle has no regional wall motion abnormalities. There is mild left ventricular hypertrophy. Left ventricular diastolic parameters are consistent with Grade I diastolic dysfunction (impaired relaxation).  2. Right ventricular systolic function is normal. The right ventricular size is normal.  3. The mitral valve is normal in structure. No evidence of mitral valve regurgitation.  4. The aortic valve was not well visualized. Aortic valve regurgitation is not visualized.  5. The inferior vena cava is normal in size with greater than 50% respiratory variability, suggesting right atrial pressure of 3 mmHg. FINDINGS  Left Ventricle: Left ventricular ejection fraction, by estimation, is 60 to 65%. The left ventricle has normal function. The left ventricle has no regional wall motion abnormalities. The left ventricular internal cavity size was normal in size. There is  mild left ventricular hypertrophy. Left ventricular diastolic parameters are consistent with Grade I diastolic dysfunction (impaired relaxation). Right Ventricle: The right ventricular size is normal. No increase in right ventricular wall thickness. Right ventricular systolic function is normal. Left Atrium: Left atrial size was normal in size. Right Atrium: Right atrial size was normal in size. Pericardium: There is no evidence of pericardial effusion. Mitral Valve: The mitral valve is normal in structure. No evidence of mitral valve regurgitation. Tricuspid Valve: The tricuspid valve is normal in structure. Tricuspid valve regurgitation is mild. Aortic Valve: The aortic valve was not well  visualized. Aortic valve regurgitation is not visualized. Pulmonic Valve: The pulmonic valve was not well visualized. Pulmonic valve regurgitation is not visualized. Aorta: The aortic root and ascending aorta are structurally normal, with no evidence of dilitation. Venous: The inferior vena cava is normal in size with greater than 50% respiratory variability, suggesting right atrial pressure of 3 mmHg. IAS/Shunts: No atrial level shunt detected by color  flow Doppler.  LEFT VENTRICLE PLAX 2D LVIDd:         4.39 cm   Diastology LV PW:         1.24 cm   LV e' medial:    4.45 cm/s LV IVS:        1.33 cm   LV E/e' medial:  17.9 LVOT diam:     2.10 cm   LV e' lateral:   9.38 cm/s LV SV:         107       LV E/e' lateral: 8.5 LV SV Index:   52 LVOT Area:     3.46 cm  RIGHT VENTRICLE             IVC RV S prime:     12.20 cm/s  IVC diam: 2.14 cm TAPSE (M-mode): 1.7 cm LEFT ATRIUM             Index        RIGHT ATRIUM           Index LA Vol (A2C):   59.9 ml 29.10 ml/m  RA Area:     18.00 cm LA Vol (A4C):   54.4 ml 26.43 ml/m  RA Volume:   45.80 ml  22.25 ml/m LA Biplane Vol: 59.1 ml 28.72 ml/m  AORTIC VALVE LVOT Vmax:   139.00 cm/s LVOT Vmean:  80.400 cm/s LVOT VTI:    0.310 m  AORTA Ao Root diam: 2.70 cm Ao Asc diam:  3.00 cm MITRAL VALVE                TRICUSPID VALVE MV Area (PHT): 3.01 cm     TR Peak grad:   6.8 mmHg MV Decel Time: 252 msec     TR Vmax:        130.00 cm/s MV E velocity: 79.50 cm/s MV A velocity: 100.00 cm/s  SHUNTS MV E/A ratio:  0.80         Systemic VTI:  0.31 m                             Systemic Diam: 2.10 cm Redell Cave MD Electronically signed by Redell Cave MD Signature Date/Time: 01/03/2024/9:50:03 AM    Final    CARDIAC CATHETERIZATION Result Date: 01/02/2024 Images from the original result were not included.   Severe multivessel CAD with known occlusion of the RCA and progression of proximal LAD/LCx stenosis to 95% LAD and 90% LCx.   Mid LM to Prox LAD lesion is 20%  stenosed with 40% stenosed side branch in Ost Cx.   Prox LAD lesion is 95% stenosed. Dist LAD lesion is 40% stenosed.   Ost Cx to Prox Cx lesion is 90% stenosed.  Mid Cx lesion is 50% stenosed.  Mid Cx to Dist Cx lesion is 60% stenosed with 50% stenosed side branch in 3rd Mrg.   Prox RCA to Dist RCA lesion is 100% stenosed with 100% stenosed side branch in RPAV.   --------------------------------------------------   LV end diastolic pressure is mildly elevated.  There is no aortic valve stenosis.   -------------------------------------------------- Dominance: Right: RECOMMENDATION   Multivessel CAD-recommend CABG x 3.  Patient be transferred to [facility] for CVTS consultation. Continue to titrate GDMT for CAD.  Anticipated discharge date to be determined, but okay for discharge.  on 01/02/2024.   Restart IV Heparin  for ACS/Afib --for coverage until CABG.  Recommend concurrent antiplatelet therapy  of Aspirin  81 mg daily for 6 months. Alm Clay, MD  DG Chest 2 View Result Date: 01/01/2024 CLINICAL DATA:  Chest pain. EXAM: CHEST - 2 VIEW COMPARISON:  None Available. FINDINGS: The heart size and mediastinal contours are within normal limits. Both lungs are clear. The visualized skeletal structures are unremarkable. IMPRESSION: No active cardiopulmonary disease. Electronically Signed   By: Suzen Dials M.D.   On: 01/01/2024 22:42   I personally reviewed the cath images.  Severe 3 vessel CAD  Review of Systems  Constitutional:  Negative for activity change and unexpected weight change.  HENT:  Negative for trouble swallowing and voice change.   Eyes:  Negative for visual disturbance.  Respiratory:  Positive for shortness of breath.   Cardiovascular:  Positive for chest pain and palpitations.  Gastrointestinal:  Positive for abdominal pain (reflux).  Neurological:  Negative for syncope and weakness.   Blood pressure 116/66, pulse 62, temperature 98 F (36.7 C), temperature source Oral,  resp. rate 16, SpO2 96%. Physical Exam Vitals reviewed.  Constitutional:      General: He is not in acute distress.    Appearance: Normal appearance.  HENT:     Head: Normocephalic and atraumatic.  Eyes:     General: No scleral icterus.    Extraocular Movements: Extraocular movements intact.  Neck:     Vascular: No carotid bruit.  Cardiovascular:     Rate and Rhythm: Normal rate and regular rhythm.     Heart sounds: Normal heart sounds. No murmur heard.    No friction rub. No gallop.     Comments: Normal Allen's test on left Pulmonary:     Effort: Pulmonary effort is normal. No respiratory distress.     Breath sounds: Normal breath sounds. No wheezing.  Abdominal:     General: There is no distension.     Palpations: Abdomen is soft.  Musculoskeletal:     Cervical back: Neck supple.  Skin:    General: Skin is warm and dry.  Neurological:     General: No focal deficit present.     Mental Status: He is alert and oriented to person, place, and time.     Cranial Nerves: No cranial nerve deficit.     Motor: No weakness.     Assessment/Plan: 78 yo man with multiple CRF and known CAD with long standing chronic stable angina presents with 3 day history of accelerating angina and r/I for non-STEMI.  At cath has severe 3 vessel CAD.  Also in new onset atrial fibrillation at presentation.  CABG indicated for survival benefit and relief of symptoms.  I also offered him the option of PVI and LAA clip at time of CABG.  I discussed the general nature of the procedure, including the need for general anesthesia, the incisions to be used, the use of cardiopulmonary bypass, and the use of temporary pacemaker wires and drainage tubes postoperatively with Mr. Chisolm.  We discussed the expected hospital stay, overall recovery and short and long term outcomes. I informed him of the indications, risks, benefits and alternatives.   He understands the risks include, but are not limited to death,  stroke, MI, DVT/PE, bleeding, possible need for transfusion, infections, cardiac arrhythmias, as well as other organ system dysfunction including respiratory, renal, or GI complications.    He accepts the risks and agrees to proceed.  Plan OR Monday 7/14  Elspeth JAYSON Millers 01/03/2024, 5:15 PM

## 2024-01-03 NOTE — Care Management Important Message (Signed)
 Important Message  Patient Details  Name: Daniel Holden MRN: [id number] Date of Birth: 10/18/1945   Important Message Given:  Yes - Medicare IM     Daniel Holden 01/03/2024, 1:36 PM

## 2024-01-04 ENCOUNTER — Other Ambulatory Visit (HOSPITAL_COMMUNITY)

## 2024-01-04 DIAGNOSIS — I200 Unstable angina: Secondary | ICD-10-CM | POA: Diagnosis not present

## 2024-01-04 LAB — CBC
HCT: 30.2 % — ABNORMAL LOW (ref 39.0–52.0)
Hemoglobin: 10.8 g/dL — ABNORMAL LOW (ref 13.0–17.0)
MCH: 38.2 pg — ABNORMAL HIGH (ref 26.0–34.0)
MCHC: 35.8 g/dL (ref 30.0–36.0)
MCV: 106.7 fL — ABNORMAL HIGH (ref 80.0–100.0)
Platelets: 157 K/uL (ref 150–400)
RBC: 2.83 MIL/uL — ABNORMAL LOW (ref 4.22–5.81)
RDW: 12 % (ref 11.5–15.5)
WBC: 5.9 K/uL (ref 4.0–10.5)
nRBC: 0 % (ref 0.0–0.2)

## 2024-01-04 LAB — GLUCOSE, CAPILLARY
Glucose-Capillary: 150 mg/dL — ABNORMAL HIGH (ref 70–99)
Glucose-Capillary: 130 mg/dL — ABNORMAL HIGH (ref 70–99)
Glucose-Capillary: 126 mg/dL — ABNORMAL HIGH (ref 70–99)

## 2024-01-04 LAB — ABO/RH: ABO/RH(D): O POS

## 2024-01-04 LAB — HEPARIN LEVEL (UNFRACTIONATED): Heparin Unfractionated: 0.6 [IU]/mL (ref 0.30–0.70)

## 2024-01-04 MED ORDER — TRANEXAMIC ACID (OHS) BOLUS VIA INFUSION
15.0000 mg/kg | INTRAVENOUS | Status: AC
  Administered 2024-01-06: 1456.5 mg via INTRAVENOUS
  Filled 2024-01-04: qty 1457

## 2024-01-04 MED ORDER — NITROGLYCERIN IN D5W 200-5 MCG/ML-% IV SOLN
2.0000 ug/min | INTRAVENOUS | Status: AC
  Administered 2024-01-06: 5 ug/min via INTRAVENOUS
  Filled 2024-01-04: qty 250

## 2024-01-04 MED ORDER — VANCOMYCIN HCL 1500 MG/300ML IV SOLN
1500.0000 mg | INTRAVENOUS | Status: AC
  Administered 2024-01-06: 1500 mg via INTRAVENOUS
  Filled 2024-01-04: qty 300

## 2024-01-04 MED ORDER — MILRINONE LACTATE IN DEXTROSE 20-5 MG/100ML-% IV SOLN
0.3000 ug/kg/min | INTRAVENOUS | Status: DC
  Filled 2024-01-04: qty 100

## 2024-01-04 MED ORDER — NOREPINEPHRINE 4 MG/250ML-% IV SOLN
0.0000 ug/min | INTRAVENOUS | Status: DC
  Filled 2024-01-04: qty 250

## 2024-01-04 MED ORDER — HEPARIN 30,000 UNITS/1000 ML (OHS) CELLSAVER SOLUTION
Status: DC
  Filled 2024-01-04: qty 1000

## 2024-01-04 MED ORDER — METOPROLOL TARTRATE 12.5 MG HALF TABLET
12.5000 mg | ORAL_TABLET | Freq: Once | ORAL | Status: AC
  Administered 2024-01-06: 12.5 mg via ORAL
  Filled 2024-01-04: qty 1

## 2024-01-04 MED ORDER — EPINEPHRINE HCL 5 MG/250ML IV SOLN IN NS
0.0000 ug/min | INTRAVENOUS | Status: DC
  Filled 2024-01-04: qty 250

## 2024-01-04 MED ORDER — TRANEXAMIC ACID (OHS) PUMP PRIME SOLUTION
2.0000 mg/kg | INTRAVENOUS | Status: DC
  Filled 2024-01-04: qty 1.94

## 2024-01-04 MED ORDER — MAGNESIUM SULFATE 50 % IJ SOLN
40.0000 meq | INTRAMUSCULAR | Status: DC
  Filled 2024-01-04: qty 9.85

## 2024-01-04 MED ORDER — POTASSIUM CHLORIDE 2 MEQ/ML IV SOLN
80.0000 meq | INTRAVENOUS | Status: DC
  Filled 2024-01-04: qty 40

## 2024-01-04 MED ORDER — CEFAZOLIN SODIUM-DEXTROSE 2-4 GM/100ML-% IV SOLN
2.0000 g | INTRAVENOUS | Status: AC
  Administered 2024-01-06 (×2): 2 g via INTRAVENOUS
  Filled 2024-01-04: qty 100

## 2024-01-04 MED ORDER — CHLORHEXIDINE GLUCONATE 0.12 % MT SOLN
15.0000 mL | Freq: Once | OROMUCOSAL | Status: AC
  Administered 2024-01-06: 15 mL via OROMUCOSAL
  Filled 2024-01-04: qty 15

## 2024-01-04 MED ORDER — CEFAZOLIN SODIUM-DEXTROSE 2-4 GM/100ML-% IV SOLN
2.0000 g | INTRAVENOUS | Status: DC
  Filled 2024-01-04: qty 100

## 2024-01-04 MED ORDER — PLASMA-LYTE A IV SOLN
INTRAVENOUS | Status: DC
  Filled 2024-01-04: qty 2.5

## 2024-01-04 MED ORDER — CHLORHEXIDINE GLUCONATE CLOTH 2 % EX PADS: 6.0000 | MEDICATED_PAD | Freq: Once | CUTANEOUS | Status: DC

## 2024-01-04 MED ORDER — PHENYLEPHRINE HCL-NACL 20-0.9 MG/250ML-% IV SOLN
30.0000 ug/min | INTRAVENOUS | Status: AC
  Administered 2024-01-06: 25 ug/min via INTRAVENOUS
  Filled 2024-01-04: qty 250

## 2024-01-04 MED ORDER — INSULIN REGULAR(HUMAN) IN NACL 100-0.9 UT/100ML-% IV SOLN
INTRAVENOUS | Status: AC
  Administered 2024-01-06: 3 [IU]/h via INTRAVENOUS
  Filled 2024-01-04: qty 100

## 2024-01-04 MED ORDER — TRANEXAMIC ACID 1000 MG/10ML IV SOLN
1.5000 mg/kg/h | INTRAVENOUS | Status: AC
  Administered 2024-01-06: 1.5 mg/kg/h via INTRAVENOUS
  Filled 2024-01-04: qty 25

## 2024-01-04 MED ORDER — BISACODYL 5 MG PO TBEC
5.0000 mg | DELAYED_RELEASE_TABLET | Freq: Once | ORAL | Status: AC
  Administered 2024-01-05: 5 mg via ORAL
  Filled 2024-01-04: qty 1

## 2024-01-04 MED ORDER — CHLORHEXIDINE GLUCONATE CLOTH 2 % EX PADS
6.0000 | MEDICATED_PAD | Freq: Once | CUTANEOUS | Status: AC
  Administered 2024-01-05: 6 via TOPICAL

## 2024-01-04 MED ORDER — DEXMEDETOMIDINE HCL IN NACL 400 MCG/100ML IV SOLN
0.1000 ug/kg/h | INTRAVENOUS | Status: AC
  Administered 2024-01-06: .7 ug/kg/h via INTRAVENOUS
  Filled 2024-01-04: qty 100

## 2024-01-04 NOTE — Plan of Care (Signed)
  Problem: Education: Goal: Knowledge of General Education information will improve Description: Including pain rating scale, medication(s)/side effects and non-pharmacologic comfort measures Outcome: Progressing   Problem: Health Behavior/Discharge Planning: Goal: Ability to manage health-related needs will improve Outcome: Progressing   Problem: Clinical Measurements: Goal: Ability to maintain clinical measurements within normal limits will improve Outcome: Progressing Goal: Will remain free from infection Outcome: Progressing Goal: Respiratory complications will improve Outcome: Not Applicable Goal: Cardiovascular complication will be avoided Outcome: Progressing   Problem: Activity: Goal: Risk for activity intolerance will decrease Outcome: Progressing   Problem: Nutrition: Goal: Adequate nutrition will be maintained Outcome: Progressing   Problem: Coping: Goal: Level of anxiety will decrease Outcome: Progressing   Problem: Elimination: Goal: Will not experience complications related to bowel motility Outcome: Progressing Goal: Will not experience complications related to urinary retention Outcome: Progressing   Problem: Pain Managment: Goal: General experience of comfort will improve and/or be controlled Outcome: Progressing   Problem: Safety: Goal: Ability to remain free from injury will improve Outcome: Progressing   Problem: Skin Integrity: Goal: Risk for impaired skin integrity will decrease Outcome: Progressing

## 2024-01-04 NOTE — Progress Notes (Signed)
 Progress Note  Patient Name: Daniel Holden Date of Encounter: 01/04/2024  Primary Cardiologist: Wilbert Bihari, MD   Subjective   Feels great -- no chest pain since AF resolved.  Inpatient Medications    Scheduled Meds:  aspirin  EC  81 mg Oral Daily   atorvastatin   80 mg Oral Daily   [START ON 01/05/2024] bisacodyl   5 mg Oral Once   [START ON 01/06/2024] chlorhexidine   15 mL Mouth/Throat Once   [START ON 01/05/2024] Chlorhexidine  Gluconate Cloth  6 each Topical Once   And   [START ON 01/06/2024] Chlorhexidine  Gluconate Cloth  6 each Topical Once   furosemide   20 mg Oral Daily   isosorbide  mononitrate  30 mg Oral Daily   losartan   50 mg Oral Daily   [START ON 01/06/2024] metoprolol  tartrate  12.5 mg Oral Once   metoprolol  tartrate  50 mg Oral BID   ranolazine   500 mg Oral BID   sodium chloride  flush  3 mL Intravenous Q12H   Continuous Infusions:  sodium chloride      heparin      PRN Meds: sodium chloride , ALPRAZolam , sodium chloride  flush   Vital Signs    Vitals:   01/03/24 2118 01/03/24 2255 01/04/24 0550 01/04/24 0757  BP: (!) 106/59 131/71 (!) 144/77 130/71  Pulse: 68 (!) 59 (!) 54 62  Resp:  18 20 17   Temp:  98.3 F (36.8 C) 98 F (36.7 C) 98 F (36.7 C)  TempSrc:  Oral Oral Oral  SpO2:  96% 95% 98%  Weight:      Height:        Intake/Output Summary (Last 24 hours) at 01/04/2024 9077 Last data filed at 01/03/2024 2251 Gross per 24 hour  Intake 363 ml  Output --  Net 363 ml   Filed Weights   01/03/24 1615  Weight: 97.1 kg    Telemetry    Sinus rhythm - Personally Reviewed  ECG    7/9: AF with RVR - Personally Reviewed  Physical Exam   GEN: No acute distress.   Neck: No JVD Cardiac: RRR, no murmurs, rubs, or gallops.  Respiratory: Clear to auscultation bilaterally. GI: Soft, nontender, non-distended  MS: No edema; No deformity. Neuro:  Nonfocal  Psych: Normal affect   Labs    Chemistry Recent Labs  Lab 01/01/24 2213 01/02/24 0426  01/03/24 0458  NA 128* 133* 136  K 3.5 3.7 3.6  CL 92* 96* 102  CO2 24 24 25   GLUCOSE 163* 141* 132*  BUN 15 13 10   CREATININE 0.73 0.76 0.48*  CALCIUM  9.1 9.4 8.9  GFRNONAA >60 >60 >60  ANIONGAP 12 13 9      Hematology Recent Labs  Lab 01/02/24 0426 01/03/24 0458 01/04/24 0446  WBC 6.1 5.3 5.9  RBC 3.24* 3.09* 2.83*  HGB 12.0* 11.5* 10.8*  HCT 34.0* 32.7* 30.2*  MCV 104.9* 105.8* 106.7*  MCH 37.0* 37.2* 38.2*  MCHC 35.3 35.2 35.8  RDW 11.8 12.1 12.0  PLT 188 178 157    Cardiac EnzymesNo results for input(s): TROPONINI in the last 168 hours. No results for input(s): TROPIPOC in the last 168 hours.   BNPNo results for input(s): BNP, PROBNP in the last 168 hours.   DDimer No results for input(s): DDIMER in the last 168 hours.   Summary of Pertinent studies    TTE: TTE July 11, 25: Ejection fraction 60 to 65%.  Grade 1 diastolic dysfunction.  Normal RV function.  Cardiac cath:   Severe multivessel  CAD with known occlusion of the RCA and progression of proximal LAD/LCx stenosis to 95% LAD and 90% LCx.   Mid LM to Prox LAD lesion is 20% stenosed with 40% stenosed side branch in Ost Cx.   Prox LAD lesion is 95% stenosed. Dist LAD lesion is 40% stenosed.   Ost Cx to Prox Cx lesion is 90% stenosed.  Mid Cx lesion is 50% stenosed.  Mid Cx to Dist Cx lesion is 60% stenosed with 50% stenosed side branch in 3rd Mrg.   Prox RCA to Dist RCA lesion is 100% stenosed with 100% stenosed side branch in RPAV.  Imaging: CT chest wo contrast ordered  Labs: Reviewed, mild anemia  Patient Profile     78 y.o. male  with history of CAD, hypertension, hepatic steatosis, hyperlipidemia, aortic atherosclerosis, anxiety, cardiac valve disease who is currently planned for CABG with maze and LAA exclusion tentatively Monday.  Assessment & Plan    Coronary artery disease Admitted with NSTEMI Multivessel, severe coronary disease Pt remains chest pain free Plan for CABG  Monday. Preoperative studies per cardiac surgery team. Normal EF Continue Lipitor  80, imdur  30, lopressor  50mg  BID, ranexa  500 mg, ASA 81    Atrial fibrillation New onset Possible PVI and LAA clip at time of CABG  HTN BP controlled On metoprolol  50 BID, losartan  50mg    For questions or updates, please contact [facility] Please consult [email or web address] for contact info under Cardiology/STEMI.      Signed, Eulas FORBES Furbish, MD 01/04/2024, 9:22 AM

## 2024-01-04 NOTE — Progress Notes (Signed)
 PHARMACY - ANTICOAGULATION CONSULT NOTE  Pharmacy Consult for heparin  dosage adjustment  Indication: chest pain/ACS and new onset atrial fibrillation   Allergies  Allergen Reactions   Metronidazole  Other (See Comments)    Patient stated that he had a moderate level of pain with his allergic reaction.    Ace Inhibitors Cough   Dilaudid  [Hydromorphone  Hcl] Nausea And Vomiting    Patient Measurements: Height: 5' 6 (167.6 cm) Weight: 97.1 kg (214 lb) IBW/kg (Calculated) : 63.8 HEPARIN  DW (KG): 84.9  Vital Signs: Temp: 98 F (36.7 C) (07/12 0757) Temp Source: Oral (07/12 0757) BP: 130/71 (07/12 0757) Pulse Rate: 62 (07/12 0757)  Labs: Recent Labs    01/01/24 2213 01/02/24 0004 01/02/24 0426 01/02/24 0734 01/02/24 0825 01/03/24 0458 01/03/24 1301 01/04/24 0446  HGB 12.1*  --  12.0*  --   --  11.5*  --  10.8*  HCT 33.3*  --  34.0*  --   --  32.7*  --  30.2*  PLT 187  --  188  --   --  178  --  157  APTT  --  28  --   --   --   --   --   --   LABPROT  --  14.4 14.6  --   --   --   --   --   INR  --  1.1 1.1  --   --   --   --   --   HEPARINUNFRC  --   --   --    < >  --  0.40 0.35 0.60  CREATININE 0.73  --  0.76  --   --  0.48*  --   --   TROPONINIHS 125* 1,199*  --   --  5,240*  --   --   --    < > = values in this interval not displayed.    Estimated Creatinine Clearance: 87 mL/min (A) (by C-G formula based on SCr of 0.48 mg/dL (L)).   Medical History: Past Medical History:  Diagnosis Date   Allergy    Angina of effort (HCC)    Anxiety    CAD (coronary artery disease), native coronary artery    coronary CTA showing aortic atherosclerosis with very high coronary Ca score at 4630 with severely calcified RCA and LAD > 70% and 50-69% prox to mid LCx.     Cancer (HCC)    Carpal tunnel syndrome    GERD (gastroesophageal reflux disease)    HLD (hyperlipidemia)    Hypertension    Lung nodule    Obesity    Pancreatitis 2015   Sleep apnea     Medications:   Scheduled:   aspirin  EC  81 mg Oral Daily   atorvastatin   80 mg Oral Daily   [START ON 01/05/2024] bisacodyl   5 mg Oral Once   [START ON 01/06/2024] chlorhexidine   15 mL Mouth/Throat Once   [START ON 01/05/2024] Chlorhexidine  Gluconate Cloth  6 each Topical Once   And   [START ON 01/06/2024] Chlorhexidine  Gluconate Cloth  6 each Topical Once   furosemide   20 mg Oral Daily   isosorbide  mononitrate  30 mg Oral Daily   losartan   50 mg Oral Daily   [START ON 01/06/2024] metoprolol  tartrate  12.5 mg Oral Once   metoprolol  tartrate  50 mg Oral BID   ranolazine   500 mg Oral BID   sodium chloride  flush  3 mL Intravenous Q12H   Infusions:  sodium chloride      heparin      PRN: sodium chloride , ALPRAZolam , sodium chloride  flush  Assessment: 78 year old male presenting with NSTEMI and new onset AFib transferred to [name] for CABG scheduled for 7/14. Notable PMH OSA, HTN, T2DM, CAD, hepatic steatosis. CHAD-VASC score of 4. Last CBC mostly stable with slight downtrend in Hgb.   Heparin  level of 0.6 on 7/12 within goal range of 0.3 - 0.7. Has been receiving 1,200 unit/hr heparin  infusion.  Goal of Therapy:  Heparin  level 0.3-0.7 units/ml Monitor platelets by anticoagulation protocol: Yes   Plan:  Continue heparin  infusion at 1,200 units/hr. Monitor heparin  level and CBC daily for hemoglobin and platelet count.   Maurilio Patten, PharmD PGY1 Pharmacy Resident [facility]  01/04/2024 9:24 AM

## 2024-01-04 NOTE — Progress Notes (Signed)
 Discussed with pt IS (2500 ml), sternal precautions, mobility post op, and d/c planning. He is very receptive, eager to have angina relief. He is moving around the room and can stand without using his arms. His wife will be with him at d/c. Encouraged ambulation in hall. Has [facility] booklet and preop video to view. Eager to do CRPII at [facility]. [id number] Aliene Aris BS, ACSM-CEP 01/04/2024 9:59 AM

## 2024-01-05 ENCOUNTER — Encounter (HOSPITAL_COMMUNITY): Payer: Self-pay | Admitting: Cardiology

## 2024-01-05 ENCOUNTER — Inpatient Hospital Stay (HOSPITAL_COMMUNITY)

## 2024-01-05 ENCOUNTER — Other Ambulatory Visit: Payer: Self-pay

## 2024-01-05 DIAGNOSIS — Z01810 Encounter for preprocedural cardiovascular examination: Secondary | ICD-10-CM

## 2024-01-05 DIAGNOSIS — I200 Unstable angina: Secondary | ICD-10-CM | POA: Diagnosis not present

## 2024-01-05 LAB — URINALYSIS, ROUTINE W REFLEX MICROSCOPIC
Bilirubin Urine: NEGATIVE
Glucose, UA: NEGATIVE mg/dL
Hgb urine dipstick: NEGATIVE
Ketones, ur: NEGATIVE mg/dL
Leukocytes,Ua: NEGATIVE
Nitrite: NEGATIVE
Protein, ur: NEGATIVE mg/dL
Specific Gravity, Urine: 1.009 (ref 1.005–1.030)
pH: 7 (ref 5.0–8.0)

## 2024-01-05 LAB — CBC
HCT: 29.5 % — ABNORMAL LOW (ref 39.0–52.0)
Hemoglobin: 10.6 g/dL — ABNORMAL LOW (ref 13.0–17.0)
MCH: 38.1 pg — ABNORMAL HIGH (ref 26.0–34.0)
MCHC: 35.9 g/dL (ref 30.0–36.0)
MCV: 106.1 fL — ABNORMAL HIGH (ref 80.0–100.0)
Platelets: 150 K/uL (ref 150–400)
RBC: 2.78 MIL/uL — ABNORMAL LOW (ref 4.22–5.81)
RDW: 11.9 % (ref 11.5–15.5)
WBC: 4.7 K/uL (ref 4.0–10.5)
nRBC: 0 % (ref 0.0–0.2)

## 2024-01-05 LAB — BLOOD GAS, ARTERIAL
Acid-base deficit: 2.1 mmol/L — ABNORMAL HIGH (ref 0.0–2.0)
Bicarbonate: 21.1 mmol/L (ref 20.0–28.0)
Drawn by: 38235
O2 Saturation: 99.4 %
Patient temperature: 37
pCO2 arterial: 31 mmHg — ABNORMAL LOW (ref 32–48)
pH, Arterial: 7.44 (ref 7.35–7.45)
pO2, Arterial: 116 mmHg — ABNORMAL HIGH (ref 83–108)

## 2024-01-05 LAB — PROTIME-INR
INR: 1.1 (ref 0.8–1.2)
Prothrombin Time: 15.2 s (ref 11.4–15.2)

## 2024-01-05 LAB — VAS US DOPPLER PRE CABG

## 2024-01-05 LAB — GLUCOSE, CAPILLARY
Glucose-Capillary: 125 mg/dL — ABNORMAL HIGH (ref 70–99)
Glucose-Capillary: 118 mg/dL — ABNORMAL HIGH (ref 70–99)
Glucose-Capillary: 137 mg/dL — ABNORMAL HIGH (ref 70–99)
Glucose-Capillary: 144 mg/dL — ABNORMAL HIGH (ref 70–99)

## 2024-01-05 LAB — HEPARIN LEVEL (UNFRACTIONATED): Heparin Unfractionated: 0.55 [IU]/mL (ref 0.30–0.70)

## 2024-01-05 LAB — SARS CORONAVIRUS 2 BY RT PCR: SARS Coronavirus 2 by RT PCR: NEGATIVE

## 2024-01-05 LAB — SURGICAL PCR SCREEN
MRSA, PCR: NEGATIVE
Staphylococcus aureus: NEGATIVE

## 2024-01-05 LAB — APTT: aPTT: 121 s — ABNORMAL HIGH (ref 24–36)

## 2024-01-05 MED ORDER — ACETAMINOPHEN 325 MG PO TABS
650.0000 mg | ORAL_TABLET | Freq: Four times a day (QID) | ORAL | Status: DC | PRN
  Administered 2024-01-05: 650 mg via ORAL
  Filled 2024-01-05: qty 2

## 2024-01-05 NOTE — Progress Notes (Signed)
 PHARMACY - ANTICOAGULATION CONSULT NOTE  Pharmacy Consult for heparin  dosage adjustment  Indication: chest pain/ACS and new onset atrial fibrillation.   Allergies  Allergen Reactions   Metronidazole  Other (See Comments)    Patient stated that he had a moderate level of pain with his allergic reaction.    Ace Inhibitors Cough   Dilaudid  [Hydromorphone  Hcl] Nausea And Vomiting    Patient Measurements: Height: 5' 6 (167.6 cm) Weight: 97.1 kg (214 lb) IBW/kg (Calculated) : 63.8 HEPARIN  DW (KG): 84.9  Vital Signs: Temp: 98.1 F (36.7 C) (07/13 0745) Temp Source: Oral (07/13 0745) BP: 132/71 (07/13 0745) Pulse Rate: 66 (07/13 0745)  Labs: Recent Labs    01/03/24 0458 01/03/24 1301 01/04/24 0446 01/05/24 0508  HGB 11.5*  --  10.8* 10.6*  HCT 32.7*  --  30.2* 29.5*  PLT 178  --  157 150  APTT  --   --   --  121*  LABPROT  --   --   --  15.2  INR  --   --   --  1.1  HEPARINUNFRC 0.40 0.35 0.60 0.55  CREATININE 0.48*  --   --   --     Estimated Creatinine Clearance: 87 mL/min (A) (by C-G formula based on SCr of 0.48 mg/dL (L)).   Medical History: Past Medical History:  Diagnosis Date   Allergy    Angina of effort (HCC)    Anxiety    CAD (coronary artery disease), native coronary artery    coronary CTA showing aortic atherosclerosis with very high coronary Ca score at 4630 with severely calcified RCA and LAD > 70% and 50-69% prox to mid LCx.     Cancer (HCC)    Carpal tunnel syndrome    GERD (gastroesophageal reflux disease)    HLD (hyperlipidemia)    Hypertension    Lung nodule    Obesity    Pancreatitis 2015   Sleep apnea     Medications:  Scheduled:   aspirin  EC  81 mg Oral Daily   atorvastatin   80 mg Oral Daily   [START ON 01/06/2024] chlorhexidine   15 mL Mouth/Throat Once   Chlorhexidine  Gluconate Cloth  6 each Topical Once   And   [START ON 01/06/2024] Chlorhexidine  Gluconate Cloth  6 each Topical Once   [START ON 01/06/2024] epinephrine   0-10 mcg/min  Intravenous To OR   furosemide   20 mg Oral Daily   [START ON 01/06/2024] heparin  sodium (porcine) 2,500 Units, papaverine 30 mg in electrolyte-A (PLASMALYTE-A PH 7.4) 500 mL irrigation   Irrigation To OR   [START ON 01/06/2024] insulin    Intravenous To OR   isosorbide  mononitrate  30 mg Oral Daily   [START ON 01/06/2024] magnesium  sulfate  40 mEq Other To OR   [START ON 01/06/2024] metoprolol  tartrate  12.5 mg Oral Once   metoprolol  tartrate  50 mg Oral BID   [START ON 01/06/2024] phenylephrine   30-200 mcg/min Intravenous To OR   [START ON 01/06/2024] potassium chloride   80 mEq Other To OR   ranolazine   500 mg Oral BID   sodium chloride  flush  3 mL Intravenous Q12H   [START ON 01/06/2024] tranexamic acid   15 mg/kg Intravenous To OR   [START ON 01/06/2024] tranexamic acid   2 mg/kg Intracatheter To OR   Infusions:   [START ON 01/06/2024]  ceFAZolin  (ANCEF ) IV     [START ON 01/06/2024]  ceFAZolin  (ANCEF ) IV     [START ON 01/06/2024] dexmedetomidine      [START  ON 01/06/2024] heparin  30,000 units/NS 1000 mL solution for CELLSAVER     heparin  1,200 Units/hr (01/04/24 1608)   [START ON 01/06/2024] milrinone      [START ON 01/06/2024] nitroGLYCERIN      [START ON 01/06/2024] norepinephrine      [START ON 01/06/2024] tranexamic acid  (CYKLOKAPRON ) 2,500 mg in sodium chloride  0.9 % 250 mL (10 mg/mL) infusion     [START ON 01/06/2024] vancomycin      PRN: ALPRAZolam , sodium chloride  flush  Assessment: 78 year old male presenting with NSTEMI and new onset AFib transferred to [name] for CABG scheduled for 7/14. Notable PMH OSA, HTN, T2DM, CAD, hepatic steatosis. CHAD-VASC score of 4. Last CBC mostly stable with slight downtrend in Hgb.   Heparin  level of 0.55 on 7/13 within goal range of 0.3 - 0.7. Has been receiving 1,200 unit/hr heparin  infusion.  Goal of Therapy:  Heparin  level 0.3-0.7 units/ml Monitor platelets by anticoagulation protocol: Yes   Plan:  Continue heparin  infusion at 1,200 units/hr. Monitor  heparin  level and CBC daily for hemoglobin and platelet count.   Maurilio Patten, PharmD PGY1 Pharmacy Resident [facility]  01/05/2024 9:13 AM

## 2024-01-05 NOTE — Progress Notes (Signed)
 VASCULAR LAB    Pre CABG Dopplers have been performed.  See CV proc for preliminary results.   Greenley Martone, RVT 01/05/2024, 1:03 PM `

## 2024-01-05 NOTE — Progress Notes (Signed)
 Progress Note  Patient Name: Daniel Holden Date of Encounter: 01/05/2024  Primary Cardiologist: Wilbert Bihari, MD   Subjective   Feels great -- no chest pain since AF resolved. Undergoing carotid ultrasound.  Inpatient Medications    Scheduled Meds:  aspirin  EC  81 mg Oral Daily   atorvastatin   80 mg Oral Daily   [START ON 01/06/2024] chlorhexidine   15 mL Mouth/Throat Once   Chlorhexidine  Gluconate Cloth  6 each Topical Once   And   [START ON 01/06/2024] Chlorhexidine  Gluconate Cloth  6 each Topical Once   [START ON 01/06/2024] epinephrine   0-10 mcg/min Intravenous To OR   furosemide   20 mg Oral Daily   [START ON 01/06/2024] heparin  sodium (porcine) 2,500 Units, papaverine 30 mg in electrolyte-A (PLASMALYTE-A PH 7.4) 500 mL irrigation   Irrigation To OR   [START ON 01/06/2024] insulin    Intravenous To OR   isosorbide  mononitrate  30 mg Oral Daily   [START ON 01/06/2024] magnesium  sulfate  40 mEq Other To OR   [START ON 01/06/2024] metoprolol  tartrate  12.5 mg Oral Once   metoprolol  tartrate  50 mg Oral BID   [START ON 01/06/2024] phenylephrine   30-200 mcg/min Intravenous To OR   [START ON 01/06/2024] potassium chloride   80 mEq Other To OR   ranolazine   500 mg Oral BID   sodium chloride  flush  3 mL Intravenous Q12H   [START ON 01/06/2024] tranexamic acid   15 mg/kg Intravenous To OR   [START ON 01/06/2024] tranexamic acid   2 mg/kg Intracatheter To OR   Continuous Infusions:  [START ON 01/06/2024]  ceFAZolin  (ANCEF ) IV     [START ON 01/06/2024]  ceFAZolin  (ANCEF ) IV     [START ON 01/06/2024] dexmedetomidine      [START ON 01/06/2024] heparin  30,000 units/NS 1000 mL solution for CELLSAVER     heparin  1,200 Units/hr (01/04/24 1608)   [START ON 01/06/2024] milrinone      [START ON 01/06/2024] nitroGLYCERIN      [START ON 01/06/2024] norepinephrine      [START ON 01/06/2024] tranexamic acid  (CYKLOKAPRON ) 2,500 mg in sodium chloride  0.9 % 250 mL (10 mg/mL) infusion     [START ON 01/06/2024]  vancomycin      PRN Meds: ALPRAZolam , sodium chloride  flush   Vital Signs    Vitals:   01/04/24 2012 01/04/24 2104 01/05/24 0509 01/05/24 0745  BP: 113/65 113/65 128/62 132/71  Pulse: 64 65 (!) 56 66  Resp: 20  15 18   Temp: 98 F (36.7 C)  98.3 F (36.8 C) 98.1 F (36.7 C)  TempSrc: Oral  Oral Oral  SpO2: 95%  98% 99%  Weight:      Height:        Intake/Output Summary (Last 24 hours) at 01/05/2024 1202 Last data filed at 01/04/2024 2126 Gross per 24 hour  Intake 580 ml  Output --  Net 580 ml   Filed Weights   01/03/24 1615  Weight: 97.1 kg    Telemetry    Sinus rhythm - Personally Reviewed  ECG    7/9: AF with RVR - Personally Reviewed  Physical Exam   GEN: No acute distress.   Neck: No JVD Cardiac: RRR, no murmurs, rubs, or gallops.  Respiratory: Clear to auscultation bilaterally. GI: Soft, nontender, non-distended  MS: No edema; No deformity. Neuro:  Nonfocal  Psych: Normal affect   Labs    Chemistry Recent Labs  Lab 01/01/24 2213 01/02/24 0426 01/03/24 0458  NA 128* 133* 136  K 3.5 3.7 3.6  CL 92* 96* 102  CO2 24 24 25   GLUCOSE 163* 141* 132*  BUN 15 13 10   CREATININE 0.73 0.76 0.48*  CALCIUM  9.1 9.4 8.9  GFRNONAA >60 >60 >60  ANIONGAP 12 13 9      Hematology Recent Labs  Lab 01/03/24 0458 01/04/24 0446 01/05/24 0508  WBC 5.3 5.9 4.7  RBC 3.09* 2.83* 2.78*  HGB 11.5* 10.8* 10.6*  HCT 32.7* 30.2* 29.5*  MCV 105.8* 106.7* 106.1*  MCH 37.2* 38.2* 38.1*  MCHC 35.2 35.8 35.9  RDW 12.1 12.0 11.9  PLT 178 157 150    Cardiac EnzymesNo results for input(s): TROPONINI in the last 168 hours. No results for input(s): TROPIPOC in the last 168 hours.   BNPNo results for input(s): BNP, PROBNP in the last 168 hours.   DDimer No results for input(s): DDIMER in the last 168 hours.   Summary of Pertinent studies    TTE: TTE July 11, 25: Ejection fraction 60 to 65%.  Grade 1 diastolic dysfunction.  Normal RV function.  Cardiac  cath:   Severe multivessel CAD with known occlusion of the RCA and progression of proximal LAD/LCx stenosis to 95% LAD and 90% LCx.   Mid LM to Prox LAD lesion is 20% stenosed with 40% stenosed side branch in Ost Cx.   Prox LAD lesion is 95% stenosed. Dist LAD lesion is 40% stenosed.   Ost Cx to Prox Cx lesion is 90% stenosed.  Mid Cx lesion is 50% stenosed.  Mid Cx to Dist Cx lesion is 60% stenosed with 50% stenosed side branch in 3rd Mrg.   Prox RCA to Dist RCA lesion is 100% stenosed with 100% stenosed side branch in RPAV.  Imaging: CT chest wo contrast ordered  Labs: Reviewed, mild anemia  Patient Profile     78 y.o. male  with history of CAD, hypertension, hepatic steatosis, hyperlipidemia, aortic atherosclerosis, anxiety, cardiac valve disease who is currently planned for CABG with maze and LAA exclusion tentatively Monday.  Assessment & Plan    Coronary artery disease Admitted with NSTEMI Multivessel, severe coronary disease Pt remains chest pain free Plan for CABG tomorrow -- Dr. Kerrin has seen the patient today Preoperative studies per cardiac surgery team. Normal EF Continue Lipitor  80, imdur  30, lopressor  50mg  BID, ranexa  500 mg, ASA 81    Atrial fibrillation New onset Possible PVI and LAA clip at time of CABG  HTN BP controlled On metoprolol  50 BID, losartan  50mg    For questions or updates, please contact [facility] Please consult [email or web address] for contact info under Cardiology/STEMI.      Signed, Eulas FORBES Furbish, MD 01/05/2024, 12:02 PM

## 2024-01-06 ENCOUNTER — Inpatient Hospital Stay (HOSPITAL_COMMUNITY): Payer: Self-pay | Admitting: Critical Care Medicine

## 2024-01-06 ENCOUNTER — Encounter (HOSPITAL_COMMUNITY): Payer: Self-pay | Admitting: Cardiology

## 2024-01-06 ENCOUNTER — Inpatient Hospital Stay (HOSPITAL_COMMUNITY)

## 2024-01-06 ENCOUNTER — Other Ambulatory Visit (HOSPITAL_COMMUNITY): Payer: Self-pay

## 2024-01-06 ENCOUNTER — Inpatient Hospital Stay (HOSPITAL_COMMUNITY)
Admission: AD | Disposition: A | Discharge status: Home / Self Care | Payer: Self-pay | Source: Other Acute Inpatient Hospital | Attending: Cardiology

## 2024-01-06 ENCOUNTER — Other Ambulatory Visit: Payer: Self-pay

## 2024-01-06 DIAGNOSIS — Z951 Presence of aortocoronary bypass graft: Secondary | ICD-10-CM

## 2024-01-06 DIAGNOSIS — G4733 Obstructive sleep apnea (adult) (pediatric): Secondary | ICD-10-CM | POA: Diagnosis not present

## 2024-01-06 DIAGNOSIS — I10 Essential (primary) hypertension: Secondary | ICD-10-CM

## 2024-01-06 DIAGNOSIS — I480 Paroxysmal atrial fibrillation: Secondary | ICD-10-CM

## 2024-01-06 DIAGNOSIS — I4891 Unspecified atrial fibrillation: Secondary | ICD-10-CM | POA: Diagnosis not present

## 2024-01-06 DIAGNOSIS — I214 Non-ST elevation (NSTEMI) myocardial infarction: Secondary | ICD-10-CM

## 2024-01-06 DIAGNOSIS — I2510 Atherosclerotic heart disease of native coronary artery without angina pectoris: Secondary | ICD-10-CM

## 2024-01-06 LAB — POCT I-STAT, CHEM 8
BUN: 12 mg/dL (ref 8–23)
BUN: 12 mg/dL (ref 8–23)
BUN: 10 mg/dL (ref 8–23)
BUN: 13 mg/dL (ref 8–23)
BUN: 12 mg/dL (ref 8–23)
Calcium, Ion: 1.3 mmol/L (ref 1.15–1.40)
Calcium, Ion: 1.27 mmol/L (ref 1.15–1.40)
Calcium, Ion: 1.06 mmol/L — ABNORMAL LOW (ref 1.15–1.40)
Calcium, Ion: 1.07 mmol/L — ABNORMAL LOW (ref 1.15–1.40)
Calcium, Ion: 1.11 mmol/L — ABNORMAL LOW (ref 1.15–1.40)
Chloride: 103 mmol/L (ref 98–111)
Chloride: 102 mmol/L (ref 98–111)
Chloride: 100 mmol/L (ref 98–111)
Chloride: 103 mmol/L (ref 98–111)
Chloride: 102 mmol/L (ref 98–111)
Creatinine, Ser: 0.8 mg/dL (ref 0.61–1.24)
Creatinine, Ser: 0.7 mg/dL (ref 0.61–1.24)
Creatinine, Ser: 0.6 mg/dL — ABNORMAL LOW (ref 0.61–1.24)
Creatinine, Ser: 0.7 mg/dL (ref 0.61–1.24)
Creatinine, Ser: 0.7 mg/dL (ref 0.61–1.24)
Glucose, Bld: 126 mg/dL — ABNORMAL HIGH (ref 70–99)
Glucose, Bld: 120 mg/dL — ABNORMAL HIGH (ref 70–99)
Glucose, Bld: 121 mg/dL — ABNORMAL HIGH (ref 70–99)
Glucose, Bld: 94 mg/dL (ref 70–99)
Glucose, Bld: 144 mg/dL — ABNORMAL HIGH (ref 70–99)
HCT: 31 % — ABNORMAL LOW (ref 39.0–52.0)
HCT: 29 % — ABNORMAL LOW (ref 39.0–52.0)
HCT: 21 % — ABNORMAL LOW (ref 39.0–52.0)
HCT: 22 % — ABNORMAL LOW (ref 39.0–52.0)
HCT: 32 % — ABNORMAL LOW (ref 39.0–52.0)
Hemoglobin: 10.5 g/dL — ABNORMAL LOW (ref 13.0–17.0)
Hemoglobin: 9.9 g/dL — ABNORMAL LOW (ref 13.0–17.0)
Hemoglobin: 7.1 g/dL — ABNORMAL LOW (ref 13.0–17.0)
Hemoglobin: 7.5 g/dL — ABNORMAL LOW (ref 13.0–17.0)
Hemoglobin: 10.9 g/dL — ABNORMAL LOW (ref 13.0–17.0)
Potassium: 3.7 mmol/L (ref 3.5–5.1)
Potassium: 3.8 mmol/L (ref 3.5–5.1)
Potassium: 4.3 mmol/L (ref 3.5–5.1)
Potassium: 5 mmol/L (ref 3.5–5.1)
Potassium: 4 mmol/L (ref 3.5–5.1)
Sodium: 137 mmol/L (ref 135–145)
Sodium: 135 mmol/L (ref 135–145)
Sodium: 135 mmol/L (ref 135–145)
Sodium: 136 mmol/L (ref 135–145)
Sodium: 137 mmol/L (ref 135–145)
TCO2: 21 mmol/L — ABNORMAL LOW (ref 22–32)
TCO2: 22 mmol/L (ref 22–32)
TCO2: 24 mmol/L (ref 22–32)
TCO2: 24 mmol/L (ref 22–32)
TCO2: 22 mmol/L (ref 22–32)

## 2024-01-06 LAB — POCT I-STAT 7, (LYTES, BLD GAS, ICA,H+H)
Acid-base deficit: 5 mmol/L — ABNORMAL HIGH (ref 0.0–2.0)
Acid-base deficit: 6 mmol/L — ABNORMAL HIGH (ref 0.0–2.0)
Acid-base deficit: 1 mmol/L (ref 0.0–2.0)
Acid-base deficit: 3 mmol/L — ABNORMAL HIGH (ref 0.0–2.0)
Acid-base deficit: 4 mmol/L — ABNORMAL HIGH (ref 0.0–2.0)
Acid-base deficit: 6 mmol/L — ABNORMAL HIGH (ref 0.0–2.0)
Acid-base deficit: 4 mmol/L — ABNORMAL HIGH (ref 0.0–2.0)
Bicarbonate: 21.1 mmol/L (ref 20.0–28.0)
Bicarbonate: 19.1 mmol/L — ABNORMAL LOW (ref 20.0–28.0)
Bicarbonate: 21.8 mmol/L (ref 20.0–28.0)
Bicarbonate: 23.4 mmol/L (ref 20.0–28.0)
Bicarbonate: 21.7 mmol/L (ref 20.0–28.0)
Bicarbonate: 18.9 mmol/L — ABNORMAL LOW (ref 20.0–28.0)
Bicarbonate: 20.3 mmol/L (ref 20.0–28.0)
Calcium, Ion: 1.26 mmol/L (ref 1.15–1.40)
Calcium, Ion: 1.04 mmol/L — ABNORMAL LOW (ref 1.15–1.40)
Calcium, Ion: 1.07 mmol/L — ABNORMAL LOW (ref 1.15–1.40)
Calcium, Ion: 1.12 mmol/L — ABNORMAL LOW (ref 1.15–1.40)
Calcium, Ion: 1.12 mmol/L — ABNORMAL LOW (ref 1.15–1.40)
Calcium, Ion: 1.06 mmol/L — ABNORMAL LOW (ref 1.15–1.40)
Calcium, Ion: 1.05 mmol/L — ABNORMAL LOW (ref 1.15–1.40)
HCT: 31 % — ABNORMAL LOW (ref 39.0–52.0)
HCT: 22 % — ABNORMAL LOW (ref 39.0–52.0)
HCT: 23 % — ABNORMAL LOW (ref 39.0–52.0)
HCT: 23 % — ABNORMAL LOW (ref 39.0–52.0)
HCT: 25 % — ABNORMAL LOW (ref 39.0–52.0)
HCT: 22 % — ABNORMAL LOW (ref 39.0–52.0)
HCT: 23 % — ABNORMAL LOW (ref 39.0–52.0)
Hemoglobin: 10.5 g/dL — ABNORMAL LOW (ref 13.0–17.0)
Hemoglobin: 7.5 g/dL — ABNORMAL LOW (ref 13.0–17.0)
Hemoglobin: 7.8 g/dL — ABNORMAL LOW (ref 13.0–17.0)
Hemoglobin: 7.8 g/dL — ABNORMAL LOW (ref 13.0–17.0)
Hemoglobin: 8.5 g/dL — ABNORMAL LOW (ref 13.0–17.0)
Hemoglobin: 7.5 g/dL — ABNORMAL LOW (ref 13.0–17.0)
Hemoglobin: 7.8 g/dL — ABNORMAL LOW (ref 13.0–17.0)
O2 Saturation: 100 %
O2 Saturation: 100 %
O2 Saturation: 100 %
O2 Saturation: 99 %
O2 Saturation: 97 %
O2 Saturation: 99 %
O2 Saturation: 98 %
Patient temperature: 35.4
Patient temperature: 36.8
Patient temperature: 37.1
Potassium: 3.7 mmol/L (ref 3.5–5.1)
Potassium: 4.3 mmol/L (ref 3.5–5.1)
Potassium: 4 mmol/L (ref 3.5–5.1)
Potassium: 5 mmol/L (ref 3.5–5.1)
Potassium: 3.7 mmol/L (ref 3.5–5.1)
Potassium: 4 mmol/L (ref 3.5–5.1)
Potassium: 4.1 mmol/L (ref 3.5–5.1)
Sodium: 137 mmol/L (ref 135–145)
Sodium: 136 mmol/L (ref 135–145)
Sodium: 136 mmol/L (ref 135–145)
Sodium: 138 mmol/L (ref 135–145)
Sodium: 139 mmol/L (ref 135–145)
Sodium: 139 mmol/L (ref 135–145)
Sodium: 139 mmol/L (ref 135–145)
TCO2: 22 mmol/L (ref 22–32)
TCO2: 20 mmol/L — ABNORMAL LOW (ref 22–32)
TCO2: 23 mmol/L (ref 22–32)
TCO2: 25 mmol/L (ref 22–32)
TCO2: 23 mmol/L (ref 22–32)
TCO2: 20 mmol/L — ABNORMAL LOW (ref 22–32)
TCO2: 21 mmol/L — ABNORMAL LOW (ref 22–32)
pCO2 arterial: 41.1 mmHg (ref 32–48)
pCO2 arterial: 34.9 mmHg (ref 32–48)
pCO2 arterial: 35.7 mmHg (ref 32–48)
pCO2 arterial: 37.6 mmHg (ref 32–48)
pCO2 arterial: 38.9 mmHg (ref 32–48)
pCO2 arterial: 33.2 mmHg (ref 32–48)
pCO2 arterial: 32.1 mmHg (ref 32–48)
pH, Arterial: 7.319 — ABNORMAL LOW (ref 7.35–7.45)
pH, Arterial: 7.347 — ABNORMAL LOW (ref 7.35–7.45)
pH, Arterial: 7.372 (ref 7.35–7.45)
pH, Arterial: 7.425 (ref 7.35–7.45)
pH, Arterial: 7.347 — ABNORMAL LOW (ref 7.35–7.45)
pH, Arterial: 7.362 (ref 7.35–7.45)
pH, Arterial: 7.41 (ref 7.35–7.45)
pO2, Arterial: 200 mmHg — ABNORMAL HIGH (ref 83–108)
pO2, Arterial: 429 mmHg — ABNORMAL HIGH (ref 83–108)
pO2, Arterial: 131 mmHg — ABNORMAL HIGH (ref 83–108)
pO2, Arterial: 291 mmHg — ABNORMAL HIGH (ref 83–108)
pO2, Arterial: 92 mmHg (ref 83–108)
pO2, Arterial: 118 mmHg — ABNORMAL HIGH (ref 83–108)
pO2, Arterial: 98 mmHg (ref 83–108)

## 2024-01-06 LAB — GLUCOSE, CAPILLARY
Glucose-Capillary: 135 mg/dL — ABNORMAL HIGH (ref 70–99)
Glucose-Capillary: 114 mg/dL — ABNORMAL HIGH (ref 70–99)
Glucose-Capillary: 81 mg/dL (ref 70–99)
Glucose-Capillary: 123 mg/dL — ABNORMAL HIGH (ref 70–99)
Glucose-Capillary: 123 mg/dL — ABNORMAL HIGH (ref 70–99)
Glucose-Capillary: 116 mg/dL — ABNORMAL HIGH (ref 70–99)
Glucose-Capillary: 156 mg/dL — ABNORMAL HIGH (ref 70–99)

## 2024-01-06 LAB — PROTIME-INR
INR: 1.6 — ABNORMAL HIGH (ref 0.8–1.2)
Prothrombin Time: 19.7 s — ABNORMAL HIGH (ref 11.4–15.2)

## 2024-01-06 LAB — BASIC METABOLIC PANEL WITH GFR
Anion gap: 12 (ref 5–15)
BUN: 13 mg/dL (ref 8–23)
CO2: 19 mmol/L — ABNORMAL LOW (ref 22–32)
Calcium: 9.4 mg/dL (ref 8.9–10.3)
Chloride: 102 mmol/L (ref 98–111)
Creatinine, Ser: 0.93 mg/dL (ref 0.61–1.24)
GFR, Estimated: >60 mL/min (ref >60)
Glucose, Bld: 136 mg/dL — ABNORMAL HIGH (ref 70–99)
Potassium: 3.8 mmol/L (ref 3.5–5.1)
Sodium: 133 mmol/L — ABNORMAL LOW (ref 135–145)

## 2024-01-06 LAB — CBC
HCT: 34 % — ABNORMAL LOW (ref 39.0–52.0)
HCT: 24.9 % — ABNORMAL LOW (ref 39.0–52.0)
Hemoglobin: 11.8 g/dL — ABNORMAL LOW (ref 13.0–17.0)
Hemoglobin: 8.8 g/dL — ABNORMAL LOW (ref 13.0–17.0)
MCH: 37.5 pg — ABNORMAL HIGH (ref 26.0–34.0)
MCH: 37.9 pg — ABNORMAL HIGH (ref 26.0–34.0)
MCHC: 34.7 g/dL (ref 30.0–36.0)
MCHC: 35.3 g/dL (ref 30.0–36.0)
MCV: 107.9 fL — ABNORMAL HIGH (ref 80.0–100.0)
MCV: 107.3 fL — ABNORMAL HIGH (ref 80.0–100.0)
Platelets: 181 K/uL (ref 150–400)
Platelets: 103 K/uL — ABNORMAL LOW (ref 150–400)
RBC: 3.15 MIL/uL — ABNORMAL LOW (ref 4.22–5.81)
RBC: 2.32 MIL/uL — ABNORMAL LOW (ref 4.22–5.81)
RDW: 11.9 % (ref 11.5–15.5)
RDW: 11.9 % (ref 11.5–15.5)
WBC: 5.8 K/uL (ref 4.0–10.5)
WBC: 9.3 K/uL (ref 4.0–10.5)
nRBC: 0 % (ref 0.0–0.2)
nRBC: 0 % (ref 0.0–0.2)

## 2024-01-06 LAB — HEMOGLOBIN AND HEMATOCRIT, BLOOD
HCT: 21.7 % — ABNORMAL LOW (ref 39.0–52.0)
Hemoglobin: 7.6 g/dL — ABNORMAL LOW (ref 13.0–17.0)

## 2024-01-06 LAB — PLATELET COUNT: Platelets: 127 K/uL — ABNORMAL LOW (ref 150–400)

## 2024-01-06 LAB — APTT: aPTT: 34 s (ref 24–36)

## 2024-01-06 LAB — LIPOPROTEIN A (LPA): Lipoprotein (a): <8.4 nmol/L (ref <75.0)

## 2024-01-06 LAB — HEPARIN LEVEL (UNFRACTIONATED): Heparin Unfractionated: 0.43 [IU]/mL (ref 0.30–0.70)

## 2024-01-06 LAB — MAGNESIUM: Magnesium: 3 mg/dL — ABNORMAL HIGH (ref 1.7–2.4)

## 2024-01-06 LAB — PREPARE RBC (CROSSMATCH)

## 2024-01-06 SURGERY — CORONARY ARTERY BYPASS GRAFTING (CABG)
Anesthesia: General | Site: Chest

## 2024-01-06 MED ORDER — FENTANYL CITRATE (PF) 250 MCG/5ML IJ SOLN
INTRAMUSCULAR | Status: AC
  Filled 2024-01-06: qty 5

## 2024-01-06 MED ORDER — MIDAZOLAM HCL (PF) 10 MG/2ML IJ SOLN
INTRAMUSCULAR | Status: AC
  Filled 2024-01-06: qty 2

## 2024-01-06 MED ORDER — OXYCODONE HCL 5 MG PO TABS
5.0000 mg | ORAL_TABLET | ORAL | Status: DC | PRN
  Administered 2024-01-06 – 2024-01-07 (×2): 5 mg via ORAL
  Administered 2024-01-07: 10 mg via ORAL
  Administered 2024-01-07: 5 mg via ORAL
  Administered 2024-01-07: 10 mg via ORAL
  Filled 2024-01-06: qty 2
  Filled 2024-01-06 (×2): qty 1
  Filled 2024-01-06: qty 2
  Filled 2024-01-06: qty 1

## 2024-01-06 MED ORDER — METOPROLOL TARTRATE 5 MG/5ML IV SOLN: 2.5000 mg | INTRAVENOUS | Status: DC | PRN

## 2024-01-06 MED ORDER — HYDROMORPHONE HCL 1 MG/ML IJ SOLN: 0.5000 mg | Freq: Once | INTRAMUSCULAR | Status: DC

## 2024-01-06 MED ORDER — METOPROLOL TARTRATE 12.5 MG HALF TABLET
12.5000 mg | ORAL_TABLET | Freq: Two times a day (BID) | ORAL | Status: DC
  Administered 2024-01-07 (×2): 12.5 mg via ORAL
  Filled 2024-01-06 (×3): qty 1

## 2024-01-06 MED ORDER — HEPARIN SODIUM (PORCINE) 1000 UNIT/ML IJ SOLN
INTRAMUSCULAR | Status: AC
  Filled 2024-01-06: qty 1

## 2024-01-06 MED ORDER — SODIUM CHLORIDE (PF) 0.9 % IJ SOLN: OROMUCOSAL | Status: DC | PRN

## 2024-01-06 MED ORDER — DEXTROSE 50 % IV SOLN: 0.0000 mL | INTRAVENOUS | Status: DC | PRN

## 2024-01-06 MED ORDER — HEPARIN SODIUM (PORCINE) 1000 UNIT/ML IJ SOLN
INTRAMUSCULAR | Status: DC | PRN
  Administered 2024-01-06: 2000 [IU] via INTRAVENOUS
  Administered 2024-01-06: 37000 [IU] via INTRAVENOUS

## 2024-01-06 MED ORDER — METOCLOPRAMIDE HCL 5 MG/ML IJ SOLN
10.0000 mg | Freq: Four times a day (QID) | INTRAMUSCULAR | Status: AC
  Administered 2024-01-06 – 2024-01-08 (×6): 10 mg via INTRAVENOUS
  Filled 2024-01-06 (×6): qty 2

## 2024-01-06 MED ORDER — PROTAMINE SULFATE 10 MG/ML IV SOLN
INTRAVENOUS | Status: DC | PRN
  Administered 2024-01-06: 390 mg via INTRAVENOUS

## 2024-01-06 MED ORDER — FENTANYL CITRATE (PF) 250 MCG/5ML IJ SOLN
INTRAMUSCULAR | Status: DC | PRN
  Administered 2024-01-06: 100 ug via INTRAVENOUS
  Administered 2024-01-06: 200 ug via INTRAVENOUS
  Administered 2024-01-06: 100 ug via INTRAVENOUS
  Administered 2024-01-06: 50 ug via INTRAVENOUS
  Administered 2024-01-06: 250 ug via INTRAVENOUS
  Administered 2024-01-06 (×3): 50 ug via INTRAVENOUS
  Administered 2024-01-06: 150 ug via INTRAVENOUS

## 2024-01-06 MED ORDER — ASPIRIN 325 MG PO TBEC
325.0000 mg | DELAYED_RELEASE_TABLET | Freq: Every day | ORAL | Status: DC
  Administered 2024-01-07 – 2024-01-08 (×2): 325 mg via ORAL
  Filled 2024-01-06 (×2): qty 1

## 2024-01-06 MED ORDER — CHLORHEXIDINE GLUCONATE 0.12 % MT SOLN
15.0000 mL | OROMUCOSAL | Status: AC
  Administered 2024-01-06: 15 mL via OROMUCOSAL
  Filled 2024-01-06: qty 15

## 2024-01-06 MED ORDER — SODIUM CHLORIDE 0.9% FLUSH
3.0000 mL | Freq: Two times a day (BID) | INTRAVENOUS | Status: DC
  Administered 2024-01-07 – 2024-01-09 (×6): 3 mL via INTRAVENOUS

## 2024-01-06 MED ORDER — DOCUSATE SODIUM 100 MG PO CAPS
200.0000 mg | ORAL_CAPSULE | Freq: Every day | ORAL | Status: DC
  Administered 2024-01-07 – 2024-01-09 (×3): 200 mg via ORAL
  Filled 2024-01-06 (×3): qty 2

## 2024-01-06 MED ORDER — MAGNESIUM SULFATE 4 GM/100ML IV SOLN
4.0000 g | Freq: Once | INTRAVENOUS | Status: AC
  Administered 2024-01-06: 4 g via INTRAVENOUS
  Filled 2024-01-06: qty 100

## 2024-01-06 MED ORDER — MIDAZOLAM HCL 2 MG/2ML IJ SOLN
2.0000 mg | INTRAMUSCULAR | Status: DC | PRN
Start: 2024-01-06 — End: 2024-01-09

## 2024-01-06 MED ORDER — CHLORHEXIDINE GLUCONATE CLOTH 2 % EX PADS
6.0000 | MEDICATED_PAD | Freq: Every day | CUTANEOUS | Status: DC
  Administered 2024-01-06 – 2024-01-08 (×3): 6 via TOPICAL

## 2024-01-06 MED ORDER — PROTAMINE SULFATE 10 MG/ML IV SOLN
INTRAVENOUS | Status: AC
  Filled 2024-01-06: qty 50

## 2024-01-06 MED ORDER — CEFAZOLIN SODIUM-DEXTROSE 2-4 GM/100ML-% IV SOLN
2.0000 g | Freq: Three times a day (TID) | INTRAVENOUS | Status: AC
  Administered 2024-01-06 – 2024-01-08 (×6): 2 g via INTRAVENOUS
  Filled 2024-01-06 (×6): qty 100

## 2024-01-06 MED ORDER — HEPARIN SODIUM (PORCINE) 1000 UNIT/ML IJ SOLN
INTRAMUSCULAR | Status: AC
Start: 2024-01-06 — End: 2024-01-06
  Filled 2024-01-06: qty 10

## 2024-01-06 MED ORDER — ACETAMINOPHEN 160 MG/5ML PO SOLN: 1000.0000 mg | Freq: Four times a day (QID) | ORAL | Status: DC

## 2024-01-06 MED ORDER — POTASSIUM CHLORIDE 10 MEQ/50ML IV SOLN
10.0000 meq | INTRAVENOUS | Status: AC
  Administered 2024-01-06 (×3): 10 meq via INTRAVENOUS

## 2024-01-06 MED ORDER — DEXMEDETOMIDINE HCL IN NACL 400 MCG/100ML IV SOLN
0.0000 ug/kg/h | INTRAVENOUS | Status: DC
  Administered 2024-01-06: 0.5 ug/kg/h via INTRAVENOUS
  Filled 2024-01-06: qty 100

## 2024-01-06 MED ORDER — SODIUM CHLORIDE 0.45 % IV SOLN: INTRAVENOUS | Status: DC | PRN

## 2024-01-06 MED ORDER — ROCURONIUM BROMIDE 10 MG/ML (PF) SYRINGE
PREFILLED_SYRINGE | INTRAVENOUS | Status: AC
Start: 2024-01-06 — End: 2024-01-06
  Filled 2024-01-06: qty 10

## 2024-01-06 MED ORDER — HEPARIN SODIUM (PORCINE) 1000 UNIT/ML IJ SOLN
INTRAMUSCULAR | Status: AC
  Filled 2024-01-06: qty 10

## 2024-01-06 MED ORDER — VANCOMYCIN HCL IN DEXTROSE 1-5 GM/200ML-% IV SOLN
1000.0000 mg | Freq: Once | INTRAVENOUS | Status: AC
  Administered 2024-01-06: 1000 mg via INTRAVENOUS
  Filled 2024-01-06: qty 200

## 2024-01-06 MED ORDER — ROCURONIUM BROMIDE 10 MG/ML (PF) SYRINGE
PREFILLED_SYRINGE | INTRAVENOUS | Status: DC | PRN
  Administered 2024-01-06: 70 mg via INTRAVENOUS
  Administered 2024-01-06: 30 mg via INTRAVENOUS
  Administered 2024-01-06: 50 mg via INTRAVENOUS
  Administered 2024-01-06: 20 mg via INTRAVENOUS

## 2024-01-06 MED ORDER — ORAL CARE MOUTH RINSE: 15.0000 mL | Freq: Once | OROMUCOSAL | Status: DC

## 2024-01-06 MED ORDER — CHLORHEXIDINE GLUCONATE 0.12 % MT SOLN: 15.0000 mL | Freq: Once | OROMUCOSAL | Status: DC

## 2024-01-06 MED ORDER — INSULIN REGULAR(HUMAN) IN NACL 100-0.9 UT/100ML-% IV SOLN: INTRAVENOUS | Status: DC

## 2024-01-06 MED ORDER — LACTATED RINGERS IV SOLN: INTRAVENOUS | Status: DC

## 2024-01-06 MED ORDER — PROPOFOL 10 MG/ML IV BOLUS
INTRAVENOUS | Status: AC
  Filled 2024-01-06: qty 20

## 2024-01-06 MED ORDER — SODIUM CHLORIDE 0.9% FLUSH: 3.0000 mL | INTRAVENOUS | Status: DC | PRN

## 2024-01-06 MED ORDER — BISACODYL 10 MG RE SUPP: 10.0000 mg | Freq: Every day | RECTAL | Status: DC

## 2024-01-06 MED ORDER — MORPHINE SULFATE (PF) 2 MG/ML IV SOLN
1.0000 mg | INTRAVENOUS | Status: DC | PRN
  Administered 2024-01-06 – 2024-01-07 (×2): 2 mg via INTRAVENOUS
  Administered 2024-01-07: 4 mg via INTRAVENOUS
  Administered 2024-01-07 (×2): 2 mg via INTRAVENOUS
  Administered 2024-01-07 – 2024-01-08 (×3): 4 mg via INTRAVENOUS
  Filled 2024-01-06 (×2): qty 1
  Filled 2024-01-06 (×2): qty 2
  Filled 2024-01-06 (×2): qty 1
  Filled 2024-01-06: qty 2
  Filled 2024-01-06: qty 1
  Filled 2024-01-06: qty 2

## 2024-01-06 MED ORDER — METOPROLOL TARTRATE 25 MG/10 ML ORAL SUSPENSION: 12.5000 mg | Freq: Two times a day (BID) | ORAL | Status: DC

## 2024-01-06 MED ORDER — LACTATED RINGERS IV SOLN: INTRAVENOUS | Status: DC | PRN

## 2024-01-06 MED ORDER — TRAMADOL HCL 50 MG PO TABS
50.0000 mg | ORAL_TABLET | ORAL | Status: DC | PRN
  Administered 2024-01-06: 50 mg via ORAL
  Administered 2024-01-07: 100 mg via ORAL
  Filled 2024-01-06: qty 2
  Filled 2024-01-06: qty 1

## 2024-01-06 MED ORDER — ASPIRIN 81 MG PO CHEW: 324.0000 mg | CHEWABLE_TABLET | Freq: Every day | ORAL | Status: DC

## 2024-01-06 MED ORDER — MIDAZOLAM HCL (PF) 5 MG/ML IJ SOLN
INTRAMUSCULAR | Status: DC | PRN
  Administered 2024-01-06: 2 mg via INTRAVENOUS
  Administered 2024-01-06 (×3): 1 mg via INTRAVENOUS
  Administered 2024-01-06: 2 mg via INTRAVENOUS

## 2024-01-06 MED ORDER — ROCURONIUM BROMIDE 10 MG/ML (PF) SYRINGE
PREFILLED_SYRINGE | INTRAVENOUS | Status: AC
  Filled 2024-01-06: qty 10

## 2024-01-06 MED ORDER — ASPIRIN 81 MG PO CHEW
324.0000 mg | CHEWABLE_TABLET | Freq: Once | ORAL | Status: AC
  Administered 2024-01-06: 324 mg via ORAL
  Filled 2024-01-06: qty 4

## 2024-01-06 MED ORDER — HEMOSTATIC AGENTS (NO CHARGE) OPTIME
TOPICAL | Status: DC | PRN
  Administered 2024-01-06: 1

## 2024-01-06 MED ORDER — ALBUMIN HUMAN 5 % IV SOLN: INTRAVENOUS | Status: DC | PRN

## 2024-01-06 MED ORDER — NITROGLYCERIN IN D5W 200-5 MCG/ML-% IV SOLN: 0.0000 ug/min | INTRAVENOUS | Status: AC

## 2024-01-06 MED ORDER — SODIUM BICARBONATE 8.4 % IV SOLN
50.0000 meq | Freq: Once | INTRAVENOUS | Status: AC
  Administered 2024-01-06: 50 meq via INTRAVENOUS

## 2024-01-06 MED ORDER — PROPOFOL 10 MG/ML IV BOLUS
INTRAVENOUS | Status: DC | PRN
  Administered 2024-01-06: 150 mg via INTRAVENOUS
  Administered 2024-01-06: 50 mg via INTRAVENOUS

## 2024-01-06 MED ORDER — SODIUM CHLORIDE 0.9 % IV SOLN: 250.0000 mL | INTRAVENOUS | Status: DC

## 2024-01-06 MED ORDER — NITROGLYCERIN 0.2 MG/ML ON CALL CATH LAB
INTRAVENOUS | Status: DC | PRN
  Administered 2024-01-06 (×2): 10 ug via INTRAVENOUS

## 2024-01-06 MED ORDER — PLASMA-LYTE A IV SOLN: INTRAVENOUS | Status: DC | PRN

## 2024-01-06 MED ORDER — ONDANSETRON HCL 4 MG/2ML IJ SOLN
4.0000 mg | Freq: Four times a day (QID) | INTRAMUSCULAR | Status: DC | PRN
  Administered 2024-01-07: 4 mg via INTRAVENOUS
  Filled 2024-01-06: qty 2

## 2024-01-06 MED ORDER — SODIUM CHLORIDE 0.9 % IV SOLN
INTRAVENOUS | Status: DC | PRN
Start: 2024-01-06 — End: 2024-01-06

## 2024-01-06 MED ORDER — 0.9 % SODIUM CHLORIDE (POUR BTL) OPTIME
TOPICAL | Status: DC | PRN
  Administered 2024-01-06: 6000 mL

## 2024-01-06 MED ORDER — PANTOPRAZOLE SODIUM 40 MG PO TBEC
40.0000 mg | DELAYED_RELEASE_TABLET | Freq: Every day | ORAL | Status: DC
  Administered 2024-01-08 – 2024-01-10 (×3): 40 mg via ORAL
  Filled 2024-01-06 (×3): qty 1

## 2024-01-06 MED ORDER — PHENYLEPHRINE HCL-NACL 20-0.9 MG/250ML-% IV SOLN: 0.0000 ug/min | INTRAVENOUS | Status: DC

## 2024-01-06 MED ORDER — ACETAMINOPHEN 500 MG PO TABS
1000.0000 mg | ORAL_TABLET | Freq: Four times a day (QID) | ORAL | Status: DC
  Administered 2024-01-06 – 2024-01-10 (×14): 1000 mg via ORAL
  Filled 2024-01-06 (×14): qty 2

## 2024-01-06 MED ORDER — PANTOPRAZOLE SODIUM 40 MG IV SOLR
40.0000 mg | Freq: Every day | INTRAVENOUS | Status: AC
  Administered 2024-01-06 – 2024-01-07 (×2): 40 mg via INTRAVENOUS
  Filled 2024-01-06 (×2): qty 10

## 2024-01-06 MED ORDER — ALBUMIN HUMAN 5 % IV SOLN
250.0000 mL | INTRAVENOUS | Status: DC | PRN
  Administered 2024-01-06: 12.5 g via INTRAVENOUS
  Filled 2024-01-06: qty 250

## 2024-01-06 MED ORDER — BISACODYL 5 MG PO TBEC
10.0000 mg | DELAYED_RELEASE_TABLET | Freq: Every day | ORAL | Status: DC
  Administered 2024-01-07 – 2024-01-09 (×3): 10 mg via ORAL
  Filled 2024-01-06 (×3): qty 2

## 2024-01-06 MED ORDER — ACETAMINOPHEN 160 MG/5ML PO SOLN
650.0000 mg | Freq: Once | ORAL | Status: AC
  Administered 2024-01-06: 650 mg
  Filled 2024-01-06: qty 20.3

## 2024-01-06 MED ORDER — LIDOCAINE 2% (20 MG/ML) 5 ML SYRINGE
INTRAMUSCULAR | Status: AC
  Filled 2024-01-06: qty 5

## 2024-01-06 MED ORDER — SODIUM CHLORIDE 0.9% FLUSH
3.0000 mL | Freq: Two times a day (BID) | INTRAVENOUS | Status: DC
  Administered 2024-01-06 – 2024-01-07 (×2): 10 mL via INTRAVENOUS

## 2024-01-06 SURGICAL SUPPLY — 84 items
ADAPTER MULTI PERFUSION 15 (ADAPTER) ×3 IMPLANT
BAG DECANTER FOR FLEXI CONT (MISCELLANEOUS) ×3 IMPLANT
BLADE CLIPPER SURG (BLADE) ×3 IMPLANT
BLADE STERNUM SYSTEM 6 (BLADE) ×3 IMPLANT
BNDG ELASTIC 4INX 5YD STR LF (GAUZE/BANDAGES/DRESSINGS) IMPLANT
BNDG ELASTIC 6INX 5YD STR LF (GAUZE/BANDAGES/DRESSINGS) ×3 IMPLANT
BNDG GAUZE DERMACEA FLUFF 4 (GAUZE/BANDAGES/DRESSINGS) ×3 IMPLANT
CANISTER SUCTION 3000ML PPV (SUCTIONS) ×3 IMPLANT
CANNULA AORTIC ROOT 9FR (CANNULA) ×3 IMPLANT
CANNULA EZ GLIDE AORTIC 21FR (CANNULA) ×3 IMPLANT
CANNULA MC2 2 STG 36/46 NON-V (CANNULA) IMPLANT
CANNULA VESSEL 3MM BLUNT TIP (CANNULA) ×9 IMPLANT
CATH ROBINSON RED A/P 18FR (CATHETERS) ×3 IMPLANT
CATH THORACIC 36FR (CATHETERS) ×3 IMPLANT
CATH THORACIC 36FR RT ANG (CATHETERS) ×3 IMPLANT
CLAMP ISOLATOR ENCOMPASS RF (ELECTROSURGICAL) IMPLANT
CLIP TI WIDE RED SMALL 24 (CLIP) IMPLANT
CONTAINER PROTECT SURGISLUSH (MISCELLANEOUS) ×6 IMPLANT
DERMABOND ADVANCED .7 DNX12 (GAUZE/BANDAGES/DRESSINGS) IMPLANT
DRAPE EXTREMITY T 121X128X90 (DISPOSABLE) IMPLANT
DRAPE HALF SHEET 40X57 (DRAPES) IMPLANT
DRAPE SRG 135X102X78XABS (DRAPES) ×3 IMPLANT
DRAPE WARM FLUID 44X44 (DRAPES) ×3 IMPLANT
DRSG COVADERM 4X14 (GAUZE/BANDAGES/DRESSINGS) ×3 IMPLANT
ELECTRODE BLDE 4.0 EZ CLN MEGD (MISCELLANEOUS) IMPLANT
ELECTRODE REM PT RTRN 9FT ADLT (ELECTROSURGICAL) ×6 IMPLANT
FELT TEFLON 1X6 (MISCELLANEOUS) ×6 IMPLANT
GAUZE 4X4 16PLY ~~LOC~~+RFID DBL (SPONGE) ×3 IMPLANT
GAUZE SPONGE 4X4 12PLY STRL (GAUZE/BANDAGES/DRESSINGS) ×6 IMPLANT
GLOVE SS BIOGEL STRL SZ 7.5 (GLOVE) ×3 IMPLANT
GOWN STRL REUS W/ TWL LRG LVL3 (GOWN DISPOSABLE) ×12 IMPLANT
GOWN STRL REUS W/ TWL XL LVL3 (GOWN DISPOSABLE) ×6 IMPLANT
HEMOSTAT POWDER SURGIFOAM 1G (HEMOSTASIS) ×9 IMPLANT
HEMOSTAT SURGICEL 2X14 (HEMOSTASIS) ×3 IMPLANT
INSERT FOGARTY XLG (MISCELLANEOUS) IMPLANT
KIT BASIN OR (CUSTOM PROCEDURE TRAY) ×3 IMPLANT
KIT SUCTION CATH 14FR (SUCTIONS) ×6 IMPLANT
KIT TURNOVER KIT B (KITS) ×3 IMPLANT
KIT VASOVIEW HEMOPRO 2 VH 4000 (KITS) ×3 IMPLANT
MARKER GRAFT CORONARY BYPASS (MISCELLANEOUS) ×9 IMPLANT
NS IRRIG 1000ML POUR BTL (IV SOLUTION) ×15 IMPLANT
PACK E OPEN HEART (SUTURE) ×3 IMPLANT
PACK OPEN HEART (CUSTOM PROCEDURE TRAY) ×3 IMPLANT
PAD ARMBOARD POSITIONER FOAM (MISCELLANEOUS) ×6 IMPLANT
PAD ELECT DEFIB RADIOL ZOLL (MISCELLANEOUS) ×3 IMPLANT
PENCIL BUTTON HOLSTER BLD 10FT (ELECTRODE) ×3 IMPLANT
POSITIONER HEAD DONUT 9IN (MISCELLANEOUS) ×3 IMPLANT
PROBE CRYO2-ABLATION MALLABLE (MISCELLANEOUS) IMPLANT
PUNCH AORTIC ROTATE 4.0MM (MISCELLANEOUS) IMPLANT
PUNCH AORTIC ROTATE 4.5MM 8IN (MISCELLANEOUS) IMPLANT
PUNCH AORTIC ROTATE 5MM 8IN (MISCELLANEOUS) IMPLANT
SET MPS 3-ND DEL (MISCELLANEOUS) IMPLANT
SOLUTION ANTFG W/FOAM PAD STRL (MISCELLANEOUS) IMPLANT
SPONGE LAP 18X18 X RAY DECT (DISPOSABLE) IMPLANT
SPONGE T-LAP 18X18 ~~LOC~~+RFID (SPONGE) ×12 IMPLANT
SPONGE T-LAP 4X18 ~~LOC~~+RFID (SPONGE) ×3 IMPLANT
SUPPORT HEART JANKE-BARRON (MISCELLANEOUS) ×3 IMPLANT
SUT BONE WAX W31G (SUTURE) ×3 IMPLANT
SUT MNCRL AB 4-0 PS2 18 (SUTURE) IMPLANT
SUT PROLENE 3 0 SH DA (SUTURE) IMPLANT
SUT PROLENE 4 0 SH DA (SUTURE) IMPLANT
SUT PROLENE 4-0 RB1 .5 CRCL 36 (SUTURE) IMPLANT
SUT PROLENE 6 0 C 1 30 (SUTURE) ×6 IMPLANT
SUT PROLENE 7 0 BV1 MDA (SUTURE) ×3 IMPLANT
SUT PROLENE 8 0 BV175 6 (SUTURE) IMPLANT
SUT STEEL 6MS V (SUTURE) ×3 IMPLANT
SUT STEEL STERNAL CCS#1 18IN (SUTURE) IMPLANT
SUT STEEL SZ 6 DBL 3X14 BALL (SUTURE) ×3 IMPLANT
SUT VIC AB 1 CTX36XBRD ANBCTR (SUTURE) ×6 IMPLANT
SUT VIC AB 2-0 CT1 TAPERPNT 27 (SUTURE) IMPLANT
SUT VIC AB 2-0 CTX 27 (SUTURE) IMPLANT
SUT VIC AB 3-0 SH 27X BRD (SUTURE) IMPLANT
SYSTEM CLIP PENDITURE LAAC40 (Clip) IMPLANT
SYSTEM SAHARA CHEST DRAIN ATS (WOUND CARE) ×3 IMPLANT
TAPE CLOTH SURG 4X10 WHT LF (GAUZE/BANDAGES/DRESSINGS) IMPLANT
TAPE PAPER 2X10 WHT MICROPORE (GAUZE/BANDAGES/DRESSINGS) IMPLANT
TOWEL GREEN STERILE (TOWEL DISPOSABLE) ×3 IMPLANT
TOWEL GREEN STERILE FF (TOWEL DISPOSABLE) ×3 IMPLANT
TRAY FOLEY SLVR 16FR TEMP STAT (SET/KITS/TRAYS/PACK) ×3 IMPLANT
TUBE SUCT INTRACARD DLP 20F (MISCELLANEOUS) ×3 IMPLANT
TUBE SUCTION CARDIAC 10FR (CANNULA) ×3 IMPLANT
TUBING LAP HI FLOW INSUFFLATIO (TUBING) ×3 IMPLANT
UNDERPAD 30X36 HEAVY ABSORB (UNDERPADS AND DIAPERS) ×3 IMPLANT
WATER STERILE IRR 1000ML POUR (IV SOLUTION) ×6 IMPLANT

## 2024-01-06 NOTE — Transfer of Care (Signed)
 Immediate Anesthesia Transfer of Care Note  Patient: Daniel Holden  Procedure(s) Performed: CORONARY ARTERY BYPASS GRAFTING X 3, USING LEFT INTERNAL MAMMARY ARTERY, HARVESTED LEFT RADIAL ARTERY AND ENDOSCOPIC HARVESTED RIGHT GREATER SAPHENOUS VEIN (Chest) COX MAZE PROCEDURE CLIPPING, LEFT ATRIAL APPENDAGE ECHOCARDIOGRAM, TRANSESOPHAGEAL, INTRAOPERATIVE SURGICAL PROCUREMENT, ARTERY, RADIAL  Patient Location: ICU  Anesthesia Type:General  Level of Consciousness: sedated and Patient remains intubated per anesthesia plan  Airway & Oxygen Therapy: Patient remains intubated per anesthesia plan and Patient placed on Ventilator (see vital sign flow sheet for setting)  Post-op Assessment: Report given to RN and Post -op Vital signs reviewed and stable  Post vital signs: Reviewed and stable  Last Vitals:  Vitals Value Taken Time  BP 81/54   Temp    Pulse 79 01/06/24 16:15  Resp 16 01/06/24 16:15  SpO2 96 % 01/06/24 16:15  Vitals shown include unfiled device data.  Last Pain:  Vitals:   01/06/24 0958  TempSrc:   PainSc: 0-No pain         Complications: No notable events documented.  Patient transported to ICU with standard monitors (HR, BP, SPO2, RR) and emergency drugs/equipment. Controlled ventilation maintained via ambu bag. Report given to bedside RN and respiratory therapist. Pt connected to ICU monitor and ventilator. All questions answered and vital signs stable before leaving

## 2024-01-06 NOTE — Anesthesia Preprocedure Evaluation (Signed)
 Anesthesia Evaluation  Patient identified by MRN, date of birth, ID band Patient awake    Reviewed: Allergy & Precautions, H&P , NPO status , Patient's Chart, lab work & pertinent test results  Airway Mallampati: II   Neck ROM: full    Dental   Pulmonary sleep apnea    breath sounds clear to auscultation       Cardiovascular hypertension, + CAD and + Past MI  + dysrhythmias Atrial Fibrillation  Rhythm:regular Rate:Normal     Neuro/Psych  PSYCHIATRIC DISORDERS Anxiety        GI/Hepatic ,GERD  ,,  Endo/Other  diabetes, Type 2    Renal/GU      Musculoskeletal   Abdominal   Peds  Hematology   Anesthesia Other Findings   Reproductive/Obstetrics                              Anesthesia Physical Anesthesia Plan  ASA: 3  Anesthesia Plan: General   Post-op Pain Management:    Induction: Intravenous  PONV Risk Score and Plan: 2 and Ondansetron , Dexamethasone , Midazolam  and Treatment may vary due to age or medical condition  Airway Management Planned: Oral ETT  Additional Equipment: Arterial line, CVP, PA Cath, TEE and Ultrasound Guidance Line Placement  Intra-op Plan:   Post-operative Plan: Post-operative intubation/ventilation  Informed Consent: I have reviewed the patients History and Physical, chart, labs and discussed the procedure including the risks, benefits and alternatives for the proposed anesthesia with the patient or authorized representative who has indicated his/her understanding and acceptance.     Dental advisory given  Plan Discussed with: CRNA, Anesthesiologist and Surgeon  Anesthesia Plan Comments:         Anesthesia Quick Evaluation

## 2024-01-06 NOTE — Brief Op Note (Signed)
 01/03/2024 - 01/06/2024  9:51 AM  PATIENT:  Daniel Holden  78 y.o. male  PRE-OPERATIVE DIAGNOSIS:  CAD AFIB  POST-OPERATIVE DIAGNOSIS:  CADAFIB  PROCEDURE:  Procedure(s): CORONARY ARTERY BYPASS GRAFTING X 3, USING LEFT INTERNAL MAMMARY ARTERY, HARVESTED LEFT RADIAL ARTERY AND ENDOSCOPIC HARVESTED RIGHT GREATER SAPHENOUS VEIN (N/A) COX MAZE PROCEDURE (N/A) CLIPPING, LEFT ATRIAL APPENDAGE (N/A) ECHOCARDIOGRAM, TRANSESOPHAGEAL, INTRAOPERATIVE (N/A) SURGICAL PROCUREMENT, ARTERY, RADIAL Vein harvest time: Vein prep time:  SURGEON:  Surgeons and Role:    * Kerrin Elspeth BROCKS, MD - Primary  PHYSICIAN ASSISTANT: Asia Favata PA-C  ASSISTANTS: JEREL FEES RNFA   ANESTHESIA:   general  EBL:  1000 mL   BLOOD ADMINISTERED:none  DRAINS: LEFT PLEURAL AND MEDIASTINAL CHEST TUBES   LOCAL MEDICATIONS USED:  NONE  SPECIMEN:  No Specimen  DISPOSITION OF SPECIMEN:  N/A  COUNTS:  YES  TOURNIQUET:  * No tourniquets in log *  DICTATION: .Other Dictation: Dictation Number PENDING  PLAN OF CARE: Admit to inpatient   PATIENT DISPOSITION:  ICU - intubated and hemodynamically stable.   Delay start of Pharmacological VTE agent (>24hrs) due to surgical blood loss or risk of bleeding: yes  COMPLICATIONS: NO KNOWN

## 2024-01-06 NOTE — Anesthesia Procedure Notes (Signed)
 Procedure Name: Intubation Date/Time: 01/06/2024 11:31 AM  Performed by: Jama Powell NOVAK, CRNAPre-anesthesia Checklist: Patient identified, Emergency Drugs available, Suction available and Patient being monitored Patient Re-evaluated:Patient Re-evaluated prior to induction Oxygen Delivery Method: Circle System Utilized Preoxygenation: Pre-oxygenation with 100% oxygen Induction Type: IV induction Ventilation: Mask ventilation without difficulty Laryngoscope Size: Miller and 3 Grade View: Grade I Tube type: Oral Tube size: 8.0 mm Number of attempts: 1 Airway Equipment and Method: Stylet and Oral airway Placement Confirmation: ETT inserted through vocal cords under direct vision, positive ETCO2 and breath sounds checked- equal and bilateral Secured at: 23 cm Tube secured with: Tape Dental Injury: Teeth and Oropharynx as per pre-operative assessment

## 2024-01-06 NOTE — Progress Notes (Signed)
  Progress Note  Patient Name: Daniel Holden Date of Encounter: 01/06/2024 [name] Cardiologist: Wilbert Bihari, MD   Interval Summary    Planned for CABG today, no complaints.   Vital Signs Vitals:   01/05/24 1717 01/05/24 2023 01/05/24 2103 01/06/24 0550  BP: 114/64 117/61 117/61 113/63  Pulse: (!) 59 61 61 (!) 57  Resp:  18  18  Temp: 97.8 F (36.6 C) 98.1 F (36.7 C)  97.6 F (36.4 C)  TempSrc: Oral Oral  Oral  SpO2:    100%  Weight:      Height:        Intake/Output Summary (Last 24 hours) at 01/06/2024 0754 Last data filed at 01/06/2024 0347 Gross per 24 hour  Intake 787.69 ml  Output --  Net 787.69 ml      01/03/2024    4:15 PM 01/02/2024    2:07 PM 01/01/2024   10:06 PM  Last 3 Weights  Weight (lbs) 214 lb 214 lb 220 lb  Weight (kg) 97.07 kg 97.07 kg 99.791 kg      Telemetry/ECG   Sinus Rhythm - Personally Reviewed  Physical Exam  GEN: No acute distress.   Neck: No JVD Cardiac: RRR, no murmurs, rubs, or gallops.  Respiratory: Clear to auscultation bilaterally. GI: Soft, nontender, non-distended  MS: No edema  Assessment & Plan   78 y.o. male with a history of CAD, hypertension, hepatic steatosis, hyperlipidemia, aortic atherosclerosis, anxiety, cardiac valve disease who was seen for the conitnued evaluation of NSTEMI and new onset atrial fibrillation. Patient underwent LHC on 7/10 which reveal severe multivessel CAD. He is pending transfer to [name] for CABG evaluation.    NSTEMI Hx of CAD -- presented to [name] with unstable angina and elevated troponins peaking at 5240. -- Underwent cardiac catheterization with severe multivessel CAD, known occlusion of RCA with progression of pLAD/Lcx of 95% in LAD and 90% in circumflex.  Transferred to [name] for further evaluation with CT surgery for CABG.  Seen by Dr. Kerrin and plan for CABG today -- Echocardiogram showed LVEF of 60 to 65%, no regional wall , grade 1 diastolic dysfunction --  Continue aspirin , metoprolol  50 mg twice daily, Imdur  30 mg, atorvastatin  80 mg daily, IV heparin   New onset atrial fibrillation -- Initially presented in new onset atrial fibrillation but converted to sinus rhythm the morning of 7/10 -- Continue metoprolol  50 mg twice daily, IV heparin  with plans for DOAC post CABG  Hypertension -- Well-controlled -- Continue Imdur  30 mg daily, metoprolol  50 mg twice daily  Hyperlipidemia -- LDL 33 -- Continue atorvastatin  80 mg daily   For questions or updates, please contact [name] Please consult [email or web address] for contact info under       Signed, Manuelita Rummer, NP

## 2024-01-06 NOTE — Progress Notes (Signed)
 PHARMACY - ANTICOAGULATION CONSULT NOTE  Pharmacy Consult for heparin  dosage adjustment  Indication: chest pain/ACS and new onset atrial fibrillation.   Allergies  Allergen Reactions   Metronidazole  Other (See Comments)    Patient stated that he had a moderate level of pain with his allergic reaction.    Ace Inhibitors Cough   Dilaudid  [Hydromorphone  Hcl] Nausea And Vomiting    Patient Measurements: Height: 5' 6 (167.6 cm) Weight: 97.1 kg (214 lb) IBW/kg (Calculated) : 63.8 HEPARIN  DW (KG): 84.9  Vital Signs: Temp: 97.6 F (36.4 C) (07/14 0550) Temp Source: Oral (07/14 0550) BP: 113/63 (07/14 0550) Pulse Rate: 57 (07/14 0550)  Labs: Recent Labs    01/04/24 0446 01/05/24 0508 01/06/24 0521  HGB 10.8* 10.6* 11.8*  HCT 30.2* 29.5* 34.0*  PLT 157 150 181  APTT  --  121*  --   LABPROT  --  15.2  --   INR  --  1.1  --   HEPARINUNFRC 0.60 0.55 0.43  CREATININE  --   --  0.93    Estimated Creatinine Clearance: 74.8 mL/min (by C-G formula based on SCr of 0.93 mg/dL).   Medical History: Past Medical History:  Diagnosis Date   Allergy    Angina of effort (HCC)    Anxiety    CAD (coronary artery disease), native coronary artery    coronary CTA showing aortic atherosclerosis with very high coronary Ca score at 4630 with severely calcified RCA and LAD > 70% and 50-69% prox to mid LCx.     Cancer (HCC)    Carpal tunnel syndrome    GERD (gastroesophageal reflux disease)    HLD (hyperlipidemia)    Hypertension    Lung nodule    Obesity    Pancreatitis 2015   Sleep apnea      Assessment: 78 year old male presenting with NSTEMI and new onset AFib transferred to [name] for CABG scheduled for 7/14. Notable PMH OSA, HTN, T2DM, CAD, hepatic steatosis. CHAD-VASC score of 4. Last CBC mostly stable with slight downtrend in Hgb.   Heparin  level 0.43 is therapeutic but down trending on 1200 units/hr. CBC stable. Planning CABG 7/14.   Goal of Therapy:  Heparin  level  0.3-0.7 units/ml Monitor platelets by anticoagulation protocol: Yes   Plan:  Continue heparin  1200 units/hr  Monitor daily heparin  level, CBC, signs/symptoms of bleeding    Jinnie Door, PharmD, BCPS, BCCP Clinical Pharmacist  Please check AMION for all [facility] Pharmacy phone numbers After 10:00 PM, call Main Pharmacy [phone]

## 2024-01-06 NOTE — Hospital Course (Signed)
 Referring Physician: Dr. Anner- cath, Dr. Shlomo primary Cards   HPI: At time of CT surgical consultation   c/o of CP   Daniel Holden is a 78 yo man with a known history of CAD and chronic stable angina.  History of hypertension, hyperlipidemia, sleep apnea, pancreatitis, reflux.  Presented with a 3 day history of accelerating CP.  Increased frequency and severity. Presented after prolonged episode on Wednesday lasting over 2 hours.  Also noted irregular HR.  Was found to be in new onset atrial fib with RVR.  Initial troponin 125, increased to  5240.    Underwent cath- CTO of RCA, 95% LAD and 90% circumflex stenoses.  Bifurcated LAD with proximal septal perforator branch and larger anterior apical branch.  Echo showed normal EF with no significant valvular pathology.  The patient and all relevant studies were reviewed by Dr. Kerrin who recommended proceeding with CABG.  Hospital course:  The patient underwent full diagnostic evaluation and was felt to be stable to proceed and on 01/06/2024 he was taken to the operating room at which time he underwent CABG x 3.  A LIMA-LAD, SVG-PL and a left radial-OM were placed.  He tolerated the procedure well and was taken to the surgical intensive care unit in stable condition.  Postoperative hospital course:  The patient was extubated using standard post cardiac surgical protocols without difficulty.  He has remained hemodynamically stable and normal sinus rhythm.  Swan-Ganz and arterial line were removed on postop day #1.  Plan is to initiate aspirin  and add Plavix  after pacing wires are removed.  He has been started on Imdur  for radial artery harvest.  He is also on beta-blocker and statin.  He is tolerating routine activity progression using standard post cardiac surgical protocols.  He is undergoing usual pulmonary hygiene maneuvers.  Blood sugars were managed in a routine manner.  Diet was advanced as tolerated.  He does have a Expected acute blood  loss anemia which is being monitored clinically and not into the transfusion threshold.  He is undergoing a routine diuresis.

## 2024-01-06 NOTE — Anesthesia Procedure Notes (Signed)
 Arterial Line Insertion Start/End7/14/2025 10:30 AM, 01/06/2024 10:40 AM Performed by: Jama Powell NOVAK, CRNA, CRNA  Patient location: Pre-op. Preanesthetic checklist: patient identified, IV checked, site marked, risks and benefits discussed, surgical consent, monitors and equipment checked, pre-op evaluation, timeout performed and anesthesia consent Lidocaine  1% used for infiltration and patient sedated Right, radial was placed Hand hygiene performed  and maximum sterile barriers used   Attempts: 3 Procedure performed without using ultrasound guided technique. Ultrasound Notes:anatomy identified, needle tip was noted to be adjacent to the nerve/plexus identified and no ultrasound evidence of intravascular and/or intraneural injection Following insertion, Biopatch and dressing applied. Post procedure assessment: normal  Post procedure complications: second provider assisted. Patient tolerated the procedure well with no immediate complications.

## 2024-01-06 NOTE — Anesthesia Procedure Notes (Signed)
 Central Venous Catheter Insertion Performed by: Maryclare Cornet, MD, anesthesiologist Start/End7/14/2025 10:51 AM, 01/06/2024 10:52 AM Patient location: Pre-op. Preanesthetic checklist: patient identified, IV checked, site marked, risks and benefits discussed, surgical consent, monitors and equipment checked, pre-op evaluation, timeout performed and anesthesia consent Hand hygiene performed  and maximum sterile barriers used  PA cath was placed.Swan type:thermodilution Procedure performed without using ultrasound guided technique. Attempts: 1 Patient tolerated the procedure well with no immediate complications.

## 2024-01-06 NOTE — Procedures (Signed)
 Extubation Procedure Note  Patient Details:   Name: CHAYNE BAUMGART DOB: 11-18-1945 MRN: [id number]   Airway Documentation:    Vent end date: 01/06/24 Vent end time: 2115   Evaluation  O2 sats: stable throughout Complications: No apparent complications Patient did tolerate procedure well. Bilateral Breath Sounds: Diminished, Clear   Yes  Patient extubated per rapid wean protocol. NIF -25, VC 1.1L with cuff leak prior to extubation. Patient placed on 4L [place] post extubation with no stridor noted. RN at bedside throughout extubation.   Laurell KANDICE Rodriguez 01/06/2024, 9:19 PM

## 2024-01-06 NOTE — Interval H&P Note (Signed)
 History and Physical Interval Note:  01/06/2024 7:37 AM  Daniel Holden  has presented today for surgery, with the diagnosis of CAD AFIB.  The various methods of treatment have been discussed with the patient and family. After consideration of risks, benefits and other options for treatment, the patient has consented to  Procedure(s): CORONARY ARTERY BYPASS GRAFTING (CABG) (N/A) COX MAZE PROCEDURE (N/A) CLIPPING, LEFT ATRIAL APPENDAGE (N/A) ECHOCARDIOGRAM, TRANSESOPHAGEAL, INTRAOPERATIVE (N/A) as a surgical intervention.  The patient's history has been reviewed, patient examined, no change in status, stable for surgery.  I have reviewed the patient's chart and labs.  Questions were answered to the patient's satisfaction.     Elspeth JAYSON Millers

## 2024-01-06 NOTE — Anesthesia Procedure Notes (Signed)
 Central Venous Catheter Insertion Performed by: Maryclare Cornet, MD, anesthesiologist Start/End7/14/2025 10:40 AM, 01/06/2024 10:51 AM Patient location: Pre-op. Preanesthetic checklist: patient identified, IV checked, site marked, risks and benefits discussed, surgical consent, monitors and equipment checked, pre-op evaluation, timeout performed and anesthesia consent Lidocaine  1% used for infiltration and patient sedated Hand hygiene performed  and maximum sterile barriers used  Catheter size: 9 Fr Sheath introducer Procedure performed using ultrasound guided technique. Ultrasound Notes:anatomy identified, needle tip was noted to be adjacent to the nerve/plexus identified, no ultrasound evidence of intravascular and/or intraneural injection and image(s) printed for medical record Attempts: 1 Following insertion, line sutured and dressing applied. Post procedure assessment: blood return through all ports, free fluid flow and no air  Patient tolerated the procedure well with no immediate complications.

## 2024-01-07 ENCOUNTER — Inpatient Hospital Stay (HOSPITAL_COMMUNITY)

## 2024-01-07 ENCOUNTER — Encounter (HOSPITAL_COMMUNITY): Payer: Self-pay | Admitting: Thoracic Surgery (Cardiothoracic Vascular Surgery)

## 2024-01-07 DIAGNOSIS — Z951 Presence of aortocoronary bypass graft: Secondary | ICD-10-CM

## 2024-01-07 DIAGNOSIS — Z9889 Other specified postprocedural states: Secondary | ICD-10-CM

## 2024-01-07 LAB — CBC
HCT: 24.2 % — ABNORMAL LOW (ref 39.0–52.0)
HCT: 23.2 % — ABNORMAL LOW (ref 39.0–52.0)
Hemoglobin: 8.5 g/dL — ABNORMAL LOW (ref 13.0–17.0)
Hemoglobin: 8.2 g/dL — ABNORMAL LOW (ref 13.0–17.0)
MCH: 37.8 pg — ABNORMAL HIGH (ref 26.0–34.0)
MCH: 38 pg — ABNORMAL HIGH (ref 26.0–34.0)
MCHC: 35.1 g/dL (ref 30.0–36.0)
MCHC: 35.3 g/dL (ref 30.0–36.0)
MCV: 107.6 fL — ABNORMAL HIGH (ref 80.0–100.0)
MCV: 107.4 fL — ABNORMAL HIGH (ref 80.0–100.0)
Platelets: 121 K/uL — ABNORMAL LOW (ref 150–400)
Platelets: 149 K/uL — ABNORMAL LOW (ref 150–400)
RBC: 2.25 MIL/uL — ABNORMAL LOW (ref 4.22–5.81)
RBC: 2.16 MIL/uL — ABNORMAL LOW (ref 4.22–5.81)
RDW: 11.9 % (ref 11.5–15.5)
RDW: 12 % (ref 11.5–15.5)
WBC: 7.8 K/uL (ref 4.0–10.5)
WBC: 8.8 K/uL (ref 4.0–10.5)
nRBC: 0 % (ref 0.0–0.2)
nRBC: 0 % (ref 0.0–0.2)

## 2024-01-07 LAB — GLUCOSE, CAPILLARY
Glucose-Capillary: 106 mg/dL — ABNORMAL HIGH (ref 70–99)
Glucose-Capillary: 119 mg/dL — ABNORMAL HIGH (ref 70–99)
Glucose-Capillary: 123 mg/dL — ABNORMAL HIGH (ref 70–99)
Glucose-Capillary: 134 mg/dL — ABNORMAL HIGH (ref 70–99)
Glucose-Capillary: 140 mg/dL — ABNORMAL HIGH (ref 70–99)
Glucose-Capillary: 146 mg/dL — ABNORMAL HIGH (ref 70–99)
Glucose-Capillary: 148 mg/dL — ABNORMAL HIGH (ref 70–99)
Glucose-Capillary: 150 mg/dL — ABNORMAL HIGH (ref 70–99)
Glucose-Capillary: 151 mg/dL — ABNORMAL HIGH (ref 70–99)
Glucose-Capillary: 153 mg/dL — ABNORMAL HIGH (ref 70–99)
Glucose-Capillary: 154 mg/dL — ABNORMAL HIGH (ref 70–99)
Glucose-Capillary: 171 mg/dL — ABNORMAL HIGH (ref 70–99)
Glucose-Capillary: 181 mg/dL — ABNORMAL HIGH (ref 70–99)
Glucose-Capillary: 184 mg/dL — ABNORMAL HIGH (ref 70–99)

## 2024-01-07 LAB — BASIC METABOLIC PANEL WITH GFR
Anion gap: 9 (ref 5–15)
Anion gap: 9 (ref 5–15)
BUN: 11 mg/dL (ref 8–23)
BUN: 9 mg/dL (ref 8–23)
CO2: 20 mmol/L — ABNORMAL LOW (ref 22–32)
CO2: 20 mmol/L — ABNORMAL LOW (ref 22–32)
Calcium: 7.6 mg/dL — ABNORMAL LOW (ref 8.9–10.3)
Calcium: 7.4 mg/dL — ABNORMAL LOW (ref 8.9–10.3)
Chloride: 106 mmol/L (ref 98–111)
Chloride: 105 mmol/L (ref 98–111)
Creatinine, Ser: 0.82 mg/dL (ref 0.61–1.24)
Creatinine, Ser: 0.8 mg/dL (ref 0.61–1.24)
GFR, Estimated: >60 mL/min (ref >60)
GFR, Estimated: >60 mL/min (ref >60)
Glucose, Bld: 126 mg/dL — ABNORMAL HIGH (ref 70–99)
Glucose, Bld: 104 mg/dL — ABNORMAL HIGH (ref 70–99)
Potassium: 4 mmol/L (ref 3.5–5.1)
Potassium: 4 mmol/L (ref 3.5–5.1)
Sodium: 135 mmol/L (ref 135–145)
Sodium: 134 mmol/L — ABNORMAL LOW (ref 135–145)

## 2024-01-07 LAB — MAGNESIUM
Magnesium: 2.4 mg/dL (ref 1.7–2.4)
Magnesium: 2.1 mg/dL (ref 1.7–2.4)

## 2024-01-07 MED ORDER — FUROSEMIDE 10 MG/ML IJ SOLN
20.0000 mg | Freq: Once | INTRAMUSCULAR | Status: AC
  Administered 2024-01-07: 20 mg via INTRAVENOUS
  Filled 2024-01-07: qty 2

## 2024-01-07 MED ORDER — CARMEX CLASSIC LIP BALM EX OINT
TOPICAL_OINTMENT | CUTANEOUS | Status: DC | PRN
  Filled 2024-01-07: qty 10

## 2024-01-07 MED ORDER — INSULIN GLARGINE-YFGN 100 UNIT/ML ~~LOC~~ SOLN
20.0000 [IU] | Freq: Two times a day (BID) | SUBCUTANEOUS | Status: DC
  Administered 2024-01-07 (×2): 20 [IU] via SUBCUTANEOUS
  Filled 2024-01-07 (×4): qty 0.2

## 2024-01-07 MED ORDER — ORAL CARE MOUTH RINSE
15.0000 mL | OROMUCOSAL | Status: DC | PRN
Start: 2024-01-07 — End: 2024-01-10

## 2024-01-07 MED ORDER — INSULIN ASPART 100 UNIT/ML IJ SOLN
0.0000 [IU] | INTRAMUSCULAR | Status: DC
  Administered 2024-01-07: 2 [IU] via SUBCUTANEOUS
  Administered 2024-01-07 (×2): 4 [IU] via SUBCUTANEOUS
  Administered 2024-01-08 (×3): 2 [IU] via SUBCUTANEOUS

## 2024-01-07 MED ORDER — ISOSORBIDE MONONITRATE ER 30 MG PO TB24
30.0000 mg | ORAL_TABLET | Freq: Every day | ORAL | Status: DC
  Administered 2024-01-07 – 2024-01-08 (×2): 30 mg via ORAL
  Filled 2024-01-07 (×2): qty 1

## 2024-01-07 MED ORDER — MELATONIN 5 MG PO TABS
10.0000 mg | ORAL_TABLET | Freq: Every day | ORAL | Status: DC
  Administered 2024-01-07 – 2024-01-09 (×3): 10 mg via ORAL
  Filled 2024-01-07 (×3): qty 2

## 2024-01-07 MED ORDER — ENOXAPARIN SODIUM 40 MG/0.4ML IJ SOSY
40.0000 mg | PREFILLED_SYRINGE | Freq: Every day | INTRAMUSCULAR | Status: DC
  Administered 2024-01-07 – 2024-01-09 (×3): 40 mg via SUBCUTANEOUS
  Filled 2024-01-07 (×3): qty 0.4

## 2024-01-07 MED ORDER — POTASSIUM CHLORIDE 10 MEQ/50ML IV SOLN
10.0000 meq | INTRAVENOUS | Status: AC
  Administered 2024-01-07 (×2): 10 meq via INTRAVENOUS
  Filled 2024-01-07 (×2): qty 50

## 2024-01-07 NOTE — Progress Notes (Signed)
 1 Day Post-Op Procedure(s) (LRB): CORONARY ARTERY BYPASS GRAFTING X 3, USING LEFT INTERNAL MAMMARY ARTERY, HARVESTED LEFT RADIAL ARTERY AND ENDOSCOPIC HARVESTED RIGHT GREATER SAPHENOUS VEIN (N/A) COX MAZE PROCEDURE (N/A) CLIPPING, LEFT ATRIAL APPENDAGE (N/A) ECHOCARDIOGRAM, TRANSESOPHAGEAL, INTRAOPERATIVE (N/A) SURGICAL PROCUREMENT, ARTERY, RADIAL Subjective: Some pain left arm incision, no nausea  Objective: Vital signs in last 24 hours: Temp:  [95.4 F (35.2 C)-99.9 F (37.7 C)] 99.5 F (37.5 C) (07/15 0700) Pulse Rate:  [52-83] 80 (07/15 0700) Cardiac Rhythm: A-V Sequential paced (07/15 0000) Resp:  [10-27] 20 (07/15 0700) BP: (141-163)/(67-72) 143/69 (07/14 1105) SpO2:  [91 %-100 %] 91 % (07/15 0700) Arterial Line BP: (90-246)/(-9-78) 152/58 (07/15 0700) FiO2 (%):  [40 %-50 %] 40 % (07/14 2025) Weight:  [97.5 kg-100.1 kg] 100.1 kg (07/15 0500)  Hemodynamic parameters for last 24 hours: PAP: (18-46)/(3-24) 46/20 CVP:  [3 mmHg-22 mmHg] 22 mmHg CO:  [3.4 L/min-5.7 L/min] 5.7 L/min CI:  [1.7 L/min/m2-2.79 L/min/m2] 2.79 L/min/m2  Intake/Output from previous day: 07/14 0701 - 07/15 0700 In: 4345.2 [I.V.:2051.8; Blood:504; IV Piggyback:1789.4] Out: 4966 [Urine:3310; Blood:1000; Chest Tube:656] Intake/Output this shift: No intake/output data recorded.  General appearance: alert, cooperative, and no distress Neurologic: intact Heart: regular rate and rhythm Lungs: diminished breath sounds bibasilar Abdomen: normal findings: soft, non-tender  Lab Results: Recent Labs    01/06/24 2230 01/06/24 2231 01/07/24 0450  WBC 7.8  --  8.8  HGB 8.5* 7.8* 8.2*  HCT 24.2* 23.0* 23.2*  PLT 121*  --  149*   BMET:  Recent Labs    01/06/24 2230 01/06/24 2231 01/07/24 0450  NA 135 139 134*  K 4.0 4.1 4.0  CL 106  --  105  CO2 20*  --  20*  GLUCOSE 126*  --  104*  BUN 11  --  9  CREATININE 0.82  --  0.80  CALCIUM  7.6*  --  7.4*    PT/INR:  Recent Labs    01/06/24 1623   LABPROT 19.7*  INR 1.6*   ABG    Component Value Date/Time   PHART 7.410 01/06/2024 2231   HCO3 20.3 01/06/2024 2231   TCO2 21 (L) 01/06/2024 2231   ACIDBASEDEF 4.0 (H) 01/06/2024 2231   O2SAT 98 01/06/2024 2231   CBG (last 3)  Recent Labs    01/07/24 0307 01/07/24 0451 01/07/24 0553  GLUCAP 119* 106* 150*    Assessment/Plan: S/P Procedure(s) (LRB): CORONARY ARTERY BYPASS GRAFTING X 3, USING LEFT INTERNAL MAMMARY ARTERY, HARVESTED LEFT RADIAL ARTERY AND ENDOSCOPIC HARVESTED RIGHT GREATER SAPHENOUS VEIN (N/A) COX MAZE PROCEDURE (N/A) CLIPPING, LEFT ATRIAL APPENDAGE (N/A) ECHOCARDIOGRAM, TRANSESOPHAGEAL, INTRAOPERATIVE (N/A) SURGICAL PROCUREMENT, ARTERY, RADIAL POD # 1 looks great NEURO- intact CV- in Sr with good hemodynamics  ASA, add Plavix  after pacing wires out  Dc Swan and A line  Imdur  for radial graft  Beta blocker, atorvastatin  RESP- IS for atelectasis RENAL- creatinine and lytes OK  Will give IV Lasix  this AM ENDO- CBG well controlled  Transtition from insulin  drip to SemGlee  + SSI GI- diet as tolerated Anemia secondary to ABL- monitor SCD + enoxaparin  Dc chest tubes Cardiac rehab    LOS: 4 days    Daniel Holden 01/07/2024

## 2024-01-07 NOTE — Discharge Summary (Signed)
 [address] [place] [address]             [phone]        Physician Discharge Summary  Patient ID: Daniel Holden MRN: [id number] DOB/AGE: 76/11/1945 76 y.o.  Admit date: 01/03/2024 Discharge date: 01/10/2024  Admission Diagnoses:  Patient Active Problem List   Diagnosis Date Noted   S/P CABG x 3 01/06/2024   NSTEMI (non-ST elevated myocardial infarction) (HCC) 01/02/2024   Essential hypertension 01/02/2024   Dyslipidemia 01/02/2024   Type 2 diabetes mellitus without complications (HCC) 01/02/2024   GERD without esophagitis 01/02/2024   CAD (coronary artery disease) 01/02/2024   Unstable angina (HCC) 01/02/2024   Atrial fibrillation, new onset (HCC) 01/01/2024   Maxillary sinusitis 03/20/2021   Periorbital cellulitis 03/20/2021   Hypotension due to hypovolemia 01/23/2021   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 intake 01/11/2021   Hepatic steatosis 10/25/2020   Abnormal cardiac CT angiography 10/04/2020   Angina, class II (HCC) 10/04/2020   Angina of effort (HCC) 08/23/2020   Lung nodules 08/05/2020   Abnormal abdominal CT scan 08/05/2020   Carpal tunnel syndrome, right upper limb 07/25/2020   Annual physical exam 07/14/2020   Neuropathy 07/14/2020   Primary hypertension 07/14/2020   OSA on CPAP 12/14/2019   Neck ache 12/14/2019   Anxiety 12/14/2019   Class 1 obesity due to excess calories with serious comorbidity and body mass index (BMI) of 32.0 to 32.9 in adult 12/14/2019     Discharge Diagnoses:  Patient Active Problem List   Diagnosis Date Noted   S/P CABG x 3 01/06/2024   NSTEMI (non-ST elevated myocardial infarction) (HCC) 01/02/2024   Essential hypertension 01/02/2024   Dyslipidemia 01/02/2024   Type 2 diabetes mellitus without complications (HCC) 01/02/2024   GERD without esophagitis 01/02/2024   CAD (coronary artery disease) 01/02/2024   Unstable angina (HCC) 01/02/2024   Atrial  fibrillation, new onset (HCC) 01/01/2024   Maxillary sinusitis 03/20/2021   Periorbital cellulitis 03/20/2021   Hypotension due to hypovolemia 01/23/2021   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 intake 01/11/2021   Hepatic steatosis 10/25/2020   Abnormal cardiac CT angiography 10/04/2020   Angina, class II (HCC) 10/04/2020   Angina of effort (HCC) 08/23/2020   Lung nodules 08/05/2020   Abnormal abdominal CT scan 08/05/2020   Carpal tunnel syndrome, right upper limb 07/25/2020   Annual physical exam 07/14/2020   Neuropathy 07/14/2020   Primary hypertension 07/14/2020   OSA on CPAP 12/14/2019   Neck ache 12/14/2019   Anxiety 12/14/2019   Class 1 obesity due to excess calories with serious comorbidity and body mass index (BMI) of 32.0 to 32.9 in adult 12/14/2019     Discharged Condition: good  Referring Physician: Dr. Anner- cath, Dr. Shlomo primary Cards   HPI: At time of CT surgical consultation   c/o of CP   Mr. Conry is a 78 yo man with a known history of CAD and chronic stable angina.  History of hypertension, hyperlipidemia, sleep apnea, pancreatitis, reflux.  Presented with a 3 day history of accelerating CP.  Increased frequency and severity. Presented after prolonged episode on Wednesday lasting over 2 hours.  Also noted irregular HR.  Was found to be in new onset atrial fib with RVR.  Initial troponin 125, increased to  5240.    Underwent cath- CTO of RCA, 95% LAD and 90% circumflex stenoses.  Bifurcated LAD  with proximal septal perforator branch and larger anterior apical branch.  Echo showed normal EF with no significant valvular pathology.  The patient and all relevant studies were reviewed by Dr. Kerrin who recommended proceeding with CABG.  Hospital course:  The patient underwent full diagnostic evaluation and was felt to be stable to proceed and on 01/06/2024 he was taken to the operating room at which time he underwent CABG x 3.  A  LIMA-LAD, SVG-PL and a left radial-OM were placed.  He tolerated the procedure well and was taken to the surgical intensive care unit in stable condition.  Postoperative hospital course:  The patient was extubated using standard post cardiac surgical protocols without difficulty.  He has remained hemodynamically stable and normal sinus rhythm.  He has had no recurrence of his preoperative atrial fibrillation.  Swan-Ganz and arterial line were removed on postop day #1.  Plan was to initiate aspirin  and add Plavix  after pacing wires are removed and this has occurred.  He has been started on VASc for radial artery harvest.  He is also on beta-blocker and statin.  He is tolerating routine activity progression using standard post cardiac surgical protocols.  He is undergoing usual pulmonary hygiene maneuvers.  Blood sugars were managed in a routine manner.  Mackie has been resumed which she was on preoperatively. Diet was advanced as tolerated.  He does have an expected acute blood loss anemia which is being monitored clinically and not into the transfusion threshold.  He has started on iron supplement.  He is undergoing a routine diuresis for surgical associated volume overload not related to heart failure.  Oxygen has been weaned and maintains good saturations on room air.  Incisions are healing well without evidence of infection.  His left hand is neurovascularly intact. Pressures been under good control for the most part and he has been resumed on a half dose of his ARB.  Overall, at the time of discharge the patient is felt to be quite stable.   Consults: cardiology  Significant Diagnostic Studies:  ECHO INTRAOPERATIVE TEE Result Date: 01/09/2024  *INTRAOPERATIVE TRANSESOPHAGEAL REPORT *  Patient Name:   LADARRION TELFAIR Prowell Date of Exam: 01/06/2024 Medical Rec #:  [id number]        Height:       66.0 in Accession #:    [id number]       Weight:       214.0 lb Date of Birth:  1945-08-30        BSA:          2.06  m Patient Age:    78 years         BP:           113/63 mmHg Patient Gender: M                HR:           57 bpm. Exam Location:  Anesthesiology Transesophogeal exam was perform intraoperatively during surgical procedure. Patient was closely monitored under general anesthesia during the entirety of examination. Indications:     CAD Native Vessel i25.10 Sonographer:     Damien Senior RDCS Performing Phys: [id number] BROCKS HENDRICKSON Diagnosing Phys: Juliene Clinton MD Complications: No known complications during this procedure. POST-OP IMPRESSIONS Overall, there were no significant changes from pre-bypass. PRE-OP FINDINGS  Left Ventricle: The left ventricle has normal systolic function, with an ejection fraction of 55-60%. The cavity size was normal. There is no increase in left ventricular wall  thickness. There is no left ventricular hypertrophy. Right Ventricle: The right ventricle has normal systolic function. The cavity was normal. There is no increase in right ventricular wall thickness. Left Atrium: Left atrial size was normal in size. No left atrial/left atrial appendage thrombus was detected. Right Atrium: Right atrial size was normal in size. Interatrial Septum: No atrial level shunt detected by color flow Doppler. Pericardium: There is no evidence of pericardial effusion. Mitral Valve: The mitral valve is normal in structure. Mitral valve regurgitation is not visualized by color flow Doppler. Tricuspid Valve: The tricuspid valve was normal in structure. Tricuspid valve regurgitation was not visualized by color flow Doppler. Aortic Valve: The aortic valve is tricuspid Aortic valve regurgitation is trivial by color flow Doppler. There is no stenosis of the aortic valve. Pulmonic Valve: The pulmonic valve was normal in structure. Pulmonic valve regurgitation is trivial by color flow Doppler. Aorta: The aortic root, ascending aorta and aortic arch are normal in size and structure.  Juliene Clinton MD Electronically  signed by Juliene Clinton MD Signature Date/Time: 01/09/2024/1:37:28 PM    Final    DG Chest Port 1 View Result Date: 01/08/2024 CLINICAL DATA:  [id number] S/P CABG (coronary artery bypass graft) [id number] EXAM: PORTABLE CHEST - 1 VIEW COMPARISON:  January 07, 2024 FINDINGS: Central pulmonary vascular congestion. Trace left pleural effusion, unchanged, with left basilar atelectasis. No pneumothorax. Cardiomegaly. Sternotomy wires and left atrial appendage clip. Tortuous aorta with aortic atherosclerosis. Interval removal of the pulmonary artery catheter with persistent introducer sheath in the right SVC. The mediastinal drains and thoracostomy tubes have been removed. IMPRESSION: 1. Mild central pulmonary vascular congestion with trace left pleural effusion. No pneumothorax. 2. Interval removal of tubes and lines, as delineated above. Electronically Signed   By: Rogelia Myers M.D.   On: 01/08/2024 09:27   DG Chest Port 1 View Result Date: 01/07/2024 CLINICAL DATA:  78 year old male status post CABG postoperative day 1. EXAM: PORTABLE CHEST 1 VIEW COMPARISON:  Portable chest yesterday and earlier. FINDINGS: Portable AP semi upright view at 0535 hours. Extubated. Enteric tube removed. Otherwise stable lines and tubes, including right IJ approach Swan-Ganz catheter. Stable to mildly improved lung volumes. No pneumothorax or pulmonary edema identified. Evidence of small pleural effusions. Postoperative changes to the mediastinum, stable cardiac size and mediastinal contours. IMPRESSION: 1. Extubated. Enteric tube removed. Otherwise stable lines and tubes. 2. Stable to mildly improved lung volumes. Small pleural effusions. No pneumothorax or pulmonary edema identified. Electronically Signed   By: VEAR Hurst M.D.   On: 01/07/2024 09:29   DG Chest Port 1 View Result Date: 01/06/2024 CLINICAL DATA:  [id number] S/P CABG x 3 [id number] EXAM: PORTABLE CHEST - 1 VIEW COMPARISON:  January 01, 2024 FINDINGS: Endotracheal tube is well positioned  terminating in the mid trachea. Esophagogastric tube courses below the diaphragm with the distal tip outside the field of view. Right IJ approach introducer sheath with a pulmonary artery catheter in place terminating in the mediastinum in the region of the main pulmonary artery. Mediastinal drains and left thoracostomy tube. Lower lung volumes. Nodular opacity in the left mid lung, likely atelectasis. Trace left pleural effusion. No pneumothorax. Mild cardiomegaly. Left atrial appendage clip. CABG markers and sternotomy wires. Aortic atherosclerosis. IMPRESSION: 1. Well-positioned support tubes and lines, as described above. 2. Low lung volumes with trace left pleural effusion. No pneumothorax. Electronically Signed   By: Rogelia Myers M.D.   On: 01/06/2024 16:40   VAS US  DOPPLER PRE CABG Result Date:  01/05/2024 PREOPERATIVE VASCULAR EVALUATION Patient Name:  HAMMOND OBEIRNE  Date of Exam:   01/05/2024 Medical Rec #: [id number]         Accession #:    [id number] Date of Birth: [birth year]         Patient Gender: M Patient Age:   62 years Exam Location:  [facility] Procedure:      VAS US  DOPPLER PRE CABG Referring Phys: LYNWOOD SCHILLING --------------------------------------------------------------------------------  Indications:      Pre-CABG. Risk Factors:     Hypertension, hyperlipidemia, Diabetes, coronary artery                   disease. Other Factors:    OSA, compliant with CPAP. Comparison Study: No prior study on file Performing Technologist: Rachel Pellet RVS  Examination Guidelines: A complete evaluation includes B-mode imaging, spectral Doppler, color Doppler, and power Doppler as needed of all accessible portions of each vessel. Bilateral testing is considered an integral part of a complete examination. Limited examinations for reoccurring indications may be performed as noted.  Right Carotid Findings: +----------+--------+--------+--------+------------+--------+           PSV cm/sEDV  cm/sStenosisDescribe    Comments +----------+--------+--------+--------+------------+--------+ CCA Prox  104     16                                   +----------+--------+--------+--------+------------+--------+ CCA Distal88      17                                   +----------+--------+--------+--------+------------+--------+ ICA Prox  105     23      1-39%   heterogenous         +----------+--------+--------+--------+------------+--------+ ICA Mid   138     36                          tortuous +----------+--------+--------+--------+------------+--------+ ICA Distal86      24                          tortuous +----------+--------+--------+--------+------------+--------+ ECA       206     16                                   +----------+--------+--------+--------+------------+--------+ +----------+--------+-------+--------+------------+           PSV cm/sEDV cmsDescribeArm Pressure +----------+--------+-------+--------+------------+ Subclavian113                    120          +----------+--------+-------+--------+------------+ +---------+--------+--+--------+-+ VertebralPSV cm/s36EDV cm/s9 +---------+--------+--+--------+-+ Left Carotid Findings: +----------+--------+--------+--------+------------------+------------------+           PSV cm/sEDV cm/sStenosisDescribe          Comments           +----------+--------+--------+--------+------------------+------------------+ CCA Prox  81      14                                intimal thickening +----------+--------+--------+--------+------------------+------------------+ CCA Distal88      20  intimal thickening +----------+--------+--------+--------+------------------+------------------+ ICA Prox  108     23      1-39%   focal and calcific                   +----------+--------+--------+--------+------------------+------------------+ ICA Mid   112      26                                tortuous           +----------+--------+--------+--------+------------------+------------------+ ICA Distal79      26                                tortuous           +----------+--------+--------+--------+------------------+------------------+ ECA       153     18                                                   +----------+--------+--------+--------+------------------+------------------+  +----------+--------+--------+--------+------------+ SubclavianPSV cm/sEDV cm/sDescribeArm Pressure +----------+--------+--------+--------+------------+           83                                   +----------+--------+--------+--------+------------+ +---------+--------+--+--------+--+ VertebralPSV cm/s46EDV cm/s11 +---------+--------+--+--------+--+  ABI Findings: +------------------+-----+---------+ Rt Pressure (mmHg)IndexWaveform  +------------------+-----+---------+ 120                    triphasic +------------------+-----+---------+ 182               1.52 triphasic +------------------+-----+---------+ 151               1.26 triphasic +------------------+-----+---------+ 108               0.90 Normal    +------------------+-----+---------+ +------------------+-----+---------+----------+ Lt Pressure (mmHg)IndexWaveform Comment    +------------------+-----+---------+----------+                        triphasicRestricted +------------------+-----+---------+----------+ 175               1.46 triphasic           +------------------+-----+---------+----------+ 143               1.19 triphasic           +------------------+-----+---------+----------+ 86                0.72 Normal              +------------------+-----+---------+----------+ +-------+---------------+ ABI/TBIToday's ABI/TBI +-------+---------------+ Right  1.5/0.90        +-------+---------------+ Left   1.46/0.72        +-------+---------------+  Right Doppler Findings: +--------+--------+---------+ Site    PressureDoppler   +--------+--------+---------+ [id number]     triphasic +--------+--------+---------+ Radial          triphasic +--------+--------+---------+ Ulnar           triphasic +--------+--------+---------+  Left Doppler Findings: +--------+---------+----------+ Site    Doppler  Comments   +--------+---------+----------+ BrachialtriphasicRestricted +--------+---------+----------+ Radial  triphasic           +--------+---------+----------+ Ulnar   triphasic           +--------+---------+----------+   Summary:  Right Carotid: Velocities in the right ICA are consistent with a 1-39% stenosis. Left Carotid: Velocities in the left ICA are consistent with a 1-39% stenosis. Vertebrals:  Bilateral vertebral arteries demonstrate antegrade flow. Subclavians: Normal flow hemodynamics were seen in bilateral subclavian              arteries. Right ABI: Resting right ankle-brachial index indicates noncompressible right lower extremity arteries. The right toe-brachial index is normal. Left ABI: Resting left ankle-brachial index indicates noncompressible left lower extremity arteries. The left toe-brachial index is normal. Right Upper Extremity: Doppler waveforms remain within normal limits with right radial compression. Doppler waveform obliterate with right ulnar compression. Left Upper Extremity: Doppler waveforms remain within normal limits with left radial compression. Doppler waveforms remain within normal limits with left ulnar compression.  Electronically signed by Gaile New MD on 01/05/2024 at 7:51:41 PM.    Final    ECHOCARDIOGRAM COMPLETE Result Date: 01/03/2024    ECHOCARDIOGRAM REPORT   Patient Name:   DAWSEN KRIEGER Carriker Date of Exam: 01/03/2024 Medical Rec #:  [id number]        Height:       66.0 in Accession #:    [id number]       Weight:       214.0 lb Date of Birth:  01/18/1946         BSA:          2.058 m Patient Age:    78 years         BP:           137/67 mmHg Patient Gender: M                HR:           33 bpm. Exam Location:  [facility] Procedure: 2D Echo, Cardiac Doppler and Color Doppler (Both Spectral and Color            Flow Doppler were utilized during procedure). Indications:     NSTEMI  History:         Patient has prior history of Echocardiogram examinations, most                  recent 01/11/2022. CAD, Arrythmias:Atrial Fibrillation,                  Signs/Symptoms:Hypotension; Risk Factors:Hypertension.  Sonographer:     Philomena Daring Referring Phys:  [id number] GJW A MANSY Diagnosing Phys: Redell Cave MD  Sonographer Comments: Patient is obese and Technically difficult study due to poor echo windows. Image acquisition challenging due to patient body habitus. IMPRESSIONS  1. Left ventricular ejection fraction, by estimation, is 60 to 65%. The left ventricle has normal function. The left ventricle has no regional wall motion abnormalities. There is mild left ventricular hypertrophy. Left ventricular diastolic parameters are consistent with Grade I diastolic dysfunction (impaired relaxation).  2. Right ventricular systolic function is normal. The right ventricular size is normal.  3. The mitral valve is normal in structure. No evidence of mitral valve regurgitation.  4. The aortic valve was not well visualized. Aortic valve regurgitation is not visualized.  5. The inferior vena cava is normal in size with greater than 50% respiratory variability, suggesting right atrial pressure of 3 mmHg. FINDINGS  Left Ventricle: Left ventricular ejection fraction, by estimation, is 60 to 65%. The left ventricle has normal function. The left ventricle has no regional wall motion abnormalities. The left ventricular internal cavity size was normal in size. There  is  mild left ventricular hypertrophy. Left ventricular diastolic parameters are consistent with Grade I diastolic dysfunction (impaired  relaxation). Right Ventricle: The right ventricular size is normal. No increase in right ventricular wall thickness. Right ventricular systolic function is normal. Left Atrium: Left atrial size was normal in size. Right Atrium: Right atrial size was normal in size. Pericardium: There is no evidence of pericardial effusion. Mitral Valve: The mitral valve is normal in structure. No evidence of mitral valve regurgitation. Tricuspid Valve: The tricuspid valve is normal in structure. Tricuspid valve regurgitation is mild. Aortic Valve: The aortic valve was not well visualized. Aortic valve regurgitation is not visualized. Pulmonic Valve: The pulmonic valve was not well visualized. Pulmonic valve regurgitation is not visualized. Aorta: The aortic root and ascending aorta are structurally normal, with no evidence of dilitation. Venous: The inferior vena cava is normal in size with greater than 50% respiratory variability, suggesting right atrial pressure of 3 mmHg. IAS/Shunts: No atrial level shunt detected by color flow Doppler.  LEFT VENTRICLE PLAX 2D LVIDd:         4.39 cm   Diastology LV PW:         1.24 cm   LV e' medial:    4.45 cm/s LV IVS:        1.33 cm   LV E/e' medial:  17.9 LVOT diam:     2.10 cm   LV e' lateral:   9.38 cm/s LV SV:         107       LV E/e' lateral: 8.5 LV SV Index:   52 LVOT Area:     3.46 cm  RIGHT VENTRICLE             IVC RV S prime:     12.20 cm/s  IVC diam: 2.14 cm TAPSE (M-mode): 1.7 cm LEFT ATRIUM             Index        RIGHT ATRIUM           Index LA Vol (A2C):   59.9 ml 29.10 ml/m  RA Area:     18.00 cm LA Vol (A4C):   54.4 ml 26.43 ml/m  RA Volume:   45.80 ml  22.25 ml/m LA Biplane Vol: 59.1 ml 28.72 ml/m  AORTIC VALVE LVOT Vmax:   139.00 cm/s LVOT Vmean:  80.400 cm/s LVOT VTI:    0.310 m  AORTA Ao Root diam: 2.70 cm Ao Asc diam:  3.00 cm MITRAL VALVE                TRICUSPID VALVE MV Area (PHT): 3.01 cm     TR Peak grad:   6.8 mmHg MV Decel Time: 252 msec     TR Vmax:         130.00 cm/s MV E velocity: 79.50 cm/s MV A velocity: 100.00 cm/s  SHUNTS MV E/A ratio:  0.80         Systemic VTI:  0.31 m                             Systemic Diam: 2.10 cm Redell Cave MD Electronically signed by Redell Cave MD Signature Date/Time: 01/03/2024/9:50:03 AM    Final    CARDIAC CATHETERIZATION Result Date: 01/02/2024 Images from the original result were not included.   Severe multivessel CAD with known occlusion of the RCA and progression of proximal LAD/LCx stenosis to  95% LAD and 90% LCx.   Mid LM to Prox LAD lesion is 20% stenosed with 40% stenosed side branch in Ost Cx.   Prox LAD lesion is 95% stenosed. Dist LAD lesion is 40% stenosed.   Ost Cx to Prox Cx lesion is 90% stenosed.  Mid Cx lesion is 50% stenosed.  Mid Cx to Dist Cx lesion is 60% stenosed with 50% stenosed side branch in 3rd Mrg.   Prox RCA to Dist RCA lesion is 100% stenosed with 100% stenosed side branch in RPAV.   --------------------------------------------------   LV end diastolic pressure is mildly elevated.  There is no aortic valve stenosis.   -------------------------------------------------- Dominance: Right: RECOMMENDATION   Multivessel CAD-recommend CABG x 3.  Patient be transferred to [facility] for CVTS consultation. Continue to titrate GDMT for CAD.  Anticipated discharge date to be determined, but okay for discharge.  on 01/02/2024.   Restart IV Heparin  for ACS/Afib --for coverage until CABG.  Recommend concurrent antiplatelet therapy of Aspirin  81 mg daily for 6 months. Alm Clay, MD  DG Chest 2 View Result Date: 01/01/2024 CLINICAL DATA:  Chest pain. EXAM: CHEST - 2 VIEW COMPARISON:  None Available. FINDINGS: The heart size and mediastinal contours are within normal limits. Both lungs are clear. The visualized skeletal structures are unremarkable. IMPRESSION: No active cardiopulmonary disease. Electronically Signed   By: Suzen Dials M.D.   On: 01/01/2024 22:42       Treatments:  surgery:   Operative Report    DATE OF PROCEDURE: 01/06/2024   PREOPERATIVE DIAGNOSES:  Three-vessel coronary artery disease status post non-ST elevation MI and paroxysmal atrial fibrillation.   POSTOPERATIVE DIAGNOSES:  Three-vessel coronary artery disease status post non-ST elevation MI and paroxysmal atrial fibrillation.   PROCEDURES PERFORMED:  Median sternotomy, extracorporeal circulation, coronary artery bypass grafting x 3 (left internal mammary artery to LAD, left radial artery to OM, saphenous vein graft to posterior descending), pulmonary vein isolation using  Encompass radiofrequency ablation device and 44 mm Medtronic Penditure left atrial appendage clipping.   SURGEON:  Elspeth BROCKS. Kerrin, MD   ASSISTANT:  Laurel Becket; Lemond Cera  Discharge Exam: Blood pressure 139/71, pulse 72, temperature 97.8 F (36.6 C), temperature source Oral, resp. rate 18, height 5' 6 (1.676 m), weight 98.7 kg, SpO2 100%.   General appearance: alert, cooperative, and no distress Heart: regular rate and rhythm Lungs: clear to auscultation bilaterally Abdomen: benign Extremities: no edema Wound: incis healing well, left hand N/V intact  Discharge Medications:  The patient has been discharged on:   1.Beta Blocker:  Yes [ y  ]                              No   [   ]                              If No, reason:  2.Ace Inhibitor/ARB: Yes [ y  ]                                     No  [    ]  If No, reason:  3.Statin:   Yes [ y  ]                  No  [   ]                  If No, reason:  4.Ecasa:  Yes  [  y ]                  No   [   ]                  If No, reason:  Patient had ACS upon admission:y  Plavix /P2Y12 inhibitor: Yes [  y ]                                      No  [   ]     Discharge Instructions     Amb Referral to Cardiac Rehabilitation   Complete by: As directed    Diagnosis:  CABG NSTEMI     CABG X ___: 3    After initial evaluation and assessments completed: Virtual Based Care may be provided alone or in conjunction with Phase 2 Cardiac Rehab based on patient barriers.: Yes   Intensive Cardiac Rehabilitation (ICR) [facility] location only OR Traditional Cardiac Rehabilitation (TCR) *If criteria for ICR are not met will enroll in TCR ([facility] only): Yes      Allergies as of 01/10/2024       Reactions   Metronidazole  Other (See Comments)   Patient stated that he had a moderate level of pain with his allergic reaction.    Ace Inhibitors Cough   Dilaudid  [hydromorphone  Hcl] Nausea And Vomiting        Medication List     STOP taking these medications    furosemide  20 MG tablet Commonly known as: LASIX    heparin  25000 UT/250ML infusion   isosorbide  mononitrate 30 MG 24 hr tablet Commonly known as: IMDUR    ranolazine  500 MG 12 hr tablet Commonly known as: Ranexa        TAKE these medications    ALPRAZolam  0.5 MG tablet Commonly known as: XANAX  Take 0.25 mg by mouth at bedtime as needed for sleep.   amLODipine  5 MG tablet Commonly known as: NORVASC  TAKE 1 TABLET EVERY DAY   aspirin  EC 81 MG tablet Take 81 mg by mouth daily.   atorvastatin  80 MG tablet Commonly known as: LIPITOR  TAKE 1 TABLET EVERY DAY   Cholecalciferol  250 MCG (10000 UT) Tabs Take 1,000 Units by mouth daily.   clopidogrel  75 MG tablet Commonly known as: PLAVIX  Take 1 tablet (75 mg total) by mouth daily.   cyanocobalamin  1000 MCG tablet Take 1,000 mcg by mouth daily.   Fe Fum-Vit C-Vit B12-FA Caps capsule Commonly known as: TRIGELS-F FORTE Take 1 capsule by mouth daily after breakfast.   fluticasone  50 MCG/ACT nasal spray Commonly known as: FLONASE  Place 2 sprays into both nostrils daily.   losartan  25 MG tablet Commonly known as: COZAAR  Take 1 tablet (25 mg total) by mouth daily. What changed:  medication strength how much to take   melatonin 3 MG Tabs tablet Take 10 mg by mouth at bedtime.    metFORMIN  500 MG tablet Commonly known as: GLUCOPHAGE  Take 500 mg by mouth 2 (two) times daily.   metoprolol  tartrate 25 MG tablet Commonly known as: LOPRESSOR   Take 1 tablet (25 mg total) by mouth 2 (two) times daily. What changed:  medication strength how much to take   pantoprazole  40 MG tablet Commonly known as: PROTONIX  Take 40 mg by mouth 2 (two) times daily before a meal.   traMADol  50 MG tablet Commonly known as: ULTRAM  Take 1 tablet (50 mg total) by mouth every 6 (six) hours as needed for up to 7 days for moderate pain (pain score 4-6).        Follow-up Information     Janene Boer, [place] Follow up.   Specialties: Cardiology, Radiology Why: See discharge paperwork for details of follow-up appointment with cardiology. Contact information: [address] [place] [address] [phone]         Kerrin Elspeth BROCKS, MD Follow up.   Specialty: Cardiothoracic Surgery Why: Please see discharge paperwork for details of follow-up appointment.  On the date you have an appointment you will obtain a chest x-ray in the same office Bldg. 1 hour prior to the appointment with surgeon. Contact information: [address] [place] [address] [phone]                 Signed:  Lemond FORBES Cera, PA-C  01/10/2024, 12:01 PM

## 2024-01-07 NOTE — TOC Initial Note (Signed)
 Transition of Care [facility]) - Initial/Assessment Note    Patient Details  Name: Daniel Holden MRN: [id number] Date of Birth: 09-Jul-1945  Transition of [facility]) CM/SW Contact:    Sudie Erminio Deems, RN Phone Number: 01/07/2024, 1:34 PM  Clinical Narrative: Patient presented for unstable angina-POD-1 CABG. PTA patient was independent from home with spouse. Patient has DME CPAP. Patient has insurance and PCP; gets to appointments without any issues. Case Manager will continue to follow for transition of care needs as the patient progresses.                  Expected Discharge Plan: Home w Home Health Services Barriers to Discharge: Continued Medical Work up   Patient Goals and CMS Choice Patient states their goals for this hospitalization and ongoing recovery are:: Plan to return home once stable.   Choice offered to / list presented to : NA      Expected Discharge Plan and Services In-house Referral: NA Discharge Planning Services: CM Consult Post Acute Care Choice: Home Health Living arrangements for the past 2 months: Single Family Home                   DME Agency: NA       HH Arranged: NA          Prior Living Arrangements/Services Living arrangements for the past 2 months: Single Family Home Lives with:: Spouse Patient language and need for interpreter reviewed:: Yes Do you feel safe going back to the place where you live?: Yes      Need for Family Participation in Patient Care: Yes (Comment) Care giver support system in place?: Yes (comment) Current home services: DME (CPAP) Criminal Activity/Legal Involvement Pertinent to Current Situation/Hospitalization: No - Comment as needed  Activities of Daily Living   ADL Screening (condition at time of admission) Independently performs ADLs?: Yes (appropriate for developmental age) Is the patient deaf or have difficulty hearing?: No Does the patient have difficulty seeing, even when wearing  glasses/contacts?: No Does the patient have difficulty concentrating, remembering, or making decisions?: No  Permission Sought/Granted Permission sought to share information with : Family Supports, Case Manager                Emotional Assessment Appearance:: Appears stated age Attitude/Demeanor/Rapport: Engaged Affect (typically observed): Appropriate Orientation: : Oriented to Self, Oriented to Place, Oriented to Situation, Oriented to  Time Alcohol / Substance Use: Not Applicable Psych Involvement: No (comment)  Admission diagnosis:  Unstable angina [I20.0] NSTEMI (non-ST elevated myocardial infarction) (HCC) [I21.4] Unstable angina (HCC) [I20.0] S/P CABG x 3 [Z95.1] Patient Active Problem List   Diagnosis Date Noted   S/P CABG x 3 01/06/2024   NSTEMI (non-ST elevated myocardial infarction) (HCC) 01/02/2024   Essential hypertension 01/02/2024   Dyslipidemia 01/02/2024   Type 2 diabetes mellitus without complications (HCC) 01/02/2024   GERD without esophagitis 01/02/2024   CAD (coronary artery disease) 01/02/2024   Unstable angina (HCC) 01/02/2024   Atrial fibrillation, new onset (HCC) 01/01/2024   Maxillary sinusitis 03/20/2021   Periorbital cellulitis 03/20/2021   Hypotension due to hypovolemia 01/23/2021   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 intake 01/11/2021   Hepatic steatosis 10/25/2020   Abnormal cardiac CT angiography 10/04/2020   Angina, class II (HCC) 10/04/2020   Angina of effort (HCC) 08/23/2020   Lung nodules 08/05/2020   Abnormal abdominal CT scan 08/05/2020   Carpal tunnel syndrome, right upper limb 07/25/2020   Annual physical exam  07/14/2020   Neuropathy 07/14/2020   Primary hypertension 07/14/2020   OSA on CPAP 12/14/2019   Neck ache 12/14/2019   Anxiety 12/14/2019   Class 1 obesity due to excess calories with serious comorbidity and body mass index (BMI) of 32.0 to 32.9 in adult 12/14/2019   PCP:  Sherial Bail,  MD Pharmacy:   [facility] (Now [facility]) - [address] [place], [place] - [address] [place] [address] Phone: [phone] Fax: [phone]  [facility] [id number] [place], [place] - [address] [place] [place] [address] Phone: [phone] Fax: [phone]  [facility] - [place], [place] - [address] [place] [address] Phone: [phone] Fax: [phone]     Social Drivers of Health (SDOH) Social History: SDOH Screenings   Food Insecurity: No Food Insecurity (01/03/2024)  Housing: Low Risk  (01/03/2024)  Transportation Needs: No Transportation Needs (01/03/2024)  Utilities: Not At Risk (01/03/2024)  Depression (PHQ2-9): Low Risk  (05/26/2021)  Financial Resource Strain: Low Risk  (12/23/2023)   Received from [facility]  Social Connections: Moderately Integrated (01/03/2024)  Tobacco Use: Low Risk  (01/06/2024)  Recent Concern: Tobacco Use - Medium Risk (12/23/2023)   Received from [facility]   SDOH Interventions:     Readmission Risk Interventions     No data to display

## 2024-01-07 NOTE — Progress Notes (Signed)
      [address]       [place] [address]             [phone]      POD # 1  C/o pain left arm incision Emesis earlier, nausea resolved for now  BP 127/60   Pulse 74   Temp 98.2 F (36.8 C) (Oral)   Resp (!) 24   Ht 5' 6 (1.676 m)   Wt 100.1 kg   SpO2 93%   BMI 35.62 kg/m    Intake/Output Summary (Last 24 hours) at 01/07/2024 1810 Last data filed at 01/07/2024 1700 Gross per 24 hour  Intake 3267.08 ml  Output 2721 ml  Net 546.08 ml   PM labs pending  Elspeth C. Kerrin, MD [facility] [phone]

## 2024-01-07 NOTE — Op Note (Signed)
 NAME: Daniel Holden, WITCZAK MEDICAL RECORD NO: [id number] ACCOUNT NO: [id number] DATE OF BIRTH: 28-Apr-1946 FACILITY: [facility] LOCATION: [facility] PHYSICIAN: Elspeth BROCKS. Kerrin, MD  Operative Report   DATE OF PROCEDURE: 01/06/2024  PREOPERATIVE DIAGNOSES:  Three-vessel coronary artery disease status post non-ST elevation MI and paroxysmal atrial fibrillation.  POSTOPERATIVE DIAGNOSES:  Three-vessel coronary artery disease status post non-ST elevation MI and paroxysmal atrial fibrillation.  PROCEDURES PERFORMED:   Median sternotomy, extracorporeal circulation,  Coronary artery bypass grafting x 3  Left internal mammary artery to LAD,  Left radial artery to OM,  Saphenous vein graft to posterior descending,  Pulmonary vein isolation using Encompass radiofrequency ablation device and  Left atrial appendage clipping using 40 mm Medtronic Penditure clip.  SURGEON:  Elspeth BROCKS. Kerrin, MD  ASSISTANT:  Laurel Becket, PA; Lemond Cera, [place]  CLINICAL NOTE:  Mr. Quevedo is a 78 year old gentleman with known coronary artery disease and no prior history of atrial fibrillation.  He presented with prolonged chest pain and was noted to be in new-onset atrial fibrillation with rapid ventricular response.  Initial troponin was elevated and then subsequently increased to greater than 5000.  Catheterization revealed severe three-vessel coronary artery disease and he was referred for coronary artery bypass grafting.  He also was offered pulmonary vein isolation and left atrial appendage clipping at the same setting.  The indications, risks, benefits, and alternatives were discussed in detail with the patient.  He understood and accepted the risks and agreed to proceed.  OPERATIVE NOTE:  Mr. Fleener was brought to the operating room on 01/06/2024.  He had induction of general anesthesia and was intubated.  Transesophageal echocardiography was performed.  It showed mild aortic insufficiency.  There was preserved left  ventricular systolic function.  Foley catheter was placed.  Intravenous antibiotics were administered.  The chest, abdomen, legs, and left arm were prepped and draped in the usual sterile fashion.  A time-out was performed.  The conduits were harvested simultaneously.  A median sternotomy was performed and the left internal mammary artery was harvested under direct vision.  Simultaneously, Laurel Becket made an incision in the left arm.  An incision was made distally first.  A short segment of the radial artery was dissected out.  There was a good pulse distally and a good Doppler signal in the palmar arch with radial occlusion.  The incision then was extended below the antecubital fossa and the radial artery was harvested using the harmonic scalpel.  Simultaneously, Lemond Cera made an incision in the medial aspect of the right leg at the level of the knee.  The greater saphenous vein was harvested from the right thigh endoscopically.   All conduits were good quality.  2000 units of heparin  was administered during the vessel harvest.  The remainder of the full heparin  dose was given prior to opening the pericardium.  The leg and arm incisions were closed.  The arm then was tucked back to the patient's side.  A sternal retractor was placed.  The pericardium was opened.  After confirming adequate anticoagulation with ACT measurement, the aorta was cannulated via concentric 2-0 Ethibond pledgeted pursestring sutures.  A dual-stage venous cannula was placed via a pursestring suture in the right atrial appendage.  Cardiopulmonary bypass was initiated.  Flows were maintained per protocol.  The patient was cooled to 32 degrees Celsius.  The coronary arteries were inspected and anastomotic sites were chosen.  The posterior descending was a diffusely diseased poor quality target vessel.  The posterolateral was too  small to graft.  The OM and LAD appeared to be good quality vessels.  The Encompass device then was  placed around the left atrium just proximal to the insertion of the pulmonary veins.  Four sets of paired ablation lines then were performed with transmurality confirmed with each ablation.  The left atrial appendage sized for a 40 mm Penditure clip.  A foam pad was placed in the pericardium to insulate the heart and a temperature probe was placed in the myocardial septum and a cardioplegia cannula was placed in the ascending aorta.  The aorta was cross-clamped.  The left ventricle was emptied via the aortic root vent.  Cardiac arrest then was achieved with a combination of cold antegrade blood cardioplegia and topical ice saline.  1 liter of cardioplegia was administered.  There was a rapid diastolic arrest and septal cooling to 13 degrees Celsius.  A reversed saphenous vein graft was placed to the posterior descending branch of the right coronary.  This was a 1 mm diffusely diseased vessel.  The vein was anastomosed end-to-side with a running 7-0 Prolene suture.  A probe was passed proximally and distally.  Cardioplegia was administered and there was satisfactory flow and good hemostasis.  Next, the distal end of the left radial artery was beveled.  It was anastomosed end-to-side to the obtuse marginal.  This was a 1.5 mm good quality target vessel.  It bifurcated and the graft was placed to the second bifurcation branch.  The anastomosis was performed with a running 8-0 Prolene suture.  A probe passed easily proximally and distally.  Cardioplegia was administered down the graft and there was good flow and good hemostasis.  The left internal mammary artery was brought through a window in the pericardium.  The distal end was beveled.  It was anastomosed end-to-side to the LAD.  The LAD was a bifurcated system with a septal perforating branch, which could not be identified and then a larger anterior branch, which was grafted.  This was a 1.5 mm good quality target vessel.  The mammary was anastomosed  end-to-side with a running 8-0 Prolene suture.  At the completion of the mammary to LAD anastomosis, the bulldog clamp was briefly removed and inspected for hemostasis.  Rapid septal rewarming was noted.  The bulldog clamp was replaced and the mammary pedicle was tacked to the epicardial surface of the heart with 6-0 Prolene sutures.  It should be noted that the  left atrial appendage clip was placed across the base of the left atrium after doing the radial graft, but prior to doing the mammary to LAD graft.  Additional cardioplegia was administered.  The vein graft and radial artery were cut to length.  The cardioplegia cannula was removed from the ascending aorta.  The proximal anastomoses were performed to 4.0 mm punch aortotomies.  A 6-0 Prolene was used for the vein and a 7-0 Prolene for the radial.  At the completion of the final proximal anastomosis, the patient was placed in Trendelenburg position.  Lidocaine  was administered.  The aortic root was de-aired and the aortic cross-clamp was removed.  The total cross-clamp time was 58 minutes.  The patient required a single defibrillation with 10 joules  and then, was in sinus bradycardia thereafter.  While rewarming was completed, all proximal and distal anastomoses were inspected for hemostasis.  Epicardial pacing wires were placed on the right ventricle and atrium.  When the patient had rewarmed to a core temperature of 37 degrees Celsius, he  was weaned from cardiopulmonary bypass on the first attempt.  Total bypass time was 112 minutes.  The initial cardiac index was greater than 2 liters per meter squared.  He remained hemodynamically stable throughout the post-bypass period.  He was DDD paced at 80 beats per minute and did not require inotropic support.  Post-bypass transesophageal echocardiography was unchanged from the pre-bypass study.  A test dose of protamine  was administered and was well tolerated.  The atrial and aortic cannulae were removed.   The remainder of the protamine  was administered without incident.  The chest was irrigated with warm saline.  Hemostasis was achieved.  The left pleural and mediastinal chest tubes were placed through separate subcostal incisions and secured with 1-0 silk sutures.  The pericardium was closed with interrupted 3-0 silk sutures.  It came together easily without tension or kinking the underlying grafts.  The sternum was closed with a combination of single and double heavy-gauge stainless steel wires.  Pectoralis fascia and subcutaneous tissue and  skin were closed in a standard fashion.  All sponge, needle, and instrument counts were correct at the end of the procedure.  The patient was transported from the operating room to the surgical intensive care unit intubated and in good condition.  Experienced assistance was necessary for this case due to surgical complexity.  Laurel Becket independently harvested the radial artery and closed the left arm incision.  Wayne Gold independently harvested the saphenous vein from the right thigh and closed the leg incisions and then also assisted with retraction of delicate tissues, exposure, suctioning, and suture management during the anastomoses.   MUK D: 01/06/2024 5:11:29 pm T: 01/07/2024 2:22:00 am  JOB: [id number]/ [id number]

## 2024-01-08 ENCOUNTER — Ambulatory Visit

## 2024-01-08 ENCOUNTER — Inpatient Hospital Stay (HOSPITAL_COMMUNITY)

## 2024-01-08 LAB — TYPE AND SCREEN
ABO/RH(D): O POS
Antibody Screen: NEGATIVE
Unit division: 0
Unit division: 0
Unit division: 0
Unit division: 0

## 2024-01-08 LAB — GLUCOSE, CAPILLARY
Glucose-Capillary: 157 mg/dL — ABNORMAL HIGH (ref 70–99)
Glucose-Capillary: 157 mg/dL — ABNORMAL HIGH (ref 70–99)
Glucose-Capillary: 156 mg/dL — ABNORMAL HIGH (ref 70–99)
Glucose-Capillary: 161 mg/dL — ABNORMAL HIGH (ref 70–99)
Glucose-Capillary: 125 mg/dL — ABNORMAL HIGH (ref 70–99)

## 2024-01-08 LAB — CBC
HCT: 21.3 % — ABNORMAL LOW (ref 39.0–52.0)
Hemoglobin: 7.3 g/dL — ABNORMAL LOW (ref 13.0–17.0)
MCH: 37.6 pg — ABNORMAL HIGH (ref 26.0–34.0)
MCHC: 34.3 g/dL (ref 30.0–36.0)
MCV: 109.8 fL — ABNORMAL HIGH (ref 80.0–100.0)
Platelets: 129 K/uL — ABNORMAL LOW (ref 150–400)
RBC: 1.94 MIL/uL — ABNORMAL LOW (ref 4.22–5.81)
RDW: 12.3 % (ref 11.5–15.5)
WBC: 10 K/uL (ref 4.0–10.5)
nRBC: 0.2 % (ref 0.0–0.2)

## 2024-01-08 LAB — BPAM RBC
Blood Product Expiration Date: 202508112359
Blood Product Expiration Date: 202508112359
Blood Product Expiration Date: 202508112359
Blood Product Expiration Date: 202508112359
ISSUE DATE / TIME: 202507141247
ISSUE DATE / TIME: 202507141247
ISSUE DATE / TIME: 202507141247
ISSUE DATE / TIME: 202507141247
Unit Type and Rh: 5100
Unit Type and Rh: 5100
Unit Type and Rh: 5100
Unit Type and Rh: 5100

## 2024-01-08 LAB — BASIC METABOLIC PANEL WITH GFR
Anion gap: 10 (ref 5–15)
BUN: 10 mg/dL (ref 8–23)
CO2: 22 mmol/L (ref 22–32)
Calcium: 7.6 mg/dL — ABNORMAL LOW (ref 8.9–10.3)
Chloride: 99 mmol/L (ref 98–111)
Creatinine, Ser: 0.9 mg/dL (ref 0.61–1.24)
GFR, Estimated: >60 mL/min (ref >60)
Glucose, Bld: 152 mg/dL — ABNORMAL HIGH (ref 70–99)
Potassium: 3.8 mmol/L (ref 3.5–5.1)
Sodium: 131 mmol/L — ABNORMAL LOW (ref 135–145)

## 2024-01-08 MED ORDER — METFORMIN HCL 500 MG PO TABS
500.0000 mg | ORAL_TABLET | Freq: Two times a day (BID) | ORAL | Status: DC
Start: 2024-01-08 — End: 2024-01-10
  Administered 2024-01-08 – 2024-01-10 (×5): 500 mg via ORAL
  Filled 2024-01-08 (×5): qty 1

## 2024-01-08 MED ORDER — INSULIN ASPART 100 UNIT/ML IJ SOLN
0.0000 [IU] | Freq: Three times a day (TID) | INTRAMUSCULAR | Status: DC
  Administered 2024-01-08 (×2): 4 [IU] via SUBCUTANEOUS
  Administered 2024-01-09 (×3): 3 [IU] via SUBCUTANEOUS

## 2024-01-08 MED ORDER — METOPROLOL TARTRATE 25 MG PO TABS
25.0000 mg | ORAL_TABLET | Freq: Two times a day (BID) | ORAL | Status: DC
  Administered 2024-01-08 – 2024-01-10 (×5): 25 mg via ORAL
  Filled 2024-01-08 (×5): qty 1

## 2024-01-08 MED ORDER — FUROSEMIDE 40 MG PO TABS
40.0000 mg | ORAL_TABLET | Freq: Every day | ORAL | Status: DC
  Administered 2024-01-09 – 2024-01-10 (×2): 40 mg via ORAL
  Filled 2024-01-08 (×2): qty 1

## 2024-01-08 MED ORDER — ASPIRIN 81 MG PO TBEC
81.0000 mg | DELAYED_RELEASE_TABLET | Freq: Every day | ORAL | Status: DC
  Administered 2024-01-09 – 2024-01-10 (×2): 81 mg via ORAL
  Filled 2024-01-08 (×2): qty 1

## 2024-01-08 MED ORDER — FUROSEMIDE 10 MG/ML IJ SOLN
20.0000 mg | Freq: Once | INTRAMUSCULAR | Status: AC
  Administered 2024-01-08: 20 mg via INTRAVENOUS
  Filled 2024-01-08: qty 2

## 2024-01-08 MED ORDER — CLOPIDOGREL BISULFATE 75 MG PO TABS
75.0000 mg | ORAL_TABLET | Freq: Every day | ORAL | Status: DC
  Administered 2024-01-09 – 2024-01-10 (×2): 75 mg via ORAL
  Filled 2024-01-08 (×2): qty 1

## 2024-01-08 MED ORDER — INSULIN ASPART 100 UNIT/ML IJ SOLN: 0.0000 [IU] | Freq: Every day | INTRAMUSCULAR | Status: DC

## 2024-01-08 MED ORDER — POTASSIUM CHLORIDE ER 10 MEQ PO TBCR
20.0000 meq | EXTENDED_RELEASE_TABLET | Freq: Two times a day (BID) | ORAL | Status: DC
  Administered 2024-01-08 – 2024-01-10 (×5): 20 meq via ORAL
  Filled 2024-01-08 (×10): qty 2

## 2024-01-08 NOTE — Progress Notes (Signed)
 Patient requesting enema for constipation. Will pass on to night shift.

## 2024-01-08 NOTE — Plan of Care (Signed)
  Problem: Clinical Measurements: Goal: Will remain free from infection Outcome: Progressing Goal: Diagnostic test results will improve Outcome: Progressing Goal: Cardiovascular complication will be avoided Outcome: Progressing   Problem: Activity: Goal: Risk for activity intolerance will decrease Outcome: Progressing   Problem: Nutrition: Goal: Adequate nutrition will be maintained Outcome: Progressing   Problem: Coping: Goal: Level of anxiety will decrease Outcome: Progressing

## 2024-01-08 NOTE — Progress Notes (Signed)
 2 Days Post-Op Procedure(s) (LRB): CORONARY ARTERY BYPASS GRAFTING X 3, USING LEFT INTERNAL MAMMARY ARTERY, HARVESTED LEFT RADIAL ARTERY AND ENDOSCOPIC HARVESTED RIGHT GREATER SAPHENOUS VEIN (N/A) COX MAZE PROCEDURE (N/A) CLIPPING, LEFT ATRIAL APPENDAGE (N/A) ECHOCARDIOGRAM, TRANSESOPHAGEAL, INTRAOPERATIVE (N/A) SURGICAL PROCUREMENT, ARTERY, RADIAL Subjective: Feels better today, nausea improved as is pain  Objective: Vital signs in last 24 hours: Temp:  [98.2 F (36.8 C)-99.1 F (37.3 C)] 98.5 F (36.9 C) (07/16 0730) Pulse Rate:  [67-82] 74 (07/16 0830) Cardiac Rhythm: A-V Sequential paced (07/16 0800) Resp:  [11-28] 13 (07/16 0830) BP: (78-153)/(50-69) 131/56 (07/16 0830) SpO2:  [85 %-96 %] 91 % (07/16 0830) Arterial Line BP: (144-187)/(50-68) 187/68 (07/15 1015) Weight:  [99.3 kg] 99.3 kg (07/16 0500)  Hemodynamic parameters for last 24 hours: PAP: (28-33)/(13-19) 33/19 CVP:  [10 mmHg-13 mmHg] 13 mmHg  Intake/Output from previous day: 07/15 0701 - 07/16 0700 In: 2694.1 [P.O.:2160; I.V.:134.1; IV Piggyback:400.1] Out: 8214 [Lmpwz:8554; Emesis/NG output:300; Chest Tube:40] Intake/Output this shift: No intake/output data recorded.  General appearance: alert, cooperative, and no distress Neurologic: intact Heart: regular rate and rhythm Lungs: diminished breath sounds left base Abdomen: normal findings: soft, non-tender  Lab Results: Recent Labs    01/07/24 0450 01/08/24 0404  WBC 8.8 10.0  HGB 8.2* 7.3*  HCT 23.2* 21.3*  PLT 149* 129*   BMET:  Recent Labs    01/07/24 0450 01/08/24 0404  NA 134* 131*  K 4.0 3.8  CL 105 99  CO2 20* 22  GLUCOSE 104* 152*  BUN 9 10  CREATININE 0.80 0.90  CALCIUM  7.4* 7.6*    PT/INR:  Recent Labs    01/06/24 1623  LABPROT 19.7*  INR 1.6*   ABG    Component Value Date/Time   PHART 7.410 01/06/2024 2231   HCO3 20.3 01/06/2024 2231   TCO2 21 (L) 01/06/2024 2231   ACIDBASEDEF 4.0 (H) 01/06/2024 2231   O2SAT 98  01/06/2024 2231   CBG (last 3)  Recent Labs    01/07/24 2336 01/08/24 0330 01/08/24 0736  GLUCAP 153* 157* 157*    Assessment/Plan: S/P Procedure(s) (LRB): CORONARY ARTERY BYPASS GRAFTING X 3, USING LEFT INTERNAL MAMMARY ARTERY, HARVESTED LEFT RADIAL ARTERY AND ENDOSCOPIC HARVESTED RIGHT GREATER SAPHENOUS VEIN (N/A) COX MAZE PROCEDURE (N/A) CLIPPING, LEFT ATRIAL APPENDAGE (N/A) ECHOCARDIOGRAM, TRANSESOPHAGEAL, INTRAOPERATIVE (N/A) SURGICAL PROCUREMENT, ARTERY, RADIAL POD # 2 NEURO- intact CV- in SR  Will dc pacing wires  ASA, start Plavix  tomorrow  Statin  Beta blocker, ARB RESP- continue IS RENAL- BUN and creatinine normal  Continue diuresis ENDO- CBG mildly elevated  Resume metformin   Change SSI to Ac and HS GI- nausea improved  Diet as tolerated Anemia secondary to ABL- Hgb to 7.3- monitor SCD + enoxaparin  Continue cardiac rehab Transfer to 4E  LOS: 5 days    Daniel Holden 01/08/2024

## 2024-01-09 LAB — ECHO INTRAOPERATIVE TEE
Height: 66 in
Weight: 3440 [oz_av]

## 2024-01-09 LAB — GLUCOSE, CAPILLARY
Glucose-Capillary: 136 mg/dL — ABNORMAL HIGH (ref 70–99)
Glucose-Capillary: 149 mg/dL — ABNORMAL HIGH (ref 70–99)
Glucose-Capillary: 128 mg/dL — ABNORMAL HIGH (ref 70–99)
Glucose-Capillary: 125 mg/dL — ABNORMAL HIGH (ref 70–99)

## 2024-01-09 MED ORDER — LACTULOSE 10 GM/15ML PO SOLN
30.0000 g | Freq: Every day | ORAL | Status: DC | PRN
  Administered 2024-01-09: 30 g via ORAL
  Filled 2024-01-09: qty 45

## 2024-01-09 MED ORDER — ALPRAZOLAM 0.25 MG PO TABS
0.2500 mg | ORAL_TABLET | Freq: Every evening | ORAL | Status: DC | PRN
  Administered 2024-01-09: 0.25 mg via ORAL
  Filled 2024-01-09: qty 1

## 2024-01-09 MED ORDER — GUAIFENESIN-DM 100-10 MG/5ML PO SYRP: 5.0000 mL | ORAL_SOLUTION | ORAL | Status: DC | PRN

## 2024-01-09 MED ORDER — LEVALBUTEROL HCL 0.63 MG/3ML IN NEBU
0.6300 mg | INHALATION_SOLUTION | Freq: Three times a day (TID) | RESPIRATORY_TRACT | Status: DC
  Administered 2024-01-09: 0.63 mg via RESPIRATORY_TRACT
  Filled 2024-01-09: qty 3

## 2024-01-09 MED ORDER — FE FUM-VIT C-VIT B12-FA 460-60-0.01-1 MG PO CAPS
1.0000 | ORAL_CAPSULE | Freq: Every day | ORAL | Status: DC
  Administered 2024-01-09 – 2024-01-10 (×2): 1 via ORAL
  Filled 2024-01-09 (×2): qty 1

## 2024-01-09 MED ORDER — AMLODIPINE BESYLATE 5 MG PO TABS
5.0000 mg | ORAL_TABLET | Freq: Every day | ORAL | Status: DC
  Administered 2024-01-09 – 2024-01-10 (×2): 5 mg via ORAL
  Filled 2024-01-09 (×2): qty 1

## 2024-01-09 MED ORDER — LEVALBUTEROL HCL 0.63 MG/3ML IN NEBU: 0.6300 mg | INHALATION_SOLUTION | Freq: Four times a day (QID) | RESPIRATORY_TRACT | Status: DC | PRN

## 2024-01-09 MED FILL — Potassium Chloride Inj 2 mEq/ML: INTRAVENOUS | Qty: 40 | Status: AC

## 2024-01-09 MED FILL — Heparin Sodium (Porcine) Inj 1000 Unit/ML: INTRAMUSCULAR | Qty: 30 | Status: AC

## 2024-01-09 MED FILL — Mannitol IV Soln 20%: INTRAVENOUS | Qty: 500 | Status: AC

## 2024-01-09 MED FILL — Magnesium Sulfate Inj 50%: INTRAMUSCULAR | Qty: 10 | Status: AC

## 2024-01-09 MED FILL — Lidocaine HCl Local Soln Prefilled Syringe 100 MG/5ML (2%): INTRAMUSCULAR | Qty: 5 | Status: AC

## 2024-01-09 MED FILL — Sodium Bicarbonate IV Soln 8.4%: INTRAVENOUS | Qty: 50 | Status: AC

## 2024-01-09 MED FILL — Heparin Sodium (Porcine) Inj 1000 Unit/ML: Qty: 1000 | Status: AC

## 2024-01-09 MED FILL — Electrolyte-R (PH 7.4) Solution: INTRAVENOUS | Qty: 3000 | Status: AC

## 2024-01-09 MED FILL — Sodium Chloride IV Soln 0.9%: INTRAVENOUS | Qty: 2000 | Status: AC

## 2024-01-09 NOTE — Anesthesia Postprocedure Evaluation (Signed)
 Anesthesia Post Note  Patient: Daniel Holden  Procedure(s) Performed: CORONARY ARTERY BYPASS GRAFTING X 3, USING LEFT INTERNAL MAMMARY ARTERY, HARVESTED LEFT RADIAL ARTERY AND ENDOSCOPIC HARVESTED RIGHT GREATER SAPHENOUS VEIN (Chest) COX MAZE PROCEDURE CLIPPING, LEFT ATRIAL APPENDAGE ECHOCARDIOGRAM, TRANSESOPHAGEAL, INTRAOPERATIVE SURGICAL PROCUREMENT, ARTERY, RADIAL     Patient location during evaluation: SICU Anesthesia Type: General Level of consciousness: sedated Pain management: pain level controlled Vital Signs Assessment: post-procedure vital signs reviewed and stable Respiratory status: patient remains intubated per anesthesia plan Cardiovascular status: stable Postop Assessment: no apparent nausea or vomiting Anesthetic complications: no   No notable events documented.  Last Vitals:  Vitals:   01/09/24 0838 01/09/24 1147  BP: 133/60 (!) 145/69  Pulse: 69 67  Resp: 20 (!) 22  Temp: 36.9 C 36.8 C  SpO2: 96% 97%    Last Pain:  Vitals:   01/09/24 1147  TempSrc: Oral  PainSc:                  Jani Moronta S

## 2024-01-09 NOTE — Progress Notes (Signed)
 Mobility Specialist Progress Note:   01/09/24 1217  Mobility  Activity Ambulated with assistance in hallway;Transferred from chair to bed  Level of Assistance Contact guard assist, steadying assist  Assistive Device Front wheel walker  Distance Ambulated (ft) 100 ft  Activity Response Tolerated well  Mobility Referral Yes  Mobility visit 1 Mobility  Mobility Specialist Start Time (ACUTE ONLY) 1129  Mobility Specialist Stop Time (ACUTE ONLY) 1151  Mobility Specialist Time Calculation (min) (ACUTE ONLY) 22 min   Received pt in bathroom and eager for mobility. Found pt on 2L/ min. C/o dizziness, took BP (see below). Pt ambulated on 2L/ min, VSS. Returned to room and c/o dizziness, took another BP. Left pt in chair with personal belongings and call light within reach. All needs met.   Pre Mobility: (EOB) BP: 137/ 69 (86) Standing Mobility: BP: 129/ 62 (82) Post Mobility: (in chair) BP: 145/ 69 (61)  Edin Kon Mobility Specialist  Please contact via [employer] or  Rehab Office [phone]

## 2024-01-09 NOTE — Care Management Important Message (Signed)
 Important Message  Patient Details  Name: Daniel Holden MRN: [id number] Date of Birth: August 26, 1945   Important Message Given:  Yes - Medicare IM     Claretta Deed 01/09/2024, 2:53 PM

## 2024-01-09 NOTE — Progress Notes (Signed)
 RN called into pt room by NA, Lonell. She noticed the pt had a swollen area above left clavicle. Central line previously on right side of neck, not left. Pt reported that this was not a normal occurrence for him. PA, Lemond Cera, paged. Gold stated he would be up on the floor at some point to look at it. Will continue to monitor.

## 2024-01-09 NOTE — Progress Notes (Signed)
 CARDIAC REHAB PHASE I   PRE:  Rate/Rhythm: 73 SR    BP: sitting 145/65    SpO2: 98 2L  MODE:  Ambulation: 370 ft   POST:  Rate/Rhythm: 84 SR    BP: sitting 185/68     SpO2: 95 2L, 95 RA in recliner   Pt tolerated well with RW, standby assist. He c/o back pain with distance and needed to sit and rest for 2 min. He is wheezing with quick exertion but SpO2 stable on 2L. D/c'd O2 after walk in recliner. RN aware. Moving appropriately following sternal precautions. Encouraged x2 more walks and IS today. Would benefit from Mobility Team. [phone]  Aliene Aris BS, ACSM-CEP 01/09/2024 10:57 AM

## 2024-01-09 NOTE — Progress Notes (Addendum)
 3 Days Post-Op Procedure(s) (LRB): CORONARY ARTERY BYPASS GRAFTING X 3, USING LEFT INTERNAL MAMMARY ARTERY, HARVESTED LEFT RADIAL ARTERY AND ENDOSCOPIC HARVESTED RIGHT GREATER SAPHENOUS VEIN (N/A) COX MAZE PROCEDURE (N/A) CLIPPING, LEFT ATRIAL APPENDAGE (N/A) ECHOCARDIOGRAM, TRANSESOPHAGEAL, INTRAOPERATIVE (N/A) SURGICAL PROCUREMENT, ARTERY, RADIAL Subjective: Had a bad night, + cough  Objective: Vital signs in last 24 hours: Temp:  [98.3 F (36.8 C)-99.1 F (37.3 C)] 99.1 F (37.3 C) (07/17 0321) Pulse Rate:  [56-82] 75 (07/17 0508) Cardiac Rhythm: Normal sinus rhythm (07/17 0410) Resp:  [13-25] 20 (07/17 0321) BP: (103-153)/(56-91) 103/91 (07/17 0321) SpO2:  [90 %-100 %] 96 % (07/17 0508) Weight:  [100.2 kg] 100.2 kg (07/17 0508)  Hemodynamic parameters for last 24 hours:    Intake/Output from previous day: 07/16 0701 - 07/17 0700 In: 350 [P.O.:350] Out: 900 [Urine:900] Intake/Output this shift: No intake/output data recorded.  General appearance: alert, cooperative, and no distress Heart: regular rate and rhythm Lungs: +  exp wheeze Abdomen: active BS, soft, non tender Extremities: no edema Wound: incis dressed, Left hand N/V intact  Lab Results: Recent Labs    01/07/24 0450 01/08/24 0404  WBC 8.8 10.0  HGB 8.2* 7.3*  HCT 23.2* 21.3*  PLT 149* 129*   BMET:  Recent Labs    01/07/24 0450 01/08/24 0404  NA 134* 131*  K 4.0 3.8  CL 105 99  CO2 20* 22  GLUCOSE 104* 152*  BUN 9 10  CREATININE 0.80 0.90  CALCIUM  7.4* 7.6*    PT/INR:  Recent Labs    01/06/24 1623  LABPROT 19.7*  INR 1.6*   ABG    Component Value Date/Time   PHART 7.410 01/06/2024 2231   HCO3 20.3 01/06/2024 2231   TCO2 21 (L) 01/06/2024 2231   ACIDBASEDEF 4.0 (H) 01/06/2024 2231   O2SAT 98 01/06/2024 2231   CBG (last 3)  Recent Labs    01/08/24 1622 01/08/24 2114 01/09/24 0619  GLUCAP 161* 125* 136*    Meds Scheduled Meds:  acetaminophen   1,000 mg Oral Q6H    aspirin  EC  81 mg Oral Daily   atorvastatin   80 mg Oral Daily   bisacodyl   10 mg Oral Daily   Chlorhexidine  Gluconate Cloth  6 each Topical Daily   clopidogrel   75 mg Oral Daily   docusate sodium   200 mg Oral Daily   enoxaparin  (LOVENOX ) injection  40 mg Subcutaneous QHS   furosemide   40 mg Oral Daily   insulin  aspart  0-20 Units Subcutaneous TID WC   insulin  aspart  0-5 Units Subcutaneous QHS   isosorbide  mononitrate  30 mg Oral Daily   melatonin  10 mg Oral QHS   metFORMIN   500 mg Oral BID WC   metoprolol  tartrate  25 mg Oral BID   pantoprazole   40 mg Oral Daily   potassium chloride   20 mEq Oral BID   sodium chloride  flush  3 mL Intravenous Q12H   Continuous Infusions:  albumin  human Stopped (01/07/24 0912)   phenylephrine  (NEO-SYNEPHRINE) Adult infusion Stopped (01/07/24 0659)   PRN Meds:.albumin  human, lip balm, metoprolol  tartrate, midazolam , morphine  injection, ondansetron  (ZOFRAN ) IV, mouth rinse, sodium chloride  flush, traMADol   Xrays DG Chest Port 1 View Result Date: 01/08/2024 CLINICAL DATA:  [id number] S/P CABG (coronary artery bypass graft) [id number] EXAM: PORTABLE CHEST - 1 VIEW COMPARISON:  January 07, 2024 FINDINGS: Central pulmonary vascular congestion. Trace left pleural effusion, unchanged, with left basilar atelectasis. No pneumothorax. Cardiomegaly. Sternotomy wires and left atrial appendage clip. Tortuous aorta  with aortic atherosclerosis. Interval removal of the pulmonary artery catheter with persistent introducer sheath in the right SVC. The mediastinal drains and thoracostomy tubes have been removed. IMPRESSION: 1. Mild central pulmonary vascular congestion with trace left pleural effusion. No pneumothorax. 2. Interval removal of tubes and lines, as delineated above. Electronically Signed   By: Rogelia Myers M.D.   On: 01/08/2024 09:27    Assessment/Plan: S/P Procedure(s) (LRB): CORONARY ARTERY BYPASS GRAFTING X 3, USING LEFT INTERNAL MAMMARY ARTERY, HARVESTED LEFT  RADIAL ARTERY AND ENDOSCOPIC HARVESTED RIGHT GREATER SAPHENOUS VEIN (N/A) COX MAZE PROCEDURE (N/A) CLIPPING, LEFT ATRIAL APPENDAGE (N/A) ECHOCARDIOGRAM, TRANSESOPHAGEAL, INTRAOPERATIVE (N/A) SURGICAL PROCUREMENT, ARTERY, RADIAL  POD#3 1 Tmax 99.1, s BP 100's-150's,mostly in good range,  sinus rhythm, occ PVC- ARB and norvasc  have not been restarted, on Imdur  for radial, will change from Imdur  to norvasc  2 O2 sats good on 2 liters [place] 3 voiding- not all recorded, weight about 2.5 KG > preop, diuresing 4 CBG well controlled for inpatient, metformin  resumed, on SSI 5 normal renal fxn, minor hyponatremia, Na++ 131- cont to monitor w/ diuresis 6 expected ABLA- H/H 7.3/21.3- add iron 7 thrombocytopenia- cont to monitor, on plavix , low dose ASA 8 add xopenex  w./ active wheezing, robitussin 9 lovenox   for DVT ppx 10 lactulose  for constipation, has had chronic issues so may need an enema( per Patient) will hold for nor 11 he uses xanax  at home for sleep aide- will resume 12 pulm hygiene and rehab modalities- routine 13 recheck labs in am  LOS: 6 days    Lemond FORBES Cera PA-C Pager [phone] 01/09/2024 Patient seen and examined, agree with above Doing well Plan as outlined  Elspeth C. Kerrin, MD [facility] [phone]

## 2024-01-10 LAB — CBC
HCT: 20.5 % — ABNORMAL LOW (ref 39.0–52.0)
Hemoglobin: 7.2 g/dL — ABNORMAL LOW (ref 13.0–17.0)
MCH: 37.3 pg — ABNORMAL HIGH (ref 26.0–34.0)
MCHC: 35.1 g/dL (ref 30.0–36.0)
MCV: 106.2 fL — ABNORMAL HIGH (ref 80.0–100.0)
Platelets: 204 K/uL (ref 150–400)
RBC: 1.93 MIL/uL — ABNORMAL LOW (ref 4.22–5.81)
RDW: 11.9 % (ref 11.5–15.5)
WBC: 10.4 K/uL (ref 4.0–10.5)
nRBC: 1.4 % — ABNORMAL HIGH (ref 0.0–0.2)

## 2024-01-10 LAB — BASIC METABOLIC PANEL WITH GFR
Anion gap: 10 (ref 5–15)
BUN: 14 mg/dL (ref 8–23)
CO2: 21 mmol/L — ABNORMAL LOW (ref 22–32)
Calcium: 8.2 mg/dL — ABNORMAL LOW (ref 8.9–10.3)
Chloride: 99 mmol/L (ref 98–111)
Creatinine, Ser: 0.86 mg/dL (ref 0.61–1.24)
GFR, Estimated: >60 mL/min (ref >60)
Glucose, Bld: 141 mg/dL — ABNORMAL HIGH (ref 70–99)
Potassium: 3.5 mmol/L (ref 3.5–5.1)
Sodium: 130 mmol/L — ABNORMAL LOW (ref 135–145)

## 2024-01-10 LAB — GLUCOSE, CAPILLARY: Glucose-Capillary: 136 mg/dL — ABNORMAL HIGH (ref 70–99)

## 2024-01-10 MED ORDER — METOPROLOL TARTRATE 25 MG PO TABS: 25.0000 mg | ORAL_TABLET | Freq: Two times a day (BID) | ORAL | 1 refills | Status: DC

## 2024-01-10 MED ORDER — FE FUM-VIT C-VIT B12-FA 460-60-0.01-1 MG PO CAPS: 1.0000 | ORAL_CAPSULE | Freq: Every day | ORAL | 2 refills | Status: DC

## 2024-01-10 MED ORDER — CLOPIDOGREL BISULFATE 75 MG PO TABS: 75.0000 mg | ORAL_TABLET | Freq: Every day | ORAL | 1 refills | Status: DC

## 2024-01-10 MED ORDER — TRAMADOL HCL 50 MG PO TABS
50.0000 mg | ORAL_TABLET | Freq: Four times a day (QID) | ORAL | 0 refills | Status: AC | PRN
Start: 2024-01-10 — End: 2024-01-17

## 2024-01-10 MED ORDER — LOSARTAN POTASSIUM 25 MG PO TABS: 25.0000 mg | ORAL_TABLET | Freq: Every day | ORAL | 1 refills | Status: DC

## 2024-01-10 NOTE — Progress Notes (Signed)
 4 Days Post-Op Procedure(s) (LRB): CORONARY ARTERY BYPASS GRAFTING X 3, USING LEFT INTERNAL MAMMARY ARTERY, HARVESTED LEFT RADIAL ARTERY AND ENDOSCOPIC HARVESTED RIGHT GREATER SAPHENOUS VEIN (N/A) COX MAZE PROCEDURE (N/A) CLIPPING, LEFT ATRIAL APPENDAGE (N/A) ECHOCARDIOGRAM, TRANSESOPHAGEAL, INTRAOPERATIVE (N/A) SURGICAL PROCUREMENT, ARTERY, RADIAL Subjective: Feels well, + BM  Objective: Vital signs in last 24 hours: Temp:  [98.2 F (36.8 C)-98.7 F (37.1 C)] 98.7 F (37.1 C) (07/18 0331) Pulse Rate:  [66-74] 74 (07/18 0331) Cardiac Rhythm: Normal sinus rhythm (07/17 2053) Resp:  [19-21] 21 (07/18 0612) BP: (124-145)/(60-71) 127/71 (07/18 0331) SpO2:  [96 %-100 %] 100 % (07/18 0331) Weight:  [98.7 kg] 98.7 kg (07/18 0612)  Hemodynamic parameters for last 24 hours:    Intake/Output from previous day: 07/17 0701 - 07/18 0700 In: 483 [P.O.:480; I.V.:3] Out: 600 [Urine:600] Intake/Output this shift: No intake/output data recorded.  General appearance: alert, cooperative, and no distress Heart: regular rate and rhythm Lungs: clear to auscultation bilaterally Abdomen: benign Extremities: no edema Wound: incis healing well, left hand N/V intact  Lab Results: Recent Labs    01/08/24 0404 01/10/24 0242  WBC 10.0 10.4  HGB 7.3* 7.2*  HCT 21.3* 20.5*  PLT 129* 204   BMET:  Recent Labs    01/08/24 0404 01/10/24 0242  NA 131* 130*  K 3.8 3.5  CL 99 99  CO2 22 21*  GLUCOSE 152* 141*  BUN 10 14  CREATININE 0.90 0.86  CALCIUM  7.6* 8.2*    PT/INR: No results for input(s): LABPROT, INR in the last 72 hours. ABG    Component Value Date/Time   PHART 7.410 01/06/2024 2231   HCO3 20.3 01/06/2024 2231   TCO2 21 (L) 01/06/2024 2231   ACIDBASEDEF 4.0 (H) 01/06/2024 2231   O2SAT 98 01/06/2024 2231   CBG (last 3)  Recent Labs    01/09/24 1647 01/09/24 2117 01/10/24 0615  GLUCAP 128* 125* 136*    Meds Scheduled Meds:  acetaminophen   1,000 mg Oral Q6H    amLODipine   5 mg Oral Daily   aspirin  EC  81 mg Oral Daily   atorvastatin   80 mg Oral Daily   bisacodyl   10 mg Oral Daily   clopidogrel   75 mg Oral Daily   docusate sodium   200 mg Oral Daily   enoxaparin  (LOVENOX ) injection  40 mg Subcutaneous QHS   Fe Fum-Vit C-Vit B12-FA  1 capsule Oral QPC breakfast   furosemide   40 mg Oral Daily   insulin  aspart  0-20 Units Subcutaneous TID WC   insulin  aspart  0-5 Units Subcutaneous QHS   melatonin  10 mg Oral QHS   metFORMIN   500 mg Oral BID WC   metoprolol  tartrate  25 mg Oral BID   pantoprazole   40 mg Oral Daily   potassium chloride   20 mEq Oral BID   sodium chloride  flush  3 mL Intravenous Q12H   Continuous Infusions:  albumin  human Stopped (01/07/24 0912)   phenylephrine  (NEO-SYNEPHRINE) Adult infusion Stopped (01/07/24 0659)   PRN Meds:.albumin  human, ALPRAZolam , guaiFENesin -dextromethorphan, lactulose , levalbuterol , lip balm, metoprolol  tartrate, morphine  injection, ondansetron  (ZOFRAN ) IV, mouth rinse, sodium chloride  flush, traMADol   Xrays No results found.  Assessment/Plan: S/P Procedure(s) (LRB): CORONARY ARTERY BYPASS GRAFTING X 3, USING LEFT INTERNAL MAMMARY ARTERY, HARVESTED LEFT RADIAL ARTERY AND ENDOSCOPIC HARVESTED RIGHT GREATER SAPHENOUS VEIN (N/A) COX MAZE PROCEDURE (N/A) CLIPPING, LEFT ATRIAL APPENDAGE (N/A) ECHOCARDIOGRAM, TRANSESOPHAGEAL, INTRAOPERATIVE (N/A) SURGICAL PROCUREMENT, ARTERY, RADIAL POD#4  1 afeb, s BP 120's-140's, NSR will resume losartan  1/2 dose  2 sats ok on RA, CPAP at night, no further wheezing 3 voiding, not all measured, weight about 1 kg >preop, will stop diuretics 4 BS adeq control on metformin , SSI 5 hyponatremia fairly stable , sodium 130 6 normal renal fxn 7 ABLA remains stable, low, HGB 7.2- on Iron/B12, macrocytic component 8 thrombocytopenia has resolved 9 looks and feels well, will discuss w/ MD, poss d/c today or tomorrow      LOS: 7 days    Lemond FORBES Cera PA-C Pager [phone]  [phone] 01/10/2024

## 2024-01-10 NOTE — TOC Transition Note (Signed)
 Transition of Care (TOC) - Discharge Note Rayfield Gobble RN, BSN Inpatient Care Management Unit 4E- RN Case Manager See Treatment Team for direct phone #    Patient Details  Name: Daniel Holden MRN: [id number] Date of Birth: [birth year]  [facility]) CM/SW Contact:  Gobble Rayfield Hurst, RN Phone Number: 01/10/2024, 12:08 PM   Clinical Narrative:    Pt stable for transition home today, Spouse to transport home.   No HH or DME needs noted. Pt will f/u as per AVS instructions.   No further CM needs noted.    Final next level of care: Home/Self Care Barriers to Discharge: Barriers Resolved   Patient Goals and CMS Choice Patient states their goals for this hospitalization and ongoing recovery are:: Plan to return home once stable.   Choice offered to / list presented to : NA      Discharge Placement             Home          Discharge Plan and Services Additional resources added to the After Visit Summary for   In-house Referral: NA Discharge Planning Services: CM Consult Post Acute Care Choice: Home Health          DME Arranged: N/A DME Agency: NA       HH Arranged: NA HH Agency: NA        Social Drivers of Health (SDOH) Interventions SDOH Screenings   Food Insecurity: No Food Insecurity (01/03/2024)  Housing: Low Risk  (01/03/2024)  Transportation Needs: No Transportation Needs (01/03/2024)  Utilities: Not At Risk (01/03/2024)  Depression (PHQ2-9): Low Risk  (05/26/2021)  Financial Resource Strain: Low Risk  (12/23/2023)   Received from [facility]  Social Connections: Moderately Integrated (01/03/2024)  Tobacco Use: Low Risk  (01/06/2024)  Recent Concern: Tobacco Use - Medium Risk (12/23/2023)   Received from [facility]     Readmission Risk Interventions     No data to display

## 2024-01-10 NOTE — Progress Notes (Signed)
 Discharge instructions reviewed with pt and his wife.   Copy of instructions given to pt. Pt informed his scripts were sent to his pharmacy for pick up.  Pt will  bed/c'd via wheelchair with belongings and will be escorted by staff/hospital volunteer.   Pilar Westergaard,RN SWOT

## 2024-01-10 NOTE — Progress Notes (Addendum)
 Discussed with pt and wife IS, sternal precautions, diet, exercise, and CRPII. Pt receptive. Will refer to [facility] CRPII.  [id number] Aliene Aris BS, ACSM-CEP 01/10/2024 10:18 AM

## 2024-01-10 NOTE — Plan of Care (Signed)
 Problem: Education: Goal: Knowledge of General Education information will improve Description: Including pain rating scale, medication(s)/side effects and non-pharmacologic comfort measures 01/10/2024 0611 by Firman Alpha, RN Outcome: Progressing 01/10/2024 0611 by Firman Alpha, RN Outcome: Progressing   Problem: Health Behavior/Discharge Planning: Goal: Ability to manage health-related needs will improve 01/10/2024 0611 by Firman Alpha, RN Outcome: Progressing 01/10/2024 0611 by Firman Alpha, RN Outcome: Progressing   Problem: Clinical Measurements: Goal: Ability to maintain clinical measurements within normal limits will improve 01/10/2024 0611 by Firman Alpha, RN Outcome: Progressing 01/10/2024 0611 by Firman Alpha, RN Outcome: Progressing Goal: Will remain free from infection 01/10/2024 0611 by Firman Alpha, RN Outcome: Progressing 01/10/2024 0611 by Firman Alpha, RN Outcome: Progressing Goal: Diagnostic test results will improve 01/10/2024 0611 by Firman Alpha, RN Outcome: Progressing 01/10/2024 0611 by Firman Alpha, RN Outcome: Progressing Goal: Cardiovascular complication will be avoided 01/10/2024 0611 by Firman Alpha, RN Outcome: Progressing 01/10/2024 0611 by Firman Alpha, RN Outcome: Progressing   Problem: Activity: Goal: Risk for activity intolerance will decrease 01/10/2024 0611 by Firman Alpha, RN Outcome: Progressing 01/10/2024 0611 by Firman Alpha, RN Outcome: Progressing   Problem: Nutrition: Goal: Adequate nutrition will be maintained 01/10/2024 0611 by Firman Alpha, RN Outcome: Progressing 01/10/2024 0611 by Firman Alpha, RN Outcome: Progressing   Problem: Coping: Goal: Level of anxiety will decrease 01/10/2024 0611 by Firman Alpha, RN Outcome: Progressing 01/10/2024 0611 by Firman Alpha, RN Outcome: Progressing   Problem: Elimination: Goal: Will  not experience complications related to bowel motility 01/10/2024 0611 by Firman Alpha, RN Outcome: Progressing 01/10/2024 0611 by Firman Alpha, RN Outcome: Progressing Goal: Will not experience complications related to urinary retention 01/10/2024 0611 by Firman Alpha, RN Outcome: Progressing 01/10/2024 0611 by Firman Alpha, RN Outcome: Progressing   Problem: Pain Managment: Goal: General experience of comfort will improve and/or be controlled 01/10/2024 0611 by Firman Alpha, RN Outcome: Progressing 01/10/2024 0611 by Firman Alpha, RN Outcome: Progressing   Problem: Safety: Goal: Ability to remain free from injury will improve 01/10/2024 0611 by Firman Alpha, RN Outcome: Progressing 01/10/2024 0611 by Firman Alpha, RN Outcome: Progressing   Problem: Skin Integrity: Goal: Risk for impaired skin integrity will decrease 01/10/2024 0611 by Firman Alpha, RN Outcome: Progressing 01/10/2024 0611 by Firman Alpha, RN Outcome: Progressing   Problem: Education: Goal: Understanding of cardiac disease, CV risk reduction, and recovery process will improve 01/10/2024 0611 by Firman Alpha, RN Outcome: Progressing 01/10/2024 0611 by Firman Alpha, RN Outcome: Progressing Goal: Individualized Educational Video(s) 01/10/2024 9388 by Firman Alpha, RN Outcome: Progressing 01/10/2024 0611 by Firman Alpha, RN Outcome: Progressing   Problem: Activity: Goal: Ability to tolerate increased activity will improve 01/10/2024 0611 by Firman Alpha, RN Outcome: Progressing 01/10/2024 0611 by Firman Alpha, RN Outcome: Progressing   Problem: Cardiac: Goal: Ability to achieve and maintain adequate cardiovascular perfusion will improve 01/10/2024 0611 by Firman Alpha, RN Outcome: Progressing 01/10/2024 0611 by Firman Alpha, RN Outcome: Progressing   Problem: Health Behavior/Discharge Planning: Goal:  Ability to safely manage health-related needs after discharge will improve 01/10/2024 0611 by Firman Alpha, RN Outcome: Progressing 01/10/2024 0611 by Firman Alpha, RN Outcome: Progressing   Problem: Education: Goal: Will demonstrate proper wound care and an understanding of methods to prevent future damage 01/10/2024 0611 by Firman Alpha, RN Outcome: Progressing 01/10/2024 0611 by Firman Alpha, RN Outcome: Progressing Goal: Knowledge of disease or condition will improve 01/10/2024 0611 by Firman Alpha, RN Outcome: Progressing 01/10/2024 0611 by Firman Alpha, RN Outcome: Progressing Goal: Knowledge of the prescribed therapeutic regimen  will improve 01/10/2024 0611 by Firman Alpha, RN Outcome: Progressing 01/10/2024 0611 by Firman Alpha, RN Outcome: Progressing Goal: Individualized Educational Video(s) 01/10/2024 0611 by Firman Alpha, RN Outcome: Progressing 01/10/2024 0611 by Firman Alpha, RN Outcome: Progressing   Problem: Activity: Goal: Risk for activity intolerance will decrease 01/10/2024 0611 by Firman Alpha, RN Outcome: Progressing 01/10/2024 0611 by Firman Alpha, RN Outcome: Progressing   Problem: Cardiac: Goal: Will achieve and/or maintain hemodynamic stability 01/10/2024 0611 by Firman Alpha, RN Outcome: Progressing 01/10/2024 0611 by Firman Alpha, RN Outcome: Progressing   Problem: Clinical Measurements: Goal: Postoperative complications will be avoided or minimized 01/10/2024 0611 by Firman Alpha, RN Outcome: Progressing 01/10/2024 0611 by Firman Alpha, RN Outcome: Progressing   Problem: Respiratory: Goal: Respiratory status will improve 01/10/2024 0611 by Firman Alpha, RN Outcome: Progressing 01/10/2024 0611 by Firman Alpha, RN Outcome: Progressing   Problem: Skin Integrity: Goal: Wound healing without signs and symptoms of infection 01/10/2024 0611 by  Firman Alpha, RN Outcome: Progressing 01/10/2024 0611 by Firman Alpha, RN Outcome: Progressing Goal: Risk for impaired skin integrity will decrease 01/10/2024 0611 by Firman Alpha, RN Outcome: Progressing 01/10/2024 0611 by Firman Alpha, RN Outcome: Progressing   Problem: Urinary Elimination: Goal: Ability to achieve and maintain adequate renal perfusion and functioning will improve 01/10/2024 0611 by Firman Alpha, RN Outcome: Progressing 01/10/2024 0611 by Firman Alpha, RN Outcome: Progressing   Problem: Education: Goal: Ability to describe self-care measures that may prevent or decrease complications (Diabetes Survival Skills Education) will improve 01/10/2024 0611 by Firman Alpha, RN Outcome: Progressing 01/10/2024 0611 by Firman Alpha, RN Outcome: Progressing Goal: Individualized Educational Video(s) 01/10/2024 0611 by Firman Alpha, RN Outcome: Progressing 01/10/2024 0611 by Firman Alpha, RN Outcome: Progressing   Problem: Coping: Goal: Ability to adjust to condition or change in health will improve 01/10/2024 0611 by Firman Alpha, RN Outcome: Progressing 01/10/2024 0611 by Firman Alpha, RN Outcome: Progressing   Problem: Fluid Volume: Goal: Ability to maintain a balanced intake and output will improve 01/10/2024 0611 by Firman Alpha, RN Outcome: Progressing 01/10/2024 0611 by Firman Alpha, RN Outcome: Progressing   Problem: Health Behavior/Discharge Planning: Goal: Ability to identify and utilize available resources and services will improve 01/10/2024 0611 by Firman Alpha, RN Outcome: Progressing 01/10/2024 0611 by Firman Alpha, RN Outcome: Progressing Goal: Ability to manage health-related needs will improve 01/10/2024 0611 by Firman Alpha, RN Outcome: Progressing 01/10/2024 0611 by Firman Alpha, RN Outcome: Progressing   Problem: Metabolic: Goal: Ability to  maintain appropriate glucose levels will improve 01/10/2024 0611 by Firman Alpha, RN Outcome: Progressing 01/10/2024 0611 by Firman Alpha, RN Outcome: Progressing   Problem: Nutritional: Goal: Maintenance of adequate nutrition will improve 01/10/2024 0611 by Firman Alpha, RN Outcome: Progressing 01/10/2024 0611 by Firman Alpha, RN Outcome: Progressing Goal: Progress toward achieving an optimal weight will improve 01/10/2024 0611 by Firman Alpha, RN Outcome: Progressing 01/10/2024 0611 by Firman Alpha, RN Outcome: Progressing   Problem: Skin Integrity: Goal: Risk for impaired skin integrity will decrease 01/10/2024 0611 by Firman Alpha, RN Outcome: Progressing 01/10/2024 0611 by Firman Alpha, RN Outcome: Progressing   Problem: Tissue Perfusion: Goal: Adequacy of tissue perfusion will improve 01/10/2024 0611 by Firman Alpha, RN Outcome: Progressing 01/10/2024 0611 by Firman Alpha, RN Outcome: Progressing

## 2024-01-28 DIAGNOSIS — D0462 Carcinoma in situ of skin of left upper limb, including shoulder: Secondary | ICD-10-CM | POA: Diagnosis not present

## 2024-02-02 NOTE — Progress Notes (Unsigned)
 Cardiology Office Note:    Date:  02/03/2024   ID:  Daniel Holden, DOB [birth year], MRN [id number]  PCP:  Sherial Bail, MD   [facility] Providers Cardiologist:  Wilbert Bihari, MD     Referring MD: Sherial Bail, MD   Follow-up post CABG  History of Present Illness:    Daniel Holden is a 78 y.o. male with a hx of atrial fibrillation s/p MAZE and LAA clipping, NSTEMI, hypertension, CAD, OSA, GERD, T2DM, IDA, CABG X3 (01/06/24) (LIMA-LAD, SVG-PL, and left radial-OM) presents to the office today for his first follow-up post-CABG. Patient was referred to cardiology initially in 2022 for chest pain, coronary calcium  score of 4630, cardiac cath at that time showed diffuse disease with extensive calcification throughout, no targets for stenting were placed at that time, GDMT was recommended.  Echo in 2023 showed LVEF 55-60%, no RWMA, G1 DD, normal RV.  Last seen in the office 08/2023 reporting worsening shortness of breath and unstable angina, no interventions at that time.  On 01/01/2024 patient presented to [facility] with chest pain that had persisted over 3 days.  Patient noticed his pulse was irregular at that time, EMS was called and administered aspirin  and nitroglycerin , patient was found to be in A-fib with rates 90s-120s.  High sensitivity troponin was 125 / 1,199 / 5,240.  Patient underwent cath, CTO of RCA, 95% LAD and 90% circumflex stenoses.  Bifurcated LAD with proximal septal perforator branch and larger anterior apical branch.  Echo showed normal EF with no significant valvular pathology.  The patient and his studies were reviewed by Dr. Kerrin who recommended proceeding with CABG.  01/06/2024 patient was taken to the operating room where he underwent CABG X3, clipping of the left atrial appendage, and MAZE procedure.  Postoperative TEE showed normal LV systolic function, LVEF 55-60%.  No increase in left ventricular wall thickness, normal RV, normal aortic valve.  Patient tolerated the procedure well and was taken to surgical ICU in stable condition.  During that time he remained hemodynamically stable and in normal sinus rhythm.  Patient was started on aspirin  and Plavix  following the removal of pacing wires.  He was also started on a beta-blocker, statin, and ARB at discharge.  During patient visit in office, patient appears to be doing well. EKG in office is normal sinus rhythm, 77 bpm, with a nonspecific T wave abnormality in leads III, aVF, V6, with no significant ST changes. States he initially had some difficulty regaining his appetite following the surgery, but states he has been eating small meals and it is slowly returning.  Initially, reports some difficulties with sleep due to soreness at the site of the sternotomy; however, he reports that is now improved.  Patient states he gets up and walks around his neighborhood for about 30 minutes every morning, reports his endurance is increasing.  Surgical sites appear to be healing well, wound edges well-approximated, no visible redness, swelling, warmth, drainage of the surgical scars to the chest, left wrist, right posterior knee.  2 sutures remain intact in the epigastric area, small amount of redness exists at the tissue around the insertion site likely due to mild tissue irritation, no swelling, drainage, does not appear indicative of infectious etiology. Patient denies any chest pain, palpitations, nausea, vomiting, blood in stool, leg swelling. He does report occasional dyspnea on exertion beginning following his surgery, states this is improving with his endurance.  Also reports he had a history of orthostatic dizziness with positional changes  prior to surgery that he feels has since been exacerbated following surgery.  States he's conscientious of the dizziness and gives himself plenty of time during positional changes for it to pass, denies feelings of near syncope or any syncopal episodes. Reports he is able  to do moderate work around [employer], including dishes, laundry, grocery shopping, but states he has been mindful of his lifting restrictions following surgery.  States he feels as though he is ready to begin cardiac rehab.  Patient has been checking his blood pressures at home, states those have been maintaining well.    Past Medical History:  Diagnosis Date   Allergy    Angina of effort (HCC)    Anxiety    CAD (coronary artery disease), native coronary artery    coronary CTA showing aortic atherosclerosis with very high coronary Ca score at 4630 with severely calcified RCA and LAD > 70% and 50-69% prox to mid LCx.     Cancer (HCC)    Carpal tunnel syndrome    GERD (gastroesophageal reflux disease)    HLD (hyperlipidemia)    Hypertension    Lung nodule    Obesity    Pancreatitis 2015   Sleep apnea     Past Surgical History:  Procedure Laterality Date   CARPAL TUNNEL RELEASE     CLIPPING OF ATRIAL APPENDAGE N/A 01/06/2024   Procedure: CLIPPING, LEFT ATRIAL APPENDAGE;  Surgeon: Kerrin Elspeth BROCKS, MD;  Location: [facility] OR;  Service: Open Heart Surgery;  Laterality: N/A;   COLONOSCOPY WITH PROPOFOL  N/A 06/27/2021   Procedure: COLONOSCOPY WITH PROPOFOL ;  Surgeon: Maryruth Ole DASEN, MD;  Location: [facility] ENDOSCOPY;  Service: Endoscopy;  Laterality: N/A;   CORONARY ARTERY BYPASS GRAFT N/A 01/06/2024   Procedure: CORONARY ARTERY BYPASS GRAFTING X 3, USING LEFT INTERNAL MAMMARY ARTERY, HARVESTED LEFT RADIAL ARTERY AND ENDOSCOPIC HARVESTED RIGHT GREATER SAPHENOUS VEIN;  Surgeon: Kerrin Elspeth BROCKS, MD;  Location: [facility] OR;  Service: Open Heart Surgery;  Laterality: N/A;   ESOPHAGOGASTRODUODENOSCOPY (EGD) WITH PROPOFOL  N/A 06/27/2021   Procedure: ESOPHAGOGASTRODUODENOSCOPY (EGD) WITH PROPOFOL ;  Surgeon: Maryruth Ole DASEN, MD;  Location: [facility] ENDOSCOPY;  Service: Endoscopy;  Laterality: N/A;   ETHMOIDECTOMY Right 03/20/2021   Procedure: ANTERIOR ETHMOIDECTOMY;  Surgeon: Blair Mt, MD;   Location: [facility] ORS;  Service: ENT;  Laterality: Right;   IMAGE GUIDED SINUS SURGERY N/A 03/20/2021   Procedure: IMAGE GUIDED SINUS SURGERY;  Surgeon: Blair Mt, MD;  Location: [facility] ORS;  Service: ENT;  Laterality: N/A;   INTRAOPERATIVE TRANSESOPHAGEAL ECHOCARDIOGRAM N/A 01/06/2024   Procedure: ECHOCARDIOGRAM, TRANSESOPHAGEAL, INTRAOPERATIVE;  Surgeon: Kerrin Elspeth BROCKS, MD;  Location: [facility] OR;  Service: Open Heart Surgery;  Laterality: N/A;   KNEE ARTHROSCOPY WITH MEDIAL MENISECTOMY Left 06/25/2019   Procedure: KNEE ARTHROSCOPY WITH MEDIAL MENISECTOMY;  Surgeon: Marchia Drivers, MD;  Location: [facility] ORS;  Service: Orthopedics;  Laterality: Left;   LEFT HEART CATH AND CORONARY ANGIOGRAPHY N/A 10/04/2020   Procedure: LEFT HEART CATH AND CORONARY ANGIOGRAPHY;  Surgeon: Anner Alm ORN, MD;  Location[facility] INVASIVE CV LAB;  Service: Cardiovascular;  Laterality: N/A;   LEFT HEART CATH AND CORONARY ANGIOGRAPHY N/A 01/02/2024   Procedure: LEFT HEART CATH AND CORONARY ANGIOGRAPHY;  Surgeon: Anner Alm ORN, MD;  Location: [facility] INVASIVE CV LAB;  Service: Cardiovascular;  Laterality: N/A;   MAXILLARY ANTROSTOMY Right 03/20/2021   Procedure: MAXILLARY ANTROSTOMY WITH TISSUE REMOVAL;  Surgeon: Blair Mt, MD;  Location: [facility] ORS;  Service: ENT;  Laterality: Right;   MAZE N/A 01/06/2024   Procedure:  COX MAZE PROCEDURE;  Surgeon: Kerrin Elspeth BROCKS, MD;  Location: [facility] OR;  Service: Open Heart Surgery;  Laterality: N/A;   RADIAL ARTERY HARVEST  01/06/2024   Procedure: SURGICAL PROCUREMENT, ARTERY, RADIAL;  Surgeon: Kerrin Elspeth BROCKS, MD;  Location: [facility] OR;  Service: Open Heart Surgery;;   SHOULDER SURGERY     TONSILLECTOMY      Current Medications: Current Meds  Medication Sig   ALPRAZolam  (XANAX ) 0.5 MG tablet Take 0.25 mg by mouth at bedtime as needed for sleep.   amLODipine  (NORVASC ) 5 MG tablet TAKE 1 TABLET EVERY DAY   aspirin  81 MG EC tablet Take 81 mg by mouth daily.   atorvastatin   (LIPITOR ) 80 MG tablet TAKE 1 TABLET EVERY DAY   Cholecalciferol  250 MCG (10000 UT) TABS Take 1,000 Units by mouth daily.   cyanocobalamin  1000 MCG tablet Take 1,000 mcg by mouth daily.   Fe Fum-Vit C-Vit B12-FA (TRIGELS-F FORTE) CAPS capsule Take 1 capsule by mouth daily after breakfast.   fluticasone  (FLONASE ) 50 MCG/ACT nasal spray Place 2 sprays into both nostrils daily.    losartan  (COZAAR ) 25 MG tablet Take 1 tablet (25 mg total) by mouth daily.   melatonin 3 MG TABS tablet Take 10 mg by mouth at bedtime.   metFORMIN  (GLUCOPHAGE ) 500 MG tablet Take 500 mg by mouth 2 (two) times daily.   metoprolol  tartrate (LOPRESSOR ) 25 MG tablet Take 1 tablet (25 mg total) by mouth 2 (two) times daily.   pantoprazole  (PROTONIX ) 40 MG tablet Take 40 mg by mouth 2 (two) times daily before a meal.   [DISCONTINUED] clopidogrel  (PLAVIX ) 75 MG tablet Take 1 tablet (75 mg total) by mouth daily.     Allergies:   Metronidazole , Ace inhibitors, and Dilaudid  [hydromorphone  hcl]   Social History   Socioeconomic History   Marital status: Married    Spouse name: Not on file   Number of children: Not on file   Years of education: Not on file   Highest education level: Not on file  Occupational History   Not on file  Tobacco Use   Smoking status: Never   Smokeless tobacco: Never  Vaping Use   Vaping status: Never Used  Substance and Sexual Activity   Alcohol use: Yes    Alcohol/week: 42.0 standard drinks of alcohol    Types: 42 Cans of beer per week    Comment: 6 per day   Drug use: No   Sexual activity: Not on file  Other Topics Concern   Not on file  Social History Narrative   Not on file   Social Drivers of Health   Financial Resource Strain: Low Risk  (12/23/2023)   Received from [facility]   Overall Financial Resource Strain (CARDIA)    Difficulty of Paying Living Expenses: Not hard at all  Food Insecurity: No Food Insecurity (01/03/2024)   Hunger Vital Sign    Worried  About Running Out of Food in the Last Year: Never true    Ran Out of Food in the Last Year: Never true  Transportation Needs: No Transportation Needs (01/03/2024)   PRAPARE - Administrator, Civil Service (Medical): No    Lack of Transportation (Non-Medical): No  Physical Activity: Not on file  Stress: Not on file  Social Connections: Moderately Integrated (01/03/2024)   Social Connection and Isolation Panel    Frequency of Communication with Friends and Family: Three times a week    Frequency of Social Gatherings  with Friends and Family: Three times a week    Attends Religious Services: 1 to 4 times per year    Active Member of Clubs or Organizations: No    Attends Banker Meetings: Never    Marital Status: Married     Family History: The patient's family history includes CVA in his father.  ROS:   Please see the history of present illness.    All other systems reviewed and are negative.  EKGs/Labs/Other Studies Reviewed:    The following studies were reviewed today:  EKG Interpretation Date/Time:  Monday February 03 2024 08:15:11 EDT Ventricular Rate:  77 PR Interval:  188 QRS Duration:  92 QT Interval:  358 QTC Calculation: 405 R Axis:   44  Text Interpretation: Normal sinus rhythm Nonspecific T wave abnormality No significant ST changes Confirmed by Janene Boer [phone]) on 02/03/2024 8:40:09 AM    Recent Labs: 01/07/2024: Magnesium  2.1 01/10/2024: BUN 14; Creatinine, Ser 0.86; Hemoglobin 7.2; Platelets 204; Potassium 3.5; Sodium 130  Recent Lipid Panel    Component Value Date/Time   CHOL 119 01/02/2024 0426   CHOL 104 11/07/2020 0741   CHOL 83 09/27/2013 0319   TRIG 132 01/02/2024 0426   TRIG 188 09/27/2013 0319   HDL 60 01/02/2024 0426   HDL 58 11/07/2020 0741   HDL 28 (L) 09/27/2013 0319   CHOLHDL 2.0 01/02/2024 0426   VLDL 26 01/02/2024 0426   VLDL 38 09/27/2013 0319   LDLCALC 33 01/02/2024 0426   LDLCALC 26 11/07/2020 0741   LDLCALC 17  09/27/2013 0319     Risk Assessment/Calculations:    CHA2DS2-VASc Score = 6   This indicates a 9.7% annual risk of stroke. The patient's score is based upon: CHF History: 1 HTN History: 1 Diabetes History: 1 Stroke History: 0 Vascular Disease History: 1 Age Score: 2 Gender Score: 0               Physical Exam:    VS:  BP 120/70   Pulse 77   Ht 5' 6 (1.676 m)   Wt 210 lb (95.3 kg)   SpO2 98%   BMI 33.89 kg/m     Wt Readings from Last 3 Encounters:  02/03/24 210 lb (95.3 kg)  01/10/24 217 lb 9.5 oz (98.7 kg)  01/02/24 214 lb (97.1 kg)    GEN: Well nourished, well developed in no acute distress HEENT: Normal NECK: No carotid bruits CARDIAC: S1-S2 normal, RRR, no murmurs, rubs, gallops, no LE edema RESPIRATORY:  Clear to auscultation without rales, wheezing or rhonchi  ABDOMEN: Soft, non-tender, non-distended MUSCULOSKELETAL:  No edema; No deformity. Surgical scars to chest, left forearm, right posterior knee appear well approximated, no redness, warmth, swelling, bruising, drainage SKIN: Warm and dry NEUROLOGIC:  Alert and oriented x 3 PSYCHIATRIC:  Normal affect   ASSESSMENT:    1. S/P CABG x 3   2. S/P Maze operation for atrial fibrillation   3. S/P left atrial appendage ligation   4. Non-ST elevation (NSTEMI) myocardial infarction (HCC)   5. Essential hypertension   6. Hyperlipidemia, unspecified hyperlipidemia type    PLAN:    In order of problems listed above:  --S/P CABG X3 -EKG findings: normal sinus rhythm, 77 bpm, with a nonspecific T wave abnormality in leads III, aVF, V6, with no significant ST changes. -Overall, patient appears to be recovering well following the surgery. Appetite, sleeping habits, and endurance are all improving. Patient reports strict medication adherence.  -Wound healing at  surgical sites advancing as expected. Will defer suture removal to CVTS team at scheduled follow up in 2 weeks. - Hbg and Sodium both a little low on  hospital discharge, will order a CBC and BMP to be drawn after visit today.   -- S/p maze operation for atrial fibrillation  - NSR in office today - Chadsvasc2 score = 6, does meet criteria for DOAC administration. Considering NSR in office, s/p MAZE and left atrial appendage clipping, high risk of bleeding s/p CABG, and only a brief episode of atrial fibrillation during his NSTEMI which could be attributed to ischemia, likely risk for re-entry a.fib with subsequent clot formation is low. Will continue to monitor and re-evaluate at follow up appt. With Dr. Shlomo in 4 weeks.   -- S/p left atrial appendage ligation - see above  Non-ST elevation (NSTEMI) myocardial infarction  - Patient denies CP or similar symptoms following CABG - Reports strict adherence to medical therapies and is tolerating them well.   -- Essential hypertension  - BP in office 120/70 - Reports some increased dizziness on positional changes that's transient and resolves spontaneously. States he's been monitoring his BPs at home occasionally throughout the week and states they've been normal. Will continue to monitor at follow up visit   -- Hyperlipidemia  - Patient tolerating statins well - Cholesterol appears well controlled on current therapies, will continue at this time    Cardiac Rehabilitation Eligibility Assessment  The patient is ready to start cardiac rehabilitation from a cardiac standpoint.          Medication Adjustments/Labs and Tests Ordered: Current medicines are reviewed at length with the patient today.  Concerns regarding medicines are outlined above.  Orders Placed This Encounter  Procedures   CBC   Basic Metabolic Panel (BMET)   EKG 12-Lead   Meds ordered this encounter  Medications   clopidogrel  (PLAVIX ) 75 MG tablet    Sig: Take 1 tablet (75 mg total) by mouth daily.    Dispense:  90 tablet    Refill:  2    Patient Instructions  Medication Instructions:  Your physician  recommends that you continue on your current medications as directed. Please refer to the Current Medication list given to you today.  *If you need a refill on your cardiac medications before your next appointment, please call your pharmacy*  Lab Work: CBC, BMET-TODAY If you have labs (blood work) drawn today and your tests are completely normal, you will receive your results only by: MyChart Message (if you have MyChart) OR A paper copy in the mail If you have any lab test that is abnormal or we need to change your treatment, we will call you to review the results.  Follow-Up: At [facility], you and your health needs are our priority.  As part of our continuing mission to provide you with exceptional heart care, our providers are all part of one team.  This team includes your primary Cardiologist (physician) and Advanced Practice Providers or APPs (Physician Assistants and Nurse Practitioners) who all work together to provide you with the care you need, when you need it.  Your next appointment:   Keep follow up appointments as scheduled       Signed, Jowel Waltner E Charnell Peplinski, NP  02/03/2024 5:07 PM    [facility]

## 2024-02-03 ENCOUNTER — Encounter: Payer: Self-pay | Admitting: Physician Assistant

## 2024-02-03 ENCOUNTER — Ambulatory Visit: Attending: Physician Assistant | Admitting: Emergency Medicine

## 2024-02-03 VITALS — BP 120/70 | HR 77 | Ht 66.0 in | Wt 210.0 lb

## 2024-02-03 DIAGNOSIS — Z951 Presence of aortocoronary bypass graft: Secondary | ICD-10-CM | POA: Diagnosis not present

## 2024-02-03 DIAGNOSIS — I214 Non-ST elevation (NSTEMI) myocardial infarction: Secondary | ICD-10-CM | POA: Insufficient documentation

## 2024-02-03 DIAGNOSIS — E785 Hyperlipidemia, unspecified: Secondary | ICD-10-CM | POA: Insufficient documentation

## 2024-02-03 DIAGNOSIS — Z8679 Personal history of other diseases of the circulatory system: Secondary | ICD-10-CM | POA: Diagnosis not present

## 2024-02-03 DIAGNOSIS — Z9889 Other specified postprocedural states: Secondary | ICD-10-CM | POA: Insufficient documentation

## 2024-02-03 DIAGNOSIS — I10 Essential (primary) hypertension: Secondary | ICD-10-CM | POA: Insufficient documentation

## 2024-02-03 MED ORDER — CLOPIDOGREL BISULFATE 75 MG PO TABS
75.0000 mg | ORAL_TABLET | Freq: Every day | ORAL | 2 refills | Status: AC
Start: 2024-02-03 — End: ?

## 2024-02-03 NOTE — Patient Instructions (Signed)
 Medication Instructions:  Your physician recommends that you continue on your current medications as directed. Please refer to the Current Medication list given to you today.  *If you need a refill on your cardiac medications before your next appointment, please call your pharmacy*  Lab Work: CBC, BMET-TODAY If you have labs (blood work) drawn today and your tests are completely normal, you will receive your results only by: MyChart Message (if you have MyChart) OR A paper copy in the mail If you have any lab test that is abnormal or we need to change your treatment, we will call you to review the results.  Follow-Up: At [facility], you and your health needs are our priority.  As part of our continuing mission to provide you with exceptional heart care, our providers are all part of one team.  This team includes your primary Cardiologist (physician) and Advanced Practice Providers or APPs (Physician Assistants and Nurse Practitioners) who all work together to provide you with the care you need, when you need it.  Your next appointment:   Keep follow up appointments as scheduled

## 2024-02-04 ENCOUNTER — Other Ambulatory Visit: Payer: Self-pay

## 2024-02-04 ENCOUNTER — Ambulatory Visit: Payer: Self-pay | Admitting: Emergency Medicine

## 2024-02-04 LAB — BASIC METABOLIC PANEL WITH GFR
BUN/Creatinine Ratio: 12 (ref 10–24)
BUN: 9 mg/dL (ref 8–27)
CO2: 20 mmol/L (ref 20–29)
Calcium: 9.6 mg/dL (ref 8.6–10.2)
Chloride: 97 mmol/L (ref 96–106)
Creatinine, Ser: 0.78 mg/dL (ref 0.76–1.27)
Glucose: 126 mg/dL — ABNORMAL HIGH (ref 70–99)
Potassium: 4.6 mmol/L (ref 3.5–5.2)
Sodium: 135 mmol/L (ref 134–144)
eGFR: 93 mL/min/1.73 (ref >59)

## 2024-02-04 LAB — CBC
Hematocrit: 32.1 % — ABNORMAL LOW (ref 37.5–51.0)
Hemoglobin: 10.5 g/dL — ABNORMAL LOW (ref 13.0–17.7)
MCH: 36.1 pg — ABNORMAL HIGH (ref 26.6–33.0)
MCHC: 32.7 g/dL (ref 31.5–35.7)
MCV: 110 fL — ABNORMAL HIGH (ref 79–97)
Platelets: 280 x10E3/uL (ref 150–450)
RBC: 2.91 x10E6/uL — ABNORMAL LOW (ref 4.14–5.80)
RDW: 11.9 % (ref 11.6–15.4)
WBC: 5.1 x10E3/uL (ref 3.4–10.8)

## 2024-02-05 ENCOUNTER — Other Ambulatory Visit: Payer: Self-pay

## 2024-02-05 MED ORDER — METOPROLOL TARTRATE 25 MG PO TABS: 25.0000 mg | ORAL_TABLET | Freq: Two times a day (BID) | ORAL | 3 refills | Status: AC

## 2024-02-05 MED ORDER — LOSARTAN POTASSIUM 25 MG PO TABS: 25.0000 mg | ORAL_TABLET | Freq: Every day | ORAL | 3 refills | Status: DC

## 2024-02-05 MED ORDER — METOPROLOL TARTRATE 25 MG PO TABS: 25.0000 mg | ORAL_TABLET | Freq: Two times a day (BID) | ORAL | 3 refills | Status: DC

## 2024-02-05 NOTE — Telephone Encounter (Signed)
 RX sent in

## 2024-02-05 NOTE — Addendum Note (Signed)
 Addended by: BLUFORD, Lillian Tigges L on: 02/05/2024 01:00 PM   Modules accepted: Orders

## 2024-02-06 DIAGNOSIS — G4733 Obstructive sleep apnea (adult) (pediatric): Secondary | ICD-10-CM | POA: Diagnosis not present

## 2024-02-12 ENCOUNTER — Other Ambulatory Visit: Payer: Self-pay | Admitting: Thoracic Surgery (Cardiothoracic Vascular Surgery)

## 2024-02-12 DIAGNOSIS — Z951 Presence of aortocoronary bypass graft: Secondary | ICD-10-CM

## 2024-02-14 ENCOUNTER — Encounter: Attending: Cardiology | Admitting: *Deleted

## 2024-02-14 DIAGNOSIS — Z951 Presence of aortocoronary bypass graft: Secondary | ICD-10-CM | POA: Insufficient documentation

## 2024-02-14 DIAGNOSIS — Z48812 Encounter for surgical aftercare following surgery on the circulatory system: Secondary | ICD-10-CM | POA: Insufficient documentation

## 2024-02-14 DIAGNOSIS — M1712 Unilateral primary osteoarthritis, left knee: Secondary | ICD-10-CM | POA: Insufficient documentation

## 2024-02-14 DIAGNOSIS — I214 Non-ST elevation (NSTEMI) myocardial infarction: Secondary | ICD-10-CM | POA: Insufficient documentation

## 2024-02-14 NOTE — Progress Notes (Signed)
 Initial phone call completed. Diagnosis can be found in [facility] 7/11. EP Orientation scheduled for Wednesday 8/27 at 9:30.

## 2024-02-17 NOTE — Progress Notes (Unsigned)
 [address] [place] [address]             [phone]       HPI: [name] is a 78 year old man with medical history of hypertension, atrial fibrillation, NSTEMI, CAD, OSA, GERD, type 2 diabetes and osteoarthritis who returns for routine postoperative follow-up having undergone Coronary artery bypass grafting x 3, Left internal mammary artery to LAD, Left radial artery to OM, Saphenous vein graft to posterior descending, Pulmonary vein isolation using Encompass radiofrequency ablation device and Left atrial appendage clipping using 40 mm Medtronic Penditure clip on 01/06/2024 with Dr. Kerrin.  The patient's early postoperative recovery while in the hospital was notable for being started on Norvasc  5 mg for radial artery harvest. He had expected acute blood loss anemia due to surgery and was monitored clinically.  He was started on iron supplement and did not need a transfusion. He was routinely diuresed for surgical volume overload. He was started on aspirin  and Plavix  for NSTEMI.  He was also started on a beta-blocker, statin, and ARB at discharge.  He was discharged in stable condition on 01/10/2024.   Since hospital discharge the patient reports he has been doing well.  He has been walking every morning around 1.7 miles.  He has not been taking anything for pain.  He has been driving and mowing his lawn.  His incisions have been healing well.  He denies chest pain, shortness of breath and lower leg swelling.    Current Outpatient Medications  Medication Sig Dispense Refill   ALPRAZolam  (XANAX ) 0.5 MG tablet Take 0.25 mg by mouth at bedtime as needed for sleep.     amLODipine  (NORVASC ) 5 MG tablet TAKE 1 TABLET EVERY DAY 90 tablet 2   aspirin  81 MG EC tablet Take 81 mg by mouth daily.     atorvastatin  (LIPITOR ) 80 MG tablet TAKE 1 TABLET EVERY DAY 90 tablet 2   Cholecalciferol  250 MCG (10000 UT) TABS Take 1,000 Units by mouth daily.      clopidogrel  (PLAVIX ) 75 MG tablet Take 1 tablet (75 mg total) by mouth daily. 90 tablet 2   cyanocobalamin  1000 MCG tablet Take 1,000 mcg by mouth daily.     Fe Fum-Vit C-Vit B12-FA (TRIGELS-F FORTE) CAPS capsule Take 1 capsule by mouth daily after breakfast. 30 capsule 2   fluticasone  (FLONASE ) 50 MCG/ACT nasal spray Place 2 sprays into both nostrils daily.      losartan  (COZAAR ) 25 MG tablet Take 1 tablet (25 mg total) by mouth daily. 90 tablet 3   melatonin 3 MG TABS tablet Take 10 mg by mouth at bedtime.     metFORMIN  (GLUCOPHAGE ) 500 MG tablet Take 500 mg by mouth 2 (two) times daily.     metoprolol  tartrate (LOPRESSOR ) 25 MG tablet Take 1 tablet (25 mg total) by mouth 2 (two) times daily. 180 tablet 3   pantoprazole  (PROTONIX ) 40 MG tablet Take 40 mg by mouth 2 (two) times daily before a meal.     No current facility-administered medications for this visit.   Vitals:   02/18/24 1606  BP: (!) 155/82  Pulse: 90  Resp: 18  SpO2: 95%   Review of Systems  Constitutional:  Positive for malaise/fatigue.  Respiratory: Negative.  Negative for cough, shortness of breath and wheezing.   Cardiovascular:  Negative for chest pain and leg swelling.     Physical Exam Constitutional:  Appearance: Normal appearance.  HENT:     Head: Normocephalic and atraumatic.  Cardiovascular:     Rate and Rhythm: Normal rate and regular rhythm.     Heart sounds: Normal heart sounds, S1 normal and S2 normal.  Pulmonary:     Effort: Pulmonary effort is normal.     Breath sounds: Normal breath sounds.  Skin:    General: Skin is warm and dry.      Neurological:     General: No focal deficit present.     Mental Status: He is alert and oriented to person, place, and time.      Diagnostic Tests:  CLINICAL DATA:  CABG   EXAM: CHEST - 2 VIEW   COMPARISON:  Chest radiograph January 08, 2024   FINDINGS: Small left-sided pleural effusion, slightly increased to prior. Cardiomediastinal silhouette  is normal in size. Status post CABG and atrial appendage clip. No new consolidation or pulmonary nodule. No effusion on the right. No acute osseous abnormality.   IMPRESSION: No new consolidation.   Slightly increased left-sided small pleural effusion.     Electronically Signed   By: Megan  Zare M.D.   On: 02/18/2024 17:11   Plan: S/P CABG x 3  We reviewed today's chest x ray.  He is cleared to participate in cardiac rehab and has orientation tomorrow.  He is a full 6 weeks from surgery today and so sternal precautions can be lifted.  He should still be cautious and slowly increase his lifting and activity as tolerated.    He has been seen by cardiology.  He is to continue amlodipine , aspirin , atorvastatin , plavix , losartan  and metoprolol .  He is to continue to follow up with cardiology as scheduled   Follow up with [facility] as needed  Manuelita CHRISTELLA Rough, PA-C [facility] [phone]

## 2024-02-18 ENCOUNTER — Ambulatory Visit: Payer: Self-pay

## 2024-02-18 ENCOUNTER — Ambulatory Visit (HOSPITAL_COMMUNITY)
Admission: RE | Admit: 2024-02-18 | Discharge: 2024-02-18 | Disposition: A | Discharge status: Home / Self Care | Source: Ambulatory Visit | Attending: Cardiovascular Disease | Admitting: Cardiovascular Disease

## 2024-02-18 ENCOUNTER — Ambulatory Visit: Admitting: Thoracic Surgery (Cardiothoracic Vascular Surgery)

## 2024-02-18 VITALS — BP 155/82 | HR 90 | Resp 18 | Ht 66.0 in | Wt 213.0 lb

## 2024-02-18 DIAGNOSIS — Z951 Presence of aortocoronary bypass graft: Secondary | ICD-10-CM | POA: Insufficient documentation

## 2024-02-18 DIAGNOSIS — J90 Pleural effusion, not elsewhere classified: Secondary | ICD-10-CM | POA: Insufficient documentation

## 2024-02-18 NOTE — Patient Instructions (Signed)
-  Continue follow up with cardiology as scheduled -Follow up with [facility] as needed

## 2024-02-19 ENCOUNTER — Encounter

## 2024-02-19 VITALS — Ht 66.5 in | Wt 210.2 lb

## 2024-02-19 DIAGNOSIS — Z48812 Encounter for surgical aftercare following surgery on the circulatory system: Secondary | ICD-10-CM | POA: Diagnosis not present

## 2024-02-19 DIAGNOSIS — Z951 Presence of aortocoronary bypass graft: Secondary | ICD-10-CM

## 2024-02-19 DIAGNOSIS — I214 Non-ST elevation (NSTEMI) myocardial infarction: Secondary | ICD-10-CM | POA: Diagnosis not present

## 2024-02-19 NOTE — Progress Notes (Signed)
 Cardiac Individual Treatment Plan  Patient Details  Name: Daniel Holden MRN: [id number] Date of Birth: 25-Nov-1945 Referring Provider:   Flowsheet Row Cardiac Rehab from 02/19/2024 in [facility] Cardiac and Pulmonary Rehab  Referring Provider Dr. Wilbert Bihari, MD    Initial Encounter Date:  Flowsheet Row Cardiac Rehab from 02/19/2024 in [facility] Cardiac and Pulmonary Rehab  Date 02/19/24    Visit Diagnosis: S/P CABG x 3  Patient's Home Medications on Admission:  Current Outpatient Medications:    ALPRAZolam  (XANAX ) 0.5 MG tablet, Take 0.25 mg by mouth at bedtime as needed for sleep., Disp: , Rfl:    amLODipine  (NORVASC ) 5 MG tablet, TAKE 1 TABLET EVERY DAY, Disp: 90 tablet, Rfl: 2   aspirin  81 MG EC tablet, Take 81 mg by mouth daily., Disp: , Rfl:    atorvastatin  (LIPITOR ) 80 MG tablet, TAKE 1 TABLET EVERY DAY, Disp: 90 tablet, Rfl: 2   Cholecalciferol  250 MCG (10000 UT) TABS, Take 1,000 Units by mouth daily., Disp: , Rfl:    clopidogrel  (PLAVIX ) 75 MG tablet, Take 1 tablet (75 mg total) by mouth daily., Disp: 90 tablet, Rfl: 2   cyanocobalamin  1000 MCG tablet, Take 1,000 mcg by mouth daily., Disp: , Rfl:    Fe Fum-Vit C-Vit B12-FA (TRIGELS-F FORTE) CAPS capsule, Take 1 capsule by mouth daily after breakfast., Disp: 30 capsule, Rfl: 2   fluticasone  (FLONASE ) 50 MCG/ACT nasal spray, Place 2 sprays into both nostrils daily. , Disp: , Rfl:    losartan  (COZAAR ) 25 MG tablet, Take 1 tablet (25 mg total) by mouth daily., Disp: 90 tablet, Rfl: 3   melatonin 3 MG TABS tablet, Take 10 mg by mouth at bedtime., Disp: , Rfl:    metFORMIN  (GLUCOPHAGE ) 500 MG tablet, Take 500 mg by mouth 2 (two) times daily., Disp: , Rfl:    metoprolol  tartrate (LOPRESSOR ) 25 MG tablet, Take 1 tablet (25 mg total) by mouth 2 (two) times daily. (Patient taking differently: Take 25 mg by mouth daily.), Disp: 180 tablet, Rfl: 3   pantoprazole  (PROTONIX ) 40 MG tablet, Take 40 mg by mouth 2 (two) times daily before a meal., Disp:  , Rfl:   Past Medical History: Past Medical History:  Diagnosis Date   Allergy    Angina of effort (HCC)    Anxiety    CAD (coronary artery disease), native coronary artery    coronary CTA showing aortic atherosclerosis with very high coronary Ca score at 4630 with severely calcified RCA and LAD > 70% and 50-69% prox to mid LCx.     Cancer (HCC)    Carpal tunnel syndrome    GERD (gastroesophageal reflux disease)    HLD (hyperlipidemia)    Hypertension    Lung nodule    Obesity    Pancreatitis 2015   Sleep apnea     Tobacco Use: Social History   Tobacco Use  Smoking Status Never  Smokeless Tobacco Never    Labs: Review Flowsheet  More data exists      Latest Ref Rng & Units 11/07/2020 03/19/2021 01/02/2024 01/05/2024 01/06/2024  Labs for ITP Cardiac and Pulmonary Rehab  Cholestrol 0 - 200 mg/dL 895  - 880  - -  LDL (calc) 0 - 99 mg/dL 26  - 33  - -  HDL-C >59 mg/dL 58  - 60  - -  Trlycerides <150 mg/dL 888  - 867  - -  Hemoglobin A1c 4.8 - 5.6 % - 6.6  6.1  - -  PH, Arterial  7.35 - 7.45 - - - 7.44  7.410  7.362  7.347  7.372  7.425  7.347  7.319   PCO2 arterial 32 - 48 mmHg - - - 31  32.1  33.2  38.9  37.6  35.7  34.9  41.1   Bicarbonate 20.0 - 28.0 mmol/L - - - 21.1  20.3  18.9  21.7  21.8  23.4  19.1  21.1   TCO2 22 - 32 mmol/L - - - - 21  20  23  22  23  25  24  24  20  22  21  22    Acid-base deficit 0.0 - 2.0 mmol/L - - - 2.1  4.0  6.0  4.0  3.0  1.0  6.0  5.0   O2 Saturation % - - - 99.4  98  99  97  99  100  100  100     Details       Multiple values from one day are sorted in reverse-chronological order          Exercise Target Goals: Exercise Program Goal: Individual exercise prescription set using results from initial 6 min walk test and THRR while considering  patient's activity barriers and safety.   Exercise Prescription Goal: Initial exercise prescription builds to 30-45 minutes a day of aerobic activity, 2-3 days per week.  Home exercise  guidelines will be given to patient during program as part of exercise prescription that the participant will acknowledge.   Education: Aerobic Exercise: - Group verbal and visual presentation on the components of exercise prescription. Introduces F.I.T.T principle from ACSM for exercise prescriptions.  Reviews F.I.T.T. principles of aerobic exercise including progression. Written material provided at class time. Flowsheet Row Cardiac Rehab from 05/24/2021 in [facility] Cardiac and Pulmonary Rehab  Education need identified 03/09/21    Education: Resistance Exercise: - Group verbal and visual presentation on the components of exercise prescription. Introduces F.I.T.T principle from ACSM for exercise prescriptions  Reviews F.I.T.T. principles of resistance exercise including progression. Written material provided at class time. Flowsheet Row Cardiac Rehab from 05/24/2021 in [facility] Cardiac and Pulmonary Rehab  Date 04/26/21  Educator [facility]  Instruction Review Code 1- Verbalizes Understanding     Education: Exercise & Equipment Safety: - Individual verbal instruction and demonstration of equipment use and safety with use of the equipment. Flowsheet Row Cardiac Rehab from 02/19/2024 in [facility] Cardiac and Pulmonary Rehab  Date 02/19/24  Educator NT  Instruction Review Code 1- Verbalizes Understanding    Education: Exercise Physiology & General Exercise Guidelines: - Group verbal and written instruction with models to review the exercise physiology of the cardiovascular system and associated critical values. Provides general exercise guidelines with specific guidelines to those with heart or lung disease. Written material provided at class time. Flowsheet Row Cardiac Rehab from 05/24/2021 in [facility] Cardiac and Pulmonary Rehab  Date 04/12/21  Educator [facility]  Instruction Review Code 1- Verbalizes Understanding    Education: Flexibility, Balance, Mind/Body Relaxation: - Group verbal and visual presentation  with interactive activity on the components of exercise prescription. Introduces F.I.T.T principle from ACSM for exercise prescriptions. Reviews F.I.T.T. principles of flexibility and balance exercise training including progression. Also discusses the mind body connection.  Reviews various relaxation techniques to help reduce and manage stress (i.e. Deep breathing, progressive muscle relaxation, and visualization). Balance handout provided to take home. Written material provided at class time. Flowsheet Row Cardiac Rehab from 05/24/2021 in [facility] Cardiac and Pulmonary Rehab  Date 05/03/21  Educator AS  Instruction Review Code 1- Verbalizes Understanding    Activity Barriers & Risk Stratification:  Activity Barriers & Cardiac Risk Stratification - 02/14/24 1033       Activity Barriers & Cardiac Risk Stratification   Activity Barriers Left Knee Replacement;Joint Problems;Back Problems    Cardiac Risk Stratification High          6 Minute Walk:  6 Minute Walk     Row Name 02/19/24 1143         6 Minute Walk   Phase Initial     Distance 1195 feet     Walk Time 6 minutes     # of Rest Breaks 0     MPH 2.26     METS 2.26     RPE 10     Perceived Dyspnea  1     VO2 Peak 7.92     Symptoms No     Resting HR 76 bpm     Resting BP 142/68     Resting Oxygen Saturation  96 %     Exercise Oxygen Saturation  during 6 min walk 96 %     Max Ex. HR 96 bpm     Max Ex. BP 160/72     2 Minute Post BP 154/72        Oxygen Initial Assessment:   Oxygen Re-Evaluation:   Oxygen Discharge (Final Oxygen Re-Evaluation):   Initial Exercise Prescription:  Initial Exercise Prescription - 02/19/24 1100       Date of Initial Exercise RX and Referring Provider   Date 02/19/24    Referring Provider Dr. Wilbert Bihari, MD      Oxygen   Maintain Oxygen Saturation 88% or higher      Treadmill   MPH 2.2    Grade 0    Minutes 15    METs 2.68      REL-XR   Level 3    Speed 50    Minutes  15    METs 2.26      Biostep-RELP   Level 2    SPM 50    Minutes 15    METs 2.26      Track   Laps 26    Minutes 15    METs 2.41      Prescription Details   Frequency (times per week) 3    Duration Progress to 30 minutes of continuous aerobic without signs/symptoms of physical distress      Intensity   THRR 40-80% of Max Heartrate 103-131    Ratings of Perceived Exertion 11-13    Perceived Dyspnea 0-4      Progression   Progression Continue to progress workloads to maintain intensity without signs/symptoms of physical distress.      Resistance Training   Training Prescription Yes    Weight 5 lb    Reps 10-15          Perform Capillary Blood Glucose checks as needed.  Exercise Prescription Changes:   Exercise Prescription Changes     Row Name 02/19/24 1100             Response to Exercise   Blood Pressure (Admit) 142/68       Blood Pressure (Exercise) 160/72       Blood Pressure (Exit) 154/72       Heart Rate (Admit) 76 bpm       Heart Rate (Exercise) 96 bpm       Heart Rate (Exit) 89  bpm       Oxygen Saturation (Admit) 96 %       Oxygen Saturation (Exercise) 96 %       Rating of Perceived Exertion (Exercise) 10       Perceived Dyspnea (Exercise) 1       Symptoms none       Comments Results          Exercise Comments:   Exercise Goals and Review:   Exercise Goals     Row Name 02/19/24 1157             Exercise Goals   Increase Physical Activity Yes       Intervention Provide advice, education, support and counseling about physical activity/exercise needs.;Develop an individualized exercise prescription for aerobic and resistive training based on initial evaluation findings, risk stratification, comorbidities and participant's personal goals.       Expected Outcomes Short Term: Attend rehab on a regular basis to increase amount of physical activity.;Long Term: Add in home exercise to make exercise part of routine and to increase amount  of physical activity.;Long Term: Exercising regularly at least 3-5 days a week.       Increase Strength and Stamina Yes       Intervention Provide advice, education, support and counseling about physical activity/exercise needs.;Develop an individualized exercise prescription for aerobic and resistive training based on initial evaluation findings, risk stratification, comorbidities and participant's personal goals.       Expected Outcomes Short Term: Increase workloads from initial exercise prescription for resistance, speed, and METs.;Short Term: Perform resistance training exercises routinely during rehab and add in resistance training at home;Long Term: Improve cardiorespiratory fitness, muscular endurance and strength as measured by increased METs and functional capacity ( )       Able to understand and use rate of perceived exertion (RPE) scale Yes       Intervention Provide education and explanation on how to use RPE scale       Expected Outcomes Short Term: Able to use RPE daily in rehab to express subjective intensity level;Long Term:  Able to use RPE to guide intensity level when exercising independently       Able to understand and use Dyspnea scale Yes       Intervention Provide education and explanation on how to use Dyspnea scale       Expected Outcomes Short Term: Able to use Dyspnea scale daily in rehab to express subjective sense of shortness of breath during exertion;Long Term: Able to use Dyspnea scale to guide intensity level when exercising independently       Knowledge and understanding of Target Heart Rate Range (THRR) Yes       Intervention Provide education and explanation of THRR including how the numbers were predicted and where they are located for reference       Expected Outcomes Short Term: Able to state/look up THRR;Long Term: Able to use THRR to govern intensity when exercising independently;Short Term: Able to use daily as guideline for intensity in rehab       Able  to check pulse independently Yes       Intervention Provide education and demonstration on how to check pulse in carotid and radial arteries.;Review the importance of being able to check your own pulse for safety during independent exercise       Expected Outcomes Short Term: Able to explain why pulse checking is important during independent exercise;Long Term: Able to check pulse independently and accurately  Understanding of Exercise Prescription Yes       Intervention Provide education, explanation, and written materials on patient's individual exercise prescription       Expected Outcomes Short Term: Able to explain program exercise prescription;Long Term: Able to explain home exercise prescription to exercise independently          Exercise Goals Re-Evaluation :   Discharge Exercise Prescription (Final Exercise Prescription Changes):  Exercise Prescription Changes - 02/19/24 1100       Response to Exercise   Blood Pressure (Admit) 142/68    Blood Pressure (Exercise) 160/72    Blood Pressure (Exit) 154/72    Heart Rate (Admit) 76 bpm    Heart Rate (Exercise) 96 bpm    Heart Rate (Exit) 89 bpm    Oxygen Saturation (Admit) 96 %    Oxygen Saturation (Exercise) 96 %    Rating of Perceived Exertion (Exercise) 10    Perceived Dyspnea (Exercise) 1    Symptoms none    Comments Results          Nutrition:  Target Goals: Understanding of nutrition guidelines, daily intake of sodium 1500mg , cholesterol 200mg , calories 30% from fat and 7% or less from saturated fats, daily to have 5 or more servings of fruits and vegetables.  Education: Nutrition 1 -Group instruction provided by verbal, written material, interactive activities, discussions, models, and posters to present general guidelines for heart healthy nutrition including macronutrients, label reading, and promoting whole foods over processed counterparts. Education serves as Pensions consultant of discussion of heart healthy  eating for all. Written material provided at class time.    Education: Nutrition 2 -Group instruction provided by verbal, written material, interactive activities, discussions, models, and posters to present general guidelines for heart healthy nutrition including sodium, cholesterol, and saturated fat. Providing guidance of habit forming to improve blood pressure, cholesterol, and body weight. Written material provided at class time.     Biometrics:  Pre Biometrics - 02/19/24 1157       Pre Biometrics   Height 5' 6.5 (1.689 m)    Weight 210 lb 3.2 oz (95.3 kg)    Waist Circumference 42.5 inches    Hip Circumference 42 inches    Waist to Hip Ratio 1.01 %    BMI (Calculated) 33.42    Single Leg Stand 30 seconds           Nutrition Therapy Plan and Nutrition Goals:  Nutrition Therapy & Goals - 02/19/24 1141       Nutrition Therapy   RD appointment deferred Yes      Intervention Plan   Intervention Prescribe, educate and counsel regarding individualized specific dietary modifications aiming towards targeted core components such as weight, hypertension, lipid management, diabetes, heart failure and other comorbidities.    Expected Outcomes Short Term Goal: Understand basic principles of dietary content, such as calories, fat, sodium, cholesterol and nutrients.;Short Term Goal: A plan has been developed with personal nutrition goals set during dietitian appointment.;Long Term Goal: Adherence to prescribed nutrition plan.          Nutrition Assessments:  MEDIFICTS Score Key: >=70 Need to make dietary changes  40-70 Heart Healthy Diet <= 40 Therapeutic Level Cholesterol Diet  Flowsheet Row Cardiac Rehab from 02/19/2024 in [facility] Cardiac and Pulmonary Rehab  Picture Your Plate Total Score on Admission 66   Picture Your Plate Scores: <59 Unhealthy dietary pattern with much room for improvement. 41-50 Dietary pattern unlikely to meet recommendations for good health and room  for improvement. 51-60 More healthful dietary pattern, with some room for improvement.  >60 Healthy dietary pattern, although there may be some specific behaviors that could be improved.    Nutrition Goals Re-Evaluation:   Nutrition Goals Discharge (Final Nutrition Goals Re-Evaluation):   Psychosocial: Target Goals: Acknowledge presence or absence of significant depression and/or stress, maximize coping skills, provide positive support system. Participant is able to verbalize types and ability to use techniques and skills needed for reducing stress and depression.   Education: Stress, Anxiety, and Depression - Group verbal and visual presentation to define topics covered.  Reviews how body is impacted by stress, anxiety, and depression.  Also discusses healthy ways to reduce stress and to treat/manage anxiety and depression. Written material provided at class time. Flowsheet Row Cardiac Rehab from 02/19/2024 in [facility] Cardiac and Pulmonary Rehab  Education need identified 02/19/24    Education: Sleep Hygiene -Provides group verbal and written instruction about how sleep can affect your health.  Define sleep hygiene, discuss sleep cycles and impact of sleep habits. Review good sleep hygiene tips.   Initial Review & Psychosocial Screening:  Initial Psych Review & Screening - 02/14/24 1042       Initial Review   Current issues with Current Stress Concerns      Family Dynamics   Good Support System? Yes      Barriers   Psychosocial barriers to participate in program There are no identifiable barriers or psychosocial needs.      Screening Interventions   Interventions Encouraged to exercise;Provide feedback about the scores to participant;To provide support and resources with identified psychosocial needs    Expected Outcomes Short Term goal: Utilizing psychosocial counselor, staff and physician to assist with identification of specific Stressors or current issues interfering with  healing process. Setting desired goal for each stressor or current issue identified.;Long Term Goal: Stressors or current issues are controlled or eliminated.;Short Term goal: Identification and review with participant of any Quality of Life or Depression concerns found by scoring the questionnaire.;Long Term goal: The participant improves quality of Life and PHQ9 Scores as seen by post scores and/or verbalization of changes          Quality of Life Scores:   Quality of Life - 02/19/24 1142       Quality of Life   Select Quality of Life      Quality of Life Scores   Health/Function Pre 23.8 %    Socioeconomic Pre 25.63 %    Psych/Spiritual Pre 26.64 %    Family Pre 30 %    GLOBAL Pre 25.67 %         Scores of 19 and below usually indicate a poorer quality of life in these areas.  A difference of  2-3 points is a clinically meaningful difference.  A difference of 2-3 points in the total score of the Quality of Life Index has been associated with significant improvement in overall quality of life, self-image, physical symptoms, and general health in studies assessing change in quality of life.  PHQ-9: Review Flowsheet  More data may exist      02/19/2024 05/26/2021 03/09/2021 01/23/2021 07/14/2020  Depression screen PHQ 2/9  Decreased Interest 0 0 0 0 0  Down, Depressed, Hopeless 0 0 0 0 0  PHQ - 2 Score 0 0 0 0 0  Altered sleeping 0 0 0 - -  Tired, decreased energy 2 1 1  - -  Change in appetite 0 1 1 - -  Feeling bad or failure about yourself  0 0 0 - -  Trouble concentrating 0 0 0 - -  Moving slowly or fidgety/restless 1 0 0 - -  Suicidal thoughts 0 0 0 - -  PHQ-9 Score 3 2 2  - -  Difficult doing work/chores Not difficult at all Not difficult at all Not difficult at all - -   Interpretation of Total Score  Total Score Depression Severity:  1-4 = Minimal depression, 5-9 = Mild depression, 10-14 = Moderate depression, 15-19 = Moderately severe depression, 20-27 = Severe  depression   Psychosocial Evaluation and Intervention:  Psychosocial Evaluation - 02/14/24 1047       Psychosocial Evaluation & Interventions   Interventions Encouraged to exercise with the program and follow exercise prescription;Relaxation education    Comments 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 He states he has been walking consistently, but mentions some stress because he feels like he should be further along in his progress. He stays active and is ready to get involved in the program to increase his stamina. He started sleeping in his bed this past week and has been getting a consistent 8 hours which he is pleased with.    Expected Outcomes Short: attend cardiac rehab for education and exercise Long: develop and maintain positive self care habits    Continue Psychosocial Services  Follow up required by staff          Psychosocial Re-Evaluation:   Psychosocial Discharge (Final Psychosocial Re-Evaluation):   Vocational Rehabilitation: Provide vocational rehab assistance to qualifying candidates.   Vocational Rehab Evaluation & Intervention:  Vocational Rehab - 02/14/24 1042       Initial Vocational Rehab Evaluation & Intervention   Assessment shows need for Vocational Rehabilitation No          Education: Education Goals: Education classes will be provided on a variety of topics geared toward better understanding of heart health and risk factor modification. Participant will state understanding/return demonstration of topics presented as noted by education test scores.  Learning Barriers/Preferences:  Learning Barriers/Preferences - 02/14/24 1040       Learning Barriers/Preferences   Learning Barriers None    Learning Preferences Individual Instruction          General Cardiac Education Topics:  AED/CPR: - Group verbal and written instruction with the use of models to demonstrate the basic use of the AED with the basic ABC's of  resuscitation.   Test and Procedures: - Group verbal and visual presentation and models provide information about basic cardiac anatomy and function. Reviews the testing methods done to diagnose heart disease and the outcomes of the test results. Describes the treatment choices: Medical Management, Angioplasty, or Coronary Bypass Surgery for treating various heart conditions including Myocardial Infarction, Angina, Valve Disease, and Cardiac Arrhythmias. Written material provided at class time. Flowsheet Row Cardiac Rehab from 02/19/2024 in [facility] Cardiac and Pulmonary Rehab  Education need identified 02/19/24    Medication Safety: - Group verbal and visual instruction to review commonly prescribed medications for heart and lung disease. Reviews the medication, class of the drug, and side effects. Includes the steps to properly store meds and maintain the prescription regimen. Written material provided at class time. Flowsheet Row Cardiac Rehab from 05/24/2021 in [facility] Cardiac and Pulmonary Rehab  Date 05/17/21  Educator SB  Instruction Review Code 1- Verbalizes Understanding    Intimacy: - Group verbal instruction through game format to discuss how heart and lung disease  can affect sexual intimacy. Written material provided at class time.   Know Your Numbers and Heart Failure: - Group verbal and visual instruction to discuss disease risk factors for cardiac and pulmonary disease and treatment options.  Reviews associated critical values for Overweight/Obesity, Hypertension, Cholesterol, and Diabetes.  Discusses basics of heart failure: signs/symptoms and treatments.  Introduces Heart Failure Zone chart for action plan for heart failure. Written material provided at class time. Flowsheet Row Cardiac Rehab from 05/24/2021 in [facility] Cardiac and Pulmonary Rehab  Date 05/24/21  Educator SB  Instruction Review Code 1- Verbalizes Understanding    Infection Prevention: - Provides verbal and written  material to individual with discussion of infection control including proper hand washing and proper equipment cleaning during exercise session. Flowsheet Row Cardiac Rehab from 02/19/2024 in [facility] Cardiac and Pulmonary Rehab  Date 02/19/24  Educator NT  Instruction Review Code 1- Verbalizes Understanding    Falls Prevention: - Provides verbal and written material to individual with discussion of falls prevention and safety. Flowsheet Row Cardiac Rehab from 02/19/2024 in [facility] Cardiac and Pulmonary Rehab  Date 02/19/24  Educator NT  Instruction Review Code 1- Verbalizes Understanding    Other: -Provides group and verbal instruction on various topics (see comments)   Knowledge Questionnaire Score:  Knowledge Questionnaire Score - 02/19/24 1141       Knowledge Questionnaire Score   Pre Score 24/26          Core Components/Risk Factors/Patient Goals at Admission:  Personal Goals and Risk Factors at Admission - 02/14/24 1035       Core Components/Risk Factors/Patient Goals on Admission    Weight Management Yes    Intervention Weight Management: Develop a combined nutrition and exercise program designed to reach desired caloric intake, while maintaining appropriate intake of nutrient and fiber, sodium and fats, and appropriate energy expenditure required for the weight goal.;Weight Management: Provide education and appropriate resources to help participant work on and attain dietary goals.;Weight Management/Obesity: Establish reasonable short term and long term weight goals.    Goal Weight: Long Term 185 lb (83.9 kg)    Expected Outcomes Short Term: Continue to assess and modify interventions until short term weight is achieved;Long Term: Adherence to nutrition and physical activity/exercise program aimed toward attainment of established weight goal;Weight Loss: Understanding of general recommendations for a balanced deficit meal plan, which promotes 1-2 lb weight loss per week and  includes a negative energy balance of [phone] kcal/d;Understanding recommendations for meals to include 15-35% energy as protein, 25-35% energy from fat, 35-60% energy from carbohydrates, less than 200mg  of dietary cholesterol, 20-35 gm of total fiber daily;Understanding of distribution of calorie intake throughout the day with the consumption of 4-5 meals/snacks    Diabetes Yes    Intervention Provide education about signs/symptoms and action to take for hypo/hyperglycemia.;Provide education about proper nutrition, including hydration, and aerobic/resistive exercise prescription along with prescribed medications to achieve blood glucose in normal ranges: Fasting glucose 65-99 mg/dL    Expected Outcomes Short Term: Participant verbalizes understanding of the signs/symptoms and immediate care of hyper/hypoglycemia, proper foot care and importance of medication, aerobic/resistive exercise and nutrition plan for blood glucose control.;Long Term: Attainment of HbA1C < 7%.    Hypertension Yes    Intervention Provide education on lifestyle modifcations including regular physical activity/exercise, weight management, moderate sodium restriction and increased consumption of fresh fruit, vegetables, and low fat dairy, alcohol moderation, and smoking cessation.;Monitor prescription use compliance.    Expected Outcomes Short Term: Continued assessment  and intervention until BP is < 140/65mm HG in hypertensive participants. < 130/64mm HG in hypertensive participants with diabetes, heart failure or chronic kidney disease.;Long Term: Maintenance of blood pressure at goal levels.    Lipids Yes    Intervention Provide education and support for participant on nutrition & aerobic/resistive exercise along with prescribed medications to achieve LDL 70mg , HDL >40mg .    Expected Outcomes Short Term: Participant states understanding of desired cholesterol values and is compliant with medications prescribed. Participant is  following exercise prescription and nutrition guidelines.;Long Term: Cholesterol controlled with medications as prescribed, with individualized exercise RX and with personalized nutrition plan. Value goals: LDL < 70mg , HDL > 40 mg.          Education:Diabetes - Individual verbal and written instruction to review signs/symptoms of diabetes, desired ranges of glucose level fasting, after meals and with exercise. Acknowledge that pre and post exercise glucose checks will be done for 3 sessions at entry of program.   Core Components/Risk Factors/Patient Goals Review:    Core Components/Risk Factors/Patient Goals at Discharge (Final Review):    ITP Comments:  ITP Comments     Row Name 02/14/24 1051 02/19/24 1135         ITP Comments Initial phone call completed. Diagnosis can be found in [facility] 7/11. EP Orientation scheduled for Wednesday 8/27 at 9:30. Completed and gym orientation for cardiac rehab. Initial ITP created and sent for review to Dr. Oneil Pinal, Medical Director.         Comments: Initial ITP

## 2024-02-19 NOTE — Patient Instructions (Signed)
 Patient Instructions  Patient Details  Name: Daniel Holden MRN: [id number] Date of Birth: [birth year] Referring Provider:  Shlomo Wilbert SAUNDERS, MD  Below are your personal goals for exercise, nutrition, and risk factors. Our goal is to help you stay on track towards obtaining and maintaining these goals. We will be discussing your progress on these goals with you throughout the program.  Initial Exercise Prescription:  Initial Exercise Prescription - 02/19/24 1100       Date of Initial Exercise RX and Referring Provider   Date 02/19/24    Referring Provider Dr. Wilbert Shlomo, MD      Oxygen   Maintain Oxygen Saturation 88% or higher      Treadmill   MPH 2.2    Grade 0    Minutes 15    METs 2.68      REL-XR   Level 3    Speed 50    Minutes 15    METs 2.26      Biostep-RELP   Level 2    SPM 50    Minutes 15    METs 2.26      Track   Laps 26    Minutes 15    METs 2.41      Prescription Details   Frequency (times per week) 3    Duration Progress to 30 minutes of continuous aerobic without signs/symptoms of physical distress      Intensity   THRR 40-80% of Max Heartrate 103-131    Ratings of Perceived Exertion 11-13    Perceived Dyspnea 0-4      Progression   Progression Continue to progress workloads to maintain intensity without signs/symptoms of physical distress.      Resistance Training   Training Prescription Yes    Weight 5 lb    Reps 10-15          Exercise Goals: Frequency: Be able to perform aerobic exercise two to three times per week in program working toward 2-5 days per week of home exercise.  Intensity: Work with a perceived exertion of 11 (fairly light) - 15 (hard) while following your exercise prescription.  We will make changes to your prescription with you as you progress through the program.   Duration: Be able to do 30 to 45 minutes of continuous aerobic exercise in addition to a 5 minute warm-up and a 5 minute cool-down routine.    Nutrition Goals: Your personal nutrition goals will be established when you do your nutrition analysis with the dietician.  The following are general nutrition guidelines to follow: Cholesterol < 200mg /day Sodium < 1500mg /day Fiber: Men over 50 yrs - 30 grams per day  Personal Goals:  Personal Goals and Risk Factors at Admission - 02/14/24 1035       Core Components/Risk Factors/Patient Goals on Admission    Weight Management Yes    Intervention Weight Management: Develop a combined nutrition and exercise program designed to reach desired caloric intake, while maintaining appropriate intake of nutrient and fiber, sodium and fats, and appropriate energy expenditure required for the weight goal.;Weight Management: Provide education and appropriate resources to help participant work on and attain dietary goals.;Weight Management/Obesity: Establish reasonable short term and long term weight goals.    Goal Weight: Long Term 185 lb (83.9 kg)    Expected Outcomes Short Term: Continue to assess and modify interventions until short term weight is achieved;Long Term: Adherence to nutrition and physical activity/exercise program aimed toward attainment of established weight goal;Weight Loss: Understanding  of general recommendations for a balanced deficit meal plan, which promotes 1-2 lb weight loss per week and includes a negative energy balance of [phone] kcal/d;Understanding recommendations for meals to include 15-35% energy as protein, 25-35% energy from fat, 35-60% energy from carbohydrates, less than 200mg  of dietary cholesterol, 20-35 gm of total fiber daily;Understanding of distribution of calorie intake throughout the day with the consumption of 4-5 meals/snacks    Diabetes Yes    Intervention Provide education about signs/symptoms and action to take for hypo/hyperglycemia.;Provide education about proper nutrition, including hydration, and aerobic/resistive exercise prescription along with  prescribed medications to achieve blood glucose in normal ranges: Fasting glucose 65-99 mg/dL    Expected Outcomes Short Term: Participant verbalizes understanding of the signs/symptoms and immediate care of hyper/hypoglycemia, proper foot care and importance of medication, aerobic/resistive exercise and nutrition plan for blood glucose control.;Long Term: Attainment of HbA1C < 7%.    Hypertension Yes    Intervention Provide education on lifestyle modifcations including regular physical activity/exercise, weight management, moderate sodium restriction and increased consumption of fresh fruit, vegetables, and low fat dairy, alcohol moderation, and smoking cessation.;Monitor prescription use compliance.    Expected Outcomes Short Term: Continued assessment and intervention until BP is < 140/50mm HG in hypertensive participants. < 130/71mm HG in hypertensive participants with diabetes, heart failure or chronic kidney disease.;Long Term: Maintenance of blood pressure at goal levels.    Lipids Yes    Intervention Provide education and support for participant on nutrition & aerobic/resistive exercise along with prescribed medications to achieve LDL 70mg , HDL >40mg .    Expected Outcomes Short Term: Participant states understanding of desired cholesterol values and is compliant with medications prescribed. Participant is following exercise prescription and nutrition guidelines.;Long Term: Cholesterol controlled with medications as prescribed, with individualized exercise RX and with personalized nutrition plan. Value goals: LDL < 70mg , HDL > 40 mg.         Exercise Goals and Review:  Exercise Goals     Row Name 02/19/24 1157             Exercise Goals   Increase Physical Activity Yes       Intervention Provide advice, education, support and counseling about physical activity/exercise needs.;Develop an individualized exercise prescription for aerobic and resistive training based on initial evaluation  findings, risk stratification, comorbidities and participant's personal goals.       Expected Outcomes Short Term: Attend rehab on a regular basis to increase amount of physical activity.;Long Term: Add in home exercise to make exercise part of routine and to increase amount of physical activity.;Long Term: Exercising regularly at least 3-5 days a week.       Increase Strength and Stamina Yes       Intervention Provide advice, education, support and counseling about physical activity/exercise needs.;Develop an individualized exercise prescription for aerobic and resistive training based on initial evaluation findings, risk stratification, comorbidities and participant's personal goals.       Expected Outcomes Short Term: Increase workloads from initial exercise prescription for resistance, speed, and METs.;Short Term: Perform resistance training exercises routinely during rehab and add in resistance training at home;Long Term: Improve cardiorespiratory fitness, muscular endurance and strength as measured by increased METs and functional capacity ( )       Able to understand and use rate of perceived exertion (RPE) scale Yes       Intervention Provide education and explanation on how to use RPE scale       Expected Outcomes Short Term: Able  to use RPE daily in rehab to express subjective intensity level;Long Term:  Able to use RPE to guide intensity level when exercising independently       Able to understand and use Dyspnea scale Yes       Intervention Provide education and explanation on how to use Dyspnea scale       Expected Outcomes Short Term: Able to use Dyspnea scale daily in rehab to express subjective sense of shortness of breath during exertion;Long Term: Able to use Dyspnea scale to guide intensity level when exercising independently       Knowledge and understanding of Target Heart Rate Range (THRR) Yes       Intervention Provide education and explanation of THRR including how the numbers  were predicted and where they are located for reference       Expected Outcomes Short Term: Able to state/look up THRR;Long Term: Able to use THRR to govern intensity when exercising independently;Short Term: Able to use daily as guideline for intensity in rehab       Able to check pulse independently Yes       Intervention Provide education and demonstration on how to check pulse in carotid and radial arteries.;Review the importance of being able to check your own pulse for safety during independent exercise       Expected Outcomes Short Term: Able to explain why pulse checking is important during independent exercise;Long Term: Able to check pulse independently and accurately       Understanding of Exercise Prescription Yes       Intervention Provide education, explanation, and written materials on patient's individual exercise prescription       Expected Outcomes Short Term: Able to explain program exercise prescription;Long Term: Able to explain home exercise prescription to exercise independently

## 2024-02-21 ENCOUNTER — Encounter

## 2024-02-21 ENCOUNTER — Ambulatory Visit: Payer: Self-pay | Admitting: Cardiology

## 2024-02-21 DIAGNOSIS — Z951 Presence of aortocoronary bypass graft: Secondary | ICD-10-CM

## 2024-02-21 DIAGNOSIS — Z48812 Encounter for surgical aftercare following surgery on the circulatory system: Secondary | ICD-10-CM | POA: Diagnosis not present

## 2024-02-21 DIAGNOSIS — I214 Non-ST elevation (NSTEMI) myocardial infarction: Secondary | ICD-10-CM | POA: Diagnosis not present

## 2024-02-21 LAB — GLUCOSE, CAPILLARY
Glucose-Capillary: 158 mg/dL — ABNORMAL HIGH (ref 70–99)
Glucose-Capillary: 117 mg/dL — ABNORMAL HIGH (ref 70–99)

## 2024-02-21 NOTE — Progress Notes (Signed)
 Daily Session Note  Patient Details  Name: DYMOND SPREEN MRN: [id number] Date of Birth: [birth year] Referring Provider:   Flowsheet Row Cardiac Rehab from 02/19/2024 in [facility] Cardiac and Pulmonary Rehab  Referring Provider Dr. Wilbert Bihari, MD    Encounter Date: 02/21/2024  Check In:  Session Check In - 02/21/24 0927       Check-In   Supervising physician immediately available to respond to emergencies See telemetry face sheet for immediately available ER MD    Location [facility]-Cardiac & Pulmonary Rehab    Staff Present Burnard Davenport RN,BSN,MPA;Maxon Conetta BS, Exercise Physiologist;Joseph Hood RCP,RRT,BSRT;Noah Tickle, [place], Exercise Physiologist    Virtual Visit No    Medication changes reported     No    Fall or balance concerns reported    No    Tobacco Cessation No Change    Warm-up and Cool-down Performed on first and last piece of equipment    Resistance Training Performed Yes    VAD Patient? No    PAD/SET Patient? No      Pain Assessment   Currently in Pain? No/denies             Social History   Tobacco Use  Smoking Status Never  Smokeless Tobacco Never    Goals Met:  Independence with exercise equipment Exercise tolerated well No report of concerns or symptoms today Strength training completed today  Goals Unmet:  Not Applicable  Comments: First full day of exercise!  Patient was oriented to gym and equipment including functions, settings, policies, and procedures.  Patient's individual exercise prescription and treatment plan were reviewed.  All starting workloads were established based on the results of the 6 minute walk test done at initial orientation visit.  The plan for exercise progression was also introduced and progression will be customized based on patient's performance and goals.    Dr. Oneil Pinal is Medical Director for [facility] Cardiac Rehabilitation.  Dr. Fuad Aleskerov is Medical Director for [facility] Pulmonary Rehabilitation.

## 2024-02-25 ENCOUNTER — Encounter: Attending: Cardiology

## 2024-02-25 DIAGNOSIS — I252 Old myocardial infarction: Secondary | ICD-10-CM | POA: Insufficient documentation

## 2024-02-25 DIAGNOSIS — I213 ST elevation (STEMI) myocardial infarction of unspecified site: Secondary | ICD-10-CM

## 2024-02-25 DIAGNOSIS — Z951 Presence of aortocoronary bypass graft: Secondary | ICD-10-CM | POA: Insufficient documentation

## 2024-02-25 DIAGNOSIS — Z48812 Encounter for surgical aftercare following surgery on the circulatory system: Secondary | ICD-10-CM | POA: Diagnosis not present

## 2024-02-25 LAB — GLUCOSE, CAPILLARY
Glucose-Capillary: 142 mg/dL — ABNORMAL HIGH (ref 70–99)
Glucose-Capillary: 117 mg/dL — ABNORMAL HIGH (ref 70–99)

## 2024-02-25 NOTE — Progress Notes (Signed)
 Daily Session Note  Patient Details  Name: Daniel Holden MRN: [id number] Date of Birth: 1945/06/30 Referring Provider:   Flowsheet Row Cardiac Rehab from 02/19/2024 in [facility] Cardiac and Pulmonary Rehab  Referring Provider Dr. Wilbert Bihari, MD    Encounter Date: 02/25/2024  Check In:  Session Check In - 02/25/24 0934       Check-In   Supervising physician immediately available to respond to emergencies See telemetry face sheet for immediately available ER MD    Location [facility]-Cardiac & Pulmonary Rehab    Staff Present Burnard Davenport RN,BSN,MPA;Maxon Conetta BS, Exercise Physiologist;Laureen Delores, BS, RRT, CPFT;Jason Elnor RDN,LDN    Virtual Visit No    Medication changes reported     No    Fall or balance concerns reported    No    Tobacco Cessation No Change    Warm-up and Cool-down Performed on first and last piece of equipment    Resistance Training Performed Yes    VAD Patient? No    PAD/SET Patient? No      Pain Assessment   Currently in Pain? No/denies             Social History   Tobacco Use  Smoking Status Never  Smokeless Tobacco Never    Goals Met:  Independence with exercise equipment Exercise tolerated well No report of concerns or symptoms today Strength training completed today  Goals Unmet:  Not Applicable  Comments: Pt able to follow exercise prescription today without complaint.  Will continue to monitor for progression.    Dr. Oneil Pinal is Medical Director for [facility] Cardiac Rehabilitation.  Dr. Fuad Aleskerov is Medical Director for [facility] Pulmonary Rehabilitation.

## 2024-02-26 ENCOUNTER — Ambulatory Visit: Payer: Self-pay | Admitting: Cardiology

## 2024-02-26 ENCOUNTER — Encounter: Admitting: Emergency Medicine

## 2024-02-26 DIAGNOSIS — Z48812 Encounter for surgical aftercare following surgery on the circulatory system: Secondary | ICD-10-CM | POA: Diagnosis not present

## 2024-02-26 DIAGNOSIS — Z951 Presence of aortocoronary bypass graft: Secondary | ICD-10-CM | POA: Diagnosis not present

## 2024-02-26 DIAGNOSIS — I252 Old myocardial infarction: Secondary | ICD-10-CM | POA: Diagnosis not present

## 2024-02-26 DIAGNOSIS — I213 ST elevation (STEMI) myocardial infarction of unspecified site: Secondary | ICD-10-CM

## 2024-02-26 LAB — GLUCOSE, CAPILLARY
Glucose-Capillary: 160 mg/dL — ABNORMAL HIGH (ref 70–99)
Glucose-Capillary: 103 mg/dL — ABNORMAL HIGH (ref 70–99)

## 2024-02-26 NOTE — Progress Notes (Signed)
 Daily Session Note  Patient Details  Name: Daniel Holden MRN: [id number] Date of Birth: Dec 13, 1945 Referring Provider:   Flowsheet Row Cardiac Rehab from 02/19/2024 in [facility] Cardiac and Pulmonary Rehab  Referring Provider Dr. Wilbert Bihari, MD    Encounter Date: 02/26/2024  Check In:  Session Check In - 02/26/24 0939       Check-In   Supervising physician immediately available to respond to emergencies See telemetry face sheet for immediately available ER MD    Location [facility]-Cardiac & Pulmonary Rehab    Staff Present Maxon Burnell HECKLE, Exercise Physiologist;Joseph Rolinda RCP,RRT,BSRT;Viera Okonski RN,BSN;Kelly [place] BS, ACSM CEP, Exercise Physiologist    Virtual Visit No    Medication changes reported     No    Fall or balance concerns reported    No    Tobacco Cessation No Change    Warm-up and Cool-down Performed on first and last piece of equipment    Resistance Training Performed Yes    VAD Patient? No    PAD/SET Patient? No      Pain Assessment   Currently in Pain? No/denies             Social History   Tobacco Use  Smoking Status Never  Smokeless Tobacco Never    Goals Met:  Independence with exercise equipment Exercise tolerated well No report of concerns or symptoms today Strength training completed today  Goals Unmet:  Not Applicable  Comments: Pt able to follow exercise prescription today without complaint.  Will continue to monitor for progression.    Dr. Oneil Pinal is Medical Director for [facility] Cardiac Rehabilitation.  Dr. Fuad Aleskerov is Medical Director for [facility] Pulmonary Rehabilitation.

## 2024-02-27 ENCOUNTER — Encounter: Payer: Self-pay | Admitting: Cardiology

## 2024-02-27 ENCOUNTER — Encounter: Payer: Self-pay | Admitting: *Deleted

## 2024-02-27 ENCOUNTER — Ambulatory Visit: Attending: Cardiology | Admitting: Cardiology

## 2024-02-27 VITALS — BP 118/70 | HR 80 | Ht 66.0 in | Wt 209.0 lb

## 2024-02-27 DIAGNOSIS — I480 Paroxysmal atrial fibrillation: Secondary | ICD-10-CM | POA: Diagnosis not present

## 2024-02-27 DIAGNOSIS — I2510 Atherosclerotic heart disease of native coronary artery without angina pectoris: Secondary | ICD-10-CM | POA: Diagnosis not present

## 2024-02-27 DIAGNOSIS — I10 Essential (primary) hypertension: Secondary | ICD-10-CM | POA: Insufficient documentation

## 2024-02-27 DIAGNOSIS — Z79899 Other long term (current) drug therapy: Secondary | ICD-10-CM | POA: Diagnosis not present

## 2024-02-27 DIAGNOSIS — E7800 Pure hypercholesterolemia, unspecified: Secondary | ICD-10-CM | POA: Diagnosis not present

## 2024-02-27 DIAGNOSIS — R600 Localized edema: Secondary | ICD-10-CM | POA: Insufficient documentation

## 2024-02-27 NOTE — Progress Notes (Signed)
 Patient enrolled for [employer]/ [employer] to ship a 30 day cardiac event monitor to his address on file.

## 2024-02-27 NOTE — Progress Notes (Signed)
 Date:  02/27/2024   ID:  Daniel Holden, DOB 1946-02-01, MRN [id number] The patient was identified using 2 identifiers.  PCP:  Sherial Bail, MD   [facility] Providers Cardiologist:  Daniel Bihari, MD     Evaluation Performed:  Follow-Up Visit  Chief Complaint:  CAD, HTN, HLD  History of Present Illness:    Daniel Holden is a 78 y.o. male with a hx of anxiety, HTN , HLD and CAD with coronary CTA showing aortic atherosclerosis with very high coronary Ca score at 4630 with severely calcified RCA and LAD > 70% and 50-69% prox to mid LCx. Cardiac cath 10/04/2020 showed severe single vessel CAD with occluded RCA with extensive calcifications and multiple different lesions including 90% proxm and distal lesions, 50% oLCx and 50-60% dLCx and 40% pLAD with heavily calcified vessels with bridging collaterals from the septal perforator and diagonal to the PDA. Medical management was recommended. He was started on Ranexa  and Imdur . He has not been able to tolerate higher doses of Imdur  > 30mg  due to soft BP.    2D echo in July 2023 showed normal LV function with EF 55 to 60% and grade 1 diastolic dysfunction, mild MR, moderate left atrial enlargement, mild AI.   Since I saw him last he presented in 12/2023 with accelerating anginal and was found to be in afib with RVR and hsTrop elevated at 125.  Cath showed CTO of RCA, 95% LAD, 90% LCx and echo was normal.  He underwent CABG with LIMA-LAD, SVG-PL and a left radial-OM as well as MAZE and LA clipping.    He is here for follow-up today and is doing well.  He denies any anginal chest pain or pressure, SOB, DOE(except during extreme exertion), PND, orthopnea, lower extremity edema, dizziness, palpitations, syncope.  Past Medical History:  Diagnosis Date   Allergy    Angina of effort (HCC)    Anxiety    CAD (coronary artery disease), native coronary artery    s/p cath 12/2023 CTO of RCA, 95% LAD, 90% LCx and echo was normal.  He  underwent CABG with LIMA-LAD, SVG-PL and a left radial-OM .   Cancer (HCC)    Carpal tunnel syndrome    GERD (gastroesophageal reflux disease)    HLD (hyperlipidemia)    Hypertension    Lung nodule    Obesity    PAF (paroxysmal atrial fibrillation) (HCC) 12/2023   s/p MAZE and LA clippling   Pancreatitis 2015   Sleep apnea    Past Surgical History:  Procedure Laterality Date   CARPAL TUNNEL RELEASE     CLIPPING OF ATRIAL APPENDAGE N/A 01/06/2024   Procedure: CLIPPING, LEFT ATRIAL APPENDAGE;  Surgeon: Kerrin Elspeth BROCKS, MD;  Location: [facility] OR;  Service: Open Heart Surgery;  Laterality: N/A;   COLONOSCOPY WITH PROPOFOL  N/A 06/27/2021   Procedure: COLONOSCOPY WITH PROPOFOL ;  Surgeon: Maryruth Ole DASEN, MD;  Location: [facility] ENDOSCOPY;  Service: Endoscopy;  Laterality: N/A;   CORONARY ARTERY BYPASS GRAFT N/A 01/06/2024   Procedure: CORONARY ARTERY BYPASS GRAFTING X 3, USING LEFT INTERNAL MAMMARY ARTERY, HARVESTED LEFT RADIAL ARTERY AND ENDOSCOPIC HARVESTED RIGHT GREATER SAPHENOUS VEIN;  Surgeon: Kerrin Elspeth BROCKS, MD;  Location: [facility] OR;  Service: Open Heart Surgery;  Laterality: N/A;   ESOPHAGOGASTRODUODENOSCOPY (EGD) WITH PROPOFOL  N/A 06/27/2021   Procedure: ESOPHAGOGASTRODUODENOSCOPY (EGD) WITH PROPOFOL ;  Surgeon: Maryruth Ole DASEN, MD;  Location: [facility] ENDOSCOPY;  Service: Endoscopy;  Laterality: N/A;   ETHMOIDECTOMY Right 03/20/2021   Procedure: ANTERIOR  ETHMOIDECTOMY;  Surgeon: Blair Mt, MD;  Location: [facility] ORS;  Service: ENT;  Laterality: Right;   IMAGE GUIDED SINUS SURGERY N/A 03/20/2021   Procedure: IMAGE GUIDED SINUS SURGERY;  Surgeon: Blair Mt, MD;  Location: [facility] ORS;  Service: ENT;  Laterality: N/A;   INTRAOPERATIVE TRANSESOPHAGEAL ECHOCARDIOGRAM N/A 01/06/2024   Procedure: ECHOCARDIOGRAM, TRANSESOPHAGEAL, INTRAOPERATIVE;  Surgeon: Kerrin Elspeth BROCKS, MD;  Location: [facility] OR;  Service: Open Heart Surgery;  Laterality: N/A;   KNEE ARTHROSCOPY WITH MEDIAL MENISECTOMY Left  06/25/2019   Procedure: KNEE ARTHROSCOPY WITH MEDIAL MENISECTOMY;  Surgeon: Marchia Drivers, MD;  Location: [facility] ORS;  Service: Orthopedics;  Laterality: Left;   LEFT HEART CATH AND CORONARY ANGIOGRAPHY N/A 10/04/2020   Procedure: LEFT HEART CATH AND CORONARY ANGIOGRAPHY;  Surgeon: Anner Alm ORN, MD;  Location: [facility] INVASIVE CV LAB;  Service: Cardiovascular;  Laterality: N/A;   LEFT HEART CATH AND CORONARY ANGIOGRAPHY N/A 01/02/2024   Procedure: LEFT HEART CATH AND CORONARY ANGIOGRAPHY;  Surgeon: Anner Alm ORN, MD;  Location: [facility] INVASIVE CV LAB;  Service: Cardiovascular;  Laterality: N/A;   MAXILLARY ANTROSTOMY Right 03/20/2021   Procedure: MAXILLARY ANTROSTOMY WITH TISSUE REMOVAL;  Surgeon: Blair Mt, MD;  Location: [facility] ORS;  Service: ENT;  Laterality: Right;   MAZE N/A 01/06/2024   Procedure: COX MAZE PROCEDURE;  Surgeon: Kerrin Elspeth BROCKS, MD;  Location: [facility] OR;  Service: Open Heart Surgery;  Laterality: N/A;   RADIAL ARTERY HARVEST  01/06/2024   Procedure: SURGICAL PROCUREMENT, ARTERY, RADIAL;  Surgeon: Kerrin Elspeth BROCKS, MD;  Location: [facility] OR;  Service: Open Heart Surgery;;   SHOULDER SURGERY     TONSILLECTOMY       Current Meds  Medication Sig   ALPRAZolam  (XANAX ) 0.5 MG tablet Take 0.25 mg by mouth at bedtime as needed for sleep.   amLODipine  (NORVASC ) 5 MG tablet TAKE 1 TABLET EVERY DAY   aspirin  81 MG EC tablet Take 81 mg by mouth daily.   atorvastatin  (LIPITOR ) 80 MG tablet TAKE 1 TABLET EVERY DAY   Cholecalciferol  250 MCG (10000 UT) TABS Take 1,000 Units by mouth daily.   clopidogrel  (PLAVIX ) 75 MG tablet Take 1 tablet (75 mg total) by mouth daily.   cyanocobalamin  1000 MCG tablet Take 1,000 mcg by mouth daily.   Fe Fum-Vit C-Vit B12-FA (TRIGELS-F FORTE) CAPS capsule Take 1 capsule by mouth daily after breakfast.   fluticasone  (FLONASE ) 50 MCG/ACT nasal spray Place 2 sprays into both nostrils daily.    losartan  (COZAAR ) 25 MG tablet Take 1 tablet (25 mg total) by  mouth daily.   melatonin 3 MG TABS tablet Take 10 mg by mouth at bedtime.   metFORMIN  (GLUCOPHAGE ) 500 MG tablet Take 500 mg by mouth 2 (two) times daily.   metoprolol  tartrate (LOPRESSOR ) 25 MG tablet Take 1 tablet (25 mg total) by mouth 2 (two) times daily. (Patient taking differently: Take 25 mg by mouth daily.)   pantoprazole  (PROTONIX ) 40 MG tablet Take 40 mg by mouth 2 (two) times daily before a meal.     Allergies:   Metronidazole , Ace inhibitors, and Dilaudid  [hydromorphone  hcl]   Social History   Tobacco Use   Smoking status: Never   Smokeless tobacco: Never  Vaping Use   Vaping status: Never Used  Substance Use Topics   Alcohol use: Yes    Alcohol/week: 42.0 standard drinks of alcohol    Types: 42 Cans of beer per week    Comment: 6 per day   Drug use: No  Family Hx: The patient's family history includes CVA in his father.  ROS:   Please see the history of present illness.     All other systems reviewed and are negative.   Prior CV studies:   The following studies were reviewed today:  none  Labs/Other Tests and Data Reviewed:    EKG:  No ECG reviewed.  Recent Labs: 01/07/2024: Magnesium  2.1 02/03/2024: BUN 9; Creatinine, Ser 0.78; Hemoglobin 10.5; Platelets 280; Potassium 4.6; Sodium 135   Recent Lipid Panel Lab Results  Component Value Date/Time   CHOL 119 01/02/2024 04:26 AM   CHOL 104 11/07/2020 07:41 AM   CHOL 83 09/27/2013 03:19 AM   TRIG 132 01/02/2024 04:26 AM   TRIG 188 09/27/2013 03:19 AM   HDL 60 01/02/2024 04:26 AM   HDL 58 11/07/2020 07:41 AM   HDL 28 (L) 09/27/2013 03:19 AM   CHOLHDL 2.0 01/02/2024 04:26 AM   LDLCALC 33 01/02/2024 04:26 AM   LDLCALC 26 11/07/2020 07:41 AM   LDLCALC 17 09/27/2013 03:19 AM    Wt Readings from Last 3 Encounters:  02/27/24 209 lb (94.8 kg)  02/19/24 210 lb 3.2 oz (95.3 kg)  02/18/24 213 lb (96.6 kg)     Risk Assessment/Calculations:          Objective:    Vital Signs:  BP 118/70   Pulse  80   Ht 5' 6 (1.676 m)   Wt 209 lb (94.8 kg)   SpO2 99%   BMI 33.73 kg/m   GEN: Well nourished, well developed in no acute distress HEENT: Normal NECK: No JVD; No carotid bruits LYMPHATICS: No lymphadenopathy CARDIAC:RRR, no murmurs, rubs, gallops RESPIRATORY:  Clear to auscultation without rales, wheezing or rhonchi  ABDOMEN: Soft, non-tender, non-distended MUSCULOSKELETAL:  No edema; No deformity  SKIN: Warm and dry NEUROLOGIC:  Alert and oriented x 3 PSYCHIATRIC:  Normal affect  ASSESSMENT & PLAN:    ASCAD with chronic stable angina -coronary CTA showing aortic atherosclerosis with very high coronary Ca score at 4630 with severely calcified RCA and LAD > 70% and 50-69% prox to mid LCx.   -Cardiac cath 10/04/2020 showed severe single vessel CAD with occluded RCA with extensive calcifications and multiple different lesions including 90% prox and distal lesions, 50% oLCx and 50-60% dLCx and 40% pLAD with heavily calcified vessels with bridging collaterals from the septal perforator and diagonal to the PDA.  -Medical management was recommended  -presented in 12/2023 with accelerating anginal and was found to be in afib with RVR and hsTrop elevated at 125.  Cath showed CTO of RCA, 95% LAD, 90% LCx and echo was normal.  He underwent CABG with LIMA-LAD, SVG-PL and a left radial-OM  -He denies any further angina -Continue aspirin  81 mg daily, atorvastatin  80 mg daily, Plavix  75 mg daily, Lopressor  25 mg twice daily with as needed refills  2.  HTN -BP is controlled on exam today -Continue amlodipine  5 mg daily, losartan  25 mg daily, Lopressor  25 mg twice daily with as needed refills -I have personally reviewed and interpreted outside labs performed by patient's PCP which showed serum creatinine 0.78 and potassium 4.6 on 02/03/2024   3.  HLD -LDL goal < 70 -Continue atorvastatin  80 mg daily with as needed refills -I have personally reviewed and interpreted outside labs performed by  patient's PCP which showed LDL 33 and HDL 60 on 01/02/2024  - Check ALT today   4.  Chronic lower extremity edema - Remains well-controlled  - Has  not required diuretics recently  5.  Atrial fibrillation with RVR -presented with CP and afib with RVR -s/p MAZE procedure with LA clipping at time of CABG -no further palpitations -maintaining NSR On exam today -will get a 30 day event monitor to assess for any further afib>>fortunately he is very symptomatic when he was in afib   Medication Adjustments/Labs and Tests Ordered: Current medicines are reviewed at length with the patient today.  Concerns regarding medicines are outlined above.   Tests Ordered: No orders of the defined types were placed in this encounter.   Medication Changes: No orders of the defined types were placed in this encounter.   Follow Up:  In Person in 1 year(s)  Signed, Daniel Bihari, MD  02/27/2024 10:01 AM    [facility]

## 2024-02-27 NOTE — Patient Instructions (Signed)
 Medication Instructions:  Your physician recommends that you continue on your current medications as directed. Please refer to the Current Medication list given to you today.  *If you need a refill on your cardiac medications before your next appointment, please call your pharmacy*  Lab Work: Please complete a CBC and an ALT in our first floor lab before you leave today.  If you have labs (blood work) drawn today and your tests are completely normal, you will receive your results only by: MyChart Message (if you have MyChart) OR A paper copy in the mail If you have any lab test that is abnormal or we need to change your treatment, we will call you to review the results.  Testing/Procedures: [employer] CARDIAC EVENT MONITOR INSTRUCTIONS Your physician has requested you wear a cardiac event monitor for _30_ days. [employer] may call or text to confirm a shipping address. The monitor will be sent to a land address via UPS. [employer] will not ship a monitor to a PO BOX. It typically takes 3-5 days to receive your monitor after it has been enrolled. [employer] will assist with USPS tracking if your package is delayed. The telephone number for [employer] is   [phone]. Once you have received your monitor, please review the enclosed instructions. Instruction tutorials can also be viewed under help and settings on the enclosed cell phone. Your monitor has already been registered assigning a specific monitor serial # to you.  Billing and Self Pay Discount Information [employer] has been provided the insurance information we had on file for you.  If your insurance has been updated, please call [employer] at [phone] to provide them with your updated insurance information.   [employer] offers a discounted Self Pay option for patients who have insurance that does not cover their cardiac event monitor or patients without insurance.  The discounted cost of a Self Pay Cardiac Event Monitor would be  $225.00 , if the patient contacts [employer] at [phone] within 7 days of applying the monitor to make payment arrangements.  If the patient does not contact [employer] within 7 days of applying the monitor, the cost of the cardiac event monitor will be $350.00.  Applying the monitor Remove cell phone from case and turn it on. The cell phone works as IT consultant and needs to be always within 10 feet of you. The cell phone will need to be charged daily. We recommend you plug the cell phone into the enclosed charger at your bedside table every night. Monitor batteries: You will receive two monitor batteries labelled #1 and #2. These are your recorders. Plug battery #2 onto the second connection on the enclosed charger. Always keep one battery on the charger. This will keep the monitor battery deactivated. It will also keep it fully charged for when you need to switch your monitor batteries. A small light will blink on the battery emblem when it is charging. The light on the battery emblem will remain on when the battery is fully charged. Open package of a Monitor strip. Insert battery #1 into black hood on strip and gently squeeze monitor battery onto connection as indicated in instruction booklet. Set aside while preparing skin. Choose the location for your strip, vertical or horizontal, as indicated in the instruction booklet. Shave to remove all hair from location. There cannot be any lotions, oils, powders, or colognes on skin where monitor is to be applied. Wipe skin clean with enclosed Saline wipe. Dry skin completely. Peel paper labelled #1 off the back  of the Monitor strip exposing the adhesive. Place the monitor on the chest in the vertical or horizontal position shown in the instruction booklet. One arrow on the monitor strip must be pointing upward. Carefully remove paper labelled #2, attaching remainder of strip to your skin. Try not to create any folds or wrinkles in the strip as you apply  it. Firmly press and release the circle in the center of the monitor battery. You will hear a small beep. This is turning the monitor battery on. The heart emblem on the monitor battery will light up every 5 seconds if the monitor battery is turned on and connected to the patient securely. Do not push and hold the circle down as this turns the monitor battery off. The cell phone will locate the monitor battery. A screen will appear on the cell phone checking the connection of your monitor strip. This may read poor connection initially but change to good connection within the next minute. Once your monitor accepts the connection you will hear a series of 3 beeps followed by a climbing crescendo of beeps. A screen will appear on the cell phone showing the two monitor strip placement options. Touch the picture that demonstrates where you applied the monitor strip. Your monitor strip and battery are waterproof. You will be able to shower, bathe, or swim with the monitor on. Please do not submerge deeper than 3 feet underwater. We recommend removing the monitor if you are swimming in a lake, river, or ocean. Your monitor battery will need to be switched to a fully charged monitor battery approximately once a week. The cell phone will alert you of an action which needs to be taken.  On the cell phone, tap for details to reveal connection status, monitor battery status, and cell phone battery status. A green dot will indicate your monitor is in good status, however, a red dot will indicate something that needs your attention. To record a symptom, click the circle on the monitor battery. In 30-60 seconds, a list of symptoms will appear on the cell phone. Select your symptoms and tap save. Your monitor will record a sustained or significant arrhythmia regardless of you clicking the button. Some patients do not feel the heart rhythm irregularities. [employer] will notify us  of any serious or critical events. Refer to  instruction booklet for instructions on switching batteries, changing strips, the Do not disturb or Pause features, or any additional questions. Call [employer] at [phone], to confirm your monitor is transmitting and record your baseline. They will answer any questions you may have regarding the monitor instructions at that time. Returning the monitor to [employer] Place all equipment back into the blue box. Peel off strip of paper to expose adhesive and close box securely. There is a prepaid UPS shipping label on this box. Drop in a UPS drop box, or at a UPS facility like [employer]. You may also contact [employer] to arrange UPS to pick up monitor package at your home.   Follow-Up: At [facility], you and your health needs are our priority.  As part of our continuing mission to provide you with exceptional heart care, our providers are all part of one team.  This team includes your primary Cardiologist (physician) and Advanced Practice Providers or APPs (Physician Assistants and Nurse Practitioners) who all work together to provide you with the care you need, when you need it.  Your next appointment:   3 month(s)  Provider:   [name], NP, Orren  Lucien, PA-C, Jackee Alberts, NP, Scot Ford, PA-C, Damien Braver, NP, or Glendia Ferrier, PA-C

## 2024-02-27 NOTE — Addendum Note (Signed)
 Addended by: JANIT GENI CROME on: 02/27/2024 10:12 AM   Modules accepted: Orders

## 2024-02-28 ENCOUNTER — Encounter

## 2024-02-28 ENCOUNTER — Other Ambulatory Visit: Payer: Self-pay

## 2024-02-28 DIAGNOSIS — I2510 Atherosclerotic heart disease of native coronary artery without angina pectoris: Secondary | ICD-10-CM | POA: Diagnosis not present

## 2024-02-28 DIAGNOSIS — Z79899 Other long term (current) drug therapy: Secondary | ICD-10-CM | POA: Diagnosis not present

## 2024-02-28 DIAGNOSIS — I10 Essential (primary) hypertension: Secondary | ICD-10-CM

## 2024-02-28 DIAGNOSIS — I480 Paroxysmal atrial fibrillation: Secondary | ICD-10-CM | POA: Diagnosis not present

## 2024-02-28 DIAGNOSIS — I4891 Unspecified atrial fibrillation: Secondary | ICD-10-CM

## 2024-02-29 ENCOUNTER — Ambulatory Visit: Payer: Self-pay | Admitting: Cardiology

## 2024-02-29 LAB — CBC WITH DIFFERENTIAL/PLATELET
Basophils Absolute: 0.1 x10E3/uL (ref 0.0–0.2)
Basos: 1 %
EOS (ABSOLUTE): 0.1 x10E3/uL (ref 0.0–0.4)
Eos: 2 %
Hematocrit: 36.2 % — ABNORMAL LOW (ref 37.5–51.0)
Hemoglobin: 12.3 g/dL — ABNORMAL LOW (ref 13.0–17.7)
Immature Grans (Abs): 0 x10E3/uL (ref 0.0–0.1)
Immature Granulocytes: 0 %
Lymphocytes Absolute: 1.5 x10E3/uL (ref 0.7–3.1)
Lymphs: 25 %
MCH: 37.4 pg — ABNORMAL HIGH (ref 26.6–33.0)
MCHC: 34 g/dL (ref 31.5–35.7)
MCV: 110 fL — ABNORMAL HIGH (ref 79–97)
Monocytes Absolute: 0.8 x10E3/uL (ref 0.1–0.9)
Monocytes: 13 %
Neutrophils Absolute: 3.6 x10E3/uL (ref 1.4–7.0)
Neutrophils: 59 %
Platelets: 208 x10E3/uL (ref 150–450)
RBC: 3.29 x10E6/uL — ABNORMAL LOW (ref 4.14–5.80)
RDW: 12.5 % (ref 11.6–15.4)
WBC: 6.1 x10E3/uL (ref 3.4–10.8)

## 2024-02-29 LAB — ALT: ALT: 6 IU/L (ref 0–44)

## 2024-03-02 ENCOUNTER — Encounter

## 2024-03-02 DIAGNOSIS — I252 Old myocardial infarction: Secondary | ICD-10-CM | POA: Diagnosis not present

## 2024-03-02 DIAGNOSIS — I213 ST elevation (STEMI) myocardial infarction of unspecified site: Secondary | ICD-10-CM

## 2024-03-02 DIAGNOSIS — Z951 Presence of aortocoronary bypass graft: Secondary | ICD-10-CM

## 2024-03-02 DIAGNOSIS — Z48812 Encounter for surgical aftercare following surgery on the circulatory system: Secondary | ICD-10-CM | POA: Diagnosis not present

## 2024-03-02 NOTE — Progress Notes (Signed)
 Daily Session Note  Patient Details  Name: Daniel Holden MRN: [id number] Date of Birth: 12-19-1945 Referring Provider:   Flowsheet Row Cardiac Rehab from 02/19/2024 in [facility] Cardiac and Pulmonary Rehab  Referring Provider Dr. Wilbert Bihari, MD    Encounter Date: 03/02/2024  Check In:  Session Check In - 03/02/24 0932       Check-In   Supervising physician immediately available to respond to emergencies See telemetry face sheet for immediately available ER MD    Location [facility]-Cardiac & Pulmonary Rehab    Staff Present Burnard Davenport RN,BSN,MPA;Maxon Burnell BS, Exercise Physiologist;Joseph [facility] BS, ACSM CEP, Exercise Physiologist    Virtual Visit No    Medication changes reported     No    Fall or balance concerns reported    No    Tobacco Cessation No Change    Warm-up and Cool-down Performed on first and last piece of equipment    Resistance Training Performed Yes    VAD Patient? No    PAD/SET Patient? No      Pain Assessment   Currently in Pain? No/denies             Social History   Tobacco Use  Smoking Status Never  Smokeless Tobacco Never    Goals Met:  Independence with exercise equipment Exercise tolerated well No report of concerns or symptoms today Strength training completed today  Goals Unmet:  Not Applicable  Comments: Pt able to follow exercise prescription today without complaint.  Will continue to monitor for progression.    Dr. Oneil Pinal is Medical Director for [facility] Cardiac Rehabilitation.  Dr. Fuad Aleskerov is Medical Director for [facility] Pulmonary Rehabilitation.

## 2024-03-04 ENCOUNTER — Encounter: Admitting: Emergency Medicine

## 2024-03-04 DIAGNOSIS — Z951 Presence of aortocoronary bypass graft: Secondary | ICD-10-CM | POA: Diagnosis not present

## 2024-03-04 DIAGNOSIS — Z48812 Encounter for surgical aftercare following surgery on the circulatory system: Secondary | ICD-10-CM | POA: Diagnosis not present

## 2024-03-04 DIAGNOSIS — I252 Old myocardial infarction: Secondary | ICD-10-CM | POA: Diagnosis not present

## 2024-03-04 NOTE — Progress Notes (Signed)
 Daily Session Note  Patient Details  Name: Daniel Holden MRN: [id number] Date of Birth: 1945/08/07 Referring Provider:   Flowsheet Row Cardiac Rehab from 02/19/2024 in [facility] Cardiac and Pulmonary Rehab  Referring Provider Dr. Wilbert Bihari, MD    Encounter Date: 03/04/2024  Check In:  Session Check In - 03/04/24 0952       Check-In   Supervising physician immediately available to respond to emergencies See telemetry face sheet for immediately available ER MD    Location [facility]-Cardiac & Pulmonary Rehab    Staff Present Rollene Paterson, MS, Exercise Physiologist;Maxon Conetta BS, Exercise Physiologist;Millette Halberstam RN,BSN    Virtual Visit No    Medication changes reported     No    Fall or balance concerns reported    No    Tobacco Cessation No Change    Warm-up and Cool-down Performed on first and last piece of equipment    Resistance Training Performed Yes    VAD Patient? No    PAD/SET Patient? No      Pain Assessment   Currently in Pain? No/denies             Social History   Tobacco Use  Smoking Status Never  Smokeless Tobacco Never    Goals Met:  Independence with exercise equipment Exercise tolerated well No report of concerns or symptoms today Strength training completed today  Goals Unmet:  Not Applicable  Comments: Pt able to follow exercise prescription today without complaint.  Will continue to monitor for progression.    Dr. Oneil Pinal is Medical Director for [facility] Cardiac Rehabilitation.  Dr. Fuad Aleskerov is Medical Director for [facility] Pulmonary Rehabilitation.

## 2024-03-05 ENCOUNTER — Other Ambulatory Visit: Payer: Self-pay | Admitting: Cardiology

## 2024-03-06 ENCOUNTER — Other Ambulatory Visit: Payer: Self-pay | Admitting: Surgical

## 2024-03-06 ENCOUNTER — Telehealth: Payer: Self-pay | Admitting: Cardiology

## 2024-03-06 ENCOUNTER — Encounter

## 2024-03-06 DIAGNOSIS — I213 ST elevation (STEMI) myocardial infarction of unspecified site: Secondary | ICD-10-CM

## 2024-03-06 DIAGNOSIS — I252 Old myocardial infarction: Secondary | ICD-10-CM | POA: Diagnosis not present

## 2024-03-06 DIAGNOSIS — Z48812 Encounter for surgical aftercare following surgery on the circulatory system: Secondary | ICD-10-CM | POA: Diagnosis not present

## 2024-03-06 DIAGNOSIS — Z951 Presence of aortocoronary bypass graft: Secondary | ICD-10-CM | POA: Diagnosis not present

## 2024-03-06 NOTE — Telephone Encounter (Signed)
-----   Message from Wilbert Bihari sent at 02/29/2024  7:04 PM EDT ----- Stable labs - continue current meds and forward to PCP.  His MCV and MCH are elevated and can be seen with significant ETOH use.  Please find out how much ETOH he drinks ----- Message ----- From: Interface, [facility] Lab Results In Sent: 02/28/2024  11:36 PM EDT To: Wilbert JONELLE Bihari, MD

## 2024-03-06 NOTE — Telephone Encounter (Signed)
 Call to patient to discuss lab work, no answer. Left message per DPR asking patient to call our office.

## 2024-03-06 NOTE — Progress Notes (Signed)
 Daily Session Note  Patient Details  Name: Daniel Holden MRN: [id number] Date of Birth: 25-Feb-1946 Referring Provider:   Flowsheet Row Cardiac Rehab from 02/19/2024 in [facility] Cardiac and Pulmonary Rehab  Referring Provider Dr. Wilbert Bihari, MD    Encounter Date: 03/06/2024  Check In:  Session Check In - 03/06/24 0725       Check-In   Supervising physician immediately available to respond to emergencies See telemetry face sheet for immediately available ER MD    Location [facility]-Cardiac & Pulmonary Rehab    Staff Present Burnard Davenport RN,BSN,MPA;Maxon Conetta BS, Exercise Physiologist;Joseph Hood RCP,RRT,BSRT;Noah Tickle, BS, Exercise Physiologist    Virtual Visit No    Medication changes reported     No    Fall or balance concerns reported    No    Tobacco Cessation No Change    Warm-up and Cool-down Performed on first and last piece of equipment    Resistance Training Performed Yes    VAD Patient? No    PAD/SET Patient? No      Pain Assessment   Currently in Pain? No/denies             Social History   Tobacco Use  Smoking Status Never  Smokeless Tobacco Never    Goals Met:  Independence with exercise equipment Exercise tolerated well No report of concerns or symptoms today Strength training completed today  Goals Unmet:  Not Applicable  Comments: Pt able to follow exercise prescription today without complaint.  Will continue to monitor for progression.    Dr. Oneil Pinal is Medical Director for [facility] Cardiac Rehabilitation.  Dr. Fuad Aleskerov is Medical Director for [facility] Pulmonary Rehabilitation.

## 2024-03-06 NOTE — Telephone Encounter (Signed)
 Patient is returning call for results. Please advise

## 2024-03-09 ENCOUNTER — Encounter

## 2024-03-09 DIAGNOSIS — I213 ST elevation (STEMI) myocardial infarction of unspecified site: Secondary | ICD-10-CM

## 2024-03-09 DIAGNOSIS — I2510 Atherosclerotic heart disease of native coronary artery without angina pectoris: Secondary | ICD-10-CM | POA: Diagnosis not present

## 2024-03-09 DIAGNOSIS — E119 Type 2 diabetes mellitus without complications: Secondary | ICD-10-CM | POA: Diagnosis not present

## 2024-03-09 DIAGNOSIS — I252 Old myocardial infarction: Secondary | ICD-10-CM | POA: Diagnosis not present

## 2024-03-09 DIAGNOSIS — Z48812 Encounter for surgical aftercare following surgery on the circulatory system: Secondary | ICD-10-CM | POA: Diagnosis not present

## 2024-03-09 DIAGNOSIS — G4733 Obstructive sleep apnea (adult) (pediatric): Secondary | ICD-10-CM | POA: Diagnosis not present

## 2024-03-09 DIAGNOSIS — Z951 Presence of aortocoronary bypass graft: Secondary | ICD-10-CM | POA: Diagnosis not present

## 2024-03-09 NOTE — Progress Notes (Signed)
 Daily Session Note  Patient Details  Name: Daniel Holden MRN: [id number] Date of Birth: 1946/04/09 Referring Provider:   Flowsheet Row Cardiac Rehab from 02/19/2024 in [facility] Cardiac and Pulmonary Rehab  Referring Provider Dr. Wilbert Bihari, MD    Encounter Date: 03/09/2024  Check In:  Session Check In - 03/09/24 0911       Check-In   Supervising physician immediately available to respond to emergencies See telemetry face sheet for immediately available ER MD    Location [facility]-Cardiac & Pulmonary Rehab    Staff Present Burnard Davenport RN,BSN,MPA;Joseph [facility] Dyane BS, ACSM CEP, Exercise Physiologist;Laura Cates RN,BSN    Virtual Visit No    Medication changes reported     No    Fall or balance concerns reported    No    Tobacco Cessation No Change    Warm-up and Cool-down Performed on first and last piece of equipment    Resistance Training Performed Yes    VAD Patient? No    PAD/SET Patient? No      Pain Assessment   Currently in Pain? No/denies             Social History   Tobacco Use  Smoking Status Never  Smokeless Tobacco Never    Goals Met:  Independence with exercise equipment Exercise tolerated well No report of concerns or symptoms today Strength training completed today  Goals Unmet:  Not Applicable  Comments: Pt able to follow exercise prescription today without complaint.  Will continue to monitor for progression.    Dr. Oneil Pinal is Medical Director for [facility] Cardiac Rehabilitation.  Dr. Fuad Aleskerov is Medical Director for [facility] Pulmonary Rehabilitation.

## 2024-03-11 ENCOUNTER — Encounter

## 2024-03-11 DIAGNOSIS — I213 ST elevation (STEMI) myocardial infarction of unspecified site: Secondary | ICD-10-CM

## 2024-03-11 DIAGNOSIS — Z951 Presence of aortocoronary bypass graft: Secondary | ICD-10-CM | POA: Diagnosis not present

## 2024-03-11 DIAGNOSIS — Z48812 Encounter for surgical aftercare following surgery on the circulatory system: Secondary | ICD-10-CM | POA: Diagnosis not present

## 2024-03-11 DIAGNOSIS — I252 Old myocardial infarction: Secondary | ICD-10-CM | POA: Diagnosis not present

## 2024-03-11 NOTE — Progress Notes (Signed)
 Daily Session Note  Patient Details  Name: Daniel Holden MRN: [id number] Date of Birth: 1946-04-06 Referring Provider:   Flowsheet Row Cardiac Rehab from 02/19/2024 in [facility] Cardiac and Pulmonary Rehab  Referring Provider Dr. Wilbert Bihari, MD    Encounter Date: 03/11/2024  Check In:  Session Check In - 03/11/24 0910       Check-In   Supervising physician immediately available to respond to emergencies See telemetry face sheet for immediately available ER MD    Location [facility]-Cardiac & Pulmonary Rehab    Staff Present Burnard Davenport RN,BSN,MPA;Joseph [facility] RCP,RRT,BSRT;Margaret Best, MS, Exercise Physiologist;Jason Elnor RDN,LDN    Virtual Visit No    Medication changes reported     No    Fall or balance concerns reported    No    Tobacco Cessation No Change    Warm-up and Cool-down Performed on first and last piece of equipment    Resistance Training Performed Yes    VAD Patient? No    PAD/SET Patient? No      Pain Assessment   Currently in Pain? No/denies             Social History   Tobacco Use  Smoking Status Never  Smokeless Tobacco Never    Goals Met:  Independence with exercise equipment Exercise tolerated well No report of concerns or symptoms today Strength training completed today  Goals Unmet:  Not Applicable  Comments: Pt able to follow exercise prescription today without complaint.  Will continue to monitor for progression.    Dr. Oneil Pinal is Medical Director for [facility] Cardiac Rehabilitation.  Dr. Fuad Aleskerov is Medical Director for [facility] Pulmonary Rehabilitation.

## 2024-03-12 NOTE — Telephone Encounter (Signed)
 Call to patient regarding lab results with elevated MCV and MCH. Dr. Shlomo asking what patient's ETOH consumption is. 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 He states he has currently cut back significantly but does have at least one beer a night. He states that a few times a week he will have 5-6 beers in a day when he is doing yard work. He reports he is working with his PCP to gradually reduce alcohol consumption and to address anemia with sublingual B-12. Patient responses forwarded to Dr. Shlomo.

## 2024-03-13 ENCOUNTER — Encounter: Admitting: Emergency Medicine

## 2024-03-13 DIAGNOSIS — Z48812 Encounter for surgical aftercare following surgery on the circulatory system: Secondary | ICD-10-CM | POA: Diagnosis not present

## 2024-03-13 DIAGNOSIS — Z951 Presence of aortocoronary bypass graft: Secondary | ICD-10-CM

## 2024-03-13 DIAGNOSIS — I252 Old myocardial infarction: Secondary | ICD-10-CM | POA: Diagnosis not present

## 2024-03-13 NOTE — Progress Notes (Signed)
 Daily Session Note  Patient Details  Name: Daniel Holden MRN: [id number] Date of Birth: [birth year] Referring Provider:   Flowsheet Row Cardiac Rehab from 02/19/2024 in [facility] Cardiac and Pulmonary Rehab  Referring Provider Dr. Wilbert Bihari, MD    Encounter Date: 03/13/2024  Check In:  Session Check In - 03/13/24 0922       Check-In   Supervising physician immediately available to respond to emergencies See telemetry face sheet for immediately available ER MD    Location [facility]-Cardiac & Pulmonary Rehab    Staff Present Devaughn Jaeger, BS, Exercise Physiologist;Margaret Best, MS, Exercise Physiologist;Joseph Rolinda RCP,RRT,BSRT;Jakevious Hollister RN,BSN    Virtual Visit No    Medication changes reported     No    Fall or balance concerns reported    No    Tobacco Cessation No Change    Warm-up and Cool-down Performed on first and last piece of equipment    Resistance Training Performed Yes    VAD Patient? No    PAD/SET Patient? No      Pain Assessment   Currently in Pain? No/denies             Social History   Tobacco Use  Smoking Status Never  Smokeless Tobacco Never    Goals Met:  Independence with exercise equipment Exercise tolerated well No report of concerns or symptoms today Strength training completed today  Goals Unmet:  Not Applicable  Comments: Pt able to follow exercise prescription today without complaint.  Will continue to monitor for progression.    Dr. Oneil Pinal is Medical Director for [facility] Cardiac Rehabilitation.  Dr. Fuad Aleskerov is Medical Director for [facility] Pulmonary Rehabilitation.

## 2024-03-16 ENCOUNTER — Encounter

## 2024-03-16 DIAGNOSIS — Z48812 Encounter for surgical aftercare following surgery on the circulatory system: Secondary | ICD-10-CM | POA: Diagnosis not present

## 2024-03-16 DIAGNOSIS — I252 Old myocardial infarction: Secondary | ICD-10-CM | POA: Diagnosis not present

## 2024-03-16 DIAGNOSIS — Z951 Presence of aortocoronary bypass graft: Secondary | ICD-10-CM

## 2024-03-16 DIAGNOSIS — I213 ST elevation (STEMI) myocardial infarction of unspecified site: Secondary | ICD-10-CM

## 2024-03-16 NOTE — Progress Notes (Signed)
 Daily Session Note  Patient Details  Name: Daniel Holden MRN: [id number] Date of Birth: May 10, 1946 Referring Provider:   Flowsheet Row Cardiac Rehab from 02/19/2024 in [facility] Cardiac and Pulmonary Rehab  Referring Provider Dr. Wilbert Bihari, MD    Encounter Date: 03/16/2024  Check In:  Session Check In - 03/16/24 0911       Check-In   Supervising physician immediately available to respond to emergencies See telemetry face sheet for immediately available ER MD    Location [facility]-Cardiac & Pulmonary Rehab    Staff Present Burnard Davenport RN,BSN,MPA;Joseph [facility] Dyane BS, ACSM CEP, Exercise Physiologist;Jason Elnor RDN,LDN    Virtual Visit No    Medication changes reported     No    Fall or balance concerns reported    No    Tobacco Cessation No Change    Warm-up and Cool-down Performed on first and last piece of equipment    Resistance Training Performed Yes    VAD Patient? No    PAD/SET Patient? No      Pain Assessment   Currently in Pain? No/denies             Social History   Tobacco Use  Smoking Status Never  Smokeless Tobacco Never    Goals Met:  Independence with exercise equipment Exercise tolerated well No report of concerns or symptoms today Strength training completed today  Goals Unmet:  Not Applicable  Comments: Pt able to follow exercise prescription today without complaint.  Will continue to monitor for progression.    Dr. Oneil Pinal is Medical Director for [facility] Cardiac Rehabilitation.  Dr. Fuad Aleskerov is Medical Director for [facility] Pulmonary Rehabilitation.

## 2024-03-16 NOTE — Telephone Encounter (Signed)
 Call to patient to advise that per Dr. Shlomo, he needs to cut back on ETOH as it is having an effect on his bone marrow with production of RBCs. Patient verbalizes understanding.

## 2024-03-18 ENCOUNTER — Encounter

## 2024-03-18 DIAGNOSIS — Z48812 Encounter for surgical aftercare following surgery on the circulatory system: Secondary | ICD-10-CM | POA: Diagnosis not present

## 2024-03-18 DIAGNOSIS — I213 ST elevation (STEMI) myocardial infarction of unspecified site: Secondary | ICD-10-CM

## 2024-03-18 DIAGNOSIS — Z951 Presence of aortocoronary bypass graft: Secondary | ICD-10-CM

## 2024-03-18 DIAGNOSIS — I252 Old myocardial infarction: Secondary | ICD-10-CM | POA: Diagnosis not present

## 2024-03-18 NOTE — Progress Notes (Signed)
 Cardiac Individual Treatment Plan  Patient Details  Name: Daniel Holden MRN: [id number] Date of Birth: Dec 04, 1945 Referring Provider:   Flowsheet Row Cardiac Rehab from 02/19/2024 in [facility] Cardiac and Pulmonary Rehab  Referring Provider Dr. Wilbert Bihari, MD    Initial Encounter Date:  Flowsheet Row Cardiac Rehab from 02/19/2024 in [facility] Cardiac and Pulmonary Rehab  Date 02/19/24    Visit Diagnosis: S/P CABG x 3  ST elevation myocardial infarction (STEMI), unspecified artery (HCC)  Patient's Home Medications on Admission:  Current Outpatient Medications:    ALPRAZolam  (XANAX ) 0.5 MG tablet, Take 0.25 mg by mouth at bedtime as needed for sleep., Disp: , Rfl:    amLODipine  (NORVASC ) 5 MG tablet, TAKE 1 TABLET EVERY DAY, Disp: 90 tablet, Rfl: 2   aspirin  81 MG EC tablet, Take 81 mg by mouth daily., Disp: , Rfl:    atorvastatin  (LIPITOR ) 80 MG tablet, Take 1 tablet (80 mg total) by mouth daily., Disp: 90 tablet, Rfl: 3   Cholecalciferol  250 MCG (10000 UT) TABS, Take 1,000 Units by mouth daily., Disp: , Rfl:    clopidogrel  (PLAVIX ) 75 MG tablet, Take 1 tablet (75 mg total) by mouth daily., Disp: 90 tablet, Rfl: 2   cyanocobalamin  1000 MCG tablet, Take 1,000 mcg by mouth daily., Disp: , Rfl:    Fe Fum-Vit C-Vit B12-FA (TRIGELS-F FORTE) CAPS capsule, Take 1 capsule by mouth daily after breakfast., Disp: 30 capsule, Rfl: 2   fluticasone  (FLONASE ) 50 MCG/ACT nasal spray, Place 2 sprays into both nostrils daily. , Disp: , Rfl:    losartan  (COZAAR ) 25 MG tablet, Take 1 tablet (25 mg total) by mouth daily., Disp: 90 tablet, Rfl: 3   melatonin 3 MG TABS tablet, Take 10 mg by mouth at bedtime., Disp: , Rfl:    metFORMIN  (GLUCOPHAGE ) 500 MG tablet, Take 500 mg by mouth 2 (two) times daily., Disp: , Rfl:    metoprolol  tartrate (LOPRESSOR ) 25 MG tablet, Take 1 tablet (25 mg total) by mouth 2 (two) times daily. (Patient taking differently: Take 25 mg by mouth daily.), Disp: 180 tablet, Rfl: 3    pantoprazole  (PROTONIX ) 40 MG tablet, Take 40 mg by mouth 2 (two) times daily before a meal., Disp: , Rfl:   Past Medical History: Past Medical History:  Diagnosis Date   Allergy    Angina of effort    Anxiety    CAD (coronary artery disease), native coronary artery    s/p cath 12/2023 CTO of RCA, 95% LAD, 90% LCx and echo was normal.  He underwent CABG with LIMA-LAD, SVG-PL and a left radial-OM .   Cancer [facility])    Carpal tunnel syndrome    GERD (gastroesophageal reflux disease)    HLD (hyperlipidemia)    Hypertension    Lung nodule    Obesity    PAF (paroxysmal atrial fibrillation) (HCC) 12/2023   s/p MAZE and LA clippling   Pancreatitis 2015   Sleep apnea     Tobacco Use: Social History   Tobacco Use  Smoking Status Never  Smokeless Tobacco Never    Labs: Review Flowsheet  More data exists      Latest Ref Rng & Units 11/07/2020 03/19/2021 01/02/2024 01/05/2024 01/06/2024  Labs for ITP Cardiac and Pulmonary Rehab  Cholestrol 0 - 200 mg/dL 895  - 880  - -  LDL (calc) 0 - 99 mg/dL 26  - 33  - -  HDL-C >59 mg/dL 58  - 60  - -  Trlycerides <150 mg/dL  111  - 132  - -  Hemoglobin A1c 4.8 - 5.6 % - 6.6  6.1  - -  PH, Arterial 7.35 - 7.45 - - - 7.44  7.410  7.362  7.347  7.372  7.425  7.347  7.319   PCO2 arterial 32 - 48 mmHg - - - 31  32.1  33.2  38.9  37.6  35.7  34.9  41.1   Bicarbonate 20.0 - 28.0 mmol/L - - - 21.1  20.3  18.9  21.7  21.8  23.4  19.1  21.1   TCO2 22 - 32 mmol/L - - - - 21  20  23  22  23  25  24  24  20  22  21  22    Acid-base deficit 0.0 - 2.0 mmol/L - - - 2.1  4.0  6.0  4.0  3.0  1.0  6.0  5.0   O2 Saturation % - - - 99.4  98  99  97  99  100  100  100     Details       Multiple values from one day are sorted in reverse-chronological order          Exercise Target Goals: Exercise Program Goal: Individual exercise prescription set using results from initial 6 min walk test and THRR while considering  patient's activity barriers and safety.    Exercise Prescription Goal: Initial exercise prescription builds to 30-45 minutes a day of aerobic activity, 2-3 days per week.  Home exercise guidelines will be given to patient during program as part of exercise prescription that the participant will acknowledge.   Education: Aerobic Exercise: - Group verbal and visual presentation on the components of exercise prescription. Introduces F.I.T.T principle from ACSM for exercise prescriptions.  Reviews F.I.T.T. principles of aerobic exercise including progression. Written material provided at class time. Flowsheet Row Cardiac Rehab from 05/24/2021 in [facility] Cardiac and Pulmonary Rehab  Education need identified 03/09/21    Education: Resistance Exercise: - Group verbal and visual presentation on the components of exercise prescription. Introduces F.I.T.T principle from ACSM for exercise prescriptions  Reviews F.I.T.T. principles of resistance exercise including progression. Written material provided at class time. Flowsheet Row Cardiac Rehab from 05/24/2021 in [facility] Cardiac and Pulmonary Rehab  Date 04/26/21  Educator [facility]  Instruction Review Code 1- Verbalizes Understanding     Education: Exercise & Equipment Safety: - Individual verbal instruction and demonstration of equipment use and safety with use of the equipment. Flowsheet Row Cardiac Rehab from 03/11/2024 in [facility] Cardiac and Pulmonary Rehab  Date 02/19/24  Educator NT  Instruction Review Code 1- Verbalizes Understanding    Education: Exercise Physiology & General Exercise Guidelines: - Group verbal and written instruction with models to review the exercise physiology of the cardiovascular system and associated critical values. Provides general exercise guidelines with specific guidelines to those with heart or lung disease. Written material provided at class time. Flowsheet Row Cardiac Rehab from 05/24/2021 in [facility] Cardiac and Pulmonary Rehab  Date 04/12/21  Educator [facility]   Instruction Review Code 1- Verbalizes Understanding    Education: Flexibility, Balance, Mind/Body Relaxation: - Group verbal and visual presentation with interactive activity on the components of exercise prescription. Introduces F.I.T.T principle from ACSM for exercise prescriptions. Reviews F.I.T.T. principles of flexibility and balance exercise training including progression. Also discusses the mind body connection.  Reviews various relaxation techniques to help reduce and manage stress (i.e. Deep breathing, progressive muscle relaxation, and visualization). Balance handout provided  to take home. Written material provided at class time. Flowsheet Row Cardiac Rehab from 05/24/2021 in [facility] Cardiac and Pulmonary Rehab  Date 05/03/21  Educator AS  Instruction Review Code 1- Verbalizes Understanding    Activity Barriers & Risk Stratification:  Activity Barriers & Cardiac Risk Stratification - 02/14/24 1033       Activity Barriers & Cardiac Risk Stratification   Activity Barriers Left Knee Replacement;Joint Problems;Back Problems    Cardiac Risk Stratification High          6 Minute Walk:  6 Minute Walk     Row Name 02/19/24 1143         6 Minute Walk   Phase Initial     Distance 1195 feet     Walk Time 6 minutes     # of Rest Breaks 0     MPH 2.26     METS 2.26     RPE 10     Perceived Dyspnea  1     VO2 Peak 7.92     Symptoms No     Resting HR 76 bpm     Resting BP 142/68     Resting Oxygen Saturation  96 %     Exercise Oxygen Saturation  during 6 min walk 96 %     Max Ex. HR 96 bpm     Max Ex. BP 160/72     2 Minute Post BP 154/72        Oxygen Initial Assessment:   Oxygen Re-Evaluation:   Oxygen Discharge (Final Oxygen Re-Evaluation):   Initial Exercise Prescription:  Initial Exercise Prescription - 02/19/24 1100       Date of Initial Exercise RX and Referring Provider   Date 02/19/24    Referring Provider Dr. Wilbert Bihari, MD      Oxygen    Maintain Oxygen Saturation 88% or higher      Treadmill   MPH 2.2    Grade 0    Minutes 15    METs 2.68      REL-XR   Level 3    Speed 50    Minutes 15    METs 2.26      Biostep-RELP   Level 2    SPM 50    Minutes 15    METs 2.26      Track   Laps 26    Minutes 15    METs 2.41      Prescription Details   Frequency (times per week) 3    Duration Progress to 30 minutes of continuous aerobic without signs/symptoms of physical distress      Intensity   THRR 40-80% of Max Heartrate 103-131    Ratings of Perceived Exertion 11-13    Perceived Dyspnea 0-4      Progression   Progression Continue to progress workloads to maintain intensity without signs/symptoms of physical distress.      Resistance Training   Training Prescription Yes    Weight 5 lb    Reps 10-15          Perform Capillary Blood Glucose checks as needed.  Exercise Prescription Changes:   Exercise Prescription Changes     Row Name 02/19/24 1100 03/03/24 0800           Response to Exercise   Blood Pressure (Admit) 142/68 114/60      Blood Pressure (Exercise) 160/72 158/70      Blood Pressure (Exit) 154/72 118/62      Heart  Rate (Admit) 76 bpm 81 bpm      Heart Rate (Exercise) 96 bpm 107 bpm      Heart Rate (Exit) 89 bpm 91 bpm      Oxygen Saturation (Admit) 96 % --      Oxygen Saturation (Exercise) 96 % --      Rating of Perceived Exertion (Exercise) 10 13      Perceived Dyspnea (Exercise) 1 --      Symptoms none none      Comments Results 1st 2 weeks of exercise sessions      Duration -- Progress to 30 minutes of  aerobic without signs/symptoms of physical distress      Intensity -- THRR unchanged        Progression   Progression -- Continue to progress workloads to maintain intensity without signs/symptoms of physical distress.      Average METs -- 2.86        Resistance Training   Training Prescription -- Yes      Weight -- 5 lb      Reps -- 10-15        Interval Training    Interval Training -- No        Treadmill   MPH -- 2.4      Grade -- 0      Minutes -- 15      METs -- 2.84        REL-XR   Level -- 4      Minutes -- 15        Biostep-RELP   Level -- 3      Minutes -- 15      METs -- 3        Oxygen   Maintain Oxygen Saturation -- 88% or higher         Exercise Comments:   Exercise Comments     Row Name 02/21/24 [phone]           Exercise Comments First full day of exercise!  Patient was oriented to gym and equipment including functions, settings, policies, and procedures.  Patient's individual exercise prescription and treatment plan were reviewed.  All starting workloads were established based on the results of the 6 minute walk test done at initial orientation visit.  The plan for exercise progression was also introduced and progression will be customized based on patient's performance and goals.          Exercise Goals and Review:   Exercise Goals     Row Name 02/19/24 1157             Exercise Goals   Increase Physical Activity Yes       Intervention Provide advice, education, support and counseling about physical activity/exercise needs.;Develop an individualized exercise prescription for aerobic and resistive training based on initial evaluation findings, risk stratification, comorbidities and participant's personal goals.       Expected Outcomes Short Term: Attend rehab on a regular basis to increase amount of physical activity.;Long Term: Add in home exercise to make exercise part of routine and to increase amount of physical activity.;Long Term: Exercising regularly at least 3-5 days a week.       Increase Strength and Stamina Yes       Intervention Provide advice, education, support and counseling about physical activity/exercise needs.;Develop an individualized exercise prescription for aerobic and resistive training based on initial evaluation findings, risk stratification, comorbidities and participant's personal  goals.  Expected Outcomes Short Term: Increase workloads from initial exercise prescription for resistance, speed, and METs.;Short Term: Perform resistance training exercises routinely during rehab and add in resistance training at home;Long Term: Improve cardiorespiratory fitness, muscular endurance and strength as measured by increased METs and functional capacity ( )       Able to understand and use rate of perceived exertion (RPE) scale Yes       Intervention Provide education and explanation on how to use RPE scale       Expected Outcomes Short Term: Able to use RPE daily in rehab to express subjective intensity level;Long Term:  Able to use RPE to guide intensity level when exercising independently       Able to understand and use Dyspnea scale Yes       Intervention Provide education and explanation on how to use Dyspnea scale       Expected Outcomes Short Term: Able to use Dyspnea scale daily in rehab to express subjective sense of shortness of breath during exertion;Long Term: Able to use Dyspnea scale to guide intensity level when exercising independently       Knowledge and understanding of Target Heart Rate Range (THRR) Yes       Intervention Provide education and explanation of THRR including how the numbers were predicted and where they are located for reference       Expected Outcomes Short Term: Able to state/look up THRR;Long Term: Able to use THRR to govern intensity when exercising independently;Short Term: Able to use daily as guideline for intensity in rehab       Able to check pulse independently Yes       Intervention Provide education and demonstration on how to check pulse in carotid and radial arteries.;Review the importance of being able to check your own pulse for safety during independent exercise       Expected Outcomes Short Term: Able to explain why pulse checking is important during independent exercise;Long Term: Able to check pulse independently and accurately        Understanding of Exercise Prescription Yes       Intervention Provide education, explanation, and written materials on patient's individual exercise prescription       Expected Outcomes Short Term: Able to explain program exercise prescription;Long Term: Able to explain home exercise prescription to exercise independently          Exercise Goals Re-Evaluation :  Exercise Goals Re-Evaluation     Row Name 02/21/24 0928 03/03/24 0815 03/16/24 0936         Exercise Goal Re-Evaluation   Exercise Goals Review Increase Physical Activity;Able to understand and use rate of perceived exertion (RPE) scale;Knowledge and understanding of Target Heart Rate Range (THRR);Understanding of Exercise Prescription;Increase Strength and Stamina;Able to understand and use Dyspnea scale;Able to check pulse independently Increase Physical Activity;Understanding of Exercise Prescription;Increase Strength and Stamina Increase Physical Activity;Increase Strength and Stamina;Understanding of Exercise Prescription     Comments Reviewed RPE and dyspnea scale, THR and program prescription with pt today.  Pt voiced understanding and was given a copy of goals to take home. Dick is off to a good start in the program and completed his first 2 weeks in this review. He worked at level 4 on the XR and level 3 on the biostep. He had a workload on the treadmill of a speed of 2.4 mph with no incline. We will continue to monitor his progress in the program. Dick is doing well in rehab. He is walking  some at home on days he is not attending rehab. His wife has black card for [employer] and sometimes he will go with her to work on his core.     Expected Outcomes Short: Use RPE daily to regulate intensity. Long: Follow program prescription in THR. Short: Continue to follow current exercise prescription. Long: Continue exercise to improve strength and stamina. STG: Continue to attend rehab and incorperate more home exercise as able.  LTG: Continue exercise to improve strength and stamina.        Discharge Exercise Prescription (Final Exercise Prescription Changes):  Exercise Prescription Changes - 03/03/24 0800       Response to Exercise   Blood Pressure (Admit) 114/60    Blood Pressure (Exercise) 158/70    Blood Pressure (Exit) 118/62    Heart Rate (Admit) 81 bpm    Heart Rate (Exercise) 107 bpm    Heart Rate (Exit) 91 bpm    Rating of Perceived Exertion (Exercise) 13    Symptoms none    Comments 1st 2 weeks of exercise sessions    Duration Progress to 30 minutes of  aerobic without signs/symptoms of physical distress    Intensity THRR unchanged      Progression   Progression Continue to progress workloads to maintain intensity without signs/symptoms of physical distress.    Average METs 2.86      Resistance Training   Training Prescription Yes    Weight 5 lb    Reps 10-15      Interval Training   Interval Training No      Treadmill   MPH 2.4    Grade 0    Minutes 15    METs 2.84      REL-XR   Level 4    Minutes 15      Biostep-RELP   Level 3    Minutes 15    METs 3      Oxygen   Maintain Oxygen Saturation 88% or higher          Nutrition:  Target Goals: Understanding of nutrition guidelines, daily intake of sodium 1500mg , cholesterol 200mg , calories 30% from fat and 7% or less from saturated fats, daily to have 5 or more servings of fruits and vegetables.  Education: Nutrition 1 -Group instruction provided by verbal, written material, interactive activities, discussions, models, and posters to present general guidelines for heart healthy nutrition including macronutrients, label reading, and promoting whole foods over processed counterparts. Education serves as Pensions consultant of discussion of heart healthy eating for all. Written material provided at class time.    Education: Nutrition 2 -Group instruction provided by verbal, written material, interactive activities, discussions,  models, and posters to present general guidelines for heart healthy nutrition including sodium, cholesterol, and saturated fat. Providing guidance of habit forming to improve blood pressure, cholesterol, and body weight. Written material provided at class time.     Biometrics:  Pre Biometrics - 02/19/24 1157       Pre Biometrics   Height 5' 6.5 (1.689 m)    Weight 210 lb 3.2 oz (95.3 kg)    Waist Circumference 42.5 inches    Hip Circumference 42 inches    Waist to Hip Ratio 1.01 %    BMI (Calculated) 33.42    Single Leg Stand 30 seconds           Nutrition Therapy Plan and Nutrition Goals:  Nutrition Therapy & Goals - 02/19/24 1141       Nutrition Therapy  RD appointment deferred Yes      Intervention Plan   Intervention Prescribe, educate and counsel regarding individualized specific dietary modifications aiming towards targeted core components such as weight, hypertension, lipid management, diabetes, heart failure and other comorbidities.    Expected Outcomes Short Term Goal: Understand basic principles of dietary content, such as calories, fat, sodium, cholesterol and nutrients.;Short Term Goal: A plan has been developed with personal nutrition goals set during dietitian appointment.;Long Term Goal: Adherence to prescribed nutrition plan.          Nutrition Assessments:  MEDIFICTS Score Key: >=70 Need to make dietary changes  40-70 Heart Healthy Diet <= 40 Therapeutic Level Cholesterol Diet  Flowsheet Row Cardiac Rehab from 02/19/2024 in [facility] Cardiac and Pulmonary Rehab  Picture Your Plate Total Score on Admission 66   Picture Your Plate Scores: <59 Unhealthy dietary pattern with much room for improvement. 41-50 Dietary pattern unlikely to meet recommendations for good health and room for improvement. 51-60 More healthful dietary pattern, with some room for improvement.  >60 Healthy dietary pattern, although there may be some specific behaviors that could be  improved.    Nutrition Goals Re-Evaluation:  Nutrition Goals Re-Evaluation     Row Name 03/16/24 0939             Goals   Comment Dick deferred RD appointment          Nutrition Goals Discharge (Final Nutrition Goals Re-Evaluation):  Nutrition Goals Re-Evaluation - 03/16/24 0939       Goals   Comment Dick deferred RD appointment          Psychosocial: Target Goals: Acknowledge presence or absence of significant depression and/or stress, maximize coping skills, provide positive support system. Participant is able to verbalize types and ability to use techniques and skills needed for reducing stress and depression.   Education: Stress, Anxiety, and Depression - Group verbal and visual presentation to define topics covered.  Reviews how body is impacted by stress, anxiety, and depression.  Also discusses healthy ways to reduce stress and to treat/manage anxiety and depression. Written material provided at class time. Flowsheet Row Cardiac Rehab from 03/11/2024 in [facility] Cardiac and Pulmonary Rehab  Education need identified 02/19/24    Education: Sleep Hygiene -Provides group verbal and written instruction about how sleep can affect your health.  Define sleep hygiene, discuss sleep cycles and impact of sleep habits. Review good sleep hygiene tips.   Initial Review & Psychosocial Screening:  Initial Psych Review & Screening - 02/14/24 1042       Initial Review   Current issues with Current Stress Concerns      Family Dynamics   Good Support System? Yes      Barriers   Psychosocial barriers to participate in program There are no identifiable barriers or psychosocial needs.      Screening Interventions   Interventions Encouraged to exercise;Provide feedback about the scores to participant;To provide support and resources with identified psychosocial needs    Expected Outcomes Short Term goal: Utilizing psychosocial counselor, staff and physician to assist with  identification of specific Stressors or current issues interfering with healing process. Setting desired goal for each stressor or current issue identified.;Long Term Goal: Stressors or current issues are controlled or eliminated.;Short Term goal: Identification and review with participant of any Quality of Life or Depression concerns found by scoring the questionnaire.;Long Term goal: The participant improves quality of Life and PHQ9 Scores as seen by post scores and/or verbalization of changes  Quality of Life Scores:   Quality of Life - 02/19/24 1142       Quality of Life   Select Quality of Life      Quality of Life Scores   Health/Function Pre 23.8 %    Socioeconomic Pre 25.63 %    Psych/Spiritual Pre 26.64 %    Family Pre 30 %    GLOBAL Pre 25.67 %         Scores of 19 and below usually indicate a poorer quality of life in these areas.  A difference of  2-3 points is a clinically meaningful difference.  A difference of 2-3 points in the total score of the Quality of Life Index has been associated with significant improvement in overall quality of life, self-image, physical symptoms, and general health in studies assessing change in quality of life.  PHQ-9: Review Flowsheet  More data may exist      02/19/2024 05/26/2021 03/09/2021 01/23/2021 07/14/2020  Depression screen PHQ 2/9  Decreased Interest 0 0 0 0 0  Down, Depressed, Hopeless 0 0 0 0 0  PHQ - 2 Score 0 0 0 0 0  Altered sleeping 0 0 0 - -  Tired, decreased energy 2 1 1  - -  Change in appetite 0 1 1 - -  Feeling bad or failure about yourself  0 0 0 - -  Trouble concentrating 0 0 0 - -  Moving slowly or fidgety/restless 1 0 0 - -  Suicidal thoughts 0 0 0 - -  PHQ-9 Score 3 2 2  - -  Difficult doing work/chores Not difficult at all Not difficult at all Not difficult at all - -   Interpretation of Total Score  Total Score Depression Severity:  1-4 = Minimal depression, 5-9 = Mild depression, 10-14 = Moderate  depression, 15-19 = Moderately severe depression, 20-27 = Severe depression   Psychosocial Evaluation and Intervention:  Psychosocial Evaluation - 02/14/24 1047       Psychosocial Evaluation & Interventions   Interventions Encouraged to exercise with the program and follow exercise prescription;Relaxation education    Comments 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 He states he has been walking consistently, but mentions some stress because he feels like he should be further along in his progress. He stays active and is ready to get involved in the program to increase his stamina. He started sleeping in his bed this past week and has been getting a consistent 8 hours which he is pleased with.    Expected Outcomes Short: attend cardiac rehab for education and exercise Long: develop and maintain positive self care habits    Continue Psychosocial Services  Follow up required by staff          Psychosocial Re-Evaluation:  Psychosocial Re-Evaluation     Row Name 03/16/24 [phone]             Psychosocial Re-Evaluation   Current issues with None Identified       Comments Dick denies any stress, anxiety, or depression at this time. He reports he sleeps well and wakes well rested.       Expected Outcomes STG: Continue to attend rehab and focus on good sleep. LTG: Achieve and maintain postivie outlook on health and daily life       Interventions Encouraged to attend Cardiac Rehabilitation for the exercise       Continue Psychosocial Services  Follow up required by staff  Psychosocial Discharge (Final Psychosocial Re-Evaluation):  Psychosocial Re-Evaluation - 03/16/24 9061       Psychosocial Re-Evaluation   Current issues with None Identified    Comments Dick denies any stress, anxiety, or depression at this time. He reports he sleeps well and wakes well rested.    Expected Outcomes STG: Continue to attend rehab and focus on good sleep. LTG: Achieve and maintain  postivie outlook on health and daily life    Interventions Encouraged to attend Cardiac Rehabilitation for the exercise    Continue Psychosocial Services  Follow up required by staff          Vocational Rehabilitation: Provide vocational rehab assistance to qualifying candidates.   Vocational Rehab Evaluation & Intervention:  Vocational Rehab - 02/14/24 1042       Initial Vocational Rehab Evaluation & Intervention   Assessment shows need for Vocational Rehabilitation No          Education: Education Goals: Education classes will be provided on a variety of topics geared toward better understanding of heart health and risk factor modification. Participant will state understanding/return demonstration of topics presented as noted by education test scores.  Learning Barriers/Preferences:  Learning Barriers/Preferences - 02/14/24 1040       Learning Barriers/Preferences   Learning Barriers None    Learning Preferences Individual Instruction          General Cardiac Education Topics:  AED/CPR: - Group verbal and written instruction with the use of models to demonstrate the basic use of the AED with the basic ABC's of resuscitation.   Test and Procedures: - Group verbal and visual presentation and models provide information about basic cardiac anatomy and function. Reviews the testing methods done to diagnose heart disease and the outcomes of the test results. Describes the treatment choices: Medical Management, Angioplasty, or Coronary Bypass Surgery for treating various heart conditions including Myocardial Infarction, Angina, Valve Disease, and Cardiac Arrhythmias. Written material provided at class time. Flowsheet Row Cardiac Rehab from 03/11/2024 in [facility] Cardiac and Pulmonary Rehab  Education need identified 02/19/24    Medication Safety: - Group verbal and visual instruction to review commonly prescribed medications for heart and lung disease. Reviews the medication,  class of the drug, and side effects. Includes the steps to properly store meds and maintain the prescription regimen. Written material provided at class time. Flowsheet Row Cardiac Rehab from 05/24/2021 in [facility] Cardiac and Pulmonary Rehab  Date 05/17/21  Educator SB  Instruction Review Code 1- Verbalizes Understanding    Intimacy: - Group verbal instruction through game format to discuss how heart and lung disease can affect sexual intimacy. Written material provided at class time.   Know Your Numbers and Heart Failure: - Group verbal and visual instruction to discuss disease risk factors for cardiac and pulmonary disease and treatment options.  Reviews associated critical values for Overweight/Obesity, Hypertension, Cholesterol, and Diabetes.  Discusses basics of heart failure: signs/symptoms and treatments.  Introduces Heart Failure Zone chart for action plan for heart failure. Written material provided at class time. Flowsheet Row Cardiac Rehab from 05/24/2021 in [facility] Cardiac and Pulmonary Rehab  Date 05/24/21  Educator SB  Instruction Review Code 1- Verbalizes Understanding    Infection Prevention: - Provides verbal and written material to individual with discussion of infection control including proper hand washing and proper equipment cleaning during exercise session. Flowsheet Row Cardiac Rehab from 03/11/2024 in [facility] Cardiac and Pulmonary Rehab  Date 02/19/24  Educator NT  Instruction Review Code 1- Verbalizes Understanding  Falls Prevention: - Provides verbal and written material to individual with discussion of falls prevention and safety. Flowsheet Row Cardiac Rehab from 03/11/2024 in [facility] Cardiac and Pulmonary Rehab  Date 02/19/24  Educator NT  Instruction Review Code 1- Verbalizes Understanding    Other: -Provides group and verbal instruction on various topics (see comments)   Knowledge Questionnaire Score:  Knowledge Questionnaire Score - 02/19/24 1141        Knowledge Questionnaire Score   Pre Score 24/26          Core Components/Risk Factors/Patient Goals at Admission:  Personal Goals and Risk Factors at Admission - 02/14/24 1035       Core Components/Risk Factors/Patient Goals on Admission    Weight Management Yes    Intervention Weight Management: Develop a combined nutrition and exercise program designed to reach desired caloric intake, while maintaining appropriate intake of nutrient and fiber, sodium and fats, and appropriate energy expenditure required for the weight goal.;Weight Management: Provide education and appropriate resources to help participant work on and attain dietary goals.;Weight Management/Obesity: Establish reasonable short term and long term weight goals.    Goal Weight: Long Term 185 lb (83.9 kg)    Expected Outcomes Short Term: Continue to assess and modify interventions until short term weight is achieved;Long Term: Adherence to nutrition and physical activity/exercise program aimed toward attainment of established weight goal;Weight Loss: Understanding of general recommendations for a balanced deficit meal plan, which promotes 1-2 lb weight loss per week and includes a negative energy balance of [phone] kcal/d;Understanding recommendations for meals to include 15-35% energy as protein, 25-35% energy from fat, 35-60% energy from carbohydrates, less than 200mg  of dietary cholesterol, 20-35 gm of total fiber daily;Understanding of distribution of calorie intake throughout the day with the consumption of 4-5 meals/snacks    Diabetes Yes    Intervention Provide education about signs/symptoms and action to take for hypo/hyperglycemia.;Provide education about proper nutrition, including hydration, and aerobic/resistive exercise prescription along with prescribed medications to achieve blood glucose in normal ranges: Fasting glucose 65-99 mg/dL    Expected Outcomes Short Term: Participant verbalizes understanding of the  signs/symptoms and immediate care of hyper/hypoglycemia, proper foot care and importance of medication, aerobic/resistive exercise and nutrition plan for blood glucose control.;Long Term: Attainment of HbA1C < 7%.    Hypertension Yes    Intervention Provide education on lifestyle modifcations including regular physical activity/exercise, weight management, moderate sodium restriction and increased consumption of fresh fruit, vegetables, and low fat dairy, alcohol moderation, and smoking cessation.;Monitor prescription use compliance.    Expected Outcomes Short Term: Continued assessment and intervention until BP is < 140/30mm HG in hypertensive participants. < 130/60mm HG in hypertensive participants with diabetes, heart failure or chronic kidney disease.;Long Term: Maintenance of blood pressure at goal levels.    Lipids Yes    Intervention Provide education and support for participant on nutrition & aerobic/resistive exercise along with prescribed medications to achieve LDL 70mg , HDL >40mg .    Expected Outcomes Short Term: Participant states understanding of desired cholesterol values and is compliant with medications prescribed. Participant is following exercise prescription and nutrition guidelines.;Long Term: Cholesterol controlled with medications as prescribed, with individualized exercise RX and with personalized nutrition plan. Value goals: LDL < 70mg , HDL > 40 mg.          Education:Diabetes - Individual verbal and written instruction to review signs/symptoms of diabetes, desired ranges of glucose level fasting, after meals and with exercise. Acknowledge that pre and post exercise glucose checks will  be done for 3 sessions at entry of program.   Core Components/Risk Factors/Patient Goals Review:   Goals and Risk Factor Review     Row Name 03/16/24 0940             Core Components/Risk Factors/Patient Goals Review   Personal Goals Review Hypertension;Diabetes       Review Dick  reports he doesnt check his BP at home, but has started checking his BS more. Admits it is something he does not like doing and still working on making it a consisitent habit.       Expected Outcomes STG: Check BS more consistently, take meds as prescribed. LTG: Manage risk factors independently          Core Components/Risk Factors/Patient Goals at Discharge (Final Review):   Goals and Risk Factor Review - 03/16/24 0940       Core Components/Risk Factors/Patient Goals Review   Personal Goals Review Hypertension;Diabetes    Review Dick reports he doesnt check his BP at home, but has started checking his BS more. Admits it is something he does not like doing and still working on making it a consisitent habit.    Expected Outcomes STG: Check BS more consistently, take meds as prescribed. LTG: Manage risk factors independently          ITP Comments:  ITP Comments     Row Name 02/14/24 1051 02/19/24 1135 02/21/24 0928 03/18/24 1455     ITP Comments Initial phone call completed. Diagnosis can be found in [facility] 7/11. EP Orientation scheduled for Wednesday 8/27 at 9:30. Completed and gym orientation for cardiac rehab. Initial ITP created and sent for review to Dr. Oneil Pinal, Medical Director. First full day of exercise!  Patient was oriented to gym and equipment including functions, settings, policies, and procedures.  Patient's individual exercise prescription and treatment plan were reviewed.  All starting workloads were established based on the results of the 6 minute walk test done at initial orientation visit.  The plan for exercise progression was also introduced and progression will be customized based on patient's performance and goals. 30 Day review completed. Medical Director ITP review done; changes made as directed and signed approval by Medical Director. New to program.       Comments: 30 day review

## 2024-03-18 NOTE — Progress Notes (Signed)
 Daily Session Note  Patient Details  Name: Daniel Holden MRN: [id number] Date of Birth: Feb 10, 1946 Referring Provider:   Flowsheet Row Cardiac Rehab from 02/19/2024 in [facility] Cardiac and Pulmonary Rehab  Referring Provider Dr. Wilbert Bihari, MD    Encounter Date: 03/18/2024  Check In:  Session Check In - 03/18/24 0928       Check-In   Supervising physician immediately available to respond to emergencies See telemetry face sheet for immediately available ER MD    Location [facility]-Cardiac & Pulmonary Rehab    Staff Present Burnard Davenport [facility], RN, DNP, NE-BC;Maxon Conetta BS, Exercise Physiologist;[employer], MS, Exercise Physiologist;Jason Elnor RDN,LDN    Virtual Visit No    Medication changes reported     No    Fall or balance concerns reported    No    Tobacco Cessation No Change    Warm-up and Cool-down Performed on first and last piece of equipment    Resistance Training Performed Yes    VAD Patient? No    PAD/SET Patient? No      Pain Assessment   Currently in Pain? No/denies             Social History   Tobacco Use  Smoking Status Never  Smokeless Tobacco Never    Goals Met:  Independence with exercise equipment Exercise tolerated well No report of concerns or symptoms today Strength training completed today  Goals Unmet:  Not Applicable  Comments: Pt able to follow exercise prescription today without complaint.  Will continue to monitor for progression.    Dr. Oneil Pinal is Medical Director for [facility] Cardiac Rehabilitation.  Dr. Fuad Aleskerov is Medical Director for [facility] Pulmonary Rehabilitation.

## 2024-03-19 DIAGNOSIS — Z23 Encounter for immunization: Secondary | ICD-10-CM | POA: Diagnosis not present

## 2024-03-20 ENCOUNTER — Encounter

## 2024-03-20 DIAGNOSIS — E119 Type 2 diabetes mellitus without complications: Secondary | ICD-10-CM | POA: Diagnosis not present

## 2024-03-20 DIAGNOSIS — I213 ST elevation (STEMI) myocardial infarction of unspecified site: Secondary | ICD-10-CM

## 2024-03-20 DIAGNOSIS — Z951 Presence of aortocoronary bypass graft: Secondary | ICD-10-CM

## 2024-03-20 DIAGNOSIS — I252 Old myocardial infarction: Secondary | ICD-10-CM | POA: Diagnosis not present

## 2024-03-20 DIAGNOSIS — Z48812 Encounter for surgical aftercare following surgery on the circulatory system: Secondary | ICD-10-CM | POA: Diagnosis not present

## 2024-03-20 NOTE — Progress Notes (Signed)
 Daily Session Note  Patient Details  Name: SAAFIR ABDULLAH MRN: [id number] Date of Birth: Nov 24, 1945 Referring Provider:   Flowsheet Row Cardiac Rehab from 02/19/2024 in [facility] Cardiac and Pulmonary Rehab  Referring Provider Dr. Wilbert Bihari, MD    Encounter Date: 03/20/2024  Check In:  Session Check In - 03/20/24 0908       Check-In   Supervising physician immediately available to respond to emergencies See telemetry face sheet for immediately available ER MD    Location [facility]-Cardiac & Pulmonary Rehab    Staff Present Burnard Davenport RN,BSN,MPA;Joseph Hood RCP,RRT,BSRT;Maxon Conetta BS, Exercise Physiologist;Noah Tickle, BS, Exercise Physiologist    Virtual Visit No    Medication changes reported     No    Fall or balance concerns reported    No    Tobacco Cessation No Change    Warm-up and Cool-down Performed on first and last piece of equipment    Resistance Training Performed Yes    VAD Patient? No    PAD/SET Patient? No      Pain Assessment   Currently in Pain? No/denies             Social History   Tobacco Use  Smoking Status Never  Smokeless Tobacco Never    Goals Met:  Independence with exercise equipment Exercise tolerated well No report of concerns or symptoms today Strength training completed today  Goals Unmet:  Not Applicable  Comments: Pt able to follow exercise prescription today without complaint.  Will continue to monitor for progression.    Dr. Oneil Pinal is Medical Director for [facility] Cardiac Rehabilitation.  Dr. Fuad Aleskerov is Medical Director for [facility] Pulmonary Rehabilitation.

## 2024-03-21 ENCOUNTER — Encounter: Payer: Self-pay | Admitting: Cardiology

## 2024-03-21 DIAGNOSIS — M79652 Pain in left thigh: Secondary | ICD-10-CM | POA: Diagnosis not present

## 2024-03-22 ENCOUNTER — Encounter: Payer: Self-pay | Admitting: Cardiology

## 2024-03-23 ENCOUNTER — Telehealth: Payer: Self-pay

## 2024-03-23 ENCOUNTER — Other Ambulatory Visit: Payer: Self-pay

## 2024-03-23 ENCOUNTER — Encounter

## 2024-03-23 DIAGNOSIS — I2510 Atherosclerotic heart disease of native coronary artery without angina pectoris: Secondary | ICD-10-CM

## 2024-03-23 DIAGNOSIS — Z79899 Other long term (current) drug therapy: Secondary | ICD-10-CM

## 2024-03-23 DIAGNOSIS — E7800 Pure hypercholesterolemia, unspecified: Secondary | ICD-10-CM

## 2024-03-23 MED ORDER — ATORVASTATIN CALCIUM 40 MG PO TABS: 40.0000 mg | ORAL_TABLET | Freq: Every day | ORAL | 3 refills | Status: DC

## 2024-03-23 NOTE — Telephone Encounter (Signed)
 MC to patient to advise Dr. Shlomo has decreased atorvastatin  to 40 mg daily and patient can repeat labs in 8 weeks.

## 2024-03-23 NOTE — Telephone Encounter (Signed)
 Mychart message sent to pt by Miriam Shams NP.

## 2024-03-25 ENCOUNTER — Encounter

## 2024-03-27 ENCOUNTER — Encounter

## 2024-03-30 ENCOUNTER — Encounter

## 2024-04-01 ENCOUNTER — Ambulatory Visit

## 2024-04-03 ENCOUNTER — Encounter

## 2024-04-06 ENCOUNTER — Encounter

## 2024-04-08 ENCOUNTER — Encounter

## 2024-04-10 ENCOUNTER — Ambulatory Visit

## 2024-04-11 DIAGNOSIS — I480 Paroxysmal atrial fibrillation: Secondary | ICD-10-CM | POA: Diagnosis not present

## 2024-04-13 ENCOUNTER — Encounter: Attending: Cardiology

## 2024-04-13 DIAGNOSIS — Z951 Presence of aortocoronary bypass graft: Secondary | ICD-10-CM | POA: Diagnosis not present

## 2024-04-13 DIAGNOSIS — I213 ST elevation (STEMI) myocardial infarction of unspecified site: Secondary | ICD-10-CM | POA: Insufficient documentation

## 2024-04-13 NOTE — Progress Notes (Signed)
 Daily Session Note  Patient Details  Name: Daniel Holden MRN: [id number] Date of Birth: 05/07/1946 Referring Provider:   Flowsheet Row Cardiac Rehab from 02/19/2024 in [facility] Cardiac and Pulmonary Rehab  Referring Provider Dr. Wilbert Bihari, MD    Encounter Date: 04/13/2024  Check In:  Session Check In - 04/13/24 0909       Check-In   Supervising physician immediately available to respond to emergencies See telemetry face sheet for immediately available ER MD    Location [facility]-Cardiac & Pulmonary Rehab    Staff Present Burnard Davenport RN,BSN,MPA;Joseph Hood RCP,RRT,BSRT;Maxon Burnell BS, Exercise Physiologist;Julanne Schlueter Dyane HECKLE, ACSM CEP, Exercise Physiologist;Jason Elnor RDN,LDN    Virtual Visit No    Medication changes reported     No    Fall or balance concerns reported    No    Tobacco Cessation No Change    Warm-up and Cool-down Performed on first and last piece of equipment    Resistance Training Performed Yes    VAD Patient? No    PAD/SET Patient? No      Pain Assessment   Currently in Pain? No/denies             Social History   Tobacco Use  Smoking Status Never  Smokeless Tobacco Never    Goals Met:  Independence with exercise equipment Exercise tolerated well No report of concerns or symptoms today Strength training completed today  Goals Unmet:  Not Applicable  Comments: Pt able to follow exercise prescription today without complaint.  Will continue to monitor for progression.    Dr. Oneil Pinal is Medical Director for [facility] Cardiac Rehabilitation.  Dr. Fuad Aleskerov is Medical Director for [facility] Pulmonary Rehabilitation.

## 2024-04-15 ENCOUNTER — Encounter: Admitting: Emergency Medicine

## 2024-04-15 DIAGNOSIS — Z951 Presence of aortocoronary bypass graft: Secondary | ICD-10-CM

## 2024-04-15 DIAGNOSIS — I213 ST elevation (STEMI) myocardial infarction of unspecified site: Secondary | ICD-10-CM

## 2024-04-15 NOTE — Progress Notes (Signed)
 Cardiac Individual Treatment Plan  Patient Details  Name: Daniel Holden MRN: [id number] Date of Birth: 1946/01/19 Referring Provider:   Flowsheet Row Cardiac Rehab from 02/19/2024 in [facility] Cardiac and Pulmonary Rehab  Referring Provider Dr. Wilbert Bihari, MD    Initial Encounter Date:  Flowsheet Row Cardiac Rehab from 02/19/2024 in [facility] Cardiac and Pulmonary Rehab  Date 02/19/24    Visit Diagnosis: S/P CABG x 3  ST elevation myocardial infarction (STEMI), unspecified artery (HCC)  Patient's Home Medications on Admission:  Current Outpatient Medications:    ALPRAZolam  (XANAX ) 0.5 MG tablet, Take 0.25 mg by mouth at bedtime as needed for sleep., Disp: , Rfl:    amLODipine  (NORVASC ) 5 MG tablet, TAKE 1 TABLET EVERY DAY, Disp: 90 tablet, Rfl: 2   aspirin  81 MG EC tablet, Take 81 mg by mouth daily., Disp: , Rfl:    atorvastatin  (LIPITOR ) 40 MG tablet, Take 1 tablet (40 mg total) by mouth daily., Disp: 90 tablet, Rfl: 3   Cholecalciferol  250 MCG (10000 UT) TABS, Take 1,000 Units by mouth daily., Disp: , Rfl:    clopidogrel  (PLAVIX ) 75 MG tablet, Take 1 tablet (75 mg total) by mouth daily., Disp: 90 tablet, Rfl: 2   cyanocobalamin  1000 MCG tablet, Take 1,000 mcg by mouth daily., Disp: , Rfl:    Fe Fum-Vit C-Vit B12-FA (TRIGELS-F FORTE) CAPS capsule, Take 1 capsule by mouth daily after breakfast., Disp: 30 capsule, Rfl: 2   fluticasone  (FLONASE ) 50 MCG/ACT nasal spray, Place 2 sprays into both nostrils daily. , Disp: , Rfl:    losartan  (COZAAR ) 25 MG tablet, Take 1 tablet (25 mg total) by mouth daily., Disp: 90 tablet, Rfl: 3   melatonin 3 MG TABS tablet, Take 10 mg by mouth at bedtime., Disp: , Rfl:    metFORMIN  (GLUCOPHAGE ) 500 MG tablet, Take 500 mg by mouth 2 (two) times daily., Disp: , Rfl:    metoprolol  tartrate (LOPRESSOR ) 25 MG tablet, Take 1 tablet (25 mg total) by mouth 2 (two) times daily. (Patient taking differently: Take 25 mg by mouth daily.), Disp: 180 tablet, Rfl: 3    pantoprazole  (PROTONIX ) 40 MG tablet, Take 40 mg by mouth 2 (two) times daily before a meal., Disp: , Rfl:   Past Medical History: Past Medical History:  Diagnosis Date   Allergy    Angina of effort    Anxiety    CAD (coronary artery disease), native coronary artery    s/p cath 12/2023 CTO of RCA, 95% LAD, 90% LCx and echo was normal.  He underwent CABG with LIMA-LAD, SVG-PL and a left radial-OM .   [facility])    Carpal tunnel syndrome    GERD (gastroesophageal reflux disease)    HLD (hyperlipidemia)    Hypertension    Lung nodule    Obesity    PAF (paroxysmal atrial fibrillation) (HCC) 12/2023   s/p MAZE and LA clippling   Pancreatitis 2015   Sleep apnea     Tobacco Use: Social History   Tobacco Use  Smoking Status Never  Smokeless Tobacco Never    Labs: Review Flowsheet  More data exists      Latest Ref Rng & Units 11/07/2020 03/19/2021 01/02/2024 01/05/2024 01/06/2024  Labs for ITP Cardiac and Pulmonary Rehab  Cholestrol 0 - 200 mg/dL 895  - 880  - -  LDL (calc) 0 - 99 mg/dL 26  - 33  - -  HDL-C >59 mg/dL 58  - 60  - -  Trlycerides <150 mg/dL  111  - 132  - -  Hemoglobin A1c 4.8 - 5.6 % - 6.6  6.1  - -  PH, Arterial 7.35 - 7.45 - - - 7.44  7.410  7.362  7.347  7.372  7.425  7.347  7.319   PCO2 arterial 32 - 48 mmHg - - - 31  32.1  33.2  38.9  37.6  35.7  34.9  41.1   Bicarbonate 20.0 - 28.0 mmol/L - - - 21.1  20.3  18.9  21.7  21.8  23.4  19.1  21.1   TCO2 22 - 32 mmol/L - - - - 21  20  23  22  23  25  24  24  20  22  21  22    Acid-base deficit 0.0 - 2.0 mmol/L - - - 2.1  4.0  6.0  4.0  3.0  1.0  6.0  5.0   O2 Saturation % - - - 99.4  98  99  97  99  100  100  100     Details       Multiple values from one day are sorted in reverse-chronological order          Exercise Target Goals: Exercise Program Goal: Individual exercise prescription set using results from initial 6 min walk test and THRR while considering  patient's activity barriers and safety.    Exercise Prescription Goal: Initial exercise prescription builds to 30-45 minutes a day of aerobic activity, 2-3 days per week.  Home exercise guidelines will be given to patient during program as part of exercise prescription that the participant will acknowledge.   Education: Aerobic Exercise: - Group verbal and visual presentation on the components of exercise prescription. Introduces F.I.T.T principle from ACSM for exercise prescriptions.  Reviews F.I.T.T. principles of aerobic exercise including progression. Written material provided at class time. Flowsheet Row Cardiac Rehab from 05/24/2021 in [facility] Cardiac and Pulmonary Rehab  Education need identified 03/09/21    Education: Resistance Exercise: - Group verbal and visual presentation on the components of exercise prescription. Introduces F.I.T.T principle from ACSM for exercise prescriptions  Reviews F.I.T.T. principles of resistance exercise including progression. Written material provided at class time. Flowsheet Row Cardiac Rehab from 05/24/2021 in [facility] Cardiac and Pulmonary Rehab  Date 04/26/21  Educator [facility]  Instruction Review Code 1- Verbalizes Understanding     Education: Exercise & Equipment Safety: - Individual verbal instruction and demonstration of equipment use and safety with use of the equipment. Flowsheet Row Cardiac Rehab from 03/11/2024 in [facility] Cardiac and Pulmonary Rehab  Date 02/19/24  Educator NT  Instruction Review Code 1- Verbalizes Understanding    Education: Exercise Physiology & General Exercise Guidelines: - Group verbal and written instruction with models to review the exercise physiology of the cardiovascular system and associated critical values. Provides general exercise guidelines with specific guidelines to those with heart or lung disease. Written material provided at class time. Flowsheet Row Cardiac Rehab from 05/24/2021 in [facility] Cardiac and Pulmonary Rehab  Date 04/12/21  Educator [facility]   Instruction Review Code 1- Verbalizes Understanding    Education: Flexibility, Balance, Mind/Body Relaxation: - Group verbal and visual presentation with interactive activity on the components of exercise prescription. Introduces F.I.T.T principle from ACSM for exercise prescriptions. Reviews F.I.T.T. principles of flexibility and balance exercise training including progression. Also discusses the mind body connection.  Reviews various relaxation techniques to help reduce and manage stress (i.e. Deep breathing, progressive muscle relaxation, and visualization). Balance handout provided  to take home. Written material provided at class time. Flowsheet Row Cardiac Rehab from 05/24/2021 in [facility] Cardiac and Pulmonary Rehab  Date 05/03/21  Educator AS  Instruction Review Code 1- Verbalizes Understanding    Activity Barriers & Risk Stratification:  Activity Barriers & Cardiac Risk Stratification - 02/14/24 1033       Activity Barriers & Cardiac Risk Stratification   Activity Barriers Left Knee Replacement;Joint Problems;Back Problems    Cardiac Risk Stratification High          6 Minute Walk:  6 Minute Walk     Row Name 02/19/24 1143         6 Minute Walk   Phase Initial     Distance 1195 feet     Walk Time 6 minutes     # of Rest Breaks 0     MPH 2.26     METS 2.26     RPE 10     Perceived Dyspnea  1     VO2 Peak 7.92     Symptoms No     Resting HR 76 bpm     Resting BP 142/68     Resting Oxygen Saturation  96 %     Exercise Oxygen Saturation  during 6 min walk 96 %     Max Ex. HR 96 bpm     Max Ex. BP 160/72     2 Minute Post BP 154/72        Oxygen Initial Assessment:   Oxygen Re-Evaluation:   Oxygen Discharge (Final Oxygen Re-Evaluation):   Initial Exercise Prescription:  Initial Exercise Prescription - 02/19/24 1100       Date of Initial Exercise RX and Referring Provider   Date 02/19/24    Referring Provider Dr. Wilbert Bihari, MD      Oxygen    Maintain Oxygen Saturation 88% or higher      Treadmill   MPH 2.2    Grade 0    Minutes 15    METs 2.68      REL-XR   Level 3    Speed 50    Minutes 15    METs 2.26      Biostep-RELP   Level 2    SPM 50    Minutes 15    METs 2.26      Track   Laps 26    Minutes 15    METs 2.41      Prescription Details   Frequency (times per week) 3    Duration Progress to 30 minutes of continuous aerobic without signs/symptoms of physical distress      Intensity   THRR 40-80% of Max Heartrate 103-131    Ratings of Perceived Exertion 11-13    Perceived Dyspnea 0-4      Progression   Progression Continue to progress workloads to maintain intensity without signs/symptoms of physical distress.      Resistance Training   Training Prescription Yes    Weight 5 lb    Reps 10-15          Perform Capillary Blood Glucose checks as needed.  Exercise Prescription Changes:   Exercise Prescription Changes     Row Name 02/19/24 1100 03/03/24 0800 03/19/24 1600 03/31/24 1400 04/15/24 0900     Response to Exercise   Blood Pressure (Admit) 142/68 114/60 124/80 106/60 --   Blood Pressure (Exercise) 160/72 158/70 162/70 152/60 --   Blood Pressure (Exit) 154/72 118/62 124/70 122/62 --   Heart  Rate (Admit) 76 bpm 81 bpm 77 bpm 78 bpm --   Heart Rate (Exercise) 96 bpm 107 bpm 104 bpm 108 bpm --   Heart Rate (Exit) 89 bpm 91 bpm 90 bpm 87 bpm --   Oxygen Saturation (Admit) 96 % -- -- -- --   Oxygen Saturation (Exercise) 96 % -- -- -- --   Rating of Perceived Exertion (Exercise) 10 13 14 13  --   Perceived Dyspnea (Exercise) 1 -- -- -- --   Symptoms none none none none --   Comments Results 1st 2 weeks of exercise sessions -- -- --   Duration -- Progress to 30 minutes of  aerobic without signs/symptoms of physical distress Progress to 30 minutes of  aerobic without signs/symptoms of physical distress Continue with 30 min of aerobic exercise without signs/symptoms of physical distress.  Continue with 30 min of aerobic exercise without signs/symptoms of physical distress.   Intensity -- THRR unchanged THRR unchanged THRR unchanged THRR unchanged     Progression   Progression -- Continue to progress workloads to maintain intensity without signs/symptoms of physical distress. Continue to progress workloads to maintain intensity without signs/symptoms of physical distress. Continue to progress workloads to maintain intensity without signs/symptoms of physical distress. Continue to progress workloads to maintain intensity without signs/symptoms of physical distress.   Average METs -- 2.86 3.57 3.29 3.29     Resistance Training   Training Prescription -- Yes Yes Yes Yes   Weight -- 5 lb 5 lb 5 lb 5 lb   Reps -- 10-15 10-15 10-15 10-15     Interval Training   Interval Training -- No No No No     Treadmill   MPH -- 2.4 2.5 2.5 2.5   Grade -- 0 0 0 0   Minutes -- 15 15 15 15    METs -- 2.84 2.91 2.91 2.91     REL-XR   Level -- 4 7 4 4    Minutes -- 15 15 15 15    METs -- -- 5.4 5 5      Biostep-RELP   Level -- 3 3 3 3    Minutes -- 15 15 15 15    METs -- 3 3 3 3      Home Exercise Plan   Plans to continue exercise at -- -- -- -- Home (comment)  RB at home and possibliy join [employer]   Frequency -- -- -- -- Add 2 additional days to program exercise sessions.   Initial Home Exercises Provided -- -- -- -- 04/15/24     Oxygen   Maintain Oxygen Saturation -- 88% or higher 88% or higher 88% or higher 88% or higher      Exercise Comments:   Exercise Comments     Row Name 02/21/24 9071           Exercise Comments First full day of exercise!  Patient was oriented to gym and equipment including functions, settings, policies, and procedures.  Patient's individual exercise prescription and treatment plan were reviewed.  All starting workloads were established based on the results of the 6 minute walk test done at initial orientation visit.  The plan for exercise  progression was also introduced and progression will be customized based on patient's performance and goals.          Exercise Goals and Review:   Exercise Goals     Row Name 02/19/24 1157             Exercise Goals  Increase Physical Activity Yes       Intervention Provide advice, education, support and counseling about physical activity/exercise needs.;Develop an individualized exercise prescription for aerobic and resistive training based on initial evaluation findings, risk stratification, comorbidities and participant's personal goals.       Expected Outcomes Short Term: Attend rehab on a regular basis to increase amount of physical activity.;Long Term: Add in home exercise to make exercise part of routine and to increase amount of physical activity.;Long Term: Exercising regularly at least 3-5 days a week.       Increase Strength and Stamina Yes       Intervention Provide advice, education, support and counseling about physical activity/exercise needs.;Develop an individualized exercise prescription for aerobic and resistive training based on initial evaluation findings, risk stratification, comorbidities and participant's personal goals.       Expected Outcomes Short Term: Increase workloads from initial exercise prescription for resistance, speed, and METs.;Short Term: Perform resistance training exercises routinely during rehab and add in resistance training at home;Long Term: Improve cardiorespiratory fitness, muscular endurance and strength as measured by increased METs and functional capacity ( )       Able to understand and use rate of perceived exertion (RPE) scale Yes       Intervention Provide education and explanation on how to use RPE scale       Expected Outcomes Short Term: Able to use RPE daily in rehab to express subjective intensity level;Long Term:  Able to use RPE to guide intensity level when exercising independently       Able to understand and use Dyspnea scale  Yes       Intervention Provide education and explanation on how to use Dyspnea scale       Expected Outcomes Short Term: Able to use Dyspnea scale daily in rehab to express subjective sense of shortness of breath during exertion;Long Term: Able to use Dyspnea scale to guide intensity level when exercising independently       Knowledge and understanding of Target Heart Rate Range (THRR) Yes       Intervention Provide education and explanation of THRR including how the numbers were predicted and where they are located for reference       Expected Outcomes Short Term: Able to state/look up THRR;Long Term: Able to use THRR to govern intensity when exercising independently;Short Term: Able to use daily as guideline for intensity in rehab       Able to check pulse independently Yes       Intervention Provide education and demonstration on how to check pulse in carotid and radial arteries.;Review the importance of being able to check your own pulse for safety during independent exercise       Expected Outcomes Short Term: Able to explain why pulse checking is important during independent exercise;Long Term: Able to check pulse independently and accurately       Understanding of Exercise Prescription Yes       Intervention Provide education, explanation, and written materials on patient's individual exercise prescription       Expected Outcomes Short Term: Able to explain program exercise prescription;Long Term: Able to explain home exercise prescription to exercise independently          Exercise Goals Re-Evaluation :  Exercise Goals Re-Evaluation     Row Name 02/21/24 9071 03/03/24 0815 03/16/24 0936 03/19/24 1643 03/31/24 1443     Exercise Goal Re-Evaluation   Exercise Goals Review Increase Physical Activity;Able to understand and use rate  of perceived exertion (RPE) scale;Knowledge and understanding of Target Heart Rate Range (THRR);Understanding of Exercise Prescription;Increase Strength and  Stamina;Able to understand and use Dyspnea scale;Able to check pulse independently Increase Physical Activity;Understanding of Exercise Prescription;Increase Strength and Stamina Increase Physical Activity;Increase Strength and Stamina;Understanding of Exercise Prescription Increase Physical Activity;Increase Strength and Stamina;Understanding of Exercise Prescription Increase Physical Activity;Increase Strength and Stamina;Understanding of Exercise Prescription   Comments Reviewed RPE and dyspnea scale, THR and program prescription with pt today.  Pt voiced understanding and was given a copy of goals to take home. Dick is off to a good start in the program and completed his first 2 weeks in this review. He worked at level 4 on the XR and level 3 on the biostep. He had a workload on the treadmill of a speed of 2.4 mph with no incline. We will continue to monitor his progress in the program. Dick is doing well in rehab. He is walking some at home on days he is not attending rehab. His wife has black card for [employer] and sometimes he will go with her to work on his core. Dick continues to do well in rehab. He was recently able to increase his level on the XR from 4 to 7. He was also able to increase his speed on the treadmill from 2.4mph to 2.5mph. We will continue to monitor his progress in the program. Dick continues to do well in rehab. He continues to walk on the treadmill at a speed of 2.5 mph with no incline. He also has consistently worked at level 2 on the biostep. We will continue to monitor his progress in the program.   Expected Outcomes Short: Use RPE daily to regulate intensity. Long: Follow program prescription in THR. Short: Continue to follow current exercise prescription. Long: Continue exercise to improve strength and stamina. STG: Continue to attend rehab and incorperate more home exercise as able. LTG: Continue exercise to improve strength and stamina. Short: Continue to increase treadmill  workloads. Long: Continue exercise to improve strength and stamina. Short: Continue to progressively increase workloads. Long: Continue exercise to improve strength and stamina.    Row Name 04/15/24 0955             Exercise Goal Re-Evaluation   Exercise Goals Review Increase Physical Activity;Able to understand and use rate of perceived exertion (RPE) scale;Knowledge and understanding of Target Heart Rate Range (THRR);Understanding of Exercise Prescription;Increase Strength and Stamina;Able to understand and use Dyspnea scale;Able to check pulse independently       Comments Reviewed home exercise with pt today.  Pt plans to use his RB at home and possibly join [employer] for exercise.  Reviewed THR, pulse, RPE, sign and symptoms, pulse oximetery and when to call 911 or MD.  Also discussed weather considerations and indoor options.  Pt voiced understanding.       Expected Outcomes Short: add 1-2 days a week or exercise on off days of rehab. Long: maintain independent exercise routine upon graduation from cardiac rehab.          Discharge Exercise Prescription (Final Exercise Prescription Changes):  Exercise Prescription Changes - 04/15/24 0900       Response to Exercise   Duration Continue with 30 min of aerobic exercise without signs/symptoms of physical distress.    Intensity THRR unchanged      Progression   Progression Continue to progress workloads to maintain intensity without signs/symptoms of physical distress.    Average METs 3.29  Resistance Training   Training Prescription Yes    Weight 5 lb    Reps 10-15      Interval Training   Interval Training No      Treadmill   MPH 2.5    Grade 0    Minutes 15    METs 2.91      REL-XR   Level 4    Minutes 15    METs 5      Biostep-RELP   Level 3    Minutes 15    METs 3      Home Exercise Plan   Plans to continue exercise at Home (comment)   RB at home and possibliy join [employer]   Frequency Add 2  additional days to program exercise sessions.    Initial Home Exercises Provided 04/15/24      Oxygen   Maintain Oxygen Saturation 88% or higher          Nutrition:  Target Goals: Understanding of nutrition guidelines, daily intake of sodium 1500mg , cholesterol 200mg , calories 30% from fat and 7% or less from saturated fats, daily to have 5 or more servings of fruits and vegetables.  Education: Nutrition 1 -Group instruction provided by verbal, written material, interactive activities, discussions, models, and posters to present general guidelines for heart healthy nutrition including macronutrients, label reading, and promoting whole foods over processed counterparts. Education serves as Pensions consultant of discussion of heart healthy eating for all. Written material provided at class time.    Education: Nutrition 2 -Group instruction provided by verbal, written material, interactive activities, discussions, models, and posters to present general guidelines for heart healthy nutrition including sodium, cholesterol, and saturated fat. Providing guidance of habit forming to improve blood pressure, cholesterol, and body weight. Written material provided at class time.     Biometrics:  Pre Biometrics - 02/19/24 1157       Pre Biometrics   Height 5' 6.5 (1.689 m)    Weight 210 lb 3.2 oz (95.3 kg)    Waist Circumference 42.5 inches    Hip Circumference 42 inches    Waist to Hip Ratio 1.01 %    BMI (Calculated) 33.42    Single Leg Stand 30 seconds           Nutrition Therapy Plan and Nutrition Goals:  Nutrition Therapy & Goals - 02/19/24 1141       Nutrition Therapy   RD appointment deferred Yes      Intervention Plan   Intervention Prescribe, educate and counsel regarding individualized specific dietary modifications aiming towards targeted core components such as weight, hypertension, lipid management, diabetes, heart failure and other comorbidities.    Expected Outcomes  Short Term Goal: Understand basic principles of dietary content, such as calories, fat, sodium, cholesterol and nutrients.;Short Term Goal: A plan has been developed with personal nutrition goals set during dietitian appointment.;Long Term Goal: Adherence to prescribed nutrition plan.          Nutrition Assessments:  MEDIFICTS Score Key: >=70 Need to make dietary changes  40-70 Heart Healthy Diet <= 40 Therapeutic Level Cholesterol Diet  Flowsheet Row Cardiac Rehab from 02/19/2024 in [facility] Cardiac and Pulmonary Rehab  Picture Your Plate Total Score on Admission 66   Picture Your Plate Scores: <59 Unhealthy dietary pattern with much room for improvement. 41-50 Dietary pattern unlikely to meet recommendations for good health and room for improvement. 51-60 More healthful dietary pattern, with some room for improvement.  >60 Healthy dietary pattern, although there  may be some specific behaviors that could be improved.    Nutrition Goals Re-Evaluation:  Nutrition Goals Re-Evaluation     Row Name 03/16/24 0939 04/13/24 0931           Goals   Comment Dick deferred RD appointment Dick deferred RD appointment         Nutrition Goals Discharge (Final Nutrition Goals Re-Evaluation):  Nutrition Goals Re-Evaluation - 04/13/24 0931       Goals   Comment Dick deferred RD appointment          Psychosocial: Target Goals: Acknowledge presence or absence of significant depression and/or stress, maximize coping skills, provide positive support system. Participant is able to verbalize types and ability to use techniques and skills needed for reducing stress and depression.   Education: Stress, Anxiety, and Depression - Group verbal and visual presentation to define topics covered.  Reviews how body is impacted by stress, anxiety, and depression.  Also discusses healthy ways to reduce stress and to treat/manage anxiety and depression. Written material provided at class time. Flowsheet  Row Cardiac Rehab from 03/11/2024 in [facility] Cardiac and Pulmonary Rehab  Education need identified 02/19/24    Education: Sleep Hygiene -Provides group verbal and written instruction about how sleep can affect your health.  Define sleep hygiene, discuss sleep cycles and impact of sleep habits. Review good sleep hygiene tips.   Initial Review & Psychosocial Screening:  Initial Psych Review & Screening - 02/14/24 1042       Initial Review   Current issues with Current Stress Concerns      Family Dynamics   Good Support System? Yes      Barriers   Psychosocial barriers to participate in program There are no identifiable barriers or psychosocial needs.      Screening Interventions   Interventions Encouraged to exercise;Provide feedback about the scores to participant;To provide support and resources with identified psychosocial needs    Expected Outcomes Short Term goal: Utilizing psychosocial counselor, staff and physician to assist with identification of specific Stressors or current issues interfering with healing process. Setting desired goal for each stressor or current issue identified.;Long Term Goal: Stressors or current issues are controlled or eliminated.;Short Term goal: Identification and review with participant of any Quality of Life or Depression concerns found by scoring the questionnaire.;Long Term goal: The participant improves quality of Life and PHQ9 Scores as seen by post scores and/or verbalization of changes          Quality of Life Scores:   Quality of Life - 02/19/24 1142       Quality of Life   Select Quality of Life      Quality of Life Scores   Health/Function Pre 23.8 %    Socioeconomic Pre 25.63 %    Psych/Spiritual Pre 26.64 %    Family Pre 30 %    GLOBAL Pre 25.67 %         Scores of 19 and below usually indicate a poorer quality of life in these areas.  A difference of  2-3 points is a clinically meaningful difference.  A difference of 2-3 points  in the total score of the Quality of Life Index has been associated with significant improvement in overall quality of life, self-image, physical symptoms, and general health in studies assessing change in quality of life.  PHQ-9: Review Flowsheet  More data may exist      02/19/2024 05/26/2021 03/09/2021 01/23/2021 07/14/2020  Depression screen PHQ 2/9  Decreased  Interest 0 0 0 0 0  Down, Depressed, Hopeless 0 0 0 0 0  PHQ - 2 Score 0 0 0 0 0  Altered sleeping 0 0 0 - -  Tired, decreased energy 2 1 1  - -  Change in appetite 0 1 1 - -  Feeling bad or failure about yourself  0 0 0 - -  Trouble concentrating 0 0 0 - -  Moving slowly or fidgety/restless 1 0 0 - -  Suicidal thoughts 0 0 0 - -  PHQ-9 Score 3 2 2  - -  Difficult doing work/chores Not difficult at all Not difficult at all Not difficult at all - -   Interpretation of Total Score  Total Score Depression Severity:  1-4 = Minimal depression, 5-9 = Mild depression, 10-14 = Moderate depression, 15-19 = Moderately severe depression, 20-27 = Severe depression   Psychosocial Evaluation and Intervention:  Psychosocial Evaluation - 02/14/24 1047       Psychosocial Evaluation & Interventions   Interventions Encouraged to exercise with the program and follow exercise prescription;Relaxation education    Comments 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 He states he has been walking consistently, but mentions some stress because he feels like he should be further along in his progress. He stays active and is ready to get involved in the program to increase his stamina. He started sleeping in his bed this past week and has been getting a consistent 8 hours which he is pleased with.    Expected Outcomes Short: attend cardiac rehab for education and exercise Long: develop and maintain positive self care habits    Continue Psychosocial Services  Follow up required by staff          Psychosocial Re-Evaluation:  Psychosocial  Re-Evaluation     Row Name 03/16/24 0938 04/13/24 0932           Psychosocial Re-Evaluation   Current issues with None Identified None Identified      Comments Dick denies any stress, anxiety, or depression at this time. He reports he sleeps well and wakes well rested. Patient reports no issues with their current mental states, sleep, stress, depression or anxiety. Will follow up with patient in a few weeks for any changes.      Expected Outcomes STG: Continue to attend rehab and focus on good sleep. LTG: Achieve and maintain postivie outlook on health and daily life Short: Continue to exercise regularly to support mental health and notify staff of any changes. Long: maintain mental health and well being through teaching of rehab or prescribed medications independently.      Interventions Encouraged to attend Cardiac Rehabilitation for the exercise Encouraged to attend Cardiac Rehabilitation for the exercise      Continue Psychosocial Services  Follow up required by staff Follow up required by staff         Psychosocial Discharge (Final Psychosocial Re-Evaluation):  Psychosocial Re-Evaluation - 04/13/24 0932       Psychosocial Re-Evaluation   Current issues with None Identified    Comments Patient reports no issues with their current mental states, sleep, stress, depression or anxiety. Will follow up with patient in a few weeks for any changes.    Expected Outcomes Short: Continue to exercise regularly to support mental health and notify staff of any changes. Long: maintain mental health and well being through teaching of rehab or prescribed medications independently.    Interventions Encouraged to attend Cardiac Rehabilitation for the  exercise    Continue Psychosocial Services  Follow up required by staff          Vocational Rehabilitation: Provide vocational rehab assistance to qualifying candidates.   Vocational Rehab Evaluation & Intervention:  Vocational Rehab - 02/14/24 1042        Initial Vocational Rehab Evaluation & Intervention   Assessment shows need for Vocational Rehabilitation No          Education: Education Goals: Education classes will be provided on a variety of topics geared toward better understanding of heart health and risk factor modification. Participant will state understanding/return demonstration of topics presented as noted by education test scores.  Learning Barriers/Preferences:  Learning Barriers/Preferences - 02/14/24 1040       Learning Barriers/Preferences   Learning Barriers None    Learning Preferences Individual Instruction          General Cardiac Education Topics:  AED/CPR: - Group verbal and written instruction with the use of models to demonstrate the basic use of the AED with the basic ABC's of resuscitation.   Test and Procedures: - Group verbal and visual presentation and models provide information about basic cardiac anatomy and function. Reviews the testing methods done to diagnose heart disease and the outcomes of the test results. Describes the treatment choices: Medical Management, Angioplasty, or Coronary Bypass Surgery for treating various heart conditions including Myocardial Infarction, Angina, Valve Disease, and Cardiac Arrhythmias. Written material provided at class time. Flowsheet Row Cardiac Rehab from 03/11/2024 in [facility] Cardiac and Pulmonary Rehab  Education need identified 02/19/24    Medication Safety: - Group verbal and visual instruction to review commonly prescribed medications for heart and lung disease. Reviews the medication, class of the drug, and side effects. Includes the steps to properly store meds and maintain the prescription regimen. Written material provided at class time. Flowsheet Row Cardiac Rehab from 05/24/2021 in [facility] Cardiac and Pulmonary Rehab  Date 05/17/21  Educator SB  Instruction Review Code 1- Verbalizes Understanding    Intimacy: - Group verbal instruction  through game format to discuss how heart and lung disease can affect sexual intimacy. Written material provided at class time.   Know Your Numbers and Heart Failure: - Group verbal and visual instruction to discuss disease risk factors for cardiac and pulmonary disease and treatment options.  Reviews associated critical values for Overweight/Obesity, Hypertension, Cholesterol, and Diabetes.  Discusses basics of heart failure: signs/symptoms and treatments.  Introduces Heart Failure Zone chart for action plan for heart failure. Written material provided at class time. Flowsheet Row Cardiac Rehab from 05/24/2021 in [facility] Cardiac and Pulmonary Rehab  Date 05/24/21  Educator SB  Instruction Review Code 1- Verbalizes Understanding    Infection Prevention: - Provides verbal and written material to individual with discussion of infection control including proper hand washing and proper equipment cleaning during exercise session. Flowsheet Row Cardiac Rehab from 03/11/2024 in [facility] Cardiac and Pulmonary Rehab  Date 02/19/24  Educator NT  Instruction Review Code 1- Verbalizes Understanding    Falls Prevention: - Provides verbal and written material to individual with discussion of falls prevention and safety. Flowsheet Row Cardiac Rehab from 03/11/2024 in [facility] Cardiac and Pulmonary Rehab  Date 02/19/24  Educator NT  Instruction Review Code 1- Verbalizes Understanding    Other: -Provides group and verbal instruction on various topics (see comments)   Knowledge Questionnaire Score:  Knowledge Questionnaire Score - 02/19/24 1141       Knowledge Questionnaire Score   Pre Score 24/26  Core Components/Risk Factors/Patient Goals at Admission:  Personal Goals and Risk Factors at Admission - 02/14/24 1035       Core Components/Risk Factors/Patient Goals on Admission    Weight Management Yes    Intervention Weight Management: Develop a combined nutrition and exercise program designed  to reach desired caloric intake, while maintaining appropriate intake of nutrient and fiber, sodium and fats, and appropriate energy expenditure required for the weight goal.;Weight Management: Provide education and appropriate resources to help participant work on and attain dietary goals.;Weight Management/Obesity: Establish reasonable short term and long term weight goals.    Goal Weight: Long Term 185 lb (83.9 kg)    Expected Outcomes Short Term: Continue to assess and modify interventions until short term weight is achieved;Long Term: Adherence to nutrition and physical activity/exercise program aimed toward attainment of established weight goal;Weight Loss: Understanding of general recommendations for a balanced deficit meal plan, which promotes 1-2 lb weight loss per week and includes a negative energy balance of [phone] kcal/d;Understanding recommendations for meals to include 15-35% energy as protein, 25-35% energy from fat, 35-60% energy from carbohydrates, less than 200mg  of dietary cholesterol, 20-35 gm of total fiber daily;Understanding of distribution of calorie intake throughout the day with the consumption of 4-5 meals/snacks    Diabetes Yes    Intervention Provide education about signs/symptoms and action to take for hypo/hyperglycemia.;Provide education about proper nutrition, including hydration, and aerobic/resistive exercise prescription along with prescribed medications to achieve blood glucose in normal ranges: Fasting glucose 65-99 mg/dL    Expected Outcomes Short Term: Participant verbalizes understanding of the signs/symptoms and immediate care of hyper/hypoglycemia, proper foot care and importance of medication, aerobic/resistive exercise and nutrition plan for blood glucose control.;Long Term: Attainment of HbA1C < 7%.    Hypertension Yes    Intervention Provide education on lifestyle modifcations including regular physical activity/exercise, weight management, moderate sodium  restriction and increased consumption of fresh fruit, vegetables, and low fat dairy, alcohol moderation, and smoking cessation.;Monitor prescription use compliance.    Expected Outcomes Short Term: Continued assessment and intervention until BP is < 140/77mm HG in hypertensive participants. < 130/98mm HG in hypertensive participants with diabetes, heart failure or chronic kidney disease.;Long Term: Maintenance of blood pressure at goal levels.    Lipids Yes    Intervention Provide education and support for participant on nutrition & aerobic/resistive exercise along with prescribed medications to achieve LDL 70mg , HDL >40mg .    Expected Outcomes Short Term: Participant states understanding of desired cholesterol values and is compliant with medications prescribed. Participant is following exercise prescription and nutrition guidelines.;Long Term: Cholesterol controlled with medications as prescribed, with individualized exercise RX and with personalized nutrition plan. Value goals: LDL < 70mg , HDL > 40 mg.          Education:Diabetes - Individual verbal and written instruction to review signs/symptoms of diabetes, desired ranges of glucose level fasting, after meals and with exercise. Acknowledge that pre and post exercise glucose checks will be done for 3 sessions at entry of program.   Core Components/Risk Factors/Patient Goals Review:   Goals and Risk Factor Review     Row Name 03/16/24 0940 04/13/24 9072           Core Components/Risk Factors/Patient Goals Review   Personal Goals Review Hypertension;Diabetes Weight Management/Obesity;Diabetes      Review Dick reports he doesnt check his BP at home, but has started checking his BS more. Admits it is something he does not like doing and still working on  making it a consisitent habit. Neev is watching his sugar at home and had a bout with prednisone. He is around 150 fasting at the moment. He keeps close watch of his blood sugar. He would  like to reduce his weight and reach a weight goal of 185 pounds.      Expected Outcomes STG: Check BS more consistently, take meds as prescribed. LTG: Manage risk factors independently Short: lose a few pounds in the next few weeks. Long: reach weight goal.         Core Components/Risk Factors/Patient Goals at Discharge (Final Review):   Goals and Risk Factor Review - 04/13/24 0927       Core Components/Risk Factors/Patient Goals Review   Personal Goals Review Weight Management/Obesity;Diabetes    Review Pascual is watching his sugar at home and had a bout with prednisone. He is around 150 fasting at the moment. He keeps close watch of his blood sugar. He would like to reduce his weight and reach a weight goal of 185 pounds.    Expected Outcomes Short: lose a few pounds in the next few weeks. Long: reach weight goal.          ITP Comments:  ITP Comments     Row Name 02/14/24 1051 02/19/24 1135 02/21/24 0928 03/18/24 1455 04/15/24 1014   ITP Comments Initial phone call completed. Diagnosis can be found in [facility] 7/11. EP Orientation scheduled for Wednesday 8/27 at 9:30. Completed and gym orientation for cardiac rehab. Initial ITP created and sent for review to Dr. Oneil Pinal, Medical Director. First full day of exercise!  Patient was oriented to gym and equipment including functions, settings, policies, and procedures.  Patient's individual exercise prescription and treatment plan were reviewed.  All starting workloads were established based on the results of the 6 minute walk test done at initial orientation visit.  The plan for exercise progression was also introduced and progression will be customized based on patient's performance and goals. 30 Day review completed. Medical Director ITP review done; changes made as directed and signed approval by Medical Director. New to program. 30 Day review completed. Medical Director ITP review done; changes made as directed and signed approval by  Medical Director.      Comments: 30 day review

## 2024-04-15 NOTE — Progress Notes (Signed)
 Daily Session Note  Patient Details  Name: Daniel Holden MRN: [id number] Date of Birth: 1945-09-06 Referring Provider:   Flowsheet Row Cardiac Rehab from 02/19/2024 in [facility] Cardiac and Pulmonary Rehab  Referring Provider Dr. Wilbert Bihari, MD    Encounter Date: 04/15/2024  Check In:  Session Check In - 04/15/24 0942       Check-In   Supervising physician immediately available to respond to emergencies See telemetry face sheet for immediately available ER MD    Location [facility]-Cardiac & Pulmonary Rehab    Staff Present Burnard Hint BS, ACSM CEP, Exercise Physiologist;Noah Tickle, BS, Exercise Physiologist;Joseph Rolinda RCP,RRT,BSRT;Abdoulaye Drum RN,BSN    Virtual Visit No    Medication changes reported     No    Fall or balance concerns reported    No    Tobacco Cessation No Change    Warm-up and Cool-down Performed on first and last piece of equipment    Resistance Training Performed Yes    VAD Patient? No    PAD/SET Patient? No      Pain Assessment   Currently in Pain? No/denies             Social History   Tobacco Use  Smoking Status Never  Smokeless Tobacco Never    Goals Met:  Independence with exercise equipment Exercise tolerated well No report of concerns or symptoms today Strength training completed today  Goals Unmet:  Not Applicable  Comments: Pt able to follow exercise prescription today without complaint.  Will continue to monitor for progression.   Reviewed home exercise with pt today.  Pt plans to use his RB at home and possibly join [employer] for exercise.  Reviewed THR, pulse, RPE, sign and symptoms, pulse oximetery and when to call 911 or MD.  Also discussed weather considerations and indoor options.  Pt voiced understanding.    Dr. Oneil Pinal is Medical Director for [facility] Cardiac Rehabilitation.  Dr. Fuad Aleskerov is Medical Director for [facility] Pulmonary Rehabilitation.

## 2024-04-16 DIAGNOSIS — E119 Type 2 diabetes mellitus without complications: Secondary | ICD-10-CM | POA: Diagnosis not present

## 2024-04-17 ENCOUNTER — Encounter: Admitting: Emergency Medicine

## 2024-04-17 DIAGNOSIS — Z951 Presence of aortocoronary bypass graft: Secondary | ICD-10-CM | POA: Diagnosis not present

## 2024-04-17 DIAGNOSIS — I213 ST elevation (STEMI) myocardial infarction of unspecified site: Secondary | ICD-10-CM | POA: Diagnosis not present

## 2024-04-17 NOTE — Progress Notes (Signed)
 Daily Session Note  Patient Details  Name: KINNICK MAUS MRN: [id number] Date of Birth: Apr 11, 1946 Referring Provider:   Flowsheet Row Cardiac Rehab from 02/19/2024 in [facility] Cardiac and Pulmonary Rehab  Referring Provider Dr. Wilbert Bihari, MD    Encounter Date: 04/17/2024  Check In:  Session Check In - 04/17/24 0920       Check-In   Supervising physician immediately available to respond to emergencies See telemetry face sheet for immediately available ER MD    Location [facility]-Cardiac & Pulmonary Rehab    Staff Present Leita Franks RN,BSN;Joseph [facility] BS, Exercise Physiologist;Noah Tickle, BS, Exercise Physiologist    Virtual Visit No    Medication changes reported     No    Fall or balance concerns reported    No    Tobacco Cessation No Change    Warm-up and Cool-down Performed on first and last piece of equipment    Resistance Training Performed Yes    VAD Patient? No    PAD/SET Patient? No      Pain Assessment   Currently in Pain? No/denies             Social History   Tobacco Use  Smoking Status Never  Smokeless Tobacco Never    Goals Met:  Independence with exercise equipment Exercise tolerated well No report of concerns or symptoms today Strength training completed today  Goals Unmet:  Not Applicable  Comments: Pt able to follow exercise prescription today without complaint.  Will continue to monitor for progression.    Dr. Oneil Pinal is Medical Director for [facility] Cardiac Rehabilitation.  Dr. Fuad Aleskerov is Medical Director for [facility] Pulmonary Rehabilitation.

## 2024-04-20 ENCOUNTER — Encounter: Payer: Self-pay | Admitting: *Deleted

## 2024-04-20 ENCOUNTER — Encounter

## 2024-04-20 DIAGNOSIS — Z951 Presence of aortocoronary bypass graft: Secondary | ICD-10-CM | POA: Diagnosis not present

## 2024-04-20 DIAGNOSIS — M5432 Sciatica, left side: Secondary | ICD-10-CM | POA: Diagnosis not present

## 2024-04-20 DIAGNOSIS — I213 ST elevation (STEMI) myocardial infarction of unspecified site: Secondary | ICD-10-CM | POA: Diagnosis not present

## 2024-04-20 DIAGNOSIS — M4316 Spondylolisthesis, lumbar region: Secondary | ICD-10-CM | POA: Diagnosis not present

## 2024-04-20 DIAGNOSIS — Z7901 Long term (current) use of anticoagulants: Secondary | ICD-10-CM | POA: Diagnosis not present

## 2024-04-20 NOTE — Progress Notes (Signed)
 Daily Session Note  Patient Details  Name: Daniel Holden MRN: [id number] Date of Birth: [birth year] Referring Provider:   Flowsheet Row Cardiac Rehab from 02/19/2024 in [facility] Cardiac and Pulmonary Rehab  Referring Provider Dr. Wilbert Bihari, MD    Encounter Date: 04/20/2024  Check In:  Session Check In - 04/20/24 0944       Check-In   Supervising physician immediately available to respond to emergencies See telemetry face sheet for immediately available ER MD    Location [facility]-Cardiac & Pulmonary Rehab    Staff Present Burnard Davenport RN,BSN,MPA;Joseph Rolinda RCP,RRT,BSRT;Laura Cates RN,BSN;Maxon Burnell BS, Exercise Physiologist;Ruchy Wildrick Dyane BS, ACSM CEP, Exercise Physiologist    Virtual Visit No    Medication changes reported     No    Fall or balance concerns reported    No    Tobacco Cessation No Change    Warm-up and Cool-down Performed on first and last piece of equipment    Resistance Training Performed Yes    VAD Patient? No    PAD/SET Patient? No      Pain Assessment   Currently in Pain? No/denies             Social History   Tobacco Use  Smoking Status Never  Smokeless Tobacco Never    Goals Met:  Independence with exercise equipment Exercise tolerated well No report of concerns or symptoms today Strength training completed today  Goals Unmet:  Not Applicable  Comments: Pt able to follow exercise prescription today without complaint.  Will continue to monitor for progression.    Dr. Oneil Pinal is Medical Director for [facility] Cardiac Rehabilitation.  Dr. Fuad Aleskerov is Medical Director for [facility] Pulmonary Rehabilitation.

## 2024-04-22 ENCOUNTER — Encounter

## 2024-04-24 ENCOUNTER — Encounter

## 2024-04-27 ENCOUNTER — Ambulatory Visit

## 2024-04-28 DIAGNOSIS — M48061 Spinal stenosis, lumbar region without neurogenic claudication: Secondary | ICD-10-CM | POA: Diagnosis not present

## 2024-04-28 DIAGNOSIS — M4316 Spondylolisthesis, lumbar region: Secondary | ICD-10-CM | POA: Diagnosis not present

## 2024-04-28 DIAGNOSIS — R937 Abnormal findings on diagnostic imaging of other parts of musculoskeletal system: Secondary | ICD-10-CM | POA: Diagnosis not present

## 2024-04-28 DIAGNOSIS — M51369 Other intervertebral disc degeneration, lumbar region without mention of lumbar back pain or lower extremity pain: Secondary | ICD-10-CM | POA: Diagnosis not present

## 2024-04-29 ENCOUNTER — Encounter: Payer: Self-pay | Admitting: Cardiology

## 2024-04-29 ENCOUNTER — Ambulatory Visit

## 2024-05-01 ENCOUNTER — Other Ambulatory Visit: Payer: Self-pay | Admitting: Cardiology

## 2024-05-01 ENCOUNTER — Encounter

## 2024-05-04 ENCOUNTER — Encounter

## 2024-05-04 DIAGNOSIS — M51362 Other intervertebral disc degeneration, lumbar region with discogenic back pain and lower extremity pain: Secondary | ICD-10-CM | POA: Diagnosis not present

## 2024-05-04 DIAGNOSIS — M6284 Sarcopenia: Secondary | ICD-10-CM | POA: Diagnosis not present

## 2024-05-04 DIAGNOSIS — M48062 Spinal stenosis, lumbar region with neurogenic claudication: Secondary | ICD-10-CM | POA: Diagnosis not present

## 2024-05-04 DIAGNOSIS — M47896 Other spondylosis, lumbar region: Secondary | ICD-10-CM | POA: Diagnosis not present

## 2024-05-04 DIAGNOSIS — M4316 Spondylolisthesis, lumbar region: Secondary | ICD-10-CM | POA: Diagnosis not present

## 2024-05-04 DIAGNOSIS — Z7902 Long term (current) use of antithrombotics/antiplatelets: Secondary | ICD-10-CM | POA: Diagnosis not present

## 2024-05-05 ENCOUNTER — Telehealth (HOSPITAL_BASED_OUTPATIENT_CLINIC_OR_DEPARTMENT_OTHER): Payer: Self-pay

## 2024-05-05 NOTE — Telephone Encounter (Signed)
 Dr. Shlomo, you recently saw this pt in clinic. Are you able to comment on surgical clearance for upcoming lumbar epidural steroid injection scheduled for TBD?   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  Please route your response to P CV DIV PREOP. Thank you!

## 2024-05-05 NOTE — Telephone Encounter (Signed)
   Pre-operative Risk Assessment    Patient Name: Daniel Holden  DOB: [birth year] MRN: [id number]   Date of last office visit: 02/27/24 with Dr. Shlomo  Date of next office visit: 05/29/24 with Dr. Emelia   Request for Surgical Clearance    Procedure:  Lumbar Epidural Steroid Injection  Date of Surgery:  Clearance TBD                                 Surgeon:  not indicated Surgeon's Group or Practice Name:  [facility] Phone number:  [phone] Fax number:  [phone]   Type of Clearance Requested:   - Medical  - Pharmacy:  Hold Clopidogrel  (Plavix ) for 7 days prior and aspirin  not indicated.    Type of Anesthesia:  None    Additional requests/questions:    Signed, Augustin JONETTA Daring   05/05/2024, 12:17 PM

## 2024-05-06 ENCOUNTER — Encounter

## 2024-05-06 NOTE — Telephone Encounter (Addendum)
   Patient Name: [name]  DOB: [birth year] MRN: [id number]  Primary Cardiologist: Wilbert Bihari, MD  Chart reviewed as part of pre-operative protocol coverage. Given past medical history and time since last visit, based on ACC/AHA guidelines, ZAYAN DELVECCHIO is at acceptable risk for the planned procedure without further cardiovascular testing.   Patient was doing well at his follow up visit with Dr. Bihari on 02/27/2024, no cardiac complaints at that time.  Patient has been present and active at cardiac rehab from August through the end of October.  Was able to perform prescribed exercises with no cardiovascular complaints. Per Dr. Bihari, OK to hold ASA and Plavix  for spinal injection.  Per office protocol, if patient is without any new symptoms or concerns at the time of their virtual visit, he/she may hold Plavix  for 5 days prior to procedure. Please resume Plavix  as soon as possible postprocedure, at the discretion of the surgeon.   Regarding ASA therapy, it may be stopped 5-7 days prior to surgery with a plan to resume it as soon as felt to be feasible from a surgical standpoint in the post-operative period.   The patient was advised that if he develops new symptoms prior to surgery to contact our office to arrange for a follow-up visit, and he verbalized understanding.  I will route this recommendation to the requesting party via Epic fax function and remove from pre-op pool.  Please call with questions.  Reuel Lamadrid E Rayven Rettig, NP 05/06/2024, 10:17 AM

## 2024-05-08 ENCOUNTER — Encounter

## 2024-05-11 ENCOUNTER — Ambulatory Visit

## 2024-05-13 ENCOUNTER — Encounter

## 2024-05-13 ENCOUNTER — Ambulatory Visit: Attending: Cardiology

## 2024-05-13 DIAGNOSIS — I480 Paroxysmal atrial fibrillation: Secondary | ICD-10-CM

## 2024-05-13 DIAGNOSIS — I213 ST elevation (STEMI) myocardial infarction of unspecified site: Secondary | ICD-10-CM

## 2024-05-13 DIAGNOSIS — Z951 Presence of aortocoronary bypass graft: Secondary | ICD-10-CM

## 2024-05-13 NOTE — Progress Notes (Signed)
 Cardiac Individual Treatment Plan  Patient Details  Name: Daniel Holden MRN: [id number] Date of Birth: [birth year] Referring Provider:   Flowsheet Row Cardiac Rehab from 02/19/2024 in [facility] Cardiac and Pulmonary Rehab  Referring Provider Dr. Wilbert Bihari, MD    Initial Encounter Date:  Flowsheet Row Cardiac Rehab from 02/19/2024 in [facility] Cardiac and Pulmonary Rehab  Date 02/19/24    Visit Diagnosis: S/P CABG x 3  ST elevation myocardial infarction (STEMI), unspecified artery (HCC)  Patient's Home Medications on Admission:  Current Outpatient Medications:    ALPRAZolam  (XANAX ) 0.5 MG tablet, Take 0.25 mg by mouth at bedtime as needed for sleep., Disp: , Rfl:    amLODipine  (NORVASC ) 5 MG tablet, TAKE 1 TABLET EVERY DAY, Disp: 90 tablet, Rfl: 3   aspirin  81 MG EC tablet, Take 81 mg by mouth daily., Disp: , Rfl:    atorvastatin  (LIPITOR ) 40 MG tablet, Take 1 tablet (40 mg total) by mouth daily., Disp: 90 tablet, Rfl: 3   Cholecalciferol  250 MCG (10000 UT) TABS, Take 1,000 Units by mouth daily., Disp: , Rfl:    clopidogrel  (PLAVIX ) 75 MG tablet, Take 1 tablet (75 mg total) by mouth daily., Disp: 90 tablet, Rfl: 2   cyanocobalamin  1000 MCG tablet, Take 1,000 mcg by mouth daily., Disp: , Rfl:    Fe Fum-Vit C-Vit B12-FA (TRIGELS-F FORTE) CAPS capsule, Take 1 capsule by mouth daily after breakfast., Disp: 30 capsule, Rfl: 2   fluticasone  (FLONASE ) 50 MCG/ACT nasal spray, Place 2 sprays into both nostrils daily. , Disp: , Rfl:    losartan  (COZAAR ) 25 MG tablet, Take 1 tablet (25 mg total) by mouth daily., Disp: 90 tablet, Rfl: 3   melatonin 3 MG TABS tablet, Take 10 mg by mouth at bedtime., Disp: , Rfl:    metFORMIN  (GLUCOPHAGE ) 500 MG tablet, Take 500 mg by mouth 2 (two) times daily., Disp: , Rfl:    metoprolol  tartrate (LOPRESSOR ) 25 MG tablet, Take 1 tablet (25 mg total) by mouth 2 (two) times daily. (Patient taking differently: Take 25 mg by mouth daily.), Disp: 180 tablet, Rfl: 3    pantoprazole  (PROTONIX ) 40 MG tablet, Take 40 mg by mouth 2 (two) times daily before a meal., Disp: , Rfl:   Past Medical History: Past Medical History:  Diagnosis Date   Allergy    Angina of effort    Anxiety    CAD (coronary artery disease), native coronary artery    s/p cath 12/2023 CTO of RCA, 95% LAD, 90% LCx and echo was normal.  He underwent CABG with LIMA-LAD, SVG-PL and a left radial-OM .   Cancer [facility])    Carpal tunnel syndrome    GERD (gastroesophageal reflux disease)    HLD (hyperlipidemia)    Hypertension    Lung nodule    Obesity    PAF (paroxysmal atrial fibrillation) (HCC) 12/2023   s/p MAZE and LA clippling   Pancreatitis 2015   Sleep apnea     Tobacco Use: Social History   Tobacco Use  Smoking Status Never  Smokeless Tobacco Never    Labs: Review Flowsheet  More data exists      Latest Ref Rng & Units 11/07/2020 03/19/2021 01/02/2024 01/05/2024 01/06/2024  Labs for ITP Cardiac and Pulmonary Rehab  Cholestrol 0 - 200 mg/dL 895  - 880  - -  LDL (calc) 0 - 99 mg/dL 26  - 33  - -  HDL-C >59 mg/dL 58  - 60  - -  Trlycerides <150 mg/dL  111  - 132  - -  Hemoglobin A1c 4.8 - 5.6 % - 6.6  6.1  - -  PH, Arterial 7.35 - 7.45 - - - 7.44  7.410  7.362  7.347  7.372  7.425  7.347  7.319   PCO2 arterial 32 - 48 mmHg - - - 31  32.1  33.2  38.9  37.6  35.7  34.9  41.1   Bicarbonate 20.0 - 28.0 mmol/L - - - 21.1  20.3  18.9  21.7  21.8  23.4  19.1  21.1   TCO2 22 - 32 mmol/L - - - - 21  20  23  22  23  25  24  24  20  22  21  22    Acid-base deficit 0.0 - 2.0 mmol/L - - - 2.1  4.0  6.0  4.0  3.0  1.0  6.0  5.0   O2 Saturation % - - - 99.4  98  99  97  99  100  100  100     Details       Multiple values from one day are sorted in reverse-chronological order          Exercise Target Goals: Exercise Program Goal: Individual exercise prescription set using results from initial 6 min walk test and THRR while considering  patient's activity barriers and safety.    Exercise Prescription Goal: Initial exercise prescription builds to 30-45 minutes a day of aerobic activity, 2-3 days per week.  Home exercise guidelines will be given to patient during program as part of exercise prescription that the participant will acknowledge.   Education: Aerobic Exercise: - Group verbal and visual presentation on the components of exercise prescription. Introduces F.I.T.T principle from ACSM for exercise prescriptions.  Reviews F.I.T.T. principles of aerobic exercise including progression. Written material provided at class time. Flowsheet Row Cardiac Rehab from 05/24/2021 in [facility] Cardiac and Pulmonary Rehab  Education need identified 03/09/21    Education: Resistance Exercise: - Group verbal and visual presentation on the components of exercise prescription. Introduces F.I.T.T principle from ACSM for exercise prescriptions  Reviews F.I.T.T. principles of resistance exercise including progression. Written material provided at class time. Flowsheet Row Cardiac Rehab from 05/24/2021 in [facility] Cardiac and Pulmonary Rehab  Date 04/26/21  Educator [facility]  Instruction Review Code 1- Verbalizes Understanding     Education: Exercise & Equipment Safety: - Individual verbal instruction and demonstration of equipment use and safety with use of the equipment. Flowsheet Row Cardiac Rehab from 03/11/2024 in [facility] Cardiac and Pulmonary Rehab  Date 02/19/24  Educator NT  Instruction Review Code 1- Verbalizes Understanding    Education: Exercise Physiology & General Exercise Guidelines: - Group verbal and written instruction with models to review the exercise physiology of the cardiovascular system and associated critical values. Provides general exercise guidelines with specific guidelines to those with heart or lung disease. Written material provided at class time. Flowsheet Row Cardiac Rehab from 05/24/2021 in [facility] Cardiac and Pulmonary Rehab  Date 04/12/21  Educator [facility]   Instruction Review Code 1- Verbalizes Understanding    Education: Flexibility, Balance, Mind/Body Relaxation: - Group verbal and visual presentation with interactive activity on the components of exercise prescription. Introduces F.I.T.T principle from ACSM for exercise prescriptions. Reviews F.I.T.T. principles of flexibility and balance exercise training including progression. Also discusses the mind body connection.  Reviews various relaxation techniques to help reduce and manage stress (i.e. Deep breathing, progressive muscle relaxation, and visualization). Balance handout provided  to take home. Written material provided at class time. Flowsheet Row Cardiac Rehab from 05/24/2021 in [facility] Cardiac and Pulmonary Rehab  Date 05/03/21  Educator AS  Instruction Review Code 1- Verbalizes Understanding    Activity Barriers & Risk Stratification:  Activity Barriers & Cardiac Risk Stratification - 02/14/24 1033       Activity Barriers & Cardiac Risk Stratification   Activity Barriers Left Knee Replacement;Joint Problems;Back Problems    Cardiac Risk Stratification High          6 Minute Walk:  6 Minute Walk     Row Name 02/19/24 1143         6 Minute Walk   Phase Initial     Distance 1195 feet     Walk Time 6 minutes     # of Rest Breaks 0     MPH 2.26     METS 2.26     RPE 10     Perceived Dyspnea  1     VO2 Peak 7.92     Symptoms No     Resting HR 76 bpm     Resting BP 142/68     Resting Oxygen Saturation  96 %     Exercise Oxygen Saturation  during 6 min walk 96 %     Max Ex. HR 96 bpm     Max Ex. BP 160/72     2 Minute Post BP 154/72        Oxygen Initial Assessment:   Oxygen Re-Evaluation:   Oxygen Discharge (Final Oxygen Re-Evaluation):   Initial Exercise Prescription:  Initial Exercise Prescription - 02/19/24 1100       Date of Initial Exercise RX and Referring Provider   Date 02/19/24    Referring Provider Dr. Wilbert Bihari, MD      Oxygen    Maintain Oxygen Saturation 88% or higher      Treadmill   MPH 2.2    Grade 0    Minutes 15    METs 2.68      REL-XR   Level 3    Speed 50    Minutes 15    METs 2.26      Biostep-RELP   Level 2    SPM 50    Minutes 15    METs 2.26      Track   Laps 26    Minutes 15    METs 2.41      Prescription Details   Frequency (times per week) 3    Duration Progress to 30 minutes of continuous aerobic without signs/symptoms of physical distress      Intensity   THRR 40-80% of Max Heartrate 103-131    Ratings of Perceived Exertion 11-13    Perceived Dyspnea 0-4      Progression   Progression Continue to progress workloads to maintain intensity without signs/symptoms of physical distress.      Resistance Training   Training Prescription Yes    Weight 5 lb    Reps 10-15          Perform Capillary Blood Glucose checks as needed.  Exercise Prescription Changes:   Exercise Prescription Changes     Row Name 02/19/24 1100 03/03/24 0800 03/19/24 1600 03/31/24 1400 04/15/24 0900     Response to Exercise   Blood Pressure (Admit) 142/68 114/60 124/80 106/60 --   Blood Pressure (Exercise) 160/72 158/70 162/70 152/60 --   Blood Pressure (Exit) 154/72 118/62 124/70 122/62 --   Heart  Rate (Admit) 76 bpm 81 bpm 77 bpm 78 bpm --   Heart Rate (Exercise) 96 bpm 107 bpm 104 bpm 108 bpm --   Heart Rate (Exit) 89 bpm 91 bpm 90 bpm 87 bpm --   Oxygen Saturation (Admit) 96 % -- -- -- --   Oxygen Saturation (Exercise) 96 % -- -- -- --   Rating of Perceived Exertion (Exercise) 10 13 14 13  --   Perceived Dyspnea (Exercise) 1 -- -- -- --   Symptoms none none none none --   Comments Results 1st 2 weeks of exercise sessions -- -- --   Duration -- Progress to 30 minutes of  aerobic without signs/symptoms of physical distress Progress to 30 minutes of  aerobic without signs/symptoms of physical distress Continue with 30 min of aerobic exercise without signs/symptoms of physical distress.  Continue with 30 min of aerobic exercise without signs/symptoms of physical distress.   Intensity -- THRR unchanged THRR unchanged THRR unchanged THRR unchanged     Progression   Progression -- Continue to progress workloads to maintain intensity without signs/symptoms of physical distress. Continue to progress workloads to maintain intensity without signs/symptoms of physical distress. Continue to progress workloads to maintain intensity without signs/symptoms of physical distress. Continue to progress workloads to maintain intensity without signs/symptoms of physical distress.   Average METs -- 2.86 3.57 3.29 3.29     Resistance Training   Training Prescription -- Yes Yes Yes Yes   Weight -- 5 lb 5 lb 5 lb 5 lb   Reps -- 10-15 10-15 10-15 10-15     Interval Training   Interval Training -- No No No No     Treadmill   MPH -- 2.4 2.5 2.5 2.5   Grade -- 0 0 0 0   Minutes -- 15 15 15 15    METs -- 2.84 2.91 2.91 2.91     REL-XR   Level -- 4 7 4 4    Minutes -- 15 15 15 15    METs -- -- 5.4 5 5      Biostep-RELP   Level -- 3 3 3 3    Minutes -- 15 15 15 15    METs -- 3 3 3 3      Home Exercise Plan   Plans to continue exercise at -- -- -- -- Home (comment)  RB at home and possibliy join [employer]   Frequency -- -- -- -- Add 2 additional days to program exercise sessions.   Initial Home Exercises Provided -- -- -- -- 04/15/24     Oxygen   Maintain Oxygen Saturation -- 88% or higher 88% or higher 88% or higher 88% or higher    Row Name 04/28/24 1300             Response to Exercise   Blood Pressure (Admit) 126/76       Blood Pressure (Exercise) 148/80       Blood Pressure (Exit) 122/74       Heart Rate (Admit) 71 bpm       Heart Rate (Exercise) 110 bpm       Heart Rate (Exit) 84 bpm       Rating of Perceived Exertion (Exercise) 14       Symptoms none       Duration Continue with 30 min of aerobic exercise without signs/symptoms of physical distress.       Intensity THRR  unchanged         Progression  Progression Continue to progress workloads to maintain intensity without signs/symptoms of physical distress.       Average METs 3.26         Resistance Training   Training Prescription Yes       Weight 5 lb       Reps 10-15         Interval Training   Interval Training No         Treadmill   MPH 2.6       Grade 0       Minutes 15       METs 2.99         NuStep   Level 4       Minutes 15       METs 3.4         REL-XR   Level 4       Minutes 15       METs 5.1         Biostep-RELP   Level 3       Minutes 15       METs 3         Home Exercise Plan   Plans to continue exercise at Home (comment)  RB at home and possibliy join [employer]       Frequency Add 2 additional days to program exercise sessions.       Initial Home Exercises Provided 04/15/24         Oxygen   Maintain Oxygen Saturation 88% or higher          Exercise Comments:   Exercise Comments     Row Name 02/21/24 9071           Exercise Comments First full day of exercise!  Patient was oriented to gym and equipment including functions, settings, policies, and procedures.  Patient's individual exercise prescription and treatment plan were reviewed.  All starting workloads were established based on the results of the 6 minute walk test done at initial orientation visit.  The plan for exercise progression was also introduced and progression will be customized based on patient's performance and goals.          Exercise Goals and Review:   Exercise Goals     Row Name 02/19/24 1157             Exercise Goals   Increase Physical Activity Yes       Intervention Provide advice, education, support and counseling about physical activity/exercise needs.;Develop an individualized exercise prescription for aerobic and resistive training based on initial evaluation findings, risk stratification, comorbidities and participant's personal goals.       Expected Outcomes  Short Term: Attend rehab on a regular basis to increase amount of physical activity.;Long Term: Add in home exercise to make exercise part of routine and to increase amount of physical activity.;Long Term: Exercising regularly at least 3-5 days a week.       Increase Strength and Stamina Yes       Intervention Provide advice, education, support and counseling about physical activity/exercise needs.;Develop an individualized exercise prescription for aerobic and resistive training based on initial evaluation findings, risk stratification, comorbidities and participant's personal goals.       Expected Outcomes Short Term: Increase workloads from initial exercise prescription for resistance, speed, and METs.;Short Term: Perform resistance training exercises routinely during rehab and add in resistance training at home;Long Term: Improve cardiorespiratory fitness, muscular endurance and strength as measured by increased  METs and functional capacity ( )       Able to understand and use rate of perceived exertion (RPE) scale Yes       Intervention Provide education and explanation on how to use RPE scale       Expected Outcomes Short Term: Able to use RPE daily in rehab to express subjective intensity level;Long Term:  Able to use RPE to guide intensity level when exercising independently       Able to understand and use Dyspnea scale Yes       Intervention Provide education and explanation on how to use Dyspnea scale       Expected Outcomes Short Term: Able to use Dyspnea scale daily in rehab to express subjective sense of shortness of breath during exertion;Long Term: Able to use Dyspnea scale to guide intensity level when exercising independently       Knowledge and understanding of Target Heart Rate Range (THRR) Yes       Intervention Provide education and explanation of THRR including how the numbers were predicted and where they are located for reference       Expected Outcomes Short Term: Able to  state/look up THRR;Long Term: Able to use THRR to govern intensity when exercising independently;Short Term: Able to use daily as guideline for intensity in rehab       Able to check pulse independently Yes       Intervention Provide education and demonstration on how to check pulse in carotid and radial arteries.;Review the importance of being able to check your own pulse for safety during independent exercise       Expected Outcomes Short Term: Able to explain why pulse checking is important during independent exercise;Long Term: Able to check pulse independently and accurately       Understanding of Exercise Prescription Yes       Intervention Provide education, explanation, and written materials on patient's individual exercise prescription       Expected Outcomes Short Term: Able to explain program exercise prescription;Long Term: Able to explain home exercise prescription to exercise independently          Exercise Goals Re-Evaluation :  Exercise Goals Re-Evaluation     Row Name 02/21/24 9071 03/03/24 0815 03/16/24 0936 03/19/24 1643 03/31/24 1443     Exercise Goal Re-Evaluation   Exercise Goals Review Increase Physical Activity;Able to understand and use rate of perceived exertion (RPE) scale;Knowledge and understanding of Target Heart Rate Range (THRR);Understanding of Exercise Prescription;Increase Strength and Stamina;Able to understand and use Dyspnea scale;Able to check pulse independently Increase Physical Activity;Understanding of Exercise Prescription;Increase Strength and Stamina Increase Physical Activity;Increase Strength and Stamina;Understanding of Exercise Prescription Increase Physical Activity;Increase Strength and Stamina;Understanding of Exercise Prescription Increase Physical Activity;Increase Strength and Stamina;Understanding of Exercise Prescription   Comments Reviewed RPE and dyspnea scale, THR and program prescription with pt today.  Pt voiced understanding and was  given a copy of goals to take home. Daniel Holden is off to a good start in the program and completed his first 2 weeks in this review. He worked at level 4 on the XR and level 3 on the biostep. He had a workload on the treadmill of a speed of 2.4 mph with no incline. We will continue to monitor his progress in the program. Daniel Holden is doing well in rehab. He is walking some at home on days he is not attending rehab. His wife has black card for [employer] and sometimes he will go with her  to work on his core. Daniel Holden continues to do well in rehab. He was recently able to increase his level on the XR from 4 to 7. He was also able to increase his speed on the treadmill from 2.4mph to 2.5mph. We will continue to monitor his progress in the program. Daniel Holden continues to do well in rehab. He continues to walk on the treadmill at a speed of 2.5 mph with no incline. He also has consistently worked at level 2 on the biostep. We will continue to monitor his progress in the program.   Expected Outcomes Short: Use RPE daily to regulate intensity. Long: Follow program prescription in THR. Short: Continue to follow current exercise prescription. Long: Continue exercise to improve strength and stamina. STG: Continue to attend rehab and incorperate more home exercise as able. LTG: Continue exercise to improve strength and stamina. Short: Continue to increase treadmill workloads. Long: Continue exercise to improve strength and stamina. Short: Continue to progressively increase workloads. Long: Continue exercise to improve strength and stamina.    Row Name 04/15/24 9044 04/17/24 0951 04/28/24 1349         Exercise Goal Re-Evaluation   Exercise Goals Review Increase Physical Activity;Able to understand and use rate of perceived exertion (RPE) scale;Knowledge and understanding of Target Heart Rate Range (THRR);Understanding of Exercise Prescription;Increase Strength and Stamina;Able to understand and use Dyspnea scale;Able to check pulse  independently Increase Physical Activity;Increase Strength and Stamina;Understanding of Exercise Prescription Increase Physical Activity;Increase Strength and Stamina;Understanding of Exercise Prescription     Comments Reviewed home exercise with pt today.  Pt plans to use his RB at home and possibly join [employer] for exercise.  Reviewed THR, pulse, RPE, sign and symptoms, pulse oximetery and when to call 911 or MD.  Also discussed weather considerations and indoor options.  Pt voiced understanding. Daniel Holden did not attend rehab during the last review period. He has since returned to the program and has attended three sessions. We will continue to monitor his progress in the program. Daniel Holden is doing well in rehab. He recently increased his speed to 2.6 mph with no incline. He also began using the T4 nustep at level 4 and continues to use 5 lb hand weights for resistance training. We will continue to monitor his progress in the program.     Expected Outcomes Short: add 1-2 days a week or exercise on off days of rehab. Long: maintain independent exercise routine upon graduation from cardiac rehab. Short: Continue to attend rehab regularly. Long: Continue exercise to improve strength and stamina. Short: Continue to progressively increase treadmill workload. Long: Continue exercise to improve strength and stamina.        Discharge Exercise Prescription (Final Exercise Prescription Changes):  Exercise Prescription Changes - 04/28/24 1300       Response to Exercise   Blood Pressure (Admit) 126/76    Blood Pressure (Exercise) 148/80    Blood Pressure (Exit) 122/74    Heart Rate (Admit) 71 bpm    Heart Rate (Exercise) 110 bpm    Heart Rate (Exit) 84 bpm    Rating of Perceived Exertion (Exercise) 14    Symptoms none    Duration Continue with 30 min of aerobic exercise without signs/symptoms of physical distress.    Intensity THRR unchanged      Progression   Progression Continue to progress workloads  to maintain intensity without signs/symptoms of physical distress.    Average METs 3.26      Resistance Training  Training Prescription Yes    Weight 5 lb    Reps 10-15      Interval Training   Interval Training No      Treadmill   MPH 2.6    Grade 0    Minutes 15    METs 2.99      NuStep   Level 4    Minutes 15    METs 3.4      REL-XR   Level 4    Minutes 15    METs 5.1      Biostep-RELP   Level 3    Minutes 15    METs 3      Home Exercise Plan   Plans to continue exercise at Home (comment)   RB at home and possibliy join [employer]   Frequency Add 2 additional days to program exercise sessions.    Initial Home Exercises Provided 04/15/24      Oxygen   Maintain Oxygen Saturation 88% or higher          Nutrition:  Target Goals: Understanding of nutrition guidelines, daily intake of sodium 1500mg , cholesterol 200mg , calories 30% from fat and 7% or less from saturated fats, daily to have 5 or more servings of fruits and vegetables.  Education: Nutrition 1 -Group instruction provided by verbal, written material, interactive activities, discussions, models, and posters to present general guidelines for heart healthy nutrition including macronutrients, label reading, and promoting whole foods over processed counterparts. Education serves as pensions consultant of discussion of heart healthy eating for all. Written material provided at class time.    Education: Nutrition 2 -Group instruction provided by verbal, written material, interactive activities, discussions, models, and posters to present general guidelines for heart healthy nutrition including sodium, cholesterol, and saturated fat. Providing guidance of habit forming to improve blood pressure, cholesterol, and body weight. Written material provided at class time.     Biometrics:  Pre Biometrics - 02/19/24 1157       Pre Biometrics   Height 5' 6.5 (1.689 m)    Weight 210 lb 3.2 oz (95.3 kg)    Waist  Circumference 42.5 inches    Hip Circumference 42 inches    Waist to Hip Ratio 1.01 %    BMI (Calculated) 33.42    Single Leg Stand 30 seconds           Nutrition Therapy Plan and Nutrition Goals:  Nutrition Therapy & Goals - 02/19/24 1141       Nutrition Therapy   RD appointment deferred Yes      Intervention Plan   Intervention Prescribe, educate and counsel regarding individualized specific dietary modifications aiming towards targeted core components such as weight, hypertension, lipid management, diabetes, heart failure and other comorbidities.    Expected Outcomes Short Term Goal: Understand basic principles of dietary content, such as calories, fat, sodium, cholesterol and nutrients.;Short Term Goal: A plan has been developed with personal nutrition goals set during dietitian appointment.;Long Term Goal: Adherence to prescribed nutrition plan.          Nutrition Assessments:  MEDIFICTS Score Key: >=70 Need to make dietary changes  40-70 Heart Healthy Diet <= 40 Therapeutic Level Cholesterol Diet  Flowsheet Row Cardiac Rehab from 02/19/2024 in [facility] Cardiac and Pulmonary Rehab  Picture Your Plate Total Score on Admission 66   Picture Your Plate Scores: <59 Unhealthy dietary pattern with much room for improvement. 41-50 Dietary pattern unlikely to meet recommendations for good health and room for improvement. 51-60 More  healthful dietary pattern, with some room for improvement.  >60 Healthy dietary pattern, although there may be some specific behaviors that could be improved.    Nutrition Goals Re-Evaluation:  Nutrition Goals Re-Evaluation     Row Name 03/16/24 0939 04/13/24 0931           Goals   Comment Daniel Holden deferred RD appointment Daniel Holden deferred RD appointment         Nutrition Goals Discharge (Final Nutrition Goals Re-Evaluation):  Nutrition Goals Re-Evaluation - 04/13/24 0931       Goals   Comment Daniel Holden deferred RD appointment           Psychosocial: Target Goals: Acknowledge presence or absence of significant depression and/or stress, maximize coping skills, provide positive support system. Participant is able to verbalize types and ability to use techniques and skills needed for reducing stress and depression.   Education: Stress, Anxiety, and Depression - Group verbal and visual presentation to define topics covered.  Reviews how body is impacted by stress, anxiety, and depression.  Also discusses healthy ways to reduce stress and to treat/manage anxiety and depression. Written material provided at class time. Flowsheet Row Cardiac Rehab from 03/11/2024 in [facility] Cardiac and Pulmonary Rehab  Education need identified 02/19/24    Education: Sleep Hygiene -Provides group verbal and written instruction about how sleep can affect your health.  Define sleep hygiene, discuss sleep cycles and impact of sleep habits. Review good sleep hygiene tips.   Initial Review & Psychosocial Screening:  Initial Psych Review & Screening - 02/14/24 1042       Initial Review   Current issues with Current Stress Concerns      Family Dynamics   Good Support System? Yes      Barriers   Psychosocial barriers to participate in program There are no identifiable barriers or psychosocial needs.      Screening Interventions   Interventions Encouraged to exercise;Provide feedback about the scores to participant;To provide support and resources with identified psychosocial needs    Expected Outcomes Short Term goal: Utilizing psychosocial counselor, staff and physician to assist with identification of specific Stressors or current issues interfering with healing process. Setting desired goal for each stressor or current issue identified.;Long Term Goal: Stressors or current issues are controlled or eliminated.;Short Term goal: Identification and review with participant of any Quality of Life or Depression concerns found by scoring the  questionnaire.;Long Term goal: The participant improves quality of Life and PHQ9 Scores as seen by post scores and/or verbalization of changes          Quality of Life Scores:   Quality of Life - 02/19/24 1142       Quality of Life   Select Quality of Life      Quality of Life Scores   Health/Function Pre 23.8 %    Socioeconomic Pre 25.63 %    Psych/Spiritual Pre 26.64 %    Family Pre 30 %    GLOBAL Pre 25.67 %         Scores of 19 and below usually indicate a poorer quality of life in these areas.  A difference of  2-3 points is a clinically meaningful difference.  A difference of 2-3 points in the total score of the Quality of Life Index has been associated with significant improvement in overall quality of life, self-image, physical symptoms, and general health in studies assessing change in quality of life.  PHQ-9: Review Flowsheet  More data may exist  02/19/2024 05/26/2021 03/09/2021 01/23/2021 07/14/2020  Depression screen PHQ 2/9  Decreased Interest 0 0 0 0 0  Down, Depressed, Hopeless 0 0 0 0 0  PHQ - 2 Score 0 0 0 0 0  Altered sleeping 0 0 0 - -  Tired, decreased energy 2 1 1  - -  Change in appetite 0 1 1 - -  Feeling bad or failure about yourself  0 0 0 - -  Trouble concentrating 0 0 0 - -  Moving slowly or fidgety/restless 1 0 0 - -  Suicidal thoughts 0 0 0 - -  PHQ-9 Score 3  2  2   - -  Difficult doing work/chores Not difficult at all Not difficult at all Not difficult at all - -    Details       Data saved with a previous flowsheet row definition        Interpretation of Total Score  Total Score Depression Severity:  1-4 = Minimal depression, 5-9 = Mild depression, 10-14 = Moderate depression, 15-19 = Moderately severe depression, 20-27 = Severe depression   Psychosocial Evaluation and Intervention:  Psychosocial Evaluation - 02/14/24 1047       Psychosocial Evaluation & Interventions   Interventions Encouraged to exercise with the program and  follow exercise prescription;Relaxation education    Comments Daniel 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 He states he has been walking consistently, but mentions some stress because he feels like he should be further along in his progress. He stays active and is ready to get involved in the program to increase his stamina. He started sleeping in his bed this past week and has been getting a consistent 8 hours which he is pleased with.    Expected Outcomes Short: attend cardiac rehab for education and exercise Long: develop and maintain positive self care habits    Continue Psychosocial Services  Follow up required by staff          Psychosocial Re-Evaluation:  Psychosocial Re-Evaluation     Row Name 03/16/24 0938 04/13/24 0932           Psychosocial Re-Evaluation   Current issues with None Identified None Identified      Comments Daniel Holden denies any stress, anxiety, or depression at this time. He reports he sleeps well and wakes well rested. Patient reports no issues with their current mental states, sleep, stress, depression or anxiety. Will follow up with patient in a few weeks for any changes.      Expected Outcomes STG: Continue to attend rehab and focus on good sleep. LTG: Achieve and maintain postivie outlook on health and daily life Short: Continue to exercise regularly to support mental health and notify staff of any changes. Long: maintain mental health and well being through teaching of rehab or prescribed medications independently.      Interventions Encouraged to attend Cardiac Rehabilitation for the exercise Encouraged to attend Cardiac Rehabilitation for the exercise      Continue Psychosocial Services  Follow up required by staff Follow up required by staff         Psychosocial Discharge (Final Psychosocial Re-Evaluation):  Psychosocial Re-Evaluation - 04/13/24 0932       Psychosocial Re-Evaluation   Current issues with None Identified    Comments Patient  reports no issues with their current mental states, sleep, stress, depression or anxiety. Will follow up with patient in a few weeks for any changes.    Expected Outcomes Short: Continue  to exercise regularly to support mental health and notify staff of any changes. Long: maintain mental health and well being through teaching of rehab or prescribed medications independently.    Interventions Encouraged to attend Cardiac Rehabilitation for the exercise    Continue Psychosocial Services  Follow up required by staff          Vocational Rehabilitation: Provide vocational rehab assistance to qualifying candidates.   Vocational Rehab Evaluation & Intervention:  Vocational Rehab - 02/14/24 1042       Initial Vocational Rehab Evaluation & Intervention   Assessment shows need for Vocational Rehabilitation No          Education: Education Goals: Education classes will be provided on a variety of topics geared toward better understanding of heart health and risk factor modification. Participant will state understanding/return demonstration of topics presented as noted by education test scores.  Learning Barriers/Preferences:  Learning Barriers/Preferences - 02/14/24 1040       Learning Barriers/Preferences   Learning Barriers None    Learning Preferences Individual Instruction          General Cardiac Education Topics:  AED/CPR: - Group verbal and written instruction with the use of models to demonstrate the basic use of the AED with the basic ABC's of resuscitation.   Test and Procedures: - Group verbal and visual presentation and models provide information about basic cardiac anatomy and function. Reviews the testing methods done to diagnose heart disease and the outcomes of the test results. Describes the treatment choices: Medical Management, Angioplasty, or Coronary Bypass Surgery for treating various heart conditions including Myocardial Infarction, Angina, Valve Disease, and  Cardiac Arrhythmias. Written material provided at class time. Flowsheet Row Cardiac Rehab from 03/11/2024 in [facility] Cardiac and Pulmonary Rehab  Education need identified 02/19/24    Medication Safety: - Group verbal and visual instruction to review commonly prescribed medications for heart and lung disease. Reviews the medication, class of the drug, and side effects. Includes the steps to properly store meds and maintain the prescription regimen. Written material provided at class time. Flowsheet Row Cardiac Rehab from 05/24/2021 in [facility] Cardiac and Pulmonary Rehab  Date 05/17/21  Educator SB  Instruction Review Code 1- Verbalizes Understanding    Intimacy: - Group verbal instruction through game format to discuss how heart and lung disease can affect sexual intimacy. Written material provided at class time.   Know Your Numbers and Heart Failure: - Group verbal and visual instruction to discuss disease risk factors for cardiac and pulmonary disease and treatment options.  Reviews associated critical values for Overweight/Obesity, Hypertension, Cholesterol, and Diabetes.  Discusses basics of heart failure: signs/symptoms and treatments.  Introduces Heart Failure Zone chart for action plan for heart failure. Written material provided at class time. Flowsheet Row Cardiac Rehab from 05/24/2021 in [facility] Cardiac and Pulmonary Rehab  Date 05/24/21  Educator SB  Instruction Review Code 1- Verbalizes Understanding    Infection Prevention: - Provides verbal and written material to individual with discussion of infection control including proper hand washing and proper equipment cleaning during exercise session. Flowsheet Row Cardiac Rehab from 03/11/2024 in [facility] Cardiac and Pulmonary Rehab  Date 02/19/24  Educator NT  Instruction Review Code 1- Verbalizes Understanding    Falls Prevention: - Provides verbal and written material to individual with discussion of falls prevention and  safety. Flowsheet Row Cardiac Rehab from 03/11/2024 in [facility] Cardiac and Pulmonary Rehab  Date 02/19/24  Educator NT  Instruction Review Code 1- Verbalizes Understanding  Other: -Provides group and verbal instruction on various topics (see comments)   Knowledge Questionnaire Score:  Knowledge Questionnaire Score - 02/19/24 1141       Knowledge Questionnaire Score   Pre Score 24/26          Core Components/Risk Factors/Patient Goals at Admission:  Personal Goals and Risk Factors at Admission - 02/14/24 1035       Core Components/Risk Factors/Patient Goals on Admission    Weight Management Yes    Intervention Weight Management: Develop a combined nutrition and exercise program designed to reach desired caloric intake, while maintaining appropriate intake of nutrient and fiber, sodium and fats, and appropriate energy expenditure required for the weight goal.;Weight Management: Provide education and appropriate resources to help participant work on and attain dietary goals.;Weight Management/Obesity: Establish reasonable short term and long term weight goals.    Goal Weight: Long Term 185 lb (83.9 kg)    Expected Outcomes Short Term: Continue to assess and modify interventions until short term weight is achieved;Long Term: Adherence to nutrition and physical activity/exercise program aimed toward attainment of established weight goal;Weight Loss: Understanding of general recommendations for a balanced deficit meal plan, which promotes 1-2 lb weight loss per week and includes a negative energy balance of [phone] kcal/d;Understanding recommendations for meals to include 15-35% energy as protein, 25-35% energy from fat, 35-60% energy from carbohydrates, less than 200mg  of dietary cholesterol, 20-35 gm of total fiber daily;Understanding of distribution of calorie intake throughout the day with the consumption of 4-5 meals/snacks    Diabetes Yes    Intervention Provide education about  signs/symptoms and action to take for hypo/hyperglycemia.;Provide education about proper nutrition, including hydration, and aerobic/resistive exercise prescription along with prescribed medications to achieve blood glucose in normal ranges: Fasting glucose 65-99 mg/dL    Expected Outcomes Short Term: Participant verbalizes understanding of the signs/symptoms and immediate care of hyper/hypoglycemia, proper foot care and importance of medication, aerobic/resistive exercise and nutrition plan for blood glucose control.;Long Term: Attainment of HbA1C < 7%.    Hypertension Yes    Intervention Provide education on lifestyle modifcations including regular physical activity/exercise, weight management, moderate sodium restriction and increased consumption of fresh fruit, vegetables, and low fat dairy, alcohol moderation, and smoking cessation.;Monitor prescription use compliance.    Expected Outcomes Short Term: Continued assessment and intervention until BP is < 140/70mm HG in hypertensive participants. < 130/62mm HG in hypertensive participants with diabetes, heart failure or chronic kidney disease.;Long Term: Maintenance of blood pressure at goal levels.    Lipids Yes    Intervention Provide education and support for participant on nutrition & aerobic/resistive exercise along with prescribed medications to achieve LDL 70mg , HDL >40mg .    Expected Outcomes Short Term: Participant states understanding of desired cholesterol values and is compliant with medications prescribed. Participant is following exercise prescription and nutrition guidelines.;Long Term: Cholesterol controlled with medications as prescribed, with individualized exercise RX and with personalized nutrition plan. Value goals: LDL < 70mg , HDL > 40 mg.          Education:Diabetes - Individual verbal and written instruction to review signs/symptoms of diabetes, desired ranges of glucose level fasting, after meals and with exercise.  Acknowledge that pre and post exercise glucose checks will be done for 3 sessions at entry of program.   Core Components/Risk Factors/Patient Goals Review:   Goals and Risk Factor Review     Row Name 03/16/24 0940 04/13/24 0927           Core Components/Risk  Factors/Patient Goals Review   Personal Goals Review Hypertension;Diabetes Weight Management/Obesity;Diabetes      Review Daniel Holden reports he doesnt check his BP at home, but has started checking his BS more. Admits it is something he does not like doing and still working on making it a consisitent habit. Daniel Holden is watching his sugar at home and had a bout with prednisone. He is around 150 fasting at the moment. He keeps close watch of his blood sugar. He would like to reduce his weight and reach a weight goal of 185 pounds.      Expected Outcomes STG: Check BS more consistently, take meds as prescribed. LTG: Manage risk factors independently Short: lose a few pounds in the next few weeks. Long: reach weight goal.         Core Components/Risk Factors/Patient Goals at Discharge (Final Review):   Goals and Risk Factor Review - 04/13/24 0927       Core Components/Risk Factors/Patient Goals Review   Personal Goals Review Weight Management/Obesity;Diabetes    Review Carden is watching his sugar at home and had a bout with prednisone. He is around 150 fasting at the moment. He keeps close watch of his blood sugar. He would like to reduce his weight and reach a weight goal of 185 pounds.    Expected Outcomes Short: lose a few pounds in the next few weeks. Long: reach weight goal.          ITP Comments:  ITP Comments     Row Name 02/14/24 1051 02/19/24 1135 02/21/24 0928 03/18/24 1455 04/15/24 1014   ITP Comments Initial phone call completed. Diagnosis can be found in [facility] 7/11. EP Orientation scheduled for Wednesday 8/27 at 9:30. Completed and gym orientation for cardiac rehab. Initial ITP created and sent for review to Dr. Oneil Pinal, Medical Director. First full day of exercise!  Patient was oriented to gym and equipment including functions, settings, policies, and procedures.  Patient's individual exercise prescription and treatment plan were reviewed.  All starting workloads were established based on the results of the 6 minute walk test done at initial orientation visit.  The plan for exercise progression was also introduced and progression will be customized based on patient's performance and goals. 30 Day review completed. Medical Director ITP review done; changes made as directed and signed approval by Medical Director. New to program. 30 Day review completed. Medical Director ITP review done; changes made as directed and signed approval by Medical Director.    Row Name 04/20/24 1545 05/13/24 0938         ITP Comments Daniel Holden called to inform staff that his MD wants him to take a three week break from cardiac rehab to do PT for his back. He will call back in three weeks to give staff update on when he can return. 30 Day review completed. Medical Director ITP review done; changes made as directed and signed approval by Medical Director. Patient has been out since last review.         Comments: 30 day review

## 2024-05-14 DIAGNOSIS — I480 Paroxysmal atrial fibrillation: Secondary | ICD-10-CM

## 2024-05-15 ENCOUNTER — Encounter

## 2024-05-18 ENCOUNTER — Telehealth: Payer: Self-pay

## 2024-05-18 ENCOUNTER — Encounter: Attending: Cardiology

## 2024-05-18 DIAGNOSIS — I213 ST elevation (STEMI) myocardial infarction of unspecified site: Secondary | ICD-10-CM | POA: Insufficient documentation

## 2024-05-18 DIAGNOSIS — Z951 Presence of aortocoronary bypass graft: Secondary | ICD-10-CM | POA: Insufficient documentation

## 2024-05-18 NOTE — Telephone Encounter (Signed)
 Dick was doing PT but has done well with that and wants to finish cardiac rehab now. Plans to come back Wednesday the 26th

## 2024-05-20 ENCOUNTER — Encounter

## 2024-05-20 DIAGNOSIS — Z951 Presence of aortocoronary bypass graft: Secondary | ICD-10-CM

## 2024-05-20 DIAGNOSIS — I213 ST elevation (STEMI) myocardial infarction of unspecified site: Secondary | ICD-10-CM | POA: Diagnosis not present

## 2024-05-20 NOTE — Progress Notes (Signed)
 Daily Session Note  Patient Details  Name: Daniel Holden MRN: [id number] Date of Birth: 05-Jul-1945 Referring Provider:   Flowsheet Row Cardiac Rehab from 02/19/2024 in [facility] Cardiac and Pulmonary Rehab  Referring Provider Dr. Wilbert Bihari, MD    Encounter Date: 05/20/2024  Check In:  Session Check In - 05/20/24 0921       Check-In   Supervising physician immediately available to respond to emergencies See telemetry face sheet for immediately available ER MD    Location [facility]-Cardiac & Pulmonary Rehab    Staff Present Burnard Davenport RN,BSN,MPA;Maxon Conetta BS, Exercise Physiologist;Laura Cates RN,BSN;Margaret Best, MS, Exercise Physiologist    Virtual Visit No    Medication changes reported     No    Fall or balance concerns reported    No    Tobacco Cessation No Change    Warm-up and Cool-down Performed on first and last piece of equipment    Resistance Training Performed Yes    VAD Patient? No    PAD/SET Patient? No      Pain Assessment   Currently in Pain? No/denies             Social History   Tobacco Use  Smoking Status Never  Smokeless Tobacco Never    Goals Met:  Independence with exercise equipment Exercise tolerated well No report of concerns or symptoms today Strength training completed today  Goals Unmet:  Not Applicable  Comments: Pt able to follow exercise prescription today without complaint.  Will continue to monitor for progression.    Dr. Oneil Pinal is Medical Director for [facility] Cardiac Rehabilitation.  Dr. Fuad Aleskerov is Medical Director for [facility] Pulmonary Rehabilitation.

## 2024-05-25 ENCOUNTER — Encounter

## 2024-05-25 DIAGNOSIS — Z951 Presence of aortocoronary bypass graft: Secondary | ICD-10-CM | POA: Insufficient documentation

## 2024-05-25 DIAGNOSIS — I214 Non-ST elevation (NSTEMI) myocardial infarction: Secondary | ICD-10-CM | POA: Insufficient documentation

## 2024-05-25 DIAGNOSIS — I213 ST elevation (STEMI) myocardial infarction of unspecified site: Secondary | ICD-10-CM | POA: Diagnosis present

## 2024-05-25 NOTE — Progress Notes (Signed)
 Daily Session Note  Patient Details  Name: Daniel Holden MRN: [id number] Date of Birth: 04-19-1946 Referring Provider:   Flowsheet Row Cardiac Rehab from 02/19/2024 in [facility] Cardiac and Pulmonary Rehab  Referring Provider Dr. Wilbert Bihari, MD    Encounter Date: 05/25/2024  Check In:  Session Check In - 05/25/24 0917       Check-In   Supervising physician immediately available to respond to emergencies See telemetry face sheet for immediately available ER MD    Location [facility]-Cardiac & Pulmonary Rehab    Staff Present Burnard Davenport RN,BSN,MPA;[facility] RCP,RRT,BSRT;Maxon Burnell BS, Exercise Physiologist;Maize Brittingham Dyane BS, ACSM CEP, Exercise Physiologist    Virtual Visit No    Medication changes reported     No    Fall or balance concerns reported    No    Tobacco Cessation No Change    Warm-up and Cool-down Performed on first and last piece of equipment    Resistance Training Performed Yes    VAD Patient? No    PAD/SET Patient? No      Pain Assessment   Currently in Pain? No/denies             Social History   Tobacco Use  Smoking Status Never  Smokeless Tobacco Never    Goals Met:  Independence with exercise equipment Exercise tolerated well No report of concerns or symptoms today Strength training completed today  Goals Unmet:  Not Applicable  Comments: Pt able to follow exercise prescription today without complaint.  Will continue to monitor for progression.    Dr. Oneil Pinal is Medical Director for [facility] Cardiac Rehabilitation.  Dr. Fuad Aleskerov is Medical Director for [facility] Pulmonary Rehabilitation.

## 2024-05-26 NOTE — Progress Notes (Unsigned)
 Cardiology Clinic Note   Patient Name: Daniel Holden Date of Encounter: 05/29/2024  Primary Care Provider:  Sherial Bail, MD Primary Cardiologist:  Wilbert Bihari, MD  Patient Profile    Daniel Holden 78 year old male presents to the clinic today for follow-up evaluation of his atrial fibrillation, coronary artery disease, and hyperlipidemia.  Past Medical History    Past Medical History:  Diagnosis Date   Allergy    Angina of effort    Anxiety    CAD (coronary artery disease), native coronary artery    s/p cath 12/2023 CTO of RCA, 95% LAD, 90% LCx and echo was normal.  He underwent CABG with LIMA-LAD, SVG-PL and a left radial-OM .   Cancer (HCC)    Carpal tunnel syndrome    GERD (gastroesophageal reflux disease)    HLD (hyperlipidemia)    Hypertension    Lung nodule    Obesity    PAF (paroxysmal atrial fibrillation) (HCC) 12/2023   s/p MAZE and LA clippling   Pancreatitis 2015   Sleep apnea    Past Surgical History:  Procedure Laterality Date   CARPAL TUNNEL RELEASE     CLIPPING OF ATRIAL APPENDAGE N/A 01/06/2024   Procedure: CLIPPING, LEFT ATRIAL APPENDAGE;  Surgeon: Kerrin Elspeth BROCKS, MD;  Location: [facility] OR;  Service: Open Heart Surgery;  Laterality: N/A;   COLONOSCOPY WITH PROPOFOL  N/A 06/27/2021   Procedure: COLONOSCOPY WITH PROPOFOL ;  Surgeon: Maryruth Ole DASEN, MD;  Location: [facility] ENDOSCOPY;  Service: Endoscopy;  Laterality: N/A;   CORONARY ARTERY BYPASS GRAFT N/A 01/06/2024   Procedure: CORONARY ARTERY BYPASS GRAFTING X 3, USING LEFT INTERNAL MAMMARY ARTERY, HARVESTED LEFT RADIAL ARTERY AND ENDOSCOPIC HARVESTED RIGHT GREATER SAPHENOUS VEIN;  Surgeon: Kerrin Elspeth BROCKS, MD;  Location: [facility] OR;  Service: Open Heart Surgery;  Laterality: N/A;   ESOPHAGOGASTRODUODENOSCOPY (EGD) WITH PROPOFOL  N/A 06/27/2021   Procedure: ESOPHAGOGASTRODUODENOSCOPY (EGD) WITH PROPOFOL ;  Surgeon: Maryruth Ole DASEN, MD;  Location: [facility] ENDOSCOPY;  Service: Endoscopy;   Laterality: N/A;   ETHMOIDECTOMY Right 03/20/2021   Procedure: ANTERIOR ETHMOIDECTOMY;  Surgeon: Blair Mt, MD;  Location: [facility] ORS;  Service: ENT;  Laterality: Right;   IMAGE GUIDED SINUS SURGERY N/A 03/20/2021   Procedure: IMAGE GUIDED SINUS SURGERY;  Surgeon: Blair Mt, MD;  Location: [facility] ORS;  Service: ENT;  Laterality: N/A;   INTRAOPERATIVE TRANSESOPHAGEAL ECHOCARDIOGRAM N/A 01/06/2024   Procedure: ECHOCARDIOGRAM, TRANSESOPHAGEAL, INTRAOPERATIVE;  Surgeon: Kerrin Elspeth BROCKS, MD;  Location: [facility] OR;  Service: Open Heart Surgery;  Laterality: N/A;   KNEE ARTHROSCOPY WITH MEDIAL MENISECTOMY Left 06/25/2019   Procedure: KNEE ARTHROSCOPY WITH MEDIAL MENISECTOMY;  Surgeon: Marchia Drivers, MD;  Location: [facility] ORS;  Service: Orthopedics;  Laterality: Left;   LEFT HEART CATH AND CORONARY ANGIOGRAPHY N/A 10/04/2020   Procedure: LEFT HEART CATH AND CORONARY ANGIOGRAPHY;  Surgeon: Anner Alm ORN, MD;  Location: [facility] INVASIVE CV LAB;  Service: Cardiovascular;  Laterality: N/A;   LEFT HEART CATH AND CORONARY ANGIOGRAPHY N/A 01/02/2024   Procedure: LEFT HEART CATH AND CORONARY ANGIOGRAPHY;  Surgeon: Anner Alm ORN, MD;  Location: [facility] INVASIVE CV LAB;  Service: Cardiovascular;  Laterality: N/A;   MAXILLARY ANTROSTOMY Right 03/20/2021   Procedure: MAXILLARY ANTROSTOMY WITH TISSUE REMOVAL;  Surgeon: Blair Mt, MD;  Location: [facility] ORS;  Service: ENT;  Laterality: Right;   MAZE N/A 01/06/2024   Procedure: COX MAZE PROCEDURE;  Surgeon: Kerrin Elspeth BROCKS, MD;  Location: [facility] OR;  Service: Open Heart Surgery;  Laterality: N/A;   RADIAL ARTERY HARVEST  01/06/2024   Procedure: SURGICAL PROCUREMENT, ARTERY, RADIAL;  Surgeon: Kerrin Elspeth BROCKS, MD;  Location: [facility] OR;  Service: Open Heart Surgery;;   SHOULDER SURGERY     TONSILLECTOMY      Allergies  Allergies  Allergen Reactions   Metronidazole  Other (See Comments)    Patient stated that he had a moderate level of pain with his allergic  reaction.    Ace Inhibitors Cough   Dilaudid  [Hydromorphone  Hcl] Nausea And Vomiting    History of Present Illness    Daniel Holden has a PMH of NSTEMI, HTN, HLD, coronary artery disease status post CABG x 3 on 01/06/2024 (LIMA-LAD, SVG-PL, and left radial-OM), OSA, GERD, type 2 diabetes, and atrial fibrillation status post maze and atrial appendage clipping.  His echocardiogram 2023 showed an LVEF of 55-60%, G1 DD, and normal RV function.    He was seen in the cardiology clinic 3/25 and reported worsening shortness of breath.  He was noted to have stable angina.  He presented to [facility] 01/01/2024 with chest pain.  He reported that the pain had been present for 3 days.  He also noted that his pulse had been irregular.  He received aspirin  and nitroglycerin .  He was found to be in atrial fibrillation with rates in the 90s-120s.  He was noted to have elevated cardiac troponins.  He underwent an HD and was noted to have CTO of his RCA, 95% LAD and 90% stenosis of his circumflex.  Echocardiogram at that time showed no significant valvular abnormalities.  He was seen by Dr. Kerrin who recommended CABG.  He underwent CABG x 3 on 01/06/2024.  During that time he had left atrial appendage and maze.  He tolerated surgery well.  He was seen in follow-up by Elaine NP on 02/03/2024.  He remained stable from a cardiac standpoint at that time.  His EKG showed normal sinus rhythm 77 bpm.  He was progressing with his physical activity.  It was felt that he was stable for cardiac rehab.  He presents to the clinic today for follow-up evaluation and states he has done well from a heart standpoint.  His main complaint today is with his low back and his right knee.  He is scheduled to have a viscous knee injection in 2 weeks and is also planning to have lower back injection as well.  He continues to be active in cardiac rehab.  He is somewhat limited in his cardiac rehab activities due to his arthritic pain.  We reviewed  his CABG surgery and appendage clipping as well as his maze procedure.  He denies irregular heartbeats or extra heartbeats.  He is tolerating this medication well.  He requests refill of his atorvastatin .  I will have him continue his physical activity and plan follow-up in 6 months to.  Today he denies chest pain, shortness of breath, lower extremity edema, fatigue, palpitations, melena, hematuria, hemoptysis, diaphoresis, weakness, presyncope, syncope, orthopnea, and PND.   Home Medications    Prior to Admission medications   Medication Sig Start Date End Date Taking? Authorizing Provider  ALPRAZolam  (XANAX ) 0.5 MG tablet Take 0.25 mg by mouth at bedtime as needed for sleep. 12/23/23   [provider]  amLODipine  (NORVASC ) 5 MG tablet TAKE 1 TABLET EVERY DAY 05/01/24   Shlomo Wilbert SAUNDERS, MD  aspirin  81 MG EC tablet Take 81 mg by mouth daily.    [provider]  atorvastatin  (LIPITOR ) 40 MG tablet Take 1 tablet (40 mg total)  by mouth daily. 03/23/24 06/21/24  Shlomo Wilbert SAUNDERS, MD  Cholecalciferol  250 MCG (10000 UT) TABS Take 1,000 Units by mouth daily.    [provider]  clopidogrel  (PLAVIX ) 75 MG tablet Take 1 tablet (75 mg total) by mouth daily. 02/03/24   Campbell, Kenzie E, NP  cyanocobalamin  1000 MCG tablet Take 1,000 mcg by mouth daily.    [provider]  Fe Fum-Vit C-Vit B12-FA (TRIGELS-F FORTE) CAPS capsule Take 1 capsule by mouth daily after breakfast. 01/10/24   Gold, Wayne E, PA-C  fluticasone  (FLONASE ) 50 MCG/ACT nasal spray Place 2 sprays into both nostrils daily.  10/27/13   [provider]  losartan  (COZAAR ) 25 MG tablet Take 1 tablet (25 mg total) by mouth daily. 02/05/24   Shlomo Wilbert SAUNDERS, MD  melatonin 3 MG TABS tablet Take 10 mg by mouth at bedtime.    [provider]  metFORMIN  (GLUCOPHAGE ) 500 MG tablet Take 500 mg by mouth 2 (two) times daily.    [provider]  metoprolol  tartrate (LOPRESSOR ) 25 MG tablet Take 1  tablet (25 mg total) by mouth 2 (two) times daily. Patient taking differently: Take 25 mg by mouth daily. 02/05/24   Shlomo Wilbert SAUNDERS, MD  pantoprazole  (PROTONIX ) 40 MG tablet Take 40 mg by mouth 2 (two) times daily before a meal. 12/26/23 12/25/24  [provider]    Family History    Family History  Problem Relation Age of Onset   CVA Father    He indicated that his mother is deceased. He indicated that his father is deceased. He indicated that all of his three sisters are alive.  Social History    Social History   Socioeconomic History   Marital status: Married    Spouse name: Not on file   Number of children: Not on file   Years of education: Not on file   Highest education level: Not on file  Occupational History   Not on file  Tobacco Use   Smoking status: Never   Smokeless tobacco: Never  Vaping Use   Vaping status: Never Used  Substance and Sexual Activity   Alcohol use: Yes    Alcohol/week: 42.0 standard drinks of alcohol    Types: 42 Cans of beer per week    Comment: 6 per day   Drug use: No   Sexual activity: Not on file  Other Topics Concern   Not on file  Social History Narrative   Not on file   Social Drivers of Health   Financial Resource Strain: Low Risk  (12/23/2023)   Received from [facility]   Overall Financial Resource Strain (CARDIA)    Difficulty of Paying Living Expenses: Not hard at all  Food Insecurity: No Food Insecurity (01/03/2024)   Hunger Vital Sign    Worried About Running Out of Food in the Last Year: Never true    Ran Out of Food in the Last Year: Never true  Transportation Needs: No Transportation Needs (01/03/2024)   PRAPARE - Administrator, Civil Service (Medical): No    Lack of Transportation (Non-Medical): No  Physical Activity: Not on file  Stress: Not on file  Social Connections: Moderately Integrated (01/03/2024)   Social Connection and Isolation Panel    Frequency of Communication with  Friends and Family: Three times a week    Frequency of Social Gatherings with Friends and Family: Three times a week    Attends Religious Services: 1 to 4  times per year    Active Member of Clubs or Organizations: No    Attends Banker Meetings: Never    Marital Status: Married  Catering Manager Violence: Not At Risk (01/03/2024)   Humiliation, Afraid, Rape, and Kick questionnaire    Fear of Current or Ex-Partner: No    Emotionally Abused: No    Physically Abused: No    Sexually Abused: No     Review of Systems    General:  No chills, fever, night sweats or weight changes.  Cardiovascular:  No chest pain, dyspnea on exertion, edema, orthopnea, palpitations, paroxysmal nocturnal dyspnea. Dermatological: No rash, lesions/masses Respiratory: No cough, dyspnea Urologic: No hematuria, dysuria Abdominal:   No nausea, vomiting, diarrhea, bright red blood per rectum, melena, or hematemesis Neurologic:  No visual changes, wkns, changes in mental status. All other systems reviewed and are otherwise negative except as noted above.  Physical Exam    VS:  BP 122/60   Pulse 81   Ht 5' 6 (1.676 m)   Wt 212 lb (96.2 kg)   SpO2 98%   BMI 34.22 kg/m  , BMI Body mass index is 34.22 kg/m. GEN: Well nourished, well developed, in no acute distress. HEENT: normal. Neck: Supple, no JVD, carotid bruits, or masses. Cardiac: RRR, no murmurs, rubs, or gallops. No clubbing, cyanosis, edema.  Radials/DP/PT 2+ and equal bilaterally.  Respiratory:  Respirations regular and unlabored, clear to auscultation bilaterally. GI: Soft, nontender, nondistended, BS + x 4. MS: no deformity or atrophy. Skin: warm and dry, no rash. Neuro:  Strength and sensation are intact. Psych: Normal affect.  Accessory Clinical Findings    Recent Labs: 01/07/2024: Magnesium  2.1 02/03/2024: BUN 9; Creatinine, Ser 0.78; Potassium 4.6; Sodium 135 02/28/2024: ALT 6; Hemoglobin 12.3; Platelets 208   Recent Lipid  Panel    Component Value Date/Time   CHOL 119 01/02/2024 0426   CHOL 104 11/07/2020 0741   CHOL 83 09/27/2013 0319   TRIG 132 01/02/2024 0426   TRIG 188 09/27/2013 0319   HDL 60 01/02/2024 0426   HDL 58 11/07/2020 0741   HDL 28 (L) 09/27/2013 0319   CHOLHDL 2.0 01/02/2024 0426   VLDL 26 01/02/2024 0426   VLDL 38 09/27/2013 0319   LDLCALC 33 01/02/2024 0426   LDLCALC 26 11/07/2020 0741   LDLCALC 17 09/27/2013 0319         ECG personally reviewed by me today-none today.    Echocardiogram 01/03/2024  IMPRESSIONS     1. Left ventricular ejection fraction, by estimation, is 60 to 65%. The  left ventricle has normal function. The left ventricle has no regional  wall motion abnormalities. There is mild left ventricular hypertrophy.  Left ventricular diastolic parameters  are consistent with Grade I diastolic dysfunction (impaired relaxation).   2. Right ventricular systolic function is normal. The right ventricular  size is normal.   3. The mitral valve is normal in structure. No evidence of mitral valve  regurgitation.   4. The aortic valve was not well visualized. Aortic valve regurgitation  is not visualized.   5. The inferior vena cava is normal in size with greater than 50%  respiratory variability, suggesting right atrial pressure of 3 mmHg.   FINDINGS   Left Ventricle: Left ventricular ejection fraction, by estimation, is 60  to 65%. The left ventricle has normal function. The left ventricle has no  regional wall motion abnormalities. The left ventricular internal cavity  size was normal in size. There is  mild left ventricular hypertrophy. Left ventricular diastolic parameters  are consistent with Grade I diastolic dysfunction (impaired relaxation).   Right Ventricle: The right ventricular size is normal. No increase in  right ventricular wall thickness. Right ventricular systolic function is  normal.   Left Atrium: Left atrial size was normal in size.    Right Atrium: Right atrial size was normal in size.   Pericardium: There is no evidence of pericardial effusion.   Mitral Valve: The mitral valve is normal in structure. No evidence of  mitral valve regurgitation.   Tricuspid Valve: The tricuspid valve is normal in structure. Tricuspid  valve regurgitation is mild.   Aortic Valve: The aortic valve was not well visualized. Aortic valve  regurgitation is not visualized.   Pulmonic Valve: The pulmonic valve was not well visualized. Pulmonic valve  regurgitation is not visualized.   Aorta: The aortic root and ascending aorta are structurally normal, with  no evidence of dilitation.   Venous: The inferior vena cava is normal in size with greater than 50%  respiratory variability, suggesting right atrial pressure of 3 mmHg.       Assessment & Plan   1.  Coronary artery disease-status post CABG x 3 on 01/06/2024.  Continues cardiac rehab but is somewhat limited in activity due to arthritic joint pain and low back pain.  Completed cardiac rehab.  Does note occasional pinching type sensation to the left of his sternal incision.  Heart healthy low-sodium diet Maintain physical activity Continue amlodipine , aspirin , atorvastatin , Plavix , metoprolol   Hyperlipidemia-LDL 33 on 01/02/2024. High-fiber diet Maintain physical activity Continue aspirin , atorvastatin , Plavix   Paroxysmal atrial fibrillation-heart rate today 81 bpm.  Status post maze and left atrial appendage clipping.  He was noted to have only brief episode of atrial fibrillation during NSTEMI.  Reports compliance with CPAP. Avoid triggers caffeine, chocolate, EtOH, dehydration excetra. Continue aspirin , Plavix , metoprolol   Essential hypertension-BP today 122/60. Heart healthy low-sodium diet Maintain blood pressure log Continue metoprolol , losartan , amlodipine   Type 2 diabetes-glucose 126 on 02/03/2024. Carb modified diet Maintain physical activity Continue  metformin  Follows with PCP  Disposition: Follow-up with Dr. Shlomo or me in 6 months.   Josefa HERO. Erol Flanagin NP-C     05/29/2024, 9:41 AM [facility] [address] [place], [place] [address] Office [phone]    Notice: This dictation was prepared with Dragon dictation along with smaller phrase technology. Any transcriptional errors that result from this process are unintentional and may not be corrected upon review.   I spent 14 minutes examining this patient, reviewing medications, and using patient centered shared decision making involving their cardiac care.   I spent  20 minutes reviewing past medical history,  medications, and prior cardiac tests.

## 2024-05-27 ENCOUNTER — Encounter

## 2024-05-27 DIAGNOSIS — Z951 Presence of aortocoronary bypass graft: Secondary | ICD-10-CM | POA: Diagnosis not present

## 2024-05-27 DIAGNOSIS — I213 ST elevation (STEMI) myocardial infarction of unspecified site: Secondary | ICD-10-CM

## 2024-05-27 NOTE — Progress Notes (Signed)
 Daily Session Note  Patient Details  Name: ANTWOINE ZORN MRN: [id number] Date of Birth: 1945-11-02 Referring Provider:   Flowsheet Row Cardiac Rehab from 02/19/2024 in [facility] Cardiac and Pulmonary Rehab  Referring Provider Dr. Wilbert Bihari, MD    Encounter Date: 05/27/2024  Check In:  Session Check In - 05/27/24 0946       Check-In   Supervising physician immediately available to respond to emergencies See telemetry face sheet for immediately available ER MD    Location [facility]-Cardiac & Pulmonary Rehab    Staff Present Leita Franks RN,BSN;Joseph [facility] BS, Exercise Physiologist;Denise Rhew PhD, RN,CNS,CEN    Virtual Visit No    Medication changes reported     No    Fall or balance concerns reported    No    Tobacco Cessation No Change    Warm-up and Cool-down Performed on first and last piece of equipment    Resistance Training Performed Yes    VAD Patient? No    PAD/SET Patient? No      Pain Assessment   Currently in Pain? No/denies             Social History   Tobacco Use  Smoking Status Never  Smokeless Tobacco Never    Goals Met:  Independence with exercise equipment Exercise tolerated well No report of concerns or symptoms today Strength training completed today  Goals Unmet:  Not Applicable  Comments: Pt able to follow exercise prescription today without complaint.  Will continue to monitor for progression.    Dr. Oneil Pinal is Medical Director for [facility] Cardiac Rehabilitation.  Dr. Fuad Aleskerov is Medical Director for [facility] Pulmonary Rehabilitation.

## 2024-05-28 NOTE — Telephone Encounter (Signed)
 Call to patient per Dr. Shlomo to check on formulation of metoprolol  he is currently using. Patient verifies he is on 25 mg metoprolol  tartrate BID as documented on med list. Patient responses forwarded to Dr. Shlomo.

## 2024-05-28 NOTE — Telephone Encounter (Signed)
-----   Message from Daniel Holden sent at 05/22/2024  8:48 PM EST ----- Please verify if he is taking Metoprolol  tartrate or succinate ----- Message ----- From: Janit Geni CROME, RN Sent: 05/20/2024  12:50 PM EST To: Daniel JONELLE Bihari, MD  Patient denies any palpitations and says he is feeling great.  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 Maybe should have the heart monitor before my heart attack on 01/01/24. Appreciate you! Thanks, Daniel Holden ----- Message ----- From: Holden Daniel JONELLE, MD Sent: 05/14/2024   3:43 PM EST To: Geni CROME Janit, RN  Heart monitor showed 1 episode of NSVT lasting 4 beats. No afib.  Please let us  know if he has any further palpitations ----- Message ----- From: Holden Daniel JONELLE, MD Sent: 05/14/2024   6:37 AM EST To: Daniel JONELLE Bihari, MD

## 2024-05-29 ENCOUNTER — Encounter

## 2024-05-29 ENCOUNTER — Telehealth (HOSPITAL_BASED_OUTPATIENT_CLINIC_OR_DEPARTMENT_OTHER): Payer: Self-pay

## 2024-05-29 ENCOUNTER — Encounter: Payer: Self-pay | Admitting: General Practice

## 2024-05-29 ENCOUNTER — Ambulatory Visit: Attending: General Practice | Admitting: General Practice

## 2024-05-29 VITALS — BP 122/60 | HR 81 | Ht 66.0 in | Wt 212.0 lb

## 2024-05-29 DIAGNOSIS — E7800 Pure hypercholesterolemia, unspecified: Secondary | ICD-10-CM | POA: Diagnosis not present

## 2024-05-29 DIAGNOSIS — I480 Paroxysmal atrial fibrillation: Secondary | ICD-10-CM | POA: Diagnosis not present

## 2024-05-29 DIAGNOSIS — I10 Essential (primary) hypertension: Secondary | ICD-10-CM

## 2024-05-29 DIAGNOSIS — E119 Type 2 diabetes mellitus without complications: Secondary | ICD-10-CM | POA: Diagnosis not present

## 2024-05-29 DIAGNOSIS — I2510 Atherosclerotic heart disease of native coronary artery without angina pectoris: Secondary | ICD-10-CM

## 2024-05-29 MED ORDER — ATORVASTATIN CALCIUM 40 MG PO TABS: 40.0000 mg | ORAL_TABLET | Freq: Every day | ORAL | 3 refills | Status: AC

## 2024-05-29 NOTE — Patient Instructions (Signed)
 Medication Instructions:  Your physician recommends that you continue on your current medications as directed. Please refer to the Current Medication list given to you today.  *If you need a refill on your cardiac medications before your next appointment, please call your pharmacy*   Follow-Up: At [facility], you and your health needs are our priority.  As part of our continuing mission to provide you with exceptional heart care, our providers are all part of one team.  This team includes your primary Cardiologist (physician) and Advanced Practice Providers or APPs (Physician Assistants and Nurse Practitioners) who all work together to provide you with the care you need, when you need it.  Your next appointment:   6 month(s)  Provider:   Wilbert Bihari, MD or [name], NP

## 2024-05-29 NOTE — Telephone Encounter (Signed)
   Patient Name: Daniel Holden  DOB: February 23, 1946 MRN: [id number]  Primary Cardiologist: Wilbert Bihari, MD  Chart reviewed as part of pre-operative protocol coverage. Per [name], NP, Yes, he may proceed with planned procedure. His aspirin  may be held for for 7 days prior to his procedure. His Plavix  may be held for 5 to 7 days prior to his procedure. No further cardiac testing is needed at this time. Thank you.  Will route this bundled recommendation to requesting provider via Epic fax function. Please call with questions.   Paxson Harrower N Micaylah Bertucci, PA-C 05/29/2024, 4:15 PM

## 2024-05-29 NOTE — Telephone Encounter (Signed)
   Pre-operative Risk Assessment    Patient Name: Daniel Holden  DOB: 09-06-1945 MRN: [id number]   Date of last office visit: 05/29/24 with Emelia Date of next office visit: NA  Request for Surgical Clearance    Procedure:  injection  Date of Surgery:  Clearance TBD                                 Surgeon:  NA Surgeon's Group or Practice Name:  [facility]  Phone number:  [phone] Fax number:  [phone]   Type of Clearance Requested:   - Medical  - Pharmacy:  Hold Aspirin  not indicated. Plavix  for 7 days prior   Type of Anesthesia:  Not Indicated   Additional requests/questions:    Signed, Augustin JONETTA Daring   05/29/2024, 3:50 PM

## 2024-05-29 NOTE — Telephone Encounter (Signed)
 Patient seen by [name], NP today. Note outlines plan for upcoming injections. Will route to [place] for input on clearance for these and holding of antiplatelets.

## 2024-06-01 ENCOUNTER — Encounter

## 2024-06-01 DIAGNOSIS — Z951 Presence of aortocoronary bypass graft: Secondary | ICD-10-CM | POA: Diagnosis not present

## 2024-06-01 DIAGNOSIS — I213 ST elevation (STEMI) myocardial infarction of unspecified site: Secondary | ICD-10-CM

## 2024-06-01 NOTE — Progress Notes (Signed)
 Daily Session Note  Patient Details  Name: Daniel Holden MRN: [id number] Date of Birth: 17-Apr-1946 Referring Provider:   Flowsheet Row Cardiac Rehab from 02/19/2024 in [facility] Cardiac and Pulmonary Rehab  Referring Provider Dr. Wilbert Bihari, MD    Encounter Date: 06/01/2024  Check In:  Session Check In - 06/01/24 0902       Check-In   Supervising physician immediately available to respond to emergencies See telemetry face sheet for immediately available ER MD    Location [facility]-Cardiac & Pulmonary Rehab    Staff Present Burnard Davenport RN,BSN,MPA;Maxon Burnell BS, Exercise Physiologist;Joseph [facility] BS, ACSM CEP, Exercise Physiologist    Virtual Visit No    Medication changes reported     No    Fall or balance concerns reported    No    Tobacco Cessation No Change    Warm-up and Cool-down Performed on first and last piece of equipment    Resistance Training Performed Yes    VAD Patient? No    PAD/SET Patient? No      Pain Assessment   Currently in Pain? No/denies             Social History   Tobacco Use  Smoking Status Never  Smokeless Tobacco Never    Goals Met:  Independence with exercise equipment Exercise tolerated well No report of concerns or symptoms today Strength training completed today  Goals Unmet:  Not Applicable  Comments: Pt able to follow exercise prescription today without complaint.  Will continue to monitor for progression.    Dr. Oneil Pinal is Medical Director for [facility] Cardiac Rehabilitation.  Dr. Fuad Aleskerov is Medical Director for [facility] Pulmonary Rehabilitation.

## 2024-06-03 ENCOUNTER — Encounter

## 2024-06-03 DIAGNOSIS — Z951 Presence of aortocoronary bypass graft: Secondary | ICD-10-CM

## 2024-06-03 DIAGNOSIS — I213 ST elevation (STEMI) myocardial infarction of unspecified site: Secondary | ICD-10-CM

## 2024-06-03 NOTE — Telephone Encounter (Signed)
-----   Message from Wilbert Bihari sent at 05/30/2024 12:15 PM EST ----- Heart monitor showed no evidence of recurrent A-fib.  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  Discussed with EP and recommend going on DOAC with Eliquis 5mg  BID and stop ASA and continue Plavix  ----- Message ----- From: Janit Geni CROME, RN Sent: 05/28/2024   5:30 PM EST To: Wilbert JONELLE Bihari, MD  ----- Message from Geni CROME Janit, RN sent at 05/28/2024  5:30 PM EST -----  Patient responses. ----- Message ----- From: Bihari Wilbert JONELLE, MD Sent: 05/22/2024   8:48 PM EST To: Geni CROME Janit, RN  Please verify if he is taking Metoprolol  tartrate or succinate ----- Message ----- From: Janit Geni CROME, RN Sent: 05/20/2024  12:50 PM EST To: Wilbert JONELLE Bihari, MD  Patient denies any palpitations and says he is feeling great.  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 Maybe should have the heart monitor before my heart attack on 01/01/24. Appreciate you! Thanks, Dick ----- Message ----- From: Bihari Wilbert JONELLE, MD Sent: 05/14/2024   3:43 PM EST To: Geni CROME Janit, RN  Heart monitor showed 1 episode of NSVT lasting 4 beats. No afib.  Please let us  know if he has any further palpitations ----- Message ----- From: Bihari Wilbert JONELLE, MD Sent: 05/14/2024   6:37 AM EST To: Wilbert JONELLE Bihari, MD

## 2024-06-03 NOTE — Telephone Encounter (Signed)
 Call to patient to advise that  Heart monitor showed no evidence of recurrent A-fib.  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  Discussed with EP and recommend going on DOAC with Eliquis 5mg  BID and stop ASA and continue Plavix     Patient states he is willing to make this change, but he has spine injections next Friday and he has already gone through the preop clearance process in regards to getting recommendations for holding his aspirin /plavix . He is asking if he can change the aspirin  to eliquis after his procedure since he is currently having to hold his plavix  prior to his procedure for several days.

## 2024-06-03 NOTE — Progress Notes (Signed)
 Daily Session Note  Patient Details  Name: Daniel Holden MRN: [id number] Date of Birth: 1945-08-26 Referring Provider:   Flowsheet Row Cardiac Rehab from 02/19/2024 in [facility] Cardiac and Pulmonary Rehab  Referring Provider Dr. Wilbert Bihari, MD    Encounter Date: 06/03/2024  Check In:  Session Check In - 06/03/24 0929       Check-In   Supervising physician immediately available to respond to emergencies See telemetry face sheet for immediately available ER MD    Location [facility]-Cardiac & Pulmonary Rehab    Staff Present Burnard Davenport RN,BSN,MPA;Maxon Conetta BS, Exercise Physiologist;Joseph Rolinda RCP,RRT,BSRT;Laura Cates RN,BSN    Virtual Visit No    Medication changes reported     No    Fall or balance concerns reported    No    Tobacco Cessation No Change    Warm-up and Cool-down Performed on first and last piece of equipment    Resistance Training Performed Yes    VAD Patient? No    PAD/SET Patient? No      Pain Assessment   Currently in Pain? No/denies             Social History   Tobacco Use  Smoking Status Never  Smokeless Tobacco Never    Goals Met:  Independence with exercise equipment Exercise tolerated well No report of concerns or symptoms today Strength training completed today  Goals Unmet:  Not Applicable  Comments: Pt able to follow exercise prescription today without complaint.  Will continue to monitor for progression.    Dr. Oneil Pinal is Medical Director for [facility] Cardiac Rehabilitation.  Dr. Fuad Aleskerov is Medical Director for [facility] Pulmonary Rehabilitation.

## 2024-06-04 ENCOUNTER — Telehealth: Payer: Self-pay | Admitting: Cardiology

## 2024-06-04 MED ORDER — APIXABAN 5 MG PO TABS: 5.0000 mg | ORAL_TABLET | Freq: Two times a day (BID) | ORAL | 3 refills | Status: DC

## 2024-06-04 NOTE — Telephone Encounter (Signed)
 Patient states the reasoning for starting Eliquis was not clear to him from yesterday's conversation with Dr. Dorine nurse. He states he is OK with starting Eliquis but would like to know what the reasoning was for this decision.  Shared message from Dr. Shlomo with patient from heart monitor results:  Daniel Holden Shlomo, MD 05/30/2024 12:15 PM EST     Heart monitor showed no evidence of recurrent A-fib.  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 Despite having a Maze and atrial appendage clipping, he is still at increase risk of cardioembolic events if he has further afib.  Discussed with EP and recommend going on DOAC with Eliquis 5mg  BID and stop ASA and continue Plavix     Eliquis 5 mg BID send to [facility]. Scheduled 6 month F/U appt with Josefa for 11/19/24.   All questions answered. Patient verbalized understanding of the above and expressed appreciation for call and explanation.

## 2024-06-04 NOTE — Telephone Encounter (Signed)
 Pt requesting an explanation as to why he needs to start Eliquis if his heart monitor results were okay. Please advise.

## 2024-06-05 ENCOUNTER — Encounter

## 2024-06-06 ENCOUNTER — Encounter: Payer: Self-pay | Admitting: Cardiology

## 2024-06-08 ENCOUNTER — Other Ambulatory Visit (HOSPITAL_COMMUNITY): Payer: Self-pay

## 2024-06-08 ENCOUNTER — Encounter

## 2024-06-08 ENCOUNTER — Telehealth: Payer: Self-pay | Admitting: Pharmacy Technician

## 2024-06-08 DIAGNOSIS — Z951 Presence of aortocoronary bypass graft: Secondary | ICD-10-CM | POA: Diagnosis not present

## 2024-06-08 DIAGNOSIS — M1711 Unilateral primary osteoarthritis, right knee: Secondary | ICD-10-CM | POA: Diagnosis not present

## 2024-06-08 DIAGNOSIS — I213 ST elevation (STEMI) myocardial infarction of unspecified site: Secondary | ICD-10-CM

## 2024-06-08 MED ORDER — APIXABAN 5 MG PO TABS
5.0000 mg | ORAL_TABLET | Freq: Two times a day (BID) | ORAL | 0 refills | Status: DC
  Filled 2024-06-08 – 2024-06-09 (×2): qty 60, 30d supply, fill #0

## 2024-06-08 NOTE — Telephone Encounter (Signed)
 Eliquis patient assistance application mailed to patient

## 2024-06-08 NOTE — Progress Notes (Signed)
 Daily Session Note  Patient Details  Name: Daniel Holden MRN: [id number] Date of Birth: 20-May-1946 Referring Provider:   Flowsheet Row Cardiac Rehab from 02/19/2024 in [facility] Cardiac and Pulmonary Rehab  Referring Provider Dr. Wilbert Bihari, MD    Encounter Date: 06/08/2024  Check In:  Session Check In - 06/08/24 0937       Check-In   Supervising physician immediately available to respond to emergencies See telemetry face sheet for immediately available ER MD    Location [facility]-Cardiac & Pulmonary Rehab    Staff Present Burnard Davenport RN,BSN,MPA;Joseph [facility] RCP,RRT,BSRT;Maxon Burnell BS, Exercise Physiologist;Vernis [place] Dyane BS, ACSM CEP, Exercise Physiologist    Virtual Visit No    Medication changes reported     No    Fall or balance concerns reported    No    Tobacco Cessation No Change    Warm-up and Cool-down Performed on first and last piece of equipment    Resistance Training Performed Yes    VAD Patient? No    PAD/SET Patient? No      Pain Assessment   Currently in Pain? No/denies             Tobacco Use History[1]  Goals Met:  Independence with exercise equipment Exercise tolerated well No report of concerns or symptoms today Strength training completed today  Goals Unmet:  Not Applicable  Comments: Pt able to follow exercise prescription today without complaint.  Will continue to monitor for progression.    Dr. Oneil Pinal is Medical Director for [facility] Cardiac Rehabilitation.  Dr. Fuad Aleskerov is Medical Director for [facility] Rehabilitation.    [1]  Social History Tobacco Use  Smoking Status Never  Smokeless Tobacco Never

## 2024-06-09 ENCOUNTER — Other Ambulatory Visit (HOSPITAL_COMMUNITY): Payer: Self-pay

## 2024-06-09 ENCOUNTER — Other Ambulatory Visit: Payer: Self-pay

## 2024-06-09 DIAGNOSIS — L570 Actinic keratosis: Secondary | ICD-10-CM | POA: Diagnosis not present

## 2024-06-09 DIAGNOSIS — L2989 Other pruritus: Secondary | ICD-10-CM | POA: Diagnosis not present

## 2024-06-09 DIAGNOSIS — D2262 Melanocytic nevi of left upper limb, including shoulder: Secondary | ICD-10-CM | POA: Diagnosis not present

## 2024-06-09 DIAGNOSIS — D225 Melanocytic nevi of trunk: Secondary | ICD-10-CM | POA: Diagnosis not present

## 2024-06-09 DIAGNOSIS — D485 Neoplasm of uncertain behavior of skin: Secondary | ICD-10-CM | POA: Diagnosis not present

## 2024-06-09 DIAGNOSIS — L538 Other specified erythematous conditions: Secondary | ICD-10-CM | POA: Diagnosis not present

## 2024-06-09 DIAGNOSIS — L820 Inflamed seborrheic keratosis: Secondary | ICD-10-CM | POA: Diagnosis not present

## 2024-06-09 DIAGNOSIS — D2261 Melanocytic nevi of right upper limb, including shoulder: Secondary | ICD-10-CM | POA: Diagnosis not present

## 2024-06-09 DIAGNOSIS — Z85828 Personal history of other malignant neoplasm of skin: Secondary | ICD-10-CM | POA: Diagnosis not present

## 2024-06-09 DIAGNOSIS — D2272 Melanocytic nevi of left lower limb, including hip: Secondary | ICD-10-CM | POA: Diagnosis not present

## 2024-06-10 ENCOUNTER — Encounter

## 2024-06-10 DIAGNOSIS — I213 ST elevation (STEMI) myocardial infarction of unspecified site: Secondary | ICD-10-CM

## 2024-06-10 DIAGNOSIS — Z951 Presence of aortocoronary bypass graft: Secondary | ICD-10-CM | POA: Diagnosis not present

## 2024-06-10 NOTE — Progress Notes (Signed)
 Daily Session Note  Patient Details  Name: Daniel Holden MRN: [id number] Date of Birth: [birth year] Referring Provider:   Flowsheet Row Cardiac Rehab from 02/19/2024 in [facility] Cardiac and Pulmonary Rehab  Referring Provider Dr. Wilbert Bihari, MD    Encounter Date: 06/10/2024  Check In:  Session Check In - 06/10/24 0904       Check-In   Supervising physician immediately available to respond to emergencies See telemetry face sheet for immediately available ER MD    Location [facility]-Cardiac & Pulmonary Rehab    Staff Present Burnard Davenport RN,BSN,MPA;Joseph [facility] RN,BSN;Margaret Best, MS, Exercise Physiologist    Virtual Visit No    Medication changes reported     No    Fall or balance concerns reported    No    Tobacco Cessation No Change    Warm-up and Cool-down Performed on first and last piece of equipment    Resistance Training Performed Yes    VAD Patient? No    PAD/SET Patient? No      Pain Assessment   Currently in Pain? No/denies             Tobacco Use History[1]  Goals Met:  Independence with exercise equipment Exercise tolerated well No report of concerns or symptoms today Strength training completed today  Goals Unmet:  Not Applicable  Comments: Pt able to follow exercise prescription today without complaint.  Will continue to monitor for progression.    Dr. Oneil Pinal is Medical Director for [facility] Cardiac Rehabilitation.  Dr. Fuad Aleskerov is Medical Director for [facility] Pulmonary Rehabilitation.    [1]  Social History Tobacco Use  Smoking Status Never  Smokeless Tobacco Never

## 2024-06-10 NOTE — Progress Notes (Signed)
 Cardiac Individual Treatment Plan  Patient Details  Name: OREY MOURE MRN: [id number] Date of Birth: 10-Aug-1945 Referring Provider:   Flowsheet Row Cardiac Rehab from 02/19/2024 in [facility] Cardiac and Pulmonary Rehab  Referring Provider Dr. Wilbert Bihari, MD    Initial Encounter Date:  Flowsheet Row Cardiac Rehab from 02/19/2024 in [facility] Cardiac and Pulmonary Rehab  Date 02/19/24    Visit Diagnosis: S/P CABG x 3  Patient's Home Medications on Admission: Current Medications[1]  Past Medical History: Past Medical History:  Diagnosis Date   Allergy    Angina of effort    Anxiety    CAD (coronary artery disease), native coronary artery    s/p cath 12/2023 CTO of RCA, 95% LAD, 90% LCx and echo was normal.  He underwent CABG with LIMA-LAD, SVG-PL and a left radial-OM .   Cancer [facility])    Carpal tunnel syndrome    GERD (gastroesophageal reflux disease)    HLD (hyperlipidemia)    Hypertension    Lung nodule    Obesity    PAF (paroxysmal atrial fibrillation) (HCC) 12/2023   s/p MAZE and LA clippling   Pancreatitis 2015   Sleep apnea     Tobacco Use: Tobacco Use History[2]  Labs: Review Flowsheet  More data exists      Latest Ref Rng & Units 11/07/2020 03/19/2021 01/02/2024 01/05/2024 01/06/2024  Labs for ITP Cardiac and Pulmonary Rehab  Cholestrol 0 - 200 mg/dL 895  - 880  - -  LDL (calc) 0 - 99 mg/dL 26  - 33  - -  HDL-C >59 mg/dL 58  - 60  - -  Trlycerides <150 mg/dL 888  - 867  - -  Hemoglobin A1c 4.8 - 5.6 % - 6.6  6.1  - -  PH, Arterial 7.35 - 7.45 - - - 7.44  7.410  7.362  7.347  7.372  7.425  7.347  7.319   PCO2 arterial 32 - 48 mmHg - - - 31  32.1  33.2  38.9  37.6  35.7  34.9  41.1   Bicarbonate 20.0 - 28.0 mmol/L - - - 21.1  20.3  18.9  21.7  21.8  23.4  19.1  21.1   TCO2 22 - 32 mmol/L - - - - 21  20  23  22  23  25  24  24  20  22  21  22    Acid-base deficit 0.0 - 2.0 mmol/L - - - 2.1  4.0  6.0  4.0  3.0  1.0  6.0  5.0   O2 Saturation % - - - 99.4  98  99  97  99   100  100  100     Details       Multiple values from one day are sorted in reverse-chronological order          Exercise Target Goals: Exercise Program Goal: Individual exercise prescription set using results from initial 6 min walk test and THRR while considering  patients activity barriers and safety.   Exercise Prescription Goal: Initial exercise prescription builds to 30-45 minutes a day of aerobic activity, 2-3 days per week.  Home exercise guidelines will be given to patient during program as part of exercise prescription that the participant will acknowledge.   Education: Aerobic Exercise: - Group verbal and visual presentation on the components of exercise prescription. Introduces F.I.T.T principle from ACSM for exercise prescriptions.  Reviews F.I.T.T. principles of aerobic exercise including progression. Written material provided  at class time. Flowsheet Row Cardiac Rehab from 05/24/2021 in [facility] Cardiac and Pulmonary Rehab  Education need identified 03/09/21    Education: Resistance Exercise: - Group verbal and visual presentation on the components of exercise prescription. Introduces F.I.T.T principle from ACSM for exercise prescriptions  Reviews F.I.T.T. principles of resistance exercise including progression. Written material provided at class time. Flowsheet Row Cardiac Rehab from 05/24/2021 in [facility] Cardiac and Pulmonary Rehab  Date 04/26/21  Educator [employer]  Instruction Review Code 1- Verbalizes Understanding     Education: Exercise & Equipment Safety: - Individual verbal instruction and demonstration of equipment use and safety with use of the equipment. Flowsheet Row Cardiac Rehab from 03/11/2024 in [facility] Cardiac and Pulmonary Rehab  Date 02/19/24  Educator NT  Instruction Review Code 1- Verbalizes Understanding    Education: Exercise Physiology & General Exercise Guidelines: - Group verbal and written instruction with models to review the exercise physiology of  the cardiovascular system and associated critical values. Provides general exercise guidelines with specific guidelines to those with heart or lung disease. Written material provided at class time. Flowsheet Row Cardiac Rehab from 05/24/2021 in [facility] Cardiac and Pulmonary Rehab  Date 04/12/21  Educator [facility]  Instruction Review Code 1- Verbalizes Understanding    Education: Flexibility, Balance, Mind/Body Relaxation: - Group verbal and visual presentation with interactive activity on the components of exercise prescription. Introduces F.I.T.T principle from ACSM for exercise prescriptions. Reviews F.I.T.T. principles of flexibility and balance exercise training including progression. Also discusses the mind body connection.  Reviews various relaxation techniques to help reduce and manage stress (i.e. Deep breathing, progressive muscle relaxation, and visualization). Balance handout provided to take home. Written material provided at class time. Flowsheet Row Cardiac Rehab from 05/24/2021 in [facility] Cardiac and Pulmonary Rehab  Date 05/03/21  Educator AS  Instruction Review Code 1- Verbalizes Understanding    Activity Barriers & Risk Stratification:  Activity Barriers & Cardiac Risk Stratification - 02/14/24 1033       Activity Barriers & Cardiac Risk Stratification   Activity Barriers Left Knee Replacement;Joint Problems;Back Problems    Cardiac Risk Stratification High          6 Minute Walk:  6 Minute Walk     Row Name 02/19/24 1143         6 Minute Walk   Phase Initial     Distance 1195 feet     Walk Time 6 minutes     # of Rest Breaks 0     MPH 2.26     METS 2.26     RPE 10     Perceived Dyspnea  1     VO2 Peak 7.92     Symptoms No     Resting HR 76 bpm     Resting BP 142/68     Resting Oxygen Saturation  96 %     Exercise Oxygen Saturation  during 6 min walk 96 %     Max Ex. HR 96 bpm     Max Ex. BP 160/72     2 Minute Post BP 154/72        Oxygen Initial  Assessment:   Oxygen Re-Evaluation:   Oxygen Discharge (Final Oxygen Re-Evaluation):   Initial Exercise Prescription:  Initial Exercise Prescription - 02/19/24 1100       Date of Initial Exercise RX and Referring Provider   Date 02/19/24    Referring Provider Dr. Wilbert Bihari, MD      Oxygen   Maintain  Oxygen Saturation 88% or higher      Treadmill   MPH 2.2    Grade 0    Minutes 15    METs 2.68      REL-XR   Level 3    Speed 50    Minutes 15    METs 2.26      Biostep-RELP   Level 2    SPM 50    Minutes 15    METs 2.26      Track   Laps 26    Minutes 15    METs 2.41      Prescription Details   Frequency (times per week) 3    Duration Progress to 30 minutes of continuous aerobic without signs/symptoms of physical distress      Intensity   THRR 40-80% of Max Heartrate 103-131    Ratings of Perceived Exertion 11-13    Perceived Dyspnea 0-4      Progression   Progression Continue to progress workloads to maintain intensity without signs/symptoms of physical distress.      Resistance Training   Training Prescription Yes    Weight 5 lb    Reps 10-15          Perform Capillary Blood Glucose checks as needed.  Exercise Prescription Changes:   Exercise Prescription Changes     Row Name 02/19/24 1100 03/03/24 0800 03/19/24 1600 03/31/24 1400 04/15/24 0900     Response to Exercise   Blood Pressure (Admit) 142/68 114/60 124/80 106/60 --   Blood Pressure (Exercise) 160/72 158/70 162/70 152/60 --   Blood Pressure (Exit) 154/72 118/62 124/70 122/62 --   Heart Rate (Admit) 76 bpm 81 bpm 77 bpm 78 bpm --   Heart Rate (Exercise) 96 bpm 107 bpm 104 bpm 108 bpm --   Heart Rate (Exit) 89 bpm 91 bpm 90 bpm 87 bpm --   Oxygen Saturation (Admit) 96 % -- -- -- --   Oxygen Saturation (Exercise) 96 % -- -- -- --   Rating of Perceived Exertion (Exercise) 10 13 14 13  --   Perceived Dyspnea (Exercise) 1 -- -- -- --   Symptoms none none none none --   Comments  Results 1st 2 weeks of exercise sessions -- -- --   Duration -- Progress to 30 minutes of  aerobic without signs/symptoms of physical distress Progress to 30 minutes of  aerobic without signs/symptoms of physical distress Continue with 30 min of aerobic exercise without signs/symptoms of physical distress. Continue with 30 min of aerobic exercise without signs/symptoms of physical distress.   Intensity -- THRR unchanged THRR unchanged THRR unchanged THRR unchanged     Progression   Progression -- Continue to progress workloads to maintain intensity without signs/symptoms of physical distress. Continue to progress workloads to maintain intensity without signs/symptoms of physical distress. Continue to progress workloads to maintain intensity without signs/symptoms of physical distress. Continue to progress workloads to maintain intensity without signs/symptoms of physical distress.   Average METs -- 2.86 3.57 3.29 3.29     Resistance Training   Training Prescription -- Yes Yes Yes Yes   Weight -- 5 lb 5 lb 5 lb 5 lb   Reps -- 10-15 10-15 10-15 10-15     Interval Training   Interval Training -- No No No No     Treadmill   MPH -- 2.4 2.5 2.5 2.5   Grade -- 0 0 0 0   Minutes -- 15 15 15  15  METs -- 2.84 2.91 2.91 2.91     REL-XR   Level -- 4 7 4 4    Minutes -- 15 15 15 15    METs -- -- 5.4 5 5      Biostep-RELP   Level -- 3 3 3 3    Minutes -- 15 15 15 15    METs -- 3 3 3 3      Home Exercise Plan   Plans to continue exercise at -- -- -- -- Home (comment)  RB at home and possibliy join [employer]   Frequency -- -- -- -- Add 2 additional days to program exercise sessions.   Initial Home Exercises Provided -- -- -- -- 04/15/24     Oxygen   Maintain Oxygen Saturation -- 88% or higher 88% or higher 88% or higher 88% or higher    Row Name 04/28/24 1300 05/26/24 1400           Response to Exercise   Blood Pressure (Admit) 126/76 132/60      Blood Pressure (Exercise) 148/80  --      Blood Pressure (Exit) 122/74 114/60      Heart Rate (Admit) 71 bpm 79 bpm      Heart Rate (Exercise) 110 bpm 107 bpm      Heart Rate (Exit) 84 bpm 87 bpm      Rating of Perceived Exertion (Exercise) 14 13      Symptoms none none      Duration Continue with 30 min of aerobic exercise without signs/symptoms of physical distress. Continue with 30 min of aerobic exercise without signs/symptoms of physical distress.      Intensity THRR unchanged THRR unchanged        Progression   Progression Continue to progress workloads to maintain intensity without signs/symptoms of physical distress. Continue to progress workloads to maintain intensity without signs/symptoms of physical distress.      Average METs 3.26 2.92        Resistance Training   Training Prescription Yes Yes      Weight 5 lb 5 lb      Reps 10-15 10-15        Interval Training   Interval Training No No        Treadmill   MPH 2.6 2.4      Grade 0 0      Minutes 15 15      METs 2.99 2.84        NuStep   Level 4 --      Minutes 15 --      METs 3.4 --        REL-XR   Level 4 --      Minutes 15 --      METs 5.1 --        Biostep-RELP   Level 3 3      Minutes 15 15      METs 3 3        Home Exercise Plan   Plans to continue exercise at Home (comment)  RB at home and possibliy join [employer] Home (comment)  RB at home and possibliy join [employer]      Frequency Add 2 additional days to program exercise sessions. Add 2 additional days to program exercise sessions.      Initial Home Exercises Provided 04/15/24 04/15/24        Oxygen   Maintain Oxygen Saturation 88% or higher 88% or higher  Exercise Comments:   Exercise Comments     Row Name 02/21/24 [phone]           Exercise Comments First full day of exercise!  Patient was oriented to gym and equipment including functions, settings, policies, and procedures.  Patient's individual exercise prescription and treatment plan were reviewed.   All starting workloads were established based on the results of the 6 minute walk test done at initial orientation visit.  The plan for exercise progression was also introduced and progression will be customized based on patient's performance and goals.          Exercise Goals and Review:   Exercise Goals     Row Name 02/19/24 1157             Exercise Goals   Increase Physical Activity Yes       Intervention Provide advice, education, support and counseling about physical activity/exercise needs.;Develop an individualized exercise prescription for aerobic and resistive training based on initial evaluation findings, risk stratification, comorbidities and participant's personal goals.       Expected Outcomes Short Term: Attend rehab on a regular basis to increase amount of physical activity.;Long Term: Add in home exercise to make exercise part of routine and to increase amount of physical activity.;Long Term: Exercising regularly at least 3-5 days a week.       Increase Strength and Stamina Yes       Intervention Provide advice, education, support and counseling about physical activity/exercise needs.;Develop an individualized exercise prescription for aerobic and resistive training based on initial evaluation findings, risk stratification, comorbidities and participant's personal goals.       Expected Outcomes Short Term: Increase workloads from initial exercise prescription for resistance, speed, and METs.;Short Term: Perform resistance training exercises routinely during rehab and add in resistance training at home;Long Term: Improve cardiorespiratory fitness, muscular endurance and strength as measured by increased METs and functional capacity ( )       Able to understand and use rate of perceived exertion (RPE) scale Yes       Intervention Provide education and explanation on how to use RPE scale       Expected Outcomes Short Term: Able to use RPE daily in rehab to express subjective  intensity level;Long Term:  Able to use RPE to guide intensity level when exercising independently       Able to understand and use Dyspnea scale Yes       Intervention Provide education and explanation on how to use Dyspnea scale       Expected Outcomes Short Term: Able to use Dyspnea scale daily in rehab to express subjective sense of shortness of breath during exertion;Long Term: Able to use Dyspnea scale to guide intensity level when exercising independently       Knowledge and understanding of Target Heart Rate Range (THRR) Yes       Intervention Provide education and explanation of THRR including how the numbers were predicted and where they are located for reference       Expected Outcomes Short Term: Able to state/look up THRR;Long Term: Able to use THRR to govern intensity when exercising independently;Short Term: Able to use daily as guideline for intensity in rehab       Able to check pulse independently Yes       Intervention Provide education and demonstration on how to check pulse in carotid and radial arteries.;Review the importance of being able to check your own pulse for safety during independent  exercise       Expected Outcomes Short Term: Able to explain why pulse checking is important during independent exercise;Long Term: Able to check pulse independently and accurately       Understanding of Exercise Prescription Yes       Intervention Provide education, explanation, and written materials on patient's individual exercise prescription       Expected Outcomes Short Term: Able to explain program exercise prescription;Long Term: Able to explain home exercise prescription to exercise independently          Exercise Goals Re-Evaluation :  Exercise Goals Re-Evaluation     Row Name 02/21/24 9071 03/03/24 0815 03/16/24 0936 03/19/24 1643 03/31/24 1443     Exercise Goal Re-Evaluation   Exercise Goals Review Increase Physical Activity;Able to understand and use rate of perceived  exertion (RPE) scale;Knowledge and understanding of Target Heart Rate Range (THRR);Understanding of Exercise Prescription;Increase Strength and Stamina;Able to understand and use Dyspnea scale;Able to check pulse independently Increase Physical Activity;Understanding of Exercise Prescription;Increase Strength and Stamina Increase Physical Activity;Increase Strength and Stamina;Understanding of Exercise Prescription Increase Physical Activity;Increase Strength and Stamina;Understanding of Exercise Prescription Increase Physical Activity;Increase Strength and Stamina;Understanding of Exercise Prescription   Comments Reviewed RPE and dyspnea scale, THR and program prescription with pt today.  Pt voiced understanding and was given a copy of goals to take home. Dick is off to a good start in the program and completed his first 2 weeks in this review. He worked at level 4 on the XR and level 3 on the biostep. He had a workload on the treadmill of a speed of 2.4 mph with no incline. We will continue to monitor his progress in the program. Dick is doing well in rehab. He is walking some at home on days he is not attending rehab. His wife has black card for [employer] and sometimes he will go with her to work on his core. Dick continues to do well in rehab. He was recently able to increase his level on the XR from 4 to 7. He was also able to increase his speed on the treadmill from 2.4mph to 2.5mph. We will continue to monitor his progress in the program. Dick continues to do well in rehab. He continues to walk on the treadmill at a speed of 2.5 mph with no incline. He also has consistently worked at level 2 on the biostep. We will continue to monitor his progress in the program.   Expected Outcomes Short: Use RPE daily to regulate intensity. Long: Follow program prescription in THR. Short: Continue to follow current exercise prescription. Long: Continue exercise to improve strength and stamina. STG: Continue to attend  rehab and incorperate more home exercise as able. LTG: Continue exercise to improve strength and stamina. Short: Continue to increase treadmill workloads. Long: Continue exercise to improve strength and stamina. Short: Continue to progressively increase workloads. Long: Continue exercise to improve strength and stamina.    Row Name 04/15/24 9044 04/17/24 0951 04/28/24 1349 05/14/24 1039 05/26/24 1403     Exercise Goal Re-Evaluation   Exercise Goals Review Increase Physical Activity;Able to understand and use rate of perceived exertion (RPE) scale;Knowledge and understanding of Target Heart Rate Range (THRR);Understanding of Exercise Prescription;Increase Strength and Stamina;Able to understand and use Dyspnea scale;Able to check pulse independently Increase Physical Activity;Increase Strength and Stamina;Understanding of Exercise Prescription Increase Physical Activity;Increase Strength and Stamina;Understanding of Exercise Prescription Increase Physical Activity;Increase Strength and Stamina;Understanding of Exercise Prescription Increase Physical Activity;Increase Strength and Stamina;Understanding  of Exercise Prescription   Comments Reviewed home exercise with pt today.  Pt plans to use his RB at home and possibly join [employer] for exercise.  Reviewed THR, pulse, RPE, sign and symptoms, pulse oximetery and when to call 911 or MD.  Also discussed weather considerations and indoor options.  Pt voiced understanding. Dick did not attend rehab during the last review period. He has since returned to the program and has attended three sessions. We will continue to monitor his progress in the program. Dick is doing well in rehab. He recently increased his speed to 2.6 mph with no incline. He also began using the T4 nustep at level 4 and continues to use 5 lb hand weights for resistance training. We will continue to monitor his progress in the program. Dick is currently out on medical hold. He is going to  physical therapy for his spine for a few weeks. We will continue to monitor his progress when he returns to the program. Dick returned to rehab for one session after being out on medical hold. He did well on the treadmill at a speed of 2.4 mph with no incline. He also worked at level 3 on the biostep. We will continue to monitor his progress in the program.   Expected Outcomes Short: add 1-2 days a week or exercise on off days of rehab. Long: maintain independent exercise routine upon graduation from cardiac rehab. Short: Continue to attend rehab regularly. Long: Continue exercise to improve strength and stamina. Short: Continue to progressively increase treadmill workload. Long: Continue exercise to improve strength and stamina. Short: Return to rehab when appropriate. Long: Graduate. Short: Progressively increase back up to previous workloads. Long: Continue exercise to improve strength and stamina.      Discharge Exercise Prescription (Final Exercise Prescription Changes):  Exercise Prescription Changes - 05/26/24 1400       Response to Exercise   Blood Pressure (Admit) 132/60    Blood Pressure (Exit) 114/60    Heart Rate (Admit) 79 bpm    Heart Rate (Exercise) 107 bpm    Heart Rate (Exit) 87 bpm    Rating of Perceived Exertion (Exercise) 13    Symptoms none    Duration Continue with 30 min of aerobic exercise without signs/symptoms of physical distress.    Intensity THRR unchanged      Progression   Progression Continue to progress workloads to maintain intensity without signs/symptoms of physical distress.    Average METs 2.92      Resistance Training   Training Prescription Yes    Weight 5 lb    Reps 10-15      Interval Training   Interval Training No      Treadmill   MPH 2.4    Grade 0    Minutes 15    METs 2.84      Biostep-RELP   Level 3    Minutes 15    METs 3      Home Exercise Plan   Plans to continue exercise at Home (comment)   RB at home and possibliy join  [employer]   Frequency Add 2 additional days to program exercise sessions.    Initial Home Exercises Provided 04/15/24      Oxygen   Maintain Oxygen Saturation 88% or higher          Nutrition:  Target Goals: Understanding of nutrition guidelines, daily intake of sodium 1500mg , cholesterol 200mg , calories 30% from fat and 7% or less from  saturated fats, daily to have 5 or more servings of fruits and vegetables.  Education: Nutrition 1 -Group instruction provided by verbal, written material, interactive activities, discussions, models, and posters to present general guidelines for heart healthy nutrition including macronutrients, label reading, and promoting whole foods over processed counterparts. Education serves as pensions consultant of discussion of heart healthy eating for all. Written material provided at class time.    Education: Nutrition 2 -Group instruction provided by verbal, written material, interactive activities, discussions, models, and posters to present general guidelines for heart healthy nutrition including sodium, cholesterol, and saturated fat. Providing guidance of habit forming to improve blood pressure, cholesterol, and body weight. Written material provided at class time.     Biometrics:  Pre Biometrics - 02/19/24 1157       Pre Biometrics   Height 5' 6.5 (1.689 m)    Weight 210 lb 3.2 oz (95.3 kg)    Waist Circumference 42.5 inches    Hip Circumference 42 inches    Waist to Hip Ratio 1.01 %    BMI (Calculated) 33.42    Single Leg Stand 30 seconds           Nutrition Therapy Plan and Nutrition Goals:  Nutrition Therapy & Goals - 02/19/24 1141       Nutrition Therapy   RD appointment deferred Yes      Intervention Plan   Intervention Prescribe, educate and counsel regarding individualized specific dietary modifications aiming towards targeted core components such as weight, hypertension, lipid management, diabetes, heart failure and other  comorbidities.    Expected Outcomes Short Term Goal: Understand basic principles of dietary content, such as calories, fat, sodium, cholesterol and nutrients.;Short Term Goal: A plan has been developed with personal nutrition goals set during dietitian appointment.;Long Term Goal: Adherence to prescribed nutrition plan.          Nutrition Assessments:  MEDIFICTS Score Key: >=70 Need to make dietary changes  40-70 Heart Healthy Diet <= 40 Therapeutic Level Cholesterol Diet  Flowsheet Row Cardiac Rehab from 02/19/2024 in [facility] Cardiac and Pulmonary Rehab  Picture Your Plate Total Score on Admission 66   Picture Your Plate Scores: <59 Unhealthy dietary pattern with much room for improvement. 41-50 Dietary pattern unlikely to meet recommendations for good health and room for improvement. 51-60 More healthful dietary pattern, with some room for improvement.  >60 Healthy dietary pattern, although there may be some specific behaviors that could be improved.    Nutrition Goals Re-Evaluation:  Nutrition Goals Re-Evaluation     Row Name 03/16/24 0939 04/13/24 0931 06/01/24 0929         Goals   Comment Dick deferred RD appointment Dick deferred RD appointment Dick deferred RD appointment        Nutrition Goals Discharge (Final Nutrition Goals Re-Evaluation):  Nutrition Goals Re-Evaluation - 06/01/24 0929       Goals   Comment Dick deferred RD appointment          Psychosocial: Target Goals: Acknowledge presence or absence of significant depression and/or stress, maximize coping skills, provide positive support system. Participant is able to verbalize types and ability to use techniques and skills needed for reducing stress and depression.   Education: Stress, Anxiety, and Depression - Group verbal and visual presentation to define topics covered.  Reviews how body is impacted by stress, anxiety, and depression.  Also discusses healthy ways to reduce stress and to treat/manage  anxiety and depression. Written material provided at class time. Flowsheet Row Cardiac  Rehab from 03/11/2024 in [facility] Cardiac and Pulmonary Rehab  Education need identified 02/19/24    Education: Sleep Hygiene -Provides group verbal and written instruction about how sleep can affect your health.  Define sleep hygiene, discuss sleep cycles and impact of sleep habits. Review good sleep hygiene tips.   Initial Review & Psychosocial Screening:  Initial Psych Review & Screening - 02/14/24 1042       Initial Review   Current issues with Current Stress Concerns      Family Dynamics   Good Support System? Yes      Barriers   Psychosocial barriers to participate in program There are no identifiable barriers or psychosocial needs.      Screening Interventions   Interventions Encouraged to exercise;Provide feedback about the scores to participant;To provide support and resources with identified psychosocial needs    Expected Outcomes Short Term goal: Utilizing psychosocial counselor, staff and physician to assist with identification of specific Stressors or current issues interfering with healing process. Setting desired goal for each stressor or current issue identified.;Long Term Goal: Stressors or current issues are controlled or eliminated.;Short Term goal: Identification and review with participant of any Quality of Life or Depression concerns found by scoring the questionnaire.;Long Term goal: The participant improves quality of Life and PHQ9 Scores as seen by post scores and/or verbalization of changes          Quality of Life Scores:   Quality of Life - 02/19/24 1142       Quality of Life   Select Quality of Life      Quality of Life Scores   Health/Function Pre 23.8 %    Socioeconomic Pre 25.63 %    Psych/Spiritual Pre 26.64 %    Family Pre 30 %    GLOBAL Pre 25.67 %         Scores of 19 and below usually indicate a poorer quality of life in these areas.  A difference of   2-3 points is a clinically meaningful difference.  A difference of 2-3 points in the total score of the Quality of Life Index has been associated with significant improvement in overall quality of life, self-image, physical symptoms, and general health in studies assessing change in quality of life.  PHQ-9: Review Flowsheet  More data may exist      02/19/2024 05/26/2021 03/09/2021 01/23/2021 07/14/2020  Depression screen PHQ 2/9  Decreased Interest 0 0 0 0 0  Down, Depressed, Hopeless 0 0 0 0 0  PHQ - 2 Score 0 0 0 0 0  Altered sleeping 0 0 0 - -  Tired, decreased energy 2 1 1  - -  Change in appetite 0 1 1 - -  Feeling bad or failure about yourself  0 0 0 - -  Trouble concentrating 0 0 0 - -  Moving slowly or fidgety/restless 1 0 0 - -  Suicidal thoughts 0 0 0 - -  PHQ-9 Score 3  2  2   - -  Difficult doing work/chores Not difficult at all Not difficult at all Not difficult at all - -    Details       Data saved with a previous flowsheet row definition        Interpretation of Total Score  Total Score Depression Severity:  1-4 = Minimal depression, 5-9 = Mild depression, 10-14 = Moderate depression, 15-19 = Moderately severe depression, 20-27 = Severe depression   Psychosocial Evaluation and Intervention:  Psychosocial Evaluation - 02/14/24  1047       Psychosocial Evaluation & Interventions   Interventions Encouraged to exercise with the program and follow exercise prescription;Relaxation education    Comments 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 He states he has been walking consistently, but mentions some stress because he feels like he should be further along in his progress. He stays active and is ready to get involved in the program to increase his stamina. He started sleeping in his bed this past week and has been getting a consistent 8 hours which he is pleased with.    Expected Outcomes Short: attend cardiac rehab for education and exercise Long: develop  and maintain positive self care habits    Continue Psychosocial Services  Follow up required by staff          Psychosocial Re-Evaluation:  Psychosocial Re-Evaluation     Row Name 03/16/24 [phone] 04/13/24 0932 06/01/24 0935         Psychosocial Re-Evaluation   Current issues with None Identified None Identified None Identified     Comments Dick denies any stress, anxiety, or depression at this time. He reports he sleeps well and wakes well rested. Patient reports no issues with their current mental states, sleep, stress, depression or anxiety. Will follow up with patient in a few weeks for any changes. Mallory mood has been good and has no concerns with his mental health. He is stressed slightly from his knee and back pain. He is awaiting his shots for his knee pain.     Expected Outcomes STG: Continue to attend rehab and focus on good sleep. LTG: Achieve and maintain postivie outlook on health and daily life Short: Continue to exercise regularly to support mental health and notify staff of any changes. Long: maintain mental health and well being through teaching of rehab or prescribed medications independently. Short: get shots for knee pain to help mood. Long: maintain mental health independently     Interventions Encouraged to attend Cardiac Rehabilitation for the exercise Encouraged to attend Cardiac Rehabilitation for the exercise --     Continue Psychosocial Services  Follow up required by staff Follow up required by staff Follow up required by staff        Psychosocial Discharge (Final Psychosocial Re-Evaluation):  Psychosocial Re-Evaluation - 06/01/24 0935       Psychosocial Re-Evaluation   Current issues with None Identified    Comments Ad mood has been good and has no concerns with his mental health. He is stressed slightly from his knee and back pain. He is awaiting his shots for his knee pain.    Expected Outcomes Short: get shots for knee pain to help mood. Long:  maintain mental health independently    Continue Psychosocial Services  Follow up required by staff          Vocational Rehabilitation: Provide vocational rehab assistance to qualifying candidates.   Vocational Rehab Evaluation & Intervention:  Vocational Rehab - 02/14/24 1042       Initial Vocational Rehab Evaluation & Intervention   Assessment shows need for Vocational Rehabilitation No          Education: Education Goals: Education classes will be provided on a variety of topics geared toward better understanding of heart health and risk factor modification. Participant will state understanding/return demonstration of topics presented as noted by education test scores.  Learning Barriers/Preferences:  Learning Barriers/Preferences - 02/14/24 1040       Learning Barriers/Preferences   Learning  Barriers None    Learning Preferences Individual Instruction          General Cardiac Education Topics:  AED/CPR: - Group verbal and written instruction with the use of models to demonstrate the basic use of the AED with the basic ABC's of resuscitation.   Test and Procedures: - Group verbal and visual presentation and models provide information about basic cardiac anatomy and function. Reviews the testing methods done to diagnose heart disease and the outcomes of the test results. Describes the treatment choices: Medical Management, Angioplasty, or Coronary Bypass Surgery for treating various heart conditions including Myocardial Infarction, Angina, Valve Disease, and Cardiac Arrhythmias. Written material provided at class time. Flowsheet Row Cardiac Rehab from 03/11/2024 in [facility] Cardiac and Pulmonary Rehab  Education need identified 02/19/24    Medication Safety: - Group verbal and visual instruction to review commonly prescribed medications for heart and lung disease. Reviews the medication, class of the drug, and side effects. Includes the steps to properly store meds and  maintain the prescription regimen. Written material provided at class time. Flowsheet Row Cardiac Rehab from 05/24/2021 in [facility] Cardiac and Pulmonary Rehab  Date 05/17/21  Educator SB  Instruction Review Code 1- Verbalizes Understanding    Intimacy: - Group verbal instruction through game format to discuss how heart and lung disease can affect sexual intimacy. Written material provided at class time.   Know Your Numbers and Heart Failure: - Group verbal and visual instruction to discuss disease risk factors for cardiac and pulmonary disease and treatment options.  Reviews associated critical values for Overweight/Obesity, Hypertension, Cholesterol, and Diabetes.  Discusses basics of heart failure: signs/symptoms and treatments.  Introduces Heart Failure Zone chart for action plan for heart failure. Written material provided at class time. Flowsheet Row Cardiac Rehab from 05/24/2021 in [facility] Cardiac and Pulmonary Rehab  Date 05/24/21  Educator SB  Instruction Review Code 1- Verbalizes Understanding    Infection Prevention: - Provides verbal and written material to individual with discussion of infection control including proper hand washing and proper equipment cleaning during exercise session. Flowsheet Row Cardiac Rehab from 03/11/2024 in [facility] Cardiac and Pulmonary Rehab  Date 02/19/24  Educator NT  Instruction Review Code 1- Verbalizes Understanding    Falls Prevention: - Provides verbal and written material to individual with discussion of falls prevention and safety. Flowsheet Row Cardiac Rehab from 03/11/2024 in [facility] Cardiac and Pulmonary Rehab  Date 02/19/24  Educator NT  Instruction Review Code 1- Verbalizes Understanding    Other: -Provides group and verbal instruction on various topics (see comments)   Knowledge Questionnaire Score:  Knowledge Questionnaire Score - 02/19/24 1141       Knowledge Questionnaire Score   Pre Score 24/26          Core Components/Risk  Factors/Patient Goals at Admission:  Personal Goals and Risk Factors at Admission - 02/14/24 1035       Core Components/Risk Factors/Patient Goals on Admission    Weight Management Yes    Intervention Weight Management: Develop a combined nutrition and exercise program designed to reach desired caloric intake, while maintaining appropriate intake of nutrient and fiber, sodium and fats, and appropriate energy expenditure required for the weight goal.;Weight Management: Provide education and appropriate resources to help participant work on and attain dietary goals.;Weight Management/Obesity: Establish reasonable short term and long term weight goals.    Goal Weight: Long Term 185 lb (83.9 kg)    Expected Outcomes Short Term: Continue to assess and modify interventions until  short term weight is achieved;Long Term: Adherence to nutrition and physical activity/exercise program aimed toward attainment of established weight goal;Weight Loss: Understanding of general recommendations for a balanced deficit meal plan, which promotes 1-2 lb weight loss per week and includes a negative energy balance of [phone] kcal/d;Understanding recommendations for meals to include 15-35% energy as protein, 25-35% energy from fat, 35-60% energy from carbohydrates, less than 200mg  of dietary cholesterol, 20-35 gm of total fiber daily;Understanding of distribution of calorie intake throughout the day with the consumption of 4-5 meals/snacks    Diabetes Yes    Intervention Provide education about signs/symptoms and action to take for hypo/hyperglycemia.;Provide education about proper nutrition, including hydration, and aerobic/resistive exercise prescription along with prescribed medications to achieve blood glucose in normal ranges: Fasting glucose 65-99 mg/dL    Expected Outcomes Short Term: Participant verbalizes understanding of the signs/symptoms and immediate care of hyper/hypoglycemia, proper foot care and importance of  medication, aerobic/resistive exercise and nutrition plan for blood glucose control.;Long Term: Attainment of HbA1C < 7%.    Hypertension Yes    Intervention Provide education on lifestyle modifcations including regular physical activity/exercise, weight management, moderate sodium restriction and increased consumption of fresh fruit, vegetables, and low fat dairy, alcohol moderation, and smoking cessation.;Monitor prescription use compliance.    Expected Outcomes Short Term: Continued assessment and intervention until BP is < 140/64mm HG in hypertensive participants. < 130/85mm HG in hypertensive participants with diabetes, heart failure or chronic kidney disease.;Long Term: Maintenance of blood pressure at goal levels.    Lipids Yes    Intervention Provide education and support for participant on nutrition & aerobic/resistive exercise along with prescribed medications to achieve LDL 70mg , HDL >40mg .    Expected Outcomes Short Term: Participant states understanding of desired cholesterol values and is compliant with medications prescribed. Participant is following exercise prescription and nutrition guidelines.;Long Term: Cholesterol controlled with medications as prescribed, with individualized exercise RX and with personalized nutrition plan. Value goals: LDL < 70mg , HDL > 40 mg.          Education:Diabetes - Individual verbal and written instruction to review signs/symptoms of diabetes, desired ranges of glucose level fasting, after meals and with exercise. Acknowledge that pre and post exercise glucose checks will be done for 3 sessions at entry of program.   Core Components/Risk Factors/Patient Goals Review:   Goals and Risk Factor Review     Row Name 03/16/24 0940 04/13/24 0927 06/01/24 0928         Core Components/Risk Factors/Patient Goals Review   Personal Goals Review Hypertension;Diabetes Weight Management/Obesity;Diabetes Weight Management/Obesity     Review Dick reports he  doesnt check his BP at home, but has started checking his BS more. Admits it is something he does not like doing and still working on making it a consisitent habit. Harshaan is watching his sugar at home and had a bout with prednisone. He is around 150 fasting at the moment. He keeps close watch of his blood sugar. He would like to reduce his weight and reach a weight goal of 185 pounds. Apolonio has been limited since his back and knees are hurting. He wants to lose some weight but finds it hard to lose when he cannot move around like he wants to. He is getting shots in his knee to help him be more mobile. His weight goal is around 185lbs but would like to get to 200 lbs.     Expected Outcomes STG: Check BS more consistently, take meds as prescribed.  LTG: Manage risk factors independently Short: lose a few pounds in the next few weeks. Long: reach weight goal. Short: continue to work on weight loss and lose a few pounds. Long: Reach weight goal.        Core Components/Risk Factors/Patient Goals at Discharge (Final Review):   Goals and Risk Factor Review - 06/01/24 0928       Core Components/Risk Factors/Patient Goals Review   Personal Goals Review Weight Management/Obesity    Review Karel has been limited since his back and knees are hurting. He wants to lose some weight but finds it hard to lose when he cannot move around like he wants to. He is getting shots in his knee to help him be more mobile. His weight goal is around 185lbs but would like to get to 200 lbs.    Expected Outcomes Short: continue to work on weight loss and lose a few pounds. Long: Reach weight goal.          ITP Comments:  ITP Comments     Row Name 02/14/24 1051 02/19/24 1135 02/21/24 0928 03/18/24 1455 04/15/24 1014   ITP Comments Initial phone call completed. Diagnosis can be found in [facility] 7/11. EP Orientation scheduled for Wednesday 8/27 at 9:30. Completed and gym orientation for cardiac rehab. Initial ITP created  and sent for review to Dr. Oneil Pinal, Medical Director. First full day of exercise!  Patient was oriented to gym and equipment including functions, settings, policies, and procedures.  Patient's individual exercise prescription and treatment plan were reviewed.  All starting workloads were established based on the results of the 6 minute walk test done at initial orientation visit.  The plan for exercise progression was also introduced and progression will be customized based on patient's performance and goals. 30 Day review completed. Medical Director ITP review done; changes made as directed and signed approval by Medical Director. New to program. 30 Day review completed. Medical Director ITP review done; changes made as directed and signed approval by Medical Director.    Row Name 04/20/24 1545 05/13/24 0938 06/10/24 0804       ITP Comments Rick called to inform staff that his MD wants him to take a three week break from cardiac rehab to do PT for his back. He will call back in three weeks to give staff update on when he can return. 30 Day review completed. Medical Director ITP review done; changes made as directed and signed approval by Medical Director. Patient has been out since last review. 30 Day review completed. Medical Director ITP review done, changes made as directed, and signed approval by Medical Director.        Comments: 30 day review    [1]  Current Outpatient Medications:    ALPRAZolam  (XANAX ) 0.5 MG tablet, Take 0.25 mg by mouth at bedtime as needed for sleep., Disp: , Rfl:    amLODipine  (NORVASC ) 5 MG tablet, TAKE 1 TABLET EVERY DAY, Disp: 90 tablet, Rfl: 3   apixaban  (ELIQUIS ) 5 MG TABS tablet, Take 1 tablet (5 mg total) by mouth 2 (two) times daily., Disp: 180 tablet, Rfl: 3   apixaban  (ELIQUIS ) 5 MG TABS tablet, Take 1 tablet (5 mg total) by mouth 2 (two) times daily., Disp: 60 tablet, Rfl: 0   atorvastatin  (LIPITOR ) 40 MG tablet, Take 1 tablet (40 mg total) by mouth  daily., Disp: 90 tablet, Rfl: 3   Cholecalciferol  250 MCG (10000 UT) TABS, Take 1,000 Units by mouth daily., Disp: ,  Rfl:    clopidogrel  (PLAVIX ) 75 MG tablet, Take 1 tablet (75 mg total) by mouth daily., Disp: 90 tablet, Rfl: 2   cyanocobalamin  1000 MCG tablet, Take 1,000 mcg by mouth daily., Disp: , Rfl:    Fe Fum-Vit C-Vit B12-FA (TRIGELS-F FORTE) CAPS capsule, Take 1 capsule by mouth daily after breakfast., Disp: 30 capsule, Rfl: 2   fluticasone  (FLONASE ) 50 MCG/ACT nasal spray, Place 2 sprays into both nostrils daily. , Disp: , Rfl:    losartan  (COZAAR ) 25 MG tablet, Take 1 tablet (25 mg total) by mouth daily., Disp: 90 tablet, Rfl: 3   melatonin 3 MG TABS tablet, Take 10 mg by mouth at bedtime., Disp: , Rfl:    metFORMIN  (GLUCOPHAGE ) 500 MG tablet, Take 500 mg by mouth 2 (two) times daily., Disp: , Rfl:    metoprolol  tartrate (LOPRESSOR ) 25 MG tablet, Take 1 tablet (25 mg total) by mouth 2 (two) times daily. (Patient taking differently: Take 25 mg by mouth daily.), Disp: 180 tablet, Rfl: 3   pantoprazole  (PROTONIX ) 40 MG tablet, Take 40 mg by mouth 2 (two) times daily before a meal., Disp: , Rfl:  [2]  Social History Tobacco Use  Smoking Status Never  Smokeless Tobacco Never

## 2024-06-12 ENCOUNTER — Encounter

## 2024-06-12 DIAGNOSIS — I214 Non-ST elevation (NSTEMI) myocardial infarction: Secondary | ICD-10-CM

## 2024-06-12 DIAGNOSIS — Z951 Presence of aortocoronary bypass graft: Secondary | ICD-10-CM | POA: Diagnosis not present

## 2024-06-12 NOTE — Progress Notes (Signed)
 Daily Session Note  Patient Details  Name: Daniel Holden MRN: [id number] Date of Birth: 08-13-1945 Referring Provider:   Flowsheet Row Cardiac Rehab from 02/19/2024 in [facility] Cardiac and Pulmonary Rehab  Referring Provider Dr. Wilbert Bihari, MD    Encounter Date: 06/12/2024  Check In:  Session Check In - 06/12/24 0856       Check-In   Supervising physician immediately available to respond to emergencies See telemetry face sheet for immediately available ER MD    Location [facility]-Cardiac & Pulmonary Rehab    Staff Present Maxon Conetta BS, Exercise Physiologist;Joseph Rolinda NORWOOD HARMAN Tsosie Peggi, RN, DNP, NE-BC    Virtual Visit No    Medication changes reported     No    Fall or balance concerns reported    No    Warm-up and Cool-down Performed on first and last piece of equipment    VAD Patient? No    PAD/SET Patient? No      Pain Assessment   Currently in Pain? No/denies             Tobacco Use History[1]  Goals Met:  Independence with exercise equipment Exercise tolerated well No report of concerns or symptoms today Strength training completed today  Goals Unmet:  Not Applicable  Comments: Pt able to follow exercise prescription today without complaint.  Will continue to monitor for progression.    Dr. Oneil Pinal is Medical Director for [facility] Cardiac Rehabilitation.  Dr. Fuad Aleskerov is Medical Director for [facility] Pulmonary Rehabilitation.    [1]  Social History Tobacco Use  Smoking Status Never  Smokeless Tobacco Never

## 2024-06-12 NOTE — Telephone Encounter (Signed)
 Call to patient to advise ok to stop aspirin  and start eliquis  after injections. Patient verbalizes understanding and says he sees his spine doctor today and will discuss when to start eliquis .

## 2024-06-12 NOTE — Telephone Encounter (Signed)
-----   Message from Wilbert Bihari, MD sent at 06/04/2024  8:48 PM EST ----- I am fine with that - he should check with the MD doing his injections when it would be safe to start after the injections have been completed ----- Message ----- From: Janit Geni CROME, RN Sent: 06/03/2024   5:20 PM EST To: Wilbert JONELLE Bihari, MD  ----- Message from Geni CROME Janit, RN sent at 06/03/2024  5:20 PM EST -----  Patient is willing to stop aspirin  and start eliquis  but would rather make that change after he does his spinal injections that he just got cardiac clearance for. ----- Message ----- From: Bihari Wilbert JONELLE, MD Sent: 05/30/2024  12:15 PM EST To: Geni CROME Janit, RN  Heart monitor showed no evidence of recurrent A-fib.  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  Discussed with EP and recommend going on DOAC with Eliquis  5mg  BID and stop ASA and continue Plavix  ----- Message ----- From: Janit Geni CROME, RN Sent: 05/28/2024   5:30 PM EST To: Wilbert JONELLE Bihari, MD  ----- Message from Geni CROME Janit, RN sent at 05/28/2024  5:30 PM EST -----  Patient responses. ----- Message ----- From: Bihari Wilbert JONELLE, MD Sent: 05/22/2024   8:48 PM EST To: Geni CROME Janit, RN  Please verify if he is taking Metoprolol  tartrate or succinate ----- Message ----- From: Janit Geni CROME, RN Sent: 05/20/2024  12:50 PM EST To: Wilbert JONELLE Bihari, MD  Patient denies any palpitations and says he is feeling great.  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 Maybe should have the heart monitor before my heart attack on 01/01/24. Appreciate you! Thanks, Daniel Holden ----- Message ----- From: Bihari Wilbert JONELLE, MD Sent: 05/14/2024   3:43 PM EST To: Geni CROME Janit, RN  Heart monitor showed 1 episode of NSVT lasting 4 beats. No afib.  Please let us   know if he has any further palpitations ----- Message ----- From: Bihari Wilbert JONELLE, MD Sent: 05/14/2024   6:37 AM EST To: Wilbert JONELLE Bihari, MD

## 2024-06-15 ENCOUNTER — Encounter

## 2024-06-17 ENCOUNTER — Encounter

## 2024-06-17 ENCOUNTER — Encounter: Payer: Self-pay | Admitting: Cardiology

## 2024-06-17 DIAGNOSIS — Z951 Presence of aortocoronary bypass graft: Secondary | ICD-10-CM

## 2024-06-17 DIAGNOSIS — I214 Non-ST elevation (NSTEMI) myocardial infarction: Secondary | ICD-10-CM

## 2024-06-17 NOTE — Progress Notes (Signed)
 Daily Session Note  Patient Details  Name: Daniel Holden MRN: [id number] Date of Birth: [birth year] Referring Provider:   Flowsheet Row Cardiac Rehab from 02/19/2024 in [facility] Cardiac and Pulmonary Rehab  Referring Provider Dr. Wilbert Bihari, MD    Encounter Date: 06/17/2024  Check In:  Session Check In - 06/17/24 0906       Check-In   Supervising physician immediately available to respond to emergencies See telemetry face sheet for immediately available ER MD    Location [facility]-Cardiac & Pulmonary Rehab    Staff Present Burnard Davenport RN,BSN,MPA;Laura Cates RN,BSN;Joseph Rolinda NORWOOD HARMAN Verlie Laird, [place], Exercise Physiologist    Virtual Visit No    Medication changes reported     No    Fall or balance concerns reported    No    Tobacco Cessation No Change    Warm-up and Cool-down Performed on first and last piece of equipment    Resistance Training Performed Yes    VAD Patient? No    PAD/SET Patient? No      Pain Assessment   Currently in Pain? No/denies             Tobacco Use History[1]  Goals Met:  Independence with exercise equipment Exercise tolerated well No report of concerns or symptoms today Strength training completed today  Goals Unmet:  Not Applicable  Comments: Pt able to follow exercise prescription today without complaint.  Will continue to monitor for progression.    Dr. Oneil Pinal is Medical Director for [facility] Cardiac Rehabilitation.  Dr. Fuad Aleskerov is Medical Director for [facility] Pulmonary Rehabilitation.    [1]  Social History Tobacco Use  Smoking Status Never  Smokeless Tobacco Never

## 2024-06-19 MED ORDER — APIXABAN 5 MG PO TABS: 5.0000 mg | ORAL_TABLET | Freq: Two times a day (BID) | ORAL | 3 refills | Status: AC

## 2024-06-19 NOTE — Telephone Encounter (Signed)
 Prescription refill request for Eliquis  received. Indication: PAF Last office visit: 05/29/24 Scr: 0.9 (06/15/24 EPIC) Age: 78 Weight: 212 lbs  Will send in mail order as instructed by the patient.

## 2024-06-22 ENCOUNTER — Encounter

## 2024-06-22 DIAGNOSIS — Z951 Presence of aortocoronary bypass graft: Secondary | ICD-10-CM | POA: Diagnosis not present

## 2024-06-22 DIAGNOSIS — I214 Non-ST elevation (NSTEMI) myocardial infarction: Secondary | ICD-10-CM

## 2024-06-22 NOTE — Progress Notes (Signed)
 Daily Session Note  Patient Details  Name: Daniel Holden MRN: [id number] Date of Birth: 15-Jun-1946 Referring Provider:   Flowsheet Row Cardiac Rehab from 02/19/2024 in [facility] Cardiac and Pulmonary Rehab  Referring Provider Dr. Wilbert Bihari, MD    Encounter Date: 06/22/2024  Check In:  Session Check In - 06/22/24 0941       Check-In   Supervising physician immediately available to respond to emergencies See telemetry face sheet for immediately available ER MD    Location [facility]-Cardiac & Pulmonary Rehab    Staff Present Burnard Davenport RN,BSN,MPA;Maxon Conetta BS, Exercise Physiologist;Joseph Rolinda NORWOOD HARMAN Cecilie Delores, BS, RRT, CPFT    Virtual Visit No    Medication changes reported     No    Fall or balance concerns reported    No    Tobacco Cessation No Change    Warm-up and Cool-down Performed on first and last piece of equipment    Resistance Training Performed Yes    VAD Patient? No    PAD/SET Patient? No      Pain Assessment   Currently in Pain? No/denies             Tobacco Use History[1]  Goals Met:  Independence with exercise equipment Exercise tolerated well No report of concerns or symptoms today Strength training completed today  Goals Unmet:  Not Applicable  Comments: Pt able to follow exercise prescription today without complaint.  Will continue to monitor for progression.    Dr. Oneil Pinal is Medical Director for [facility] Cardiac Rehabilitation.  Dr. Fuad Aleskerov is Medical Director for [facility] Pulmonary Rehabilitation.    [1]  Social History Tobacco Use  Smoking Status Never  Smokeless Tobacco Never

## 2024-06-24 ENCOUNTER — Encounter

## 2024-06-24 VITALS — Ht 66.5 in | Wt 209.4 lb

## 2024-06-24 DIAGNOSIS — Z951 Presence of aortocoronary bypass graft: Secondary | ICD-10-CM | POA: Diagnosis not present

## 2024-06-24 DIAGNOSIS — I214 Non-ST elevation (NSTEMI) myocardial infarction: Secondary | ICD-10-CM

## 2024-06-24 NOTE — Patient Instructions (Signed)
 Discharge Patient Instructions  Patient Details  Name: Daniel Holden MRN: [id number] Date of Birth: [birth year] Referring Provider:  Sherial Bail, MD   Number of Visits: 4  Reason for Discharge:  Patient reached a stable level of exercise. Patient independent in their exercise. Patient has met program and personal goals.  Diagnosis:  S/P CABG x 3  NSTEMI (non-ST elevated myocardial infarction) [facility])  Initial Exercise Prescription:  Initial Exercise Prescription - 02/19/24 1100       Date of Initial Exercise RX and Referring Provider   Date 02/19/24    Referring Provider Dr. Wilbert Bihari, MD      Oxygen   Maintain Oxygen Saturation 88% or higher      Treadmill   MPH 2.2    Grade 0    Minutes 15    METs 2.68      REL-XR   Level 3    Speed 50    Minutes 15    METs 2.26      Biostep-RELP   Level 2    SPM 50    Minutes 15    METs 2.26      Track   Laps 26    Minutes 15    METs 2.41      Prescription Details   Frequency (times per week) 3    Duration Progress to 30 minutes of continuous aerobic without signs/symptoms of physical distress      Intensity   THRR 40-80% of Max Heartrate 103-131    Ratings of Perceived Exertion 11-13    Perceived Dyspnea 0-4      Progression   Progression Continue to progress workloads to maintain intensity without signs/symptoms of physical distress.      Resistance Training   Training Prescription Yes    Weight 5 lb    Reps 10-15          Discharge Exercise Prescription (Final Exercise Prescription Changes):  Exercise Prescription Changes - 06/10/24 1100       Response to Exercise   Blood Pressure (Admit) 118/62    Blood Pressure (Exit) 116/60    Heart Rate (Admit) 77 bpm    Heart Rate (Exercise) 101 bpm    Heart Rate (Exit) 83 bpm    Rating of Perceived Exertion (Exercise) 14    Symptoms none    Duration Continue with 30 min of aerobic exercise without signs/symptoms of physical distress.     Intensity THRR unchanged      Progression   Progression Continue to progress workloads to maintain intensity without signs/symptoms of physical distress.    Average METs 2.63      Resistance Training   Weight 5 lb    Reps 10-15      Interval Training   Interval Training No      Treadmill   MPH 2.4    Grade 0    Minutes 15    METs 2.84      NuStep   Level 4   T6 nustep   Minutes 15    METs 3.1      REL-XR   Level 3    Minutes 15      T5 Nustep   Level 2    Minutes 15    METs 2.2      Biostep-RELP   Level 3    Minutes 15    METs 2.2      Home Exercise Plan   Plans to continue exercise at Home (comment)  RB at home and possibliy join [employer]   Frequency Add 2 additional days to program exercise sessions.    Initial Home Exercises Provided 04/15/24      Oxygen   Maintain Oxygen Saturation 88% or higher          Functional Capacity:  6 Minute Walk     Row Name 02/19/24 1143 06/24/24 0935       6 Minute Walk   Phase Initial Discharge    Distance 1195 feet 1415 feet    Distance % Change -- 18.41 %    Distance Feet Change -- 220 ft    Walk Time 6 minutes 6 minutes    # of Rest Breaks 0 0    MPH 2.26 2.68    METS 2.26 2.7    RPE 10 13    Perceived Dyspnea  1 0    VO2 Peak 7.92 9.45    Symptoms No No    Resting HR 76 bpm 77 bpm    Resting BP 142/68 130/60    Resting Oxygen Saturation  96 % 96 %    Exercise Oxygen Saturation  during 6 min walk 96 % 97 %    Max Ex. HR 96 bpm 100 bpm    Max Ex. BP 160/72 160/60    2 Minute Post BP 154/72 150/70      Nutrition & Weight - Outcomes:  Pre Biometrics - 02/19/24 1157       Pre Biometrics   Height 5' 6.5 (1.689 m)    Weight 210 lb 3.2 oz (95.3 kg)    Waist Circumference 42.5 inches    Hip Circumference 42 inches    Waist to Hip Ratio 1.01 %    BMI (Calculated) 33.42    Single Leg Stand 30 seconds          Post Biometrics - 06/24/24 0937        Post  Biometrics   Height 5' 6.5  (1.689 m)    Weight 209 lb 6.4 oz (95 kg)    Waist Circumference 45.5 inches    Hip Circumference 45 inches    Waist to Hip Ratio 1.01 %    BMI (Calculated) 33.3    Single Leg Stand 30 seconds

## 2024-06-24 NOTE — Progress Notes (Signed)
 Daily Session Note  Patient Details  Name: Daniel Holden MRN: [id number] Date of Birth: [birth year] Referring Provider:   Flowsheet Row Cardiac Rehab from 02/19/2024 in [facility] Cardiac and Pulmonary Rehab  Referring Provider Dr. Wilbert Bihari, MD    Encounter Date: 06/24/2024  Check In:  Session Check In - 06/24/24 0904       Check-In   Supervising physician immediately available to respond to emergencies See telemetry face sheet for immediately available ER MD    Location [facility]-Cardiac & Pulmonary Rehab    Staff Present Burnard Davenport RN,BSN,MPA;Joseph [facility] RCP,RRT,BSRT;Margaret Best, MS, Exercise Physiologist    Virtual Visit No    Medication changes reported     No    Fall or balance concerns reported    No    Tobacco Cessation No Change    Warm-up and Cool-down Performed on first and last piece of equipment    Resistance Training Performed Yes    VAD Patient? No    PAD/SET Patient? No      Pain Assessment   Currently in Pain? No/denies             Tobacco Use History[1]  Goals Met:  Independence with exercise equipment Exercise tolerated well No report of concerns or symptoms today Strength training completed today  Goals Unmet:  Not Applicable  Comments: Pt able to follow exercise prescription today without complaint.  Will continue to monitor for progression.    Dr. Oneil Pinal is Medical Director for [facility] Cardiac Rehabilitation.  Dr. Fuad Aleskerov is Medical Director for [facility] Pulmonary Rehabilitation.    [1]  Social History Tobacco Use  Smoking Status Never  Smokeless Tobacco Never

## 2024-06-26 ENCOUNTER — Encounter: Attending: Cardiology | Admitting: Emergency Medicine

## 2024-06-26 ENCOUNTER — Other Ambulatory Visit (HOSPITAL_COMMUNITY): Payer: Self-pay

## 2024-06-26 DIAGNOSIS — I214 Non-ST elevation (NSTEMI) myocardial infarction: Secondary | ICD-10-CM

## 2024-06-26 DIAGNOSIS — Z48812 Encounter for surgical aftercare following surgery on the circulatory system: Secondary | ICD-10-CM | POA: Insufficient documentation

## 2024-06-26 DIAGNOSIS — Z951 Presence of aortocoronary bypass graft: Secondary | ICD-10-CM | POA: Insufficient documentation

## 2024-06-26 DIAGNOSIS — I252 Old myocardial infarction: Secondary | ICD-10-CM | POA: Insufficient documentation

## 2024-06-26 NOTE — Progress Notes (Signed)
 Daily Session Note  Patient Details  Name: Daniel Holden MRN: [id number] Date of Birth: 11-14-1945 Referring Provider:   Flowsheet Row Cardiac Rehab from 02/19/2024 in [facility] Cardiac and Pulmonary Rehab  Referring Provider Dr. Wilbert Bihari, MD    Encounter Date: 06/26/2024  Check In:  Session Check In - 06/26/24 0930       Check-In   Supervising physician immediately available to respond to emergencies See telemetry face sheet for immediately available ER MD    Location [facility]-Cardiac & Pulmonary Rehab    Staff Present Leita Franks RN,BSN;Joseph [facility] BS, Exercise Physiologist;Laureen Delores, BS, RRT, CPFT    Virtual Visit No    Medication changes reported     No    Fall or balance concerns reported    No    Tobacco Cessation No Change    Warm-up and Cool-down Performed on first and last piece of equipment    Resistance Training Performed Yes    VAD Patient? No    PAD/SET Patient? No      Pain Assessment   Currently in Pain? No/denies             Tobacco Use History[1]  Goals Met:  Independence with exercise equipment Exercise tolerated well No report of concerns or symptoms today Strength training completed today  Goals Unmet:  Not Applicable  Comments: Pt able to follow exercise prescription today without complaint.  Will continue to monitor for progression.    Dr. Oneil Pinal is Medical Director for [facility] Cardiac Rehabilitation.  Dr. Fuad Aleskerov is Medical Director for [facility] Pulmonary Rehabilitation.    [1]  Social History Tobacco Use  Smoking Status Never  Smokeless Tobacco Never

## 2024-06-29 ENCOUNTER — Encounter: Admitting: *Deleted

## 2024-06-29 DIAGNOSIS — I214 Non-ST elevation (NSTEMI) myocardial infarction: Secondary | ICD-10-CM

## 2024-06-29 DIAGNOSIS — Z951 Presence of aortocoronary bypass graft: Secondary | ICD-10-CM

## 2024-06-29 NOTE — Progress Notes (Signed)
 Daily Session Note  Patient Details  Name: Daniel Holden MRN: [id number] Date of Birth: Oct 09, 1945 Referring Provider:   Flowsheet Row Cardiac Rehab from 02/19/2024 in [facility] Cardiac and Pulmonary Rehab  Referring Provider Dr. Wilbert Bihari, MD    Encounter Date: 06/29/2024  Check In:  Session Check In - 06/29/24 0933       Check-In   Supervising physician immediately available to respond to emergencies See telemetry face sheet for immediately available ER MD    Location [facility]-Cardiac & Pulmonary Rehab    Staff Present Othel Durand, RN, BSN, CCRP;Joseph Hood RCP,RRT,BSRT;Maxon [place] BS, Exercise Physiologist;Kelly Dyane BS, ACSM CEP, Exercise Physiologist    Virtual Visit No    Medication changes reported     No    Warm-up and Cool-down Performed on first and last piece of equipment    Resistance Training Performed Yes    VAD Patient? No    PAD/SET Patient? No      Pain Assessment   Currently in Pain? No/denies             Tobacco Use History[1]  Goals Met:  Independence with exercise equipment Exercise tolerated well No report of concerns or symptoms today  Goals Unmet:  Not Applicable  Comments: Pt able to follow exercise prescription today without complaint.  Will continue to monitor for progression.    Dr. Oneil Pinal is Medical Director for [facility] Cardiac Rehabilitation.  Dr. Fuad Aleskerov is Medical Director for [facility] Pulmonary Rehabilitation.    [1]  Social History Tobacco Use  Smoking Status Never  Smokeless Tobacco Never

## 2024-06-30 ENCOUNTER — Other Ambulatory Visit: Payer: Self-pay | Admitting: Internal Medicine

## 2024-06-30 DIAGNOSIS — R972 Elevated prostate specific antigen [PSA]: Secondary | ICD-10-CM

## 2024-07-01 ENCOUNTER — Other Ambulatory Visit (HOSPITAL_COMMUNITY): Payer: Self-pay

## 2024-07-01 ENCOUNTER — Encounter: Admitting: Emergency Medicine

## 2024-07-01 DIAGNOSIS — I214 Non-ST elevation (NSTEMI) myocardial infarction: Secondary | ICD-10-CM

## 2024-07-01 DIAGNOSIS — Z951 Presence of aortocoronary bypass graft: Secondary | ICD-10-CM

## 2024-07-01 NOTE — Telephone Encounter (Signed)
 Filled 06/30/24 with new year- will follow up if he decides he needs assistance

## 2024-07-01 NOTE — Progress Notes (Signed)
 Daily Session Note  Patient Details  Name: Daniel Holden MRN: [id number] Date of Birth: 02/13/1946 Referring Provider:   Flowsheet Row Cardiac Rehab from 02/19/2024 in [facility] Cardiac and Pulmonary Rehab  Referring Provider Dr. Wilbert Bihari, MD    Encounter Date: 07/01/2024  Check In:  Session Check In - 07/01/24 0937       Check-In   Supervising physician immediately available to respond to emergencies See telemetry face sheet for immediately available ER MD    Location [facility]-Cardiac & Pulmonary Rehab    Staff Present Leita Franks RN,BSN;Joseph [facility] BS, Exercise Physiologist;Margaret Best, MS, Exercise Physiologist    Virtual Visit No    Medication changes reported     No    Fall or balance concerns reported    No    Tobacco Cessation No Change    Warm-up and Cool-down Performed on first and last piece of equipment    Resistance Training Performed Yes    VAD Patient? No    PAD/SET Patient? No      Pain Assessment   Currently in Pain? No/denies             Tobacco Use History[1]  Goals Met:  Independence with exercise equipment Exercise tolerated well No report of concerns or symptoms today Strength training completed today  Goals Unmet:  Not Applicable  Comments: Pt able to follow exercise prescription today without complaint.  Will continue to monitor for progression.    Dr. Oneil Pinal is Medical Director for [facility] Cardiac Rehabilitation.  Dr. Fuad Aleskerov is Medical Director for [facility] Pulmonary Rehabilitation.    [1]  Social History Tobacco Use  Smoking Status Never  Smokeless Tobacco Never

## 2024-07-03 ENCOUNTER — Encounter: Admitting: Emergency Medicine

## 2024-07-03 DIAGNOSIS — Z951 Presence of aortocoronary bypass graft: Secondary | ICD-10-CM

## 2024-07-03 DIAGNOSIS — I214 Non-ST elevation (NSTEMI) myocardial infarction: Secondary | ICD-10-CM

## 2024-07-03 NOTE — Progress Notes (Signed)
 Daily Session Note  Patient Details  Name: Daniel Holden MRN: [id number] Date of Birth: [birth year] Referring Provider:   Flowsheet Row Cardiac Rehab from 02/19/2024 in [facility] Cardiac and Pulmonary Rehab  Referring Provider Dr. Wilbert Bihari, MD    Encounter Date: 07/03/2024  Check In:  Session Check In - 07/03/24 0907       Check-In   Supervising physician immediately available to respond to emergencies See telemetry face sheet for immediately available ER MD    Location [facility]-Cardiac & Pulmonary Rehab    Staff Present Leita Franks RN,BSN;Joseph [facility] BS, Exercise Physiologist;Noah Tickle, BS, Exercise Physiologist    Virtual Visit No    Medication changes reported     No    Fall or balance concerns reported    No    Tobacco Cessation No Change    Warm-up and Cool-down Performed on first and last piece of equipment    Resistance Training Performed Yes    VAD Patient? No    PAD/SET Patient? No      Pain Assessment   Currently in Pain? No/denies             Tobacco Use History[1]  Goals Met:  Independence with exercise equipment Exercise tolerated well No report of concerns or symptoms today Strength training completed today  Goals Unmet:  Not Applicable  Comments: Pt able to follow exercise prescription today without complaint.  Will continue to monitor for progression.    Dr. Oneil Pinal is Medical Director for [facility] Cardiac Rehabilitation.  Dr. Fuad Aleskerov is Medical Director for [facility] Pulmonary Rehabilitation.    [1]  Social History Tobacco Use  Smoking Status Never  Smokeless Tobacco Never

## 2024-07-06 ENCOUNTER — Encounter

## 2024-07-06 DIAGNOSIS — Z951 Presence of aortocoronary bypass graft: Secondary | ICD-10-CM

## 2024-07-06 DIAGNOSIS — I214 Non-ST elevation (NSTEMI) myocardial infarction: Secondary | ICD-10-CM

## 2024-07-06 NOTE — Progress Notes (Signed)
 Cardiac Individual Treatment Plan  Patient Details  Name: Daniel Holden MRN: [id number] Date of Birth: 1945/08/30 Referring Provider:   Flowsheet Row Cardiac Rehab from 02/19/2024 in [facility] Cardiac and Pulmonary Rehab  Referring Provider Dr. Wilbert Bihari, MD    Initial Encounter Date:  Flowsheet Row Cardiac Rehab from 02/19/2024 in [facility] Cardiac and Pulmonary Rehab  Date 02/19/24    Visit Diagnosis: S/P CABG x 3  NSTEMI (non-ST elevated myocardial infarction) [facility])  Patient's Home Medications on Admission: Current Medications[1]  Past Medical History: Past Medical History:  Diagnosis Date   Allergy    Angina of effort    Anxiety    CAD (coronary artery disease), native coronary artery    s/p cath 12/2023 CTO of RCA, 95% LAD, 90% LCx and echo was normal.  He underwent CABG with LIMA-LAD, SVG-PL and a left radial-OM .   Cancer [facility])    Carpal tunnel syndrome    GERD (gastroesophageal reflux disease)    HLD (hyperlipidemia)    Hypertension    Lung nodule    Obesity    PAF (paroxysmal atrial fibrillation) (HCC) 12/2023   s/p MAZE and LA clippling   Pancreatitis 2015   Sleep apnea     Tobacco Use: Tobacco Use History[2]  Labs: Review Flowsheet  More data exists      Latest Ref Rng & Units 11/07/2020 03/19/2021 01/02/2024 01/05/2024 01/06/2024  Labs for ITP Cardiac and Pulmonary Rehab  Cholestrol 0 - 200 mg/dL 895  - 880  - -  LDL (calc) 0 - 99 mg/dL 26  - 33  - -  HDL-C >59 mg/dL 58  - 60  - -  Trlycerides <150 mg/dL 888  - 867  - -  Hemoglobin A1c 4.8 - 5.6 % - 6.6  6.1  - -  PH, Arterial 7.35 - 7.45 - - - 7.44  7.410  7.362  7.347  7.372  7.425  7.347  7.319   PCO2 arterial 32 - 48 mmHg - - - 31  32.1  33.2  38.9  37.6  35.7  34.9  41.1   Bicarbonate 20.0 - 28.0 mmol/L - - - 21.1  20.3  18.9  21.7  21.8  23.4  19.1  21.1   TCO2 22 - 32 mmol/L - - - - 21  20  23  22  23  25  24  24  20  22  21  22    Acid-base deficit 0.0 - 2.0 mmol/L - - - 2.1  4.0  6.0  4.0  3.0  1.0   6.0  5.0   O2 Saturation % - - - 99.4  98  99  97  99  100  100  100     Details       Multiple values from one day are sorted in reverse-chronological order          Exercise Target Goals: Exercise Program Goal: Individual exercise prescription set using results from initial 6 min walk test and THRR while considering  patients activity barriers and safety.   Exercise Prescription Goal: Initial exercise prescription builds to 30-45 minutes a day of aerobic activity, 2-3 days per week.  Home exercise guidelines will be given to patient during program as part of exercise prescription that the participant will acknowledge.   Education: Aerobic Exercise: - Group verbal and visual presentation on the components of exercise prescription. Introduces F.I.T.T principle from ACSM for exercise prescriptions.  Reviews F.I.T.T. principles of  aerobic exercise including progression. Written material provided at class time. Flowsheet Row Cardiac Rehab from 05/24/2021 in [facility] Cardiac and Pulmonary Rehab  Education need identified 03/09/21    Education: Resistance Exercise: - Group verbal and visual presentation on the components of exercise prescription. Introduces F.I.T.T principle from ACSM for exercise prescriptions  Reviews F.I.T.T. principles of resistance exercise including progression. Written material provided at class time. Flowsheet Row Cardiac Rehab from 05/24/2021 in [facility] Cardiac and Pulmonary Rehab  Date 04/26/21  Educator [facility]  Instruction Review Code 1- Verbalizes Understanding     Education: Exercise & Equipment Safety: - Individual verbal instruction and demonstration of equipment use and safety with use of the equipment. Flowsheet Row Cardiac Rehab from 07/01/2024 in [facility] Cardiac and Pulmonary Rehab  Date 02/19/24  Educator NT  Instruction Review Code 1- Verbalizes Understanding    Education: Exercise Physiology & General Exercise Guidelines: - Group verbal and written  instruction with models to review the exercise physiology of the cardiovascular system and associated critical values. Provides general exercise guidelines with specific guidelines to those with heart or lung disease. Written material provided at class time. Flowsheet Row Cardiac Rehab from 05/24/2021 in [facility] Cardiac and Pulmonary Rehab  Date 04/12/21  Educator [facility]  Instruction Review Code 1- Verbalizes Understanding    Education: Flexibility, Balance, Mind/Body Relaxation: - Group verbal and visual presentation with interactive activity on the components of exercise prescription. Introduces F.I.T.T principle from ACSM for exercise prescriptions. Reviews F.I.T.T. principles of flexibility and balance exercise training including progression. Also discusses the mind body connection.  Reviews various relaxation techniques to help reduce and manage stress (i.e. Deep breathing, progressive muscle relaxation, and visualization). Balance handout provided to take home. Written material provided at class time. Flowsheet Row Cardiac Rehab from 05/24/2021 in [facility] Cardiac and Pulmonary Rehab  Date 05/03/21  Educator AS  Instruction Review Code 1- Verbalizes Understanding    Activity Barriers & Risk Stratification:  Activity Barriers & Cardiac Risk Stratification - 02/14/24 1033       Activity Barriers & Cardiac Risk Stratification   Activity Barriers Left Knee Replacement;Joint Problems;Back Problems    Cardiac Risk Stratification High          6 Minute Walk:  6 Minute Walk     Row Name 02/19/24 1143 06/24/24 0935       6 Minute Walk   Phase Initial Discharge    Distance 1195 feet 1415 feet    Distance % Change -- 18.41 %    Distance Feet Change -- 220 ft    Walk Time 6 minutes 6 minutes    # of Rest Breaks 0 0    MPH 2.26 2.68    METS 2.26 2.7    RPE 10 13    Perceived Dyspnea  1 0    VO2 Peak 7.92 9.45    Symptoms No No    Resting HR 76 bpm 77 bpm    Resting BP 142/68 130/60     Resting Oxygen Saturation  96 % 96 %    Exercise Oxygen Saturation  during 6 min walk 96 % 97 %    Max Ex. HR 96 bpm 100 bpm    Max Ex. BP 160/72 160/60    2 Minute Post BP 154/72 150/70       Oxygen Initial Assessment:   Oxygen Re-Evaluation:   Oxygen Discharge (Final Oxygen Re-Evaluation):   Initial Exercise Prescription:  Initial Exercise Prescription - 02/19/24 1100  Date of Initial Exercise RX and Referring Provider   Date 02/19/24    Referring Provider Dr. Wilbert Bihari, MD      Oxygen   Maintain Oxygen Saturation 88% or higher      Treadmill   MPH 2.2    Grade 0    Minutes 15    METs 2.68      REL-XR   Level 3    Speed 50    Minutes 15    METs 2.26      Biostep-RELP   Level 2    SPM 50    Minutes 15    METs 2.26      Track   Laps 26    Minutes 15    METs 2.41      Prescription Details   Frequency (times per week) 3    Duration Progress to 30 minutes of continuous aerobic without signs/symptoms of physical distress      Intensity   THRR 40-80% of Max Heartrate 103-131    Ratings of Perceived Exertion 11-13    Perceived Dyspnea 0-4      Progression   Progression Continue to progress workloads to maintain intensity without signs/symptoms of physical distress.      Resistance Training   Training Prescription Yes    Weight 5 lb    Reps 10-15          Perform Capillary Blood Glucose checks as needed.  Exercise Prescription Changes:   Exercise Prescription Changes     Row Name 02/19/24 1100 03/03/24 0800 03/19/24 1600 03/31/24 1400 04/15/24 0900     Response to Exercise   Blood Pressure (Admit) 142/68 114/60 124/80 106/60 --   Blood Pressure (Exercise) 160/72 158/70 162/70 152/60 --   Blood Pressure (Exit) 154/72 118/62 124/70 122/62 --   Heart Rate (Admit) 76 bpm 81 bpm 77 bpm 78 bpm --   Heart Rate (Exercise) 96 bpm 107 bpm 104 bpm 108 bpm --   Heart Rate (Exit) 89 bpm 91 bpm 90 bpm 87 bpm --   Oxygen Saturation (Admit) 96 %  -- -- -- --   Oxygen Saturation (Exercise) 96 % -- -- -- --   Rating of Perceived Exertion (Exercise) 10 13 14 13  --   Perceived Dyspnea (Exercise) 1 -- -- -- --   Symptoms none none none none --   Comments Results 1st 2 weeks of exercise sessions -- -- --   Duration -- Progress to 30 minutes of  aerobic without signs/symptoms of physical distress Progress to 30 minutes of  aerobic without signs/symptoms of physical distress Continue with 30 min of aerobic exercise without signs/symptoms of physical distress. Continue with 30 min of aerobic exercise without signs/symptoms of physical distress.   Intensity -- THRR unchanged THRR unchanged THRR unchanged THRR unchanged     Progression   Progression -- Continue to progress workloads to maintain intensity without signs/symptoms of physical distress. Continue to progress workloads to maintain intensity without signs/symptoms of physical distress. Continue to progress workloads to maintain intensity without signs/symptoms of physical distress. Continue to progress workloads to maintain intensity without signs/symptoms of physical distress.   Average METs -- 2.86 3.57 3.29 3.29     Resistance Training   Training Prescription -- Yes Yes Yes Yes   Weight -- 5 lb 5 lb 5 lb 5 lb   Reps -- 10-15 10-15 10-15 10-15     Interval Training   Interval Training -- No No No No  Treadmill   MPH -- 2.4 2.5 2.5 2.5   Grade -- 0 0 0 0   Minutes -- 15 15 15 15    METs -- 2.84 2.91 2.91 2.91     REL-XR   Level -- 4 7 4 4    Minutes -- 15 15 15 15    METs -- -- 5.4 5 5      Biostep-RELP   Level -- 3 3 3 3    Minutes -- 15 15 15 15    METs -- 3 3 3 3      Home Exercise Plan   Plans to continue exercise at -- -- -- -- Home (comment)  RB at home and possibliy join [employer]   Frequency -- -- -- -- Add 2 additional days to program exercise sessions.   Initial Home Exercises Provided -- -- -- -- 04/15/24     Oxygen   Maintain Oxygen Saturation --  88% or higher 88% or higher 88% or higher 88% or higher    Row Name 04/28/24 1300 05/26/24 1400 06/10/24 1100 06/24/24 1500       Response to Exercise   Blood Pressure (Admit) 126/76 132/60 118/62 122/64    Blood Pressure (Exercise) 148/80 -- -- --    Blood Pressure (Exit) 122/74 114/60 116/60 108/60    Heart Rate (Admit) 71 bpm 79 bpm 77 bpm 79 bpm    Heart Rate (Exercise) 110 bpm 107 bpm 101 bpm 115 bpm    Heart Rate (Exit) 84 bpm 87 bpm 83 bpm 77 bpm    Rating of Perceived Exertion (Exercise) 14 13 14 13     Symptoms none none none none    Duration Continue with 30 min of aerobic exercise without signs/symptoms of physical distress. Continue with 30 min of aerobic exercise without signs/symptoms of physical distress. Continue with 30 min of aerobic exercise without signs/symptoms of physical distress. Continue with 30 min of aerobic exercise without signs/symptoms of physical distress.    Intensity THRR unchanged THRR unchanged THRR unchanged THRR unchanged      Progression   Progression Continue to progress workloads to maintain intensity without signs/symptoms of physical distress. Continue to progress workloads to maintain intensity without signs/symptoms of physical distress. Continue to progress workloads to maintain intensity without signs/symptoms of physical distress. Continue to progress workloads to maintain intensity without signs/symptoms of physical distress.    Average METs 3.26 2.92 2.63 2.94      Resistance Training   Training Prescription Yes Yes -- --    Weight 5 lb 5 lb 5 lb 5 lb    Reps 10-15 10-15 10-15 10-15      Interval Training   Interval Training No No No No      Treadmill   MPH 2.6 2.4 2.4 2.5    Grade 0 0 0 0    Minutes 15 15 15 15     METs 2.99 2.84 2.84 2.91      NuStep   Level 4 -- 4  T6 nustep 4  T6 nustep    Minutes 15 -- 15 15    METs 3.4 -- 3.1 3.2      REL-XR   Level 4 -- 3 4    Minutes 15 -- 15 15    METs 5.1 -- -- 3.6      T5 Nustep    Level -- -- 2 --    Minutes -- -- 15 --    METs -- -- 2.2 --  Biostep-RELP   Level 3 3 3  --    Minutes 15 15 15  --    METs 3 3 2.2 --      Home Exercise Plan   Plans to continue exercise at Home (comment)  RB at home and possibliy join [employer] Home (comment)  RB at home and possibliy join [employer] Home (comment)  RB at home and possibliy join [employer] Home (comment)  RB at home and possibliy join [employer]    Frequency Add 2 additional days to program exercise sessions. Add 2 additional days to program exercise sessions. Add 2 additional days to program exercise sessions. Add 2 additional days to program exercise sessions.    Initial Home Exercises Provided 04/15/24 04/15/24 04/15/24 04/15/24      Oxygen   Maintain Oxygen Saturation 88% or higher 88% or higher 88% or higher 88% or higher       Exercise Comments:   Exercise Comments     Row Name 02/21/24 9071 07/06/24 0950         Exercise Comments First full day of exercise!  Patient was oriented to gym and equipment including functions, settings, policies, and procedures.  Patient's individual exercise prescription and treatment plan were reviewed.  All starting workloads were established based on the results of the 6 minute walk test done at initial orientation visit.  The plan for exercise progression was also introduced and progression will be customized based on patient's performance and goals. Howie graduated today from  rehab with 36 sessions completed.  Details of the patient's exercise prescription and what He needs to do in order to continue the prescription and progress were discussed with patient.  Patient was given a copy of prescription and goals.  Patient verbalized understanding. Karas plans to continue to exercise by riding his recumbent bike and possibly joining [employer].         Exercise Goals and Review:   Exercise Goals     Row Name 02/19/24 1157              Exercise Goals   Increase Physical Activity Yes       Intervention Provide advice, education, support and counseling about physical activity/exercise needs.;Develop an individualized exercise prescription for aerobic and resistive training based on initial evaluation findings, risk stratification, comorbidities and participant's personal goals.       Expected Outcomes Short Term: Attend rehab on a regular basis to increase amount of physical activity.;Long Term: Add in home exercise to make exercise part of routine and to increase amount of physical activity.;Long Term: Exercising regularly at least 3-5 days a week.       Increase Strength and Stamina Yes       Intervention Provide advice, education, support and counseling about physical activity/exercise needs.;Develop an individualized exercise prescription for aerobic and resistive training based on initial evaluation findings, risk stratification, comorbidities and participant's personal goals.       Expected Outcomes Short Term: Increase workloads from initial exercise prescription for resistance, speed, and METs.;Short Term: Perform resistance training exercises routinely during rehab and add in resistance training at home;Long Term: Improve cardiorespiratory fitness, muscular endurance and strength as measured by increased METs and functional capacity ( )       Able to understand and use rate of perceived exertion (RPE) scale Yes       Intervention Provide education and explanation on how to use RPE scale       Expected Outcomes Short Term: Able  to use RPE daily in rehab to express subjective intensity level;Long Term:  Able to use RPE to guide intensity level when exercising independently       Able to understand and use Dyspnea scale Yes       Intervention Provide education and explanation on how to use Dyspnea scale       Expected Outcomes Short Term: Able to use Dyspnea scale daily in rehab to express subjective sense of shortness of breath  during exertion;Long Term: Able to use Dyspnea scale to guide intensity level when exercising independently       Knowledge and understanding of Target Heart Rate Range (THRR) Yes       Intervention Provide education and explanation of THRR including how the numbers were predicted and where they are located for reference       Expected Outcomes Short Term: Able to state/look up THRR;Long Term: Able to use THRR to govern intensity when exercising independently;Short Term: Able to use daily as guideline for intensity in rehab       Able to check pulse independently Yes       Intervention Provide education and demonstration on how to check pulse in carotid and radial arteries.;Review the importance of being able to check your own pulse for safety during independent exercise       Expected Outcomes Short Term: Able to explain why pulse checking is important during independent exercise;Long Term: Able to check pulse independently and accurately       Understanding of Exercise Prescription Yes       Intervention Provide education, explanation, and written materials on patient's individual exercise prescription       Expected Outcomes Short Term: Able to explain program exercise prescription;Long Term: Able to explain home exercise prescription to exercise independently          Exercise Goals Re-Evaluation :  Exercise Goals Re-Evaluation     Row Name 02/21/24 9071 03/03/24 0815 03/16/24 0936 03/19/24 1643 03/31/24 1443     Exercise Goal Re-Evaluation   Exercise Goals Review Increase Physical Activity;Able to understand and use rate of perceived exertion (RPE) scale;Knowledge and understanding of Target Heart Rate Range (THRR);Understanding of Exercise Prescription;Increase Strength and Stamina;Able to understand and use Dyspnea scale;Able to check pulse independently Increase Physical Activity;Understanding of Exercise Prescription;Increase Strength and Stamina Increase Physical Activity;Increase  Strength and Stamina;Understanding of Exercise Prescription Increase Physical Activity;Increase Strength and Stamina;Understanding of Exercise Prescription Increase Physical Activity;Increase Strength and Stamina;Understanding of Exercise Prescription   Comments Reviewed RPE and dyspnea scale, THR and program prescription with pt today.  Pt voiced understanding and was given a copy of goals to take home. Dick is off to a good start in the program and completed his first 2 weeks in this review. He worked at level 4 on the XR and level 3 on the biostep. He had a workload on the treadmill of a speed of 2.4 mph with no incline. We will continue to monitor his progress in the program. Dick is doing well in rehab. He is walking some at home on days he is not attending rehab. His wife has black card for [employer] and sometimes he will go with her to work on his core. Dick continues to do well in rehab. He was recently able to increase his level on the XR from 4 to 7. He was also able to increase his speed on the treadmill from 2.4mph to 2.5mph. We will continue to monitor his progress in the  program. Dick continues to do well in rehab. He continues to walk on the treadmill at a speed of 2.5 mph with no incline. He also has consistently worked at level 2 on the biostep. We will continue to monitor his progress in the program.   Expected Outcomes Short: Use RPE daily to regulate intensity. Long: Follow program prescription in THR. Short: Continue to follow current exercise prescription. Long: Continue exercise to improve strength and stamina. STG: Continue to attend rehab and incorperate more home exercise as able. LTG: Continue exercise to improve strength and stamina. Short: Continue to increase treadmill workloads. Long: Continue exercise to improve strength and stamina. Short: Continue to progressively increase workloads. Long: Continue exercise to improve strength and stamina.    Row Name 04/15/24 9044 04/17/24  0951 04/28/24 1349 05/14/24 1039 05/26/24 1403     Exercise Goal Re-Evaluation   Exercise Goals Review Increase Physical Activity;Able to understand and use rate of perceived exertion (RPE) scale;Knowledge and understanding of Target Heart Rate Range (THRR);Understanding of Exercise Prescription;Increase Strength and Stamina;Able to understand and use Dyspnea scale;Able to check pulse independently Increase Physical Activity;Increase Strength and Stamina;Understanding of Exercise Prescription Increase Physical Activity;Increase Strength and Stamina;Understanding of Exercise Prescription Increase Physical Activity;Increase Strength and Stamina;Understanding of Exercise Prescription Increase Physical Activity;Increase Strength and Stamina;Understanding of Exercise Prescription   Comments Reviewed home exercise with pt today.  Pt plans to use his RB at home and possibly join [employer] for exercise.  Reviewed THR, pulse, RPE, sign and symptoms, pulse oximetery and when to call 911 or MD.  Also discussed weather considerations and indoor options.  Pt voiced understanding. Dick did not attend rehab during the last review period. He has since returned to the program and has attended three sessions. We will continue to monitor his progress in the program. Dick is doing well in rehab. He recently increased his speed to 2.6 mph with no incline. He also began using the T4 nustep at level 4 and continues to use 5 lb hand weights for resistance training. We will continue to monitor his progress in the program. Dick is currently out on medical hold. He is going to physical therapy for his spine for a few weeks. We will continue to monitor his progress when he returns to the program. Dick returned to rehab for one session after being out on medical hold. He did well on the treadmill at a speed of 2.4 mph with no incline. He also worked at level 3 on the biostep. We will continue to monitor his progress in the program.    Expected Outcomes Short: add 1-2 days a week or exercise on off days of rehab. Long: maintain independent exercise routine upon graduation from cardiac rehab. Short: Continue to attend rehab regularly. Long: Continue exercise to improve strength and stamina. Short: Continue to progressively increase treadmill workload. Long: Continue exercise to improve strength and stamina. Short: Return to rehab when appropriate. Long: Graduate. Short: Progressively increase back up to previous workloads. Long: Continue exercise to improve strength and stamina.    Row Name 06/10/24 1120 06/24/24 1539 07/03/24 0942         Exercise Goal Re-Evaluation   Exercise Goals Review Increase Physical Activity;Increase Strength and Stamina;Understanding of Exercise Prescription Increase Physical Activity;Increase Strength and Stamina;Understanding of Exercise Prescription Increase Physical Activity;Increase Strength and Stamina;Understanding of Exercise Prescription     Comments Dick is doing well in rehab. He continues to walk on the treadmill at 2.4 mph with no incline. He  also has worked at level 4 on the T6 nustep and level 3 on both the XR and biostep. We will continue to monitor his progress in the program. Dick is doing well in rehab. He increased his speed on the treadmill to 2.5 mph with no incline. He also has worked at level 4 on both the T6 nustep and the XR. We will continue to monitor his progress in the program. Dick states that he has not been doing any aerobic exercise outside of rehab. However, he does own a recumbent bike and some hand weights at home. He plans to begin exercising at home once he graduates from rehab. He states he has enjoyed the rehab program and notices a difference in his stamina.     Expected Outcomes Short: Continue to progressively increase workloads. Long: Continue exercise to improve strength and stamina. Short: Continue to progressively increase workloads. Long: Continue exercise to  improve strength and stamina. Short: Graduate. Long: Continue to exercise independently.        Discharge Exercise Prescription (Final Exercise Prescription Changes):  Exercise Prescription Changes - 06/24/24 1500       Response to Exercise   Blood Pressure (Admit) 122/64    Blood Pressure (Exit) 108/60    Heart Rate (Admit) 79 bpm    Heart Rate (Exercise) 115 bpm    Heart Rate (Exit) 77 bpm    Rating of Perceived Exertion (Exercise) 13    Symptoms none    Duration Continue with 30 min of aerobic exercise without signs/symptoms of physical distress.    Intensity THRR unchanged      Progression   Progression Continue to progress workloads to maintain intensity without signs/symptoms of physical distress.    Average METs 2.94      Resistance Training   Weight 5 lb    Reps 10-15      Interval Training   Interval Training No      Treadmill   MPH 2.5    Grade 0    Minutes 15    METs 2.91      NuStep   Level 4   T6 nustep   Minutes 15    METs 3.2      REL-XR   Level 4    Minutes 15    METs 3.6      Home Exercise Plan   Plans to continue exercise at Home (comment)   RB at home and possibliy join [employer]   Frequency Add 2 additional days to program exercise sessions.    Initial Home Exercises Provided 04/15/24      Oxygen   Maintain Oxygen Saturation 88% or higher          Nutrition:  Target Goals: Understanding of nutrition guidelines, daily intake of sodium 1500mg , cholesterol 200mg , calories 30% from fat and 7% or less from saturated fats, daily to have 5 or more servings of fruits and vegetables.  Education: Nutrition 1 -Group instruction provided by verbal, written material, interactive activities, discussions, models, and posters to present general guidelines for heart healthy nutrition including macronutrients, label reading, and promoting whole foods over processed counterparts. Education serves as pensions consultant of discussion of heart healthy eating  for all. Written material provided at class time.    Education: Nutrition 2 -Group instruction provided by verbal, written material, interactive activities, discussions, models, and posters to present general guidelines for heart healthy nutrition including sodium, cholesterol, and saturated fat. Providing guidance of habit forming to improve blood pressure, cholesterol,  and body weight. Written material provided at class time.     Biometrics:  Pre Biometrics - 02/19/24 1157       Pre Biometrics   Height 5' 6.5 (1.689 m)    Weight 210 lb 3.2 oz (95.3 kg)    Waist Circumference 42.5 inches    Hip Circumference 42 inches    Waist to Hip Ratio 1.01 %    BMI (Calculated) 33.42    Single Leg Stand 30 seconds          Post Biometrics - 06/24/24 9062        Post  Biometrics   Height 5' 6.5 (1.689 m)    Weight 209 lb 6.4 oz (95 kg)    Waist Circumference 45.5 inches    Hip Circumference 45 inches    Waist to Hip Ratio 1.01 %    BMI (Calculated) 33.3    Single Leg Stand 30 seconds          Nutrition Therapy Plan and Nutrition Goals:  Nutrition Therapy & Goals - 02/19/24 1141       Nutrition Therapy   RD appointment deferred Yes      Intervention Plan   Intervention Prescribe, educate and counsel regarding individualized specific dietary modifications aiming towards targeted core components such as weight, hypertension, lipid management, diabetes, heart failure and other comorbidities.    Expected Outcomes Short Term Goal: Understand basic principles of dietary content, such as calories, fat, sodium, cholesterol and nutrients.;Short Term Goal: A plan has been developed with personal nutrition goals set during dietitian appointment.;Long Term Goal: Adherence to prescribed nutrition plan.          Nutrition Assessments:  MEDIFICTS Score Key: >=70 Need to make dietary changes  40-70 Heart Healthy Diet <= 40 Therapeutic Level Cholesterol Diet  Flowsheet Row Cardiac  Rehab from 06/26/2024 in [facility] Cardiac and Pul[facility] Rehab  Picture Your Plate Total Score on Admission 66  Picture Your Plate Total Score on Discharge 59   Picture Your Plate Scores: <59 Unhealthy dietary pattern with much room for improvement. 41-50 Dietary pattern unlikely to meet recommendations for good health and room for improvement. 51-60 More healthful dietary pattern, with some room for improvement.  >60 Healthy dietary pattern, although there may be some specific behaviors that could be improved.    Nutrition Goals Re-Evaluation:  Nutrition Goals Re-Evaluation     Row Name 03/16/24 [phone] 04/13/24 0931 06/01/24 0929 07/03/24 0936       Goals   Comment Dick deferred RD appointment Dick deferred RD appointment Dick deferred RD appointment Dick deferred RD appointment       Nutrition Goals Discharge (Final Nutrition Goals Re-Evaluation):  Nutrition Goals Re-Evaluation - 07/03/24 0936       Goals   Comment Dick deferred RD appointment          Psychosocial: Target Goals: Acknowledge presence or absence of significant depression and/or stress, maximize coping skills, provide positive support system. Participant is able to verbalize types and ability to use techniques and skills needed for reducing stress and depression.   Education: Stress, Anxiety, and Depression - Group verbal and visual presentation to define topics covered.  Reviews how body is impacted by stress, anxiety, and depression.  Also discusses healthy ways to reduce stress and to treat/manage anxiety and depression. Written material provided at class time. Flowsheet Row Cardiac Rehab from 07/01/2024 in [facility] Cardiac and Pulmonary Rehab  Education need identified 02/19/24    Education: Sleep Hygiene -Provides group  verbal and written instruction about how sleep can affect your health.  Define sleep hygiene, discuss sleep cycles and impact of sleep habits. Review good sleep hygiene tips.   Initial Review &  Psychosocial Screening:  Initial Psych Review & Screening - 02/14/24 1042       Initial Review   Current issues with Current Stress Concerns      Family Dynamics   Good Support System? Yes      Barriers   Psychosocial barriers to participate in program There are no identifiable barriers or psychosocial needs.      Screening Interventions   Interventions Encouraged to exercise;Provide feedback about the scores to participant;To provide support and resources with identified psychosocial needs    Expected Outcomes Short Term goal: Utilizing psychosocial counselor, staff and physician to assist with identification of specific Stressors or current issues interfering with healing process. Setting desired goal for each stressor or current issue identified.;Long Term Goal: Stressors or current issues are controlled or eliminated.;Short Term goal: Identification and review with participant of any Quality of Life or Depression concerns found by scoring the questionnaire.;Long Term goal: The participant improves quality of Life and PHQ9 Scores as seen by post scores and/or verbalization of changes          Quality of Life Scores:   Quality of Life - 06/26/24 0932       Quality of Life Scores   Health/Function Pre 23.8 %    Health/Function Post 25.97 %    Health/Function % Change 9.12 %    Socioeconomic Pre 25.63 %    Socioeconomic Post 30 %    Socioeconomic % Change  17.05 %    Psych/Spiritual Pre 26.64 %    Psych/Spiritual Post 28.14 %    Psych/Spiritual % Change 5.63 %    Family Pre 30 %    Family Post 30 %    Family % Change 0 %    GLOBAL Pre 25.67 %    GLOBAL Post 27.84 %    GLOBAL % Change 8.45 %         Scores of 19 and below usually indicate a poorer quality of life in these areas.  A difference of  2-3 points is a clinically meaningful difference.  A difference of 2-3 points in the total score of the Quality of Life Index has been associated with significant improvement in  overall quality of life, self-image, physical symptoms, and general health in studies assessing change in quality of life.  PHQ-9: Review Flowsheet  More data exists      06/26/2024 02/19/2024 05/26/2021 03/09/2021 01/23/2021  Depression screen PHQ 2/9  Decreased Interest 0 0 0 0 0  Down, Depressed, Hopeless 0 0 0 0 0  PHQ - 2 Score 0 0 0 0 0  Altered sleeping 0 0 0 0 -  Tired, decreased energy 0 2 1 1  -  Change in appetite 0 0 1 1 -  Feeling bad or failure about yourself  0 0 0 0 -  Trouble concentrating 0 0 0 0 -  Moving slowly or fidgety/restless 0 1 0 0 -  Suicidal thoughts 0 0 0 0 -  PHQ-9 Score 0 3  2  2   -  Difficult doing work/chores - Not difficult at all Not difficult at all Not difficult at all -    Details       Data saved with a previous flowsheet row definition        Interpretation of  Total Score  Total Score Depression Severity:  1-4 = Minimal depression, 5-9 = Mild depression, 10-14 = Moderate depression, 15-19 = Moderately severe depression, 20-27 = Severe depression   Psychosocial Evaluation and Intervention:  Psychosocial Evaluation - 02/14/24 1047       Psychosocial Evaluation & Interventions   Interventions Encouraged to exercise with the program and follow exercise prescription;Relaxation education    Comments 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 He states he has been walking consistently, but mentions some stress because he feels like he should be further along in his progress. He stays active and is ready to get involved in the program to increase his stamina. He started sleeping in his bed this past week and has been getting a consistent 8 hours which he is pleased with.    Expected Outcomes Short: attend cardiac rehab for education and exercise Long: develop and maintain positive self care habits    Continue Psychosocial Services  Follow up required by staff          Psychosocial Re-Evaluation:  Psychosocial Re-Evaluation      Row Name 03/16/24 [phone] 04/13/24 0932 06/01/24 0935 07/03/24 9060       Psychosocial Re-Evaluation   Current issues with None Identified None Identified None Identified None Identified    Comments Dick denies any stress, anxiety, or depression at this time. He reports he sleeps well and wakes well rested. Patient reports no issues with their current mental states, sleep, stress, depression or anxiety. Will follow up with patient in a few weeks for any changes. Earle mood has been good and has no concerns with his mental health. He is stressed slightly from his knee and back pain. He is awaiting his shots for his knee pain. Dick reports no major stressors at this time. His main stressor in the past was his lower back pain. He recently got an epidural shot that he reports has helped a lot and he states that this is the best he has felt in a long time. He reports no issues with sleep and is getting about 8-9 hours of sleep per night.    Expected Outcomes STG: Continue to attend rehab and focus on good sleep. LTG: Achieve and maintain postivie outlook on health and daily life Short: Continue to exercise regularly to support mental health and notify staff of any changes. Long: maintain mental health and well being through teaching of rehab or prescribed medications independently. Short: get shots for knee pain to help mood. Long: maintain mental health independently Short: Continue to exercise for mental boost, Long: Maintain positive outlook.    Interventions Encouraged to attend Cardiac Rehabilitation for the exercise Encouraged to attend Cardiac Rehabilitation for the exercise -- Encouraged to attend Cardiac Rehabilitation for the exercise    Continue Psychosocial Services  Follow up required by staff Follow up required by staff Follow up required by staff Follow up required by staff       Psychosocial Discharge (Final Psychosocial Re-Evaluation):  Psychosocial Re-Evaluation - 07/03/24 0939        Psychosocial Re-Evaluation   Current issues with None Identified    Comments Dick reports no major stressors at this time. His main stressor in the past was his lower back pain. He recently got an epidural shot that he reports has helped a lot and he states that this is the best he has felt in a long time. He reports no issues with sleep and is getting about 8-9  hours of sleep per night.    Expected Outcomes Short: Continue to exercise for mental boost, Long: Maintain positive outlook.    Interventions Encouraged to attend Cardiac Rehabilitation for the exercise    Continue Psychosocial Services  Follow up required by staff          Vocational Rehabilitation: Provide vocational rehab assistance to qualifying candidates.   Vocational Rehab Evaluation & Intervention:  Vocational Rehab - 02/14/24 1042       Initial Vocational Rehab Evaluation & Intervention   Assessment shows need for Vocational Rehabilitation No          Education: Education Goals: Education classes will be provided on a variety of topics geared toward better understanding of heart health and risk factor modification. Participant will state understanding/return demonstration of topics presented as noted by education test scores.  Learning Barriers/Preferences:  Learning Barriers/Preferences - 02/14/24 1040       Learning Barriers/Preferences   Learning Barriers None    Learning Preferences Individual Instruction          General Cardiac Education Topics:  AED/CPR: - Group verbal and written instruction with the use of models to demonstrate the basic use of the AED with the basic ABC's of resuscitation.   Test and Procedures: - Group verbal and visual presentation and models provide information about basic cardiac anatomy and function. Reviews the testing methods done to diagnose heart disease and the outcomes of the test results. Describes the treatment choices: Medical Management, Angioplasty, or  Coronary Bypass Surgery for treating various heart conditions including Myocardial Infarction, Angina, Valve Disease, and Cardiac Arrhythmias. Written material provided at class time. Flowsheet Row Cardiac Rehab from 07/01/2024 in [facility] Cardiac and Pulmonary Rehab  Education need identified 02/19/24  Date 07/01/24  Educator kb  Instruction Review Code 1- Verbalizes Understanding    Medication Safety: - Group verbal and visual instruction to review commonly prescribed medications for heart and lung disease. Reviews the medication, class of the drug, and side effects. Includes the steps to properly store meds and maintain the prescription regimen. Written material provided at class time. Flowsheet Row Cardiac Rehab from 05/24/2021 in [facility] Cardiac and Pulmonary Rehab  Date 05/17/21  Educator SB  Instruction Review Code 1- Verbalizes Understanding    Intimacy: - Group verbal instruction through game format to discuss how heart and lung disease can affect sexual intimacy. Written material provided at class time.   Know Your Numbers and Heart Failure: - Group verbal and visual instruction to discuss disease risk factors for cardiac and pulmonary disease and treatment options.  Reviews associated critical values for Overweight/Obesity, Hypertension, Cholesterol, and Diabetes.  Discusses basics of heart failure: signs/symptoms and treatments.  Introduces Heart Failure Zone chart for action plan for heart failure. Written material provided at class time. Flowsheet Row Cardiac Rehab from 05/24/2021 in [facility] Cardiac and Pulmonary Rehab  Date 05/24/21  Educator SB  Instruction Review Code 1- Verbalizes Understanding    Infection Prevention: - Provides verbal and written material to individual with discussion of infection control including proper hand washing and proper equipment cleaning during exercise session. Flowsheet Row Cardiac Rehab from 07/01/2024 in [facility] Cardiac and Pulmonary Rehab  Date  02/19/24  Educator NT  Instruction Review Code 1- Verbalizes Understanding    Falls Prevention: - Provides verbal and written material to individual with discussion of falls prevention and safety. Flowsheet Row Cardiac Rehab from 07/01/2024 in [facility] Cardiac and Pulmonary Rehab  Date 02/19/24  Educator NT  Instruction Review  Code 1- Verbalizes Understanding    Other: -Provides group and verbal instruction on various topics (see comments)   Knowledge Questionnaire Score:  Knowledge Questionnaire Score - 06/26/24 0938       Knowledge Questionnaire Score   Pre Score 24/26    Post Score 24/26          Core Components/Risk Factors/Patient Goals at Admission:  Personal Goals and Risk Factors at Admission - 02/14/24 1035       Core Components/Risk Factors/Patient Goals on Admission    Weight Management Yes    Intervention Weight Management: Develop a combined nutrition and exercise program designed to reach desired caloric intake, while maintaining appropriate intake of nutrient and fiber, sodium and fats, and appropriate energy expenditure required for the weight goal.;Weight Management: Provide education and appropriate resources to help participant work on and attain dietary goals.;Weight Management/Obesity: Establish reasonable short term and long term weight goals.    Goal Weight: Long Term 185 lb (83.9 kg)    Expected Outcomes Short Term: Continue to assess and modify interventions until short term weight is achieved;Long Term: Adherence to nutrition and physical activity/exercise program aimed toward attainment of established weight goal;Weight Loss: Understanding of general recommendations for a balanced deficit meal plan, which promotes 1-2 lb weight loss per week and includes a negative energy balance of [id number] kcal/d;Understanding recommendations for meals to include 15-35% energy as protein, 25-35% energy from fat, 35-60% energy from carbohydrates, less than 200mg  of dietary  cholesterol, 20-35 gm of total fiber daily;Understanding of distribution of calorie intake throughout the day with the consumption of 4-5 meals/snacks    Diabetes Yes    Intervention Provide education about signs/symptoms and action to take for hypo/hyperglycemia.;Provide education about proper nutrition, including hydration, and aerobic/resistive exercise prescription along with prescribed medications to achieve blood glucose in normal ranges: Fasting glucose 65-99 mg/dL    Expected Outcomes Short Term: Participant verbalizes understanding of the signs/symptoms and immediate care of hyper/hypoglycemia, proper foot care and importance of medication, aerobic/resistive exercise and nutrition plan for blood glucose control.;Long Term: Attainment of HbA1C < 7%.    Hypertension Yes    Intervention Provide education on lifestyle modifcations including regular physical activity/exercise, weight management, moderate sodium restriction and increased consumption of fresh fruit, vegetables, and low fat dairy, alcohol moderation, and smoking cessation.;Monitor prescription use compliance.    Expected Outcomes Short Term: Continued assessment and intervention until BP is < 140/68mm HG in hypertensive participants. < 130/47mm HG in hypertensive participants with diabetes, heart failure or chronic kidney disease.;Long Term: Maintenance of blood pressure at goal levels.    Lipids Yes    Intervention Provide education and support for participant on nutrition & aerobic/resistive exercise along with prescribed medications to achieve LDL 70mg , HDL >40mg .    Expected Outcomes Short Term: Participant states understanding of desired cholesterol values and is compliant with medications prescribed. Participant is following exercise prescription and nutrition guidelines.;Long Term: Cholesterol controlled with medications as prescribed, with individualized exercise RX and with personalized nutrition plan. Value goals: LDL < 70mg ,  HDL > 40 mg.          Education:Diabetes - Individual verbal and written instruction to review signs/symptoms of diabetes, desired ranges of glucose level fasting, after meals and with exercise. Acknowledge that pre and post exercise glucose checks will be done for 3 sessions at entry of program.   Core Components/Risk Factors/Patient Goals Review:   Goals and Risk Factor Review     Row Name 03/16/24 0940 04/13/24  9072 06/01/24 9071 07/03/24 0936       Core Components/Risk Factors/Patient Goals Review   Personal Goals Review Hypertension;Diabetes Weight Management/Obesity;Diabetes Weight Management/Obesity Weight Management/Obesity;Hypertension;Diabetes    Review Dick reports he doesnt check his BP at home, but has started checking his BS more. Admits it is something he does not like doing and still working on making it a consisitent habit. Zakai is watching his sugar at home and had a bout with prednisone. He is around 150 fasting at the moment. He keeps close watch of his blood sugar. He would like to reduce his weight and reach a weight goal of 185 pounds. Ac has been limited since his back and knees are hurting. He wants to lose some weight but finds it hard to lose when he cannot move around like he wants to. He is getting shots in his knee to help him be more mobile. His weight goal is around 185lbs but would like to get to 200 lbs. Dick states that he has lost around 15 lbs since beginning in the program. He has a long term weight goal of 180 lb. He checks his BP at home only when he feels different than normal. We encouraged him to begin to checking his BP regularly when he graduates from the program. He is checking his blood glucose levels every other day and reports they have been well controlled and within normal ranges. He also reports taking all medications as prescribed.    Expected Outcomes STG: Check BS more consistently, take meds as prescribed. LTG: Manage risk factors  independently Short: lose a few pounds in the next few weeks. Long: reach weight goal. Short: continue to work on weight loss and lose a few pounds. Long: Reach weight goal. Short: Continue to work on weight loss through diet and exercise. Long: Continue to manage lifestyle risk factors.       Core Components/Risk Factors/Patient Goals at Discharge (Final Review):   Goals and Risk Factor Review - 07/03/24 0936       Core Components/Risk Factors/Patient Goals Review   Personal Goals Review Weight Management/Obesity;Hypertension;Diabetes    Review Dick states that he has lost around 15 lbs since beginning in the program. He has a long term weight goal of 180 lb. He checks his BP at home only when he feels different than normal. We encouraged him to begin to checking his BP regularly when he graduates from the program. He is checking his blood glucose levels every other day and reports they have been well controlled and within normal ranges. He also reports taking all medications as prescribed.    Expected Outcomes Short: Continue to work on weight loss through diet and exercise. Long: Continue to manage lifestyle risk factors.          ITP Comments:  ITP Comments     Row Name 02/14/24 1051 02/19/24 1135 02/21/24 0928 03/18/24 1455 04/15/24 1014   ITP Comments Initial phone call completed. Diagnosis can be found in [facility] 7/11. EP Orientation scheduled for Wednesday 8/27 at 9:30. Completed and gym orientation for cardiac rehab. Initial ITP created and sent for review to Dr. Oneil Pinal, Medical Director. First full day of exercise!  Patient was oriented to gym and equipment including functions, settings, policies, and procedures.  Patient's individual exercise prescription and treatment plan were reviewed.  All starting workloads were established based on the results of the 6 minute walk test done at initial orientation visit.  The plan for exercise progression  was also introduced and progression  will be customized based on patient's performance and goals. 30 Day review completed. Medical Director ITP review done; changes made as directed and signed approval by Medical Director. New to program. 30 Day review completed. Medical Director ITP review done; changes made as directed and signed approval by Medical Director.    Row Name 04/20/24 1545 05/13/24 9061 06/10/24 0804 07/06/24 0950     ITP Comments Dick called to inform staff that his MD wants him to take a three week break from cardiac rehab to do PT for his back. He will call back in three weeks to give staff update on when he can return. 30 Day review completed. Medical Director ITP review done; changes made as directed and signed approval by Medical Director. Patient has been out since last review. 30 Day review completed. Medical Director ITP review done, changes made as directed, and signed approval by Medical Director. Eldo graduated today from  rehab with 36 sessions completed.  Details of the patient's exercise prescription and what He needs to do in order to continue the prescription and progress were discussed with patient.  Patient was given a copy of prescription and goals.  Patient verbalized understanding. Donell plans to continue to exercise by riding his recumbent bike and possibly joining [employer].       Comments: discharge ITP    [1]  Current Outpatient Medications:    ALPRAZolam  (XANAX ) 0.5 MG tablet, Take 0.25 mg by mouth at bedtime as needed for sleep., Disp: , Rfl:    amLODipine  (NORVASC ) 5 MG tablet, TAKE 1 TABLET EVERY DAY, Disp: 90 tablet, Rfl: 3   apixaban  (ELIQUIS ) 5 MG TABS tablet, Take 1 tablet (5 mg total) by mouth 2 (two) times daily., Disp: 180 tablet, Rfl: 3   atorvastatin  (LIPITOR ) 40 MG tablet, Take 1 tablet (40 mg total) by mouth daily., Disp: 90 tablet, Rfl: 3   Cholecalciferol  250 MCG (10000 UT) TABS, Take 1,000 Units by mouth daily., Disp: , Rfl:    clopidogrel  (PLAVIX ) 75 MG tablet, Take  1 tablet (75 mg total) by mouth daily., Disp: 90 tablet, Rfl: 2   cyanocobalamin  1000 MCG tablet, Take 1,000 mcg by mouth daily., Disp: , Rfl:    Fe Fum-Vit C-Vit B12-FA (TRIGELS-F FORTE) CAPS capsule, Take 1 capsule by mouth daily after breakfast., Disp: 30 capsule, Rfl: 2   fluticasone  (FLONASE ) 50 MCG/ACT nasal spray, Place 2 sprays into both nostrils daily. , Disp: , Rfl:    losartan  (COZAAR ) 25 MG tablet, Take 1 tablet (25 mg total) by mouth daily., Disp: 90 tablet, Rfl: 3   melatonin 3 MG TABS tablet, Take 10 mg by mouth at bedtime., Disp: , Rfl:    metFORMIN  (GLUCOPHAGE ) 500 MG tablet, Take 500 mg by mouth 2 (two) times daily., Disp: , Rfl:    metoprolol  tartrate (LOPRESSOR ) 25 MG tablet, Take 1 tablet (25 mg total) by mouth 2 (two) times daily. (Patient taking differently: Take 25 mg by mouth daily.), Disp: 180 tablet, Rfl: 3   pantoprazole  (PROTONIX ) 40 MG tablet, Take 40 mg by mouth 2 (two) times daily before a meal., Disp: , Rfl:  [2]  Social History Tobacco Use  Smoking Status Never  Smokeless Tobacco Never

## 2024-07-06 NOTE — Progress Notes (Signed)
 Discharge Summary Patient: Daniel Holden DOB: 11-05-1945  Terri graduated today from  rehab with 36 sessions completed.  Details of the patient's exercise prescription and what He needs to do in order to continue the prescription and progress were discussed with patient.  Patient was given a copy of prescription and goals.  Patient verbalized understanding. Barth plans to continue to exercise by riding his recumbent bike and possibly joining [employer].   6 Minute Walk     Row Name 02/19/24 1143 06/24/24 0935       6 Minute Walk   Phase Initial Discharge    Distance 1195 feet 1415 feet    Distance % Change -- 18.41 %    Distance Feet Change -- 220 ft    Walk Time 6 minutes 6 minutes    # of Rest Breaks 0 0    MPH 2.26 2.68    METS 2.26 2.7    RPE 10 13    Perceived Dyspnea  1 0    VO2 Peak 7.92 9.45    Symptoms No No    Resting HR 76 bpm 77 bpm    Resting BP 142/68 130/60    Resting Oxygen Saturation  96 % 96 %    Exercise Oxygen Saturation  during 6 min walk 96 % 97 %    Max Ex. HR 96 bpm 100 bpm    Max Ex. BP 160/72 160/60    2 Minute Post BP 154/72 150/70

## 2024-07-06 NOTE — Progress Notes (Signed)
 Daily Session Note  Patient Details  Name: Daniel Holden MRN: [id number] Date of Birth: 07/14/1945 Referring Provider:   Flowsheet Row Cardiac Rehab from 02/19/2024 in [facility]  Referring Provider Dr. Wilbert Bihari, MD    Encounter Date: 07/06/2024  Check In:  Session Check In - 07/06/24 0948       Check-In   Supervising physician immediately available to respond to emergencies See telemetry face sheet for immediately available ER MD    Location [facility]-Cardiac & Pulmonary Rehab    Staff Present Burnard Davenport RN,BSN,MPA;[facility] RCP,RRT,BSRT;Maxon Burnell BS, Exercise Physiologist;Lennox Dolberry Dyane BS, ACSM CEP, Exercise Physiologist    Virtual Visit No    Medication changes reported     No    Fall or balance concerns reported    No    Tobacco Cessation No Change    Warm-up and Cool-down Performed on first and last piece of equipment    Resistance Training Performed Yes    VAD Patient? No    PAD/SET Patient? No      Pain Assessment   Currently in Pain? No/denies             Tobacco Use History[1]  Goals Met:  Proper associated with RPD/PD & O2 Sat Independence with exercise equipment Exercise tolerated well No report of concerns or symptoms today Strength training completed today  Goals Unmet:  Not Applicable  Comments:  Daniel Holden graduated today from  rehab with 36 sessions completed.  Details of the patient's exercise prescription and what He needs to do in order to continue the prescription and progress were discussed with patient.  Patient was given a copy of prescription and goals.  Patient verbalized understanding. Daniel Holden plans to continue to exercise by riding his recumbent bike and possibly joining [employer].    Dr. Oneil Pinal is Medical Director for [facility] Cardiac Rehabilitation.  Dr. Fuad Aleskerov is Medical Director for [facility] Pulmonary Rehabilitation.    [1]  Social History Tobacco Use  Smoking Status Never   Smokeless Tobacco Never

## 2024-07-07 ENCOUNTER — Ambulatory Visit
Admission: RE | Admit: 2024-07-07 | Discharge: 2024-07-07 | Disposition: A | Discharge status: Home / Self Care | Source: Ambulatory Visit | Attending: Internal Medicine | Admitting: Internal Medicine

## 2024-07-07 DIAGNOSIS — R972 Elevated prostate specific antigen [PSA]: Secondary | ICD-10-CM | POA: Diagnosis present

## 2024-07-07 MED ORDER — GADOBUTROL 1 MMOL/ML IV SOLN
9.0000 mL | Freq: Once | INTRAVENOUS | Status: AC | PRN
  Administered 2024-07-07: 9 mL via INTRAVENOUS

## 2024-07-08 ENCOUNTER — Encounter

## 2024-07-15 ENCOUNTER — Encounter: Payer: Self-pay | Admitting: Cardiology

## 2024-07-22 DIAGNOSIS — R972 Elevated prostate specific antigen [PSA]: Secondary | ICD-10-CM | POA: Insufficient documentation

## 2024-07-22 NOTE — Assessment & Plan Note (Addendum)
 PSA 5.1 (Dec 2025)   - 2-4 baseline since 2023   - cardiac comorbidity  MRI prostate (07/07/24) - PIRADS 4 at Right mid/base PZ, PIRADS 4 at Left anterior mid TZ. 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  Quite firm at mid/ Left apex, either adjacent nodular disease or 1 firm lesion  I had a thoughtful conversation with him today.  Considering his age and competing comorbidity, this is more shared decision making regarding aggressive workup versus surveillance/watchful waiting.  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  This does not necessarily mean he will need to undergo aggressive treatment.  Alternatively, I did review the role of close PSA surveillance.  He was aligned with biopsy as next step.  He will need to receive cardiac clearance to hold his Plavix  5 days prior, Eliquis  48 hours prior.  I would be fine with baby aspirin  monotherapy through biopsy procedure if needed..   - Schedule MRI fusion biopsy, next available -Needs cardiac clearance: Hold Plavix  5 days prior, hold Eliquis  48 hours prior.  I am fine with ASA 81 monotherapy through procedure if needed

## 2024-07-28 ENCOUNTER — Encounter: Payer: Self-pay | Admitting: Cardiology

## 2024-07-29 ENCOUNTER — Encounter: Payer: Self-pay | Admitting: Urology

## 2024-07-29 ENCOUNTER — Ambulatory Visit (INDEPENDENT_AMBULATORY_CARE_PROVIDER_SITE_OTHER): Admitting: Urology

## 2024-07-29 VITALS — BP 138/75 | HR 80 | Ht 66.0 in | Wt 204.0 lb

## 2024-07-29 DIAGNOSIS — N529 Male erectile dysfunction, unspecified: Secondary | ICD-10-CM | POA: Diagnosis not present

## 2024-07-29 DIAGNOSIS — R972 Elevated prostate specific antigen [PSA]: Secondary | ICD-10-CM | POA: Diagnosis not present

## 2024-07-29 DIAGNOSIS — N401 Enlarged prostate with lower urinary tract symptoms: Secondary | ICD-10-CM

## 2024-07-29 DIAGNOSIS — N400 Enlarged prostate without lower urinary tract symptoms: Secondary | ICD-10-CM | POA: Insufficient documentation

## 2024-07-29 LAB — BLADDER SCAN AMB NON-IMAGING

## 2024-07-29 NOTE — Assessment & Plan Note (Addendum)
 PVR 20cc today  IPSS 9/2   Today we reviewed the physiology and common causes of male lower urinary tract symptoms (LUTS). Discussed potential etiologies including infectious, inflammatory, bladder-related, benign prostatic hyperplasia (BPH), and musculoskeletal/pelvic floor contributions. Reviewed the standard diagnostic workup (urinalysis, PVR, uroflow, prostate assessment, possible cystoscopy or imaging) and the spectrum of initial management strategies ranging from behavioral and lifestyle measures to pharmacologic therapy, with procedural options if indicated. All questions were addressed and the patient expressed understanding of the evaluation and treatment pathway.  -Overall minimally bothersome LUTS.  Content with current urinary habits.  He does not require any additional therapy or medications at this time

## 2024-07-29 NOTE — Assessment & Plan Note (Signed)
 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  We will work up PSA as above and then revisit this

## 2024-07-29 NOTE — Patient Instructions (Signed)
 Transrectal Prostate Biopsy/Fusion Biopsy Patient Education and Post Procedure Instructions    -Definition A prostate biopsy is the removal of a small amount of tissue from the prostate gland. The tissue is examined to determine whether there is cancer.  -Reasons for Procedure A prostate biopsy is usually done after an abnormal finding by: Digital rectal exam Prostate specific antigen (PSA) blood test A prostate biopsy is the only way to find out if cancer cells are present.  -Possible Complications Problems from the procedure are rare, but all procedures have some risk including: Infection Bruising or lengthy bleeding from the rectum, or in urine or semen Difficulty urinating Reactions to anesthesia Factors that may increase the risk of complications include: Smoking History of bleeding disorders or easy bruising Use of any medications, over-the-counter medications, or herbal supplements Sensitivity or allergy to latex, medications, or anesthesia.  -Prior to Procedure Talk to your doctor about your medications. Blood thinning medications including aspirin should be stopped 1 week prior to procedure. If prescribed by your cardiologist we may need approval before stopping medications. Use a Fleets enema 2 hours before the procedure. Can be purchased at your pharmacy. Antibiotics will be administered in the clinic prior to procedure.  Please make sure you eat a light meal prior to coming in for your appointment. This can help prevent lightheadedness during the procedure and upset stomach from antibiotics. Please bring someone with you to the procedure to drive you home.  -Anesthesia Transrectal biopsy: Local anesthesia--Just the area that is being operated on is numbed using an injectable anesthetic.  -Description of the Procedure Transrectal biopsy--Your doctor will insert a small ultrasound device into the rectum. This device will produce sound waves to create an image of the  prostate. These images will help guide placement of the needle. Your doctor will then insert the needle through the wall of the rectum and into the prostate gland. The procedure should take approximately 15-30 minutes.  -Will It Hurt? You may have discomfort and soreness at the biopsy site. Pain and discomfort after the procedure can be managed with medications.  -Postoperative Care When you return home after the procedure, do the following to help ensure a smooth recovery: Stay hydrated. Drink plenty of fluids for the next few days. Avoid difficult physical activity the day and evening of the procedure. Keep in mind that you may see blood in your urine, stool, or semen for several days. Resume any medications that were stopped when you are advised to do so.  After the sample is taken, it will be sent to a pathologist for examination under a microscope. This doctor will analyze the sample for cancer. You will be scheduled for an appointment to discuss results. If cancer is present, your doctor will work with you to develop a treatment plan.   -Call Your Doctor or Seek Immediate Medical Attention It is important to monitor your recovery. Alert your doctor to any problems. If any of the following occur, call your doctor or go to the emergency room: Fever 100.5 or greater within 1 week post procedure go directly to ER Call the office for: Blood in the urine more than 1 week or in semen for more than 6 weeks post-biopsy Pain that you cannot control with the medications you have been given Pain, burning, urgency, or frequency of urination Cough, shortness of breath, or chest pain- if severe go to ER Heavy rectal bleeding or bleeding that lasts more than 1 week after the biopsy If you have  any questions or concerns please contact our office at [phone]  [facility]  [address]  [place] , [place], [address] [phone]

## 2024-09-10 ENCOUNTER — Other Ambulatory Visit: Admitting: Urology

## 2024-11-19 ENCOUNTER — Ambulatory Visit: Admitting: General Practice
